# Patient Record
Sex: Male | Born: 1955 | Race: Black or African American | Hispanic: No | Marital: Single | Smoking: Current some day smoker
Health system: Southern US, Community
[De-identification: ages and names within clinical notes are randomized; demographics above are authoritative.]

## PROBLEM LIST (undated history)

## (undated) ENCOUNTER — Emergency Department (HOSPITAL_COMMUNITY): Admission: EM | Disposition: A | Discharge status: Other Acute Inpatient Hospital | Payer: Medicaid Other

## (undated) DIAGNOSIS — F1410 Cocaine abuse, uncomplicated: Secondary | ICD-10-CM

## (undated) DIAGNOSIS — I2510 Atherosclerotic heart disease of native coronary artery without angina pectoris: Secondary | ICD-10-CM

## (undated) DIAGNOSIS — Z720 Tobacco use: Secondary | ICD-10-CM

## (undated) DIAGNOSIS — C801 Malignant (primary) neoplasm, unspecified: Secondary | ICD-10-CM

## (undated) DIAGNOSIS — I255 Ischemic cardiomyopathy: Secondary | ICD-10-CM

## (undated) HISTORY — PX: CARDIAC SURGERY: SHX584

## (undated) HISTORY — PX: BYPASS GRAFT: SHX909

---

## 1999-02-25 ENCOUNTER — Encounter: Payer: Self-pay | Admitting: Internal Medicine

## 1999-02-25 ENCOUNTER — Ambulatory Visit (HOSPITAL_COMMUNITY): Admission: RE | Admit: 1999-02-25 | Discharge: 1999-02-25 | Payer: Self-pay | Admitting: Internal Medicine

## 1999-04-09 ENCOUNTER — Ambulatory Visit (HOSPITAL_COMMUNITY): Admission: RE | Admit: 1999-04-09 | Discharge: 1999-04-09 | Payer: Self-pay | Admitting: *Deleted

## 2003-01-16 ENCOUNTER — Ambulatory Visit (HOSPITAL_COMMUNITY): Admission: RE | Admit: 2003-01-16 | Discharge: 2003-01-16 | Payer: Self-pay | Admitting: Internal Medicine

## 2003-01-16 ENCOUNTER — Encounter: Payer: Self-pay | Admitting: Internal Medicine

## 2004-02-01 ENCOUNTER — Ambulatory Visit: Payer: Self-pay | Admitting: Nurse Practitioner

## 2004-02-29 ENCOUNTER — Ambulatory Visit: Payer: Self-pay | Admitting: Nurse Practitioner

## 2004-03-16 ENCOUNTER — Ambulatory Visit: Payer: Self-pay | Admitting: Nurse Practitioner

## 2004-12-19 ENCOUNTER — Ambulatory Visit: Payer: Self-pay | Admitting: Nurse Practitioner

## 2006-04-23 ENCOUNTER — Ambulatory Visit: Payer: Self-pay | Admitting: Nurse Practitioner

## 2007-05-17 ENCOUNTER — Ambulatory Visit: Payer: Self-pay | Admitting: Internal Medicine

## 2007-07-10 ENCOUNTER — Ambulatory Visit: Payer: Self-pay | Admitting: Internal Medicine

## 2007-12-09 ENCOUNTER — Ambulatory Visit: Payer: Self-pay | Admitting: Internal Medicine

## 2007-12-09 LAB — CONVERTED CEMR LAB
ALT: 11 units/L (ref 0–53)
BUN: 14 mg/dL (ref 6–23)
CO2: 24 meq/L (ref 19–32)
Calcium: 9.2 mg/dL (ref 8.4–10.5)
Chloride: 105 meq/L (ref 96–112)
Cholesterol: 214 mg/dL — ABNORMAL HIGH (ref 0–200)
Cocaine Metabolites: POSITIVE — AB
Creatinine, Ser: 1.12 mg/dL (ref 0.40–1.50)
Creatinine,U: 187.3 mg/dL
Glucose, Bld: 85 mg/dL (ref 70–99)
Hemoglobin: 14.3 g/dL (ref 13.0–17.0)
Lymphocytes Relative: 41 % (ref 12–46)
Lymphs Abs: 1.1 10*3/uL (ref 0.7–4.0)
Monocytes Absolute: 0.3 10*3/uL (ref 0.1–1.0)
Monocytes Relative: 10 % (ref 3–12)
Neutro Abs: 1.3 10*3/uL — ABNORMAL LOW (ref 1.7–7.7)
Opiate Screen, Urine: NEGATIVE
Phencyclidine (PCP): NEGATIVE
RBC: 4.6 M/uL (ref 4.22–5.81)
Total CHOL/HDL Ratio: 2.9

## 2007-12-18 ENCOUNTER — Ambulatory Visit: Payer: Self-pay | Admitting: Internal Medicine

## 2008-01-01 LAB — COMPREHENSIVE METABOLIC PANEL
ALT: 10 U/L (ref 0–53)
AST: 12 U/L (ref 0–37)
Albumin: 4 g/dL (ref 3.5–5.2)
Alkaline Phosphatase: 68 U/L (ref 39–117)
BUN: 11 mg/dL (ref 6–23)
CO2: 25 mEq/L (ref 19–32)
Calcium: 9.4 mg/dL (ref 8.4–10.5)
Chloride: 107 mEq/L (ref 96–112)
Creatinine, Ser: 0.97 mg/dL (ref 0.40–1.50)
Glucose, Bld: 104 mg/dL — ABNORMAL HIGH (ref 70–99)
Potassium: 4 mEq/L (ref 3.5–5.3)
Sodium: 141 mEq/L (ref 135–145)
Total Bilirubin: 0.5 mg/dL (ref 0.3–1.2)
Total Protein: 6.6 g/dL (ref 6.0–8.3)

## 2008-01-01 LAB — CBC WITH DIFFERENTIAL/PLATELET
BASO%: 0.5 % (ref 0.0–2.0)
Basophils Absolute: 0 10*3/uL (ref 0.0–0.1)
EOS%: 3 % (ref 0.0–7.0)
Eosinophils Absolute: 0.1 10*3/uL (ref 0.0–0.5)
HCT: 38.7 % (ref 38.7–49.9)
HGB: 13.4 g/dL (ref 13.0–17.1)
LYMPH%: 33.9 % (ref 14.0–48.0)
MCH: 32.8 pg (ref 28.0–33.4)
MCHC: 34.7 g/dL (ref 32.0–35.9)
MCV: 94.5 fL (ref 81.6–98.0)
MONO#: 0.2 10*3/uL (ref 0.1–0.9)
MONO%: 7 % (ref 0.0–13.0)
NEUT#: 1.8 10*3/uL (ref 1.5–6.5)
NEUT%: 55.6 % (ref 40.0–75.0)
Platelets: 200 10*3/uL (ref 145–400)
RBC: 4.1 10*6/uL — ABNORMAL LOW (ref 4.20–5.71)
RDW: 13.9 % (ref 11.2–14.6)
WBC: 3.3 10*3/uL — ABNORMAL LOW (ref 4.0–10.0)
lymph#: 1.1 10*3/uL (ref 0.9–3.3)

## 2008-01-01 LAB — CHCC SMEAR

## 2008-01-01 LAB — LACTATE DEHYDROGENASE: LDH: 121 U/L (ref 94–250)

## 2008-02-07 ENCOUNTER — Ambulatory Visit: Payer: Self-pay | Admitting: Internal Medicine

## 2008-04-03 ENCOUNTER — Ambulatory Visit: Payer: Self-pay | Admitting: Internal Medicine

## 2008-04-14 ENCOUNTER — Ambulatory Visit: Payer: Self-pay | Admitting: Internal Medicine

## 2008-04-14 ENCOUNTER — Encounter (INDEPENDENT_AMBULATORY_CARE_PROVIDER_SITE_OTHER): Payer: Self-pay | Admitting: Internal Medicine

## 2008-04-14 LAB — CONVERTED CEMR LAB
Alkaline Phosphatase: 65 units/L (ref 39–117)
Indirect Bilirubin: 0.4 mg/dL (ref 0.0–0.9)
Total Bilirubin: 0.5 mg/dL (ref 0.3–1.2)
Total Protein: 7.3 g/dL (ref 6.0–8.3)

## 2008-04-21 ENCOUNTER — Ambulatory Visit: Payer: Self-pay | Admitting: Cardiology

## 2008-04-21 LAB — CONVERTED CEMR LAB
BUN: 15 mg/dL (ref 6–23)
Basophils Relative: 0.5 % (ref 0.0–3.0)
Creatinine, Ser: 0.8 mg/dL (ref 0.4–1.5)
Eosinophils Absolute: 0.1 10*3/uL (ref 0.0–0.7)
Eosinophils Relative: 2.6 % (ref 0.0–5.0)
GFR calc Af Amer: 131 mL/min
GFR calc non Af Amer: 108 mL/min
HCT: 41.5 % (ref 39.0–52.0)
Hemoglobin: 14.6 g/dL (ref 13.0–17.0)
MCV: 95 fL (ref 78.0–100.0)
Monocytes Absolute: 0.3 10*3/uL (ref 0.1–1.0)
Neutro Abs: 2.1 10*3/uL (ref 1.4–7.7)
RBC: 4.37 M/uL (ref 4.22–5.81)
WBC: 4.1 10*3/uL — ABNORMAL LOW (ref 4.5–10.5)

## 2008-04-29 ENCOUNTER — Ambulatory Visit: Payer: Self-pay | Admitting: Vascular Surgery

## 2008-04-29 ENCOUNTER — Encounter: Payer: Self-pay | Admitting: Thoracic Surgery (Cardiothoracic Vascular Surgery)

## 2008-04-29 ENCOUNTER — Inpatient Hospital Stay (HOSPITAL_COMMUNITY): Admission: AD | Admit: 2008-04-29 | Discharge: 2008-05-07 | Payer: Self-pay | Admitting: Cardiology

## 2008-04-29 ENCOUNTER — Ambulatory Visit: Payer: Self-pay | Admitting: Cardiology

## 2008-04-29 ENCOUNTER — Ambulatory Visit: Payer: Self-pay | Admitting: Thoracic Surgery (Cardiothoracic Vascular Surgery)

## 2008-04-30 DIAGNOSIS — I25810 Atherosclerosis of coronary artery bypass graft(s) without angina pectoris: Secondary | ICD-10-CM

## 2008-05-28 ENCOUNTER — Ambulatory Visit: Payer: Self-pay | Admitting: Thoracic Surgery (Cardiothoracic Vascular Surgery)

## 2008-05-28 ENCOUNTER — Encounter
Admission: RE | Admit: 2008-05-28 | Discharge: 2008-05-28 | Payer: Self-pay | Admitting: Thoracic Surgery (Cardiothoracic Vascular Surgery)

## 2008-06-16 DIAGNOSIS — F17200 Nicotine dependence, unspecified, uncomplicated: Secondary | ICD-10-CM | POA: Insufficient documentation

## 2008-06-16 DIAGNOSIS — F1210 Cannabis abuse, uncomplicated: Secondary | ICD-10-CM | POA: Insufficient documentation

## 2008-06-16 DIAGNOSIS — E785 Hyperlipidemia, unspecified: Secondary | ICD-10-CM | POA: Insufficient documentation

## 2008-06-16 DIAGNOSIS — F1410 Cocaine abuse, uncomplicated: Secondary | ICD-10-CM

## 2008-08-13 ENCOUNTER — Ambulatory Visit: Payer: Self-pay | Admitting: Internal Medicine

## 2008-09-11 ENCOUNTER — Telehealth (INDEPENDENT_AMBULATORY_CARE_PROVIDER_SITE_OTHER): Payer: Self-pay | Admitting: *Deleted

## 2008-10-14 ENCOUNTER — Encounter (INDEPENDENT_AMBULATORY_CARE_PROVIDER_SITE_OTHER): Payer: Self-pay | Admitting: *Deleted

## 2009-01-19 ENCOUNTER — Ambulatory Visit: Payer: Self-pay | Admitting: Internal Medicine

## 2009-01-19 ENCOUNTER — Encounter (INDEPENDENT_AMBULATORY_CARE_PROVIDER_SITE_OTHER): Payer: Self-pay | Admitting: Internal Medicine

## 2009-01-19 LAB — CONVERTED CEMR LAB
AST: 12 units/L (ref 0–37)
Albumin: 4.1 g/dL (ref 3.5–5.2)
Alkaline Phosphatase: 60 units/L (ref 39–117)
BUN: 15 mg/dL (ref 6–23)
Basophils Absolute: 0 10*3/uL (ref 0.0–0.1)
Basophils Relative: 1 % (ref 0–1)
Eosinophils Relative: 2 % (ref 0–5)
Lymphocytes Relative: 37 % (ref 12–46)
Neutro Abs: 1.6 10*3/uL — ABNORMAL LOW (ref 1.7–7.7)
Platelets: 213 10*3/uL (ref 150–400)
Potassium: 4.5 meq/L (ref 3.5–5.3)
RDW: 13.5 % (ref 11.5–15.5)
Sodium: 141 meq/L (ref 135–145)
Total Bilirubin: 0.5 mg/dL (ref 0.3–1.2)

## 2009-02-05 ENCOUNTER — Ambulatory Visit: Payer: Self-pay | Admitting: Internal Medicine

## 2009-02-12 ENCOUNTER — Ambulatory Visit: Payer: Self-pay | Admitting: Internal Medicine

## 2009-02-15 ENCOUNTER — Encounter (INDEPENDENT_AMBULATORY_CARE_PROVIDER_SITE_OTHER): Payer: Self-pay | Admitting: *Deleted

## 2010-03-15 ENCOUNTER — Encounter (INDEPENDENT_AMBULATORY_CARE_PROVIDER_SITE_OTHER): Payer: Self-pay | Admitting: *Deleted

## 2010-03-15 LAB — CONVERTED CEMR LAB
Albumin: 4.2 g/dL (ref 3.5–5.2)
Alkaline Phosphatase: 61 units/L (ref 39–117)
BUN: 12 mg/dL (ref 6–23)
CO2: 27 meq/L (ref 19–32)
Calcium: 9.8 mg/dL (ref 8.4–10.5)
Chloride: 106 meq/L (ref 96–112)
Cholesterol: 240 mg/dL — ABNORMAL HIGH (ref 0–200)
Glucose, Bld: 92 mg/dL (ref 70–99)
HDL: 72 mg/dL (ref >39)
Potassium: 4.2 meq/L (ref 3.5–5.3)
Total Protein: 7 g/dL (ref 6.0–8.3)
Triglycerides: 97 mg/dL (ref <150)

## 2010-07-12 LAB — POCT I-STAT 4, (NA,K, GLUC, HGB,HCT)
Glucose, Bld: 103 mg/dL — ABNORMAL HIGH (ref 70–99)
Glucose, Bld: 107 mg/dL — ABNORMAL HIGH (ref 70–99)
Glucose, Bld: 117 mg/dL — ABNORMAL HIGH (ref 70–99)
Glucose, Bld: 98 mg/dL (ref 70–99)
Glucose, Bld: 98 mg/dL (ref 70–99)
HCT: 24 % — ABNORMAL LOW (ref 39.0–52.0)
HCT: 26 % — ABNORMAL LOW (ref 39.0–52.0)
HCT: 33 % — ABNORMAL LOW (ref 39.0–52.0)
HCT: 34 % — ABNORMAL LOW (ref 39.0–52.0)
Hemoglobin: 11.2 g/dL — ABNORMAL LOW (ref 13.0–17.0)
Hemoglobin: 8.8 g/dL — ABNORMAL LOW (ref 13.0–17.0)
Potassium: 3.7 mEq/L (ref 3.5–5.1)
Potassium: 4.5 mEq/L (ref 3.5–5.1)
Potassium: 5 mEq/L (ref 3.5–5.1)
Sodium: 140 mEq/L (ref 135–145)
Sodium: 142 mEq/L (ref 135–145)

## 2010-07-12 LAB — POCT I-STAT 3, ART BLOOD GAS (G3+)
Acid-Base Excess: 1 mmol/L (ref 0.0–2.0)
Acid-base deficit: 2 mmol/L (ref 0.0–2.0)
Acid-base deficit: 3 mmol/L — ABNORMAL HIGH (ref 0.0–2.0)
Acid-base deficit: 5 mmol/L — ABNORMAL HIGH (ref 0.0–2.0)
Bicarbonate: 21.8 mEq/L (ref 20.0–24.0)
Bicarbonate: 23.5 mEq/L (ref 20.0–24.0)
O2 Saturation: 100 %
O2 Saturation: 98 %
O2 Saturation: 99 %
Patient temperature: 36.2
Patient temperature: 37
Patient temperature: 37.6
TCO2: 22 mmol/L (ref 0–100)
TCO2: 23 mmol/L (ref 0–100)
TCO2: 27 mmol/L (ref 0–100)
pCO2 arterial: 42.2 mmHg (ref 35.0–45.0)
pO2, Arterial: 158 mmHg — ABNORMAL HIGH (ref 80.0–100.0)
pO2, Arterial: 277 mmHg — ABNORMAL HIGH (ref 80.0–100.0)

## 2010-07-12 LAB — MAGNESIUM
Magnesium: 2.2 mg/dL (ref 1.5–2.5)
Magnesium: 2.3 mg/dL (ref 1.5–2.5)

## 2010-07-12 LAB — BASIC METABOLIC PANEL
BUN: 12 mg/dL (ref 6–23)
BUN: 17 mg/dL (ref 6–23)
BUN: 8 mg/dL (ref 6–23)
CO2: 28 mEq/L (ref 19–32)
Calcium: 8 mg/dL — ABNORMAL LOW (ref 8.4–10.5)
Calcium: 8.4 mg/dL (ref 8.4–10.5)
Calcium: 8.8 mg/dL (ref 8.4–10.5)
Calcium: 9.1 mg/dL (ref 8.4–10.5)
Chloride: 101 mEq/L (ref 96–112)
Chloride: 110 mEq/L (ref 96–112)
Creatinine, Ser: 0.8 mg/dL (ref 0.4–1.5)
Creatinine, Ser: 0.83 mg/dL (ref 0.4–1.5)
Creatinine, Ser: 0.84 mg/dL (ref 0.4–1.5)
Creatinine, Ser: 0.86 mg/dL (ref 0.4–1.5)
Creatinine, Ser: 1.02 mg/dL (ref 0.4–1.5)
GFR calc Af Amer: >60 mL/min (ref >60)
GFR calc Af Amer: >60 mL/min (ref >60)
GFR calc Af Amer: >60 mL/min (ref >60)
GFR calc non Af Amer: >60 mL/min (ref >60)
GFR calc non Af Amer: >60 mL/min (ref >60)
GFR calc non Af Amer: >60 mL/min (ref >60)
GFR calc non Af Amer: >60 mL/min (ref >60)
Glucose, Bld: 102 mg/dL — ABNORMAL HIGH (ref 70–99)
Glucose, Bld: 116 mg/dL — ABNORMAL HIGH (ref 70–99)
Potassium: 3.5 mEq/L (ref 3.5–5.1)
Potassium: 3.6 mEq/L (ref 3.5–5.1)
Sodium: 138 mEq/L (ref 135–145)
Sodium: 139 mEq/L (ref 135–145)

## 2010-07-12 LAB — COMPREHENSIVE METABOLIC PANEL
AST: 16 U/L (ref 0–37)
Albumin: 3 g/dL — ABNORMAL LOW (ref 3.5–5.2)
Alkaline Phosphatase: 54 U/L (ref 39–117)
BUN: 17 mg/dL (ref 6–23)
CO2: 26 mEq/L (ref 19–32)
Chloride: 106 mEq/L (ref 96–112)
Creatinine, Ser: 1.02 mg/dL (ref 0.4–1.5)
GFR calc Af Amer: >60 mL/min (ref >60)
GFR calc non Af Amer: >60 mL/min (ref >60)
Potassium: 3.5 mEq/L (ref 3.5–5.1)
Total Bilirubin: 0.7 mg/dL (ref 0.3–1.2)

## 2010-07-12 LAB — CBC
HCT: 31.3 % — ABNORMAL LOW (ref 39.0–52.0)
HCT: 32 % — ABNORMAL LOW (ref 39.0–52.0)
HCT: 35.5 % — ABNORMAL LOW (ref 39.0–52.0)
HCT: 36.1 % — ABNORMAL LOW (ref 39.0–52.0)
Hemoglobin: 10.5 g/dL — ABNORMAL LOW (ref 13.0–17.0)
Hemoglobin: 11.1 g/dL — ABNORMAL LOW (ref 13.0–17.0)
Hemoglobin: 12.1 g/dL — ABNORMAL LOW (ref 13.0–17.0)
MCHC: 34 g/dL (ref 30.0–36.0)
MCHC: 34.5 g/dL (ref 30.0–36.0)
MCHC: 34.6 g/dL (ref 30.0–36.0)
MCV: 94.9 fL (ref 78.0–100.0)
MCV: 96 fL (ref 78.0–100.0)
MCV: 96.3 fL (ref 78.0–100.0)
MCV: 96.4 fL (ref 78.0–100.0)
MCV: 96.4 fL (ref 78.0–100.0)
Platelets: 104 10*3/uL — ABNORMAL LOW (ref 150–400)
Platelets: 153 10*3/uL (ref 150–400)
Platelets: 94 10*3/uL — ABNORMAL LOW (ref 150–400)
Platelets: 96 10*3/uL — ABNORMAL LOW (ref 150–400)
RBC: 3.11 MIL/uL — ABNORMAL LOW (ref 4.22–5.81)
RBC: 3.21 MIL/uL — ABNORMAL LOW (ref 4.22–5.81)
RBC: 3.25 MIL/uL — ABNORMAL LOW (ref 4.22–5.81)
RBC: 3.32 MIL/uL — ABNORMAL LOW (ref 4.22–5.81)
RBC: 3.68 MIL/uL — ABNORMAL LOW (ref 4.22–5.81)
RBC: 3.81 MIL/uL — ABNORMAL LOW (ref 4.22–5.81)
RDW: 13 % (ref 11.5–15.5)
RDW: 13.1 % (ref 11.5–15.5)
RDW: 13.2 % (ref 11.5–15.5)
RDW: 13.3 % (ref 11.5–15.5)
WBC: 3.7 10*3/uL — ABNORMAL LOW (ref 4.0–10.5)
WBC: 7.7 10*3/uL (ref 4.0–10.5)
WBC: 8.3 10*3/uL (ref 4.0–10.5)
WBC: 8.7 10*3/uL (ref 4.0–10.5)

## 2010-07-12 LAB — BLOOD GAS, ARTERIAL
Acid-Base Excess: 0.6 mmol/L (ref 0.0–2.0)
Bicarbonate: 24.5 mEq/L — ABNORMAL HIGH (ref 20.0–24.0)
TCO2: 25.7 mmol/L (ref 0–100)
pCO2 arterial: 38.5 mmHg (ref 35.0–45.0)
pH, Arterial: 7.42 (ref 7.350–7.450)
pO2, Arterial: 81.1 mmHg (ref 80.0–100.0)

## 2010-07-12 LAB — TYPE AND SCREEN: Antibody Screen: NEGATIVE

## 2010-07-12 LAB — POCT I-STAT, CHEM 8
Calcium, Ion: 1.18 mmol/L (ref 1.12–1.32)
HCT: 32 % — ABNORMAL LOW (ref 39.0–52.0)
Hemoglobin: 10.9 g/dL — ABNORMAL LOW (ref 13.0–17.0)
Sodium: 139 mEq/L (ref 135–145)
TCO2: 28 mmol/L (ref 0–100)

## 2010-07-12 LAB — POCT I-STAT 3, VENOUS BLOOD GAS (G3P V)
TCO2: 25 mmol/L (ref 0–100)
pCO2, Ven: 32.2 mmHg — ABNORMAL LOW (ref 45.0–50.0)
pH, Ven: 7.452 — ABNORMAL HIGH (ref 7.250–7.300)
pO2, Ven: 35 mmHg (ref 30.0–45.0)

## 2010-07-12 LAB — GLUCOSE, CAPILLARY
Glucose-Capillary: 100 mg/dL — ABNORMAL HIGH (ref 70–99)
Glucose-Capillary: 103 mg/dL — ABNORMAL HIGH (ref 70–99)
Glucose-Capillary: 104 mg/dL — ABNORMAL HIGH (ref 70–99)
Glucose-Capillary: 108 mg/dL — ABNORMAL HIGH (ref 70–99)
Glucose-Capillary: 110 mg/dL — ABNORMAL HIGH (ref 70–99)
Glucose-Capillary: 111 mg/dL — ABNORMAL HIGH (ref 70–99)
Glucose-Capillary: 116 mg/dL — ABNORMAL HIGH (ref 70–99)
Glucose-Capillary: 83 mg/dL (ref 70–99)
Glucose-Capillary: 99 mg/dL (ref 70–99)

## 2010-07-12 LAB — ABO/RH: ABO/RH(D): O POS

## 2010-07-12 LAB — CREATININE, SERUM
Creatinine, Ser: 0.87 mg/dL (ref 0.4–1.5)
GFR calc Af Amer: >60 mL/min (ref >60)
GFR calc Af Amer: >60 mL/min (ref >60)
GFR calc non Af Amer: >60 mL/min (ref >60)

## 2010-07-12 LAB — URINALYSIS, ROUTINE W REFLEX MICROSCOPIC
Bilirubin Urine: NEGATIVE
Hgb urine dipstick: NEGATIVE
Ketones, ur: NEGATIVE mg/dL
Specific Gravity, Urine: 1.033 — ABNORMAL HIGH (ref 1.005–1.030)
pH: 6.5 (ref 5.0–8.0)

## 2010-07-12 LAB — PLATELET COUNT: Platelets: 111 10*3/uL — ABNORMAL LOW (ref 150–400)

## 2010-07-12 LAB — APTT: aPTT: 39 seconds — ABNORMAL HIGH (ref 24–37)

## 2010-08-09 NOTE — Op Note (Signed)
NAMECHARELS, STAMBAUGH               ACCOUNT NO.:  [id number]   MEDICAL RECORD NO.:  [id number]          PATIENT TYPE:  INP   LOCATION:  2316                         FACILITY:  [facility]   PHYSICIAN:  Salvatore Decent. Dorris Fetch, M.D.DATE OF BIRTH:  April 12, 1955   DATE OF PROCEDURE:  04/30/2008  DATE OF DISCHARGE:                               OPERATIVE REPORT   PREOPERATIVE DIAGNOSIS:  Severe two-vessel coronary disease, status post  recent myocardial infarction.   POSTOPERATIVE DIAGNOSIS:  Severe two-vessel coronary disease, status  post recent myocardial infarction.   PROCEDURE:  Median sternotomy, extracorporeal circulation, coronary  artery bypass grafting x4 (left internal mammary artery to left anterior  descending, saphenous vein graft to second diagonal, sequential  saphenous vein graft to posterior descending and posterolateral), and  endoscopic vein harvest left leg.   SURGEON:  Salvatore Decent. Dorris Fetch, MD   ASSISTANT:  Rowe Clack, PA-C   ANESTHESIA:  General.   FINDINGS:  Diffusely diseased right coronary artery and proximal mid  LAD, good-quality targets, good-quality conduits, left ventricular  hypertrophy, severe emphysematous changes of both lungs.   CLINICAL NOTE:  Mr. Abdalla is a 55 year old gentleman with  multisubstance abuse, who has a longstanding history of exertional chest  pain.  Approximately 2 weeks prior to admission, he had a severe  prolonged episode and continuing angina following that.  It was felt  that he had an  myocardial infarction.  At cardiac catheterization, his  LAD was totally occluded, did fill via some collaterals.  There also was  diffuse and heavy disease in the right coronary system.  The circumflex  was relatively unaffected.  The patient had significant left ventricular  dysfunction with ejection fraction approximately of 30% with anterior  and apical akinesis.  The patient was advised to undergo coronary artery  bypass grafting.  The  indications, risks, benefits, and alternatives  were discussed in detail with the patient and also discussed with his  niece and nephew by telephone.  He understood the risks, accepted them,  and agreed to proceed.   OPERATIVE NOTE:  Mr. Loh was brought to the Preop Holding Area on  April 30, 2008.  The lines were placed by the Anesthesia Service under  the direction of Dr. Claybon Jabs for monitoring arterial, central  venous, and pulmonary arterial pressures.  Intravenous antibiotics were  administered.  He was taken to the operating room, anesthetized, and  intubated.  A Foley catheter was placed.  The chest, abdomen, and legs  were prepped and draped in a usual fashion.  A median sternotomy was  performed and the left internal mammary artery was harvested using the  standard technique.  It was a good-quality vessel.  Simultaneously, an  incision was made in the medial aspect of the left leg at the level of  the knee.  The greater saphenous vein was harvested from the thigh  endoscopically.  It likewise was a good-quality vessel.  A 2000 units of  heparin was administered during the vessel harvest.  After the vessels  had been harvested, the remaining of the full heparin dose was  administered prior to opening the pericardium.   The pericardium was opened.  There was a mild pericardial effusion.  The  aorta was cannulated and sutured with 2-0 Ethilon pledgeted purse-string  sutures after inspecting and finding no evidence of atherosclerotic  changes.  A dual-stage venous cannula was placed via purse-string suture  in the right atrial appendage.  After confirming adequate  anticoagulation with ACT measurement, cardiopulmonary bypass was  instituted and flows were maintained per protocol throughout the  procedure.  The coronary arteries were inspected and anastomotic sites  were chosen.  The conduits were inspected and cut to length.  A foam pad  was placed in the pericardium to  protect left phrenic nerve.  A  temperature probe was placed in the myocardial septum and a cardioplegic  cannula was placed in the ascending aorta.   The aorta was crossclamped.  The left ventricle was emptied via the  aortic root vent.  Cardiac arrest was then achieved with a combination  of cold antegrade blood cardioplegia and topical iced saline.  One liter  of cardioplegia was administered.  The myocardial septal temperature was  10 degrees Celsius.  The following distal anastomoses were performed.   First, a reversed saphenous vein graft was placed end-to-side to the  second diagonal branch of the LAD.  The LAD was totally occluded in its  midportion, reconstituted at the level of the takeoff of the second  diagonal, but there was heavy disease in the LAD which would compromise  flow into this large diagonal branch.  The vessel was a 1.5-mm target of  good quality.  The vein was of good quality.  The anastomosis was  performed with a running 7-0 Prolene suture.  Each anastomosis was  probed proximally and distally at its completion to ensure patency.  Cardioplegia was administered down the each vein graft at its  completion.  There was good flow and good hemostasis.   Next, a reverse saphenous vein graft was placed sequentially to the  posterior descending and posterolateral branches of the right coronary.  The right coronary was diffusely diseased.  There also was about a 70%  stenosis at takeoff of the posterior descending.  A side-to-side  anastomosis was performed to the posterior descending.  This was a 1.5-  mm target of good quality.  An end-to-side anastomosis was performed to  the first posterolateral branch, which was likewise a 1.5-mm good-  quality target.  Both anastomoses were performed with running 7-0  Prolene sutures.  There was good flow and good hemostasis.   Next, the left internal mammary artery was brought through a window in  the pericardium.  The distal  end was beveled and was anastomosed end-to-  side to the LAD just below the takeoff of the second diagonal.  The  vessel was a 1.5-mm good-quality target at this site.  The mammary was  1.5-mm good-quality conduit.  It did have good flow.  The anastomosis  was performed end-to-side with a running 8-0 Prolene suture.  At the  completion anastomosis, the bulldog clamp was removed.  There was septal  rewarming.  The bulldog wraps were placed and the mammary pedicle was  tacked at the epicardial surface of the heart with 6-0 Prolene sutures.   Additional cardioplegia was administered down the aortic root and the  vein grafts.  The vein grafts then were cut to length.  The cardioplegia  cannulas were removed from the ascending aorta and the proximal vein  graft  anastomoses were performed to 4.5 mm punch aortotomies while under  crossclamp.  This was done with running 6-0 Prolene sutures.  At the  completion of final proximal anastomosis, lidocaine was administered.  The patient was placed in Trendelenburg position.  The aortic root was  de-aired and the aortic crossclamp was removed.  The total crossclamp  time was 62 minutes.   The patient required a single defibrillation with 20 joules.  He  remained in sinus rhythm thereafter.  While the patient was being  rewarmed, all proximal and distal anastomoses were inspected for  hemostasis.  The cardial pacing wires were placed on the right ventricle  and right atrium, and the patient rewarmed to a core temperature of 37  degrees Celsius.  He was weaned from cardiopulmonary bypass on the first  attempt.  He was in sinus rhythm and on no inotropic support.  Total  bypass time was 89 minutes.  Initial cardiac index was greater than 2  liters per minute per meter squared.  The patient remained  hemodynamically stable throughout the post-bypass period.   A test dose of protamine was administered and was well tolerated.  The  atrial and aortic  cannulae were removed.  The remainder of protamine was  administered without incident.  The chest was irrigated with warm  saline.  Hemostasis was achieved.  The pericardium was reapproximated  with interrupted 3-0 silk sutures, came together easily without tension  or kinking of the underlying grafts.  A left pleural and single  mediastinal chest tubes were placed through the separate subcostal  incisions.  The sternum was closed with interrupted heavy gauge  stainless steel wires.  The pectoralis fascia, subcutaneous tissue, and  skin were closed in standard fashion.  All sponge, needle, and  instrument counts were correct at the end of the procedure.  The patient  was taken from the operating room to the Surgical Intensive Care Unit in  fair condition.      Salvatore Decent Dorris Fetch, M.D.  Electronically Signed     SCH/MEDQ  D:  04/30/2008  T:  05/01/2008  Job:  [id number]   cc:   Marca Ancona, MD  Dineen Kid. Reche Dixon, M.D.

## 2010-08-09 NOTE — Consult Note (Signed)
NAMEREMBERT, BROWE               ACCOUNT NO.:  [id number]   MEDICAL RECORD NO.:  [id number]          PATIENT TYPE:  INP   LOCATION:  2019                         FACILITY:  [facility]   PHYSICIAN:  Salvatore Decent. Dorris Fetch, M.D.DATE OF BIRTH:  1955/06/08   DATE OF CONSULTATION:  04/29/2008  DATE OF DISCHARGE:                                 CONSULTATION   [name] is a 55 year old gentleman who presents with a chief  complaint of chest pain.  He has a past medical history significant for  hyperlipidemia, smoking, and cocaine abuse.  He also has a family  history of coronary artery disease.  He has been having exertional chest  discomfort for about a year, although it only really became noticeable  over the past 6 months.  He will get tightness if he walks up an  incline, riding on his bicycle, or walking about a quarter of mile on  flat ground.  He had cut back on his exertion.  He did not realize this  was his heart and did not seek medical attention.  About 2 weeks ago, he  had a very severe and prolonged episode of waxing and waning pain,  lasted a considerable period of time.  The pain has eventually resolved  spontaneously.  Since that time, he has had some shortness of breath  with exertion as well.  He does have a history of cocaine abuse and has  had a positive urine drug screen within the past month.  Today, he  underwent cardiac catheterization where he was found to have severe two-  vessel disease.  He had subtotal occlusion of his mid LAD, does fill via  collaterals with TIMI 1 flow.  He has diffusely diseased right coronary  as well as a 60-70% ostial PD  stenosis.  He did not have any  significant disease in the circumflex distribution.  Ejection fraction  was 30% with his anterior apical akinesis.  His LVEDP was only 9 mmHg.  The patient currently is pain free.   PAST MEDICAL HISTORY:  Significant for,  1. Hyperlipidemia.  2. Gastroesophageal reflux.  3. Cocaine  abuse.  4. Marijuana abuse.  5. Tobacco abuse.  6. Question mini stroke manifested by right upper extremity numbness.   MEDICATIONS ON ADMISSION:  1. Imdur 30 mg daily.  2. Aspirin 81 mg daily.  3. Metoprolol 25 mg b.i.d.  4. Simvastatin 40 mg daily.   He has no known drug allergies.   FAMILY HISTORY:  Significant for coronary artery disease in his mother  in her 3s.   SOCIAL HISTORY:  He is essentially homeless.  He lives in a shelter.  He  does have Medicaid.  He smokes about a pack of cigarettes a week.  He  was previously a heavy smoker.  As noted, he has had a positive tox  screen for cocaine within the past month.   REVIEW OF SYSTEMS:  See HPI.  No history of excessive bleeding or  bruising.  Numbness in his right upper extremity, but no motor  dysfunction, no effect on his gait.  No recent  stroke or TIA symptoms.  Denies paroxysmal nocturnal dyspnea, orthopnea, or peripheral edema.  Denies any rest cardiac pain since his episode 2 weeks ago.  All other  systems are negative.   PHYSICAL EXAMINATION:  GENERAL:  [name] is a 55 year old African  American male in no acute distress.  VITAL SIGNS:  Blood pressure is 117/64, pulse is 60, respirations are  20, and his oxygen saturations 99% on room air.  NEUROLOGIC:  He is alert and answers questions appropriately.  He is  oriented with no focal deficits.  HEENT:  Unremarkable.  NECK:  Supple without thyromegaly, adenopathy, or bruits.  CARDIAC:  Regular rate and rhythm.  Normal S1 and S2.  No rubs, murmurs,  or gallops.  LUNGS:  Clear.  ABDOMEN:  Soft and nontender.  EXTREMITIES:  Without clubbing, cyanosis, or edema.  He has an  indeterminate Allen test on the left arm.  He is right handed.   LABORATORY DATA:  Cardiac catheterization as noted.  There is no EKG on  the chart.  His sodium is 140, potassium 4.0, BUN and creatinine are 15  and 0.8, and glucose 88.  White count 4.1, hematocrit 41.5, and  platelets 242.   His PT is 10.6.   IMPRESSION:  [name] is a 55 year old gentleman with multiple  cardiac risk factors who presents approximately 2 weeks after an out-of-  hospital myocardial infarction following a prolonged progressive  exertional angina.  At catheterization, he has severe two-vessel  disease, not amenable to percutaneous intervention.  Coronary artery  bypass graft is indicated for survival, benefit as well as relief of  symptoms.  I have discussed in detail with the patient, although I think  he has relatively limited understanding.  I also discussed the same  issues in the patient's presence with his niece and nephew by telephone.  We discussed the indications, risks, benefits, and alternative  treatments.  They understand the risks of surgery include but not  limited to death, stroke, myocardial infarction, deep vein thrombosis,  pulmonary embolism, bleeding, possible need for transfusions, infection  as well as other organ system dysfunction including respiratory, renal,  or gastrointestinal complications.  The patient accepts these risks and  he agrees to proceed with surgery.  We will plan to proceed with surgery  tomorrow morning first case.      Salvatore Decent Dorris Fetch, M.D.  Electronically Signed     SCH/MEDQ  D:  04/29/2008  T:  04/30/2008  Job:  [id number]   cc:   Marca Ancona, MD  Dineen Kid. Reche Dixon, M.D.

## 2010-08-09 NOTE — Assessment & Plan Note (Signed)
OFFICE VISIT   GERALDINE, TESAR  DOB:  1955-11-03                                        May 28, 2008  CHART #:  [id number]   The patient returns today for followup following recent heart surgery.   The patient is a 55 year old gentleman with history of multi-substance  abuse who presented with exacerbation of longstanding exertional chest  pain.  He likely had an out-of-hospital myocardial infarction.  He  underwent coronary artery bypass grafting x4 on April 30, 2008.  Postoperatively, his course was uncomplicated and he was discharged home  on postoperative day #4.  He subsequently has done well.  He is having  minimal discomfort.  He is still taking about 1 pain pill a day.  His  exercise tolerance is good.  He has not had any anginal-type chest pain.   CURRENT MEDICATIONS:  Imdur 30 mg daily, aspirin 81 mg daily,  simvastatin 40 mg daily, Toprol-XL 25 mg daily.  He is off of the  nicotine patch.  She also takes a multivitamin.   PHYSICAL EXAMINATION:  GENERAL:  The patient is a thin 55 year old  [place] male, in no acute distress.  VITAL SIGNS:  His blood pressure is 95/70, pulse 78, respirations are  18.  His ox saturation is 98% on room air.  LUNGS:  Clear with equal breath sounds.  CARDIAC:  Has regular rate and rhythm.  Normal S1 and S2.  No murmurs,  rubs, or gallops.  Sternum is stable.  Sternal incision is clean, dry,  and intact.  EXTREMITIES:  Leg incisions are healing well.  There is no peripheral  edema.   Chest x-ray shows good aeration of the lungs bilaterally with no  effusions or infiltrates.   IMPRESSION:  The patient is doing very well.  He is about 4 weeks out  from surgery.  He is having minimal discomfort.  His blood pressure is a  little low and he went back on his Imdur, which he really was not  supposed to take when he left the hospital, so we are going ahead and  stop that today.  He will continue with all his  other medications.  He  will follow up.  He has an appointment with [facility] on June 08, 2008.  I would be happy to see him back anytime if I could be of any  further assistance with his care.   Salvatore Decent Dorris Fetch, M.D.  Electronically Signed   SCH/MEDQ  D:  05/28/2008  T:  05/28/2008  Job:  [id number]   cc:   Marca Ancona, MD

## 2010-08-09 NOTE — Cardiovascular Report (Signed)
[name], [name] NO.:  [id number]   MEDICAL RECORD NO.:  [id number]          PATIENT TYPE:  INP   LOCATION:  2019                         FACILITY:  [facility]   PHYSICIAN:  Marca Ancona, MD      DATE OF BIRTH:  08/11/1955   DATE OF PROCEDURE:  DATE OF DISCHARGE:                            CARDIAC CATHETERIZATION   PRIMARY CARE PHYSICIAN:  Larry Kid. Reche Dixon, MD   PROCEDURE:  1. Left heart catheterization.  2. Coronary angiography.  3. Left ventriculography.  4. LIMA angiography.   INDICATION:  Episode of prolonged chest pain about 2 weeks ago as well  as chronic exertional chest pain worrisome for angina in a patient with  history of tobacco abuse and hyperlipidemia.   PROCEDURE NOTE:  After informed consent was obtained, the right groin  was sterilely prepped and draped.  1% lidocaine was used to locally  anesthetize the right groin area.  The right common femoral artery was  entered using Seldinger technique and a 6-French arterial sheath was  placed.  The left coronary artery was engaged using the 6-French  multipurpose catheter.  The right coronary artery was engaged using the  6-French JR4 catheter and the left ventricle was entered using the 6-  French angled pigtail catheter and the subclavian was entered using the  6-French LIMA catheter.  There were no complications.   FINDINGS:  Hemodynamics:  LVEDP 9 mmHg.   Left ventriculography:  The apex and the periapical segments are  akinetic.  EF was estimated to be 30-35% and there was vigorous  contraction of the basal segments.  There was no significant mitral  regurgitation.   Coronary angiography:  The coronary system is right dominant.  The RCA  is a diffusely deceased vessel.  There is a 50% proximal RCA stenosis, a  50% mid RCA stenosis, and a 40% distal RCA stenosis.  There is a 60%-70%  discrete ostial PDA stenosis and the PLV is diffusely diseased mildly.  The left main has no significant  disease.  The circumflex has diffuse  luminal irregularities.  In the proximal LAD, there is a 50% stenosis.  The mid LAD is subtotally occluded chronically.  There is TIMI 1 flow  beyond the occlusion with flow reaching the distal LAD at the apex.  There are good targets for coronary artery bypass.  The LIMA is patent.   ASSESSMENT AND PLAN:  This is a 55 year old with a history of tobacco  abuse, cocaine abuse and an episode of severe chest pain about 3 weeks  ago at rest that was prolonged.  The patient also has chronic exertional  chest pain.  The patient likely had an anterior MI about 3 weeks ago.  The LAD is subtotally occluded in the mid portion with TIMI 1 flow to  the apex.  There are good targets for bypass surgery.  The patient is  not a good drug-eluting stent candidate given questions about medication  compliance.  The apex and the periapical segments on left  ventriculogram are akinetic with EF about 35%.  I do feel that the  patient would benefit from LIMA to the LAD as given his ongoing  exertional angina, he most likely has ischemic viable territory.  I  would recommend a LIMA graft to the LAD plus/minus grafting to the PDA.  We can consider off-pump LIMA.      Marca Ancona, MD  Electronically Signed     DM/MEDQ  D:  04/29/2008  T:  04/30/2008  Job:  [id number]   cc:   [name], M.D.

## 2010-08-09 NOTE — Discharge Summary (Signed)
NAMEJERMAN, [name] NO.:  [id number]   MEDICAL RECORD NO.:  [id number]          PATIENT TYPE:  INP   LOCATION:  2019                         FACILITY:  [facility]   PHYSICIAN:  Doree Fudge, PA DATE OF BIRTH:  July 11, 1955   DATE OF ADMISSION:  04/29/2008  DATE OF DISCHARGE:  05/05/2008                               DISCHARGE SUMMARY   ADDENDUM   In-hospital secondary diagnoses are the same as well as in-hospital  operations and procedures.   BRIEF HOSPITAL COURSE STAY SINCE LAST DICTATION:  Physical Examination:  VITAL SIGNS:  The patient remained afebrile.  Vital signs stable.  O2  sat 91% on 4 L nasal cannula.  Preop weight 72 kg, today's weight 58 kg.  CARDIOVASCULAR:  Sinus tach.  PULMONARY:  Diminished at the bases.  ABDOMEN:  Benign.  EXTREMITIES:  No lower extremity edema.  SKIN:  Sternal and lower extremity wounds continuing to heal.   Provided that the patient remains afebrile and hemodynamically stable  and is weaned off O2, he will be discharged on May 05, 2008.   Changes to medications include the following:  1. Lisinopril 2.5 mg p.o. daily.  2. Coreg 6.25 mg p.o. 2 times daily.  3. Lopressor has been discontinued.      Doree Fudge, PA     DZ/MEDQ  D:  05/04/2008  T:  05/05/2008  Job:  [id number]   cc:   Marca Ancona, MD  Dineen Kid. Reche Dixon, M.D.

## 2010-08-09 NOTE — Assessment & Plan Note (Signed)
[facility]                            CARDIOLOGY OFFICE NOTE   [name], SCALISE                      MRN:          [id number]  DATE:04/21/2008                            DOB:          01/05/1956    PRIMARY CARE PHYSICIAN:  Dineen Kid. Reche Dixon, MD   HISTORY OF PRESENT ILLNESS:  This is a 55 year old with a history of  hyperlipidemia, gastroesophageal reflux disease, smoking, and cocaine  abuse who presents to Cardiology Clinic for evaluation of exertional  chest pain.  The patient tells me that he has been having exertional  chest pain now for 6-7 months.  He says he will get substernal chest  tightness and burning with activity such as walking up a steep hill,  riding on his bicycle, or walking about a quarter of a mile on flat  ground.  He actually has cut back on his level of exertion considerably  to avoid the chest pain.  He says that the chest pain is fairly reliable  with a moderate level of exertion.  He says the pain tends to be  central, substernal, and also actually radiating up towards the right  chest as well.  There is no radiation to the arms or the neck.  About 2  weeks ago, he had a day of very severe chest pain.  It began just before  breakfast while he was sitting in a chair.  He was not exerting himself  at that time and it did resolve eventually, then it came back again  fairly mild during the middle of the day, and it did get worse with any  sort of exertion like walking around and got better with rest and it  eventually resolved.  Finally, after dinner while he was sitting in a  chair, he had a very severe episode of substernal chest burning and  tightness.  He walked up the steps to his room and as he was going up  the steps, the chest pain got considerably worse.  He laid down and the  chest pain eventually resolved; however, and it lasted probably for  about an hour and half total at that time.  Since that time, he has not  had any more episodes of chest pain or tightness; however, he has not  been exerting himself much at all over that period because he is scared  of having a recurrence.  The patient does get some mild shortness of  breath with about the same degree of exertion that brings on his chest  pain.  He continues to smoke about a pack a week.  He does have a  history of cocaine abuse.  He tells me he has not been using cocaine for  a year; however, I did check the last labs from [facility] and there  was a urine drug screen that was positive for cocaine within the last  month.   PAST MEDICAL HISTORY:  1. Gastroesophageal reflux disease.  2. Hyperlipidemia.  3. Chest pain with exertion.  4. Cocaine abuse.  5. Tobacco abuse.   SOCIAL HISTORY:  The patient  is homeless.  He lives in a shelter.  He  has Medicaid and is seen at [facility].  He smokes about a pack a week  now, although he was a heavy smoker before.  He says he has not used  cocaine for over a year; however, his urine drug screen within the last  month was positive for cocaine.  He smokes marijuana occasionally.   FAMILY HISTORY:  The patient still tells me that his mother had a heart  attack at the age of 30 and what else he knows about is his family with  heart disease.   MEDICATIONS:  1. Omeprazole 20 mg daily.  2. Zocor 20 mg daily.  3. He does not take aspirin.   REVIEW OF SYSTEMS:  Negative except as noted in the history present  illness.   EKG was reviewed, shows normal sinus rhythm.  There are anterior T-wave  inversions consistent with ischemia.  There are also pathological Qs in  leads V3 and V4 and I question whether this could be an old anterior MI.   LABORATORY DATA:  Most recent labs were from January 2010.  Urine drug  screen was positive for cocaine.  HIV negative.  HDL 74, triglycerides  50, LDL 130.  TSH normal.  Hematocrit 45.2.  Potassium 4.8, creatinine  1.12.   PHYSICAL EXAMINATION:  VITAL SIGNS:   Blood pressure 117/74, heart rate  60 and regular.  GENERAL:  This is a well-developed male in no apparent distress.  NEUROLOGIC:  Alert and orient x3.  Normal affect.  LUNGS:  Clear to auscultation bilaterally with normal respiratory  effort.  NECK:  There is no JVD.  There is no thyromegaly or thyroid nodule.  CARDIOVASCULAR:  Heart regular.  S1 and S2.  No S3, no S4.  There is no  murmur.  There is no peripheral edema.  There are 2+ posterior tibial  pulses bilaterally.  ABDOMEN:  Soft, nontender.  No hepatosplenomegaly.  Normal bowel sounds.  EXTREMITIES:  No clubbing or cyanosis.  HEENT:  Normal exam.  SKIN:  Normal exam.  MUSCULOSKELETAL:  Normal exam.   ASSESSMENT AND PLAN:  This is a 55 year old who presents with a history  of exertional chest pain.  This chest pain comes on with moderate  exertion and he did have an episode of prolonged chest pain about 2  weeks ago with EKG changes.  1. Coronary artery disease.  The patient does have symptoms that are      quite consistent with angina pectoris.  He gets chest pain with      moderate exertion such as walking up a hill, walking a quarter mile      on flat ground, or riding a bicycle.  It seems to have been fairly      reproducible and resolves with rest.  He did have 1 episode of      prolonged chest pain about 2 weeks ago that based on his EKG may      have been consistent with myocardial infarction.  Since 2 weeks      ago, he has not been exerting himself much and he has had no      further chest pain.  We will plan on going ahead and taking him for      a left heart catheterization given his high risk.  We will plan on      doing that next Wednesday.  If he has any pain between now and  then, he will let us know, we will bring him in earlier.  We will      start him on aspirin 81 mg a day as well as Imdur 30 mg a day and      metoprolol 25 mg twice a day.  2. Hyperlipidemia.  The patient was started on Zocor 20 mg  daily at      [facility].  His LDL was 130, I am going to go ahead and increase      that to 40 mg daily.  3. Tobacco abuse.  I did strongly encourage the patient to stop      smoking.  He says he thinks he can do that on his own.  He does not      want any medications to help with this.  4. Cocaine abuse.  This needs to be stopped as well.  I think that if      the patient does need a stent, we probably would be best off      placing a bare-metal stent.     Marca Ancona, MD  Electronically Signed    DM/MedQ  DD: 04/21/2008  DT: 04/22/2008  Job #: [id number]   cc:   Dineen Kid. Reche Dixon, M.D.

## 2010-08-09 NOTE — Discharge Summary (Signed)
NAMEMERVIN, RAMIRES               ACCOUNT NO.:  [id number]   MEDICAL RECORD NO.:  [id number]          PATIENT TYPE:  INP   LOCATION:  2019                         FACILITY:  [facility]   PHYSICIAN:  Salvatore Decent. Dorris Fetch, M.D.DATE OF BIRTH:  [birth year]   DATE OF ADMISSION:  04/29/2008  DATE OF DISCHARGE:  05/07/2008                               DISCHARGE SUMMARY   ADDENDUM   Mr. Garner was tentatively scheduled for discharge on or about May 04, 2008.  He had original plans to stay with his nephew.  These plans  were unable to be kept and it was ultimately determined with the  assistance of the care management and social work teams that he would  require a short-term stay in a nursing facility to continue his  convalescence.  He has remained completely stable in the time interval.   His medications have been adjusted a little bit and discharge  medications at this time are as follows:  1. Aspirin 81 mg daily.  2. Simvastatin 40 mg daily.  3. Combivent inhaler 2 puffs 4 times daily.  4. Lasix 40 mg daily for 3 days.  5. Potassium chloride 20 mEq daily for 3 days.  6. Multivitamin 1 daily.  7. Nicotine patch 21 mg per 24 hours.  8. Oxycodone 5 mg 1-2 every 4-6 hours as needed.  9. Coreg 6.25 mg twice daily.  10.Lisinopril 5 mg daily.   The remainder of his discharge summary is uncharged.  Please see the  previously dictated summary for further details of his hospitalization.      Rowe Clack, P.A.-C.      Salvatore Decent Dorris Fetch, M.D.  Electronically Signed    WEG/MEDQ  D:  05/07/2008  T:  05/08/2008  Job:  [id number]   cc:   Salvatore Decent. Dorris Fetch, M.D.  Marca Ancona, MD

## 2010-08-09 NOTE — Discharge Summary (Signed)
[name], [name]               ACCOUNT NO.:  [id number]   MEDICAL RECORD NO.:  [id number]          PATIENT TYPE:  INP   LOCATION:  2019                         FACILITY:  [facility]   PHYSICIAN:  Salvatore Decent. Dorris Fetch, M.D.DATE OF BIRTH:  [birth year]   DATE OF ADMISSION:  04/29/2008  DATE OF DISCHARGE:                               DISCHARGE SUMMARY   FINAL DIAGNOSIS:  Severe 2-vessel coronary artery disease status post  recent myocardial infarction.   IN-HOSPITAL DIAGNOSES:  1. Thrombocytopenia postoperatively.  2. Acute blood loss anemia postoperatively.   SECONDARY DIAGNOSES:  1. Hyperlipidemia.  2. Gastroesophageal reflux disease.  3. Cocaine abuse.  4. Marijuana abuse.  5. Tobacco abuse.  6. Questionable mini-stroke manifested by right upper extremity      numbness.   IN-HOSPITAL OPERATIONS AND PROCEDURES:  1. Cardiac catheterization.  2. Coronary artery bypass grafting x4 using a left internal mammary      artery to left anterior descending, saphenous vein graft to second      diagonal, sequential saphenous vein graft to posterior descending,      and posterolateral.  Endoscopic vein harvest from left leg was      done.   HISTORY AND PHYSICAL AND HOSPITAL COURSE:  [name] is a 55 year old  gentleman with multisubstance abuse who has a longstanding history of  exertional chest pain.  Approximately 2 weeks prior to admission, he had  severe prolonged episode and continuing angina following that.  It is  felt that he had a myocardial infarction.  Cardiac catheterization done,  which showed LAD was totally occluded, but did feel that he has some  collaterals.  There was also diffuse and heavy disease in the right  coronary system.  The circumflex was relatively uninfected.  The patient  had a significant left ventricular dysfunction with ejection fraction  approximately 30% and anterior and apical akinesis.  The patient was  seen and evaluated by Dr. Dorris Fetch.  Dr.  Dorris Fetch discussed with  the patient of undergoing coronary artery bypass grafting.  He discussed  risks and benefits with the patient.  The patient also understands and  agreed to proceed.  Surgery was scheduled for April 30, 2008.  For  details of the patient's medical history and physical exam, please see  dictated H&P.   The patient was taken to the operating room on April 30, 2008, where  he underwent coronary artery bypass grafting x4 using a left internal  mammary artery to left anterior descending, saphenous vein graft to  second diagonal, sequential saphenous vein graft to posterior  descending, and posterolateral branches.  The patient had endoscopic  vein harvest from left leg done.  The patient tolerated this procedure  and was transferred to the intensive care unit in stable condition.  Postoperatively, the patient was noted to be hemodynamically stable.  He  was extubated in the evening of surgery.  Postextubation, the patient  noted to be alert and oriented x4, neuro intact.  The patient's  postoperative course was pretty much unremarkable.  While in the  intensive care unit, followup chest x-ray obtained which was  stable.  Chest tubes has had minimum drainage and were discontinued in normal  fashion.  All drips were able to be weaned and discontinued.  The  patient was noted to be in normal sinus rhythm.  Blood pressure is  stable.  The patient was up ambulating well with cardiac rehabilitation.  He did have some mild acute blood loss anemia, but was stable.  He did  not require any blood transfusions.  The patient was felt to be stable  and ready for transfer out to 2000 postop day #2.  The patient continued  to progress well in the floor.  He remained in normal sinus rhythm.  Blood pressure remained stable.  Currently, oxygen has been weaned off.  The patient was encouraged to use his incentive spirometer.  Repeat  chest x-ray shows bilateral pleural effusions.   The patient remained  afebrile.  He continued to ambulate well with cardiac rehabilitation.  He continued to tolerate diet well.  No nausea or vomiting noted.  All  incisions noted to be clean, dry, and intact and healing well.   On postop day #3 on May 03, 2008, the patient's vitals were stable.  Most recent lab work shows white blood count of 7.7, hemoglobin of 12.1,  hematocrit 35.5, platelet count 104.  Sodium of 138, potassium 3.6,  chloride of 101, bicarbonate 28, BUN of 12, creatinine 0.84, glucose of  102.  The patient is tentatively ready for discharge home within the  next 24-48 hours depending he remains stable.   FOLLOWUP APPOINTMENTS:  Followup appointment will be arranged with Dr.  Dorris Fetch in 3 weeks.  Our office will contact the patient at this  information.  The patient will need to obtain PA and lateral chest x-ray  30 minutes prior to this appointment.  The patient will need to follow  up with Dr. Shirlee Latch in 2 weeks.  He need to contact Dr. Alford Highland office  to make these arrangements.   ACTIVITY:  The patient was instructed no driving until released to do  so, no lifting over 10 pounds.  He is told to ambulate 3-4 times per  day.   PROGRESS:  As tolerated and continue his breathing exercises.   INCISIONAL CARE:  The patient is told to shower and washing his  incisions using soap and water.  He is to contact the office if he  develops any drainage or opening from any of his incision sites.   DIET:  The patient is educated on diet to be low fat and low salt.   DISCHARGE MEDICATIONS:  1. Aspirin 81 mg daily.  2. Simvastatin 40 mg at night.  3. Combivent inhaler 2 puffs q.i.d.  4. Toprol-XL 25 mg daily.  5. Lasix 40 mg daily x3 days.  6. Potassium chloride 20 mEq daily x3 days.  7. Multivitamin daily.  8. Nicotine patch over the counter 21 mg per 24 hours transdermally      change daily.  9. Oxycodone 5 mg 1-2 tabs q. 4-6h. p.r.n.      Theda Belfast, PA      Salvatore Decent. Dorris Fetch, M.D.  Electronically Signed    KMD/MEDQ  D:  05/03/2008  T:  05/03/2008  Job:  [id number]   cc:   Salvatore Decent. Dorris Fetch, M.D.  Marca Ancona, MD

## 2012-12-29 ENCOUNTER — Encounter (HOSPITAL_COMMUNITY): Payer: Self-pay | Admitting: Emergency Medicine

## 2012-12-29 ENCOUNTER — Other Ambulatory Visit: Payer: Self-pay

## 2012-12-29 ENCOUNTER — Emergency Department (HOSPITAL_COMMUNITY): Payer: Medicaid Other

## 2012-12-29 ENCOUNTER — Emergency Department (HOSPITAL_COMMUNITY)
Admission: EM | Admit: 2012-12-29 | Discharge: 2012-12-29 | Disposition: A | Discharge status: Home / Self Care | Payer: Medicaid Other | Attending: Emergency Medicine | Admitting: Emergency Medicine

## 2012-12-29 DIAGNOSIS — S060X9A Concussion with loss of consciousness of unspecified duration, initial encounter: Secondary | ICD-10-CM | POA: Insufficient documentation

## 2012-12-29 DIAGNOSIS — S20219A Contusion of unspecified front wall of thorax, initial encounter: Secondary | ICD-10-CM | POA: Insufficient documentation

## 2012-12-29 DIAGNOSIS — Z87891 Personal history of nicotine dependence: Secondary | ICD-10-CM | POA: Insufficient documentation

## 2012-12-29 DIAGNOSIS — S0081XA Abrasion of other part of head, initial encounter: Secondary | ICD-10-CM

## 2012-12-29 DIAGNOSIS — R112 Nausea with vomiting, unspecified: Secondary | ICD-10-CM | POA: Insufficient documentation

## 2012-12-29 DIAGNOSIS — H538 Other visual disturbances: Secondary | ICD-10-CM | POA: Insufficient documentation

## 2012-12-29 DIAGNOSIS — Z951 Presence of aortocoronary bypass graft: Secondary | ICD-10-CM | POA: Insufficient documentation

## 2012-12-29 DIAGNOSIS — S20211A Contusion of right front wall of thorax, initial encounter: Secondary | ICD-10-CM

## 2012-12-29 DIAGNOSIS — IMO0002 Reserved for concepts with insufficient information to code with codable children: Secondary | ICD-10-CM | POA: Insufficient documentation

## 2012-12-29 MED ORDER — IBUPROFEN 400 MG PO TABS: 400.0000 mg | ORAL_TABLET | Freq: Four times a day (QID) | ORAL | Status: DC | PRN

## 2012-12-29 MED ORDER — OXYCODONE-ACETAMINOPHEN 5-325 MG PO TABS
1.0000 | ORAL_TABLET | Freq: Once | ORAL | Status: AC
  Administered 2012-12-29: 1 via ORAL
  Filled 2012-12-29: qty 1

## 2012-12-29 MED ORDER — HYDROCODONE-ACETAMINOPHEN 5-325 MG PO TABS: ORAL_TABLET | ORAL | Status: DC
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
## 2012-12-29 NOTE — ED Notes (Signed)
Pt left all his possessions.

## 2012-12-29 NOTE — ED Provider Notes (Signed)
Medical screening examination/treatment/procedure(s) were performed by non-physician practitioner and as supervising physician I was immediately available for consultation/collaboration.  Makaio Mach L Kaedin Hicklin, MD 12/29/12 1715 

## 2015-03-31 ENCOUNTER — Encounter (HOSPITAL_COMMUNITY): Payer: Self-pay | Admitting: Emergency Medicine

## 2015-03-31 ENCOUNTER — Emergency Department (HOSPITAL_COMMUNITY)
Admission: EM | Admit: 2015-03-31 | Discharge: 2015-03-31 | Disposition: A | Discharge status: Home / Self Care | Payer: Medicaid Other | Attending: Emergency Medicine | Admitting: Emergency Medicine

## 2015-03-31 DIAGNOSIS — Y998 Other external cause status: Secondary | ICD-10-CM | POA: Diagnosis not present

## 2015-03-31 DIAGNOSIS — Y92410 Unspecified street and highway as the place of occurrence of the external cause: Secondary | ICD-10-CM | POA: Insufficient documentation

## 2015-03-31 DIAGNOSIS — M542 Cervicalgia: Secondary | ICD-10-CM

## 2015-03-31 DIAGNOSIS — F17210 Nicotine dependence, cigarettes, uncomplicated: Secondary | ICD-10-CM | POA: Insufficient documentation

## 2015-03-31 DIAGNOSIS — Y9389 Activity, other specified: Secondary | ICD-10-CM | POA: Diagnosis not present

## 2015-03-31 DIAGNOSIS — S199XXA Unspecified injury of neck, initial encounter: Secondary | ICD-10-CM | POA: Insufficient documentation

## 2015-03-31 MED ORDER — ACETAMINOPHEN 500 MG PO TABS: 500.0000 mg | ORAL_TABLET | Freq: Four times a day (QID) | ORAL | Status: DC | PRN

## 2015-03-31 MED ORDER — ACETAMINOPHEN 325 MG PO TABS
650.0000 mg | ORAL_TABLET | Freq: Once | ORAL | Status: AC
  Administered 2015-03-31: 650 mg via ORAL
  Filled 2015-03-31: qty 2

## 2015-03-31 MED ORDER — METHOCARBAMOL 500 MG PO TABS: 500.0000 mg | ORAL_TABLET | Freq: Two times a day (BID) | ORAL | Status: DC | PRN

## 2015-03-31 NOTE — Discharge Instructions (Signed)
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
## 2015-06-18 ENCOUNTER — Other Ambulatory Visit: Payer: Self-pay

## 2015-06-18 MED ORDER — HYDROCODONE-ACETAMINOPHEN 5-325 MG PO TABS: ORAL_TABLET | ORAL | Status: DC

## 2015-06-18 MED ORDER — OXYCODONE-ACETAMINOPHEN 10-325 MG PO TABS: 1.0000 | ORAL_TABLET | Freq: Four times a day (QID) | ORAL | Status: DC | PRN

## 2015-07-20 ENCOUNTER — Other Ambulatory Visit: Payer: Self-pay

## 2015-07-20 MED ORDER — OXYCODONE-ACETAMINOPHEN 10-325 MG PO TABS: 1.0000 | ORAL_TABLET | Freq: Four times a day (QID) | ORAL | Status: DC | PRN

## 2017-10-16 ENCOUNTER — Observation Stay (HOSPITAL_COMMUNITY)
Admission: EM | Admit: 2017-10-16 | Discharge: 2017-10-17 | Disposition: A | Discharge status: Home / Self Care | Payer: Medicaid Other | Attending: Internal Medicine | Admitting: Internal Medicine

## 2017-10-16 ENCOUNTER — Other Ambulatory Visit: Payer: Self-pay

## 2017-10-16 ENCOUNTER — Encounter (HOSPITAL_COMMUNITY): Payer: Self-pay | Admitting: Emergency Medicine

## 2017-10-16 ENCOUNTER — Emergency Department (HOSPITAL_COMMUNITY): Payer: Medicaid Other

## 2017-10-16 DIAGNOSIS — I2510 Atherosclerotic heart disease of native coronary artery without angina pectoris: Secondary | ICD-10-CM | POA: Diagnosis not present

## 2017-10-16 DIAGNOSIS — F17210 Nicotine dependence, cigarettes, uncomplicated: Secondary | ICD-10-CM | POA: Diagnosis not present

## 2017-10-16 DIAGNOSIS — Z951 Presence of aortocoronary bypass graft: Secondary | ICD-10-CM | POA: Insufficient documentation

## 2017-10-16 DIAGNOSIS — R079 Chest pain, unspecified: Secondary | ICD-10-CM | POA: Diagnosis present

## 2017-10-16 DIAGNOSIS — R0789 Other chest pain: Secondary | ICD-10-CM | POA: Diagnosis not present

## 2017-10-16 HISTORY — DX: Tobacco use: Z72.0

## 2017-10-16 HISTORY — DX: Ischemic cardiomyopathy: I25.5

## 2017-10-16 HISTORY — DX: Cocaine abuse, uncomplicated: F14.10

## 2017-10-16 HISTORY — DX: Atherosclerotic heart disease of native coronary artery without angina pectoris: I25.10

## 2017-10-16 LAB — BASIC METABOLIC PANEL
ANION GAP: 7 (ref 5–15)
BUN: 11 mg/dL (ref 8–23)
CHLORIDE: 108 mmol/L (ref 98–111)
CO2: 25 mmol/L (ref 22–32)
CREATININE: 0.9 mg/dL (ref 0.61–1.24)
Calcium: 9.4 mg/dL (ref 8.9–10.3)
GFR calc non Af Amer: >60 mL/min (ref >60)
Glucose, Bld: 100 mg/dL — ABNORMAL HIGH (ref 70–99)
POTASSIUM: 3.8 mmol/L (ref 3.5–5.1)
SODIUM: 140 mmol/L (ref 135–145)

## 2017-10-16 LAB — LIPID PANEL
CHOL/HDL RATIO: 3.8 ratio
Cholesterol: 272 mg/dL — ABNORMAL HIGH (ref 0–200)
HDL: 71 mg/dL (ref >40)
LDL Cholesterol: 166 mg/dL — ABNORMAL HIGH (ref 0–99)
Triglycerides: 174 mg/dL — ABNORMAL HIGH (ref <150)
VLDL: 35 mg/dL (ref 0–40)

## 2017-10-16 LAB — CBC
HCT: 44.4 % (ref 39.0–52.0)
HEMATOCRIT: 45.5 % (ref 39.0–52.0)
HEMOGLOBIN: 14.8 g/dL (ref 13.0–17.0)
Hemoglobin: 14.7 g/dL (ref 13.0–17.0)
MCH: 31.8 pg (ref 26.0–34.0)
MCH: 31.9 pg (ref 26.0–34.0)
MCHC: 32.5 g/dL (ref 30.0–36.0)
MCHC: 33.1 g/dL (ref 30.0–36.0)
MCV: 97.6 fL (ref 78.0–100.0)
MCV: 96.3 fL (ref 78.0–100.0)
PLATELETS: 216 10*3/uL (ref 150–400)
Platelets: 239 10*3/uL (ref 150–400)
RBC: 4.66 MIL/uL (ref 4.22–5.81)
RBC: 4.61 MIL/uL (ref 4.22–5.81)
RDW: 13.2 % (ref 11.5–15.5)
RDW: 13.2 % (ref 11.5–15.5)
WBC: 3.2 10*3/uL — AB (ref 4.0–10.5)
WBC: 3.3 10*3/uL — ABNORMAL LOW (ref 4.0–10.5)

## 2017-10-16 LAB — I-STAT TROPONIN, ED
TROPONIN I, POC: 0 ng/mL (ref 0.00–0.08)
Troponin i, poc: 0 ng/mL (ref 0.00–0.08)

## 2017-10-16 LAB — CREATININE, SERUM
Creatinine, Ser: 0.99 mg/dL (ref 0.61–1.24)
GFR calc Af Amer: >60 mL/min (ref >60)
GFR calc non Af Amer: >60 mL/min (ref >60)

## 2017-10-16 LAB — TROPONIN I

## 2017-10-16 MED ORDER — ACETAMINOPHEN 650 MG RE SUPP: 650.0000 mg | Freq: Four times a day (QID) | RECTAL | Status: DC | PRN

## 2017-10-16 MED ORDER — ONDANSETRON HCL 4 MG/2ML IJ SOLN: 4.0000 mg | Freq: Four times a day (QID) | INTRAMUSCULAR | Status: DC | PRN

## 2017-10-16 MED ORDER — ATORVASTATIN CALCIUM 20 MG PO TABS
40.0000 mg | ORAL_TABLET | Freq: Every day | ORAL | Status: DC
  Administered 2017-10-16 – 2017-10-17 (×2): 40 mg via ORAL
  Filled 2017-10-16 (×2): qty 2

## 2017-10-16 MED ORDER — ONDANSETRON HCL 4 MG PO TABS: 4.0000 mg | ORAL_TABLET | Freq: Four times a day (QID) | ORAL | Status: DC | PRN

## 2017-10-16 MED ORDER — ASPIRIN EC 81 MG PO TBEC
81.0000 mg | DELAYED_RELEASE_TABLET | Freq: Every day | ORAL | Status: DC
  Administered 2017-10-16 – 2017-10-17 (×2): 81 mg via ORAL
  Filled 2017-10-16 (×2): qty 1

## 2017-10-16 MED ORDER — ACETAMINOPHEN 325 MG PO TABS: 650.0000 mg | ORAL_TABLET | Freq: Four times a day (QID) | ORAL | Status: DC | PRN

## 2017-10-16 MED ORDER — SODIUM CHLORIDE 0.9% FLUSH: 3.0000 mL | Freq: Two times a day (BID) | INTRAVENOUS | Status: DC

## 2017-10-16 MED ORDER — POLYETHYLENE GLYCOL 3350 17 G PO PACK: 17.0000 g | PACK | Freq: Every day | ORAL | Status: DC | PRN

## 2017-10-16 MED ORDER — ENOXAPARIN SODIUM 40 MG/0.4ML ~~LOC~~ SOLN: 40.0000 mg | SUBCUTANEOUS | Status: DC

## 2017-10-16 NOTE — H&P (Signed)
Date: 10/16/2017               Patient Name:  [name] MRN: [id number]  DOB: 10-12-1955 Age / Sex: 62 y.o., male   PCP: Patient, No Pcp Per         Medical Service: Internal Medicine Teaching Service         Attending Physician: Dr. Dareen Piano, [place]    First Contact: Dr. Sharon Seller Pager: [phone]  Second Contact: Dr. Reesa Chew Pager: [phone]       After Hours (After 5p/  First Contact Pager: [phone]  weekends / holidays): Second Contact Pager: [phone]   Chief Complaint: Chest pain  History of Present Illness: Mr. Kupper is a 62yo male with hx of HLD, polysubstance abuse (including cocaine), CAD s/p bypass surgery 2012 presenting today with sudden sharp pain directly below the patient's chest that shot up to the lower chest and lasted a few minutes. He had associated sweating and headache but is unsure if he had SOB or nausea. He denies dyspnea, numbness/tingling, arm pain or neck pain at the time that chest pain occurred. He also denies associated dizziness. He has had one more episode since entering the ER but this only lasted a few seconds when he was trying to turn over and had no other associated symptoms. He described it as a shooting pain. He states this has happened a couple time before but not in a very long time although he is unsure how long. He is unsure if this is similar to his previous chest pain when he had an MI.    He is not sure when he last saw a heart doctor. He was supposed to see a primary care physician recently but was unable to make the appointment. He is unsure why he stopped taking his medications after his bypass but said he stopped going to see the cardiologist after his surgery. He only takes motrin during the winter and doesn't take any other medications. He states up until one month ago he did drugs like cocaine and marijuana and anything else that was around. When asked how often he took drugs he stated "enough to know I should stop." He says he is now  in school for drugs and has cleaned up. He does not drink and smokes "three to four cigarettes a day."     Meds:  Current Meds  Medication Sig  . ibuprofen (ADVIL,MOTRIN) 400 MG tablet Take 1 tablet (400 mg total) by mouth every 6 (six) hours as needed for pain.     Allergies: Allergies as of 10/16/2017  . (No Known Allergies)   History reviewed. No pertinent past medical history.  Social History: He smokes 1/2ppd for 40 years, currently smoking 3-4 cigarettes a day, states he last used cocaine & marijuana 1 month ago and no longer uses drugs. Denies alcohol use.   Review of Systems: A complete ROS was negative except as per HPI.   Physical Exam: Blood pressure (!) 126/92, pulse 62, temperature 98.4 F (36.9 C), temperature source Oral, resp. rate 12, height 5\' 7"  (1.702 m), weight 145 lb (65.8 kg), SpO2 99 %.  Physical Exam   Constitution: NAD, lying supine in bed, cooperative, thin HENT: dental caries, atraumatic, normocephalic Eyes: no scleral icterus, EOM intact Cardio: RRR, no murmurs, rubs, gallops Respiratory: RLL crackles, otherwise CTA, non-labored breathing  Abdominal: +bs, NTTP, non-distended Neuro: A&Ox3, aggravated because he is hungry, normal affect Skin: no edema present, clean, dry, intact  EKG: personally reviewed my interpretation is inverted T waves precordial leads. V3, V4 consistent with prior 2014 EKG, lateral lead inversion new from previous. QS complex indicating possible prior anterolateral infarct.  CXR: personally reviewed my interpretation is prior cardiac surgery, otherwise no acute abnormalities.   Assessment & Plan by Problem: Active Problems:   * No active hospital problems. *  62yo male with hx of HLD, polysubstance abuse (including cocaine & marijuana), CAD s/p bypass surgery 2012 presenting today with sharp, shooting upper abdominal/lower chest pain that began around 9am with associated diaphoresis and headache that lasted several  minutes. He had a second episode in ER that lasted less than a minute and was associated with trying to shift to his side. CXR significant for COPD, otherwise WNL. EKG with newer T wave inversion compared to previous in V5-V6, otherwise consistent with previous in 2014.    #Chest Pain: Bypassx4 in 2012, has not seen cardiologist in years or been taking BP or HDL medications since the time of sx. Troponin in ED were negativex2.   - admit to observation - asa 81mg , atorvastatin 40mg  - telemetry - cardiology consulted in ED, appreciate input  - trend troponin q6 x3 - ECHO pending   #HLD: previously on simvastatin 40mg  qd 2012 - lipid panel  - atorvastatin 40mg   #HTN: d/c in 2012 s/p bypass with coreg 6.25 bid, lisinopril 5mg  qd. No HTN on admission. Patient has not taken these medications in years, possibly since surgery.   DVT prophylaxis: LVX  Dispo: Admit patient to Observation with expected length of stay less than 2 midnights.  SignedMarty Heck, DO 10/16/2017, 2:38 PM  Pager: [phone]

## 2017-10-16 NOTE — Consult Note (Addendum)
Cardiology Consult    Patient ID: CODEN FRANCHI MRN: [id number], DOB/AGE: March 06, 1956   Admit date: 10/16/2017 Date of Consult: 10/16/2017  Primary Physician: Patient, No Pcp Per Primary Cardiologist: Durward Parcel seen by Einar Crow, MD in 2010 Requesting Provider: Orie Fisherman, MD  Patient Profile    [name] is a 62 y.o. male with a history of CAD, ICM, tob and cocaine abuse, who is being seen today for the evaluation of chest pain at the request of Dr. Dareen Piano.  Past Medical History   Past Medical History:  Diagnosis Date  . CAD (coronary artery disease)    a. 04/2008 Cath: LM nl, LAD 50p, 27m, LCX min irregs, RCA 50p/m, 40d, RPDA 60-70ost; b. 04/2008 CABG x 4: LIMA->LAD, VG->D2, VG->RPDA->RPL.  . Cocaine abuse ([place])    a. Quit early 2019.  . Ischemic cardiomyopathy    a. LV gram: EF 30-35%, apical/periapical AK.  . Tobacco abuse     Past Surgical History:  Procedure Laterality Date  . BYPASS GRAFT    . CARDIAC SURGERY       Allergies  No Known Allergies  History of Present Illness    62 year old male with a history of CAD status post CABG x4 in 2010, ischemic cardiomyopathy with EF 30 to 35%, tobacco abuse, and cocaine abuse.  He has not been seen by cardiology in several years and takes no medicines at home currently.  He continues to smoke about a pack of cigarettes per week and previously used cocaine but quit about 3 to 4 months ago.  He says he is currently staying at a home in [place] to help individuals with drug addiction obtain employment and independent living.  He says he is relatively active, walking frequently and also sometimes doing some calisthenics.  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  He says that since his bypass, he has done quite well and does not routinely experience chest pain or dyspnea.    He was in his usual state of health  until this morning, when he went to a local restauran and had an episode of sharp left-sided chest pain that lasted approximately 1 to 2 minutes and then was followed by a dull aching sensation, which persisted an additional 1-2 mins and then resolved.  Staff called EMS and pt was pain free upon their arrival.  He was taken to the ED for further eval.  Here, trop was nl.  ECG showed sinus rhythm with prior anterolateral infarct and inflat TWI.  While in the ED, he had another brief episode of sharp chest pain, lasting just a few seconds.  VS have been stable.  We have been asked to eval.  Inpatient Medications    . aspirin EC  81 mg Oral Daily  . atorvastatin  40 mg Oral q1800  . enoxaparin (LOVENOX) injection  40 mg Subcutaneous Q24H  . sodium chloride flush  3 mL Intravenous Q12H    Family History    indicated that his mother is deceased. He indicated that his father is deceased.  Social History    Social History   Socioeconomic History  . Marital status: Single    Spouse name: Not on file  . Number of children: Not on file  . Years of education: Not on file  . Highest education level: Not on file  Occupational History  .  Not on file  Social Needs  . Financial resource strain: Not on file  . Food insecurity:    Worry: Not on file    Inability: Not on file  . Transportation needs:    Medical: Not on file    Non-medical: Not on file  Tobacco Use  . Smoking status: Current Every Day Smoker    Packs/day: 0.30    Types: Cigarettes  . Smokeless tobacco: Current User  . Tobacco comment: has smoked for 40+ yrs - at most 1ppd, currently 1ppwk.  Substance and Sexual Activity  . Alcohol use: No    Comment: quite > 30 yrs ago.  . Drug use: No    Comment: prev used cocaine - quit early 2019.  Marland Kitchen Sexual activity: Not on file  Lifestyle  . Physical activity:    Days per week: Not on file    Minutes per session: Not on file  . Stress: Not on file  Relationships  . Social  connections:    Talks on phone: Not on file    Gets together: Not on file    Attends religious service: Not on file    Active member of club or organization: Not on file    Attends meetings of clubs or organizations: Not on file    Relationship status: Not on file  . Intimate partner violence:    Fear of current or ex partner: Not on file    Emotionally abused: Not on file    Physically abused: Not on file    Forced sexual activity: Not on file  Other Topics Concern  . Not on file  Social History Narrative   Lives in [place] by himself.  Currently staying in a half-way house designed to help those addicted to drugs get clean, and find housing/employment.  He exercises some - walks/push ups.     Review of Systems    General:  No chills, fever, night sweats or weight changes.  Cardiovascular:  +++ sharp chest pain x 2 today, no dyspnea on exertion, edema, orthopnea, palpitations, paroxysmal nocturnal dyspnea. Dermatological: No rash, lesions/masses Respiratory: No cough, dyspnea Urologic: No hematuria, dysuria Abdominal:   No nausea, vomiting, diarrhea, bright red blood per rectum, melena, or hematemesis Neurologic:  No visual changes, wkns, changes in mental status. All other systems reviewed and are otherwise negative except as noted above.  Physical Exam    Blood pressure 115/73, pulse 67, temperature (!) 97.5 F (36.4 C), temperature source Oral, resp. rate 16, height 5\' 7"  (1.702 m), weight 130 lb 15.3 oz (59.4 kg), SpO2 96 %.  General: Pleasant, NAD Psych: Normal affect. Neuro: Alert and oriented X 3. Moves all extremities spontaneously. HEENT: Normal  Neck: Supple without bruits or JVD. Lungs:  Resp regular and unlabored, CTA. Heart: RRR no s3, s4, or murmurs. Abdomen: Soft, non-tender, non-distended, BS + x 4.  Extremities: No clubbing, cyanosis or edema. DP/PT/Radials 2+ and equal bilaterally.  Labs    Troponin [facility] Test) Recent Labs    10/16/17 1403    TROPIPOC 0.00   Lab Results  Component Value Date   WBC 3.3 (L) 10/16/2017   HGB 14.7 10/16/2017   HCT 44.4 10/16/2017   MCV 96.3 10/16/2017   PLT 239 10/16/2017    Recent Labs  Lab 10/16/17 1013  NA 140  K 3.8  CL 108  CO2 25  BUN 11  CREATININE 0.90  CALCIUM 9.4  GLUCOSE 100*     Radiology Studies  Dg Chest 2 View  Result Date: 10/16/2017 CLINICAL DATA:  Onset of sharp chest pain today with mild shortness of breath. History of previous CABG, current smoker. EXAM: CHEST - 2 VIEW COMPARISON:  Chest x-ray of December 29, 2012 FINDINGS: The lungs are mildly hyperinflated. There is no focal infiltrate. The interstitial markings are mildly prominent though stable. The heart and pulmonary vascularity are normal. The patient has undergone previous CABG. There is calcification in the wall of the aortic arch. There is no pleural effusion or pneumothorax. There is mild multilevel degenerative disc disease of the thoracic spine. IMPRESSION: COPD-smoking related changes.  Previous CABG.  No pneumonia nor CHF. Thoracic aortic atherosclerosis. Electronically Signed   By: David  Martinique M.D.   On: 10/16/2017 10:42   ECG & Cardiac Imaging    RSR, 69, LAE, prior anterolateral infarct, inflat TWI - more pronounced than on 06-06-12 ECG  Assessment & Plan    1.  Atypical chest pain/CAD:  Pt with a h/o CABG x4 in June 06, 2008 with LV dysfunction noted at that time.  He has not followed up since then.  He says he has been doing quite well and has not been taking any medications at home.  He had 2 episodes of sharp and fleeting chest pain, one occurring this morning and a second episode occurring while in the emergency department.  ECG with prior anterolateral infarct and slightly more pronounced inferolateral T wave inversion.  Troponins have been normal thus far.  He is currently chest pain-free.  Agree with observation and initiation of aspirin and statin therapy.  Blood pressure is stable and at this time will  hold off on beta-blocker.  Agree with checking echocardiogram given prior history of LV dysfunction.  Would hold off on any ischemic evaluation unless he has recurrent or more typical symptoms.  I think most importantly, we need to get him routed back into regular outpt cardiology follow-up.  2. Ischemic cardiomyopathy: EF 30 to 35% by left ventriculography in February 2010.  He has not had follow-up echo since then has not been seen in clinic either.  He is on no medications at home and is euvolemic on exam..  Follow-up echocardiogram and if he has persistent LV dysfunction, would plan to add low-dose but has done exceptionally well beta-blocker and ARB.  He may not have enough blood pressure to tolerate both.  3.  Tobacco abuse: Currently smoking about a pack of cigarettes a week.  Cessation advised.  4.  History of cocaine abuse: Says he quit about 3 to 4 months ago.  Encouraged him to remain off of cocaine.  5.  Leukopenia: This appears to date back to October 2010.  Stable with WBC of 3.3.  Signed, Murray Hodgkins, NP 10/16/2017, 7:16 PM  For questions or updates, please contact   Please consult [email or web address] for contact info under Cardiology/STEMI.   Personally seen and examined. Agree with above.  62 year old with known CAD status post CABG with atypical sharp fleeting chest discomfort twice.  Previously had an ischemic cardiomyopathy with EF 35%.  This is not been checked in several years.  He is not currently on any medications.  Lives in a halfway house.  Denies any illicit drug use, quit cocaine in the past.  Currently chest pain-free, no shortness of breath, no fevers chills nausea vomiting syncope.  GEN: Thin, in no acute distress  HEENT: normal  Neck: no JVD, carotid bruits, or masses Cardiac: RRR; no murmurs, rubs, or gallops,no edema CABG scar  Respiratory:  clear to auscultation bilaterally, normal work of breathing GI: soft, nontender, nondistended, + BS MS: no  deformity or atrophy  Skin: warm and dry, no rash Neuro:  Alert and Oriented x 3, Strength and sensation are intact Psych: euthymic mood, full affect  Labs: Negative troponin  ECG: T wave inversions noted on ECG precordial leads, lateral are accentuated when compared to prior.  LVH noted.  Assessment and plan:  Atypical chest pain - Troponins are negative at this point.  ECG does show T wave inversions however many were present on prior ECG, only accentuated currently.  I would like to obtain an echocardiogram to evaluate structure and function.  It is possible that he does have left ventricular hypertrophy and perhaps continued cardiomyopathy.  If so, we will try to place him back on beta-blocker, ACE inhibitor for instance.  For now, blood pressure is fairly low and I do not want to start any new medications at this time. -His chest pain being sharp is quite atypical.  No evidence of ACS at this time.  Likely musculoskeletal.  Ischemic cardiomyopathy - Prior CAD/CABG/EF 35% back in 2010.  Checking echo.  Tobacco use -Encourage cessation.  Prior history of cocaine use - U tox was positive back in 2009.  He says he quit cocaine 3 to 4 months ago.  One could consider another urinary tox screen.  Encouraged continued cessation.  Candee Furbish, MD

## 2017-10-16 NOTE — ED Notes (Addendum)
Called service response to check on lunch tray requested almost 2 hours ago, verified ticket was made. States no one is answering and she will print an early dinner ticket for pt.  Pt very unhappy

## 2017-10-16 NOTE — ED Triage Notes (Signed)
EMS stated, pt. Stated, I was sitting in chair and I felt a pain on the left side of chest that lasted about 2 min.

## 2017-10-16 NOTE — ED Notes (Signed)
Ordered pt lunch tray

## 2017-10-16 NOTE — ED Provider Notes (Signed)
[facility] EMERGENCY DEPARTMENT Provider Note   CSN: [id number] Arrival date & time: 10/16/17  6629     History   Chief Complaint Chief Complaint  Patient presents with  . Chest Pain    HPI [name] is a 62 y.o. male with history of HLD, polysubstance abuse (cocaine, tobacco, marijuana), CAD with bypass graft x4 who presents with chest pain.  Patient reports that today while sitting in class (3 hours prior to presentation) he had an abrupt onset of chest pain. He describes the pain as left-sided stabbing substernal pain that radiated upwards. He states that the chest pain lasted 2-3 minutes. He endorses associated diaphoresis, SOB, and nausea. He denies any current chest pain, SOB, and nausea. He denies vomiting, cough, congestion, and fever.   Patient reports that he has not seen a cardiologist since his bypass graft surgery in 2012. He reports last using cocaine and marijuana 1 month ago. He denies any other drug use. He continues to smoke tobacco (1/2 pack per day for 40 years).   Patient reports  History reviewed. No pertinent past medical history.  Patient Active Problem List   Diagnosis Date Noted  . HYPERLIPIDEMIA-MIXED 06/16/2008  . TOBACCO ABUSE 06/16/2008  . MARIJUANA ABUSE 06/16/2008  . COCAINE ABUSE 06/16/2008  . CAD, ARTERY BYPASS GRAFT 04/30/2008    Past Surgical History:  Procedure Laterality Date  . BYPASS GRAFT    . CARDIAC SURGERY          Home Medications    Prior to Admission medications   Medication Sig Start Date End Date Taking? Authorizing Provider  acetaminophen (TYLENOL) 500 MG tablet Take 1 tablet (500 mg total) by mouth every 6 (six) hours as needed. 03/31/15   [name], PA-C  ibuprofen (ADVIL,MOTRIN) 400 MG tablet Take 1 tablet (400 mg total) by mouth every 6 (six) hours as needed for pain. 12/29/12   Carlisle Cater, PA-C  methocarbamol (ROBAXIN) 500 MG tablet Take 1 tablet (500 mg total) by mouth 2 (two)  times daily as needed for muscle spasms. 03/31/15   [name], PA-C  oxyCODONE-acetaminophen (PERCOCET) 10-325 MG tablet Take 1 tablet by mouth every 6 (six) hours as needed for pain. 07/20/15   Ashok Norris, MD    Family History No family history on file.  Social History Social History   Tobacco Use  . Smoking status: Current Every Day Smoker    Packs/day: 0.30    Types: Cigarettes  . Smokeless tobacco: Current User  Substance Use Topics  . Alcohol use: No  . Drug use: No     Allergies   Patient has no known allergies.   Review of Systems Review of Systems  Constitutional: Positive for diaphoresis. Negative for activity change, appetite change, fatigue and fever.  HENT: Negative for congestion and sore throat.   Eyes: Negative for visual disturbance.  Respiratory: Positive for shortness of breath. Negative for cough and chest tightness.   Cardiovascular: Positive for chest pain. Negative for palpitations and leg swelling.  Gastrointestinal: Negative for abdominal pain, constipation, diarrhea, nausea and vomiting.  Genitourinary: Negative for dysuria, frequency and hematuria.  Skin: Negative for rash.  Neurological: Positive for light-headedness. Negative for dizziness, syncope and headaches.  All other systems reviewed and are negative.    Physical Exam Updated Vital Signs BP 114/84 (BP Location: Right Arm)   Pulse 63   Temp 98.4 F (36.9 C) (Oral)   Resp 16   Ht 5\' 7"  (1.702 m)  Wt 65.8 kg (145 lb)   SpO2 99%   BMI 22.71 kg/m   Physical Exam  Constitutional: He appears well-developed and well-nourished.  HENT:  Head: Normocephalic and atraumatic.  Eyes: EOM are normal.  Cardiovascular: Normal rate, regular rhythm, intact distal pulses and normal pulses.  Pulmonary/Chest: Effort normal and breath sounds normal.  Abdominal: Soft. Bowel sounds are normal.  Musculoskeletal:       Right lower leg: Normal.       Left lower leg: Normal.    Neurological: He is alert.  Skin: Skin is warm and dry.  Psychiatric: He has a normal mood and affect. His behavior is normal.  Nursing note and vitals reviewed.    ED Treatments / Results  Labs (all labs ordered are listed, but only abnormal results are displayed) Labs Reviewed  BASIC METABOLIC PANEL - Abnormal; Notable for the following components:      Result Value   Glucose, Bld 100 (*)    All other components within normal limits  CBC - Abnormal; Notable for the following components:   WBC 3.2 (*)    All other components within normal limits  I-STAT TROPONIN, ED    EKG EKG Interpretation  Date/Time:  Tuesday October 16 2017 10:08:10 EDT Ventricular Rate:  69 PR Interval:  148 QRS Duration: 92 QT Interval:  410 QTC Calculation: 439 R Axis:   81 Text Interpretation:  Normal sinus rhythm Possible Left atrial enlargement Anterolateral infarct , age undetermined ST & T wave abnormality, consider inferior ischemia Abnormal ECG diffuse flipped t waves seen on prior Otherwise no significant change Confirmed by Deno Etienne [phone]) on 10/16/2017 12:06:32 PM   Radiology Dg Chest 2 View  Result Date: 10/16/2017 CLINICAL DATA:  Onset of sharp chest pain today with mild shortness of breath. History of previous CABG, current smoker. EXAM: CHEST - 2 VIEW COMPARISON:  Chest x-ray of December 29, 2012 FINDINGS: The lungs are mildly hyperinflated. There is no focal infiltrate. The interstitial markings are mildly prominent though stable. The heart and pulmonary vascularity are normal. The patient has undergone previous CABG. There is calcification in the wall of the aortic arch. There is no pleural effusion or pneumothorax. There is mild multilevel degenerative disc disease of the thoracic spine. IMPRESSION: COPD-smoking related changes.  Previous CABG.  No pneumonia nor CHF. Thoracic aortic atherosclerosis. Electronically Signed   By: David  Martinique M.D.   On: 10/16/2017 10:42     Procedures Procedures (including critical care time)  Medications Ordered in ED Medications - No data to display   Initial Impression / Assessment and Plan / ED Course  I have reviewed the triage vital signs and the nursing notes.  Pertinent labs & imaging results that were available during my care of the patient were reviewed by me and considered in my medical decision making (see chart for details).  62 y.o. male with history of HLD, polysubstance abuse (cocaine, tobacco, marijuana), CAD with bypass graft x4 who presents with atypical chest pain. Patient's symptoms and previous history of CVD is concerning for ACS. Patient's initial troponin x2 was negative. EKG showed inverted T-waves in the inferior leads (V4-V6) but these changes were seen in an EKG in 2014. BMP and CBC unremarkable. CXR showed hyperinflated lungs consistent with COPD and thoracic aortic atherosclerosis. Heart Score is 5 (12-16.6% risk of adverse cardiac event). Due to patient's age, HLD, tobacco use, and CAD, patient is at increased risk of major cardiac event and will be admitted for further  cardiac work-up.  Final Clinical Impressions(s) / ED Diagnoses   Final diagnoses:  Chest pain, unspecified type    ED Discharge Orders    None       Carroll Sage, MD 10/16/17 Craig, [place], DO 10/16/17 1439

## 2017-10-17 ENCOUNTER — Observation Stay (HOSPITAL_BASED_OUTPATIENT_CLINIC_OR_DEPARTMENT_OTHER): Payer: Medicaid Other

## 2017-10-17 ENCOUNTER — Other Ambulatory Visit: Payer: Self-pay

## 2017-10-17 DIAGNOSIS — Z79899 Other long term (current) drug therapy: Secondary | ICD-10-CM

## 2017-10-17 DIAGNOSIS — Z951 Presence of aortocoronary bypass graft: Secondary | ICD-10-CM

## 2017-10-17 DIAGNOSIS — I110 Hypertensive heart disease with heart failure: Secondary | ICD-10-CM

## 2017-10-17 DIAGNOSIS — R079 Chest pain, unspecified: Secondary | ICD-10-CM | POA: Diagnosis not present

## 2017-10-17 DIAGNOSIS — E785 Hyperlipidemia, unspecified: Secondary | ICD-10-CM

## 2017-10-17 DIAGNOSIS — Z7982 Long term (current) use of aspirin: Secondary | ICD-10-CM

## 2017-10-17 DIAGNOSIS — R0789 Other chest pain: Secondary | ICD-10-CM | POA: Diagnosis not present

## 2017-10-17 DIAGNOSIS — Z9114 Patient's other noncompliance with medication regimen: Secondary | ICD-10-CM

## 2017-10-17 DIAGNOSIS — F1410 Cocaine abuse, uncomplicated: Secondary | ICD-10-CM

## 2017-10-17 DIAGNOSIS — I5020 Unspecified systolic (congestive) heart failure: Secondary | ICD-10-CM | POA: Diagnosis not present

## 2017-10-17 DIAGNOSIS — J449 Chronic obstructive pulmonary disease, unspecified: Secondary | ICD-10-CM

## 2017-10-17 DIAGNOSIS — I361 Nonrheumatic tricuspid (valve) insufficiency: Secondary | ICD-10-CM | POA: Diagnosis not present

## 2017-10-17 DIAGNOSIS — K029 Dental caries, unspecified: Secondary | ICD-10-CM

## 2017-10-17 DIAGNOSIS — I2510 Atherosclerotic heart disease of native coronary artery without angina pectoris: Secondary | ICD-10-CM | POA: Diagnosis not present

## 2017-10-17 LAB — ECHOCARDIOGRAM COMPLETE
Height: 67 in
WEIGHTICAEL: 2022.4 [oz_av]

## 2017-10-17 LAB — HIV ANTIBODY (ROUTINE TESTING W REFLEX): HIV Screen 4th Generation wRfx: NONREACTIVE

## 2017-10-17 LAB — TROPONIN I: Troponin I: <0.03 ng/mL (ref <0.03)

## 2017-10-17 MED ORDER — ASPIRIN 81 MG PO TBEC: 81.0000 mg | DELAYED_RELEASE_TABLET | Freq: Every day | ORAL | 5 refills | Status: DC

## 2017-10-17 MED ORDER — ATORVASTATIN CALCIUM 40 MG PO TABS: 40.0000 mg | ORAL_TABLET | Freq: Every day | ORAL | 5 refills | Status: DC

## 2017-10-17 MED ORDER — LISINOPRIL 5 MG PO TABS: 5.0000 mg | ORAL_TABLET | Freq: Every day | ORAL | 5 refills | Status: DC

## 2017-10-17 NOTE — Discharge Summary (Signed)
Name: [name] MRN: [id number] DOB: Mar 24, 1956 62 y.o. PCP: Patient, No Pcp Per  Date of Admission: 10/16/2017  9:58 AM Date of Discharge:  Attending Physician: No att. providers found  Discharge Diagnosis: 1. Atypical Chest Pain 2. HFrEF  Discharge Medications: Allergies as of 10/17/2017   No Known Allergies     Medication List    STOP taking these medications   acetaminophen 500 MG tablet Commonly known as:  TYLENOL   oxyCODONE-acetaminophen 10-325 MG tablet Commonly known as:  PERCOCET     TAKE these medications   aspirin 81 MG EC tablet Take 1 tablet (81 mg total) by mouth daily. Start taking on:  10/18/2017   atorvastatin 40 MG tablet Commonly known as:  LIPITOR Take 1 tablet (40 mg total) by mouth daily at 6 PM.   ibuprofen 400 MG tablet Commonly known as:  ADVIL,MOTRIN Take 1 tablet (400 mg total) by mouth every 6 (six) hours as needed for pain.   lisinopril 5 MG tablet Commonly known as:  PRINIVIL,ZESTRIL Take 1 tablet (5 mg total) by mouth daily.       Disposition and follow-up:   Mr.[name] was discharged from [facility] in Stable condition.  At the hospital follow up visit please address:  1.  Atypical Chest Pain: Previously not on any medications despite 4xbypass 2012. On this admission he was started on lipitor 40mg  qd, asa 81mg  qd, and lisinopril 5mg  qd. He will follow-up with cardiology outpt for stress test August 15th  2.  Labs / imaging needed at time of follow-up: none  3.  Pending labs/ test needing follow-up: none  Follow-up Appointments:  - Follow-up with PCP  Follow-up Information    Jerline Pain, MD Follow up on 11/08/2017.   Specialty:  Cardiology Why:  at 9:20 AM Contact information: [address] [place] [address] [phone]           - Cardiology  Hospital Course by problem list: [name] is a 62yo male with hx of HLD, polysubstance abuse (including  cocaine & marijuana), CAD s/p bypass surgery 2012 presenting today with sharp, shooting upper abdominal/lower chest pain that began around 9am 10/16/17 with associated diaphoresis and headache that lasted several minutes. He had no symptoms on presentation to the ER. He had a second episode in ER that lasted less than a minute and was associated with trying to shift to his side. His CXR was significant for COPD, otherwise within normal limits. EKG demonstrated newer T wave inversion compared to previous in V5-V6 and old anterolateral infarct, otherwise consistent with previous in 2014. Cardiology was consulted and patient was admitted for chest pain work up. His troponins x3q6h were all normal. He had one recurrence of sharp, shooting chest pain that lasted a few seconds, again with no associated symptoms. An echo was performed 10/17/2017 that showed EF 35-40% with akinesis along the septal and apical walls, similar to Echo in 2010. [name] has a history of non-compliance, but we discussed the importance of continuing medications for his heart and hyperlipidemia and following up with cardiology as an outpatient. He was started on lipitor 40mg  qd, asa 81qd, and lisinopril 5mg  qd. He will follow-up outpatient with Dr. Marlou [facility]arro at [facility].     Discharge Vitals:   BP 98/63 (BP Location: Left Arm)   Pulse 62   Temp 98.2 F (36.8 C) (Oral)   Resp 16   Ht 5\' 7"  (  1.702 m)   Wt 126 lb 6.4 oz (57.3 kg)   SpO2 99%   BMI 19.80 kg/m   Pertinent Labs, Studies, and Procedures:   ECHO 10/17/17 - Left ventricle: Septal and apical akinesis mid and basal inferior   wall hypokinesis. The cavity size was mildly dilated. Wall   thickness was normal. Systolic function was moderately reduced.   The estimated ejection fraction was in the range of 35% to 40%.   Wall motion was normal; there were no regional wall motion   abnormalities. Doppler parameters are consistent with abnormal   left ventricular  relaxation (grade 1 diastolic dysfunction). - Atrial septum: No defect or patent foramen ovale was identified.  Troponin: <.03 x3 q6h   3.8 Total CHOL/HDL Ratio           Cholesterol    272        HDL Cholesterol    71        LDL (calc)    166        Triglycerides    174        VLDL    35    VLDL     CXR 10/17/17 COPD-smoking related changes.  Previous CABG.  No pneumonia nor CHF.  Thoracic aortic atherosclerosis.    By: David  Martinique M.D.   On: 10/16/2017 10:42  Discharge Instructions: Discharge Instructions    Diet - low sodium heart healthy   Complete by:  As directed    Discharge instructions   Complete by:  As directed    FOLLOW-UP INSTRUCTIONS  Jerline Pain, MD Follow up on 11/08/2017.  Specialty:  Cardiology at 9:20 AM Contact information: [address] [place] [address] [phone] For: Stress test and follow-up for chest pain  What to bring: all of your medication bottles    You were hospitalized for atypical chest pain. Thank you for allowing [place] to be part of your care.   Please note these changes made to your medications:   Atorvastatin (Lipitor) 40mg  qd  Aspirin 81mg  qd Lisinopril 5mg  qd  Please call our clinic if you have any questions or concerns, we may be able to help and keep you from a long and expensive emergency room wait. Our clinic and after hours phone number is [phone], the best time to call is Monday through Friday 9 am to 4 pm but there is always someone available 24/7 if you have an emergency.   Increase activity slowly   Complete by:  As directed       Signed: Marty Heck, DO 10/17/2017, 6:06 PM   Pager: [phone]

## 2017-10-17 NOTE — Plan of Care (Signed)
  Problem: Education: Goal: Knowledge of General Education information will improve Description Including pain rating scale, medication(s)/side effects and non-pharmacologic comfort measures Outcome: Completed/Met   Problem: Health Behavior/Discharge Planning: Goal: Ability to manage health-related needs will improve Outcome: Completed/Met   Problem: Clinical Measurements: Goal: Ability to maintain clinical measurements within normal limits will improve Outcome: Completed/Met Goal: Will remain free from infection Outcome: Completed/Met Goal: Diagnostic test results will improve Outcome: Completed/Met Goal: Respiratory complications will improve Outcome: Completed/Met Goal: Cardiovascular complication will be avoided Outcome: Completed/Met   Problem: Activity: Goal: Risk for activity intolerance will decrease Outcome: Completed/Met   Problem: Nutrition: Goal: Adequate nutrition will be maintained Outcome: Completed/Met   Problem: Pain Managment: Goal: General experience of comfort will improve Outcome: Completed/Met   Problem: Safety: Goal: Ability to remain free from injury will improve Outcome: Completed/Met   Problem: Skin Integrity: Goal: Risk for impaired skin integrity will decrease Outcome: Completed/Met   Problem: Coping: Goal: Level of anxiety will decrease Outcome: Completed/Met   Problem: Elimination: Goal: Will not experience complications related to bowel motility Outcome: Completed/Met Goal: Will not experience complications related to urinary retention Outcome: Completed/Met

## 2017-10-17 NOTE — Progress Notes (Signed)
   Subjective: [name] states he had another episode last night of shooting chest pain that only lasted a couple seconds. At the time he had no other associated symptoms but states he had some sweating earlier this morning. He denies current CP, SOB, and nausea. He was accompanied by his sister at bedside. We discussed that he needs to continue his medications when he is discharged.   Objective:  Vital signs in last 24 hours: Vitals:   10/16/17 1800 10/16/17 1845 10/16/17 2111 10/17/17 0458  BP: 114/75 115/73 104/71 114/69  Pulse: 70 67 66 66  Resp: 16  16 15   Temp:  (!) 97.5 F (36.4 C) 98.1 F (36.7 C) 97.6 F (36.4 C)  TempSrc:  Oral Oral Oral  SpO2:  96% 99%   Weight:  130 lb 15.3 oz (59.4 kg)  126 lb 6.4 oz (57.3 kg)  Height:  5\' 7"  (1.702 m)      Constitution: NAD, lying supine in bed, cooperative, thin HENT: dental caries, atraumatic, normocephalic Eyes: no scleral icterus, EOM intact Cardio: regular rate & rhythm, no murmurs, rubs, gallops Respiratory: Clear to auscultation bilatearlly, non-labored breathing, no w/r/r Abdominal: +bs, NTTP, non-distended Neuro: A&Ox3, normal affect Skin: no edema present, clean, dry, intact    Assessment/Plan:  Active Problems:   Chest pain  62yo male with hx of HLD, polysubstance abuse (including cocaine & marijuana), CAD s/p bypass surgery 2012 presenting today with sharp, shooting upper abdominal/lower chest pain that began around 9am with associated diaphoresis and headache that lasted several minutes. He had a second episode in ER that lasted less than a minute and was associated with trying to shift to his side. CXR significant for COPD, otherwise WNL. EKG with newer T wave inversion compared to previous in V5-V6, otherwise consistent with previous in 2014.    #Chest Pain: Bypassx4 in 2012, has not seen cardiologist in years or been taking BP or HLD medications since the time of sx. Second round of trended troponins all  negative. Cardiology will f/u xr today then anticipate outpt follow-up with [facility] due to   - cardiology consulted - outpatient follow-up with [facility] - continue asa 81mg , atorvastatin 40mg  on discharge - trend troponin q6 x3 - ECHO pending   #HFrEF: ECHO 2010 with EF 30-35%. Has not seen cardiologist since 2012 although patient is not certain. Has not been taking medication post bypass 2012.   - Echo today  - Beta-blocker pending echo   #HLD: previously on simvastatin 40mg  qd 2012 - atorvastatin 40mg   #HTN: d/c in 2012 s/p bypass with coreg 6.25 bid, lisinopril 5mg  qd. No HTN on admission. Patient has not taken these medications in years, possibly since surgery.   DVT prophylaxis: LVX   Dispo: Anticipated discharge today.   Molli Hazard A, DO 10/17/2017, 7:16 AM Pager: [phone]

## 2017-10-17 NOTE — Progress Notes (Signed)
Progress Note  Patient Name: [name] Date of Encounter: 10/17/2017  Primary Cardiologist: Candee Furbish, MD   Subjective   No further chest pain, shortness of breath.  Inpatient Medications    Scheduled Meds: . aspirin EC  81 mg Oral Daily  . atorvastatin  40 mg Oral q1800  . enoxaparin (LOVENOX) injection  40 mg Subcutaneous Q24H  . sodium chloride flush  3 mL Intravenous Q12H   Continuous Infusions:  PRN Meds: acetaminophen **OR** acetaminophen, ondansetron **OR** ondansetron (ZOFRAN) IV, polyethylene glycol   Vital Signs    Vitals:   10/16/17 1800 10/16/17 1845 10/16/17 2111 10/17/17 0458  BP: 114/75 115/73 104/71 114/69  Pulse: 70 67 66 66  Resp: 16  16 15   Temp:  (!) 97.5 F (36.4 C) 98.1 F (36.7 C) 97.6 F (36.4 C)  TempSrc:  Oral Oral Oral  SpO2:  96% 99%   Weight:  130 lb 15.3 oz (59.4 kg)  126 lb 6.4 oz (57.3 kg)  Height:  5\' 7"  (1.702 m)      Intake/Output Summary (Last 24 hours) at 10/17/2017 0937 Last data filed at 10/17/2017 0508 Gross per 24 hour  Intake 480 ml  Output -  Net 480 ml   Filed Weights   10/16/17 1000 10/16/17 1845 10/17/17 0458  Weight: 145 lb (65.8 kg) 130 lb 15.3 oz (59.4 kg) 126 lb 6.4 oz (57.3 kg)    Telemetry    No adverse arrhythmias, T wave inversion noted- Personally Reviewed  ECG    This rhythm T wave inversions accentuated- Personally Reviewed  Physical Exam   GEN: No acute distress, thin.   Neck: No JVD, poor dentition Cardiac: RRR, no murmurs, rubs, or gallops.  Respiratory: Clear to auscultation bilaterally. GI: Soft, nontender, non-distended  MS: No edema; No deformity. Neuro:  Nonfocal  Psych: Normal affect   Labs    Chemistry Recent Labs  Lab 10/16/17 1013 10/16/17 1853  NA 140  --   K 3.8  --   CL 108  --   CO2 25  --   GLUCOSE 100*  --   BUN 11  --   CREATININE 0.90 0.99  CALCIUM 9.4  --   GFRNONAA >60 >60  GFRAA >60 >60  ANIONGAP 7  --      Hematology Recent Labs  Lab  10/16/17 1013 10/16/17 1853  WBC 3.2* 3.3*  RBC 4.66 4.61  HGB 14.8 14.7  HCT 45.5 44.4  MCV 97.6 96.3  MCH 31.8 31.9  MCHC 32.5 33.1  RDW 13.2 13.2  PLT 216 239    Cardiac Enzymes Recent Labs  Lab 10/16/17 1853 10/16/17 2129 10/17/17 0335  TROPONINI <0.03 <0.03 <0.03    Recent Labs  Lab 10/16/17 1023 10/16/17 1403  TROPIPOC 0.00 0.00     BNPNo results for input(s): BNP, PROBNP in the last 168 hours.   DDimer No results for input(s): DDIMER in the last 168 hours.   Radiology    Dg Chest 2 View  Result Date: 10/16/2017 CLINICAL DATA:  Onset of sharp chest pain today with mild shortness of breath. History of previous CABG, current smoker. EXAM: CHEST - 2 VIEW COMPARISON:  Chest x-ray of December 29, 2012 FINDINGS: The lungs are mildly hyperinflated. There is no focal infiltrate. The interstitial markings are mildly prominent though stable. The heart and pulmonary vascularity are normal. The patient has undergone previous CABG. There is calcification in the wall of the aortic arch. There is no pleural  effusion or pneumothorax. There is mild multilevel degenerative disc disease of the thoracic spine. IMPRESSION: COPD-smoking related changes.  Previous CABG.  No pneumonia nor CHF. Thoracic aortic atherosclerosis. Electronically Signed   By: David  Martinique M.D.   On: 10/16/2017 10:42    Cardiac Studies   Awaiting echocardiogram  Patient Profile     62 y.o. male with known coronary artery disease status post CABG 2010 with prior ischemic cardiomyopathy ejection fraction 30 to 35%, smoker, former cocaine user, states he has not used in 3 to 4 months here with atypical chest pain  Assessment & Plan    Atypical chest pain with known CAD status post CABG and LV dysfunction -Awaiting echocardiogram.  Troponins are reassuring, normal.  If ejection fraction remains low, try to place on beta-blocker/ACE inhibitor for cardiomyopathy.  He has been lost to follow-up for several  years.  Tobacco use -Pack of cigarettes per week.  Cessation advised.  Prior history of cocaine use -Strongly encourage cessation.  Increased risks of death.  Long-term leukopenia.     For questions or updates, please contact [facility] Please consult [email or web address] for contact info under Cardiology/STEMI.      Signed, Candee Furbish, MD  10/17/2017, 9:37 AM

## 2017-10-17 NOTE — Progress Notes (Signed)
  Echocardiogram 2D Echocardiogram has been performed.  [name] 10/17/2017, 10:40 AM

## 2017-10-18 ENCOUNTER — Telehealth: Payer: Self-pay | Admitting: Cardiology

## 2017-10-18 NOTE — Telephone Encounter (Signed)
Called number and spoke with receptionist at the center.  She is unaware of who this pt is.  She said it may be a new pt at the center and group meeting has ended for the day.  She took information and will see if pt is there when pt's return for group tomorrow and have him call back.

## 2017-10-18 NOTE — Telephone Encounter (Signed)
New message    Pt states that his insurance will not cover his medications. Atorvastatin 40 mg and Lisinopril 5 mg. He wants to know if they can be changed.

## 2017-10-19 MED ORDER — LISINOPRIL 5 MG PO TABS: 5.0000 mg | ORAL_TABLET | Freq: Every day | ORAL | 11 refills | Status: DC

## 2017-10-19 MED ORDER — ATORVASTATIN CALCIUM 40 MG PO TABS: 40.0000 mg | ORAL_TABLET | Freq: Every day | ORAL | 11 refills | Status: DC

## 2017-10-19 NOTE — Telephone Encounter (Signed)
Fine with lisinopril 5mg  PO QD Candee Furbish, MD

## 2017-10-19 NOTE — Telephone Encounter (Signed)
Patient called to report his Lipitor and lisinopril are too expensive because they are not covered by his insurance.  Called [facility] who states the MD who called in the medication is not a Medicaid provider and a doctor who is needs to call in the rx.   After chart review, there is no clear cardiology dictation to start Lisinopril 5 mg (and there is documentation BP is low to start). Called in atorvastatin. Informed patient Dr. Marlou Porch will be asked about lisinopril and he will be called to initiate if needed prior to Dr. Marlou Porch' office visit in a couple weeks.  He was grateful for assistance.

## 2017-10-19 NOTE — Telephone Encounter (Signed)
Lisinopril 5 mg daily called in to pharmacy.  Called and confirmed with [facility] that both prescriptions have been called in (the patient has no working phone at this time). If he does not get the medication by Monday, she will have him pick it up.

## 2017-11-08 ENCOUNTER — Ambulatory Visit: Payer: Medicaid Other | Admitting: Cardiology

## 2017-11-08 NOTE — Progress Notes (Deleted)
Cardiology Office Note:    Date:  11/08/2017   ID:  [name], DOB 1956/02/01, MRN [id number]  PCP:  Patient, No Pcp Per  Cardiologist:  Candee Furbish, MD   Referring MD: No ref. provider found   No chief complaint on file. ***  History of Present Illness:    [name] is a 62 y.o. male in the hospital late July 2019 with known coronary artery disease status post CABG 2010 with prior ischemic cardiomyopathy EF 30 to 35%, smoker, former cocaine use with atypical chest discomfort.  Troponins were reassuring.  Past Medical History:  Diagnosis Date  . CAD (coronary artery disease)    a. 04/2008 Cath: LM nl, LAD 50p, 61m, LCX min irregs, RCA 50p/m, 40d, RPDA 60-70ost; b. 04/2008 CABG x 4: LIMA->LAD, VG->D2, VG->RPDA->RPL.  . Cocaine abuse ([place])    a. Quit early 2019.  . Ischemic cardiomyopathy    a. LV gram: EF 30-35%, apical/periapical AK.  . Tobacco abuse     Past Surgical History:  Procedure Laterality Date  . BYPASS GRAFT    . CARDIAC SURGERY      Current Medications: No outpatient medications have been marked as taking for the 11/08/17 encounter (Appointment) with Jerline Pain, MD.     Allergies:   Patient has no known allergies.   Social History   Socioeconomic History  . Marital status: Single    Spouse name: Not on file  . Number of children: Not on file  . Years of education: Not on file  . Highest education level: Not on file  Occupational History  . Not on file  Social Needs  . Financial resource strain: Not on file  . Food insecurity:    Worry: Not on file    Inability: Not on file  . Transportation needs:    Medical: Not on file    Non-medical: Not on file  Tobacco Use  . Smoking status: Current Every Day Smoker    Packs/day: 0.30    Types: Cigarettes  . Smokeless tobacco: Current User  . Tobacco comment: has smoked for 40+ yrs - at most 1ppd, currently 1ppwk.  Substance and Sexual Activity  . Alcohol use: No    Comment: quite > 30  yrs ago.  . Drug use: No    Comment: prev used cocaine - quit early 2019.  Marland Kitchen Sexual activity: Not on file  Lifestyle  . Physical activity:    Days per week: Not on file    Minutes per session: Not on file  . Stress: Not on file  Relationships  . Social connections:    Talks on phone: Not on file    Gets together: Not on file    Attends religious service: Not on file    Active member of club or organization: Not on file    Attends meetings of clubs or organizations: Not on file    Relationship status: Not on file  Other Topics Concern  . Not on file  Social History Narrative   Lives in [place] by himself.  Currently staying in a half-way house designed to help those addicted to drugs get clean, and find housing/employment.  He exercises some - walks/push ups.     Family History: No early CAD  ROS:   Please see the history of present illness.     All other systems reviewed and are negative.  EKGs/Labs/Other Studies Reviewed:    The following studies were reviewed today: ECHO 10/17/17: -  Left ventricle: Septal and apical akinesis mid and basal inferior   wall hypokinesis. The cavity size was mildly dilated. Wall   thickness was normal. Systolic function was moderately reduced.   The estimated ejection fraction was in the range of 35% to 40%.   Wall motion was normal; there were no regional wall motion   abnormalities. Doppler parameters are consistent with abnormal   left ventricular relaxation (grade 1 diastolic dysfunction). - Atrial septum: No defect or patent foramen ovale was identified.   EKG:  EKG is *** ordered today.  The ekg ordered today demonstrates ***  Recent Labs: 10/16/2017: BUN 11; Creatinine, Ser 0.99; Hemoglobin 14.7; Platelets 239; Potassium 3.8; Sodium 140  Recent Lipid Panel    Component Value Date/Time   CHOL 272 (H) 10/16/2017 1853   TRIG 174 (H) 10/16/2017 1853   HDL 71 10/16/2017 1853   CHOLHDL 3.8 10/16/2017 1853   VLDL 35 10/16/2017 1853    LDLCALC 166 (H) 10/16/2017 1853    Physical Exam:    VS:  There were no vitals taken for this visit.    Wt Readings from Last 3 Encounters:  10/17/17 126 lb 6.4 oz (57.3 kg)  03/31/15 130 lb (59 kg)     GEN: *** Well nourished, well developed in no acute distress HEENT: Normal NECK: No JVD; No carotid bruits LYMPHATICS: No lymphadenopathy CARDIAC: ***RRR, no murmurs, rubs, gallops RESPIRATORY:  Clear to auscultation without rales, wheezing or rhonchi  ABDOMEN: Soft, non-tender, non-distended MUSCULOSKELETAL:  No edema; No deformity  SKIN: Warm and dry NEUROLOGIC:  Alert and oriented x 3 PSYCHIATRIC:  Normal affect   ASSESSMENT:    No diagnosis found. PLAN:    In order of problems listed above:  Dilated cardiomyopathy, chronic systolic heart failure EF 35-40   Medication Adjustments/Labs and Tests Ordered: Current medicines are reviewed at length with the patient today.  Concerns regarding medicines are outlined above.  No orders of the defined types were placed in this encounter.  No orders of the defined types were placed in this encounter.   There are no Patient Instructions on file for this visit.   Signed, Candee Furbish, MD  11/08/2017 9:13 AM    [facility]

## 2017-11-19 ENCOUNTER — Ambulatory Visit (INDEPENDENT_AMBULATORY_CARE_PROVIDER_SITE_OTHER): Payer: Medicaid Other | Admitting: Cardiology

## 2017-11-19 ENCOUNTER — Encounter: Payer: Self-pay | Admitting: Cardiology

## 2017-11-19 VITALS — BP 96/60 | HR 71 | Ht 67.0 in | Wt 135.0 lb

## 2017-11-19 DIAGNOSIS — Z720 Tobacco use: Secondary | ICD-10-CM

## 2017-11-19 DIAGNOSIS — E785 Hyperlipidemia, unspecified: Secondary | ICD-10-CM

## 2017-11-19 DIAGNOSIS — I5022 Chronic systolic (congestive) heart failure: Secondary | ICD-10-CM

## 2017-11-19 DIAGNOSIS — Z79899 Other long term (current) drug therapy: Secondary | ICD-10-CM | POA: Diagnosis not present

## 2017-11-19 DIAGNOSIS — I2510 Atherosclerotic heart disease of native coronary artery without angina pectoris: Secondary | ICD-10-CM

## 2017-11-19 NOTE — Patient Instructions (Addendum)
Medication Instructions:  The current medical regimen is effective;  continue present plan and medications.  Lab:  Please have blood work today (Lipid/ALT)  Follow-Up: Follow up in 6 months with Cecilie Kicks, NP.  You will receive a letter in the mail 2 months before you are due.  Please call us when you receive this letter to schedule your follow up appointment.  Follow up in 1 year with Dr. Marlou Porch.  You will receive a letter in the mail 2 months before you are due.  Please call us when you receive this letter to schedule your follow up appointment.  If you need a refill on your cardiac medications before your next appointment, please call your pharmacy.  Thank you for choosing [facility]!!     Coping with Quitting Smoking Quitting smoking is a physical and mental challenge. You will face cravings, withdrawal symptoms, and temptation. Before quitting, work with your health care provider to make a plan that can help you cope. Preparation can help you quit and keep you from giving in. How can I cope with cravings? Cravings usually last for 5-10 minutes. If you get through it, the craving will pass. Consider taking the following actions to help you cope with cravings:  Keep your mouth busy: ? Chew sugar-free gum. ? Suck on hard candies or a straw. ? Brush your teeth.  Keep your hands and body busy: ? Immediately change to a different activity when you feel a craving. ? Squeeze or play with a ball. ? Do an activity or a hobby, like making bead jewelry, practicing needlepoint, or working with wood. ? Mix up your normal routine. ? Take a short exercise break. Go for a quick walk or run up and down stairs. ? Spend time in public places where smoking is not allowed.  Focus on doing something kind or helpful for someone else.  Call a friend or family member to talk during a craving.  Join a support group.  Call a quit line, such as 1-800-QUIT-NOW.  Talk with your health  care provider about medicines that might help you cope with cravings and make quitting easier for you.  How can I deal with withdrawal symptoms? Your body may experience negative effects as it tries to get used to not having nicotine in the system. These effects are called withdrawal symptoms. They may include:  Feeling hungrier than normal.  Trouble concentrating.  Irritability.  Trouble sleeping.  Feeling depressed.  Restlessness and agitation.  Craving a cigarette.  To manage withdrawal symptoms:  Avoid places, people, and activities that trigger your cravings.  Remember why you want to quit.  Get plenty of sleep.  Avoid coffee and other caffeinated drinks. These may worsen some of your symptoms.  How can I handle social situations? Social situations can be difficult when you are quitting smoking, especially in the first few weeks. To manage this, you can:  Avoid parties, bars, and other social situations where people might be smoking.  Avoid alcohol.  Leave right away if you have the urge to smoke.  Explain to your family and friends that you are quitting smoking. Ask for understanding and support.  Plan activities with friends or family where smoking is not an option.  What are some ways I can cope with stress? Wanting to smoke may cause stress, and stress can make you want to smoke. Find ways to manage your stress. Relaxation techniques can help. For example:  Breathe slowly and deeply, in through  your nose and out through your mouth.  Listen to soothing, relaxing music.  Talk with a family member or friend about your stress.  Light a candle.  Soak in a bath or take a shower.  Think about a peaceful place.  What are some ways I can prevent weight gain? Be aware that many people gain weight after they quit smoking. However, not everyone does. To keep from gaining weight, have a plan in place before you quit and stick to the plan after you quit. Your plan  should include:  Having healthy snacks. When you have a craving, it may help to: ? Eat plain popcorn, crunchy carrots, celery, or other cut vegetables. ? Chew sugar-free gum.  Changing how you eat: ? Eat small portion sizes at meals. ? Eat 4-6 small meals throughout the day instead of 1-2 large meals a day. ? Be mindful when you eat. Do not watch television or do other things that might distract you as you eat.  Exercising regularly: ? Make time to exercise each day. If you do not have time for a long workout, do short bouts of exercise for 5-10 minutes several times a day. ? Do some form of strengthening exercise, like weight lifting, and some form of aerobic exercise, like running or swimming.  Drinking plenty of water or other low-calorie or no-calorie drinks. Drink 6-8 glasses of water daily, or as much as instructed by your health care provider.  Summary  Quitting smoking is a physical and mental challenge. You will face cravings, withdrawal symptoms, and temptation to smoke again. Preparation can help you as you go through these challenges.  You can cope with cravings by keeping your mouth busy (such as by chewing gum), keeping your body and hands busy, and making calls to family, friends, or a helpline for people who want to quit smoking.  You can cope with withdrawal symptoms by avoiding places where people smoke, avoiding drinks with caffeine, and getting plenty of rest.  Ask your health care provider about the different ways to prevent weight gain, avoid stress, and handle social situations. This information is not intended to replace advice given to you by your health care provider. Make sure you discuss any questions you have with your health care provider. Document Released: 03/10/2016 Document Revised: 03/10/2016 Document Reviewed: 03/10/2016 Elsevier Interactive Patient Education  [employer].

## 2017-11-19 NOTE — Progress Notes (Signed)
Cardiology Office Note:    Date:  11/19/2017   ID:  [name], DOB [birth year], MRN [id number]  PCP:  Patient, No Pcp Per  Cardiologist:  Candee Furbish, MD   Referring MD: No ref. provider found    History of Present Illness:    [name] is a 62 y.o. male originally seen in consultation on 10/16/2017 in the hospital for chest discomfort with coronary artery disease status post CABG with prior cocaine use, ischemic cardiomyopathy EF 35-40%, tobacco use here for follow-up.  Prior to that encounter, he had not been seen by cardiology in several years.  He was taking no medications at that time.  He was staying at home in [place] to help individuals with drug addictions obtain employment.  He is active.  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  He done quite well since his bypass surgery in 2010.  His pain episode was sharp left-sided approximately 1 to 2 minutes duration dull aching.  Inferolateral T wave inversions were not more pronounced than on prior EKG in 2014.  Troponins were normal.  Chest pain-free.  Overall he has been doing well without any further episodes of chest pain.  Still struggling with smoking.  He is in need of dental extractions.  No prior valve replacement, valve surgery in conjunction with his CABG.  Past Medical History:  Diagnosis Date  . CAD (coronary artery disease)    a. 04/2008 Cath: LM nl, LAD 50p, 58m, LCX min irregs, RCA 50p/m, 40d, RPDA 60-70ost; b. 04/2008 CABG x 4: LIMA->LAD, VG->D2, VG->RPDA->RPL.  . Cocaine abuse ([place])    a. Quit early 2019.  . Ischemic cardiomyopathy    a. LV gram: EF 30-35%, apical/periapical AK.  . Tobacco abuse     Past Surgical History:  Procedure Laterality Date  . BYPASS GRAFT    . CARDIAC SURGERY      Current Medications: Current Meds  Medication Sig  . aspirin EC 81 MG EC tablet Take 1 tablet (81 mg total) by mouth daily.  Marland Kitchen atorvastatin (LIPITOR) 40 MG tablet  Take 1 tablet (40 mg total) by mouth daily at 6 PM.  . ibuprofen (ADVIL,MOTRIN) 400 MG tablet Take 1 tablet (400 mg total) by mouth every 6 (six) hours as needed for pain.  Marland Kitchen ibuprofen (ADVIL,MOTRIN) 600 MG tablet Take 600 mg by mouth every 6 (six) hours as needed. for pain  . lisinopril (PRINIVIL,ZESTRIL) 5 MG tablet Take 1 tablet (5 mg total) by mouth daily.  . naproxen (NAPROSYN) 500 MG tablet Take 500 mg by mouth 2 (two) times daily as needed.     Allergies:   Patient has no known allergies.   Social History   Socioeconomic History  . Marital status: Single    Spouse name: Not on file  . Number of children: Not on file  . Years of education: Not on file  . Highest education level: Not on file  Occupational History  . Not on file  Social Needs  . Financial resource strain: Not on file  . Food insecurity:    Worry: Not on file    Inability: Not on file  . Transportation needs:    Medical: Not on file    Non-medical: Not on file  Tobacco Use  . Smoking status: Current Every Day Smoker    Packs/day: 0.30    Types: Cigarettes  . Smokeless tobacco: Current User  . Tobacco comment: has smoked  for 40+ yrs - at most 1ppd, currently 1ppwk.  Substance and Sexual Activity  . Alcohol use: No    Comment: quite > 30 yrs ago.  . Drug use: No    Comment: prev used cocaine - quit early 2019.  Marland Kitchen Sexual activity: Not on file  Lifestyle  . Physical activity:    Days per week: Not on file    Minutes per session: Not on file  . Stress: Not on file  Relationships  . Social connections:    Talks on phone: Not on file    Gets together: Not on file    Attends religious service: Not on file    Active member of club or organization: Not on file    Attends meetings of clubs or organizations: Not on file    Relationship status: Not on file  Other Topics Concern  . Not on file  Social History Narrative   Lives in [place] by himself.  Currently staying in a half-way house designed to help those  addicted to drugs get clean, and find housing/employment.  He exercises some - walks/push ups.     Family History: No early family history of CAD   ROS:   Please see the history of present illness.     All other systems reviewed and are negative.  EKGs/Labs/Other Studies Reviewed:    The following studies were reviewed today: Prior hospital notes, EKG, echocardiogram, lab work  EKG:  No new.  See above for review of prior  Recent Labs: 10/16/2017: BUN 11; Creatinine, Ser 0.99; Hemoglobin 14.7; Platelets 239; Potassium 3.8; Sodium 140  Recent Lipid Panel    Component Value Date/Time   CHOL 272 (H) 10/16/2017 1853   TRIG 174 (H) 10/16/2017 1853   HDL 71 10/16/2017 1853   CHOLHDL 3.8 10/16/2017 1853   VLDL 35 10/16/2017 1853   LDLCALC 166 (H) 10/16/2017 1853    Physical Exam:    VS:  BP 96/60   Pulse 71   Ht 5\' 7"  (1.702 m)   Wt 135 lb (61.2 kg)   SpO2 97%   BMI 21.14 kg/m     Wt Readings from Last 3 Encounters:  11/19/17 135 lb (61.2 kg)  10/17/17 126 lb 6.4 oz (57.3 kg)  03/31/15 130 lb (59 kg)     GEN: Thin in no acute distress HEENT: Normal NECK: No JVD; No carotid bruits LYMPHATICS: No lymphadenopathy CARDIAC: RRR, no murmurs, rubs, gallops, CABG scar RESPIRATORY:  Clear to auscultation without rales, wheezing or rhonchi  ABDOMEN: Soft, non-tender, non-distended MUSCULOSKELETAL:  No edema; No deformity  SKIN: Warm and dry NEUROLOGIC:  Alert and oriented x 3 PSYCHIATRIC:  Normal affect   ASSESSMENT:    1. Hyperlipidemia, unspecified hyperlipidemia type   2. Long-term use of high-risk medication   3. Coronary artery disease involving native coronary artery of native heart without angina pectoris   4. Chronic systolic heart failure ([place])   5. Tobacco use    PLAN:    In order of problems listed above:  Coronary artery disease status post CABG 2010 - Overall stable no anginal symptoms.  Prior episodes were muscular skeletal type sharp pain.  Would  continue to advocate aspirin 81 mg, atorvastatin 40 mg.  His prior LDL was 166 on 10/16/2017.  Since he has been on atorvastatin reportedly, I will go ahead and check a lipid panel and ALT today.  Prior ALT several years ago was normal.  Ischemic cardiomyopathy, chronic systolic heart failure -  NYHA class I.  Not having any shortness of breath.  Blood pressures have been soft.  Unable to increase ACE inhibitor.  For now, continue with lisinopril 5.  He does have a cough this morning.  This is most likely related to his tobacco use.  Tobacco use  - cut back.  He has had challenges trying to quit.  Still struggling with this.  We discussed tobacco cessation plans.  We discussed for 4 minutes duration.  Dental work -He may have dental extractions performed.  He does not require dental antibiotic prophylaxis.  I will have him come back and see Mickel Baas in 6 months, me in 12 months.  Medication Adjustments/Labs and Tests Ordered: Current medicines are reviewed at length with the patient today.  Concerns regarding medicines are outlined above.  Orders Placed This Encounter  Procedures  . ALT  . Lipid panel   No orders of the defined types were placed in this encounter.   Patient Instructions  Medication Instructions:  The current medical regimen is effective;  continue present plan and medications.  Lab:  Please have blood work today (Lipid/ALT)  Follow-Up: Follow up in 6 months with Cecilie Kicks, NP.  You will receive a letter in the mail 2 months before you are due.  Please call us when you receive this letter to schedule your follow up appointment.  Follow up in 1 year with Dr. Marlou Porch.  You will receive a letter in the mail 2 months before you are due.  Please call us when you receive this letter to schedule your follow up appointment.  If you need a refill on your cardiac medications before your next appointment, please call your pharmacy.  Thank you for choosing [facility]!!     Coping with Quitting Smoking Quitting smoking is a physical and mental challenge. You will face cravings, withdrawal symptoms, and temptation. Before quitting, work with your health care provider to make a plan that can help you cope. Preparation can help you quit and keep you from giving in. How can I cope with cravings? Cravings usually last for 5-10 minutes. If you get through it, the craving will pass. Consider taking the following actions to help you cope with cravings:  Keep your mouth busy: ? Chew sugar-free gum. ? Suck on hard candies or a straw. ? Brush your teeth.  Keep your hands and body busy: ? Immediately change to a different activity when you feel a craving. ? Squeeze or play with a ball. ? Do an activity or a hobby, like making bead jewelry, practicing needlepoint, or working with wood. ? Mix up your normal routine. ? Take a short exercise break. Go for a quick walk or run up and down stairs. ? Spend time in public places where smoking is not allowed.  Focus on doing something kind or helpful for someone else.  Call a friend or family member to talk during a craving.  Join a support group.  Call a quit line, such as 1-800-QUIT-NOW.  Talk with your health care provider about medicines that might help you cope with cravings and make quitting easier for you.  How can I deal with withdrawal symptoms? Your body may experience negative effects as it tries to get used to not having nicotine in the system. These effects are called withdrawal symptoms. They may include:  Feeling hungrier than normal.  Trouble concentrating.  Irritability.  Trouble sleeping.  Feeling depressed.  Restlessness and agitation.  Craving a cigarette.  To manage withdrawal symptoms:  Avoid places, people, and activities that trigger your cravings.  Remember why you want to quit.  Get plenty of sleep.  Avoid coffee and other caffeinated drinks. These may worsen  some of your symptoms.  How can I handle social situations? Social situations can be difficult when you are quitting smoking, especially in the first few weeks. To manage this, you can:  Avoid parties, bars, and other social situations where people might be smoking.  Avoid alcohol.  Leave right away if you have the urge to smoke.  Explain to your family and friends that you are quitting smoking. Ask for understanding and support.  Plan activities with friends or family where smoking is not an option.  What are some ways I can cope with stress? Wanting to smoke may cause stress, and stress can make you want to smoke. Find ways to manage your stress. Relaxation techniques can help. For example:  Breathe slowly and deeply, in through your nose and out through your mouth.  Listen to soothing, relaxing music.  Talk with a family member or friend about your stress.  Light a candle.  Soak in a bath or take a shower.  Think about a peaceful place.  What are some ways I can prevent weight gain? Be aware that many people gain weight after they quit smoking. However, not everyone does. To keep from gaining weight, have a plan in place before you quit and stick to the plan after you quit. Your plan should include:  Having healthy snacks. When you have a craving, it may help to: ? Eat plain popcorn, crunchy carrots, celery, or other cut vegetables. ? Chew sugar-free gum.  Changing how you eat: ? Eat small portion sizes at meals. ? Eat 4-6 small meals throughout the day instead of 1-2 large meals a day. ? Be mindful when you eat. Do not watch television or do other things that might distract you as you eat.  Exercising regularly: ? Make time to exercise each day. If you do not have time for a long workout, do short bouts of exercise for 5-10 minutes several times a day. ? Do some form of strengthening exercise, like weight lifting, and some form of aerobic exercise, like running or  swimming.  Drinking plenty of water or other low-calorie or no-calorie drinks. Drink 6-8 glasses of water daily, or as much as instructed by your health care provider.  Summary  Quitting smoking is a physical and mental challenge. You will face cravings, withdrawal symptoms, and temptation to smoke again. Preparation can help you as you go through these challenges.  You can cope with cravings by keeping your mouth busy (such as by chewing gum), keeping your body and hands busy, and making calls to family, friends, or a helpline for people who want to quit smoking.  You can cope with withdrawal symptoms by avoiding places where people smoke, avoiding drinks with caffeine, and getting plenty of rest.  Ask your health care provider about the different ways to prevent weight gain, avoid stress, and handle social situations. This information is not intended to replace advice given to you by your health care provider. Make sure you discuss any questions you have with your health care provider. Document Released: 03/10/2016 Document Revised: 03/10/2016 Document Reviewed: 03/10/2016 Elsevier Interactive Patient Education  2018 [employer].     Signed, Candee Furbish, MD  11/19/2017 9:49 AM    [place]

## 2018-09-15 ENCOUNTER — Other Ambulatory Visit: Payer: Self-pay

## 2018-09-15 ENCOUNTER — Emergency Department (HOSPITAL_COMMUNITY)
Admission: EM | Admit: 2018-09-15 | Discharge: 2018-09-15 | Disposition: A | Discharge status: Home / Self Care | Payer: Medicaid Other | Attending: Emergency Medicine | Admitting: Emergency Medicine

## 2018-09-15 DIAGNOSIS — Z7982 Long term (current) use of aspirin: Secondary | ICD-10-CM | POA: Diagnosis not present

## 2018-09-15 DIAGNOSIS — F17210 Nicotine dependence, cigarettes, uncomplicated: Secondary | ICD-10-CM | POA: Insufficient documentation

## 2018-09-15 DIAGNOSIS — Z79899 Other long term (current) drug therapy: Secondary | ICD-10-CM | POA: Insufficient documentation

## 2018-09-15 DIAGNOSIS — R590 Localized enlarged lymph nodes: Secondary | ICD-10-CM | POA: Insufficient documentation

## 2018-09-15 DIAGNOSIS — I259 Chronic ischemic heart disease, unspecified: Secondary | ICD-10-CM | POA: Insufficient documentation

## 2018-09-15 DIAGNOSIS — R1032 Left lower quadrant pain: Secondary | ICD-10-CM | POA: Diagnosis present

## 2018-09-15 LAB — URINALYSIS, ROUTINE W REFLEX MICROSCOPIC
Bilirubin Urine: NEGATIVE
Glucose, UA: NEGATIVE mg/dL
Hgb urine dipstick: NEGATIVE
Ketones, ur: NEGATIVE mg/dL
Leukocytes,Ua: NEGATIVE
Nitrite: NEGATIVE
Protein, ur: NEGATIVE mg/dL
Specific Gravity, Urine: 1.024 (ref 1.005–1.030)
pH: 5 (ref 5.0–8.0)

## 2018-09-15 LAB — CBC WITH DIFFERENTIAL/PLATELET
Abs Immature Granulocytes: 0.01 10*3/uL (ref 0.00–0.07)
Basophils Absolute: 0 10*3/uL (ref 0.0–0.1)
Basophils Relative: 1 %
Eosinophils Absolute: 0.1 10*3/uL (ref 0.0–0.5)
Eosinophils Relative: 1 %
HCT: 40.5 % (ref 39.0–52.0)
Hemoglobin: 13.4 g/dL (ref 13.0–17.0)
Immature Granulocytes: 0 %
Lymphocytes Relative: 17 %
Lymphs Abs: 0.8 10*3/uL (ref 0.7–4.0)
MCH: 31.9 pg (ref 26.0–34.0)
MCHC: 33.1 g/dL (ref 30.0–36.0)
MCV: 96.4 fL (ref 80.0–100.0)
Monocytes Absolute: 0.3 10*3/uL (ref 0.1–1.0)
Monocytes Relative: 6 %
Neutro Abs: 3.3 10*3/uL (ref 1.7–7.7)
Neutrophils Relative %: 75 %
Platelets: 282 10*3/uL (ref 150–400)
RBC: 4.2 MIL/uL — ABNORMAL LOW (ref 4.22–5.81)
RDW: 12.5 % (ref 11.5–15.5)
WBC: 4.4 10*3/uL (ref 4.0–10.5)
nRBC: 0 % (ref 0.0–0.2)

## 2018-09-15 LAB — BASIC METABOLIC PANEL
Anion gap: 11 (ref 5–15)
BUN: 16 mg/dL (ref 8–23)
CO2: 23 mmol/L (ref 22–32)
Calcium: 9.1 mg/dL (ref 8.9–10.3)
Chloride: 104 mmol/L (ref 98–111)
Creatinine, Ser: 0.95 mg/dL (ref 0.61–1.24)
GFR calc Af Amer: >60 mL/min (ref >60)
GFR calc non Af Amer: >60 mL/min (ref >60)
Glucose, Bld: 94 mg/dL (ref 70–99)
Potassium: 4 mmol/L (ref 3.5–5.1)
Sodium: 138 mmol/L (ref 135–145)

## 2018-09-15 MED ORDER — AZITHROMYCIN 250 MG PO TABS
1000.0000 mg | ORAL_TABLET | Freq: Once | ORAL | Status: AC
  Administered 2018-09-15: 1000 mg via ORAL
  Filled 2018-09-15: qty 4

## 2018-09-15 MED ORDER — CEFTRIAXONE SODIUM 250 MG IJ SOLR
250.0000 mg | Freq: Once | INTRAMUSCULAR | Status: AC
  Administered 2018-09-15: 250 mg via INTRAMUSCULAR
  Filled 2018-09-15: qty 250

## 2018-09-15 MED ORDER — DOXYCYCLINE HYCLATE 100 MG PO CAPS: 100.0000 mg | ORAL_CAPSULE | Freq: Two times a day (BID) | ORAL | 0 refills | Status: DC

## 2018-09-15 MED ORDER — LIDOCAINE HCL (PF) 1 % IJ SOLN
INTRAMUSCULAR | Status: AC
  Administered 2018-09-15: 1.2 mL
  Filled 2018-09-15: qty 5

## 2018-09-15 NOTE — ED Triage Notes (Signed)
Pt sts he's had a knot on his L inner thigh x 1 month but that it was bigger when he woke up this morning. Denies drainage. Endorses mild pain.

## 2018-09-15 NOTE — Discharge Instructions (Addendum)
Follow up closely with your doctor for recheck.  Take antibiotic as prescribed.  Return if you have any concerns.

## 2018-09-15 NOTE — ED Notes (Signed)
Patient verbalizes understanding of discharge instructions. Opportunity for questioning and answers were provided. Armband removed by staff, pt discharged from ED home via POV.  

## 2018-09-15 NOTE — ED Provider Notes (Signed)
[facility] EMERGENCY DEPARTMENT Provider Note   CSN: [id number] Arrival date & time: 09/15/18  1145     History   Chief Complaint No chief complaint on file.   HPI [name] is a 63 y.o. male.     The history is provided by the patient. No language interpreter was used.     63 year old male presenting for evaluation of a lump on his skin.  Patient report for the past 3 weeks he noticed a non-tender mass to his left inguinal region.  It has increased in size which concerns him.  He does not complain of any significant pain to the affected area.  No fever, productive cough, abdominal pain, dysuria, hematuria, penile discharge.  No report of fever night sweats or weight loss.  Denies any injury.  He is sexually active.  No pain chest or trouble breathing.  No specific treatment tried.  Past Medical History:  Diagnosis Date  . CAD (coronary artery disease)    a. 04/2008 Cath: LM nl, LAD 50p, 47m, LCX min irregs, RCA 50p/m, 40d, RPDA 60-70ost; b. 04/2008 CABG x 4: LIMA->LAD, VG->D2, VG->RPDA->RPL.  . Cocaine abuse ([place])    a. Quit early 2019.  . Ischemic cardiomyopathy    a. LV gram: EF 30-35%, apical/periapical AK.  . Tobacco abuse     Patient Active Problem List   Diagnosis Date Noted  . Chest pain 10/16/2017  . HYPERLIPIDEMIA-MIXED 06/16/2008  . TOBACCO ABUSE 06/16/2008  . MARIJUANA ABUSE 06/16/2008  . COCAINE ABUSE 06/16/2008  . CAD, ARTERY BYPASS GRAFT 04/30/2008    Past Surgical History:  Procedure Laterality Date  . BYPASS GRAFT    . CARDIAC SURGERY          Home Medications    Prior to Admission medications   Medication Sig Start Date End Date Taking? Authorizing Provider  aspirin EC 81 MG EC tablet Take 1 tablet (81 mg total) by mouth daily. 10/18/17   Seawell, Jaimie A, DO  atorvastatin (LIPITOR) 40 MG tablet Take 1 tablet (40 mg total) by mouth daily at 6 PM. 10/19/17 10/14/18  Jerline Pain, MD  ibuprofen (ADVIL,MOTRIN) 400 MG  tablet Take 1 tablet (400 mg total) by mouth every 6 (six) hours as needed for pain. 12/29/12   Carlisle Cater, PA-C  ibuprofen (ADVIL,MOTRIN) 600 MG tablet Take 600 mg by mouth every 6 (six) hours as needed. for pain 10/23/17   [provider]  lisinopril (PRINIVIL,ZESTRIL) 5 MG tablet Take 1 tablet (5 mg total) by mouth daily. 10/19/17 10/14/18  Jerline Pain, MD  naproxen (NAPROSYN) 500 MG tablet Take 500 mg by mouth 2 (two) times daily as needed. 11/05/17   [provider]    Family History No family history on file.  Social History Social History   Tobacco Use  . Smoking status: Current Every Day Smoker    Packs/day: 0.30    Types: Cigarettes  . Smokeless tobacco: Current User  . Tobacco comment: has smoked for 40+ yrs - at most 1ppd, currently 1ppwk.  Substance Use Topics  . Alcohol use: No    Comment: quite > 30 yrs ago.  . Drug use: No    Comment: prev used cocaine - quit early 2019.     Allergies   Patient has no known allergies.   Review of Systems Review of Systems  All other systems reviewed and are negative.    Physical Exam Updated Vital Signs BP 105/72 (BP Location: Right  Arm)   Pulse 65   Temp 97.6 F (36.4 C) (Oral)   Resp 14   SpO2 98%   Physical Exam Vitals signs and nursing note reviewed.  Constitutional:      General: He is not in acute distress.    Appearance: He is well-developed.  HENT:     Head: Atraumatic.  Eyes:     Conjunctiva/sclera: Conjunctivae normal.  Neck:     Musculoskeletal: Neck supple.  Cardiovascular:     Rate and Rhythm: Normal rate and regular rhythm.     Pulses: Normal pulses.     Heart sounds: Normal heart sounds.  Pulmonary:     Effort: Pulmonary effort is normal.     Breath sounds: Normal breath sounds.  Abdominal:     Palpations: Abdomen is soft.     Tenderness: There is no abdominal tenderness.  Genitourinary:    Comments: Chaperone present during exam.  There is a 4 cm mobile subcutaneous  nodule noted to the left inguinal region without significant tenderness to palpation.  Several appreciable smaller shotty lymph nodes noted along the anterior inguinal chain.  Several small maculopapular lesion approximately 2 mm noted on top of the skin but nontender.  Circumcised penis free of lesion or rash, testicle nontender with normal lie, no obvious inguinal hernia noted.  No pulsatile mass.  No skin erythema or induration. Skin:    Findings: No rash.  Neurological:     Mental Status: He is alert.      ED Treatments / Results  Labs (all labs ordered are listed, but only abnormal results are displayed) Labs Reviewed  CBC WITH DIFFERENTIAL/PLATELET - Abnormal; Notable for the following components:      Result Value   RBC 4.20 (*)    All other components within normal limits  BASIC METABOLIC PANEL  URINALYSIS, ROUTINE W REFLEX MICROSCOPIC  RPR  HIV ANTIBODY (ROUTINE TESTING W REFLEX)  GC/CHLAMYDIA PROBE AMP ([facility]) NOT AT [facility]    EKG    Radiology No results found.  Procedures Procedures (including critical care time)  Medications Ordered in ED Medications  cefTRIAXone (ROCEPHIN) injection 250 mg (250 mg Intramuscular Given 09/15/18 1230)  azithromycin (ZITHROMAX) tablet 1,000 mg (1,000 mg Oral Given 09/15/18 1229)  lidocaine (PF) (XYLOCAINE) 1 % injection (1.2 mLs  Given 09/15/18 1230)     Initial Impression / Assessment and Plan / ED Course  I have reviewed the triage vital signs and the nursing notes.  Pertinent labs & imaging results that were available during my care of the patient were reviewed by me and considered in my medical decision making (see chart for details).        BP 105/72 (BP Location: Right Arm)   Pulse 65   Temp 97.6 F (36.4 C) (Oral)   Resp 14   SpO2 98%    Final Clinical Impressions(s) / ED Diagnoses   Final diagnoses:  Inguinal lymphadenopathy    ED Discharge Orders         Ordered    doxycycline (VIBRAMYCIN) 100 MG  capsule  2 times daily     09/15/18 1322         12:10 PM Patient here with a subcutaneous mobile nodule to his left inguinal region likely a lymphadenopathy follows with multiple small shotty lymph apathy.  Is sexually active therefore will screen for potential STI.  I did discuss care with Dr. Vallery Ridge who has also evaluated patient.  We felt this is not likely to  be a pseudoaneurysm or an abscess.  Low suspicion for lipoma.  Will perform screening labs and will treat for potential STI.  Patient would likely benefit from further rechecked by PCP.  1:21 PM Patient's labs are reassuring.  Due to potential infection, will discharge home with doxycycline.  Encourage patient to follow-up closely with PCP for recheck.  Return precaution discussed.  Patient voiced understanding and agrees with plan.   Domenic Moras, PA-C 09/15/18 1323    Charlesetta Shanks, MD 09/16/18 0800

## 2018-09-16 LAB — HIV ANTIBODY (ROUTINE TESTING W REFLEX): HIV Screen 4th Generation wRfx: NONREACTIVE

## 2018-09-16 LAB — RPR: RPR Ser Ql: NONREACTIVE

## 2018-09-16 LAB — GC/CHLAMYDIA PROBE AMP (~~LOC~~) NOT AT ARMC
Chlamydia: NEGATIVE
Neisseria Gonorrhea: NEGATIVE

## 2018-10-05 ENCOUNTER — Other Ambulatory Visit: Payer: Self-pay

## 2018-10-05 ENCOUNTER — Emergency Department (HOSPITAL_COMMUNITY)
Admission: EM | Admit: 2018-10-05 | Discharge: 2018-10-05 | Disposition: A | Discharge status: Home / Self Care | Payer: Medicaid Other | Attending: Emergency Medicine | Admitting: Emergency Medicine

## 2018-10-05 ENCOUNTER — Encounter (HOSPITAL_COMMUNITY): Payer: Self-pay | Admitting: *Deleted

## 2018-10-05 DIAGNOSIS — Z79899 Other long term (current) drug therapy: Secondary | ICD-10-CM | POA: Diagnosis not present

## 2018-10-05 DIAGNOSIS — Z7982 Long term (current) use of aspirin: Secondary | ICD-10-CM | POA: Insufficient documentation

## 2018-10-05 DIAGNOSIS — F17228 Nicotine dependence, chewing tobacco, with other nicotine-induced disorders: Secondary | ICD-10-CM | POA: Insufficient documentation

## 2018-10-05 DIAGNOSIS — Z951 Presence of aortocoronary bypass graft: Secondary | ICD-10-CM | POA: Insufficient documentation

## 2018-10-05 DIAGNOSIS — I2510 Atherosclerotic heart disease of native coronary artery without angina pectoris: Secondary | ICD-10-CM | POA: Insufficient documentation

## 2018-10-05 DIAGNOSIS — F17210 Nicotine dependence, cigarettes, uncomplicated: Secondary | ICD-10-CM | POA: Insufficient documentation

## 2018-10-05 DIAGNOSIS — R1909 Other intra-abdominal and pelvic swelling, mass and lump: Secondary | ICD-10-CM | POA: Diagnosis not present

## 2018-10-05 MED ORDER — CEPHALEXIN 500 MG PO CAPS: 500.0000 mg | ORAL_CAPSULE | Freq: Four times a day (QID) | ORAL | 0 refills | Status: DC

## 2018-10-05 NOTE — ED Provider Notes (Signed)
[facility] EMERGENCY DEPARTMENT Provider Note   CSN: [id number] Arrival date & time: 10/05/18  0175     History   Chief Complaint Chief Complaint  Patient presents with  . Leg Pain    HPI [name] is a 63 y.o. male with a hx of tobacco abuse, cocaine abuse, marijuana abuse, hyperlipidemia, & CAD who returns to the ED w/ complaints of skin lump to the L inguinal area x 1 month. Patient states the lump has been present for a little over 1 month, he was seen in the ED for this 09/15/18- was placed on doxycycline to cover for infection & instructed to follow up w/ PCP. He took doxy without much change, he tried to follow up with PCP but states that their office would not see him because he had not been there in so long. The lump has recently became painful & somewhat red. No alleviating/aggravating factors. Denies urinary sxs, penile discharge, recent new sexual partners since last visit, nausea, or vomiting.      HPI  Past Medical History:  Diagnosis Date  . CAD (coronary artery disease)    a. 04/2008 Cath: LM nl, LAD 50p, 15m, LCX min irregs, RCA 50p/m, 40d, RPDA 60-70ost; b. 04/2008 CABG x 4: LIMA->LAD, VG->D2, VG->RPDA->RPL.  . Cocaine abuse ([place])    a. Quit early 2019.  . Ischemic cardiomyopathy    a. LV gram: EF 30-35%, apical/periapical AK.  . Tobacco abuse     Patient Active Problem List   Diagnosis Date Noted  . Chest pain 10/16/2017  . HYPERLIPIDEMIA-MIXED 06/16/2008  . TOBACCO ABUSE 06/16/2008  . MARIJUANA ABUSE 06/16/2008  . COCAINE ABUSE 06/16/2008  . CAD, ARTERY BYPASS GRAFT 04/30/2008    Past Surgical History:  Procedure Laterality Date  . BYPASS GRAFT    . CARDIAC SURGERY          Home Medications    Prior to Admission medications   Medication Sig Start Date End Date Taking? Authorizing Provider  aspirin EC 81 MG EC tablet Take 1 tablet (81 mg total) by mouth daily. 10/18/17   Seawell, Jaimie A, DO  atorvastatin (LIPITOR) 40  MG tablet Take 1 tablet (40 mg total) by mouth daily at 6 PM. 10/19/17 10/14/18  Jerline Pain, MD  doxycycline (VIBRAMYCIN) 100 MG capsule Take 1 capsule (100 mg total) by mouth 2 (two) times daily. One po bid x 7 days 09/15/18   Domenic Moras, PA-C  ibuprofen (ADVIL,MOTRIN) 400 MG tablet Take 1 tablet (400 mg total) by mouth every 6 (six) hours as needed for pain. 12/29/12   Carlisle Cater, PA-C  ibuprofen (ADVIL,MOTRIN) 600 MG tablet Take 600 mg by mouth every 6 (six) hours as needed. for pain 10/23/17   [provider]  lisinopril (PRINIVIL,ZESTRIL) 5 MG tablet Take 1 tablet (5 mg total) by mouth daily. 10/19/17 10/14/18  Jerline Pain, MD  naproxen (NAPROSYN) 500 MG tablet Take 500 mg by mouth 2 (two) times daily as needed. 11/05/17   [provider]    Family History No family history on file.  Social History Social History   Tobacco Use  . Smoking status: Current Every Day Smoker    Packs/day: 0.30    Types: Cigarettes  . Smokeless tobacco: Current User  . Tobacco comment: has smoked for 40+ yrs - at most 1ppd, currently 1ppwk.  Substance Use Topics  . Alcohol use: No    Comment: quite > 30 yrs ago.  Marland Kitchen  Drug use: No    Comment: prev used cocaine - quit early 2019.     Allergies   Patient has no known allergies.   Review of Systems Review of Systems  Constitutional: Negative for chills and fever.  Respiratory: Negative for shortness of breath.   Cardiovascular: Negative for chest pain.  Gastrointestinal: Negative for abdominal pain.  Genitourinary: Negative for discharge, dysuria, penile swelling, scrotal swelling and testicular pain.  Skin: Positive for color change.       + for painful skin lump  Neurological: Negative for weakness and numbness.  All other systems reviewed and are negative.   Physical Exam Updated Vital Signs BP 112/81 (BP Location: Right Arm)   Pulse 62   Temp 97.6 F (36.4 C) (Oral)   Resp 16   Ht 5\' 7"  (1.702 m)   Wt 59 kg    SpO2 97%   BMI 20.36 kg/m   Physical Exam Vitals signs and nursing note reviewed. Exam conducted with a chaperone present.  Constitutional:      General: He is not in acute distress.    Appearance: He is well-developed. He is not toxic-appearing.  HENT:     Head: Normocephalic and atraumatic.  Eyes:     General:        Right eye: No discharge.        Left eye: No discharge.     Conjunctiva/sclera: Conjunctivae normal.  Neck:     Musculoskeletal: Neck supple.  Cardiovascular:     Rate and Rhythm: Normal rate and regular rhythm.  Pulmonary:     Effort: Pulmonary effort is normal. No respiratory distress.     Breath sounds: Normal breath sounds. No wheezing, rhonchi or rales.  Abdominal:     General: There is no distension.     Palpations: Abdomen is soft.     Tenderness: There is no abdominal tenderness. There is no guarding or rebound.  Genitourinary:    Pubic Area: No rash.      Penis: Circumcised. No erythema or discharge.      Scrotum/Testes:        Right: Mass or tenderness not present.        Left: Mass or tenderness not present.     Comments: Approximately 4cm mobile palpable smooth mass noted to the left inguinal region. Very minimal warmth to the touch of this area without significant ovelrying erythema or purulent drainage. The mass is tender to palpation, not fluctuance, mildly firm. Several appreciable smaller shotty lymph nodes noted along the anterior inguinal chain. Skin:    General: Skin is warm and dry.     Findings: No rash.  Neurological:     Mental Status: He is alert.     Comments: Clear speech.   Psychiatric:        Behavior: Behavior normal.    ED Treatments / Results  Labs (all labs ordered are listed, but only abnormal results are displayed) Labs Reviewed - No data to display  EKG None  Radiology No results found.  Procedures Procedures (including critical care time)  Medications Ordered in ED Medications - No data to display    Initial Impression / Assessment and Plan / ED Course  I have reviewed the triage vital signs and the nursing notes.  Pertinent labs & imaging results that were available during my care of the patient were reviewed by me and considered in my medical decision making (see chart for details).   Patient returns to the ED for  L inguinal mass that is now painful.  Recently seen for same, not painful at that time, had work-up including CBC, BMP, GC/chlamydia, HIV, RPR-- labs reviewed, reassuring, no significant leukocytosis/anemia/STDs therefore do not feel this needs repeating today. Today mass has become painful, patient reports erythema this is not really appreciated on my exam, however the area is mildly warm to the touch. There is no fluctuance to indicate abscess. Suspect this is some type of cyst, but it is difficult to discern. Discussed findings & plan of care w/ supervising physician Dr. Johnney Killian who evaluated patient on prior ER visit- recommends keflex w/ outpatient follow up- in agreement w/ general surgery referral. I discussed treatment plan, need for follow-up, and return precautions with the patient. Provided opportunity for questions, patient confirmed understanding and is in agreement with plan.   Final Clinical Impressions(s) / ED Diagnoses   Final diagnoses:  Mass of left inguinal region    ED Discharge Orders         Ordered    cephALEXin (KEFLEX) 500 MG capsule  4 times daily     10/05/18 0951           Amaryllis Dyke, PA-C 10/05/18 2883    Charlesetta Shanks, MD 10/06/18 [phone]

## 2018-10-05 NOTE — ED Notes (Signed)
Patient verbalizes understanding of discharge instructions. Opportunity for questioning and answers were provided. Armband removed by staff, pt discharged from ED.  

## 2018-10-05 NOTE — ED Triage Notes (Signed)
C/o pain and swelling to left upper inner thigh, onset 1 month ago, states it wasn't hurting now its hurting.

## 2018-10-05 NOTE — Discharge Instructions (Addendum)
You were seen in the emergency department today for a painful mass to your left groin area.  We are putting you on Keflex, antibiotic, to help treat for infection.  Please take this as prescribed.  We have prescribed you new medication(s) today. Discuss the medications prescribed today with your pharmacist as they can have adverse effects and interactions with your other medicines including over the counter and prescribed medications. Seek medical evaluation if you start to experience new or abnormal symptoms after taking one of these medicines, seek care immediately if you start to experience difficulty breathing, feeling of your throat closing, facial swelling, or rash as these could be indications of a more serious allergic reaction  We would like you to follow-up with general surgery for further evaluation of this mass, please call to make an appointment within 1 week.  Return to the ER for new or worsening symptoms or any other concerns.

## 2018-10-17 ENCOUNTER — Encounter: Payer: Self-pay | Admitting: Cardiology

## 2018-10-17 ENCOUNTER — Ambulatory Visit (INDEPENDENT_AMBULATORY_CARE_PROVIDER_SITE_OTHER): Payer: Medicaid Other | Admitting: Cardiology

## 2018-10-17 ENCOUNTER — Telehealth: Payer: Self-pay | Admitting: Cardiology

## 2018-10-17 ENCOUNTER — Other Ambulatory Visit: Payer: Self-pay

## 2018-10-17 VITALS — BP 96/64 | HR 60 | Ht 67.0 in | Wt 119.1 lb

## 2018-10-17 DIAGNOSIS — I5022 Chronic systolic (congestive) heart failure: Secondary | ICD-10-CM | POA: Diagnosis not present

## 2018-10-17 DIAGNOSIS — I255 Ischemic cardiomyopathy: Secondary | ICD-10-CM

## 2018-10-17 DIAGNOSIS — I2510 Atherosclerotic heart disease of native coronary artery without angina pectoris: Secondary | ICD-10-CM

## 2018-10-17 DIAGNOSIS — E785 Hyperlipidemia, unspecified: Secondary | ICD-10-CM

## 2018-10-17 DIAGNOSIS — Z720 Tobacco use: Secondary | ICD-10-CM

## 2018-10-17 DIAGNOSIS — R634 Abnormal weight loss: Secondary | ICD-10-CM

## 2018-10-17 DIAGNOSIS — I959 Hypotension, unspecified: Secondary | ICD-10-CM

## 2018-10-17 MED ORDER — LISINOPRIL 2.5 MG PO TABS: 2.5000 mg | ORAL_TABLET | Freq: Every day | ORAL | 3 refills | Status: DC

## 2018-10-17 NOTE — Telephone Encounter (Signed)
New message         COVID-19 Pre-Screening Questions:   In the past 7 to 10 days have you had a cough,  shortness of breath, headache, congestion, fever (100 or greater) body aches, chills, sore throat, or sudden loss of taste or sense of smell? NO  Have you been around anyone with known Covid 19. NO  Have you been around anyone who is awaiting Covid 19 test results in the past 7 to 10 days? NO  Have you been around anyone who has been exposed to Covid 19, or has mentioned symptoms of Covid 19 within the past 7 to 10 days? NO  If you have any concerns/questions about symptoms patients report during screening (either on the phone or at threshold). Contact the provider seeing the patient or DOD for further guidance.  If neither are available contact a member of the leadership team.

## 2018-10-17 NOTE — Progress Notes (Signed)
Cardiology Office Note   Date:  10/19/2018   ID:  [name], DOB [birth year], MRN [id number]  PCP:  Patient, No Pcp Per  Cardiologist:  Dr. Marlou Porch     Chief Complaint  Patient presents with  . Cardiomyopathy      History of Present Illness: [name] is a 63 y.o. male who presents for CAD and hx CABG.  Pt was originally seen in consultation on 10/16/2017 in the hospital for chest discomfort with coronary artery disease status post CABG with prior cocaine use, ischemic cardiomyopathy EF 35-40%, tobacco use here for follow-up.  Prior to that encounter, he had not been seen by cardiology in several years.  He was taking no medications at that time.  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  He is active.  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  He done quite well since his bypass surgery in 2010.  His pain episode was sharp left-sided approximately 1 to 2 minutes duration dull aching.  Inferolateral T wave inversions were not more pronounced than on prior EKG in 2014.  Troponins were normal.  Chest pain-free.  Overall he has been doing well without any further episodes of chest pain.  Still struggling with smoking.  He is in need of dental extractions.  No prior valve replacement, valve surgery in conjunction with his CABG.  Last seen by Dr. Marlou Porch 11/19/17   Today he is doing well, weight per out scales was low but he denies wt loss. He eats 2 meals per day.  No chest pain and no SOB.  No swelling - on review of meds he takes his lisinopril prn.  With low BP today will reduce dose   He doses smoke tobacco but is down to half a pack today discussed he would benefit from stopping.  Stated he had stopped using cocaine. Denies any racing HR or palpitations.  He has no dizziness with lower BP.    Past Medical History:  Diagnosis Date  . CAD (coronary artery disease)    a.  04/2008 Cath: LM nl, LAD 50p, 23m, LCX min irregs, RCA 50p/m, 40d, RPDA 60-70ost; b. 04/2008 CABG x 4: LIMA->LAD, VG->D2, VG->RPDA->RPL.  . Cocaine abuse ([place])    a. Quit early 2019.  . Ischemic cardiomyopathy    a. LV gram: EF 30-35%, apical/periapical AK.  . Tobacco abuse     Past Surgical History:  Procedure Laterality Date  . BYPASS GRAFT    . CARDIAC SURGERY       Current Outpatient Medications  Medication Sig Dispense Refill  . aspirin EC 81 MG EC tablet Take 1 tablet (81 mg total) by mouth daily. 31 tablet 5  . atorvastatin (LIPITOR) 40 MG tablet Take 1 tablet (40 mg total) by mouth daily at 6 PM. 30 tablet 11  . ibuprofen (ADVIL,MOTRIN) 400 MG tablet Take 1 tablet (400 mg total) by mouth every 6 (six) hours as needed for pain. 20 tablet 0  . naproxen (NAPROSYN) 500 MG tablet Take 500 mg by mouth 2 (two) times daily as needed.  0  . lisinopril (ZESTRIL) 2.5 MG tablet Take 1 tablet (2.5 mg total) by mouth daily. 90 tablet 3   No current facility-administered medications for this visit.     Allergies:   Patient has no known allergies.    Social History:  The patient  reports that he has  been smoking cigarettes. He has been smoking about 0.30 packs per day. He uses smokeless tobacco. He reports that he does not drink alcohol or use drugs.   Family History:  The patient unsure of family hx      ROS:  General:no colds or fevers, possible  weight loss our scales do note 119 not sure if wt 7/11 is correct.  Skin:no rashes or ulcers HEENT:no blurred vision, no congestion CV:see HPI PUL:see HPI GI:no diarrhea constipation or melena, no indigestion GU:no hematuria, no dysuria MS:no joint pain, no claudication Neuro:no syncope, no lightheadedness Endo:no diabetes, no thyroid disease  Wt Readings from Last 3 Encounters:  10/17/18 119 lb 1.9 oz (54 kg)  10/05/18 130 lb (59 kg)  11/19/17 135 lb (61.2 kg)     PHYSICAL EXAM: VS:  BP 96/64   Pulse 60   Ht 5\' 7"  (1.702 m)    Wt 119 lb 1.9 oz (54 kg)   SpO2 96%   BMI 18.66 kg/m  , BMI Body mass index is 18.66 kg/m. General:Pleasant affect, NAD Skin:Warm and dry, brisk capillary refill HEENT:normocephalic, sclera clear, mucus membranes moist Neck:supple, no JVD, no bruits  Heart:S1S2 RRR without murmur, gallup, rub or click Lungs:clear without rales, rhonchi, or wheezes ZJQ:BHAL, non tender, + BS, do not palpate liver spleen or masses Ext:no lower ext edema, 2+ pedal pulses, 2+ radial pulses Neuro:alert and oriented X 3, MAE, follows commands, + facial symmetry    EKG:  EKG is ordered today. The ekg ordered today demonstrates SR with PAC T wave inversions V 3-5 that are chronic. Q waves V2-3 are chronic as well.    Recent Labs: 09/15/2018: BUN 16; Creatinine, Ser 0.95; Hemoglobin 13.4; Platelets 282; Potassium 4.0; Sodium 138    Lipid Panel    Component Value Date/Time   CHOL 272 (H) 10/16/2017 1853   TRIG 174 (H) 10/16/2017 1853   HDL 71 10/16/2017 1853   CHOLHDL 3.8 10/16/2017 1853   VLDL 35 10/16/2017 1853   LDLCALC 166 (H) 10/16/2017 1853       Other studies Reviewed: Additional studies/ records that were reviewed today include:  Echo 10/17/17 Study Conclusions  - Left ventricle: Septal and apical akinesis mid and basal inferior   wall hypokinesis. The cavity size was mildly dilated. Wall   thickness was normal. Systolic function was moderately reduced.   The estimated ejection fraction was in the range of 35% to 40%.   Wall motion was normal; there were no regional wall motion   abnormalities. Doppler parameters are consistent with abnormal   left ventricular relaxation (grade 1 diastolic dysfunction). - Atrial septum: No defect or patent foramen ovale was identified.  -------------------------------------------------------------------   ASSESSMENT AND PLAN:  1.  CAD with hx of CABG and no angina.  EKG stable. Active, can walk up 2 flights of steps without chest pain or SOB   2. Ischemic cardiomyopathy no signs of volume overload.   3.  Hypotension decreased lisinopril to 2.5 mg though not sure he will take  4.  Tobacco use down to half a pk per day, hopes to decrease    5.  Ing. Mass was seen in ER and waiting on community health to call for follow up  6.  HLD, will check CMP and lipids next week.   7.  possible wt loss - asked pt to monitor he denies losing 10 lbs in a few weeks.   Follow up with Dr. Marlou Porch in 6 months.   If  he needs surgery he is stable today.  Current medicines are reviewed with the patient today.  The patient Has no concerns regarding medicines.  The following changes have been made:  See above Labs/ tests ordered today include:see above  Disposition:   FU:  see above  Signed, Cecilie Kicks, NP  10/19/2018 8:49 AM    [address], [place], [place], [place] Phone: [phone]; Fax: [phone]

## 2018-10-17 NOTE — Patient Instructions (Addendum)
Medication Instructions:  Your physician has recommended you make the following change in your medication:   1. DECREASE LISINOPRIL TO 2.5 MG DAILY.   If you need a refill on your cardiac medications before your next appointment, please call your pharmacy.   Lab work: TO BE DONE NEXT Monday OR Tuesday: FASTING LIPIDS AND LFTS If you have labs (blood work) drawn today and your tests are completely normal, you will receive your results only by: Marland Kitchen MyChart Message (if you have MyChart) OR . A paper copy in the mail If you have any lab test that is abnormal or we need to change your treatment, we will call you to review the results.  Testing/Procedures: NONE  Follow-Up: At [facility], you and your health needs are our priority.  As part of our continuing mission to provide you with exceptional heart care, we have created designated Provider Care Teams.  These Care Teams include your primary Cardiologist (physician) and Advanced Practice Providers (APPs -  Physician Assistants and Nurse Practitioners) who all work together to provide you with the care you need, when you need it. You will need a follow up appointment in 6 months.  Please call our office 2 months in advance to schedule this appointment.  You may see Candee Furbish, MD or one of the following Advanced Practice Providers on your designated Care Team:   Truitt Merle, NP Cecilie Kicks, NP . Kathyrn Drown, NP  Any Other Special Instructions Will Be Listed Below (If Applicable).

## 2018-10-19 ENCOUNTER — Encounter: Payer: Self-pay | Admitting: Cardiology

## 2018-10-22 ENCOUNTER — Other Ambulatory Visit: Payer: Medicaid Other

## 2019-01-15 ENCOUNTER — Telehealth: Payer: Self-pay

## 2019-01-15 NOTE — Telephone Encounter (Signed)
Tried to call pt to set up Virtual with APP, phone number not in service.

## 2019-02-25 DIAGNOSIS — I639 Cerebral infarction, unspecified: Secondary | ICD-10-CM

## 2019-02-25 HISTORY — DX: Cerebral infarction, unspecified: I63.9

## 2019-03-04 ENCOUNTER — Emergency Department (HOSPITAL_COMMUNITY): Payer: Medicaid Other

## 2019-03-04 ENCOUNTER — Inpatient Hospital Stay (HOSPITAL_COMMUNITY)
Admission: EM | Admit: 2019-03-04 | Discharge: 2019-03-06 | DRG: 064 | Disposition: A | Discharge status: Rehabilitation Facility | Payer: Medicaid Other | Attending: Internal Medicine | Admitting: Internal Medicine

## 2019-03-04 ENCOUNTER — Encounter (HOSPITAL_COMMUNITY): Payer: Self-pay | Admitting: Emergency Medicine

## 2019-03-04 ENCOUNTER — Other Ambulatory Visit: Payer: Self-pay

## 2019-03-04 DIAGNOSIS — I255 Ischemic cardiomyopathy: Secondary | ICD-10-CM | POA: Diagnosis present

## 2019-03-04 DIAGNOSIS — F17200 Nicotine dependence, unspecified, uncomplicated: Secondary | ICD-10-CM | POA: Diagnosis present

## 2019-03-04 DIAGNOSIS — I2510 Atherosclerotic heart disease of native coronary artery without angina pectoris: Secondary | ICD-10-CM | POA: Diagnosis present

## 2019-03-04 DIAGNOSIS — Z20828 Contact with and (suspected) exposure to other viral communicable diseases: Secondary | ICD-10-CM | POA: Diagnosis present

## 2019-03-04 DIAGNOSIS — G936 Cerebral edema: Secondary | ICD-10-CM | POA: Diagnosis present

## 2019-03-04 DIAGNOSIS — F1410 Cocaine abuse, uncomplicated: Secondary | ICD-10-CM | POA: Diagnosis present

## 2019-03-04 DIAGNOSIS — I5042 Chronic combined systolic (congestive) and diastolic (congestive) heart failure: Secondary | ICD-10-CM | POA: Diagnosis not present

## 2019-03-04 DIAGNOSIS — R1909 Other intra-abdominal and pelvic swelling, mass and lump: Secondary | ICD-10-CM | POA: Diagnosis not present

## 2019-03-04 DIAGNOSIS — Z791 Long term (current) use of non-steroidal anti-inflammatories (NSAID): Secondary | ICD-10-CM

## 2019-03-04 DIAGNOSIS — D62 Acute posthemorrhagic anemia: Secondary | ICD-10-CM | POA: Diagnosis not present

## 2019-03-04 DIAGNOSIS — F17210 Nicotine dependence, cigarettes, uncomplicated: Secondary | ICD-10-CM | POA: Diagnosis present

## 2019-03-04 DIAGNOSIS — K5901 Slow transit constipation: Secondary | ICD-10-CM | POA: Diagnosis not present

## 2019-03-04 DIAGNOSIS — I69354 Hemiplegia and hemiparesis following cerebral infarction affecting left non-dominant side: Secondary | ICD-10-CM | POA: Diagnosis not present

## 2019-03-04 DIAGNOSIS — I6389 Other cerebral infarction: Secondary | ICD-10-CM | POA: Diagnosis not present

## 2019-03-04 DIAGNOSIS — R29705 NIHSS score 5: Secondary | ICD-10-CM | POA: Diagnosis present

## 2019-03-04 DIAGNOSIS — I639 Cerebral infarction, unspecified: Secondary | ICD-10-CM | POA: Diagnosis not present

## 2019-03-04 DIAGNOSIS — M7062 Trochanteric bursitis, left hip: Secondary | ICD-10-CM | POA: Diagnosis not present

## 2019-03-04 DIAGNOSIS — E871 Hypo-osmolality and hyponatremia: Secondary | ICD-10-CM | POA: Diagnosis not present

## 2019-03-04 DIAGNOSIS — I10 Essential (primary) hypertension: Secondary | ICD-10-CM | POA: Diagnosis present

## 2019-03-04 DIAGNOSIS — I25810 Atherosclerosis of coronary artery bypass graft(s) without angina pectoris: Secondary | ICD-10-CM | POA: Diagnosis present

## 2019-03-04 DIAGNOSIS — Z955 Presence of coronary angioplasty implant and graft: Secondary | ICD-10-CM

## 2019-03-04 DIAGNOSIS — G8194 Hemiplegia, unspecified affecting left nondominant side: Secondary | ICD-10-CM | POA: Diagnosis not present

## 2019-03-04 DIAGNOSIS — R531 Weakness: Secondary | ICD-10-CM | POA: Diagnosis not present

## 2019-03-04 DIAGNOSIS — M79605 Pain in left leg: Secondary | ICD-10-CM | POA: Diagnosis not present

## 2019-03-04 DIAGNOSIS — Z951 Presence of aortocoronary bypass graft: Secondary | ICD-10-CM

## 2019-03-04 DIAGNOSIS — D179 Benign lipomatous neoplasm, unspecified: Secondary | ICD-10-CM | POA: Diagnosis not present

## 2019-03-04 DIAGNOSIS — I63511 Cerebral infarction due to unspecified occlusion or stenosis of right middle cerebral artery: Secondary | ICD-10-CM | POA: Diagnosis present

## 2019-03-04 LAB — DIFFERENTIAL
Abs Immature Granulocytes: 0.02 10*3/uL (ref 0.00–0.07)
Basophils Absolute: 0 10*3/uL (ref 0.0–0.1)
Basophils Relative: 0 %
Eosinophils Absolute: 0 10*3/uL (ref 0.0–0.5)
Eosinophils Relative: 1 %
Immature Granulocytes: 0 %
Lymphocytes Relative: 9 %
Lymphs Abs: 0.7 10*3/uL (ref 0.7–4.0)
Monocytes Absolute: 0.5 10*3/uL (ref 0.1–1.0)
Monocytes Relative: 7 %
Neutro Abs: 5.9 10*3/uL (ref 1.7–7.7)
Neutrophils Relative %: 83 %

## 2019-03-04 LAB — CBC
HCT: 36.6 % — ABNORMAL LOW (ref 39.0–52.0)
HCT: 36.5 % — ABNORMAL LOW (ref 39.0–52.0)
Hemoglobin: 11.8 g/dL — ABNORMAL LOW (ref 13.0–17.0)
Hemoglobin: 11.9 g/dL — ABNORMAL LOW (ref 13.0–17.0)
MCH: 29.8 pg (ref 26.0–34.0)
MCH: 29.8 pg (ref 26.0–34.0)
MCHC: 32.2 g/dL (ref 30.0–36.0)
MCHC: 32.6 g/dL (ref 30.0–36.0)
MCV: 92.4 fL (ref 80.0–100.0)
MCV: 91.3 fL (ref 80.0–100.0)
Platelets: 490 10*3/uL — ABNORMAL HIGH (ref 150–400)
Platelets: 434 10*3/uL — ABNORMAL HIGH (ref 150–400)
RBC: 3.96 MIL/uL — ABNORMAL LOW (ref 4.22–5.81)
RBC: 4 MIL/uL — ABNORMAL LOW (ref 4.22–5.81)
RDW: 13.8 % (ref 11.5–15.5)
RDW: 13.6 % (ref 11.5–15.5)
WBC: 6.4 10*3/uL (ref 4.0–10.5)
WBC: 7.1 10*3/uL (ref 4.0–10.5)
nRBC: 0 % (ref 0.0–0.2)
nRBC: 0 % (ref 0.0–0.2)

## 2019-03-04 LAB — URINALYSIS, ROUTINE W REFLEX MICROSCOPIC
Bacteria, UA: NONE SEEN
Bilirubin Urine: NEGATIVE
Glucose, UA: NEGATIVE mg/dL
Hgb urine dipstick: NEGATIVE
Ketones, ur: NEGATIVE mg/dL
Leukocytes,Ua: NEGATIVE
Nitrite: NEGATIVE
Protein, ur: 30 mg/dL — AB
Specific Gravity, Urine: 1.027 (ref 1.005–1.030)
pH: 6 (ref 5.0–8.0)

## 2019-03-04 LAB — BASIC METABOLIC PANEL
Anion gap: 12 (ref 5–15)
BUN: 14 mg/dL (ref 8–23)
CO2: 25 mmol/L (ref 22–32)
Calcium: 8.7 mg/dL — ABNORMAL LOW (ref 8.9–10.3)
Chloride: 99 mmol/L (ref 98–111)
Creatinine, Ser: 0.97 mg/dL (ref 0.61–1.24)
GFR calc Af Amer: >60 mL/min (ref >60)
GFR calc non Af Amer: >60 mL/min (ref >60)
Glucose, Bld: 90 mg/dL (ref 70–99)
Potassium: 4 mmol/L (ref 3.5–5.1)
Sodium: 136 mmol/L (ref 135–145)

## 2019-03-04 LAB — I-STAT CHEM 8, ED
BUN: 14 mg/dL (ref 8–23)
Calcium, Ion: 1.14 mmol/L — ABNORMAL LOW (ref 1.15–1.40)
Chloride: 101 mmol/L (ref 98–111)
Creatinine, Ser: 0.9 mg/dL (ref 0.61–1.24)
Glucose, Bld: 119 mg/dL — ABNORMAL HIGH (ref 70–99)
HCT: 37 % — ABNORMAL LOW (ref 39.0–52.0)
Hemoglobin: 12.6 g/dL — ABNORMAL LOW (ref 13.0–17.0)
Potassium: 4 mmol/L (ref 3.5–5.1)
Sodium: 137 mmol/L (ref 135–145)
TCO2: 26 mmol/L (ref 22–32)

## 2019-03-04 LAB — HEPATIC FUNCTION PANEL
ALT: 10 U/L (ref 0–44)
AST: 13 U/L — ABNORMAL LOW (ref 15–41)
Albumin: 2.8 g/dL — ABNORMAL LOW (ref 3.5–5.0)
Alkaline Phosphatase: 62 U/L (ref 38–126)
Bilirubin, Direct: <0.1 mg/dL (ref 0.0–0.2)
Total Bilirubin: 0.5 mg/dL (ref 0.3–1.2)
Total Protein: 7.2 g/dL (ref 6.5–8.1)

## 2019-03-04 LAB — MAGNESIUM: Magnesium: 2.3 mg/dL (ref 1.7–2.4)

## 2019-03-04 LAB — APTT: aPTT: 34 seconds (ref 24–36)

## 2019-03-04 LAB — CBG MONITORING, ED: Glucose-Capillary: 77 mg/dL (ref 70–99)

## 2019-03-04 LAB — PROTIME-INR
INR: 1.1 (ref 0.8–1.2)
Prothrombin Time: 13.9 seconds (ref 11.4–15.2)

## 2019-03-04 LAB — ETHANOL: Alcohol, Ethyl (B): <10 mg/dL (ref <10)

## 2019-03-04 LAB — LIPASE, BLOOD: Lipase: 20 U/L (ref 11–51)

## 2019-03-04 MED ORDER — ASPIRIN EC 325 MG PO TBEC
325.0000 mg | DELAYED_RELEASE_TABLET | Freq: Every day | ORAL | Status: DC
  Administered 2019-03-04 – 2019-03-05 (×2): 325 mg via ORAL
  Filled 2019-03-04 (×2): qty 1

## 2019-03-04 MED ORDER — ASPIRIN 300 MG RE SUPP: 300.0000 mg | Freq: Every day | RECTAL | Status: DC

## 2019-03-04 NOTE — ED Notes (Signed)
Pt refusing IV acces, Covid Swab, Vital signs and repeat NIH scale

## 2019-03-04 NOTE — ED Notes (Signed)
Re-entered room to attempt explanation regarding food for patient, pt again raises voice and speaks over this RN. Pt stated "I ain't gotta listen to shit you say, it ain't got shit to do with my stomach". Pt calling out on call bell continuously to request food, explanation attempted multiple times, pt continues to speak over this RN.

## 2019-03-04 NOTE — H&P (Signed)
History and Physical   [name] KXF:[id number] DOB: 08-29-1955 DOA: 03/04/2019  Referring MD/NP/PA: Dr Vanita Panda  PCP: [facility], [facility]   Outpatient Specialists: None  Patient coming from: Home  Chief Complaint: Left-sided weakness  HPI: [name] is a 63 y.o. male with medical history significant of polysubstance abuse including cocaine, coronary artery disease with stents as well as coronary artery bypass graft back in 2010, ischemic cardiomyopathy with EF of 30 to 35%, tobacco abuse and hypertension who apparently sustained left-sided weakness in the last few days.  For 2 days he has been feeling weak and dragging his left leg.  Patient is a poor historian.  He has been weak and debilitated and today was unable to move around so he called EMS that brought him to the ER.  Stroke suspected but appears to be at least 45 days old.  Neurology has seen the patient and evaluated him.  MRI done shows a right-sided CVA in the watershed distribution.  Patient is therefore being admitted to the hospital for full stroke work-up.  He is improving now.  Almost normal function now on the left side.  Denied any fever or chills no nausea vomiting or diarrhea..  ED Course: Temperature 98.3 blood pressure 107/65 pulse 87 respirate of 6 oxygen sat 95% room air.  White count 7.1 hemoglobin 12.6 and platelets 434.  Calcium 8.7 the rest of the chemistry appears to be within normal.  Urinalysis is negative.  Alcohol level is less than 10.  MRI of the brain showed right-sided infarcts probably embolic versus watershed.  Appears to be at least 60 days old.  Patient will be admitted for further work-up  Review of Systems: As per HPI otherwise 10 point review of systems negative.    Past Medical History:  Diagnosis Date  . CAD (coronary artery disease)    a. 04/2008 Cath: LM nl, LAD 50p, 27m, LCX min irregs, RCA 50p/m, 40d, RPDA 60-70ost; b. 04/2008 CABG x 4: LIMA->LAD, VG->D2,  VG->RPDA->RPL.  . Cocaine abuse ([place])    a. Quit early 2019.  . Ischemic cardiomyopathy    a. LV gram: EF 30-35%, apical/periapical AK.  . Tobacco abuse     Past Surgical History:  Procedure Laterality Date  . BYPASS GRAFT    . CARDIAC SURGERY       reports that he has been smoking cigarettes. He has been smoking about 0.30 packs per day. He uses smokeless tobacco. He reports that he does not drink alcohol or use drugs.  No Known Allergies  No family history on file.   Prior to Admission medications   Medication Sig Start Date End Date Taking? Authorizing Provider  aspirin EC 81 MG EC tablet Take 1 tablet (81 mg total) by mouth daily. Patient not taking: Reported on 03/04/2019 10/18/17   Molli Hazard A, DO  atorvastatin (LIPITOR) 40 MG tablet Take 1 tablet (40 mg total) by mouth daily at 6 PM. Patient not taking: Reported on 03/04/2019 10/19/17 10/17/18  Jerline Pain, MD  ibuprofen (ADVIL,MOTRIN) 400 MG tablet Take 1 tablet (400 mg total) by mouth every 6 (six) hours as needed for pain. Patient not taking: Reported on 03/04/2019 12/29/12   Carlisle Cater, PA-C  lisinopril (ZESTRIL) 2.5 MG tablet Take 1 tablet (2.5 mg total) by mouth daily. Patient not taking: Reported on 03/04/2019 10/17/18 01/15/19  Isaiah Serge, NP    Physical Exam: Vitals:   03/04/19 1446 03/04/19 1518 03/04/19 2105 03/04/19 2106  BP: 107/65 132/76 118/77   Pulse: 87 87 81 81  Resp: 14 16 16    Temp:      TempSrc:      SpO2: 98% 96% 96% 95%  Height:          Constitutional: Chronically ill looking, cachectic, no acute distress Vitals:   03/04/19 1446 03/04/19 1518 03/04/19 2105 03/04/19 2106  BP: 107/65 132/76 118/77   Pulse: 87 87 81 81  Resp: 14 16 16    Temp:      TempSrc:      SpO2: 98% 96% 96% 95%  Height:       Eyes: PERRL, lids and conjunctivae normal ENMT: Mucous membranes are moist. Posterior pharynx clear of any exudate or lesions.Normal dentition.  Neck: normal, supple, no  masses, no thyromegaly Respiratory: clear to auscultation bilaterally, no wheezing, no crackles. Normal respiratory effort. No accessory muscle use.  Cardiovascular: Regular rate and rhythm, no murmurs / rubs / gallops. No extremity edema. 2+ pedal pulses. No carotid bruits.  Abdomen: no tenderness, no masses palpated. No hepatosplenomegaly. Bowel sounds positive.  Musculoskeletal: no clubbing / cyanosis. No joint deformity upper and lower extremities. Good ROM, no contractures. Normal muscle tone.  Skin: no rashes, lesions, ulcers. No induration Neurologic: CN 2-12 grossly intact. Sensation intact, DTR normal. Strength 5/5 in right upper lower extremity, 4 out of 5 in left upper and lower extremity respectively Psychiatric: Normal judgment and insight. Alert and oriented x 3. Normal mood.     Labs on Admission: I have personally reviewed following labs and imaging studies  CBC: Recent Labs  Lab 03/04/19 1218 03/04/19 1531 03/04/19 1558  WBC 6.4 7.1  --   NEUTROABS  --  5.9  --   HGB 11.8* 11.9* 12.6*  HCT 36.6* 36.5* 37.0*  MCV 92.4 91.3  --   PLT 490* 434*  --    Basic Metabolic Panel: Recent Labs  Lab 03/04/19 1218 03/04/19 1531 03/04/19 1558  NA 136  --  137  K 4.0  --  4.0  CL 99  --  101  CO2 25  --   --   GLUCOSE 90  --  119*  BUN 14  --  14  CREATININE 0.97  --  0.90  CALCIUM 8.7*  --   --   MG  --  2.3  --    GFR: CrCl cannot be calculated (Unknown ideal weight.). Liver Function Tests: Recent Labs  Lab 03/04/19 1531  AST 13*  ALT 10  ALKPHOS 62  BILITOT 0.5  PROT 7.2  ALBUMIN 2.8*   Recent Labs  Lab 03/04/19 1531  LIPASE 20   No results for input(s): AMMONIA in the last 168 hours. Coagulation Profile: Recent Labs  Lab 03/04/19 1531  INR 1.1   Cardiac Enzymes: No results for input(s): CKTOTAL, CKMB, CKMBINDEX, TROPONINI in the last 168 hours. BNP (last 3 results) No results for input(s): PROBNP in the last 8760 hours. HbA1C: No results  for input(s): HGBA1C in the last 72 hours. CBG: Recent Labs  Lab 03/04/19 1216  GLUCAP 77   Lipid Profile: No results for input(s): CHOL, HDL, LDLCALC, TRIG, CHOLHDL, LDLDIRECT in the last 72 hours. Thyroid Function Tests: No results for input(s): TSH, T4TOTAL, FREET4, T3FREE, THYROIDAB in the last 72 hours. Anemia Panel: No results for input(s): VITAMINB12, FOLATE, FERRITIN, TIBC, IRON, RETICCTPCT in the last 72 hours. Urine analysis:    Component Value Date/Time   COLORURINE YELLOW 03/04/2019 1658   APPEARANCEUR  CLEAR 03/04/2019 1658   LABSPEC 1.027 03/04/2019 1658   PHURINE 6.0 03/04/2019 [place] 03/04/2019 1658   HGBUR NEGATIVE 03/04/2019 [place] 03/04/2019 [place] 03/04/2019 1658   PROTEINUR 30 (A) 03/04/2019 1658   UROBILINOGEN 1.0 04/30/2008 0423   NITRITE NEGATIVE 03/04/2019 1658   LEUKOCYTESUR NEGATIVE 03/04/2019 1658   Sepsis Labs: @LABRCNTIP (procalcitonin:4,lacticidven:4) )No results found for this or any previous visit (from the past 240 hour(s)).   Radiological Exams on Admission: Dg Chest Port 1 View  Result Date: 03/04/2019 CLINICAL DATA:  Weakness, fall. EXAM: PORTABLE CHEST 1 VIEW COMPARISON:  April 18, 2017. FINDINGS: The heart size and mediastinal contours are within normal limits. Status post coronary bypass graft. No pneumothorax or pleural effusion is noted. Left lung is clear. Large rounded density is noted in right lower lobe concerning for possible neoplasm. The visualized skeletal structures are unremarkable. IMPRESSION: Large rounded density seen in right lower lobe concerning for possible neoplasm. CT scan of the chest is recommended for further evaluation. Electronically Signed   By: Marijo Conception M.D.   On: 03/04/2019 15:49   Dg Hip Unilat With Pelvis 2-3 Views Left  Result Date: 03/04/2019 CLINICAL DATA:  [place] today with left hip pain. EXAM: DG HIP (WITH OR WITHOUT PELVIS) 2-3V LEFT  COMPARISON:  None. FINDINGS: No evidence of acute pelvic fracture. No evidence of left hip fracture. Old healed right femoral neck fracture IMPRESSION: No acute or traumatic finding. Electronically Signed   By: Nelson Chimes M.D.   On: 03/04/2019 13:34    EKG: Independently reviewed.  It shows normal sinus rhythm with inverted T waves in the lateral leads appears to be unchanged from previous  Assessment/Plan Principal Problem:   Acute cerebrovascular accident (CVA) ([place]) Active Problems:   TOBACCO ABUSE   Cocaine abuse ([place])   CAD, ARTERY BYPASS GRAFT     #1 acute to subacute CVA: Patient has mild left-sided weakness at the moment.  We will admit the patient.  Get echocardiogram, hemoglobin A1c, carotid Doppler, initiate aspirin, LFTs and lipid panel.  Follow stroke team's recommendations.  #2 history of cocaine abuse: Check urine drug screen.  Counseling provided  #3 tobacco abuse: Counseling provided.  Add nicotine patch  #4 coronary artery disease: Appears stable.  Admit to telemetry bed.  #5 ischemic cardiomyopathy: Previous echo showed EF of 30 to 35%.  Will repeat an echocardiogram today.   DVT prophylaxis: Lovenox Code Status: Full code Family Communication: No family at bedside Disposition Plan: Home Consults called: None Admission status: Inpatient  Severity of Illness: The appropriate patient status for this patient is INPATIENT. Inpatient status is judged to be reasonable and necessary in order to provide the required intensity of service to ensure the patient's safety. The patient's presenting symptoms, physical exam findings, and initial radiographic and laboratory data in the context of their chronic comorbidities is felt to place them at high risk for further clinical deterioration. Furthermore, it is not anticipated that the patient will be medically stable for discharge from the hospital within 2 midnights of admission. The following factors support the patient  status of inpatient.   " The patient's presenting symptoms include left-sided weakness. " The worrisome physical exam findings include mild left-sided weakness. " The initial radiographic and laboratory data are worrisome because of MRI showing acute to subacute CVA. " The chronic co-morbidities include polysubstance abuse.   * I certify that at the point of admission it  is my clinical judgment that the patient will require inpatient hospital care spanning beyond 2 midnights from the point of admission due to high intensity of service, high risk for further deterioration and high frequency of surveillance required.Barbette Merino MD [facility] Pager [phone]  If 7PM-7AM, please contact night-coverage [email or web address] Password [facility]  03/04/2019, 11:52 PM

## 2019-03-04 NOTE — ED Notes (Signed)
Entered room to explain to pt the process of admission. Explained pt required IV access and that he needed to leave his monitoring on so we could watch him. Pt demanding something to eat, MD Leonel Ramsay stated was OK pending stroke swallow screen. Attempted pt process of stroke swallow screen and he continued to yell over this RN, I encouraged pt to listen so I could explain process of getting food to him and pt said "Y'all ain't gonna tell me shit, give me food". Attempted once again to explain to pt how he could get food, and he again shouted over my explanation. Attempted IV access to pt and he stated that I wasn't going to stick him unless I gave him food, attempted for 3rd time to explain necessity of stroke swallow screen and IV access, pt again shouted over this RN for a third time. Exited room as pt was escalating and becoming agitated.

## 2019-03-04 NOTE — Consult Note (Signed)
Neurology Consultation Reason for Consult: Stroke Referring Physician: Vanita Panda, R  CC: Left-sided weakness  History is obtained from: Patient  HPI: [name] is a 62 y.o. male who has been at home with left-sided weakness for at least a couple of days, though he is unable to give me an exact time.  He has been able, however, did get around at home until today.  Today he was no longer able to get around and therefore when his landlord found him, he asked EMS to bring him to the emergency department.  He endorses left-sided numbness and weakness, though I am not sure he is aware of the degree.   LKW: At least 2 days ago tpa given?: no, outside of window    ROS: A 14 point ROS was performed and is negative except as noted in the HPI.   Past Medical History:  Diagnosis Date  . CAD (coronary artery disease)    a. 04/2008 Cath: LM nl, LAD 50p, 19m, LCX min irregs, RCA 50p/m, 40d, RPDA 60-70ost; b. 04/2008 CABG x 4: LIMA->LAD, VG->D2, VG->RPDA->RPL.  . Cocaine abuse ([place])    a. Quit early 2019.  . Ischemic cardiomyopathy    a. LV gram: EF 30-35%, apical/periapical AK.  . Tobacco abuse      No family history on file.   Social History:  reports that he has been smoking cigarettes. He has been smoking about 0.30 packs per day. He uses smokeless tobacco. He reports that he does not drink alcohol or use drugs.   Exam: Current vital signs: BP 118/77 (BP Location: Right Arm)   Pulse 81   Temp 98.3 F (36.8 C) (Oral)   Resp 16   Ht 5\' 7"  (1.702 m)   SpO2 95%   BMI 18.66 kg/m  Vital signs in last 24 hours: Temp:  [98.3 F (36.8 C)] 98.3 F (36.8 C) (12/08 1206) Pulse Rate:  [81-87] 81 (12/08 2106) Resp:  [14-16] 16 (12/08 2105) BP: (107-132)/(65-78) 118/77 (12/08 2105) SpO2:  [95 %-98 %] 95 % (12/08 2106)   Physical Exam  Constitutional: Appears well-developed and well-nourished.  Psych: Affect appropriate to situation Eyes: No scleral injection HENT: No OP  obstrucion MSK: no joint deformities.  Cardiovascular: Normal rate and regular rhythm.  Respiratory: Effort normal, non-labored breathing GI: Soft.  No distension. There is no tenderness.  Skin: WDI  Neuro: Mental Status: Patient is awake, alert, oriented to person, place, month, gives year as 2012 Patient is a poor historian No signs of aphasia  He does not appear to attend to the left as well as the right, but does not extinguish Cranial Nerves: II: Visual Fields are full, and though he does not extinguish to double simultaneous stimulation, I do get the sense that he has some visual neglect. Pupils are equal, round, and reactive to light.   III,IV, VI: Though he has a very mild right gaze preference, he does cross midline readily to the left V: Facial sensation is diminished on the left VII: Facial movement is diminished on the left Motor: He has 4/5 weakness of the left arm and leg, though I think he has a mild motor neglect given that initially he gives much poorer effort, but then when physically prompted performs much better Sensory: Sensation is diminished on the left Cerebellar: No clear ataxia on the right, consistent with weakness on the left   I have reviewed labs in epic and the results pertinent to this consultation are: Chem 8 -  unremarkable other than borderline low HgB  I have reviewed the images obtained: MRI brain - Right sided infarcts in either watershed or embolic distribution.   Impression: 63 yo M with two day old infarct in the right hemisphere. I suspect that this is likely due either to embolus that shattered vs more proximal large vessel atherosclerotic disease. He is outside the window for any type of acute intervention but will need to be admitted for therapy and secondary risk factor modificaiton.   Recommendations: - HgbA1c, fasting lipid panel - Frequent neuro checks - Echocardiogram - CTA head and neck - Prophylactic therapy-Antiplatelet med:  Aspirin - dose 325mg  PO or 300mg  PR - Risk factor modification - Telemetry monitoring - PT consult, OT consult, Speech consult - Stroke team to follow    Roland Rack, MD [facility] [phone]  If 7pm- 7am, please page neurology on call as listed in [employer].

## 2019-03-04 NOTE — ED Notes (Signed)
MD Kirkpatrick at bedside.

## 2019-03-04 NOTE — ED Provider Notes (Signed)
[facility] EMERGENCY DEPARTMENT Provider Note   CSN: [id number] Arrival date & time: 03/04/19  1157     History   Chief Complaint Chief Complaint  Patient presents with  . Weakness  . Fall    HPI PADEN SENGER is a 63 y.o. male.     HPI Patient presents with concern of new lower extremity weakness and falls. Patient does not offer details about his medical history in forthcoming fashion, states the does not have medical problems, denies alcohol use, states that he smokes cigarettes.  He states that he was in his usual state of health until 3 or 4 days ago when he started developing difficulty with ambulation, bilateral lower extremity numbness and weakness. Over the interval days he has had several falls, while attempting to ambulate, and today had a more memorable one than those prior. He notes that he fell, striking his left hip, has had severe, sore pain in the left hip, worse with motion or attempted weightbearing since the event. He denies head trauma, loss of consciousness, has had no headache, chest pain, abdominal pain, confusion, disorientation, vision changes throughout.  Past Medical History:  Diagnosis Date  . CAD (coronary artery disease)    a. 04/2008 Cath: LM nl, LAD 50p, 71m, LCX min irregs, RCA 50p/m, 40d, RPDA 60-70ost; b. 04/2008 CABG x 4: LIMA->LAD, VG->D2, VG->RPDA->RPL.  . Cocaine abuse ([place])    a. Quit early 2019.  . Ischemic cardiomyopathy    a. LV gram: EF 30-35%, apical/periapical AK.  . Tobacco abuse     Patient Active Problem List   Diagnosis Date Noted  . Chest pain 10/16/2017  . HYPERLIPIDEMIA-MIXED 06/16/2008  . TOBACCO ABUSE 06/16/2008  . MARIJUANA ABUSE 06/16/2008  . COCAINE ABUSE 06/16/2008  . CAD, ARTERY BYPASS GRAFT 04/30/2008    Past Surgical History:  Procedure Laterality Date  . BYPASS GRAFT    . CARDIAC SURGERY          Home Medications    Prior to Admission medications   Medication Sig Start Date  End Date Taking? Authorizing Provider  aspirin EC 81 MG EC tablet Take 1 tablet (81 mg total) by mouth daily. Patient not taking: Reported on 03/04/2019 10/18/17   Molli Hazard A, DO  atorvastatin (LIPITOR) 40 MG tablet Take 1 tablet (40 mg total) by mouth daily at 6 PM. Patient not taking: Reported on 03/04/2019 10/19/17 10/17/18  Jerline Pain, MD  ibuprofen (ADVIL,MOTRIN) 400 MG tablet Take 1 tablet (400 mg total) by mouth every 6 (six) hours as needed for pain. Patient not taking: Reported on 03/04/2019 12/29/12   Carlisle Cater, PA-C  lisinopril (ZESTRIL) 2.5 MG tablet Take 1 tablet (2.5 mg total) by mouth daily. Patient not taking: Reported on 03/04/2019 10/17/18 01/15/19  Isaiah Serge, NP    Family History No family history on file.  Social History Social History   Tobacco Use  . Smoking status: Current Every Day Smoker    Packs/day: 0.30    Types: Cigarettes  . Smokeless tobacco: Current User  . Tobacco comment: has smoked for 40+ yrs - at most 1ppd, currently 1ppwk.  Substance Use Topics  . Alcohol use: No    Comment: quite > 30 yrs ago.  . Drug use: No    Comment: prev used cocaine - quit early 2019.     Allergies   Patient has no known allergies.   Review of Systems Review of Systems  Constitutional:  Per HPI, otherwise negative  HENT:       Per HPI, otherwise negative  Respiratory:       Per HPI, otherwise negative  Cardiovascular:       Per HPI, otherwise negative  Gastrointestinal: Negative for vomiting.  Endocrine:       Negative aside from HPI  Genitourinary:       Neg aside from HPI   Musculoskeletal:       Per HPI, otherwise negative  Skin: Negative.   Neurological: Positive for numbness. Negative for syncope, speech difficulty and headaches.     Physical Exam Updated Vital Signs BP 118/77 (BP Location: Right Arm)   Pulse 81   Temp 98.3 F (36.8 C) (Oral)   Resp 16   Ht 5\' 7"  (1.702 m)   SpO2 95%   BMI 18.66 kg/m   Physical  Exam Vitals signs and nursing note reviewed.  Constitutional:      General: He is not in acute distress.    Appearance: He is well-developed.  HENT:     Head: Normocephalic and atraumatic.  Eyes:     Conjunctiva/sclera: Conjunctivae normal.  Cardiovascular:     Rate and Rhythm: Normal rate and regular rhythm.  Pulmonary:     Effort: Pulmonary effort is normal. No respiratory distress.     Breath sounds: No stridor.  Abdominal:     General: There is no distension.  Skin:    General: Skin is warm and dry.  Neurological:     Mental Status: He is alert and oriented to person, place, and time.     Comments: Speech is clear, brief, appropriate, no gross facial asymmetry.  Patient has positive pronator drift, left arm, has symmetric 4/5 strength in both lower extremities, with preserved sensation, though he describes numbness.   Psychiatric:        Behavior: Behavior is withdrawn.      ED Treatments / Results  Labs (all labs ordered are listed, but only abnormal results are displayed) Labs Reviewed  BASIC METABOLIC PANEL - Abnormal; Notable for the following components:      Result Value   Calcium 8.7 (*)    All other components within normal limits  CBC - Abnormal; Notable for the following components:   RBC 3.96 (*)    Hemoglobin 11.8 (*)    HCT 36.6 (*)    Platelets 490 (*)    All other components within normal limits  URINALYSIS, ROUTINE W REFLEX MICROSCOPIC - Abnormal; Notable for the following components:   Protein, ur 30 (*)    All other components within normal limits  HEPATIC FUNCTION PANEL - Abnormal; Notable for the following components:   Albumin 2.8 (*)    AST 13 (*)    All other components within normal limits  CBC - Abnormal; Notable for the following components:   RBC 4.00 (*)    Hemoglobin 11.9 (*)    HCT 36.5 (*)    Platelets 434 (*)    All other components within normal limits  I-STAT CHEM 8, ED - Abnormal; Notable for the following components:    Glucose, Bld 119 (*)    Calcium, Ion 1.14 (*)    Hemoglobin 12.6 (*)    HCT 37.0 (*)    All other components within normal limits  SARS CORONAVIRUS 2 (TAT 6-24 HRS)  ETHANOL  PROTIME-INR  APTT  DIFFERENTIAL  LIPASE, BLOOD  MAGNESIUM  RAPID URINE DRUG SCREEN, HOSP PERFORMED  CBG MONITORING, ED  EKG EKG Interpretation  Date/Time:  Tuesday March 04 2019 12:10:31 EST Ventricular Rate:  84 PR Interval:  136 QRS Duration: 80 QT Interval:  364 QTC Calculation: 430 R Axis:   76 Text Interpretation: Normal sinus rhythm Anterolateral infarct , age undetermined Abnormal ECG Confirmed by Madalyn Rob ([id number]) on 03/04/2019 12:20:03 PM   Radiology Dg Chest Port 1 View  Result Date: 03/04/2019 CLINICAL DATA:  Weakness, fall. EXAM: PORTABLE CHEST 1 VIEW COMPARISON:  April 18, 2017. FINDINGS: The heart size and mediastinal contours are within normal limits. Status post coronary bypass graft. No pneumothorax or pleural effusion is noted. Left lung is clear. Large rounded density is noted in right lower lobe concerning for possible neoplasm. The visualized skeletal structures are unremarkable. IMPRESSION: Large rounded density seen in right lower lobe concerning for possible neoplasm. CT scan of the chest is recommended for further evaluation. Electronically Signed   By: Marijo Conception M.D.   On: 03/04/2019 15:49   Dg Hip Unilat With Pelvis 2-3 Views Left  Result Date: 03/04/2019 CLINICAL DATA:  [place] today with left hip pain. EXAM: DG HIP (WITH OR WITHOUT PELVIS) 2-3V LEFT COMPARISON:  None. FINDINGS: No evidence of acute pelvic fracture. No evidence of left hip fracture. Old healed right femoral neck fracture IMPRESSION: No acute or traumatic finding. Electronically Signed   By: Nelson Chimes M.D.   On: 03/04/2019 13:34   MRI FINDINGS: Brain: There is multifocal acute ischemia within the right hemisphere, scattered within the right MCA territory with the largest lesions in the right  frontal lobe and anterior right temporal lobe. No acute hemorrhage. No midline shift or other mass effect. Brain volume is normal. No hydrocephalus. There is mild cytotoxic edema associated with the ischemic sites but the brain parenchyma is otherwise normal. No chronic microhemorrhage. The midline structures are normal.  Vascular: Flow voids are preserved.  Skull and upper cervical spine: Normal bone marrow signal. Visualized extracranial soft tissues are normal.  Sinuses/Orbits: Orbits are normal. No mastoid or middle ear effusion. Paranasal sinuses are clear.  Other: None  IMPRESSION: Multifocal acute ischemia within the right MCA territory. No hemorrhage or mass effect.  Medications Ordered in ED Medications - No data to display   Initial Impression / Assessment and Plan / ED Course  I have reviewed the triage vital signs and the nursing notes.  Pertinent labs & imaging results that were available during my care of the patient were reviewed by me and considered in my medical decision making (see chart for details).        9:49 PM Patient in no distress, remains generally ill, though not acutely so. I discussed the patient's MRI results with him, and previously spoke with our neurology colleague, Dr. Leonel Ramsay. Patient's MRI consistent with multifocal infarct, and given the patient's description of weakness a few days ago, worse today, and her suspicion for ongoing process. Patient will have CT angiography, head, neck, will require admission for further monitoring, management of his acute stroke.  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

## 2019-03-04 NOTE — ED Triage Notes (Signed)
Pt arrives via EMS from home with reports of a fall today and endorses weakness to bilateral legs. Pt states his land lord found him and called EMS cause he couldn't stand up. Endorses left hip pain.

## 2019-03-04 NOTE — ED Notes (Signed)
ED TO INPATIENT HANDOFF REPORT  ED Nurse  Phone #:  [phone]  S Name/Age/Gender [name] 63 y.o. male Room/Bed: RESUSC/RESUSC  Code Status   Code Status: Prior  Home/SNF/Other Home Patient oriented x4   Triage Complete: Triage complete  Chief Complaint Weakness  Triage Note Pt arrives via EMS from home with reports of a fall today and endorses weakness to bilateral legs. Pt states his land lord found him and called EMS cause he couldn't stand up. Endorses left hip pain.    Allergies No Known Allergies  Level of Care/Admitting Diagnosis ED Disposition    ED Disposition Condition [facility]: [facility] [[id number]]  Level of Care: Telemetry Medical [104]  Covid Evaluation: Asymptomatic Screening Protocol (No Symptoms)  Diagnosis: Acute cerebrovascular accident (CVA) [facility]) [[id number]]  Admitting Physician: Elwyn Reach [[id number]]  Attending Physician: Elwyn Reach [[id number]]  Estimated length of stay: past midnight tomorrow  Certification:: I certify this patient will need inpatient services for at least 2 midnights  PT Class (Do Not Modify): Inpatient [101]  PT Acc Code (Do Not Modify): Private [1]       B Medical/Surgery History Past Medical History:  Diagnosis Date  . CAD (coronary artery disease)    a. 04/2008 Cath: LM nl, LAD 50p, 1m, LCX min irregs, RCA 50p/m, 40d, RPDA 60-70ost; b. 04/2008 CABG x 4: LIMA->LAD, VG->D2, VG->RPDA->RPL.  . Cocaine abuse ([place])    a. Quit early 2019.  . Ischemic cardiomyopathy    a. LV gram: EF 30-35%, apical/periapical AK.  . Tobacco abuse    Past Surgical History:  Procedure Laterality Date  . BYPASS GRAFT    . CARDIAC SURGERY       A IV Location/Drains/Wounds Patient Lines/Drains/Airways Status   Active Line/Drains/Airways    Name:   Placement date:   Placement time:   Site:   Days:   Peripheral IV 03/04/19 Left Antecubital   03/04/19    2328    Antecubital   less than 1           Intake/Output Last 24 hours No intake or output data in the 24 hours ending 03/04/19 2351  Labs/Imaging Results for orders placed or performed during the hospital encounter of 03/04/19 (from the past 48 hour(s))  CBG monitoring, ED     Status: None   Collection Time: 03/04/19 12:16 PM  Result Value Ref Range   Glucose-Capillary 77 70 - 99 mg/dL  Basic metabolic panel     Status: Abnormal   Collection Time: 03/04/19 12:18 PM  Result Value Ref Range   Sodium 136 135 - 145 mmol/L   Potassium 4.0 3.5 - 5.1 mmol/L   Chloride 99 98 - 111 mmol/L   CO2 25 22 - 32 mmol/L   Glucose, Bld 90 70 - 99 mg/dL   BUN 14 8 - 23 mg/dL   Creatinine, Ser 0.97 0.61 - 1.24 mg/dL   Calcium 8.7 (L) 8.9 - 10.3 mg/dL   GFR calc non Af Amer >60 >60 mL/min   GFR calc Af Amer >60 >60 mL/min   Anion gap 12 5 - 15    Comment: Performed at [facility], [place] [address], [place] [address]  CBC     Status: Abnormal   Collection Time: 03/04/19 12:18 PM  Result Value Ref Range   WBC 6.4 4.0 - 10.5 K/uL   RBC 3.96 (L) 4.22 - 5.81 MIL/uL  Hemoglobin 11.8 (L) 13.0 - 17.0 g/dL   HCT 36.6 (L) 39.0 - 52.0 %   MCV 92.4 80.0 - 100.0 fL   MCH 29.8 26.0 - 34.0 pg   MCHC 32.2 30.0 - 36.0 g/dL   RDW 13.8 11.5 - 15.5 %   Platelets 490 (H) 150 - 400 K/uL   nRBC 0.0 0.0 - 0.2 %    Comment: Performed at [facility] [address] [place], [place] [address]  Protime-INR     Status: None   Collection Time: 03/04/19  3:31 PM  Result Value Ref Range   Prothrombin Time 13.9 11.4 - 15.2 seconds   INR 1.1 0.8 - 1.2    Comment: (NOTE) INR goal varies based on device and disease states. Performed at [facility], [place] [address] [place], [place] [address]   APTT     Status: None   Collection Time: 03/04/19  3:31 PM  Result Value Ref Range   aPTT 34 24 - 36 seconds    Comment: Performed at [facility] [address], [place], [place] [address]  Differential     Status: None    Collection Time: 03/04/19  3:31 PM  Result Value Ref Range   Neutrophils Relative % 83 %   Neutro Abs 5.9 1.7 - 7.7 K/uL   Lymphocytes Relative 9 %   Lymphs Abs 0.7 0.7 - 4.0 K/uL   Monocytes Relative 7 %   Monocytes Absolute 0.5 0.1 - 1.0 K/uL   Eosinophils Relative 1 %   Eosinophils Absolute 0.0 0.0 - 0.5 K/uL   Basophils Relative 0 %   Basophils Absolute 0.0 0.0 - 0.1 K/uL   Immature Granulocytes 0 %   Abs Immature Granulocytes 0.02 0.00 - 0.07 K/uL    Comment: Performed at [facility] [address] [place], [place] [address]  Lipase, blood     Status: None   Collection Time: 03/04/19  3:31 PM  Result Value Ref Range   Lipase 20 11 - 51 U/L    Comment: Performed at [facility] [address], [place], [place] [address]  Hepatic function panel     Status: Abnormal   Collection Time: 03/04/19  3:31 PM  Result Value Ref Range   Total Protein 7.2 6.5 - 8.1 g/dL   Albumin 2.8 (L) 3.5 - 5.0 g/dL   AST 13 (L) 15 - 41 U/L   ALT 10 0 - 44 U/L   Alkaline Phosphatase 62 38 - 126 U/L   Total Bilirubin 0.5 0.3 - 1.2 mg/dL   Bilirubin, Direct <0.1 0.0 - 0.2 mg/dL   Indirect Bilirubin NOT CALCULATED 0.3 - 0.9 mg/dL    Comment: Performed at [facility] [address] [place], [place] [address]  Magnesium     Status: None   Collection Time: 03/04/19  3:31 PM  Result Value Ref Range   Magnesium 2.3 1.7 - 2.4 mg/dL    Comment: Performed at [facility] [address], [place] [address]  CBC     Status: Abnormal   Collection Time: 03/04/19  3:31 PM  Result Value Ref Range   WBC 7.1 4.0 - 10.5 K/uL   RBC 4.00 (L) 4.22 - 5.81 MIL/uL   Hemoglobin 11.9 (L) 13.0 - 17.0 g/dL   HCT 36.5 (L) 39.0 - 52.0 %   MCV 91.3 80.0 - 100.0 fL   MCH 29.8 26.0 - 34.0  pg   MCHC 32.6 30.0 - 36.0 g/dL   RDW 13.6 11.5 - 15.5 %   Platelets 434 (H) 150 - 400 K/uL   nRBC 0.0 0.0 - 0.2 %    Comment: Performed at [facility], [place] [address] [place], [place]  [address]  Ethanol     Status: None   Collection Time: 03/04/19  3:52 PM  Result Value Ref Range   Alcohol, Ethyl (B) <10 <10 mg/dL    Comment: (NOTE) Lowest detectable limit for serum alcohol is 10 mg/dL. For medical purposes only. Performed at [facility], [place] [address], [place], [place] [address]   I-stat chem 8, ED     Status: Abnormal   Collection Time: 03/04/19  3:58 PM  Result Value Ref Range   Sodium 137 135 - 145 mmol/L   Potassium 4.0 3.5 - 5.1 mmol/L   Chloride 101 98 - 111 mmol/L   BUN 14 8 - 23 mg/dL   Creatinine, Ser 0.90 0.61 - 1.24 mg/dL   Glucose, Bld 119 (H) 70 - 99 mg/dL   Calcium, Ion 1.14 (L) 1.15 - 1.40 mmol/L   TCO2 26 22 - 32 mmol/L   Hemoglobin 12.6 (L) 13.0 - 17.0 g/dL   HCT 37.0 (L) 39.0 - 52.0 %  Urinalysis, Routine w reflex microscopic     Status: Abnormal   Collection Time: 03/04/19  4:58 PM  Result Value Ref Range   Color, Urine YELLOW YELLOW   APPearance CLEAR CLEAR   Specific Gravity, Urine 1.027 1.005 - 1.030   pH 6.0 5.0 - 8.0   Glucose, UA NEGATIVE NEGATIVE mg/dL   Hgb urine dipstick NEGATIVE NEGATIVE   Bilirubin Urine NEGATIVE NEGATIVE   Ketones, ur NEGATIVE NEGATIVE mg/dL   Protein, ur 30 (A) NEGATIVE mg/dL   Nitrite NEGATIVE NEGATIVE   Leukocytes,Ua NEGATIVE NEGATIVE   RBC / HPF 0-5 0 - 5 RBC/hpf   WBC, UA 0-5 0 - 5 WBC/hpf   Bacteria, UA NONE SEEN NONE SEEN   Squamous Epithelial / LPF 0-5 0 - 5   Mucus PRESENT     Comment: Performed at [facility], [place] [address] [place], [place] [address]   Dg Chest Port 1 View  Result Date: 03/04/2019 CLINICAL DATA:  Weakness, fall. EXAM: PORTABLE CHEST 1 VIEW COMPARISON:  April 18, 2017. FINDINGS: The heart size and mediastinal contours are within normal limits. Status post coronary bypass graft. No pneumothorax or pleural effusion is noted. Left lung is clear. Large rounded density is noted in right lower lobe concerning for possible neoplasm. The visualized skeletal structures  are unremarkable. IMPRESSION: Large rounded density seen in right lower lobe concerning for possible neoplasm. CT scan of the chest is recommended for further evaluation. Electronically Signed   By: Marijo Conception M.D.   On: 03/04/2019 15:49   Dg Hip Unilat With Pelvis 2-3 Views Left  Result Date: 03/04/2019 CLINICAL DATA:  [place] [address] today with left hip pain. EXAM: DG HIP (WITH OR WITHOUT PELVIS) 2-3V LEFT COMPARISON:  None. FINDINGS: No evidence of acute pelvic fracture. No evidence of left hip fracture. Old healed right femoral neck fracture IMPRESSION: No acute or traumatic finding. Electronically Signed   By: Nelson Chimes M.D.   On: 03/04/2019 13:34    Pending Labs Unresulted Labs (From admission, onward)    Start     Ordered   03/04/19 2149  SARS CORONAVIRUS 2 (TAT 6-24 HRS) Nasopharyngeal Nasopharyngeal Swab  (Asymptomatic/Tier  3)  Once,   STAT    Question Answer Comment  Is this test for diagnosis or screening Screening   Symptomatic for COVID-19 as defined by CDC No   Hospitalized for COVID-19 No   Admitted to ICU for COVID-19 No   Previously tested for COVID-19 No   Resident in a congregate (group) care setting No   Employed in healthcare setting No      03/04/19 2148   03/04/19 1531  Urine rapid drug screen (hosp performed)  ONCE - STAT,   STAT     03/04/19 1531   Signed and Held  Hemoglobin A1c  Tomorrow morning,   R     Signed and Held   Signed and Held  Lipid panel  Tomorrow morning,   R    Comments: Fasting    Signed and Held   Signed and Held  CBC  (enoxaparin (LOVENOX)    CrCl >/= 30 ml/min)  Once,   R    Comments: Baseline for enoxaparin therapy IF NOT ALREADY DRAWN.  Notify MD if PLT < 100 K.    Signed and Held   Signed and Held  Creatinine, serum  (enoxaparin (LOVENOX)    CrCl >/= 30 ml/min)  Once,   R    Comments: Baseline for enoxaparin therapy IF NOT ALREADY DRAWN.    Signed and Held   Signed and Held  Creatinine, serum  (enoxaparin (LOVENOX)    CrCl >/= 30  ml/min)  Weekly,   R    Comments: while on enoxaparin therapy    Signed and Held   Signed and Held  CBC  Tomorrow morning,   R     Signed and Held   Signed and Held  Comprehensive metabolic panel  Tomorrow morning,   R     Signed and Held          Vitals/Pain Today's Vitals   03/04/19 1518 03/04/19 2105 03/04/19 2106 03/04/19 2331  BP: 132/76 118/77    Pulse: 87 81 81   Resp: 16 16    Temp:      TempSrc:      SpO2: 96% 96% 95%   Height:      PainSc:  Asleep  0-No pain    Isolation Precautions No active isolations  Medications Medications  aspirin EC tablet 325 mg (325 mg Oral Given 03/04/19 2334)    Or  aspirin suppository 300 mg ( Rectal See Alternative 03/04/19 2334)    Mobility walks Moderate fall risk   Focused Assessments No family present .   R Recommendations: See Admitting Provider Note  Report given to:   Additional Notes:

## 2019-03-04 NOTE — ED Notes (Signed)
Pt allowed [employer] to complete swallow screen. Pt given happy meal and stated "I need some real food". Pt did not eat sandwich provided

## 2019-03-04 NOTE — ED Notes (Signed)
Pt continues to remove monitor, explained necessity of monitoring, pt states he will not keep it on.

## 2019-03-05 ENCOUNTER — Inpatient Hospital Stay (HOSPITAL_COMMUNITY): Payer: Medicaid Other

## 2019-03-05 ENCOUNTER — Encounter (HOSPITAL_COMMUNITY): Payer: Self-pay | Admitting: Radiology

## 2019-03-05 DIAGNOSIS — I25810 Atherosclerosis of coronary artery bypass graft(s) without angina pectoris: Secondary | ICD-10-CM | POA: Diagnosis not present

## 2019-03-05 DIAGNOSIS — I6389 Other cerebral infarction: Secondary | ICD-10-CM | POA: Diagnosis not present

## 2019-03-05 DIAGNOSIS — F1410 Cocaine abuse, uncomplicated: Secondary | ICD-10-CM | POA: Diagnosis not present

## 2019-03-05 DIAGNOSIS — F17200 Nicotine dependence, unspecified, uncomplicated: Secondary | ICD-10-CM | POA: Diagnosis not present

## 2019-03-05 DIAGNOSIS — I639 Cerebral infarction, unspecified: Secondary | ICD-10-CM | POA: Diagnosis not present

## 2019-03-05 LAB — COMPREHENSIVE METABOLIC PANEL
ALT: 8 U/L (ref 0–44)
AST: 11 U/L — ABNORMAL LOW (ref 15–41)
Albumin: 2.4 g/dL — ABNORMAL LOW (ref 3.5–5.0)
Alkaline Phosphatase: 54 U/L (ref 38–126)
Anion gap: 9 (ref 5–15)
BUN: 15 mg/dL (ref 8–23)
CO2: 24 mmol/L (ref 22–32)
Calcium: 8.3 mg/dL — ABNORMAL LOW (ref 8.9–10.3)
Chloride: 104 mmol/L (ref 98–111)
Creatinine, Ser: 0.92 mg/dL (ref 0.61–1.24)
GFR calc Af Amer: >60 mL/min (ref >60)
GFR calc non Af Amer: >60 mL/min (ref >60)
Glucose, Bld: 84 mg/dL (ref 70–99)
Potassium: 3.9 mmol/L (ref 3.5–5.1)
Sodium: 137 mmol/L (ref 135–145)
Total Bilirubin: 0.6 mg/dL (ref 0.3–1.2)
Total Protein: 6.3 g/dL — ABNORMAL LOW (ref 6.5–8.1)

## 2019-03-05 LAB — CREATININE, SERUM
Creatinine, Ser: 0.96 mg/dL (ref 0.61–1.24)
GFR calc Af Amer: >60 mL/min (ref >60)
GFR calc non Af Amer: >60 mL/min (ref >60)

## 2019-03-05 LAB — CBC
HCT: 32.2 % — ABNORMAL LOW (ref 39.0–52.0)
HCT: 33.3 % — ABNORMAL LOW (ref 39.0–52.0)
Hemoglobin: 10.4 g/dL — ABNORMAL LOW (ref 13.0–17.0)
Hemoglobin: 10.9 g/dL — ABNORMAL LOW (ref 13.0–17.0)
MCH: 29.6 pg (ref 26.0–34.0)
MCH: 29.9 pg (ref 26.0–34.0)
MCHC: 32.3 g/dL (ref 30.0–36.0)
MCHC: 32.7 g/dL (ref 30.0–36.0)
MCV: 91.7 fL (ref 80.0–100.0)
MCV: 91.2 fL (ref 80.0–100.0)
Platelets: 395 10*3/uL (ref 150–400)
Platelets: 427 10*3/uL — ABNORMAL HIGH (ref 150–400)
RBC: 3.51 MIL/uL — ABNORMAL LOW (ref 4.22–5.81)
RBC: 3.65 MIL/uL — ABNORMAL LOW (ref 4.22–5.81)
RDW: 13.7 % (ref 11.5–15.5)
RDW: 13.6 % (ref 11.5–15.5)
WBC: 6.3 10*3/uL (ref 4.0–10.5)
WBC: 5.8 10*3/uL (ref 4.0–10.5)
nRBC: 0 % (ref 0.0–0.2)
nRBC: 0 % (ref 0.0–0.2)

## 2019-03-05 LAB — RAPID URINE DRUG SCREEN, HOSP PERFORMED
Amphetamines: NOT DETECTED
Barbiturates: NOT DETECTED
Benzodiazepines: NOT DETECTED
Cocaine: POSITIVE — AB
Opiates: NOT DETECTED
Tetrahydrocannabinol: NOT DETECTED

## 2019-03-05 LAB — LIPID PANEL
Cholesterol: 184 mg/dL (ref 0–200)
HDL: 34 mg/dL — ABNORMAL LOW (ref >40)
LDL Cholesterol: 136 mg/dL — ABNORMAL HIGH (ref 0–99)
Total CHOL/HDL Ratio: 5.4 RATIO
Triglycerides: 72 mg/dL (ref <150)
VLDL: 14 mg/dL (ref 0–40)

## 2019-03-05 LAB — HEMOGLOBIN A1C
Hgb A1c MFr Bld: 6.3 % — ABNORMAL HIGH (ref 4.8–5.6)
Mean Plasma Glucose: 134.11 mg/dL

## 2019-03-05 LAB — SARS CORONAVIRUS 2 (TAT 6-24 HRS): SARS Coronavirus 2: NEGATIVE

## 2019-03-05 LAB — ECHOCARDIOGRAM COMPLETE: Height: 67 in

## 2019-03-05 MED ORDER — ACETAMINOPHEN 160 MG/5ML PO SOLN: 650.0000 mg | ORAL | Status: DC | PRN

## 2019-03-05 MED ORDER — IOHEXOL 350 MG/ML SOLN
40.0000 mL | Freq: Once | INTRAVENOUS | Status: AC | PRN
  Administered 2019-03-05: 40 mL via INTRAVENOUS

## 2019-03-05 MED ORDER — ENOXAPARIN SODIUM 40 MG/0.4ML ~~LOC~~ SOLN
40.0000 mg | Freq: Every day | SUBCUTANEOUS | Status: DC
  Administered 2019-03-05 – 2019-03-06 (×2): 40 mg via SUBCUTANEOUS
  Filled 2019-03-05 (×2): qty 0.4

## 2019-03-05 MED ORDER — ATORVASTATIN CALCIUM 40 MG PO TABS
40.0000 mg | ORAL_TABLET | Freq: Every day | ORAL | Status: DC
  Administered 2019-03-05: 40 mg via ORAL
  Filled 2019-03-05: qty 1

## 2019-03-05 MED ORDER — NICOTINE 21 MG/24HR TD PT24
21.0000 mg | MEDICATED_PATCH | Freq: Every day | TRANSDERMAL | Status: DC
  Administered 2019-03-05 – 2019-03-06 (×2): 21 mg via TRANSDERMAL
  Filled 2019-03-05 (×2): qty 1

## 2019-03-05 MED ORDER — STROKE: EARLY STAGES OF RECOVERY BOOK
Freq: Once | Status: AC
  Administered 2019-03-05: 01:00:00

## 2019-03-05 MED ORDER — SENNOSIDES-DOCUSATE SODIUM 8.6-50 MG PO TABS: 1.0000 | ORAL_TABLET | Freq: Every evening | ORAL | Status: DC | PRN

## 2019-03-05 MED ORDER — CLOPIDOGREL BISULFATE 75 MG PO TABS
75.0000 mg | ORAL_TABLET | Freq: Every day | ORAL | Status: DC
  Administered 2019-03-05 – 2019-03-06 (×2): 75 mg via ORAL
  Filled 2019-03-05 (×2): qty 1

## 2019-03-05 MED ORDER — SODIUM CHLORIDE 0.9 % IV SOLN
INTRAVENOUS | Status: DC
  Administered 2019-03-05 – 2019-03-06 (×3): via INTRAVENOUS

## 2019-03-05 MED ORDER — SODIUM CHLORIDE 0.9 % IV BOLUS
1000.0000 mL | Freq: Once | INTRAVENOUS | Status: AC
  Administered 2019-03-05: 1000 mL via INTRAVENOUS

## 2019-03-05 MED ORDER — IOHEXOL 350 MG/ML SOLN
75.0000 mL | Freq: Once | INTRAVENOUS | Status: AC | PRN
  Administered 2019-03-05: 75 mL via INTRAVENOUS

## 2019-03-05 MED ORDER — ACETAMINOPHEN 325 MG PO TABS
650.0000 mg | ORAL_TABLET | ORAL | Status: DC | PRN
  Administered 2019-03-06: 650 mg via ORAL
  Filled 2019-03-05: qty 2

## 2019-03-05 MED ORDER — ASPIRIN EC 81 MG PO TBEC
81.0000 mg | DELAYED_RELEASE_TABLET | Freq: Every day | ORAL | Status: DC
  Administered 2019-03-06: 81 mg via ORAL
  Filled 2019-03-05: qty 1

## 2019-03-05 MED ORDER — ACETAMINOPHEN 650 MG RE SUPP: 650.0000 mg | RECTAL | Status: DC | PRN

## 2019-03-05 NOTE — ED Notes (Signed)
PT with pt , got him up to walk

## 2019-03-05 NOTE — ED Notes (Signed)
Patient transported to CT scan . 

## 2019-03-05 NOTE — Progress Notes (Signed)
PROGRESS NOTE  [name] SWF:[id number] DOB: 07-23-1955 DOA: 03/04/2019 PCP: Medicine, [facility]   LOS: 1 day   Brief narrative: [name] is a 63 y.o. male with medical history significant of polysubstance abuse including cocaine, coronary artery disease with stents as well as coronary artery bypass graft back in 2010, ischemic cardiomyopathy with EF of 30 to 35%, tobacco abuse and hypertension who apparently sustained left-sided weakness in the last few days.  For 2 days prior to presentation, he had been feeling weak and dragging his left leg.  Patient was a poor historian.  He had been weak and debilitated and  was unable to move around so he called EMS that brought him to the ER.  Stroke suspected but appears to be at least 69 days old.  Neurology has seen the patient and evaluated him.  MRI done shows a right-sided CVA in the watershed distribution.  Patient was therefore being admitted to the hospital for full stroke work-up..  ED Course: Temperature 98.3 blood pressure 107/65 pulse 87 respirate of 6 oxygen sat 95% room air.  White count 7.1 hemoglobin 12.6 and platelets 434.  Calcium 8.7 the rest of the chemistry appears to be within normal.  Urinalysis is negative.  Alcohol level is less than 10.  MRI of the brain showed right-sided infarcts probably embolic versus watershed.  Appears to be at least 74 days old.   Assessment/Plan:  Principal Problem:   Acute cerebrovascular accident (CVA) ([place]) Active Problems:   TOBACCO ABUSE   Cocaine abuse ([place])   CAD, ARTERY BYPASS GRAFT  Acute to subacute CVA: Patient was admitted to hospital for full CVA work-up.  CT perfusion scan today showed no core infarct but ischemic penumbra in the right frontal region.  CT angiogram of the neck shows occlusion of the mid M1 segment of the right middle cerebral artery.  MRI of the head yesterday showed multifocal acute ischemia within the right MCA territory.  Pending 2D  echocardiogram and carotid duplex ultrasound. hemoglobin A1c noted at 6.3.  Continue aspirin.  Neurology/stroke team on board.  Lipid profile noted with LDL of 136.  Will follow neurology recommendations.  Check swallow evaluation.  history of cocaine abuse:  Urine drug screen shows  positive for cocaine.  Counseling done  tobacco abuse: Counseling provided.  Continue nicotine patch  coronary artery disease, history of ischemic cardiomyopathy:  Status post CABG.  Continues to abuse cocaine.  Previous echo showed EF of 30 to 35%.    No active issues at this time.  VTE Prophylaxis: Lovenox  Code Status: Full code  Family Communication: None  Disposition Plan: PT, OT evaluation pending.  Follow neurology recommendation.  Follow 2D echocardiogram, carotid duplex ultrasound   Consultants:  Stroke team  Procedures:  None  Antibiotics: Anti-infectives (From admission, onward)   None     Subjective: Today, patient denies interval complaints but feels like he is hungry.  Denies any chest pain, palpitation, shortness of breath.  Denies headache, nausea or vomiting  Objective: Vitals:   03/05/19 0400 03/05/19 0430  BP: 104/68 106/72  Pulse: 73 75  Resp: 14 13  Temp:    SpO2: 95% 96%   No intake or output data in the 24 hours ending 03/05/19 0852 There were no vitals filed for this visit. Body mass index is 18.66 kg/m.   Physical Exam: GENERAL: Patient is alert awake and oriented. Not in obvious distress.  Thinly built HENT: No scleral pallor or  icterus. Pupils equally reactive to light. Oral mucosa is moist NECK: is supple, no palpable thyroid enlargement. CHEST: Clear to auscultation. No crackles or wheezes. Non tender on palpation. Diminished breath sounds bilaterally. CVS: S1 and S2 heard, no murmur. Regular rate and rhythm. No pericardial rub.  CABG scar noted. ABDOMEN: Soft, non-tender, bowel sounds are present. No palpable hepato-splenomegaly. EXTREMITIES:  Strength 5/5 in the right upper and lower extremity.  Power 4/5 in the left upper and lower extremity. CNS:   Strength 5/5 in the right upper and lower extremity.  Power 4/5 in the left upper and lower extremity. SKIN: warm and dry without rashes.  Data Review: I have personally reviewed the following laboratory data and studies,  CBC: Recent Labs  Lab 03/04/19 1218 03/04/19 1531 03/04/19 1558 03/05/19 0133 03/05/19 0443  WBC 6.4 7.1  --  6.3 5.8  NEUTROABS  --  5.9  --   --   --   HGB 11.8* 11.9* 12.6* 10.4* 10.9*  HCT 36.6* 36.5* 37.0* 32.2* 33.3*  MCV 92.4 91.3  --  91.7 91.2  PLT 490* 434*  --  395 329*   Basic Metabolic Panel: Recent Labs  Lab 03/04/19 1218 03/04/19 1531 03/04/19 1558 03/05/19 0133 03/05/19 0443  NA 136  --  137  --  137  K 4.0  --  4.0  --  3.9  CL 99  --  101  --  104  CO2 25  --   --   --  24  GLUCOSE 90  --  119*  --  84  BUN 14  --  14  --  15  CREATININE 0.97  --  0.90 0.96 0.92  CALCIUM 8.7*  --   --   --  8.3*  MG  --  2.3  --   --   --    Liver Function Tests: Recent Labs  Lab 03/04/19 1531 03/05/19 0443  AST 13* 11*  ALT 10 8  ALKPHOS 62 54  BILITOT 0.5 0.6  PROT 7.2 6.3*  ALBUMIN 2.8* 2.4*   Recent Labs  Lab 03/04/19 1531  LIPASE 20   No results for input(s): AMMONIA in the last 168 hours. Cardiac Enzymes: No results for input(s): CKTOTAL, CKMB, CKMBINDEX, TROPONINI in the last 168 hours. BNP (last 3 results) No results for input(s): BNP in the last 8760 hours.  ProBNP (last 3 results) No results for input(s): PROBNP in the last 8760 hours.  CBG: Recent Labs  Lab 03/04/19 1216  GLUCAP 77   Recent Results (from the past 240 hour(s))  SARS CORONAVIRUS 2 (TAT 6-24 HRS) Nasopharyngeal Nasopharyngeal Swab     Status: None   Collection Time: 03/04/19 10:40 PM   Specimen: Nasopharyngeal Swab  Result Value Ref Range Status   SARS Coronavirus 2 NEGATIVE NEGATIVE Final    Comment: (NOTE) SARS-CoV-2 target nucleic  acids are NOT DETECTED. The SARS-CoV-2 RNA is generally detectable in upper and lower respiratory specimens during the acute phase of infection. Negative results do not preclude SARS-CoV-2 infection, do not rule out co-infections with other pathogens, and should not be used as the sole basis for treatment or other patient management decisions. Negative results must be combined with clinical observations, patient history, and epidemiological information. The expected result is Negative. Fact Sheet for Patients: SugarRoll.be Fact Sheet for Healthcare Providers: https://www.woods-mathews.com/ This test is not yet approved or cleared by the [place] FDA and  has been authorized for detection and/or diagnosis of SARS-CoV-2  by FDA under an Emergency Use Authorization (EUA). This EUA will remain  in effect (meaning this test can be used) for the duration of the COVID-19 declaration under Section 56 4(b)(1) of the Act, 21 U.S.C. section 360bbb-3(b)(1), unless the authorization is terminated or revoked sooner. Performed at [facility], [place] [address] [place], [place] [address]      Studies: Ct Angio Head W Or Wo Contrast  Result Date: 03/05/2019 CLINICAL DATA:  Stroke follow-up EXAM: CT ANGIOGRAPHY HEAD AND NECK TECHNIQUE: Multidetector CT imaging of the head and neck was performed using the standard protocol during bolus administration of intravenous contrast. Multiplanar CT image reconstructions and MIPs were obtained to evaluate the vascular anatomy. Carotid stenosis measurements (when applicable) are obtained utilizing NASCET criteria, using the distal internal carotid diameter as the denominator. CONTRAST:  69mL OMNIPAQUE IOHEXOL 350 MG/ML SOLN COMPARISON:  None. FINDINGS: CTA NECK FINDINGS SKELETON: There is no bony spinal canal stenosis. No lytic or blastic lesion. OTHER NECK: Normal pharynx, larynx and major salivary glands. No cervical  lymphadenopathy. Unremarkable thyroid gland. UPPER CHEST: No pneumothorax or pleural effusion. No nodules or masses. AORTIC ARCH: There is no calcific atherosclerosis of the aortic arch. There is no aneurysm, dissection or hemodynamically significant stenosis of the visualized portion of the aorta. Conventional 3 vessel aortic branching pattern. The visualized proximal subclavian arteries are widely patent. RIGHT CAROTID SYSTEM: No dissection, occlusion or aneurysm. Mild atherosclerotic calcification at the carotid bifurcation without hemodynamically significant stenosis. LEFT CAROTID SYSTEM: Normal without aneurysm, dissection or stenosis. VERTEBRAL ARTERIES: Left dominant configuration. Both origins are clearly patent. There is no dissection, occlusion or flow-limiting stenosis to the skull base (V1-V3 segments). CTA HEAD FINDINGS POSTERIOR CIRCULATION: --Vertebral arteries: Normal V4 segments. --Posterior inferior cerebellar arteries (PICA): Patent origins from the vertebral arteries. --Anterior inferior cerebellar arteries (AICA): Patent origins from the basilar artery. --Basilar artery: Normal. --Superior cerebellar arteries: Normal. --Posterior cerebral arteries: Normal. Both originate from the basilar artery. Posterior communicating arteries (p-comm) are diminutive or absent. ANTERIOR CIRCULATION: --Intracranial internal carotid arteries: There are severe stenoses of the cavernous segments of both internal carotid arteries due to calcific atherosclerosis. --Anterior cerebral arteries (ACA): Normal. Both A1 segments are present. Patent anterior communicating artery (a-comm). --Middle cerebral arteries (MCA): There is occlusion of the mid M1 segment of the right middle cerebral artery. The M2 branches are patent. VENOUS SINUSES: As permitted by contrast timing, patent. ANATOMIC VARIANTS: None Review of the MIP images confirms the above findings. IMPRESSION: 1. Occlusion of the mid M1 segment of the right middle  cerebral artery. 2. 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 These results were communicated to Dr. Roland Rack at 12:31 am on 03/05/2019 by text page via the [facility] messaging system. Electronically Signed   By: Ulyses Jarred M.D.   On: 03/05/2019 00:33   Ct Angio Neck W Or Wo Contrast  Result Date: 03/05/2019 CLINICAL DATA:  Stroke follow-up EXAM: CT ANGIOGRAPHY HEAD AND NECK TECHNIQUE: Multidetector CT imaging of the head and neck was performed using the standard protocol during bolus administration of intravenous contrast. Multiplanar CT image reconstructions and MIPs were obtained to evaluate the vascular anatomy. Carotid stenosis measurements (when applicable) are obtained utilizing NASCET criteria, using the distal internal carotid diameter as the denominator. CONTRAST:  48mL OMNIPAQUE IOHEXOL 350 MG/ML SOLN COMPARISON:  None. FINDINGS: CTA NECK FINDINGS SKELETON: There is no bony spinal canal stenosis. No lytic or blastic lesion. OTHER NECK:  Normal pharynx, larynx and major salivary glands. No cervical lymphadenopathy. Unremarkable thyroid gland. UPPER CHEST: No pneumothorax or pleural effusion. No nodules or masses. AORTIC ARCH: There is no calcific atherosclerosis of the aortic arch. There is no aneurysm, dissection or hemodynamically significant stenosis of the visualized portion of the aorta. Conventional 3 vessel aortic branching pattern. The visualized proximal subclavian arteries are widely patent. RIGHT CAROTID SYSTEM: No dissection, occlusion or aneurysm. Mild atherosclerotic calcification at the carotid bifurcation without hemodynamically significant stenosis. LEFT CAROTID SYSTEM: Normal without aneurysm, dissection or stenosis. VERTEBRAL ARTERIES: Left dominant configuration. Both origins are clearly patent. There is no dissection, occlusion or flow-limiting stenosis to the skull base (V1-V3 segments). CTA HEAD  FINDINGS POSTERIOR CIRCULATION: --Vertebral arteries: Normal V4 segments. --Posterior inferior cerebellar arteries (PICA): Patent origins from the vertebral arteries. --Anterior inferior cerebellar arteries (AICA): Patent origins from the basilar artery. --Basilar artery: Normal. --Superior cerebellar arteries: Normal. --Posterior cerebral arteries: Normal. Both originate from the basilar artery. Posterior communicating arteries (p-comm) are diminutive or absent. ANTERIOR CIRCULATION: --Intracranial internal carotid arteries: There are severe stenoses of the cavernous segments of both internal carotid arteries due to calcific atherosclerosis. --Anterior cerebral arteries (ACA): Normal. Both A1 segments are present. Patent anterior communicating artery (a-comm). --Middle cerebral arteries (MCA): There is occlusion of the mid M1 segment of the right middle cerebral artery. The M2 branches are patent. VENOUS SINUSES: As permitted by contrast timing, patent. ANATOMIC VARIANTS: None Review of the MIP images confirms the above findings. IMPRESSION: 1. 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 2. 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 These results were communicated to Dr. Roland Rack at 12:31 am on 03/05/2019 by text page via the [facility] messaging system. Electronically Signed   By: Ulyses Jarred M.D.   On: 03/05/2019 00:33   Mr Brain Wo Contrast (neuro Protocol)  Result Date: 03/04/2019 CLINICAL DATA:  Ataxia EXAM: MRI HEAD WITHOUT CONTRAST TECHNIQUE: Multiplanar, multiecho pulse sequences of the brain and surrounding structures were obtained without intravenous contrast. COMPARISON:  None. FINDINGS: Brain: There is multifocal acute ischemia within the right hemisphere, scattered within the right MCA territory with the largest lesions in the right frontal lobe and anterior right temporal lobe. No acute  hemorrhage. No midline shift or other mass effect. Brain volume is normal. No hydrocephalus. There is mild cytotoxic edema associated with the ischemic sites but the brain parenchyma is otherwise normal. No chronic microhemorrhage. The midline structures are normal. Vascular: Flow voids are preserved. Skull and upper cervical spine: Normal bone marrow signal. Visualized extracranial soft tissues are normal. Sinuses/Orbits: Orbits are normal. No mastoid or middle ear effusion. Paranasal sinuses are clear. Other: None IMPRESSION: Multifocal acute ischemia within the right MCA territory. No hemorrhage or mass effect. Electronically Signed   By: Ulyses Jarred M.D.   On: 03/04/2019 19:11   Ct Cerebral Perfusion W Contrast  Result Date: 03/05/2019 CLINICAL DATA:  Stroke follow-up. History of cocaine abuse. EXAM: CT PERFUSION BRAIN TECHNIQUE: Multiphase CT imaging of the brain was performed following IV bolus contrast injection. Subsequent parametric perfusion maps were calculated using RAPID software. CONTRAST:  102mL OMNIPAQUE IOHEXOL 350 MG/ML SOLN COMPARISON:  None. FINDINGS: CT Brain Perfusion Findings: CBF (<30%) Volume: 18mL Perfusion (Tmax>6.0s) volume: 78mL. T-max is elevated throughout much of the right MCA territory. Using a Tmax threshold of 4 seconds instead of 6 seconds demonstrates a larger region of ischemia measuring 100 mL. Mismatch  Volume: 38mL Infarct Core: 0 mL Infarction Location: CT perfusion parameters do not demonstrated core infarct. However, earlier brain MRI shows abnormal diffusion restriction at multiple locations in the right MCA territory. No recent non-contrast head CT is available for determining ASPECTS. IMPRESSION: 1. No core infarct identified by CT perfusion parameters. However, earlier brain MRI shows abnormal diffusion restriction at multiple locations in the right MCA territory. 2. 7 mL region within the right frontal operculum meeting parameters for ischemic penumbra. Using a Tmax  threshold of 4 seconds instead of 6 seconds demonstrates a larger region of ischemia measuring 100 mL. Electronically Signed   By: Ulyses Jarred M.D.   On: 03/05/2019 01:47   Dg Chest Port 1 View  Result Date: 03/04/2019 CLINICAL DATA:  Weakness, fall. EXAM: PORTABLE CHEST 1 VIEW COMPARISON:  April 18, 2017. FINDINGS: The heart size and mediastinal contours are within normal limits. Status post coronary bypass graft. No pneumothorax or pleural effusion is noted. Left lung is clear. Large rounded density is noted in right lower lobe concerning for possible neoplasm. The visualized skeletal structures are unremarkable. IMPRESSION: Large rounded density seen in right lower lobe concerning for possible neoplasm. CT scan of the chest is recommended for further evaluation. Electronically Signed   By: Marijo Conception M.D.   On: 03/04/2019 15:49   Dg Hip Unilat With Pelvis 2-3 Views Left  Result Date: 03/04/2019 CLINICAL DATA:  [place] today with left hip pain. EXAM: DG HIP (WITH OR WITHOUT PELVIS) 2-3V LEFT COMPARISON:  None. FINDINGS: No evidence of acute pelvic fracture. No evidence of left hip fracture. Old healed right femoral neck fracture IMPRESSION: No acute or traumatic finding. Electronically Signed   By: Nelson Chimes M.D.   On: 03/04/2019 13:34    Scheduled Meds:  aspirin EC  325 mg Oral Daily   Or   aspirin  300 mg Rectal Daily   enoxaparin (LOVENOX) injection  40 mg Subcutaneous Daily   nicotine  21 mg Transdermal Daily    Continuous Infusions:  sodium chloride 100 mL/hr at 03/05/19 0106     Flora Lipps, MD  [facility] 03/05/2019

## 2019-03-05 NOTE — Progress Notes (Signed)
Arrived from ED. Alert and oriented. Denies any pain. Call light within reach.

## 2019-03-05 NOTE — ED Notes (Signed)
Patient transported to CT 

## 2019-03-05 NOTE — Evaluation (Addendum)
Physical Therapy Evaluation Patient Details Name: [name] MRN: [id number] DOB: 1955/04/06 Today's Date: 03/05/2019   History of Present Illness  [name] is a 63 y.o. male with medical history significant of polysubstance abuse including cocaine, coronary artery disease with stents as well as coronary artery bypass graft back in 2010, ischemic cardiomyopathy with EF of 30 to 35%, tobacco abuse and hypertension who apparently sustained left-sided weakness in the last few days.  MRI positive for Multifocal acute ischemia within the right MCA territory.  Clinical Impression  Patient presents with decreased mobility due to L sided weakness, decreased balance and decreased L side awareness.  Previously independent working in concrete.  Feel he will benefit from skilled PT in the acute setting to progress mobility.  Recommend CIR level rehab as feel though he lives alone could achieve mod I prior to d/c.  Patient is motivated and currently mainly min a level.     Follow Up Recommendations CIR    Equipment Recommendations  Rolling walker with 5" wheels    Recommendations for Other Services Rehab consult     Precautions / Restrictions Precautions Precautions: Fall Precaution Comments: L weakness      Mobility  Bed Mobility Overal bed mobility: Needs Assistance Bed Mobility: Supine to Sit     Supine to sit: Min assist;HOB elevated     General bed mobility comments: assist for legs off bed  Transfers Overall transfer level: Needs assistance Equipment used: Standard walker Transfers: Sit to/from Stand;Stand Pivot Transfers Sit to Stand: Mod assist Stand pivot transfers: Mod assist       General transfer comment: some lifting help from EOB; assist for balance and to move walker to sit in chair beside bed  Ambulation/Gait Ambulation/Gait assistance: Min assist Gait Distance (Feet): 90 Feet Assistive device: Standard walker Gait Pattern/deviations: Step-to  pattern;Step-through pattern;Decreased step length - left;Decreased stance time - left     General Gait Details: difficulty with using SW (only one available in ED), then needing assist for balance, cues to prevent anterior LOB  Stairs            Wheelchair Mobility    Modified Rankin (Stroke Patients Only) Modified Rankin (Stroke Patients Only) Pre-Morbid Rankin Score: No symptoms Modified Rankin: Moderately severe disability     Balance Overall balance assessment: Needs assistance Sitting-balance support: Feet supported Sitting balance-Leahy Scale: Fair     Standing balance support: Single extremity supported;Bilateral upper extremity supported;During functional activity Standing balance-Leahy Scale: Poor Standing balance comment: assist to doff shorts due to soiled with urine, in standing picked up one foot at a time to remove and min A for balance as well with RW                             Pertinent Vitals/Pain Pain Assessment: No/denies pain    Home Living Family/patient expects to be discharged to:: Private residence Living Arrangements: Alone Available Help at Discharge: Friend(s) Type of Home: Apartment Home Access: Level entry     Home Layout: One level Home Equipment: None      Prior Function Level of Independence: Independent         Comments: works in [employer]        Extremity/Trunk Assessment   Upper Extremity Assessment Upper Extremity Assessment: LUE deficits/detail LUE Deficits / Details: with testing in supine did not grip or move arm actively to command, but moves  and uses with mobility placing hand on walker and even gripping it some    Lower Extremity Assessment Lower Extremity Assessment: LLE deficits/detail LLE Deficits / Details: AROM WFL, strength hip flexion 3-/5, knee extension 4/5       Communication   Communication: No difficulties  Cognition Arousal/Alertness: Awake/alert Behavior  During Therapy: WFL for tasks assessed/performed Overall Cognitive Status: Impaired/Different from baseline Area of Impairment: Attention;Problem solving                   Current Attention Level: Sustained         Problem Solving: Slow processing General Comments: some decreased L awareness      General Comments      Exercises     Assessment/Plan    PT Assessment Patient needs continued PT services  PT Problem List Decreased strength;Decreased activity tolerance;Decreased mobility;Decreased safety awareness;Decreased cognition;Decreased knowledge of use of DME;Decreased balance       PT Treatment Interventions DME instruction;Stair training;Therapeutic activities;Balance training;Therapeutic exercise;Functional mobility training;Gait training;Neuromuscular re-education;Patient/family education    PT Goals (Current goals can be found in the Care Plan section)  Acute Rehab PT Goals Patient Stated Goal: to return to independent PT Goal Formulation: With patient Time For Goal Achievement: 03/19/19 Potential to Achieve Goals: Good    Frequency Min 4X/week   Barriers to discharge Decreased caregiver support      Co-evaluation               AM-PAC PT "6 Clicks" Mobility  Outcome Measure Help needed turning from your back to your side while in a flat bed without using bedrails?: A Little Help needed moving from lying on your back to sitting on the side of a flat bed without using bedrails?: A Little Help needed moving to and from a bed to a chair (including a wheelchair)?: A Little Help needed standing up from a chair using your arms (e.g., wheelchair or bedside chair)?: A Little Help needed to walk in hospital room?: A Little Help needed climbing 3-5 steps with a railing? : A Lot 6 Click Score: 17    End of Session   Activity Tolerance: Patient tolerated treatment well Patient left: in bed;with call bell/phone within reach   PT Visit Diagnosis: Other  abnormalities of gait and mobility (R26.89);Hemiplegia and hemiparesis Hemiplegia - dominant/non-dominant: Non-dominant Hemiplegia - caused by: Cerebral infarction;Nontraumatic intracerebral hemorrhage    Time: 7169-6789 PT Time Calculation (min) (ACUTE ONLY): 34 min   Charges:   PT Evaluation $PT Eval Moderate Complexity: 1 Mod PT Treatments $Gait Training: 8-22 mins        Magda Kiel, PT Acute Rehabilitation Services [phone] 03/05/2019   Reginia Naas 03/05/2019, 6:03 PM

## 2019-03-05 NOTE — Progress Notes (Signed)
*  PRELIMINARY RESULTS* Echocardiogram 2D Echocardiogram has been performed.  [name] 03/05/2019, 10:52 AM

## 2019-03-05 NOTE — ED Notes (Signed)
Ordered a hospital bed per rn Autumn--Tonianne Fine

## 2019-03-05 NOTE — ED Notes (Signed)
Breakfast tray ordered 

## 2019-03-05 NOTE — Progress Notes (Signed)
STROKE TEAM PROGRESS NOTE   INTERVAL HISTORY Patient in the ED. Is frustrated because he is hungry.  I have obtained history of presenting illness in detail with the patient, reviewed electronic medical records and imaging films in PACS.  His urine drug screen was positive for cocaine and he admits to doing it.  Vitals:   03/05/19 0200 03/05/19 0300 03/05/19 0400 03/05/19 0430  BP: 110/74 101/70 104/68 106/72  Pulse: 80 81 73 75  Resp:  14 14 13   Temp:      TempSrc:      SpO2: 94% 94% 95% 96%  Height:        CBC:  Recent Labs  Lab 03/04/19 1531  03/05/19 0133 03/05/19 0443  WBC 7.1  --  6.3 5.8  NEUTROABS 5.9  --   --   --   HGB 11.9*   < > 10.4* 10.9*  HCT 36.5*   < > 32.2* 33.3*  MCV 91.3  --  91.7 91.2  PLT 434*  --  395 427*   < > = values in this interval not displayed.    Basic Metabolic Panel:  Recent Labs  Lab 03/04/19 1218 03/04/19 1531 03/04/19 1558 03/05/19 0133 03/05/19 0443  NA 136  --  137  --  137  K 4.0  --  4.0  --  3.9  CL 99  --  101  --  104  CO2 25  --   --   --  24  GLUCOSE 90  --  119*  --  84  BUN 14  --  14  --  15  CREATININE 0.97  --  0.90 0.96 0.92  CALCIUM 8.7*  --   --   --  8.3*  MG  --  2.3  --   --   --    Lipid Panel:     Component Value Date/Time   CHOL 184 03/05/2019 0443   TRIG 72 03/05/2019 0443   HDL 34 (L) 03/05/2019 0443   CHOLHDL 5.4 03/05/2019 0443   VLDL 14 03/05/2019 0443   LDLCALC 136 (H) 03/05/2019 0443   HgbA1c:  Lab Results  Component Value Date   HGBA1C 6.3 (H) 03/05/2019   Urine Drug Screen:     Component Value Date/Time   LABOPIA NONE DETECTED 03/05/2019 0133   COCAINSCRNUR POSITIVE (A) 03/05/2019 0133   COCAINSCRNUR POS (A) 12/09/2007 2020   LABBENZ NONE DETECTED 03/05/2019 0133   LABBENZ NEG 12/09/2007 2020   AMPHETMU NONE DETECTED 03/05/2019 0133   THCU NONE DETECTED 03/05/2019 0133   LABBARB NONE DETECTED 03/05/2019 0133    Alcohol Level     Component Value Date/Time   ETH <10  03/04/2019 1552    IMAGING Ct Angio Head W Or Wo Contrast  Result Date: 03/05/2019 CLINICAL DATA:  Stroke follow-up EXAM: CT ANGIOGRAPHY HEAD AND NECK TECHNIQUE: Multidetector CT imaging of the head and neck was performed using the standard protocol during bolus administration of intravenous contrast. Multiplanar CT image reconstructions and MIPs were obtained to evaluate the vascular anatomy. Carotid stenosis measurements (when applicable) are obtained utilizing NASCET criteria, using the distal internal carotid diameter as the denominator. CONTRAST:  69mL OMNIPAQUE IOHEXOL 350 MG/ML SOLN COMPARISON:  None. FINDINGS: CTA NECK FINDINGS SKELETON: There is no bony spinal canal stenosis. No lytic or blastic lesion. OTHER NECK: Normal pharynx, larynx and major salivary glands. No cervical lymphadenopathy. Unremarkable thyroid gland. UPPER CHEST: No pneumothorax or pleural effusion. No nodules or masses.  AORTIC ARCH: There is no calcific atherosclerosis of the aortic arch. There is no aneurysm, dissection or hemodynamically significant stenosis of the visualized portion of the aorta. Conventional 3 vessel aortic branching pattern. The visualized proximal subclavian arteries are widely patent. RIGHT CAROTID SYSTEM: No dissection, occlusion or aneurysm. Mild atherosclerotic calcification at the carotid bifurcation without hemodynamically significant stenosis. LEFT CAROTID SYSTEM: Normal without aneurysm, dissection or stenosis. VERTEBRAL ARTERIES: Left dominant configuration. Both origins are clearly patent. There is no dissection, occlusion or flow-limiting stenosis to the skull base (V1-V3 segments). CTA HEAD FINDINGS POSTERIOR CIRCULATION: --Vertebral arteries: Normal V4 segments. --Posterior inferior cerebellar arteries (PICA): Patent origins from the vertebral arteries. --Anterior inferior cerebellar arteries (AICA): Patent origins from the basilar artery. --Basilar artery: Normal. --Superior cerebellar  arteries: Normal. --Posterior cerebral arteries: Normal. Both originate from the basilar artery. Posterior communicating arteries (p-comm) are diminutive or absent. ANTERIOR CIRCULATION: --Intracranial internal carotid arteries: There are severe stenoses of the cavernous segments of both internal carotid arteries due to calcific atherosclerosis. --Anterior cerebral arteries (ACA): Normal. Both A1 segments are present. Patent anterior communicating artery (a-comm). --Middle cerebral arteries (MCA): There is occlusion of the mid M1 segment of the right middle cerebral artery. The M2 branches are patent. VENOUS SINUSES: As permitted by contrast timing, patent. ANATOMIC VARIANTS: None Review of the MIP images confirms the above findings. IMPRESSION: 1. 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 2. 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 These results were communicated to Dr. Roland Rack at 12:31 am on 03/05/2019 by text page via the [facility] messaging system. Electronically Signed   By: Ulyses Jarred M.D.   On: 03/05/2019 00:33   Ct Angio Neck W Or Wo Contrast  Result Date: 03/05/2019 CLINICAL DATA:  Stroke follow-up EXAM: CT ANGIOGRAPHY HEAD AND NECK TECHNIQUE: Multidetector CT imaging of the head and neck was performed using the standard protocol during bolus administration of intravenous contrast. Multiplanar CT image reconstructions and MIPs were obtained to evaluate the vascular anatomy. Carotid stenosis measurements (when applicable) are obtained utilizing NASCET criteria, using the distal internal carotid diameter as the denominator. CONTRAST:  79mL OMNIPAQUE IOHEXOL 350 MG/ML SOLN COMPARISON:  None. FINDINGS: CTA NECK FINDINGS SKELETON: There is no bony spinal canal stenosis. No lytic or blastic lesion. OTHER NECK: Normal pharynx, larynx and major salivary glands. No cervical lymphadenopathy. Unremarkable  thyroid gland. UPPER CHEST: No pneumothorax or pleural effusion. No nodules or masses. AORTIC ARCH: There is no calcific atherosclerosis of the aortic arch. There is no aneurysm, dissection or hemodynamically significant stenosis of the visualized portion of the aorta. Conventional 3 vessel aortic branching pattern. The visualized proximal subclavian arteries are widely patent. RIGHT CAROTID SYSTEM: No dissection, occlusion or aneurysm. Mild atherosclerotic calcification at the carotid bifurcation without hemodynamically significant stenosis. LEFT CAROTID SYSTEM: Normal without aneurysm, dissection or stenosis. VERTEBRAL ARTERIES: Left dominant configuration. Both origins are clearly patent. There is no dissection, occlusion or flow-limiting stenosis to the skull base (V1-V3 segments). CTA HEAD FINDINGS POSTERIOR CIRCULATION: --Vertebral arteries: Normal V4 segments. --Posterior inferior cerebellar arteries (PICA): Patent origins from the vertebral arteries. --Anterior inferior cerebellar arteries (AICA): Patent origins from the basilar artery. --Basilar artery: Normal. --Superior cerebellar arteries: Normal. --Posterior cerebral arteries: Normal. Both originate from the basilar artery. Posterior communicating arteries (p-comm) are diminutive or absent. ANTERIOR CIRCULATION: --Intracranial internal carotid arteries: There are severe stenoses of the cavernous segments of both internal carotid arteries due to  calcific atherosclerosis. --Anterior cerebral arteries (ACA): Normal. Both A1 segments are present. Patent anterior communicating artery (a-comm). --Middle cerebral arteries (MCA): There is occlusion of the mid M1 segment of the right middle cerebral artery. The M2 branches are patent. VENOUS SINUSES: As permitted by contrast timing, patent. ANATOMIC VARIANTS: None Review of the MIP images confirms the above findings. IMPRESSION: 1. 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 2. Severe  stenoses of the cavernous segments of both internal carotid arteries secondary to calcific atherosclerosis. These results were communicated to Dr. Roland Rack at 12:31 am on 03/05/2019 by text page via the [facility] messaging system. Electronically Signed   By: Ulyses Jarred M.D.   On: 03/05/2019 00:33   Mr Brain Wo Contrast (neuro Protocol)  Result Date: 03/04/2019 CLINICAL DATA:  Ataxia EXAM: MRI HEAD WITHOUT CONTRAST TECHNIQUE: Multiplanar, multiecho pulse sequences of the brain and surrounding structures were obtained without intravenous contrast. COMPARISON:  None. FINDINGS: Brain: There is multifocal acute ischemia within the right hemisphere, scattered within the right MCA territory with the largest lesions in the right frontal lobe and anterior right temporal lobe. No acute hemorrhage. No midline shift or other mass effect. Brain volume is normal. No hydrocephalus. There is mild cytotoxic edema associated with the ischemic sites but the brain parenchyma is otherwise normal. No chronic microhemorrhage. The midline structures are normal. Vascular: Flow voids are preserved. Skull and upper cervical spine: Normal bone marrow signal. Visualized extracranial soft tissues are normal. Sinuses/Orbits: Orbits are normal. No mastoid or middle ear effusion. Paranasal sinuses are clear. Other: None IMPRESSION: Multifocal acute ischemia within the right MCA territory. No hemorrhage or mass effect. Electronically Signed   By: Ulyses Jarred M.D.   On: 03/04/2019 19:11   Ct Cerebral Perfusion W Contrast  Result Date: 03/05/2019 CLINICAL DATA:  Stroke follow-up. History of cocaine abuse. EXAM: CT PERFUSION BRAIN TECHNIQUE: Multiphase CT imaging of the brain was performed following IV bolus contrast injection. Subsequent parametric perfusion maps were calculated using RAPID software. CONTRAST:  58mL OMNIPAQUE IOHEXOL 350 MG/ML SOLN COMPARISON:  None. FINDINGS: CT Brain Perfusion Findings: CBF (<30%) Volume: 82mL  Perfusion (Tmax>6.0s) volume: 70mL. T-max is elevated throughout much of the right MCA territory. Using a Tmax threshold of 4 seconds instead of 6 seconds demonstrates a larger region of ischemia measuring 100 mL. Mismatch Volume: 50mL Infarct Core: 0 mL Infarction Location: CT perfusion parameters do not demonstrated core infarct. However, earlier brain MRI shows abnormal diffusion restriction at multiple locations in the right MCA territory. No recent non-contrast head CT is available for determining ASPECTS. IMPRESSION: 1. No core infarct identified by CT perfusion parameters. However, earlier brain MRI shows abnormal diffusion restriction at multiple locations in the right MCA territory. 2. 7 mL region within the right frontal operculum meeting parameters for ischemic penumbra. Using a Tmax threshold of 4 seconds instead of 6 seconds demonstrates a larger region of ischemia measuring 100 mL. Electronically Signed   By: Ulyses Jarred M.D.   On: 03/05/2019 01:47   Dg Chest Port 1 View  Result Date: 03/04/2019 CLINICAL DATA:  Weakness, fall. EXAM: PORTABLE CHEST 1 VIEW COMPARISON:  April 18, 2017. FINDINGS: The heart size and mediastinal contours are within normal limits. Status post coronary bypass graft. No pneumothorax or pleural effusion is noted. Left lung is clear. Large rounded density is noted in right lower lobe concerning for possible neoplasm. The visualized skeletal structures are unremarkable. IMPRESSION: Large rounded density seen in right  lower lobe concerning for possible neoplasm. CT scan of the chest is recommended for further evaluation. Electronically Signed   By: Marijo Conception M.D.   On: 03/04/2019 15:49   Dg Hip Unilat With Pelvis 2-3 Views Left  Result Date: 03/04/2019 CLINICAL DATA:  [place] today with left hip pain. EXAM: DG HIP (WITH OR WITHOUT PELVIS) 2-3V LEFT COMPARISON:  None. FINDINGS: No evidence of acute pelvic fracture. No evidence of left hip fracture. Old healed right  femoral neck fracture IMPRESSION: No acute or traumatic finding. Electronically Signed   By: Nelson Chimes M.D.   On: 03/04/2019 13:34    PHYSICAL EXAM Frail cachectic looking middle-aged African-American male not in distress but extremely irritable and restless. . Afebrile. Head is nontraumatic. Neck is supple without bruit.  Prominent midline scar from CABG surgery.  Cardiac exam no murmur or gallop. Lungs are clear to auscultation. Distal pulses are well felt. Neurological Exam ;  Awake  Alert oriented x 3. Normal speech and language.eye movements full without nystagmus but there is slight right gaze preference though he can look to the left all the way..fundi were not visualized. Vision acuity and fields appear normal. Hearing is normal. Palatal movements are normal. Face symmetric. Tongue midline. Normal strength, tone, reflexes and coordination on the right but mild left hemiparesis with 4/5 strength.  Diminished left grip strength.  Diminished fine finger movements on the left.  Orbits right over left upper extremity.. Normal sensation. Gait deferred. ASSESSMENT/PLAN [name] is a 63 y.o. male with history of CAD s/p CABG, cocaine abuse, ischemic cardiomyopathy and tobacco abuse presenting with L sided weakness.   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 Severe B ICA cavernous stenosis   CT perfusion no core infarct. 7 mL R frontal operculum ischemic penumbra.   Carotid Doppler  pending   2D Echo pending   LDL 136  HgbA1c 6.3  Lovenox 40 mg sq daily for VTE prophylaxis  aspirin 81 mg daily prescribed prior to admission but pt not taking, now on aspirin 325 mg daily. Given mild stroke, recommend aspirin 81 mg and plavix 75 mg daily x 3 weeks, then aspirin alone. Orders adjusted.   Therapy recommendations:  pending   Disposition:  pending   Hypertension  Stable . Permissive  hypertension (OK if < 220/120) but gradually normalize in 5-7 days . Long-term BP goal normotensive  Hyperlipidemia  Home meds:  lipitor 40 listed but pt not taking  Resume lipitor 40  LDL 136, goal < 70  Continue statin at discharge  Pre-Diabetes   HgbA1c 6.3  Other Stroke Risk Factors  Cigarette smoker, advised to stop smoking  ETOH use, alcohol level <10, advised to drink no more than 2 drink(s) a day  Substance abuse UDS:  Cocaine POSITIVE. Patient advised to stop using due to stroke risk.  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  Recommend continue ongoing stroke work-up.  Aspirin and Plavix for 3 weeks followed by aspirin alone.  Aggressive risk factor modification.  Patient counseled to quit using cocaine and smoking cigarettes and agreed to do so.  Discussed with patient and Dr.  Lakxman.  Greater than 50% time during this 35-minute visit was spent on counseling and coordination of care about his embolic stroke and cocaine cardiomyopathy and answering questions Antony Contras, MD To contact Stroke Continuity provider, please refer to [email or web address] After hours, contact General Neurology

## 2019-03-05 NOTE — ED Notes (Signed)
Pt resting comfortably at this time.

## 2019-03-05 NOTE — ED Notes (Signed)
Assumed care of patient, patient resting on stretcher with eyes closed. RR even and unlabored. VSS

## 2019-03-06 ENCOUNTER — Inpatient Hospital Stay (HOSPITAL_COMMUNITY)
Admission: RE | Admit: 2019-03-06 | Discharge: 2019-03-15 | DRG: 057 | Disposition: A | Discharge status: Home / Self Care | Payer: Medicaid Other | Source: Intra-hospital | Attending: Physical Medicine & Rehabilitation | Admitting: Physical Medicine & Rehabilitation

## 2019-03-06 ENCOUNTER — Encounter (HOSPITAL_COMMUNITY): Payer: Self-pay | Admitting: Physical Medicine and Rehabilitation

## 2019-03-06 ENCOUNTER — Other Ambulatory Visit: Payer: Self-pay

## 2019-03-06 ENCOUNTER — Inpatient Hospital Stay (HOSPITAL_COMMUNITY): Payer: Medicaid Other

## 2019-03-06 DIAGNOSIS — R1909 Other intra-abdominal and pelvic swelling, mass and lump: Secondary | ICD-10-CM | POA: Diagnosis not present

## 2019-03-06 DIAGNOSIS — Z955 Presence of coronary angioplasty implant and graft: Secondary | ICD-10-CM

## 2019-03-06 DIAGNOSIS — E871 Hypo-osmolality and hyponatremia: Secondary | ICD-10-CM | POA: Diagnosis present

## 2019-03-06 DIAGNOSIS — I63511 Cerebral infarction due to unspecified occlusion or stenosis of right middle cerebral artery: Secondary | ICD-10-CM | POA: Diagnosis not present

## 2019-03-06 DIAGNOSIS — I639 Cerebral infarction, unspecified: Secondary | ICD-10-CM

## 2019-03-06 DIAGNOSIS — Z79899 Other long term (current) drug therapy: Secondary | ICD-10-CM

## 2019-03-06 DIAGNOSIS — I6389 Other cerebral infarction: Secondary | ICD-10-CM

## 2019-03-06 DIAGNOSIS — M7062 Trochanteric bursitis, left hip: Secondary | ICD-10-CM | POA: Diagnosis present

## 2019-03-06 DIAGNOSIS — D62 Acute posthemorrhagic anemia: Secondary | ICD-10-CM | POA: Diagnosis present

## 2019-03-06 DIAGNOSIS — I69354 Hemiplegia and hemiparesis following cerebral infarction affecting left non-dominant side: Secondary | ICD-10-CM | POA: Diagnosis not present

## 2019-03-06 DIAGNOSIS — I5042 Chronic combined systolic (congestive) and diastolic (congestive) heart failure: Secondary | ICD-10-CM | POA: Diagnosis present

## 2019-03-06 DIAGNOSIS — Z7982 Long term (current) use of aspirin: Secondary | ICD-10-CM | POA: Diagnosis not present

## 2019-03-06 DIAGNOSIS — G8194 Hemiplegia, unspecified affecting left nondominant side: Secondary | ICD-10-CM | POA: Diagnosis present

## 2019-03-06 DIAGNOSIS — F1410 Cocaine abuse, uncomplicated: Secondary | ICD-10-CM | POA: Diagnosis present

## 2019-03-06 DIAGNOSIS — R1110 Vomiting, unspecified: Secondary | ICD-10-CM

## 2019-03-06 DIAGNOSIS — K5901 Slow transit constipation: Secondary | ICD-10-CM | POA: Diagnosis not present

## 2019-03-06 DIAGNOSIS — I10 Essential (primary) hypertension: Secondary | ICD-10-CM | POA: Diagnosis present

## 2019-03-06 DIAGNOSIS — I110 Hypertensive heart disease with heart failure: Secondary | ICD-10-CM | POA: Diagnosis present

## 2019-03-06 DIAGNOSIS — I69392 Facial weakness following cerebral infarction: Secondary | ICD-10-CM | POA: Diagnosis not present

## 2019-03-06 DIAGNOSIS — E785 Hyperlipidemia, unspecified: Secondary | ICD-10-CM | POA: Diagnosis present

## 2019-03-06 DIAGNOSIS — Z951 Presence of aortocoronary bypass graft: Secondary | ICD-10-CM | POA: Diagnosis not present

## 2019-03-06 DIAGNOSIS — D179 Benign lipomatous neoplasm, unspecified: Secondary | ICD-10-CM | POA: Diagnosis not present

## 2019-03-06 DIAGNOSIS — I2510 Atherosclerotic heart disease of native coronary artery without angina pectoris: Secondary | ICD-10-CM | POA: Diagnosis present

## 2019-03-06 DIAGNOSIS — F17200 Nicotine dependence, unspecified, uncomplicated: Secondary | ICD-10-CM | POA: Diagnosis not present

## 2019-03-06 DIAGNOSIS — R531 Weakness: Secondary | ICD-10-CM | POA: Diagnosis present

## 2019-03-06 DIAGNOSIS — I255 Ischemic cardiomyopathy: Secondary | ICD-10-CM | POA: Diagnosis present

## 2019-03-06 DIAGNOSIS — I25810 Atherosclerosis of coronary artery bypass graft(s) without angina pectoris: Secondary | ICD-10-CM | POA: Diagnosis not present

## 2019-03-06 DIAGNOSIS — F17210 Nicotine dependence, cigarettes, uncomplicated: Secondary | ICD-10-CM | POA: Diagnosis present

## 2019-03-06 DIAGNOSIS — M79605 Pain in left leg: Secondary | ICD-10-CM

## 2019-03-06 LAB — MAGNESIUM: Magnesium: 1.9 mg/dL (ref 1.7–2.4)

## 2019-03-06 LAB — CBC
HCT: 31.3 % — ABNORMAL LOW (ref 39.0–52.0)
Hemoglobin: 10.4 g/dL — ABNORMAL LOW (ref 13.0–17.0)
MCH: 29.8 pg (ref 26.0–34.0)
MCHC: 33.2 g/dL (ref 30.0–36.0)
MCV: 89.7 fL (ref 80.0–100.0)
Platelets: 421 10*3/uL — ABNORMAL HIGH (ref 150–400)
RBC: 3.49 MIL/uL — ABNORMAL LOW (ref 4.22–5.81)
RDW: 13.4 % (ref 11.5–15.5)
WBC: 6.2 10*3/uL (ref 4.0–10.5)
nRBC: 0 % (ref 0.0–0.2)

## 2019-03-06 LAB — BASIC METABOLIC PANEL
Anion gap: 9 (ref 5–15)
BUN: 12 mg/dL (ref 8–23)
CO2: 22 mmol/L (ref 22–32)
Calcium: 8.4 mg/dL — ABNORMAL LOW (ref 8.9–10.3)
Chloride: 105 mmol/L (ref 98–111)
Creatinine, Ser: 0.82 mg/dL (ref 0.61–1.24)
GFR calc Af Amer: >60 mL/min (ref >60)
GFR calc non Af Amer: >60 mL/min (ref >60)
Glucose, Bld: 81 mg/dL (ref 70–99)
Potassium: 4 mmol/L (ref 3.5–5.1)
Sodium: 136 mmol/L (ref 135–145)

## 2019-03-06 LAB — ECHOCARDIOGRAM LIMITED: Height: 67 in

## 2019-03-06 MED ORDER — NICOTINE 21 MG/24HR TD PT24: 21.0000 mg | MEDICATED_PATCH | Freq: Every day | TRANSDERMAL | Status: DC

## 2019-03-06 MED ORDER — CLOPIDOGREL BISULFATE 75 MG PO TABS: 75.0000 mg | ORAL_TABLET | Freq: Every day | ORAL | Status: DC

## 2019-03-06 MED ORDER — ENOXAPARIN SODIUM 40 MG/0.4ML ~~LOC~~ SOLN
40.0000 mg | Freq: Every day | SUBCUTANEOUS | Status: DC
  Administered 2019-03-10 – 2019-03-15 (×6): 40 mg via SUBCUTANEOUS
  Filled 2019-03-06 (×8): qty 0.4

## 2019-03-06 MED ORDER — PNEUMOCOCCAL VAC POLYVALENT 25 MCG/0.5ML IJ INJ: 0.5000 mL | INJECTION | INTRAMUSCULAR | Status: DC

## 2019-03-06 MED ORDER — NICOTINE 21 MG/24HR TD PT24
21.0000 mg | MEDICATED_PATCH | Freq: Every day | TRANSDERMAL | Status: DC
  Administered 2019-03-07 – 2019-03-15 (×9): 21 mg via TRANSDERMAL
  Filled 2019-03-06 (×9): qty 1

## 2019-03-06 MED ORDER — ACETAMINOPHEN 325 MG PO TABS: 650.0000 mg | ORAL_TABLET | ORAL | Status: DC | PRN

## 2019-03-06 MED ORDER — SORBITOL 70 % SOLN
30.0000 mL | Freq: Every day | Status: DC | PRN
  Administered 2019-03-10 – 2019-03-14 (×2): 30 mL via ORAL
  Filled 2019-03-06 (×2): qty 30

## 2019-03-06 MED ORDER — ASPIRIN EC 81 MG PO TBEC: 81.0000 mg | DELAYED_RELEASE_TABLET | Freq: Every day | ORAL | Status: DC

## 2019-03-06 MED ORDER — ACETAMINOPHEN 650 MG RE SUPP: 650.0000 mg | RECTAL | Status: DC | PRN

## 2019-03-06 MED ORDER — INFLUENZA VAC SPLIT QUAD 0.5 ML IM SUSY: 0.5000 mL | PREFILLED_SYRINGE | INTRAMUSCULAR | Status: DC

## 2019-03-06 MED ORDER — ACETAMINOPHEN 325 MG PO TABS
650.0000 mg | ORAL_TABLET | ORAL | Status: DC | PRN
  Administered 2019-03-09 – 2019-03-12 (×8): 650 mg via ORAL
  Filled 2019-03-06 (×10): qty 2

## 2019-03-06 MED ORDER — ENOXAPARIN SODIUM 40 MG/0.4ML ~~LOC~~ SOLN: 40.0000 mg | Freq: Every day | SUBCUTANEOUS | Status: DC

## 2019-03-06 MED ORDER — SENNOSIDES-DOCUSATE SODIUM 8.6-50 MG PO TABS: 1.0000 | ORAL_TABLET | Freq: Every evening | ORAL | Status: DC | PRN

## 2019-03-06 MED ORDER — NICOTINE 21 MG/24HR TD PT24: 21.0000 mg | MEDICATED_PATCH | Freq: Every day | TRANSDERMAL | 0 refills | Status: DC

## 2019-03-06 MED ORDER — PERFLUTREN LIPID MICROSPHERE
1.0000 mL | INTRAVENOUS | Status: AC | PRN
  Administered 2019-03-06: 2 mL via INTRAVENOUS
  Filled 2019-03-06: qty 10

## 2019-03-06 MED ORDER — ATORVASTATIN CALCIUM 40 MG PO TABS
40.0000 mg | ORAL_TABLET | Freq: Every day | ORAL | Status: DC
  Administered 2019-03-06 – 2019-03-14 (×8): 40 mg via ORAL
  Filled 2019-03-06 (×8): qty 1

## 2019-03-06 MED ORDER — ENOXAPARIN SODIUM 40 MG/0.4ML ~~LOC~~ SOLN: 40.0000 mg | SUBCUTANEOUS | Status: DC

## 2019-03-06 MED ORDER — CLOPIDOGREL BISULFATE 75 MG PO TABS
75.0000 mg | ORAL_TABLET | Freq: Every day | ORAL | Status: DC
  Administered 2019-03-07 – 2019-03-15 (×9): 75 mg via ORAL
  Filled 2019-03-06 (×9): qty 1

## 2019-03-06 MED ORDER — ACETAMINOPHEN 160 MG/5ML PO SOLN: 650.0000 mg | ORAL | Status: DC | PRN

## 2019-03-06 MED ORDER — ASPIRIN EC 81 MG PO TBEC
81.0000 mg | DELAYED_RELEASE_TABLET | Freq: Every day | ORAL | Status: DC
  Administered 2019-03-07 – 2019-03-15 (×9): 81 mg via ORAL
  Filled 2019-03-06 (×9): qty 1

## 2019-03-06 NOTE — H&P (Signed)
Physical Medicine and Rehabilitation Admission H&P    No chief complaint on file. : HPI: [name] is a 63 year old right-handed male with history of polysubstance abuse including cocaine, CAD with stenting as well as CABG 2010 maintained on aspirin, ischemic cardiomyopathy ejection fraction of 35%, tobacco abuse and hypertension.  Per chart review he lives alone 1 level apartment.  Independent prior to admission.  He does have local family but no true contact with them.  Presented 03/04/2019 with left-sided weakness x2 days with noted falls due to weakness.  MRI showed multifocal acute ischemia within the right MCA territory.  No hemorrhage or mass-effect.  Admission chemistries unremarkable, hemoglobin 11.8, alcohol negative, SARS coronavirus negative, urinalysis negative nitrite, urine drug screen positive cocaine.  CT angiogram of head and neck occlusion of mid M1 segment of the right middle cerebral artery.  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  Echocardiogram with ejection fraction of 45% and global hypokinesis.  Neurology consulted maintain on aspirin and Plavix x3 weeks and aspirin alone.  Subcutaneous Lovenox for DVT prophylaxis.  Tolerating a regular diet.  Therapy evaluations completed and patient was admitted for a comprehensive rehab program.  Patient reports he doesn't associate with people in the house he lives in but has "other friends that could help".   Was (+) for cocaine during this admission.  Review of Systems  Constitutional: Negative for chills and fever.  HENT: Negative for hearing loss.   Eyes: Negative for blurred vision and double vision.  Respiratory: Negative for cough and shortness of breath.   Cardiovascular: Positive for leg swelling. Negative for chest pain and palpitations.  Gastrointestinal: Positive for constipation. Negative for heartburn and nausea.  Genitourinary: Negative  for dysuria, flank pain and hematuria.  Musculoskeletal: Positive for falls and myalgias.  Skin: Negative for rash.  All other systems reviewed and are negative.  Past Medical History:  Diagnosis Date  . CAD (coronary artery disease)    a. 04/2008 Cath: LM nl, LAD 50p, 36m, LCX min irregs, RCA 50p/m, 40d, RPDA 60-70ost; b. 04/2008 CABG x 4: LIMA->LAD, VG->D2, VG->RPDA->RPL.  . Cocaine abuse ([place])    a. Quit early 2019.  . Ischemic cardiomyopathy    a. LV gram: EF 30-35%, apical/periapical AK.  . Tobacco abuse    Past Surgical History:  Procedure Laterality Date  . BYPASS GRAFT    . CARDIAC SURGERY     No family history on file. Social History:  reports that he has been smoking cigarettes. He has been smoking about 0.30 packs per day. He uses smokeless tobacco. He reports that he does not drink alcohol or use drugs. Allergies: No Known Allergies Medications Prior to Admission  Medication Sig Dispense Refill  . acetaminophen (TYLENOL) 325 MG tablet Take 2 tablets (650 mg total) by mouth every 4 (four) hours as needed for mild pain (or temp > 37.5 C (99.5 F)).    Marland Kitchen aspirin EC 81 MG EC tablet Take 1 tablet (81 mg total) by mouth daily. (Patient not taking: Reported on 03/04/2019) 31 tablet 5  . atorvastatin (LIPITOR) 40 MG tablet Take 1 tablet (40 mg total) by mouth daily at 6 PM. (Patient not taking: Reported on 03/04/2019) 30 tablet 11  . [START ON 03/07/2019] clopidogrel (PLAVIX) 75 MG tablet Take 1 tablet (75 mg total) by mouth daily.    Derrill Memo ON 03/07/2019] enoxaparin (LOVENOX) 40 MG/0.4ML injection Inject 0.4 mLs (40 mg total)  into the skin daily. 0 mL   . [START ON 03/07/2019] nicotine (NICODERM CQ - DOSED IN MG/24 HOURS) 21 mg/24hr patch Place 1 patch (21 mg total) onto the skin daily. 28 patch 0  . senna-docusate (SENOKOT-S) 8.6-50 MG tablet Take 1 tablet by mouth at bedtime as needed for moderate constipation.      Drug Regimen Review Drug regimen was reviewed and remains  appropriate with no significant issues identified  Home:     Functional History:    Functional Status:  Mobility:          ADL:    Cognition:      Physical Exam: There were no vitals taken for this visit. Physical Exam  Constitutional:  Frail appearing older man who refused to move off R side- "trying to sleep" in bed while nurses were trying to get him moved to CIR, kept eyes closed most of time, NAD  HENT:  Head: Normocephalic and atraumatic.  L facial droop at rest- wouldn't smile or stick tongue out- said it was rude and doesn't like to smile. Facial sensation intact per pt.  Eyes: Pupils are equal, round, and reactive to light. EOM are normal.  EOMI B/L with no nystagmus Yellowish sclerae B/L with a lot of reddish blood vessels seen in sclerae  Neck: No tracheal deviation present.  Cardiovascular:  RRR, no M/R/G  Respiratory:  CTA B/L- no W/R/R  GI:  Soft, NT, ND however also said LBM 3+ days ago- also pt reports has "boils on abdomen" that are painful. Kept asking to eat "another tray" that he swears dietician server brought, however probably took his tray away, not brought a second one.  Musculoskeletal:     Cervical back: Normal range of motion and neck supple.     Comments: RUE and RLE 5/5- muscles checked- deltoid/biceps/triceps/WE/grip/finger ab/HF/KE/KF/DF/PF  LUE- deltoid and biceps 3/5, triceps 3-/5, WE 3-/5, grip 4-/5, finger 4-/5 LLE- HF 2/5, KE/KF 3/5, DF/PF 4/5   Neurological:  Patient is sitting up in chair.  He does make eye contact with examiner but prefers to keep his eyes closed.  Provides his name and age.  Follows simple commands.  He does have difficulty with subtracting as well as addition of simple problems.  Limited awareness of his deficits. Pt kept arguing had a 2nd lunch tray or dinner tray even though 2pm Mild L neglect- although said sensation intact on L side and face.  Skin:  Skin really dry on arms and legs- had peripheral IV  running IVFs Refused to show me "boils on stomach"  Psychiatric:  Sleepy but slightly agitated when asked to participate in exam    Results for orders placed or performed during the hospital encounter of 03/04/19 (from the past 48 hour(s))  Ethanol     Status: None   Collection Time: 03/04/19  3:52 PM  Result Value Ref Range   Alcohol, Ethyl (B) <10 <10 mg/dL    Comment: (NOTE) Lowest detectable limit for serum alcohol is 10 mg/dL. For medical purposes only. Performed at [facility], [place] [address] [place], [place] [address]   I-stat chem 8, ED     Status: Abnormal   Collection Time: 03/04/19  3:58 PM  Result Value Ref Range   Sodium 137 135 - 145 mmol/L   Potassium 4.0 3.5 - 5.1 mmol/L   Chloride 101 98 - 111 mmol/L   BUN 14 8 - 23 mg/dL   Creatinine, Ser 0.90 0.61 - 1.24 mg/dL  Glucose, Bld 119 (H) 70 - 99 mg/dL   Calcium, Ion 1.14 (L) 1.15 - 1.40 mmol/L   TCO2 26 22 - 32 mmol/L   Hemoglobin 12.6 (L) 13.0 - 17.0 g/dL   HCT 37.0 (L) 39.0 - 52.0 %  Urinalysis, Routine w reflex microscopic     Status: Abnormal   Collection Time: 03/04/19  4:58 PM  Result Value Ref Range   Color, Urine YELLOW YELLOW   APPearance CLEAR CLEAR   Specific Gravity, Urine 1.027 1.005 - 1.030   pH 6.0 5.0 - 8.0   Glucose, UA NEGATIVE NEGATIVE mg/dL   Hgb urine dipstick NEGATIVE NEGATIVE   Bilirubin Urine NEGATIVE NEGATIVE   Ketones, ur NEGATIVE NEGATIVE mg/dL   Protein, ur 30 (A) NEGATIVE mg/dL   Nitrite NEGATIVE NEGATIVE   Leukocytes,Ua NEGATIVE NEGATIVE   RBC / HPF 0-5 0 - 5 RBC/hpf   WBC, UA 0-5 0 - 5 WBC/hpf   Bacteria, UA NONE SEEN NONE SEEN   Squamous Epithelial / LPF 0-5 0 - 5   Mucus PRESENT     Comment: Performed at [facility], [place] [address], [place], [place] [address]  SARS CORONAVIRUS 2 (TAT 6-24 HRS) Nasopharyngeal Nasopharyngeal Swab     Status: None   Collection Time: 03/04/19 10:40 PM   Specimen: Nasopharyngeal Swab  Result Value Ref Range   SARS  Coronavirus 2 NEGATIVE NEGATIVE    Comment: (NOTE) SARS-CoV-2 target nucleic acids are NOT DETECTED. The SARS-CoV-2 RNA is generally detectable in upper and lower respiratory specimens during the acute phase of infection. Negative results do not preclude SARS-CoV-2 infection, do not rule out co-infections with other pathogens, and should not be used as the sole basis for treatment or other patient management decisions. Negative results must be combined with clinical observations, patient history, and epidemiological information. The expected result is Negative. Fact Sheet for Patients: SugarRoll.be Fact Sheet for Healthcare Providers: https://www.woods-mathews.com/ This test is not yet approved or cleared by the [place] FDA and  has been authorized for detection and/or diagnosis of SARS-CoV-2 by FDA under an Emergency Use Authorization (EUA). This EUA will remain  in effect (meaning this test can be used) for the duration of the COVID-19 declaration under Section 56 4(b)(1) of the Act, 21 U.S.C. section 360bbb-3(b)(1), unless the authorization is terminated or revoked sooner. Performed at [facility], [place] [address] [place], [place] [address]   Urine rapid drug screen (hosp performed)     Status: Abnormal   Collection Time: 03/05/19  1:33 AM  Result Value Ref Range   Opiates NONE DETECTED NONE DETECTED   Cocaine POSITIVE (A) NONE DETECTED   Benzodiazepines NONE DETECTED NONE DETECTED   Amphetamines NONE DETECTED NONE DETECTED   Tetrahydrocannabinol NONE DETECTED NONE DETECTED   Barbiturates NONE DETECTED NONE DETECTED    Comment: (NOTE) DRUG SCREEN FOR MEDICAL PURPOSES ONLY.  IF CONFIRMATION IS NEEDED FOR ANY PURPOSE, NOTIFY LAB WITHIN 5 DAYS. LOWEST DETECTABLE LIMITS FOR URINE DRUG SCREEN Drug Class                     Cutoff (ng/mL) Amphetamine and metabolites    1000 Barbiturate and metabolites    200 Benzodiazepine                  921 Tricyclics and metabolites     300 Opiates and metabolites        300 Cocaine and metabolites  300 THC                            50 Performed at [facility], [place] [address] [place], [place] [address]   Hemoglobin A1c     Status: Abnormal   Collection Time: 03/05/19  1:33 AM  Result Value Ref Range   Hgb A1c MFr Bld 6.3 (H) 4.8 - 5.6 %    Comment: (NOTE) Pre diabetes:          5.7%-6.4% Diabetes:              >6.4% Glycemic control for   <7.0% adults with diabetes    Mean Plasma Glucose 134.11 mg/dL    Comment: Performed at [facility] [address], [place], [place] [address]  CBC     Status: Abnormal   Collection Time: 03/05/19  1:33 AM  Result Value Ref Range   WBC 6.3 4.0 - 10.5 K/uL   RBC 3.51 (L) 4.22 - 5.81 MIL/uL   Hemoglobin 10.4 (L) 13.0 - 17.0 g/dL   HCT 32.2 (L) 39.0 - 52.0 %   MCV 91.7 80.0 - 100.0 fL   MCH 29.6 26.0 - 34.0 pg   MCHC 32.3 30.0 - 36.0 g/dL   RDW 13.7 11.5 - 15.5 %   Platelets 395 150 - 400 K/uL   nRBC 0.0 0.0 - 0.2 %    Comment: Performed at [facility], [place] [address] [place], [place] [address]  Creatinine, serum     Status: None   Collection Time: 03/05/19  1:33 AM  Result Value Ref Range   Creatinine, Ser 0.96 0.61 - 1.24 mg/dL   GFR calc non Af Amer >60 >60 mL/min   GFR calc Af Amer >60 >60 mL/min    Comment: Performed at [facility] [address], [place], [place] [address]  Lipid panel     Status: Abnormal   Collection Time: 03/05/19  4:43 AM  Result Value Ref Range   Cholesterol 184 0 - 200 mg/dL   Triglycerides 72 <150 mg/dL   HDL 34 (L) >40 mg/dL   Total CHOL/HDL Ratio 5.4 RATIO   VLDL 14 0 - 40 mg/dL   LDL Cholesterol 136 (H) 0 - 99 mg/dL    Comment:        Total Cholesterol/HDL:CHD Risk Coronary Heart Disease Risk Table                     Men   Women  1/2 Average Risk   3.4   3.3  Average Risk       5.0   4.4  2 X Average Risk   9.6   7.1  3 X Average Risk  23.4    11.0        Use the calculated Patient Ratio above and the CHD Risk Table to determine the patient's CHD Risk.        ATP III CLASSIFICATION (LDL):  <100     mg/dL   Optimal  100-129  mg/dL   Near or Above                    Optimal  130-159  mg/dL   Borderline  160-189  mg/dL   High  >190     mg/dL   Very High Performed at [facility] [address], [place], [place] [address]  CBC     Status: Abnormal   Collection Time: 03/05/19  4:43 AM  Result Value Ref Range   WBC 5.8 4.0 - 10.5 K/uL   RBC 3.65 (L) 4.22 - 5.81 MIL/uL   Hemoglobin 10.9 (L) 13.0 - 17.0 g/dL   HCT 33.3 (L) 39.0 - 52.0 %   MCV 91.2 80.0 - 100.0 fL   MCH 29.9 26.0 - 34.0 pg   MCHC 32.7 30.0 - 36.0 g/dL   RDW 13.6 11.5 - 15.5 %   Platelets 427 (H) 150 - 400 K/uL   nRBC 0.0 0.0 - 0.2 %    Comment: Performed at [facility] [address] [place], [place] [address]  Comprehensive metabolic panel     Status: Abnormal   Collection Time: 03/05/19  4:43 AM  Result Value Ref Range   Sodium 137 135 - 145 mmol/L   Potassium 3.9 3.5 - 5.1 mmol/L   Chloride 104 98 - 111 mmol/L   CO2 24 22 - 32 mmol/L   Glucose, Bld 84 70 - 99 mg/dL   BUN 15 8 - 23 mg/dL   Creatinine, Ser 0.92 0.61 - 1.24 mg/dL   Calcium 8.3 (L) 8.9 - 10.3 mg/dL   Total Protein 6.3 (L) 6.5 - 8.1 g/dL   Albumin 2.4 (L) 3.5 - 5.0 g/dL   AST 11 (L) 15 - 41 U/L   ALT 8 0 - 44 U/L   Alkaline Phosphatase 54 38 - 126 U/L   Total Bilirubin 0.6 0.3 - 1.2 mg/dL   GFR calc non Af Amer >60 >60 mL/min   GFR calc Af Amer >60 >60 mL/min   Anion gap 9 5 - 15    Comment: Performed at [facility] [address] [place], [place] [address]  Basic metabolic panel     Status: Abnormal   Collection Time: 03/06/19  4:25 AM  Result Value Ref Range   Sodium 136 135 - 145 mmol/L   Potassium 4.0 3.5 - 5.1 mmol/L   Chloride 105 98 - 111 mmol/L   CO2 22 22 - 32 mmol/L   Glucose, Bld 81 70 - 99 mg/dL   BUN 12 8 - 23 mg/dL   Creatinine, Ser  0.82 0.61 - 1.24 mg/dL   Calcium 8.4 (L) 8.9 - 10.3 mg/dL   GFR calc non Af Amer >60 >60 mL/min   GFR calc Af Amer >60 >60 mL/min   Anion gap 9 5 - 15    Comment: Performed at [facility] [address], [place], [place] [address]  CBC     Status: Abnormal   Collection Time: 03/06/19  4:25 AM  Result Value Ref Range   WBC 6.2 4.0 - 10.5 K/uL   RBC 3.49 (L) 4.22 - 5.81 MIL/uL   Hemoglobin 10.4 (L) 13.0 - 17.0 g/dL   HCT 31.3 (L) 39.0 - 52.0 %   MCV 89.7 80.0 - 100.0 fL   MCH 29.8 26.0 - 34.0 pg   MCHC 33.2 30.0 - 36.0 g/dL   RDW 13.4 11.5 - 15.5 %   Platelets 421 (H) 150 - 400 K/uL   nRBC 0.0 0.0 - 0.2 %    Comment: Performed at [facility], [place] [address] [place],  [address]  Magnesium     Status: None   Collection Time: 03/06/19  4:25 AM  Result Value Ref Range   Magnesium 1.9 1.7 - 2.4 mg/dL    Comment: Performed  at [facility], [place] [address] [place],  [address]   CT ANGIO HEAD W OR WO CONTRAST  Result Date: 03/05/2019 CLINICAL DATA:  Stroke follow-up EXAM: CT ANGIOGRAPHY HEAD AND NECK TECHNIQUE: Multidetector CT imaging of the head and neck was performed using the standard protocol during bolus administration of intravenous contrast. Multiplanar CT image reconstructions and MIPs were obtained to evaluate the vascular anatomy. Carotid stenosis measurements (when applicable) are obtained utilizing NASCET criteria, using the distal internal carotid diameter as the denominator. CONTRAST:  17mL OMNIPAQUE IOHEXOL 350 MG/ML SOLN COMPARISON:  None. FINDINGS: CTA NECK FINDINGS SKELETON: There is no bony spinal canal stenosis. No lytic or blastic lesion. OTHER NECK: Normal pharynx, larynx and major salivary glands. No cervical lymphadenopathy. Unremarkable thyroid gland. UPPER CHEST: No pneumothorax or pleural effusion. No nodules or masses. AORTIC ARCH: There is no calcific atherosclerosis of the aortic arch. There is no aneurysm, dissection or  hemodynamically significant stenosis of the visualized portion of the aorta. Conventional 3 vessel aortic branching pattern. The visualized proximal subclavian arteries are widely patent. RIGHT CAROTID SYSTEM: No dissection, occlusion or aneurysm. Mild atherosclerotic calcification at the carotid bifurcation without hemodynamically significant stenosis. LEFT CAROTID SYSTEM: Normal without aneurysm, dissection or stenosis. VERTEBRAL ARTERIES: Left dominant configuration. Both origins are clearly patent. There is no dissection, occlusion or flow-limiting stenosis to the skull base (V1-V3 segments). CTA HEAD FINDINGS POSTERIOR CIRCULATION: --Vertebral arteries: Normal V4 segments. --Posterior inferior cerebellar arteries (PICA): Patent origins from the vertebral arteries. --Anterior inferior cerebellar arteries (AICA): Patent origins from the basilar artery. --Basilar artery: Normal. --Superior cerebellar arteries: Normal. --Posterior cerebral arteries: Normal. Both originate from the basilar artery. Posterior communicating arteries (p-comm) are diminutive or absent. ANTERIOR CIRCULATION: --Intracranial internal carotid arteries: There are severe stenoses of the cavernous segments of both internal carotid arteries due to calcific atherosclerosis. --Anterior cerebral arteries (ACA): Normal. Both A1 segments are present. Patent anterior communicating artery (a-comm). --Middle cerebral arteries (MCA): There is occlusion of the mid M1 segment of the right middle cerebral artery. The M2 branches are patent. VENOUS SINUSES: As permitted by contrast timing, patent. ANATOMIC VARIANTS: None Review of the MIP images confirms the above findings. IMPRESSION: 1. 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 2. 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 These results were communicated to Dr. Roland Rack at 12:31 am on 03/05/2019 by  text page via the [facility] messaging system. Electronically Signed   By: Ulyses Jarred M.D.   On: 03/05/2019 00:33   CT ANGIO NECK W OR WO CONTRAST  Result Date: 03/05/2019 CLINICAL DATA:  Stroke follow-up EXAM: CT ANGIOGRAPHY HEAD AND NECK TECHNIQUE: Multidetector CT imaging of the head and neck was performed using the standard protocol during bolus administration of intravenous contrast. Multiplanar CT image reconstructions and MIPs were obtained to evaluate the vascular anatomy. Carotid stenosis measurements (when applicable) are obtained utilizing NASCET criteria, using the distal internal carotid diameter as the denominator. CONTRAST:  24mL OMNIPAQUE IOHEXOL 350 MG/ML SOLN COMPARISON:  None. FINDINGS: CTA NECK FINDINGS SKELETON: There is no bony spinal canal stenosis. No lytic or blastic lesion. OTHER NECK: Normal pharynx, larynx and major salivary glands. No cervical lymphadenopathy. Unremarkable thyroid gland. UPPER CHEST: No pneumothorax or pleural effusion. No nodules or masses. AORTIC ARCH: There is no calcific atherosclerosis of the aortic arch. There is no aneurysm, dissection or hemodynamically significant stenosis of the visualized portion of the aorta. Conventional  3 vessel aortic branching pattern. The visualized proximal subclavian arteries are widely patent. RIGHT CAROTID SYSTEM: No dissection, occlusion or aneurysm. Mild atherosclerotic calcification at the carotid bifurcation without hemodynamically significant stenosis. LEFT CAROTID SYSTEM: Normal without aneurysm, dissection or stenosis. VERTEBRAL ARTERIES: Left dominant configuration. Both origins are clearly patent. There is no dissection, occlusion or flow-limiting stenosis to the skull base (V1-V3 segments). CTA HEAD FINDINGS POSTERIOR CIRCULATION: --Vertebral arteries: Normal V4 segments. --Posterior inferior cerebellar arteries (PICA): Patent origins from the vertebral arteries. --Anterior inferior cerebellar arteries (AICA): Patent  origins from the basilar artery. --Basilar artery: Normal. --Superior cerebellar arteries: Normal. --Posterior cerebral arteries: Normal. Both originate from the basilar artery. Posterior communicating arteries (p-comm) are diminutive or absent. ANTERIOR CIRCULATION: --Intracranial internal carotid arteries: There are severe stenoses of the cavernous segments of both internal carotid arteries due to calcific atherosclerosis. --Anterior cerebral arteries (ACA): Normal. Both A1 segments are present. Patent anterior communicating artery (a-comm). --Middle cerebral arteries (MCA): There is occlusion of the mid M1 segment of the right middle cerebral artery. The M2 branches are patent. VENOUS SINUSES: As permitted by contrast timing, patent. ANATOMIC VARIANTS: None Review of the MIP images confirms the above findings. IMPRESSION: 1. 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 2. 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 These results were communicated to Dr. Roland Rack at 12:31 am on 03/05/2019 by text page via the [facility] messaging system. Electronically Signed   By: Ulyses Jarred M.D.   On: 03/05/2019 00:33   MR Brain Wo Contrast (neuro protocol)  Result Date: 03/04/2019 CLINICAL DATA:  Ataxia EXAM: MRI HEAD WITHOUT CONTRAST TECHNIQUE: Multiplanar, multiecho pulse sequences of the brain and surrounding structures were obtained without intravenous contrast. COMPARISON:  None. FINDINGS: Brain: There is multifocal acute ischemia within the right hemisphere, scattered within the right MCA territory with the largest lesions in the right frontal lobe and anterior right temporal lobe. No acute hemorrhage. No midline shift or other mass effect. Brain volume is normal. No hydrocephalus. There is mild cytotoxic edema associated with the ischemic sites but the brain parenchyma is otherwise normal. No chronic  microhemorrhage. The midline structures are normal. Vascular: Flow voids are preserved. Skull and upper cervical spine: Normal bone marrow signal. Visualized extracranial soft tissues are normal. Sinuses/Orbits: Orbits are normal. No mastoid or middle ear effusion. Paranasal sinuses are clear. Other: None IMPRESSION: Multifocal acute ischemia within the right MCA territory. No hemorrhage or mass effect. Electronically Signed   By: Ulyses Jarred M.D.   On: 03/04/2019 19:11   CT CEREBRAL PERFUSION W CONTRAST  Result Date: 03/05/2019 CLINICAL DATA:  Stroke follow-up. History of cocaine abuse. EXAM: CT PERFUSION BRAIN TECHNIQUE: Multiphase CT imaging of the brain was performed following IV bolus contrast injection. Subsequent parametric perfusion maps were calculated using RAPID software. CONTRAST:  82mL OMNIPAQUE IOHEXOL 350 MG/ML SOLN COMPARISON:  None. FINDINGS: CT Brain Perfusion Findings: CBF (<30%) Volume: 51mL Perfusion (Tmax>6.0s) volume: 7mL. T-max is elevated throughout much of the right MCA territory. Using a Tmax threshold of 4 seconds instead of 6 seconds demonstrates a larger region of ischemia measuring 100 mL. Mismatch Volume: 37mL Infarct Core: 0 mL Infarction Location: CT perfusion parameters do not demonstrated core infarct. However, earlier brain MRI shows abnormal diffusion restriction at multiple locations in the right MCA territory. No recent non-contrast head CT is available for determining ASPECTS. IMPRESSION: 1. No core infarct identified by CT perfusion parameters.  However, earlier brain MRI shows abnormal diffusion restriction at multiple locations in the right MCA territory. 2. 7 mL region within the right frontal operculum meeting parameters for ischemic penumbra. Using a Tmax threshold of 4 seconds instead of 6 seconds demonstrates a larger region of ischemia measuring 100 mL. Electronically Signed   By: Ulyses Jarred M.D.   On: 03/05/2019 01:47   ECHOCARDIOGRAM COMPLETE  Result  Date: 03/05/2019   ECHOCARDIOGRAM REPORT   Patient Name:   [name] Date of Exam: 03/05/2019 Medical Rec #:  [id number]       Height:       67.0 in Accession #:    [id number]      Weight:       119.1 lb Date of Birth:  [birth year]       BSA:          1.62 m Patient Age:    66 years        BP:           106/72 mmHg Patient Gender: M               HR:           75 bpm. Exam Location:  Inpatient Procedure: 2D Echo Indications:    Stroke 434.91 / I163.9  History:        Patient has prior history of Echocardiogram examinations, most                 recent 10/17/2017. CAD; Risk Factors:Current Smoker and                 Dyslipidemia. Polysubstance Abuse.  Sonographer:    Leavy Cella Referring Phys: [place] MOHAMMAD L GARBA IMPRESSIONS  1. EF 40-45% with global hypokinesis. There is akinesis of the apical septum, apical inferior, and apical segments. There is no obvious thrombus present in the LV, but would recommend a repeat contrast study to exclude apical thrombus in the patient with concerns for stroke.  2. Left ventricular ejection fraction, by visual estimation, is 40 to 45%. The left ventricle has mildly decreased function. There is no left ventricular hypertrophy.  3. Apical septal segment, apical inferior segment, and apex are abnormal.  4. The left ventricle demonstrates regional wall motion abnormalities.  5. Global right ventricle has normal systolic function.The right ventricular size is normal. No increase in right ventricular wall thickness.  6. Left atrial size was normal.  7. Right atrial size was normal.  8. The mitral valve is myxomatous. Trivial mitral valve regurgitation.  9. The tricuspid valve is grossly normal. Tricuspid valve regurgitation is trivial. 10. The aortic valve is tricuspid. Aortic valve regurgitation is not visualized. No evidence of aortic valve sclerosis or stenosis. 11. The pulmonic valve was grossly normal. Pulmonic valve regurgitation is not visualized. 12. TR signal is  inadequate for assessing pulmonary artery systolic pressure. 13. The inferior vena cava is normal in size with greater than 50% respiratory variability, suggesting right atrial pressure of 3 mmHg. 14. A prior study was performed on 10/17/2017. 15. Changes from prior study are noted. 16. EF slightly improved ~45% compared with prior. WMA remain unchanged. FINDINGS  Left Ventricle: Left ventricular ejection fraction, by visual estimation, is 40 to 45%. The left ventricle has mildly decreased function. The left ventricle demonstrates regional wall motion abnormalities. The left ventricular internal cavity size was the left ventricle is normal in size. There is no left ventricular hypertrophy. EF 40-45% with global hypokinesis. There  is akinesis of the apical septum, apical inferior, and apical segments. There is no obvious thrombus present in the LV, but would recommend a repeat contrast study to exclude apical thrombus in the patient with concerns for stroke.  LV Wall Scoring: The apical septal segment, apical inferior segment, and apex are akinetic. Right Ventricle: The right ventricular size is normal. No increase in right ventricular wall thickness. Global RV systolic function is has normal systolic function. Left Atrium: Left atrial size was normal in size. Right Atrium: Right atrial size was normal in size Pericardium: There is no evidence of pericardial effusion. 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 Tricuspid Valve: The tricuspid valve is grossly normal. Tricuspid valve regurgitation is trivial. Aortic Valve: The aortic valve is tricuspid. Aortic valve regurgitation is not visualized. The aortic valve is structurally normal, with no evidence of sclerosis or stenosis. Pulmonic Valve: The pulmonic valve was grossly normal. Pulmonic valve regurgitation is not visualized. Pulmonic regurgitation is not visualized. Aorta: The aortic root is normal in size and structure.  Venous: The inferior vena cava is normal in size with greater than 50% respiratory variability, suggesting right atrial pressure of 3 mmHg. IAS/Shunts: No atrial level shunt detected by color flow Doppler. Additional Comments: A prior study was performed on 10/17/2017.  LEFT VENTRICLE PLAX 2D LVIDd:         4.50 cm  Diastology LVIDs:         3.40 cm  LV e' lateral:   8.95 cm/s LV PW:         1.20 cm  LV E/e' lateral: 6.4 LV IVS:        1.10 cm  LV e' medial:    7.09 cm/s LVOT diam:     2.20 cm  LV E/e' medial:  8.0 LV SV:         45 ml LV SV Index:   28.32 LVOT Area:     3.80 cm  RIGHT VENTRICLE RV S prime:     14.40 cm/s TAPSE (M-mode): 1.5 cm LEFT ATRIUM           Index       RIGHT ATRIUM           Index LA diam:      3.20 cm 1.97 cm/m  RA Area:     11.40 cm LA Vol (A2C): 31.0 ml 19.11 ml/m RA Volume:   24.90 ml  15.35 ml/m LA Vol (A4C): 44.3 ml 27.30 ml/m   AORTA Ao Root diam: 3.50 cm MITRAL VALVE MV Area (PHT): 5.42 cm             SHUNTS MV PHT:        40.60 msec           Systemic Diam: 2.20 cm MV Decel Time: 140 msec MV E velocity: 57.00 cm/s 103 cm/s MV A velocity: 91.70 cm/s 70.3 cm/s MV E/A ratio:  0.62       1.5  Eleonore Chiquito MD Electronically signed by Eleonore Chiquito MD Signature Date/Time: 03/05/2019/2:53:46 PM    Final    ECHOCARDIOGRAM LIMITED  Result Date: 03/06/2019   ECHOCARDIOGRAM LIMITED REPORT   Patient Name:   [name] Date of Exam: 03/06/2019 Medical Rec #:  [id number]       Height:       67.0 in Accession #:    [id number]      Weight:       119.1 lb Date of  Birth:  03-16-1956       BSA:          1.62 m Patient Age:    84 years        BP:           111/79 mmHg Patient Gender: M               HR:           74 bpm. Exam Location:  Inpatient  Procedure: Limited Echo and Intracardiac Opacification Agent Indications:    Stroke 434.91 / I163.9  History:        Patient has prior history of Echocardiogram examinations, most                 recent 03/05/2019. Cocaine abuse.  Sonographer:     Tiffany Dance Referring Phys: [id number] [place]  1. Left ventricular ejection fraction, by visual estimation, is 35 to 40%. The left ventricle has moderately decreased function. There is no left ventricular hypertrophy.  2. The left ventricle demonstrates regional wall motion abnormalities.  3. Global right ventricle has normal systolic function.The right ventricular size is normal.  4. Left atrial size was not assessed.  5. Right atrial size was normal.  6. Moderate pleural effusion.  7. The mitral valve was not assessed.  8. The tricuspid valve is not assessed.  9. The aortic valve was not assessed. 10. The pulmonic valve was not assessed. 11. Aortic root could not be assessed. 12. Limited study; no doppler performed; apical akinesis with moderate LV dysfunction; using definity, no obvious thrombus noted. FINDINGS  Left Ventricle: Left ventricular ejection fraction, by visual estimation, is 35 to 40%. The left ventricle has moderately decreased function. The left ventricle demonstrates regional wall motion abnormalities. Right Ventricle: The right ventricular size is normal. Global RV systolic function is has normal systolic function. Left Atrium: Left atrial size was not assessed. Right Atrium: Right atrial size was normal in size Pericardium: There is no evidence of pericardial effusion. There is a moderate pleural effusion in either the left or right lateral region. Mitral Valve: The mitral valve was not assessed. Tricuspid Valve: The tricuspid valve is not assessed. Aortic Valve: The aortic valve was not assessed. Pulmonic Valve: The pulmonic valve was not assessed. Aorta: Aortic root could not be assessed.  Additional Comments: Limited study; no doppler performed; apical akinesis with moderate LV dysfunction; using definity, no obvious thrombus noted.  Kirk Ruths MD Electronically signed by Kirk Ruths MD Signature Date/Time: 03/06/2019/3:22:00 PM    Final        Medical  Problem List and Plan: 1.  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  Antithrombotics: -DVT/anticoagulation: Lovenox  -antiplatelet therapy: Aspirin 81 mg daily and Plavix 75 mg daily x3 weeks then aspirin alone 3. Pain Management: Tylenol as needed 4. Mood: Provide emotional support  -antipsychotic agents: N/A 5. Neuropsych: This patient is? capable of making decisions on his own behalf due to impaired memory and problem solving per OT/PT- has no one else to make decisions- will try to address while in CIR. 6. Skin/Wound Care: Routine skin checks  - boils vs hematomas from Lovenox? Will assess when pt allows 7. Fluids/Electrolytes/Nutrition: Routine in and outs with follow-up chemistries 8.  Permissive hypertension.  Monitor with increased mobility.  Patient on lisinopril  2.5 mg daily prior to admission.  Resume as needed 9.  History of polysubstance abuse as well as tobacco abuse.  Urine drug screen positive cocaine.  Counseling 10.  Hyperlipidemia.  Lipitor 11. 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    Silvestre Mesi, PA   I have personally performed a face to face diagnostic evaluation of this patient and formulated the key components of the plan.  Additionally, I have personally reviewed laboratory data, imaging studies, as well as relevant notes and concur with the physician assistant's documentation above.   The patient's status has not changed from the original H&P.  Any changes in documentation from the acute care chart have been noted above.    Courtney Heys, MD 03/06/2019

## 2019-03-06 NOTE — Progress Notes (Addendum)
Inpatient Rehab Admissions:  Inpatient Rehab Consult received.  I met with patient at the bedside for rehabilitation assessment and to discuss goals and expectations of an inpatient rehab admission.  He is open to CIR, says that he lives with a landlord and several other people who can help him during the day, if needed.  I spoke to his landlord to confirm this.  Awaiting word from Dr. Louanne Belton is pt is ready to admit to CIR today.   Addendum: Dr. Louanne Belton agrees for pt to admit to CIR today. I will let pt and CSW know.   Signed: Shann Medal, PT, DPT Admissions Coordinator [phone] 03/06/19  11:16 AM

## 2019-03-06 NOTE — PMR Pre-admission (Signed)
PMR Admission Coordinator Pre-Admission Assessment  Patient: [name] is an 63 y.o., male MRN: [id number] DOB: 27-Oct-1955 Height: '5\' 7"'  (170.2 cm) Weight:    Insurance Information HMO:     PPO:      PCP:      IPA:      80/20:      OTHER:  PRIMARY: Medicaid [place] [facility]      Policy#: [id number]      Subscriber: patient CM Name:       Phone#:      Fax#:  Pre-Cert#: verified on passport onesource      Employer:  Benefits:  Phone #:      Name:  Eff. Date: active as of 03/06/19, coverage code [id number]     Deduct:       Out of Pocket Max:       Life Max:  CIR: 100%      SNF: 100% Outpatient:      Co-Pay:  Home Health:       Co-Pay:  DME:      Co-Pay:  Providers:  SECONDARY:       Policy#:       Subscriber:  CM Name:       Phone#:      Fax#:  Pre-Cert#:       Employer:  Benefits:  Phone #:      Name:  Eff. Date:      Deduct:       Out of Pocket Max:       Life Max:  CIR:       SNF:  Outpatient:      Co-Pay:  Home Health:       Co-Pay:  DME:      Co-Pay:   Medicaid Application Date:       Case Manager:  Disability Application Date:       Case Worker:   The "Data Collection Information Summary" for patients in Inpatient Rehabilitation Facilities with attached "Privacy Act [place] Records" was provided and verbally reviewed with: N/A  Emergency Contact Information Contact Information    Name Relation Home Work Kellogg Friend   [phone]      Current Medical History  Patient Admitting Diagnosis: R MCA  History of Present Illness: [name] is a 63 year old right-handed male with history of polysubstance abuse including cocaine, CAD with stenting as well as CABG 2010 maintained on aspirin, ischemic cardiomyopathy ejection fraction of 35%, tobacco abuse and hypertension.  Presented 03/04/2019 with left-sided weakness x2 days with noted falls due to weakness.  MRI showed multifocal acute ischemia within the right MCA territory.  No  hemorrhage or mass-effect.  Admission chemistries unremarkable, hemoglobin 11.8, alcohol negative, SARS coronavirus negative, urinalysis negative nitrite, urine drug screen positive cocaine.  CT angiogram of head and neck occlusion of mid M1 segment of the right middle cerebral artery.  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  Echocardiogram with ejection fraction of 45% and global hypokinesis.  Neurology consulted maintain on aspirin and Plavix x3 weeks and aspirin alone.  Subcutaneous Lovenox for DVT prophylaxis.  Tolerating a regular diet.  Therapy evaluations completed and patient was recommended for a comprehensive rehab program.  Complete NIHSS TOTAL: 4  Patient's medical record from [facility] has been reviewed by the rehabilitation admission coordinator and physician.  Past Medical History  Past Medical History:  Diagnosis Date  . CAD (coronary artery disease)  a. 04/2008 Cath: LM nl, LAD 50p, 67m LCX min irregs, RCA 50p/m, 40d, RPDA 60-70ost; b. 04/2008 CABG x 4: LIMA->LAD, VG->D2, VG->RPDA->RPL.  . [place]    a. Quit early 2019.  . Ischemic cardiomyopathy    a. LV gram: EF 30-35%, apical/periapical AK.  . Tobacco abuse     Family History   family history is not on file.  Prior Rehab/Hospitalizations Has the patient had prior rehab or hospitalizations prior to admission? No  Has the patient had major surgery during 100 days prior to admission? No   Current Medications  Current Facility-Administered Medications:  .  0.9 %  sodium chloride infusion, , Intravenous, Continuous, Pokhrel, Laxman, MD, Last Rate: 50 mL/hr at 03/06/19 0736, New Bag at 03/06/19 0736 .  acetaminophen (TYLENOL) tablet 650 mg, 650 mg, Oral, Q4H PRN, 650 mg at 03/06/19 0054 **OR** acetaminophen (TYLENOL) 160 MG/5ML solution 650 mg, 650 mg, Per Tube, Q4H PRN **OR** acetaminophen (TYLENOL) suppository 650 mg, 650 mg,  Rectal, Q4H PRN, GJonelle Sidle Mohammad L, MD .  aspirin EC tablet 81 mg, 81 mg, Oral, Daily, Biby, Sharon L, NP, 81 mg at 03/06/19 1036 .  atorvastatin (LIPITOR) tablet 40 mg, 40 mg, Oral, q1800, Biby, Sharon L, NP, 40 mg at 03/05/19 1806 .  clopidogrel (PLAVIX) tablet 75 mg, 75 mg, Oral, Daily, Biby, Sharon L, NP, 75 mg at 03/06/19 1036 .  enoxaparin (LOVENOX) injection 40 mg, 40 mg, Subcutaneous, Daily, GJonelle Sidle Mohammad L, MD, 40 mg at 03/06/19 1036 .  nicotine (NICODERM CQ - dosed in mg/24 hours) patch 21 mg, 21 mg, Transdermal, Daily, GJonelle Sidle Mohammad L, MD, 21 mg at 03/06/19 1039 .  senna-docusate (Senokot-S) tablet 1 tablet, 1 tablet, Oral, QHS PRN, GElwyn Reach MD  Patients Current Diet:  Diet Order            Diet Heart Room service appropriate? Yes; Fluid consistency: Thin  Diet effective now              Precautions / Restrictions Precautions Precautions: Fall Precaution Comments: L weakness Restrictions Weight Bearing Restrictions: No   Has the patient had 2 or more falls or a fall with injury in the past year? No  Prior Activity Level Community (5-7x/wk): worked part time in concrete, no DME used prior to admission, polysubstance abuse  Prior Functional Level Self Care: Did the patient need help bathing, dressing, using the toilet or eating? Independent  Indoor Mobility: Did the patient need assistance with walking from room to room (with or without device)? Independent  Stairs: Did the patient need assistance with internal or external stairs (with or without device)? Independent  Functional Cognition: Did the patient need help planning regular tasks such as shopping or remembering to take medications? Independent  Home Assistive Devices / Equipment Home Equipment: None  Prior Device Use: Indicate devices/aids used by the patient prior to current illness, exacerbation or injury? None of the above  Current Functional Level Cognition  Overall Cognitive Status:  Impaired/Different from baseline Current Attention Level: Sustained Orientation Level: Oriented X4 Safety/Judgement: Decreased awareness of safety, Decreased awareness of deficits General Comments: some decreased L awareness    Extremity Assessment (includes Sensation/Coordination)  Upper Extremity Assessment: LUE deficits/detail LUE Deficits / Details: AROM WFL  Strength: Shoulder 4-/5, biceps and triceps 3+/5, grip 3/5, finger extension 3/5 LUE Coordination: decreased fine motor, decreased gross motor  Lower Extremity Assessment: Defer to PT evaluation LLE Deficits / Details: AROM WFL, strength hip flexion 3-/5, knee extension  4/5    ADLs  Overall ADL's : Needs assistance/impaired Eating/Feeding: Set up, Sitting Eating/Feeding Details (indicate cue type and reason): requires assist to cut food Grooming: Wash/dry hands, Wash/dry face, Oral care, Minimal assistance, Sitting, Cueing for compensatory techniques Grooming Details (indicate cue type and reason): Pt can groom sitting in chair but requires cues to use his LUE as an assist.  LUE moves fairly well but pt neglects to use if functionally. Upper Body Bathing: Minimal assistance, Sitting, Cueing for compensatory techniques Upper Body Bathing Details (indicate cue type and reason): cues to attempt to use LUE to bathe RUE. Lower Body Bathing: Moderate assistance, Sit to/from stand, Cueing for compensatory techniques Lower Body Bathing Details (indicate cue type and reason): Pt requires cues to cross legs to wash feet instead of reaching to feet due to poor balance.  Pt requires mod assist when in standing and doing functional task in standing.   Upper Body Dressing : Moderate assistance, Sitting, Cueing for compensatory techniques Upper Body Dressing Details (indicate cue type and reason): Once cues given to dress L side first, pt could donn gown with min assist.  Pt forgot cues given to dress L side first after 5 minute delay when asked  to dress self again. Lower Body Dressing: Maximal assistance, Sit to/from stand, Cueing for compensatory techniques, Cueing for safety Lower Body Dressing Details (indicate cue type and reason): Pt fatigued and resistant to dressing LE therefore may have taken more assist than necessary.  Pt doffed socks but was uable to donn due to fatigue. Toilet Transfer: Minimal assistance, Stand-pivot, BSC, RW Toilet Transfer Details (indicate cue type and reason): Pt requires cues with use of walker.  First grasping with L hand but eventually not gripping L side of walker with L hand. Toileting- Clothing Manipulation and Hygiene: Moderate assistance, Sit to/from stand, Cueing for compensatory techniques Toileting - Clothing Manipulation Details (indicate cue type and reason): Pt stood at walker while therapist assited to clean pt. Functional mobility during ADLs: Minimal assistance, Rolling walker General ADL Comments: Pt required assist with techniques with LUE being weak and requires more assist when on his feet due to safety issued.  L neglect is mild but present which presents a safety risk.     Mobility  Overal bed mobility: Needs Assistance Bed Mobility: Supine to Sit Supine to sit: Min assist, HOB elevated General bed mobility comments: assist to get sqared up on side of bed but did get to sitting without physical assist.    Transfers  Overall transfer level: Needs assistance Equipment used: Rolling walker (2 wheeled), 1 person hand held assist Transfers: Sit to/from Stand, Stand Pivot Transfers Sit to Stand: Min assist Stand pivot transfers: Mod assist General transfer comment: Pt unsteady when on his feet neglecting LLE while moving at times    Ambulation / Gait / Stairs / Wheelchair Mobility  Ambulation/Gait Ambulation/Gait assistance: Herbalist (Feet): 90 Feet Assistive device: Standard walker Gait Pattern/deviations: Step-to pattern, Step-through pattern, Decreased step  length - left, Decreased stance time - left General Gait Details: difficulty with using SW (only one available in ED), then needing assist for balance, cues to prevent anterior LOB    Posture / Balance Balance Overall balance assessment: Needs assistance Sitting-balance support: Feet supported Sitting balance-Leahy Scale: Fair Standing balance support: Bilateral upper extremity supported, During functional activity Standing balance-Leahy Scale: Poor Standing balance comment: must have outside support when on his feet.    Special needs/care consideration BiPAP/CPAP  no CPM  no Continuous Drip IV  no Dialysis  no        Days n/a Life Vest no Oxygen no Special Bed no Trach Size no Wound Vac (area) no      Location n/a Skin intact                             Location n/a Bowel mgmt: continent Bladder mgmt: continent Diabetic mgmt: no Behavioral consideration polysubstance abuse at baseline Chemo/radiation    Previous Home Environment (from acute therapy documentation) Living Arrangements: Alone Available Help at Discharge: Available PRN/intermittently, Friend(s) Type of Home: House Home Layout: One level Home Access: Stairs to enter [employer] of Steps: 2 Bathroom Shower/Tub: Tub/shower unit, Door [employer]: Standard Additional Comments: does not drive.  Gets ride to work  Discharge Living Setting Plans for Discharge Living Setting: Patient's home, Lives with (comment)(boarding home) Type of Home at Discharge: Other (Comment) Discharge Home Layout: Able to live on main level with bedroom/bathroom Discharge Home Access: Stairs to enter Entrance Stairs-Rails: None Entrance Stairs-Number of Steps: 1-2 Discharge Bathroom Shower/Tub: Tub/shower unit Discharge Bathroom Toilet: Standard Discharge Bathroom Accessibility: Yes How Accessible: Accessible via walker Does the patient have any problems obtaining your medications?: No  Social/Family/Support  Systems Anticipated Caregiver: Mercer Pod (landlord) Anticipated Caregiver's Contact Information: [phone] Ability/Limitations of Caregiver: states there are several people living at the house who are able to provide help if needed Caregiver Availability: 24/7 Discharge Plan Discussed with Primary Caregiver: Yes Is Caregiver In Agreement with Plan?: Yes Does Caregiver/Family have Issues with Lodging/Transportation while Pt is in Rehab?: No  Goals/Additional Needs Patient/Family Goal for Rehab: PT/OT/SLP supervision to mod I Expected length of stay: 9-12 days Dietary Needs: heart/thin Pt/Family Agrees to Admission and willing to participate: Yes Program Orientation Provided & Reviewed with Pt/Caregiver Including Roles  & Responsibilities: Yes  Decrease burden of Care through IP rehab admission: n/a  Possible need for SNF placement upon discharge: Not anticipated  Patient Condition: I have reviewed medical records from [facility], spoken with CSW, and patient. I met with patient at the bedside for inpatient rehabilitation assessment.  Patient will benefit from ongoing PT, OT and SLP, can actively participate in 3 hours of therapy a day 5 days of the week, and can make measurable gains during the admission.  Patient will also benefit from the coordinated team approach during an Inpatient Acute Rehabilitation admission.  The patient will receive intensive therapy as well as Rehabilitation physician, nursing, social worker, and care management interventions.  Due to safety, disease management, medication administration, pain management and patient education the patient requires 24 hour a day rehabilitation nursing.  The patient is currently min to mod assist with mobility and basic ADLs.  Discharge setting and therapy post discharge at home is anticipated.  Patient has agreed to participate in the Acute Inpatient Rehabilitation Program and will admit today.  Preadmission Screen  Completed By:  Michel Santee, PT, DPT 03/06/2019 11:22 AM ______________________________________________________________________   Discussed status with Dr. Dagoberto Ligas on 03/06/19  at 11:30 AM  and received approval for admission today.  Admission Coordinator:  Michel Santee, PT, DPT time 11:30 AM Sudie Grumbling 03/06/19    Assessment/Plan: Diagnosis: 1. Does the need for close, 24 hr/day Medical supervision in concert with the patient's rehab needs make it unreasonable for this patient to be served in a less intensive setting? Yes 2. Co-Morbidities requiring supervision/potential complications: polysubstance abuse, CAD s/p  CABG, L neglect, EF of 35-45%, HTN, impaired cognition from stroke 3. Due to bladder management, bowel management, safety, skin/wound care, disease management, medication administration, pain management and patient education, does the patient require 24 hr/day rehab nursing? Yes 4. Does the patient require coordinated care of a physician, rehab nurse, PT, OT, and SLP to address physical and functional deficits in the context of the above medical diagnosis(es)? Yes Addressing deficits in the following areas: balance, endurance, locomotion, strength, transferring, bathing, dressing, grooming, toileting, cognition and language 5. Can the patient actively participate in an intensive therapy program of at least 3 hrs of therapy 5 days a week? Yes 6. The potential for patient to make measurable gains while on inpatient rehab is good and fair 7. Anticipated functional outcomes upon discharge from inpatient rehab: modified independent and supervision PT, modified independent and supervision OT, modified independent, supervision and min assist SLP 8. Estimated rehab length of stay to reach the above functional goals is: 7-10 days 9. Anticipated discharge destination: Home 10. Overall Rehab/Functional Prognosis: fair   MD Signature:

## 2019-03-06 NOTE — Evaluation (Signed)
Speech Language Pathology Evaluation Patient Details Name: [name] MRN: [id number] DOB: 1956/03/24 Today's Date: 03/06/2019 Time: 9562-1308 SLP Time Calculation (min) (ACUTE ONLY): 25 min  Problem List:  Patient Active Problem List   Diagnosis Date Noted  . Acute cerebrovascular accident (CVA) ([place]) 03/04/2019  . Chest pain 10/16/2017  . HYPERLIPIDEMIA-MIXED 06/16/2008  . TOBACCO ABUSE 06/16/2008  . MARIJUANA ABUSE 06/16/2008  . Cocaine abuse ([place]) 06/16/2008  . CAD, ARTERY BYPASS GRAFT 04/30/2008   Past Medical History:  Past Medical History:  Diagnosis Date  . CAD (coronary artery disease)    a. 04/2008 Cath: LM nl, LAD 50p, 60m, LCX min irregs, RCA 50p/m, 40d, RPDA 60-70ost; b. 04/2008 CABG x 4: LIMA->LAD, VG->D2, VG->RPDA->RPL.  . Cocaine abuse ([place])    a. Quit early 2019.  . Ischemic cardiomyopathy    a. LV gram: EF 30-35%, apical/periapical AK.  . Tobacco abuse    Past Surgical History:  Past Surgical History:  Procedure Laterality Date  . BYPASS GRAFT    . CARDIAC SURGERY     HPI:  [name] is a 62 y.o. male with medical history significant of polysubstance abuse including cocaine, coronary artery disease with stents as well as coronary artery bypass graft back in 2010, ischemic cardiomyopathy with EF of 30 to 35%, tobacco abuse and hypertension who apparently sustained left-sided weakness in the last few days.  MRI positive for Multifocal acute ischemia within the right MCA territory.   Assessment / Plan / Recommendation Clinical Impression  Unsure of pt's cognitive baseline as he is very gaurded about that information. 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 He is closer to friends than his family and he is hopeful that he will find  enough friends to help him at discharge - he has several ideas that he needs to "check on." Pt's speech is intelligible at the conversation level. While baseline cognitive function remains unknown, pt obtained a score of 13 out of 30 on MOCA version 7.1 (n=> 26) with point added d/t possibility that pt didn't finish high school. Pt with areas that present as lower than age expected. These areas include attention, problem solving, memory and mental math. If pt persues further therapy via Inpatient Rehab would recommend targeting functional tasks such as money management with cash, medication management, and safety/anticipatory awareness to ensure safe discharge.     SLP Assessment  SLP Recommendation/Assessment: All further Speech Lanaguage Pathology  needs can be addressed in the next venue of care SLP Visit Diagnosis: Cognitive communication deficit (R41.841)    Follow Up Recommendations  Home health SLP;Inpatient Rehab (If pt can establish help at discharge, consider CIR)    Frequency and Duration   N/A        SLP Evaluation Cognition  Overall Cognitive Status: Difficult to assess Arousal/Alertness: Awake/alert Orientation Level: Oriented X4 Attention: Sustained Sustained Attention: Appears intact Memory: Impaired Memory Impairment: Storage deficit;Decreased short term memory;Decreased recall of new information Decreased Short Term Memory: Verbal basic;Functional basic Awareness: Impaired Awareness Impairment: Anticipatory impairment Problem Solving: Impaired Problem Solving Impairment: Verbal basic;Functional basic Behaviors: (mildly irritated by evaluation questions, pt gaurded) Safety/Judgment: Impaired       Comprehension  Auditory Comprehension Overall Auditory Comprehension: Appears within functional limits for tasks assessed Visual Recognition/Discrimination Discrimination: Within  Function Limits Reading Comprehension Reading Status: Not tested    Expression  Expression Primary Mode of Expression: Verbal Verbal Expression Overall Verbal Expression: Appears within functional limits for tasks assessed Written Expression Dominant Hand: Right Written Expression: Within Functional Limits   Oral / Motor  Oral Motor/Sensory Function Overall Oral Motor/Sensory Function: Within functional limits Motor Speech Overall Motor Speech: Appears within functional limits for tasks assessed   GO                    Giovoni Bunch 03/06/2019, 1:30 PM

## 2019-03-06 NOTE — Progress Notes (Signed)
  Echocardiogram 2D Echocardiogram has been performed.  [name] 03/06/2019, 12:39 PM

## 2019-03-06 NOTE — Progress Notes (Signed)
STROKE TEAM PROGRESS NOTE   INTERVAL HISTORY Patient is sitting up in bed and having echocardiogram.  Continues to have mild left-sided weakness.  2D echo had raised concern about apical clot hence limited echo is being repeated with Definity contrast.  Vitals:   03/05/19 1952 03/06/19 0038 03/06/19 0351 03/06/19 0850  BP: 113/76 116/77 106/79 111/79  Pulse: 82 69 67 67  Resp: 16 18 18 17   Temp: 98.3 F (36.8 C) (!) 97.5 F (36.4 C) 97.8 F (36.6 C) (!) 97.4 F (36.3 C)  TempSrc: Oral Oral Oral Oral  SpO2: 98% 98% 96% 100%  Height:        CBC:  Recent Labs  Lab 03/04/19 1531 03/04/19 1558 03/05/19 0443 03/06/19 0425  WBC 7.1   < > 5.8 6.2  NEUTROABS 5.9  --   --   --   HGB 11.9*  --  10.9* 10.4*  HCT 36.5*  --  33.3* 31.3*  MCV 91.3   < > 91.2 89.7  PLT 434*   < > 427* 421*   < > = values in this interval not displayed.    Basic Metabolic Panel:  Recent Labs  Lab 03/04/19 1531 03/05/19 0443 03/06/19 0425  NA  --  137 136  K  --  3.9 4.0  CL  --  104 105  CO2  --  24 22  GLUCOSE  --  84 81  BUN  --  15 12  CREATININE  --  0.92 0.82  CALCIUM  --  8.3* 8.4*  MG 2.3  --  1.9   Lipid Panel:     Component Value Date/Time   CHOL 184 03/05/2019 0443   TRIG 72 03/05/2019 0443   HDL 34 (L) 03/05/2019 0443   CHOLHDL 5.4 03/05/2019 0443   VLDL 14 03/05/2019 0443   LDLCALC 136 (H) 03/05/2019 0443   HgbA1c:  Lab Results  Component Value Date   HGBA1C 6.3 (H) 03/05/2019   Urine Drug Screen:     Component Value Date/Time   LABOPIA NONE DETECTED 03/05/2019 0133   COCAINSCRNUR POSITIVE (A) 03/05/2019 0133   COCAINSCRNUR POS (A) 12/09/2007 2020   LABBENZ NONE DETECTED 03/05/2019 0133   LABBENZ NEG 12/09/2007 2020   AMPHETMU NONE DETECTED 03/05/2019 0133   THCU NONE DETECTED 03/05/2019 0133   LABBARB NONE DETECTED 03/05/2019 0133    Alcohol Level     Component Value Date/Time   ETH <10 03/04/2019 1552    IMAGING CT ANGIO HEAD W OR WO  CONTRAST  Result Date: 03/05/2019 CLINICAL DATA:  Stroke follow-up EXAM: CT ANGIOGRAPHY HEAD AND NECK TECHNIQUE: Multidetector CT imaging of the head and neck was performed using the standard protocol during bolus administration of intravenous contrast. Multiplanar CT image reconstructions and MIPs were obtained to evaluate the vascular anatomy. Carotid stenosis measurements (when applicable) are obtained utilizing NASCET criteria, using the distal internal carotid diameter as the denominator. CONTRAST:  61mL OMNIPAQUE IOHEXOL 350 MG/ML SOLN COMPARISON:  None. FINDINGS: CTA NECK FINDINGS SKELETON: There is no bony spinal canal stenosis. No lytic or blastic lesion. OTHER NECK: Normal pharynx, larynx and major salivary glands. No cervical lymphadenopathy. Unremarkable thyroid gland. UPPER CHEST: No pneumothorax or pleural effusion. No nodules or masses. AORTIC ARCH: There is no calcific atherosclerosis of the aortic arch. There is no aneurysm, dissection or hemodynamically significant stenosis of the visualized portion of the aorta. Conventional 3 vessel aortic branching pattern. The visualized proximal subclavian arteries are widely patent. RIGHT CAROTID  SYSTEM: No dissection, occlusion or aneurysm. Mild atherosclerotic calcification at the carotid bifurcation without hemodynamically significant stenosis. LEFT CAROTID SYSTEM: Normal without aneurysm, dissection or stenosis. VERTEBRAL ARTERIES: Left dominant configuration. Both origins are clearly patent. There is no dissection, occlusion or flow-limiting stenosis to the skull base (V1-V3 segments). CTA HEAD FINDINGS POSTERIOR CIRCULATION: --Vertebral arteries: Normal V4 segments. --Posterior inferior cerebellar arteries (PICA): Patent origins from the vertebral arteries. --Anterior inferior cerebellar arteries (AICA): Patent origins from the basilar artery. --Basilar artery: Normal. --Superior cerebellar arteries: Normal. --Posterior cerebral arteries: Normal. Both  originate from the basilar artery. Posterior communicating arteries (p-comm) are diminutive or absent. ANTERIOR CIRCULATION: --Intracranial internal carotid arteries: There are severe stenoses of the cavernous segments of both internal carotid arteries due to calcific atherosclerosis. --Anterior cerebral arteries (ACA): Normal. Both A1 segments are present. Patent anterior communicating artery (a-comm). --Middle cerebral arteries (MCA): There is occlusion of the mid M1 segment of the right middle cerebral artery. The M2 branches are patent. VENOUS SINUSES: As permitted by contrast timing, patent. ANATOMIC VARIANTS: None Review of the MIP images confirms the above findings. IMPRESSION: 1. 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 2. 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 These results were communicated to Dr. Roland Rack at 12:31 am on 03/05/2019 by text page via the [facility] messaging system. Electronically Signed   By: Ulyses Jarred M.D.   On: 03/05/2019 00:33   CT ANGIO NECK W OR WO CONTRAST  Result Date: 03/05/2019 CLINICAL DATA:  Stroke follow-up EXAM: CT ANGIOGRAPHY HEAD AND NECK TECHNIQUE: Multidetector CT imaging of the head and neck was performed using the standard protocol during bolus administration of intravenous contrast. Multiplanar CT image reconstructions and MIPs were obtained to evaluate the vascular anatomy. Carotid stenosis measurements (when applicable) are obtained utilizing NASCET criteria, using the distal internal carotid diameter as the denominator. CONTRAST:  89mL OMNIPAQUE IOHEXOL 350 MG/ML SOLN COMPARISON:  None. FINDINGS: CTA NECK FINDINGS SKELETON: There is no bony spinal canal stenosis. No lytic or blastic lesion. OTHER NECK: Normal pharynx, larynx and major salivary glands. No cervical lymphadenopathy. Unremarkable thyroid gland. UPPER CHEST: No pneumothorax or pleural  effusion. No nodules or masses. AORTIC ARCH: There is no calcific atherosclerosis of the aortic arch. There is no aneurysm, dissection or hemodynamically significant stenosis of the visualized portion of the aorta. Conventional 3 vessel aortic branching pattern. The visualized proximal subclavian arteries are widely patent. RIGHT CAROTID SYSTEM: No dissection, occlusion or aneurysm. Mild atherosclerotic calcification at the carotid bifurcation without hemodynamically significant stenosis. LEFT CAROTID SYSTEM: Normal without aneurysm, dissection or stenosis. VERTEBRAL ARTERIES: Left dominant configuration. Both origins are clearly patent. There is no dissection, occlusion or flow-limiting stenosis to the skull base (V1-V3 segments). CTA HEAD FINDINGS POSTERIOR CIRCULATION: --Vertebral arteries: Normal V4 segments. --Posterior inferior cerebellar arteries (PICA): Patent origins from the vertebral arteries. --Anterior inferior cerebellar arteries (AICA): Patent origins from the basilar artery. --Basilar artery: Normal. --Superior cerebellar arteries: Normal. --Posterior cerebral arteries: Normal. Both originate from the basilar artery. Posterior communicating arteries (p-comm) are diminutive or absent. ANTERIOR CIRCULATION: --Intracranial internal carotid arteries: There are severe stenoses of the cavernous segments of both internal carotid arteries due to calcific atherosclerosis. --Anterior cerebral arteries (ACA): Normal. Both A1 segments are present. Patent anterior communicating artery (a-comm). --Middle cerebral arteries (MCA): There is occlusion of the mid M1 segment of the right middle cerebral artery. The M2 branches are patent. VENOUS SINUSES: As  permitted by contrast timing, patent. ANATOMIC VARIANTS: None Review of the MIP images confirms the above findings. IMPRESSION: 1. 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 2. Severe stenoses of the cavernous segments of both internal  carotid arteries secondary to calcific atherosclerosis. These results were communicated to Dr. Roland Rack at 12:31 am on 03/05/2019 by text page via the [facility] messaging system. Electronically Signed   By: Ulyses Jarred M.D.   On: 03/05/2019 00:33   MR Brain Wo Contrast (neuro protocol)  Result Date: 03/04/2019 CLINICAL DATA:  Ataxia EXAM: MRI HEAD WITHOUT CONTRAST TECHNIQUE: Multiplanar, multiecho pulse sequences of the brain and surrounding structures were obtained without intravenous contrast. COMPARISON:  None. FINDINGS: Brain: There is multifocal acute ischemia within the right hemisphere, scattered within the right MCA territory with the largest lesions in the right frontal lobe and anterior right temporal lobe. No acute hemorrhage. No midline shift or other mass effect. Brain volume is normal. No hydrocephalus. There is mild cytotoxic edema associated with the ischemic sites but the brain parenchyma is otherwise normal. No chronic microhemorrhage. The midline structures are normal. Vascular: Flow voids are preserved. Skull and upper cervical spine: Normal bone marrow signal. Visualized extracranial soft tissues are normal. Sinuses/Orbits: Orbits are normal. No mastoid or middle ear effusion. Paranasal sinuses are clear. Other: None IMPRESSION: Multifocal acute ischemia within the right MCA territory. No hemorrhage or mass effect. Electronically Signed   By: Ulyses Jarred M.D.   On: 03/04/2019 19:11   CT CEREBRAL PERFUSION W CONTRAST  Result Date: 03/05/2019 CLINICAL DATA:  Stroke follow-up. History of cocaine abuse. EXAM: CT PERFUSION BRAIN TECHNIQUE: Multiphase CT imaging of the brain was performed following IV bolus contrast injection. Subsequent parametric perfusion maps were calculated using RAPID software. CONTRAST:  70mL OMNIPAQUE IOHEXOL 350 MG/ML SOLN COMPARISON:  None. FINDINGS: CT Brain Perfusion Findings: CBF (<30%) Volume: 2mL Perfusion (Tmax>6.0s) volume: 42mL. T-max is elevated  throughout much of the right MCA territory. Using a Tmax threshold of 4 seconds instead of 6 seconds demonstrates a larger region of ischemia measuring 100 mL. Mismatch Volume: 44mL Infarct Core: 0 mL Infarction Location: CT perfusion parameters do not demonstrated core infarct. However, earlier brain MRI shows abnormal diffusion restriction at multiple locations in the right MCA territory. No recent non-contrast head CT is available for determining ASPECTS. IMPRESSION: 1. No core infarct identified by CT perfusion parameters. However, earlier brain MRI shows abnormal diffusion restriction at multiple locations in the right MCA territory. 2. 7 mL region within the right frontal operculum meeting parameters for ischemic penumbra. Using a Tmax threshold of 4 seconds instead of 6 seconds demonstrates a larger region of ischemia measuring 100 mL. Electronically Signed   By: Ulyses Jarred M.D.   On: 03/05/2019 01:47   DG Chest Port 1 View  Result Date: 03/04/2019 CLINICAL DATA:  Weakness, fall. EXAM: PORTABLE CHEST 1 VIEW COMPARISON:  April 18, 2017. FINDINGS: The heart size and mediastinal contours are within normal limits. Status post coronary bypass graft. No pneumothorax or pleural effusion is noted. Left lung is clear. Large rounded density is noted in right lower lobe concerning for possible neoplasm. The visualized skeletal structures are unremarkable. IMPRESSION: Large rounded density seen in right lower lobe concerning for possible neoplasm. CT scan of the chest is recommended for further evaluation. Electronically Signed   By: Marijo Conception M.D.   On: 03/04/2019 15:49   ECHOCARDIOGRAM COMPLETE  Result Date: 03/05/2019   ECHOCARDIOGRAM REPORT  Patient Name:   [name] Date of Exam: 03/05/2019 Medical Rec #:  [id number]       Height:       67.0 in Accession #:    [id number]      Weight:       119.1 lb Date of Birth:  09-16-1955       BSA:          1.62 m Patient Age:    63 years        BP:            106/72 mmHg Patient Gender: M               HR:           75 bpm. Exam Location:  Inpatient Procedure: 2D Echo Indications:    Stroke 434.91 / I163.9  History:        Patient has prior history of Echocardiogram examinations, most                 recent 10/17/2017. CAD; Risk Factors:Current Smoker and                 Dyslipidemia. Polysubstance Abuse.  Sonographer:    Leavy Cella Referring Phys: [place] MOHAMMAD L GARBA IMPRESSIONS  1. EF 40-45% with global hypokinesis. There is akinesis of the apical septum, apical inferior, and apical segments. There is no obvious thrombus present in the LV, but would recommend a repeat contrast study to exclude apical thrombus in the patient with concerns for stroke.  2. Left ventricular ejection fraction, by visual estimation, is 40 to 45%. The left ventricle has mildly decreased function. There is no left ventricular hypertrophy.  3. Apical septal segment, apical inferior segment, and apex are abnormal.  4. The left ventricle demonstrates regional wall motion abnormalities.  5. Global right ventricle has normal systolic function.The right ventricular size is normal. No increase in right ventricular wall thickness.  6. Left atrial size was normal.  7. Right atrial size was normal.  8. The mitral valve is myxomatous. Trivial mitral valve regurgitation.  9. The tricuspid valve is grossly normal. Tricuspid valve regurgitation is trivial. 10. The aortic valve is tricuspid. Aortic valve regurgitation is not visualized. No evidence of aortic valve sclerosis or stenosis. 11. The pulmonic valve was grossly normal. Pulmonic valve regurgitation is not visualized. 12. TR signal is inadequate for assessing pulmonary artery systolic pressure. 13. The inferior vena cava is normal in size with greater than 50% respiratory variability, suggesting right atrial pressure of 3 mmHg. 14. A prior study was performed on 10/17/2017. 15. Changes from prior study are noted. 16. EF slightly improved ~45%  compared with prior. WMA remain unchanged. FINDINGS  Left Ventricle: Left ventricular ejection fraction, by visual estimation, is 40 to 45%. The left ventricle has mildly decreased function. The left ventricle demonstrates regional wall motion abnormalities. The left ventricular internal cavity size was the left ventricle is normal in size. There is no left ventricular hypertrophy. EF 40-45% with global hypokinesis. There is akinesis of the apical septum, apical inferior, and apical segments. There is no obvious thrombus present in the LV, but would recommend a repeat contrast study to exclude apical thrombus in the patient with concerns for stroke.  LV Wall Scoring: The apical septal segment, apical inferior segment, and apex are akinetic. Right Ventricle: The right ventricular size is normal. No increase in right ventricular wall thickness. Global RV systolic function is has normal systolic function. Left  Atrium: Left atrial size was normal in size. Right Atrium: Right atrial size was normal in size Pericardium: There is no evidence of pericardial effusion. 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 Tricuspid Valve: The tricuspid valve is grossly normal. Tricuspid valve regurgitation is trivial. Aortic Valve: The aortic valve is tricuspid. Aortic valve regurgitation is not visualized. The aortic valve is structurally normal, with no evidence of sclerosis or stenosis. Pulmonic Valve: The pulmonic valve was grossly normal. Pulmonic valve regurgitation is not visualized. Pulmonic regurgitation is not visualized. Aorta: The aortic root is normal in size and structure. Venous: The inferior vena cava is normal in size with greater than 50% respiratory variability, suggesting right atrial pressure of 3 mmHg. IAS/Shunts: No atrial level shunt detected by color flow Doppler. Additional Comments: A prior study was performed on 10/17/2017.  LEFT VENTRICLE PLAX 2D LVIDd:         4.50 cm   Diastology LVIDs:         3.40 cm  LV e' lateral:   8.95 cm/s LV PW:         1.20 cm  LV E/e' lateral: 6.4 LV IVS:        1.10 cm  LV e' medial:    7.09 cm/s LVOT diam:     2.20 cm  LV E/e' medial:  8.0 LV SV:         45 ml LV SV Index:   28.32 LVOT Area:     3.80 cm  RIGHT VENTRICLE RV S prime:     14.40 cm/s TAPSE (M-mode): 1.5 cm LEFT ATRIUM           Index       RIGHT ATRIUM           Index LA diam:      3.20 cm 1.97 cm/m  RA Area:     11.40 cm LA Vol (A2C): 31.0 ml 19.11 ml/m RA Volume:   24.90 ml  15.35 ml/m LA Vol (A4C): 44.3 ml 27.30 ml/m   AORTA Ao Root diam: 3.50 cm MITRAL VALVE MV Area (PHT): 5.42 cm             SHUNTS MV PHT:        40.60 msec           Systemic Diam: 2.20 cm MV Decel Time: 140 msec MV E velocity: 57.00 cm/s 103 cm/s MV A velocity: 91.70 cm/s 70.3 cm/s MV E/A ratio:  0.62       1.5  Eleonore Chiquito MD Electronically signed by Eleonore Chiquito MD Signature Date/Time: 03/05/2019/2:53:46 PM    Final     PHYSICAL EXAM Frail cachectic looking middle-aged African-American male not in distress but extremely irritable and restless. . Afebrile. Head is nontraumatic. Neck is supple without bruit.  Prominent midline scar from CABG surgery.  Cardiac exam no murmur or gallop. Lungs are clear to auscultation. Distal pulses are well felt. Neurological Exam ;  Awake  Alert oriented x 3. Normal speech and language.eye movements full without nystagmus but there is slight right gaze preference though he can look to the left all the way..fundi were not visualized. Vision acuity and fields appear normal. Hearing is normal. Palatal movements are normal. Face symmetric. Tongue midline. Normal strength, tone, reflexes and coordination on the right but mild left hemiparesis with 4/5 strength.  Diminished left grip strength.  Diminished fine finger movements on the left.  Orbits right over left upper extremity.. Normal sensation.  Gait deferred. ASSESSMENT/PLAN Mr. ARASH KARSTENS is a 63 y.o. male with  history of CAD s/p CABG, cocaine abuse, ischemic cardiomyopathy and tobacco abuse presenting with L sided weakness.   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 Severe B ICA cavernous stenosis   CT perfusion no core infarct. 7 mL R frontal operculum ischemic penumbra.   Carotid Doppler not needed.  See CT angio neck  2D Echo diminished ejection fraction 40 to 45% with global hypokinesis  LDL 136  HgbA1c 6.3  Lovenox 40 mg sq daily for VTE prophylaxis  aspirin 81 mg daily prescribed prior to admission but pt not taking, now on aspirin 325 mg daily. Given mild stroke, recommend aspirin 81 mg and plavix 75 mg daily x 3 weeks, then aspirin alone. Orders adjusted.   Therapy recommendations: CLR   disposition:  pending   Hypertension  Stable . Permissive hypertension (OK if < 220/120) but gradually normalize in 5-7 days . Long-term BP goal normotensive  Hyperlipidemia  Home meds:  lipitor 40 listed but pt not taking  Resume lipitor 40  LDL 136, goal < 70  Continue statin at discharge  Pre-Diabetes   HgbA1c 6.3  Other Stroke Risk Factors  Cigarette smoker, advised to stop smoking  ETOH use, alcohol level <10, advised to drink no more than 2 drink(s) a day  Substance abuse UDS:  Cocaine POSITIVE. Patient advised to stop using due to stroke risk.  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  Recommend continue ongoing stroke work-up.  Aspirin and Plavix for 3 weeks followed by aspirin alone.  Aggressive risk factor modification.  Patient counseled to quit using cocaine and smoking cigarettes and agreed to do so.  Discussed with patient and Dr. Margaretmary Bayley.   Follow repeat echo results with contrast if apical clot is found may need to be switched to anticoagulation otherwise continue antiplatelet therapy.  Transfer to inpatient rehab when bed available.  Discussed with Dr. Talmage Nap. greater than 50% time during this 25-minute visit was spent on counseling and coordination of care about his embolic stroke and cocaine cardiomyopathy and answering questions Larry Contras, MD To contact Stroke Continuity provider, please refer to [email or web address] After hours, contact General Neurology

## 2019-03-06 NOTE — Progress Notes (Signed)
Larry Heys, MD  Physician  Physical Medicine and Rehabilitation     PMR Pre-admission  Signed     Date of Service:  03/06/2019 11:22 AM      PMR Admission Coordinator Pre-Admission Assessment  Patient: [name] is an 63 y.o., male MRN: [id number] DOB: June 29, 1955 Height: '5\' 7"'  (170.2 cm) Weight:       Insurance Information HMO:     PPO:      PCP:      IPA:      80/20:      OTHER:  PRIMARY: Medicaid [place] Access      Policy#: [id number]      Subscriber: patient CM Name:       Phone#:      Fax#:  Pre-Cert#: verified on passport onesource      Employer:  Benefits:  Phone #:      Name:  Eff. Date: active as of 03/06/19, coverage code [id number]     Deduct:       Out of Pocket Max:       Life Max:  CIR: 100%      SNF: 100% Outpatient:      Co-Pay:  Home Health:       Co-Pay:  DME:      Co-Pay:     Medicaid Application Date:       Case Manager:  Disability Application Date:       Case Worker:   The "Data Collection Information Summary" for patients in Inpatient Rehabilitation Facilities with attached "Privacy Act [place] Records" was provided and verbally reviewed with: N/A  Emergency Contact Information Uvaldo Bristle [phone]   Current Medical History  Patient Admitting Diagnosis: R MCA  History of Present Illness: [name] is a 63 year old right-handed male with history of polysubstance abuse including cocaine, CAD with stenting as well as CABG 2010 maintained on aspirin, ischemic cardiomyopathy ejection fraction of 35%, tobacco abuse and hypertension.  Presented 03/04/2019 with left-sided weakness x2 days with noted falls due to weakness.  MRI showed multifocal acute ischemia within the right MCA territory.  No hemorrhage or mass-effect.  Admission chemistries unremarkable, hemoglobin 11.8, alcohol negative, SARS coronavirus negative, urinalysis negative nitrite, urine drug screen positive cocaine.  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  Echocardiogram with ejection fraction of 45% and global hypokinesis.  Neurology consulted maintain on aspirin and Plavix x3 weeks and aspirin alone.  Subcutaneous Lovenox for DVT prophylaxis.  Tolerating a regular diet.  Therapy evaluations completed and patient was recommended for a comprehensive rehab program.  Complete NIHSS TOTAL: 4  Patient's medical record from [facility] has been reviewed by the rehabilitation admission coordinator and physician.   Past Medical History   CAD (coronary artery disease)  a. 04/2008 Cath: LM nl, LAD 50p, 30m LCX min irregs, RCA 50p/m, 40d, RPDA 60-70ost; b. 04/2008 CABG x 4: LIMA->LAD, VG->D2, VG->RPDA->RPL.   Cocaine abuse ([place]  a. Quit early 2019.   Ischemic cardiomyopathy  a. LV gram: EF 30-35%, apical/periapical AK.   Tobacco abuse   Family History   family history is not on file.  Prior Rehab/Hospitalizations  Has the patient had prior rehab or hospitalizations prior to admission? No Has the patient had major surgery during 100 days prior to admission? No  Current Medications Current Facility-Administered Medications:  .  0.9 %  sodium chloride infusion, , Intravenous, Continuous, Pokhrel, Laxman, MD, Last Rate: 50 mL/hr at 03/06/19 0736, New Bag at 03/06/19 0736 .  acetaminophen (TYLENOL) tablet 650 mg, 650 mg, Oral, Q4H PRN, 650 mg at 03/06/19 0054 **OR** acetaminophen (TYLENOL) 160 MG/5ML solution 650 mg, 650 mg, Per Tube, Q4H PRN **OR** acetaminophen (TYLENOL) suppository 650 mg, 650 mg, Rectal, Q4H PRN, Jonelle Sidle, Mohammad L, MD .  aspirin EC tablet 81 mg, 81 mg, Oral, Daily, Biby, Sharon L, NP, 81 mg at 03/06/19 1036 .  atorvastatin (LIPITOR) tablet 40 mg, 40 mg, Oral, q1800, Biby, Sharon L, NP, 40 mg at 03/05/19 1806 .  clopidogrel (PLAVIX)  tablet 75 mg, 75 mg, Oral, Daily, Biby, Sharon L, NP, 75 mg at 03/06/19 1036 .  enoxaparin (LOVENOX) injection 40 mg, 40 mg, Subcutaneous, Daily, Jonelle Sidle, Mohammad L, MD, 40 mg at 03/06/19 1036 .  nicotine (NICODERM CQ - dosed in mg/24 hours) patch 21 mg, 21 mg, Transdermal, Daily, Jonelle Sidle, Mohammad L, MD, 21 mg at 03/06/19 1039 .  senna-docusate (Senokot-S) tablet 1 tablet, 1 tablet, Oral, QHS PRN, Elwyn Reach, MD  Patients Current Diet:  Diet Heart Room service appropriate? Yes; Fluid consistency: Thin  Diet effective now      Precautions / Restrictions Precautions: Fall Precaution Comments: L weakness Restrictions Weight Bearing Restrictions: No    Has the patient had 2 or more falls or a fall with injury in the past year? No   Prior Activity Level Community (5-7x/wk): worked part time in concrete, no DME used prior to admission, polysubstance abuse     Prior Functional Level Self Care: Did the patient need help bathing, dressing, using the toilet or eating? Independent  Indoor Mobility: Did the patient need assistance with walking from room to room (with or without device)? Independent  Stairs: Did the patient need assistance with internal or external stairs (with or without device)? Independent  Functional Cognition: Did the patient need help planning regular tasks such as shopping or remembering to take medications? Independent  Home Assistive Devices / Equipment Home Equipment: None  Prior Device Use: Indicate devices/aids used by the patient prior to current illness, exacerbation or injury? None of the above  Current Functional Level  Cognition Overall Cognitive Status: Impaired/Different from baseline Current Attention Level: Sustained Orientation Level: Oriented X4 Safety/Judgement: Decreased awareness of safety, Decreased awareness of deficits General Comments: some decreased L awareness  Extremity Assessment Upper Extremity Assessment: LUE  deficits/detail LUE Deficits / Details: AROM WFL  Strength: Shoulder 4-/5, biceps and triceps 3+/5, grip 3/5, finger extension 3/5 LUE Coordination: decreased fine motor, decreased gross motor  Lower Extremity Assessment: Defer to PT evaluation LLE Deficits / Details: AROM WFL, strength hip flexion 3-/5, knee extension 4/5   ADLs Overall ADL's : Needs assistance/impaired Eating/Feeding: Set up, Sitting Eating/Feeding Details (indicate cue type and reason): requires assist to cut food  Grooming: Wash/dry hands, Wash/dry face, Oral care, Minimal assistance, Sitting, Cueing for compensatory techniques Grooming Details (indicate cue type and reason): Pt can groom sitting in chair but requires cues to use his LUE as an assist.  LUE moves fairly well but pt neglects to use if functionally.  Upper Body Bathing: Minimal assistance, Sitting, Cueing for compensatory techniques Upper Body Bathing Details (indicate cue type and reason): cues to attempt to use LUE to bathe RUE. Lower Body Bathing: Moderate assistance, Sit to/from stand, Cueing for compensatory techniques Lower Body Bathing Details (indicate cue  type and reason): Pt requires cues to cross legs to wash feet instead of reaching to feet due to poor balance.  Pt requires mod assist when in standing and doing functional task in standing.    Upper Body Dressing : Moderate assistance, Sitting, Cueing for compensatory techniques Upper Body Dressing Details (indicate cue type and reason): Once cues given to dress L side first, pt could donn gown with min assist.  Pt forgot cues given to dress L side first after 5 minute delay when asked to dress self again. Lower Body Dressing: Maximal assistance, Sit to/from stand, Cueing for compensatory techniques, Cueing for safety Lower Body Dressing Details (indicate cue type and reason): Pt fatigued and resistant to dressing LE therefore may have taken more assist than necessary.  Pt doffed socks but was uable  to donn due to fatigue.  Toilet Transfer: Minimal assistance, Stand-pivot, BSC, RW Toilet Transfer Details (indicate cue type and reason): Pt requires cues with use of walker.  First grasping with L hand but eventually not gripping L side of walker with L hand. Toileting- Clothing Manipulation and Hygiene: Moderate assistance, Sit to/from stand, Cueing for compensatory techniques Toileting - Clothing Manipulation Details (indicate cue type and reason): Pt stood at walker while therapist assited to clean pt.  Functional mobility during ADLs: Minimal assistance, Rolling walker General ADL Comments: Pt required assist with techniques with LUE being weak and requires more assist when on his feet due to safety issued.  L neglect is mild but present which presents a safety risk.    Mobility Overal bed mobility: Needs Assistance Bed Mobility: Supine to Sit Supine to sit: Min assist, HOB elevated General bed mobility comments: assist to get sqared up on side of bed but did get to sitting without physical assist.   Transfers Overall transfer level: Needs assistance Equipment used: Rolling walker (2 wheeled), 1 person hand held assist Transfers: Sit to/from Stand, Stand Pivot Transfers Sit to Stand: Min assist Stand pivot transfers: Mod assist General transfer comment: Pt unsteady when on his feet neglecting LLE while moving at times    Ambulation / Gait / Stairs / Wheelchair Mobility Ambulation/Gait assistance: Herbalist (Feet): 90 Feet Assistive device: Standard walker Gait Pattern/deviations: Step-to pattern, Step-through pattern, Decreased step length - left, Decreased stance time - left General Gait Details: difficulty with using SW (only one available in ED), then needing assist for balance, cues to prevent anterior LOB    Posture / Balance  Overall balance assessment: Needs assistance Sitting-balance support: Feet supported Sitting balance-Leahy Scale: Fair Standing  balance support: Bilateral upper extremity supported, During functional activity Standing balance-Leahy Scale: Poor Standing balance comment: must have outside support when on his feet.    Special needs/care consideration  BiPAP/CPAP  no CPM  no Continuous Drip IV  no Dialysis  no        Days n/a Life Vest no Oxygen no Special Bed no Trach Size no Wound Vac (area) no      Location n/a Skin intact                             Location n/a Bowel mgmt: continent Bladder mgmt: continent Diabetic mgmt: no Behavioral consideration polysubstance abuse at baseline Chemo/radiation    Previous Home Environment (from acute therapy documentation) Living Arrangements: Alone Available Help at Discharge: Available PRN/intermittently, Friend(s) Type of Home: House Home Layout: One level Home Access: Stairs to enter [employer]  of Steps: 2 Bathroom Shower/Tub: Tub/shower unit, Door [employer]: Standard Additional Comments: does not drive. Gets ride to work  Discharge Living Setting Plans for Discharge Living Setting: Patient's home, Lives with (comment)(boarding home) Type of Home at Discharge: Other (Comment) Discharge Home Layout: Able to live on main level with bedroom/bathroom Discharge Home Access: Stairs to enter Entrance Stairs-Rails: None Entrance Stairs-Number of Steps: 1-2 Discharge Bathroom Shower/Tub: Tub/shower unit Discharge Bathroom Toilet: Standard Discharge Bathroom Accessibility: Yes How Accessible: Accessible via walker Does the patient have any problems obtaining your medications?: No   Social/Family/Support Systems Anticipated Caregiver: Mercer Pod (landlord) Anticipated Caregiver's Contact Information: [phone] Ability/Limitations of Caregiver: states there are several people living at the house who are able to provide help if needed Caregiver Availability: 24/7 Discharge Plan Discussed with Primary Caregiver: Yes Is Caregiver In  Agreement with Plan?: Yes Does Caregiver/Family have Issues with Lodging/Transportation while Pt is in Rehab?: No  Goals/Additional Needs Patient/Family Goal for Rehab: PT/OT/SLP supervision to mod I Expected length of stay: 9-12 days Dietary Needs: heart/thin Pt/Family Agrees to Admission and willing to participate: Yes Program Orientation Provided & Reviewed with Pt/Caregiver Including Roles  & Responsibilities: Yes  Decrease burden of Care through IP rehab admission: n/a  Possible need for SNF placement upon discharge: Not anticipated  Patient Condition: I have reviewed medical records from [facility], spoken with CSW, and patient. I met with patient at the bedside for inpatient rehabilitation assessment.  Patient will benefit from ongoing PT, OT and SLP, can actively participate in 3 hours of therapy a day 5 days of the week, and can make measurable gains during the admission.  Patient will also benefit from the coordinated team approach during an Inpatient Acute Rehabilitation admission.  The patient will receive intensive therapy as well as Rehabilitation physician, nursing, social worker, and care management interventions.  Due to safety, disease management, medication administration, pain management and patient education the patient requires 24 hour a day rehabilitation nursing.  The patient is currently min to mod assist with mobility and basic ADLs.  Discharge setting and therapy post discharge at home is anticipated.  Patient has agreed to participate in the Acute Inpatient Rehabilitation Program and will admit today.     Preadmission Screen Completed By:  Michel Santee, PT, DPT 03/06/2019 11:22 AM  ______________________________________________________________________    Discussed status with Dr. Dagoberto Ligas on 03/06/19  at 11:30 AM  and received approval for admission today.  Admission Coordinator:  Michel Santee, PT, DPT time 11:30 AM Sudie Grumbling 03/06/19     Assessment/Plan:  Diagnosis: functional deficits due to CVA 1.Does the need for close, 24 hr/day Medical supervision in concert with the patient's rehab needs make it unreasonable for this patient to be served in a less intensive setting? Yes 2.Co-Morbidities requiring supervision/potential complications: polysubstance abuse, CAD s/p CABG, L neglect, EF of 35-45%, HTN, impaired cognition from stroke 3.Due to bladder management, bowel management, safety, skin/wound care, disease management, medication administration, pain management and patient education, does the patient require 24 hr/day rehab nursing? Yes 4.Does the patient require coordinated care of a physician, rehab nurse, PT, OT, and SLP to address physical and functional deficits in the context of the above medical diagnosis(es)? Yes Addressing deficits in the following areas: balance, endurance, locomotion, strength, transferring, bathing, dressing, grooming, toileting, cognition and language 5.Can the patient actively participate in an intensive therapy program of at least 3 hrs of therapy 5 days a week? Yes 6.The potential for patient to make  measurable gains while on inpatient rehab is good and fair 7.Anticipated functional outcomes upon discharge from inpatient rehab: modified independent and supervision PT, modified independent and supervision OT, modified independent, supervision and min assist SLP 8.Estimated rehab length of stay to reach the above functional goals is: 7-10 days 9.Anticipated discharge destination: Home 10. Overall Rehab/Functional Prognosis: fair  MD Signature: Electronically signed by Larry Heys, MD at 03/06/2019 12:08 PM ED to Hosp-Admission (Discharged) on 03/04/2019 Revision History Detailed Report

## 2019-03-06 NOTE — Evaluation (Signed)
Occupational Therapy Evaluation Patient Details Name: [name] MRN: [id number] DOB: [birth year] Today's Date: 03/06/2019    History of Present Illness [name] is a 63 y.o. male with medical history significant of polysubstance abuse including cocaine, coronary artery disease with stents as well as coronary artery bypass graft back in 2010, ischemic cardiomyopathy with EF of 30 to 35%, tobacco abuse and hypertension who apparently sustained left-sided weakness in the last few days.  MRI positive for Multifocal acute ischemia within the right MCA territory.   Clinical Impression   Pt admitted with the above diagnosis and has the deficits outlined below Pt would benefit from cont OT to increase strength and functional use of LUE as well as address cognitive deficits that are all impeding his independence with basic adls.  Pt lives alone in a rented room in a home and works in concrete business. Pt states he has friends that can assist but not sure how much they can assist. Pt may be able to reach mod I level of independence with inpatient rehab.  Pt is motivated and works hard.  Further OT to focus on LUE strengthening and functional cognition as well as balance for increased safety during adls.  Will continue to see acutely.    Follow Up Recommendations  CIR    Equipment Recommendations  3 in 1 bedside commode    Recommendations for Other Services       Precautions / Restrictions Precautions Precautions: Fall Precaution Comments: L weakness Restrictions Weight Bearing Restrictions: No      Mobility Bed Mobility Overal bed mobility: Needs Assistance Bed Mobility: Supine to Sit     Supine to sit: Min assist;HOB elevated     General bed mobility comments: assist to get sqared up on side of bed but did get to sitting without physical assist.  Transfers Overall transfer level: Needs assistance Equipment used: Rolling walker (2 wheeled);1 person hand held  assist Transfers: Sit to/from [facility] Sit to Stand: Min assist Stand pivot transfers: Mod assist       General transfer comment: Pt unsteady when on his feet neglecting LLE while moving at times    Balance Overall balance assessment: Needs assistance Sitting-balance support: Feet supported Sitting balance-Leahy Scale: Fair     Standing balance support: Bilateral upper extremity supported;During functional activity Standing balance-Leahy Scale: Poor Standing balance comment: must have outside support when on his feet.                           ADL either performed or assessed with clinical judgement   ADL Overall ADL's : Needs assistance/impaired Eating/Feeding: Set up;Sitting Eating/Feeding Details (indicate cue type and reason): requires assist to cut food Grooming: Wash/dry hands;Wash/dry face;Oral care;Minimal assistance;Sitting;Cueing for compensatory techniques Grooming Details (indicate cue type and reason): Pt can groom sitting in chair but requires cues to use his LUE as an assist.  LUE moves fairly well but pt neglects to use if functionally. Upper Body Bathing: Minimal assistance;Sitting;Cueing for compensatory techniques Upper Body Bathing Details (indicate cue type and reason): cues to attempt to use LUE to bathe RUE. Lower Body Bathing: Moderate assistance;Sit to/from stand;Cueing for compensatory techniques Lower Body Bathing Details (indicate cue type and reason): Pt requires cues to cross legs to wash feet instead of reaching to feet due to poor balance.  Pt requires mod assist when in standing and doing functional task in standing.   Upper Body Dressing : Moderate  assistance;Sitting;Cueing for compensatory techniques Upper Body Dressing Details (indicate cue type and reason): Once cues given to dress L side first, pt could donn gown with min assist.  Pt forgot cues given to dress L side first after 5 minute delay when asked to dress self  again. Lower Body Dressing: Maximal assistance;Sit to/from stand;Cueing for compensatory techniques;Cueing for safety Lower Body Dressing Details (indicate cue type and reason): Pt fatigued and resistant to dressing LE therefore may have taken more assist than necessary.  Pt doffed socks but was uable to donn due to fatigue. Toilet Transfer: Minimal Systems analyst Details (indicate cue type and reason): Pt requires cues with use of walker.  First grasping with L hand but eventually not gripping L side of walker with L hand. Toileting- Clothing Manipulation and Hygiene: Moderate assistance;Sit to/from stand;Cueing for compensatory techniques Toileting - Clothing Manipulation Details (indicate cue type and reason): Pt stood at walker while therapist assited to clean pt.     Functional mobility during ADLs: Minimal assistance;Rolling walker General ADL Comments: Pt required assist with techniques with LUE being weak and requires more assist when on his feet due to safety issued.  L neglect is mild but present which presents a safety risk.      Vision Baseline Vision/History: No visual deficits Patient Visual Report: No change from baseline Vision Assessment?: No apparent visual deficits     Perception Perception Perception Tested?: Yes Perception Deficits: Inattention/neglect Inattention/Neglect: Does not attend to left visual field;Does not attend to right side of body   Praxis Praxis Praxis tested?: Within functional limits    Pertinent Vitals/Pain Pain Assessment: No/denies pain     Hand Dominance Right   Extremity/Trunk Assessment Upper Extremity Assessment Upper Extremity Assessment: LUE deficits/detail LUE Deficits / Details: AROM WFL  Strength: Shoulder 4-/5, biceps and triceps 3+/5, grip 3/5, finger extension 3/5 LUE Coordination: decreased fine motor;decreased gross motor   Lower Extremity Assessment Lower Extremity Assessment: Defer to PT  evaluation   Cervical / Trunk Assessment Cervical / Trunk Assessment: Normal   Communication Communication Communication: No difficulties   Cognition Arousal/Alertness: Awake/alert Behavior During Therapy: WFL for tasks assessed/performed Overall Cognitive Status: Impaired/Different from baseline Area of Impairment: Memory;Orientation;Attention;Problem solving;Awareness;Safety/judgement                 Orientation Level: Disoriented to;Time Current Attention Level: Sustained Memory: Decreased short-term memory   Safety/Judgement: Decreased awareness of safety;Decreased awareness of deficits Awareness: Emergent Problem Solving: Slow processing General Comments: some decreased L awareness   General Comments  Pt overall limited by decreased memory, problem solving and L side weakness.    Exercises     Shoulder Instructions      Home Living Family/patient expects to be discharged to:: Private residence Living Arrangements: Alone Available Help at Discharge: Available PRN/intermittently;Friend(s) Type of Home: House Home Access: Stairs to enter [employer] of Steps: 2   Home Layout: One level     Bathroom Shower/Tub: Tub/shower unit;Door   Bathroom Toilet: Standard     Home Equipment: None   Additional Comments: does not drive.  Gets ride to work      Prior Functioning/Environment Level of Independence: Independent        Comments: works in Theatre manager Problem List: Decreased strength;Impaired balance (sitting and/or standing);Impaired vision/perception;Decreased coordination;Decreased cognition;Decreased safety awareness;Decreased knowledge of use of DME or AE;Impaired UE functional use      OT Treatment/Interventions: Self-care/ADL training;Therapeutic activities;DME and/or AE instruction;Therapeutic exercise  OT Goals(Current goals can be found in the care plan section) Acute Rehab OT Goals Patient Stated Goal: to return to  independent OT Goal Formulation: With patient Time For Goal Achievement: 03/20/19 Potential to Achieve Goals: Good ADL Goals Pt Will Perform Grooming: with min assist;standing Pt Will Perform Upper Body Dressing: with supervision;sitting Pt Will Perform Lower Body Dressing: with min guard assist;sit to/from stand Pt/caregiver will Perform Home Exercise Program: Left upper extremity;With written HEP provided Additional ADL Goal #1: pt will walk to bathroom with walker and toilet on 3:1 over comode with min guard for safety.  OT Frequency: Min 2X/week   Barriers to D/C: Decreased caregiver support  pt lives alone.  Has friends to assist PRN       Co-evaluation              AM-PAC OT "6 Clicks" Daily Activity     Outcome Measure Help from another person eating meals?: A Little Help from another person taking care of personal grooming?: A Little Help from another person toileting, which includes using toliet, bedpan, or urinal?: A Little Help from another person bathing (including washing, rinsing, drying)?: A Lot Help from another person to put on and taking off regular upper body clothing?: A Little Help from another person to put on and taking off regular lower body clothing?: A Lot 6 Click Score: 16   End of Session Equipment Utilized During Treatment: Rolling walker Nurse Communication: Mobility status  Activity Tolerance: Patient tolerated treatment well Patient left: in chair;with call bell/phone within reach;with chair alarm set  OT Visit Diagnosis: Unsteadiness on feet (R26.81);Muscle weakness (generalized) (M62.81);Hemiplegia and hemiparesis Hemiplegia - Right/Left: Left Hemiplegia - dominant/non-dominant: Non-Dominant Hemiplegia - caused by: Cerebral infarction                Time: 2993-7169 OT Time Calculation (min): 27 min Charges:  OT General Charges $OT Visit: 1 Visit OT Evaluation $OT Eval Moderate Complexity: 1 Mod OT Treatments $Self Care/Home  Management : 8-22 mins  Glenford Peers 03/06/2019, 9:47 AM

## 2019-03-06 NOTE — Progress Notes (Signed)
Rehab Admissions Coordinator Note:  Per PT recommendation, this patient was screened by Raechel Ache for appropriateness for an Inpatient Acute Rehab Consult.  At this time, we are recommending Inpatient Rehab consult.  AC will place consult order in the chart to allow for further assessment of pt's candidacy for IP Rehab.   Raechel Ache 03/06/2019, 7:57 AM  I can be reached at [phone].

## 2019-03-06 NOTE — Progress Notes (Signed)
Patient arrived to unit and toileted. Oriented to unit activities. Agreed to follow safety plan and call for all needs. States he has "boil" in left groin which is tender and painful. Lump palpable in left groin. Says he has discussed with MD on other unit.

## 2019-03-06 NOTE — Discharge Summary (Signed)
Physician Discharge Summary  [name] WNU:[id number] DOB: Oct 15, 1955 DOA: 03/04/2019  PCP: [facility], [facility]  Admit date: 03/04/2019 Discharge date: 03/06/2019  Admitted From: Home  Discharge disposition: CIR   Recommendations for Outpatient Follow-Up:    Follow up with your primary care provider in one week.    Discharge Diagnosis:   Principal Problem:   Acute cerebrovascular accident (CVA) ([place]) Active Problems:   TOBACCO ABUSE   Cocaine abuse ([place])   CAD, ARTERY BYPASS GRAFT   Discharge Condition: Improved.  Diet recommendation: Low sodium, heart healthy.    Wound care: None.  Code status: Full.   History of Present Illness:   [name] a 63 y.o.malewith medical history significant ofpolysubstance abuse including cocaine, coronary artery disease with stents as well as coronary artery bypass graft back in 2010, ischemic cardiomyopathy with EF of 30 to 35%, tobacco abuse and hypertension who apparently sustained left-sided weakness in the last few days. For 2 days prior to presentation, he had been feeling weak and dragging his left leg. Patient was a poor historian. He had been weak and debilitated and  was unable to move around so he called EMS that brought him to the ER. Stroke suspected but appears to be at least 63 days old. Neurology has seen the patient and evaluated him. MRI done shows a right-sided CVA in the watershed distribution. Patient was therefore being admitted to the hospital for full stroke work-up..  ED Course:In the ED, temperature 98.3 blood pressure 107/65 pulse 87 respirate of 6 oxygen sat 95% room air. White count 7.1 hemoglobin 12.6 and platelets 434. Calcium 8.7 the rest of the chemistry appears to be within normal. Urinalysis is negative. Alcohol level is less than 10. MRI of the brain showed right-sided infarcts probably embolic versus watershed. Appears to be at least 63 days old.    Hospital  Course:   Following conditions were addressed during hospitalization,  Acute to subacute CVA: Patient was admitted to hospital for full CVA work-up.  CT perfusion scan showed no core infarct but ischemic penumbra in the right frontal region.  CT angiogram of the neck shows occlusion of the mid M1 segment of the right middle cerebral artery.  MRI of the head  showed multifocal acute ischemia within the right MCA territory.  2D echocardiogram from 03/05/2019 showed EF of 40 to 45% with global hypokinesis.,  Repeat echo with contrast was recommended to exclude apical thrombus.  Repeat echo with contrast has been ordered.  If there is a thrombus patient might need anticoagulation.  Updated CIR about it.  Hemoglobin A1c noted at 6.3, POC of 119.  Will need lifestyle modification.  Continue aspirin, Plavix and Lipitor..  Neurology/stroke team on board and spoke with Dr. Leonie Man yesterday.  Recommend aspirin and Plavix for 3 weeks followed by aspirin alone.  Lipid profile noted with LDL of 136.    At this time patient has been seen by physical therapy and has been excepted at CIR.   history of cocaine abuse: Urine drug screen shows  positive for cocaine.  Counseling done.  tobacco abuse: Continue nicotine patch on discharge.  coronary artery disease, history of ischemic cardiomyopathy: Status post CABG.  Continues to abuse cocaine.  Previous echo showed EF of 30 to 35%.  Echo at this time shows EF of 40 to 45% with global hypokinesis.  Compensated cardiac wise at this time.  Disposition.  At this time patient is stable for disposition to CIR  today.  Please follow 2D echocardiogram with contrast.   Medical Consultants:    Neurology,  PMR  Subjective:   Today, patient feels okay.  Denies any dizziness, headache, nausea or vomiting.  No worsening weakness.  No difficulty swallowing  Discharge Exam:   Vitals:   03/06/19 0351 03/06/19 0850  BP: 106/79 111/79  Pulse: 67 67  Resp: 18 17  Temp:  97.8 F (36.6 C) (!) 97.4 F (36.3 C)  SpO2: 96% 100%   Vitals:   03/05/19 1952 03/06/19 0038 03/06/19 0351 03/06/19 0850  BP: 113/76 116/77 106/79 111/79  Pulse: 82 69 67 67  Resp: 16 18 18 17   Temp: 98.3 F (36.8 C) (!) 97.5 F (36.4 C) 97.8 F (36.6 C) (!) 97.4 F (36.3 C)  TempSrc: Oral Oral Oral Oral  SpO2: 98% 98% 96% 100%  Height:        GENERAL: Patient is alert awake and oriented. Not in obvious distress.  Thinly built HENT: No scleral pallor or icterus. Pupils equally reactive to light. Oral mucosa is moist NECK: is supple, no palpable thyroid enlargement. CHEST: Clear to auscultation. No crackles or wheezes. Non tender on palpation. Diminished breath sounds bilaterally. CVS: S1 and S2 heard, no murmur. Regular rate and rhythm. No pericardial rub.  CABG scar noted. ABDOMEN: Soft, non-tender, bowel sounds are present. No palpable hepato-splenomegaly. EXTREMITIES: Strength 5/5 in the right upper and lower extremity.  Power 4/5 in the left upper and lower extremity. CNS:   Strength 5/5 in the right upper and lower extremity.  Power 4/5 in the left upper and lower extremity. SKIN: warm and dry without rashes.   Procedures:    None  The results of significant diagnostics from this hospitalization (including imaging, microbiology, ancillary and laboratory) are listed below for reference.     Diagnostic Studies:   CT ANGIO HEAD W OR WO CONTRAST  Result Date: 03/05/2019 CLINICAL DATA:  Stroke follow-up EXAM: CT ANGIOGRAPHY HEAD AND NECK TECHNIQUE: Multidetector CT imaging of the head and neck was performed using the standard protocol during bolus administration of intravenous contrast. Multiplanar CT image reconstructions and MIPs were obtained to evaluate the vascular anatomy. Carotid stenosis measurements (when applicable) are obtained utilizing NASCET criteria, using the distal internal carotid diameter as the denominator. CONTRAST:  18mL OMNIPAQUE IOHEXOL 350 MG/ML  SOLN COMPARISON:  None. FINDINGS: CTA NECK FINDINGS SKELETON: There is no bony spinal canal stenosis. No lytic or blastic lesion. OTHER NECK: Normal pharynx, larynx and major salivary glands. No cervical lymphadenopathy. Unremarkable thyroid gland. UPPER CHEST: No pneumothorax or pleural effusion. No nodules or masses. AORTIC ARCH: There is no calcific atherosclerosis of the aortic arch. There is no aneurysm, dissection or hemodynamically significant stenosis of the visualized portion of the aorta. Conventional 3 vessel aortic branching pattern. The visualized proximal subclavian arteries are widely patent. RIGHT CAROTID SYSTEM: No dissection, occlusion or aneurysm. Mild atherosclerotic calcification at the carotid bifurcation without hemodynamically significant stenosis. LEFT CAROTID SYSTEM: Normal without aneurysm, dissection or stenosis. VERTEBRAL ARTERIES: Left dominant configuration. Both origins are clearly patent. There is no dissection, occlusion or flow-limiting stenosis to the skull base (V1-V3 segments). CTA HEAD FINDINGS POSTERIOR CIRCULATION: --Vertebral arteries: Normal V4 segments. --Posterior inferior cerebellar arteries (PICA): Patent origins from the vertebral arteries. --Anterior inferior cerebellar arteries (AICA): Patent origins from the basilar artery. --Basilar artery: Normal. --Superior cerebellar arteries: Normal. --Posterior cerebral arteries: Normal. Both originate from the basilar artery. Posterior communicating arteries (p-comm) are diminutive or absent. ANTERIOR CIRCULATION: --Intracranial  internal carotid arteries: There are severe stenoses of the cavernous segments of both internal carotid arteries due to calcific atherosclerosis. --Anterior cerebral arteries (ACA): Normal. Both A1 segments are present. Patent anterior communicating artery (a-comm). --Middle cerebral arteries (MCA): There is occlusion of the mid M1 segment of the right middle cerebral artery. The M2 branches are  patent. VENOUS SINUSES: As permitted by contrast timing, patent. ANATOMIC VARIANTS: None Review of the MIP images confirms the above findings. IMPRESSION: 1. 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 2. 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 These results were communicated to Dr. Roland Rack at 12:31 am on 03/05/2019 by text page via the [facility] messaging system. Electronically Signed   By: Ulyses Jarred M.D.   On: 03/05/2019 00:33   CT ANGIO NECK W OR WO CONTRAST  Result Date: 03/05/2019 CLINICAL DATA:  Stroke follow-up EXAM: CT ANGIOGRAPHY HEAD AND NECK TECHNIQUE: Multidetector CT imaging of the head and neck was performed using the standard protocol during bolus administration of intravenous contrast. Multiplanar CT image reconstructions and MIPs were obtained to evaluate the vascular anatomy. Carotid stenosis measurements (when applicable) are obtained utilizing NASCET criteria, using the distal internal carotid diameter as the denominator. CONTRAST:  71mL OMNIPAQUE IOHEXOL 350 MG/ML SOLN COMPARISON:  None. FINDINGS: CTA NECK FINDINGS SKELETON: There is no bony spinal canal stenosis. No lytic or blastic lesion. OTHER NECK: Normal pharynx, larynx and major salivary glands. No cervical lymphadenopathy. Unremarkable thyroid gland. UPPER CHEST: No pneumothorax or pleural effusion. No nodules or masses. AORTIC ARCH: There is no calcific atherosclerosis of the aortic arch. There is no aneurysm, dissection or hemodynamically significant stenosis of the visualized portion of the aorta. Conventional 3 vessel aortic branching pattern. The visualized proximal subclavian arteries are widely patent. RIGHT CAROTID SYSTEM: No dissection, occlusion or aneurysm. Mild atherosclerotic calcification at the carotid bifurcation without hemodynamically significant stenosis. LEFT CAROTID SYSTEM: Normal without aneurysm,  dissection or stenosis. VERTEBRAL ARTERIES: Left dominant configuration. Both origins are clearly patent. There is no dissection, occlusion or flow-limiting stenosis to the skull base (V1-V3 segments). CTA HEAD FINDINGS POSTERIOR CIRCULATION: --Vertebral arteries: Normal V4 segments. --Posterior inferior cerebellar arteries (PICA): Patent origins from the vertebral arteries. --Anterior inferior cerebellar arteries (AICA): Patent origins from the basilar artery. --Basilar artery: Normal. --Superior cerebellar arteries: Normal. --Posterior cerebral arteries: Normal. Both originate from the basilar artery. Posterior communicating arteries (p-comm) are diminutive or absent. ANTERIOR CIRCULATION: --Intracranial internal carotid arteries: There are severe stenoses of the cavernous segments of both internal carotid arteries due to calcific atherosclerosis. --Anterior cerebral arteries (ACA): Normal. Both A1 segments are present. Patent anterior communicating artery (a-comm). --Middle cerebral arteries (MCA): There is occlusion of the mid M1 segment of the right middle cerebral artery. The M2 branches are patent. VENOUS SINUSES: As permitted by contrast timing, patent. ANATOMIC VARIANTS: None Review of the MIP images confirms the above findings. IMPRESSION: 1. 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 2. 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 These results were communicated to Dr. Roland Rack at 12:31 am on 03/05/2019 by text page via the [facility] messaging system. Electronically Signed   By: Ulyses Jarred M.D.   On: 03/05/2019 00:33   MR Brain Wo Contrast (neuro protocol)  Result Date: 03/04/2019 CLINICAL DATA:  Ataxia EXAM: MRI HEAD WITHOUT CONTRAST TECHNIQUE: Multiplanar, multiecho pulse sequences of the  brain and surrounding structures were obtained without intravenous contrast. COMPARISON:  None. FINDINGS:  Brain: There is multifocal acute ischemia within the right hemisphere, scattered within the right MCA territory with the largest lesions in the right frontal lobe and anterior right temporal lobe. No acute hemorrhage. No midline shift or other mass effect. Brain volume is normal. No hydrocephalus. There is mild cytotoxic edema associated with the ischemic sites but the brain parenchyma is otherwise normal. No chronic microhemorrhage. The midline structures are normal. Vascular: Flow voids are preserved. Skull and upper cervical spine: Normal bone marrow signal. Visualized extracranial soft tissues are normal. Sinuses/Orbits: Orbits are normal. No mastoid or middle ear effusion. Paranasal sinuses are clear. Other: None IMPRESSION: Multifocal acute ischemia within the right MCA territory. No hemorrhage or mass effect. Electronically Signed   By: Ulyses Jarred M.D.   On: 03/04/2019 19:11   CT CEREBRAL PERFUSION W CONTRAST  Result Date: 03/05/2019 CLINICAL DATA:  Stroke follow-up. History of cocaine abuse. EXAM: CT PERFUSION BRAIN TECHNIQUE: Multiphase CT imaging of the brain was performed following IV bolus contrast injection. Subsequent parametric perfusion maps were calculated using RAPID software. CONTRAST:  21mL OMNIPAQUE IOHEXOL 350 MG/ML SOLN COMPARISON:  None. FINDINGS: CT Brain Perfusion Findings: CBF (<30%) Volume: 5mL Perfusion (Tmax>6.0s) volume: 75mL. T-max is elevated throughout much of the right MCA territory. Using a Tmax threshold of 4 seconds instead of 6 seconds demonstrates a larger region of ischemia measuring 100 mL. Mismatch Volume: 25mL Infarct Core: 0 mL Infarction Location: CT perfusion parameters do not demonstrated core infarct. However, earlier brain MRI shows abnormal diffusion restriction at multiple locations in the right MCA territory. No recent non-contrast head CT is available for determining ASPECTS. IMPRESSION: 1. No core infarct identified by CT perfusion parameters. However,  earlier brain MRI shows abnormal diffusion restriction at multiple locations in the right MCA territory. 2. 7 mL region within the right frontal operculum meeting parameters for ischemic penumbra. Using a Tmax threshold of 4 seconds instead of 6 seconds demonstrates a larger region of ischemia measuring 100 mL. Electronically Signed   By: Ulyses Jarred M.D.   On: 03/05/2019 01:47   DG Chest Port 1 View  Result Date: 03/04/2019 CLINICAL DATA:  Weakness, fall. EXAM: PORTABLE CHEST 1 VIEW COMPARISON:  April 18, 2017. FINDINGS: The heart size and mediastinal contours are within normal limits. Status post coronary bypass graft. No pneumothorax or pleural effusion is noted. Left lung is clear. Large rounded density is noted in right lower lobe concerning for possible neoplasm. The visualized skeletal structures are unremarkable. IMPRESSION: Large rounded density seen in right lower lobe concerning for possible neoplasm. CT scan of the chest is recommended for further evaluation. Electronically Signed   By: Marijo Conception M.D.   On: 03/04/2019 15:49   ECHOCARDIOGRAM COMPLETE  Result Date: 03/05/2019   ECHOCARDIOGRAM REPORT   Patient Name:   ERIQUE KASER Date of Exam: 03/05/2019 Medical Rec #:  [id number]       Height:       67.0 in Accession #:    [id number]      Weight:       119.1 lb Date of Birth:  1955/06/14       BSA:          1.62 m Patient Age:    14 years        BP:           106/72 mmHg Patient Gender: M  HR:           75 bpm. Exam Location:  Inpatient Procedure: 2D Echo Indications:    Stroke 434.91 / I163.9  History:        Patient has prior history of Echocardiogram examinations, most                 recent 10/17/2017. CAD; Risk Factors:Current Smoker and                 Dyslipidemia. Polysubstance Abuse.  Sonographer:    Leavy Cella Referring Phys: [place] MOHAMMAD L GARBA IMPRESSIONS  1. EF 40-45% with global hypokinesis. There is akinesis of the apical septum, apical inferior, and  apical segments. There is no obvious thrombus present in the LV, but would recommend a repeat contrast study to exclude apical thrombus in the patient with concerns for stroke.  2. Left ventricular ejection fraction, by visual estimation, is 40 to 45%. The left ventricle has mildly decreased function. There is no left ventricular hypertrophy.  3. Apical septal segment, apical inferior segment, and apex are abnormal.  4. The left ventricle demonstrates regional wall motion abnormalities.  5. Global right ventricle has normal systolic function.The right ventricular size is normal. No increase in right ventricular wall thickness.  6. Left atrial size was normal.  7. Right atrial size was normal.  8. The mitral valve is myxomatous. Trivial mitral valve regurgitation.  9. The tricuspid valve is grossly normal. Tricuspid valve regurgitation is trivial. 10. The aortic valve is tricuspid. Aortic valve regurgitation is not visualized. No evidence of aortic valve sclerosis or stenosis. 11. The pulmonic valve was grossly normal. Pulmonic valve regurgitation is not visualized. 12. TR signal is inadequate for assessing pulmonary artery systolic pressure. 13. The inferior vena cava is normal in size with greater than 50% respiratory variability, suggesting right atrial pressure of 3 mmHg. 14. A prior study was performed on 10/17/2017. 15. Changes from prior study are noted. 16. EF slightly improved ~45% compared with prior. WMA remain unchanged. FINDINGS  Left Ventricle: Left ventricular ejection fraction, by visual estimation, is 40 to 45%. The left ventricle has mildly decreased function. The left ventricle demonstrates regional wall motion abnormalities. The left ventricular internal cavity size was the left ventricle is normal in size. There is no left ventricular hypertrophy. EF 40-45% with global hypokinesis. There is akinesis of the apical septum, apical inferior, and apical segments. There is no obvious thrombus present in  the LV, but would recommend a repeat contrast study to exclude apical thrombus in the patient with concerns for stroke.  LV Wall Scoring: The apical septal segment, apical inferior segment, and apex are akinetic. Right Ventricle: The right ventricular size is normal. No increase in right ventricular wall thickness. Global RV systolic function is has normal systolic function. Left Atrium: Left atrial size was normal in size. Right Atrium: Right atrial size was normal in size Pericardium: There is no evidence of pericardial effusion. 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 Tricuspid Valve: The tricuspid valve is grossly normal. Tricuspid valve regurgitation is trivial. Aortic Valve: The aortic valve is tricuspid. Aortic valve regurgitation is not visualized. The aortic valve is structurally normal, with no evidence of sclerosis or stenosis. Pulmonic Valve: The pulmonic valve was grossly normal. Pulmonic valve regurgitation is not visualized. Pulmonic regurgitation is not visualized. Aorta: The aortic root is normal in size and structure. Venous: The inferior vena cava is normal in size with greater than 50% respiratory  variability, suggesting right atrial pressure of 3 mmHg. IAS/Shunts: No atrial level shunt detected by color flow Doppler. Additional Comments: A prior study was performed on 10/17/2017.  LEFT VENTRICLE PLAX 2D LVIDd:         4.50 cm  Diastology LVIDs:         3.40 cm  LV e' lateral:   8.95 cm/s LV PW:         1.20 cm  LV E/e' lateral: 6.4 LV IVS:        1.10 cm  LV e' medial:    7.09 cm/s LVOT diam:     2.20 cm  LV E/e' medial:  8.0 LV SV:         45 ml LV SV Index:   28.32 LVOT Area:     3.80 cm  RIGHT VENTRICLE RV S prime:     14.40 cm/s TAPSE (M-mode): 1.5 cm LEFT ATRIUM           Index       RIGHT ATRIUM           Index LA diam:      3.20 cm 1.97 cm/m  RA Area:     11.40 cm LA Vol (A2C): 31.0 ml 19.11 ml/m RA Volume:   24.90 ml  15.35 ml/m LA Vol (A4C):  44.3 ml 27.30 ml/m   AORTA Ao Root diam: 3.50 cm MITRAL VALVE MV Area (PHT): 5.42 cm             SHUNTS MV PHT:        40.60 msec           Systemic Diam: 2.20 cm MV Decel Time: 140 msec MV E velocity: 57.00 cm/s 103 cm/s MV A velocity: 91.70 cm/s 70.3 cm/s MV E/A ratio:  0.62       1.5  Eleonore Chiquito MD Electronically signed by Eleonore Chiquito MD Signature Date/Time: 03/05/2019/2:53:46 PM    Final    DG Hip Unilat With Pelvis 2-3 Views Left  Result Date: 03/04/2019 CLINICAL DATA:  [place] today with left hip pain. EXAM: DG HIP (WITH OR WITHOUT PELVIS) 2-3V LEFT COMPARISON:  None. FINDINGS: No evidence of acute pelvic fracture. No evidence of left hip fracture. Old healed right femoral neck fracture IMPRESSION: No acute or traumatic finding. Electronically Signed   By: Nelson Chimes M.D.   On: 03/04/2019 13:34     Labs:   Basic Metabolic Panel: Recent Labs  Lab 03/04/19 1218 03/04/19 1531 03/04/19 1558 03/05/19 0133 03/05/19 0443 03/06/19 0425  NA 136  --  137  --  137 136  K 4.0  --  4.0  --  3.9 4.0  CL 99  --  101  --  104 105  CO2 25  --   --   --  24 22  GLUCOSE 90  --  119*  --  84 81  BUN 14  --  14  --  15 12  CREATININE 0.97  --  0.90 0.96 0.92 0.82  CALCIUM 8.7*  --   --   --  8.3* 8.4*  MG  --  2.3  --   --   --  1.9   GFR CrCl cannot be calculated (Unknown ideal weight.). Liver Function Tests: Recent Labs  Lab 03/04/19 1531 03/05/19 0443  AST 13* 11*  ALT 10 8  ALKPHOS 62 54  BILITOT 0.5 0.6  PROT 7.2 6.3*  ALBUMIN 2.8* 2.4*   Recent Labs  Lab 03/04/19 1531  LIPASE 20   No results for input(s): AMMONIA in the last 168 hours. Coagulation profile Recent Labs  Lab 03/04/19 1531  INR 1.1    CBC: Recent Labs  Lab 03/04/19 1218 03/04/19 1531 03/04/19 1558 03/05/19 0133 03/05/19 0443 03/06/19 0425  WBC 6.4 7.1  --  6.3 5.8 6.2  NEUTROABS  --  5.9  --   --   --   --   HGB 11.8* 11.9* 12.6* 10.4* 10.9* 10.4*  HCT 36.6* 36.5* 37.0* 32.2* 33.3* 31.3*    MCV 92.4 91.3  --  91.7 91.2 89.7  PLT 490* 434*  --  395 427* 421*   Cardiac Enzymes: No results for input(s): CKTOTAL, CKMB, CKMBINDEX, TROPONINI in the last 168 hours. BNP: Invalid input(s): POCBNP CBG: Recent Labs  Lab 03/04/19 1216  GLUCAP 77   D-Dimer No results for input(s): DDIMER in the last 72 hours. Hgb A1c Recent Labs    03/05/19 0133  HGBA1C 6.3*   Lipid Profile Recent Labs    03/05/19 0443  CHOL 184  HDL 34*  LDLCALC 136*  TRIG 72  CHOLHDL 5.4   Thyroid function studies No results for input(s): TSH, T4TOTAL, T3FREE, THYROIDAB in the last 72 hours.  Invalid input(s): FREET3 Anemia work up No results for input(s): VITAMINB12, FOLATE, FERRITIN, TIBC, IRON, RETICCTPCT in the last 72 hours. Microbiology Recent Results (from the past 240 hour(s))  SARS CORONAVIRUS 2 (TAT 6-24 HRS) Nasopharyngeal Nasopharyngeal Swab     Status: None   Collection Time: 03/04/19 10:40 PM   Specimen: Nasopharyngeal Swab  Result Value Ref Range Status   SARS Coronavirus 2 NEGATIVE NEGATIVE Final    Comment: (NOTE) SARS-CoV-2 target nucleic acids are NOT DETECTED. The SARS-CoV-2 RNA is generally detectable in upper and lower respiratory specimens during the acute phase of infection. Negative results do not preclude SARS-CoV-2 infection, do not rule out co-infections with other pathogens, and should not be used as the sole basis for treatment or other patient management decisions. Negative results must be combined with clinical observations, patient history, and epidemiological information. The expected result is Negative. Fact Sheet for Patients: SugarRoll.be Fact Sheet for Healthcare Providers: https://www.woods-mathews.com/ This test is not yet approved or cleared by the [place] FDA and  has been authorized for detection and/or diagnosis of SARS-CoV-2 by FDA under an Emergency Use Authorization (EUA). This EUA will remain   in effect (meaning this test can be used) for the duration of the COVID-19 declaration under Section 56 4(b)(1) of the Act, 21 U.S.C. section 360bbb-3(b)(1), unless the authorization is terminated or revoked sooner. Performed at [facility], [place] [address], [place], [place] [address]      Discharge Instructions:   Discharge Instructions    Activity as tolerated - No restrictions   Complete by: As directed    As per CIR   Diet - low sodium heart healthy   Complete by: As directed    Discharge instructions   Complete by: As directed    Continue to take your medications as prescribed.  Continue rehabilitation.     Allergies as of 03/06/2019   No Known Allergies     Medication List    STOP taking these medications   ibuprofen 400 MG tablet Commonly known as: ADVIL   lisinopril 2.5 MG tablet Commonly known as: ZESTRIL     TAKE these medications   acetaminophen 325 MG tablet Commonly known as: TYLENOL Take 2 tablets (650 mg  total) by mouth every 4 (four) hours as needed for mild pain (or temp > 37.5 C (99.5 F)).   aspirin 81 MG EC tablet Take 1 tablet (81 mg total) by mouth daily.   atorvastatin 40 MG tablet Commonly known as: LIPITOR Take 1 tablet (40 mg total) by mouth daily at 6 PM.   clopidogrel 75 MG tablet Commonly known as: PLAVIX Take 1 tablet (75 mg total) by mouth daily. Start taking on: March 07, 2019   enoxaparin 40 MG/0.4ML injection Commonly known as: LOVENOX Inject 0.4 mLs (40 mg total) into the skin daily. Start taking on: March 07, 2019   nicotine 21 mg/24hr patch Commonly known as: NICODERM CQ - dosed in mg/24 hours Place 1 patch (21 mg total) onto the skin daily. Start taking on: March 07, 2019   senna-docusate 8.6-50 MG tablet Commonly known as: Senokot-S Take 1 tablet by mouth at bedtime as needed for moderate constipation.         Time coordinating discharge: 39 minutes  Signed:  Louie Flenner  [facility] 03/06/2019, 11:31 AM

## 2019-03-06 NOTE — Progress Notes (Signed)
PT Cancellation Note  Patient Details Name: [name] MRN: [id number] DOB: 18-Dec-1955   Cancelled Treatment:    Reason Eval/Treat Not Completed: (P) Patient declined, no reason specified(Pt lying in bed when therapist entered he moved his head to the right & would not make eye contact.  He reports," I am sleepy & I can't do anything."  Pt educated that he will need to participate when he gets to rehab.  He continued to decline mobility.)   Nyair Depaulo J Stann Mainland 03/06/2019, 2:01 PM Erasmo Leventhal , PTA Acute Rehabilitation Services Pager [phone] Office [phone]

## 2019-03-07 ENCOUNTER — Inpatient Hospital Stay (HOSPITAL_COMMUNITY): Payer: Medicaid Other

## 2019-03-07 ENCOUNTER — Inpatient Hospital Stay (HOSPITAL_COMMUNITY): Payer: Medicaid Other | Admitting: Speech Pathology

## 2019-03-07 ENCOUNTER — Inpatient Hospital Stay (HOSPITAL_COMMUNITY): Payer: Medicaid Other | Admitting: Physical Therapy

## 2019-03-07 LAB — CBC WITH DIFFERENTIAL/PLATELET
Abs Immature Granulocytes: 0.02 10*3/uL (ref 0.00–0.07)
Basophils Absolute: 0 10*3/uL (ref 0.0–0.1)
Basophils Relative: 1 %
Eosinophils Absolute: 0.1 10*3/uL (ref 0.0–0.5)
Eosinophils Relative: 1 %
HCT: 34.4 % — ABNORMAL LOW (ref 39.0–52.0)
Hemoglobin: 11.5 g/dL — ABNORMAL LOW (ref 13.0–17.0)
Immature Granulocytes: 0 %
Lymphocytes Relative: 12 %
Lymphs Abs: 0.8 10*3/uL (ref 0.7–4.0)
MCH: 29.6 pg (ref 26.0–34.0)
MCHC: 33.4 g/dL (ref 30.0–36.0)
MCV: 88.7 fL (ref 80.0–100.0)
Monocytes Absolute: 0.5 10*3/uL (ref 0.1–1.0)
Monocytes Relative: 7 %
Neutro Abs: 5.3 10*3/uL (ref 1.7–7.7)
Neutrophils Relative %: 79 %
Platelets: 451 10*3/uL — ABNORMAL HIGH (ref 150–400)
RBC: 3.88 MIL/uL — ABNORMAL LOW (ref 4.22–5.81)
RDW: 13.2 % (ref 11.5–15.5)
WBC: 6.8 10*3/uL (ref 4.0–10.5)
nRBC: 0 % (ref 0.0–0.2)

## 2019-03-07 LAB — COMPREHENSIVE METABOLIC PANEL
ALT: 9 U/L (ref 0–44)
AST: 14 U/L — ABNORMAL LOW (ref 15–41)
Albumin: 2.5 g/dL — ABNORMAL LOW (ref 3.5–5.0)
Alkaline Phosphatase: 58 U/L (ref 38–126)
Anion gap: 9 (ref 5–15)
BUN: 10 mg/dL (ref 8–23)
CO2: 23 mmol/L (ref 22–32)
Calcium: 8.8 mg/dL — ABNORMAL LOW (ref 8.9–10.3)
Chloride: 101 mmol/L (ref 98–111)
Creatinine, Ser: 0.93 mg/dL (ref 0.61–1.24)
GFR calc Af Amer: >60 mL/min (ref >60)
GFR calc non Af Amer: >60 mL/min (ref >60)
Glucose, Bld: 83 mg/dL (ref 70–99)
Potassium: 4 mmol/L (ref 3.5–5.1)
Sodium: 133 mmol/L — ABNORMAL LOW (ref 135–145)
Total Bilirubin: 0.7 mg/dL (ref 0.3–1.2)
Total Protein: 6.7 g/dL (ref 6.5–8.1)

## 2019-03-07 NOTE — Progress Notes (Signed)
Inpatient Rehabilitation  Patient information reviewed and entered into eRehab system by Uriah Philipson M. Offie Waide, M.A., CCC/SLP, PPS Coordinator.  Information including medical coding, functional ability and quality indicators will be reviewed and updated through discharge.    

## 2019-03-07 NOTE — Progress Notes (Signed)
[place] PHYSICAL MEDICINE & REHABILITATION PROGRESS NOTE   Subjective/Complaints: Mr. Arakawa complains of pain    Objective:   ECHOCARDIOGRAM COMPLETE  Result Date: 03/05/2019   ECHOCARDIOGRAM REPORT   Patient Name:   [name] Date of Exam: 03/05/2019 Medical Rec #:  [id number]       Height:       67.0 in Accession #:    [id number]      Weight:       119.1 lb Date of Birth:  1955-04-24       BSA:          1.62 m Patient Age:    63 years        BP:           106/72 mmHg Patient Gender: M               HR:           75 bpm. Exam Location:  Inpatient Procedure: 2D Echo Indications:    Stroke 434.91 / I163.9  History:        Patient has prior history of Echocardiogram examinations, most                 recent 10/17/2017. CAD; Risk Factors:Current Smoker and                 Dyslipidemia. Polysubstance Abuse.  Sonographer:    Leavy Cella Referring Phys: [place] MOHAMMAD L GARBA IMPRESSIONS  1. EF 40-45% with global hypokinesis. There is akinesis of the apical septum, apical inferior, and apical segments. There is no obvious thrombus present in the LV, but would recommend a repeat contrast study to exclude apical thrombus in the patient with concerns for stroke.  2. Left ventricular ejection fraction, by visual estimation, is 40 to 45%. The left ventricle has mildly decreased function. There is no left ventricular hypertrophy.  3. Apical septal segment, apical inferior segment, and apex are abnormal.  4. The left ventricle demonstrates regional wall motion abnormalities.  5. Global right ventricle has normal systolic function.The right ventricular size is normal. No increase in right ventricular wall thickness.  6. Left atrial size was normal.  7. Right atrial size was normal.  8. The mitral valve is myxomatous. Trivial mitral valve regurgitation.  9. The tricuspid valve is grossly normal. Tricuspid valve regurgitation is trivial. 10. The aortic valve is tricuspid. Aortic valve regurgitation is not  visualized. No evidence of aortic valve sclerosis or stenosis. 11. The pulmonic valve was grossly normal. Pulmonic valve regurgitation is not visualized. 12. TR signal is inadequate for assessing pulmonary artery systolic pressure. 13. The inferior vena cava is normal in size with greater than 50% respiratory variability, suggesting right atrial pressure of 3 mmHg. 14. A prior study was performed on 10/17/2017. 15. Changes from prior study are noted. 16. EF slightly improved ~45% compared with prior. WMA remain unchanged. FINDINGS  Left Ventricle: Left ventricular ejection fraction, by visual estimation, is 40 to 45%. The left ventricle has mildly decreased function. The left ventricle demonstrates regional wall motion abnormalities. The left ventricular internal cavity size was the left ventricle is normal in size. There is no left ventricular hypertrophy. EF 40-45% with global hypokinesis. There is akinesis of the apical septum, apical inferior, and apical segments. There is no obvious thrombus present in the LV, but would recommend a repeat contrast study to exclude apical thrombus in the patient with concerns for stroke.  LV Wall Scoring: The apical septal  segment, apical inferior segment, and apex are akinetic. Right Ventricle: The right ventricular size is normal. No increase in right ventricular wall thickness. Global RV systolic function is has normal systolic function. Left Atrium: Left atrial size was normal in size. Right Atrium: Right atrial size was normal in size Pericardium: There is no evidence of pericardial effusion. 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 Tricuspid Valve: The tricuspid valve is grossly normal. Tricuspid valve regurgitation is trivial. Aortic Valve: The aortic valve is tricuspid. Aortic valve regurgitation is not visualized. The aortic valve is structurally normal, with no evidence of sclerosis or stenosis. Pulmonic Valve: The pulmonic valve  was grossly normal. Pulmonic valve regurgitation is not visualized. Pulmonic regurgitation is not visualized. Aorta: The aortic root is normal in size and structure. Venous: The inferior vena cava is normal in size with greater than 50% respiratory variability, suggesting right atrial pressure of 3 mmHg. IAS/Shunts: No atrial level shunt detected by color flow Doppler. Additional Comments: A prior study was performed on 10/17/2017.  LEFT VENTRICLE PLAX 2D LVIDd:         4.50 cm  Diastology LVIDs:         3.40 cm  LV e' lateral:   8.95 cm/s LV PW:         1.20 cm  LV E/e' lateral: 6.4 LV IVS:        1.10 cm  LV e' medial:    7.09 cm/s LVOT diam:     2.20 cm  LV E/e' medial:  8.0 LV SV:         45 ml LV SV Index:   28.32 LVOT Area:     3.80 cm  RIGHT VENTRICLE RV S prime:     14.40 cm/s TAPSE (M-mode): 1.5 cm LEFT ATRIUM           Index       RIGHT ATRIUM           Index LA diam:      3.20 cm 1.97 cm/m  RA Area:     11.40 cm LA Vol (A2C): 31.0 ml 19.11 ml/m RA Volume:   24.90 ml  15.35 ml/m LA Vol (A4C): 44.3 ml 27.30 ml/m   AORTA Ao Root diam: 3.50 cm MITRAL VALVE MV Area (PHT): 5.42 cm             SHUNTS MV PHT:        40.60 msec           Systemic Diam: 2.20 cm MV Decel Time: 140 msec MV E velocity: 57.00 cm/s 103 cm/s MV A velocity: 91.70 cm/s 70.3 cm/s MV E/A ratio:  0.62       1.5  Eleonore Chiquito MD Electronically signed by Eleonore Chiquito MD Signature Date/Time: 03/05/2019/2:53:46 PM    Final    ECHOCARDIOGRAM LIMITED  Result Date: 03/06/2019   ECHOCARDIOGRAM LIMITED REPORT   Patient Name:   [name] Date of Exam: 03/06/2019 Medical Rec #:  [id number]       Height:       67.0 in Accession #:    [id number]      Weight:       119.1 lb Date of Birth:  18-Aug-1955       BSA:          1.62 m Patient Age:    63 years        BP:  111/79 mmHg Patient Gender: M               HR:           74 bpm. Exam Location:  Inpatient  Procedure: Limited Echo and Intracardiac Opacification Agent Indications:     Stroke 434.91 / I163.9  History:        Patient has prior history of Echocardiogram examinations, most                 recent 03/05/2019. Cocaine abuse.  Sonographer:    Tiffany Dance Referring Phys: [id number] [place]  1. Left ventricular ejection fraction, by visual estimation, is 35 to 40%. The left ventricle has moderately decreased function. There is no left ventricular hypertrophy.  2. The left ventricle demonstrates regional wall motion abnormalities.  3. Global right ventricle has normal systolic function.The right ventricular size is normal.  4. Left atrial size was not assessed.  5. Right atrial size was normal.  6. Moderate pleural effusion.  7. The mitral valve was not assessed.  8. The tricuspid valve is not assessed.  9. The aortic valve was not assessed. 10. The pulmonic valve was not assessed. 11. Aortic root could not be assessed. 12. Limited study; no doppler performed; apical akinesis with moderate LV dysfunction; using definity, no obvious thrombus noted. FINDINGS  Left Ventricle: Left ventricular ejection fraction, by visual estimation, is 35 to 40%. The left ventricle has moderately decreased function. The left ventricle demonstrates regional wall motion abnormalities. Right Ventricle: The right ventricular size is normal. Global RV systolic function is has normal systolic function. Left Atrium: Left atrial size was not assessed. Right Atrium: Right atrial size was normal in size Pericardium: There is no evidence of pericardial effusion. There is a moderate pleural effusion in either the left or right lateral region. Mitral Valve: The mitral valve was not assessed. Tricuspid Valve: The tricuspid valve is not assessed. Aortic Valve: The aortic valve was not assessed. Pulmonic Valve: The pulmonic valve was not assessed. Aorta: Aortic root could not be assessed.  Additional Comments: Limited study; no doppler performed; apical akinesis with moderate LV dysfunction; using definity,  no obvious thrombus noted.  Kirk Ruths MD Electronically signed by Kirk Ruths MD Signature Date/Time: 03/06/2019/3:22:00 PM    Final    Recent Labs    03/06/19 0425 03/07/19 0607  WBC 6.2 6.8  HGB 10.4* 11.5*  HCT 31.3* 34.4*  PLT 421* 451*   Recent Labs    03/06/19 0425 03/07/19 0607  NA 136 133*  K 4.0 4.0  CL 105 101  CO2 22 23  GLUCOSE 81 83  BUN 12 10  CREATININE 0.82 0.93  CALCIUM 8.4* 8.8*    Intake/Output Summary (Last 24 hours) at 03/07/2019 0938 Last data filed at 03/07/2019 0700 Gross per 24 hour  Intake 100 ml  Output 100 ml  Net 0 ml     Physical Exam: Vital Signs Blood pressure 132/89, pulse 72, temperature 98.3 F (36.8 C), temperature source Oral, resp. rate 18, SpO2 98 %.  Physical Exam  Constitutional:  Frail appearing older man HENT:  Head: Normocephalic and atraumatic.  L facial droop at rest Facial sensation intact per pt.  Eyes: Pupils are equal, round, and reactive to light. EOM are normal.  EOMI B/L with no nystagmus Yellowish sclerae B/L with a lot of reddish blood vessels seen in sclerae  Neck: No tracheal deviation present.  Cardiovascular:  RRR, no M/R/G  Respiratory:  CTA B/L-  no W/R/R  GI:  Soft, NT, ND however also said LBM 3+ days ago- also pt reports has "boils on abdomen" that are painful. Musculoskeletal:     Cervical back: Normal range of motion and neck supple.     Comments: RUE and RLE 5/5- muscles checked- deltoid/biceps/triceps/WE/grip/finger ab/HF/KE/KF/DF/PF LUE- deltoid and biceps 3/5, triceps 3-/5, WE 3-/5, grip 4-/5, finger 4-/5 LLE- HF 2/5, KE/KF 3/5, DF/PF 4/5 Neurological:  Provides his name and age.  Follows simple commands.  He does have difficulty with subtracting as well as addition of simple problems.  Limited awareness of his deficits. Mild L neglect- although said sensation intact on L side and face.  Skin:  Skin really dry on arms and legs. Left groin with palpable nodule; non-tender to  palpation.  Psychiatric:  Normal affect.    Assessment/Plan: 1. 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  Physiatrist is providing close team supervision and 24 hour management of active medical problems listed below.  Physiatrist and rehab team continue to assess barriers to discharge/monitor patient progress toward functional and medical goals  Care Tool:  Bathing              Bathing assist       Upper Body Dressing/Undressing Upper body dressing        Upper body assist      Lower Body Dressing/Undressing Lower body dressing            Lower body assist       Toileting Toileting    Toileting assist Assist for toileting: Contact Guard/Touching assist     Transfers Chair/bed transfer  Transfers assist           Locomotion Ambulation   Ambulation assist              Walk 10 feet activity   Assist           Walk 50 feet activity   Assist           Walk 150 feet activity   Assist           Walk 10 feet on uneven surface  activity   Assist           Wheelchair     Assist               Wheelchair 50 feet with 2 turns activity    Assist            Wheelchair 150 feet activity     Assist          Blood pressure 132/89, pulse 72, temperature 98.3 F (36.8 C), temperature source Oral, resp. rate 18, SpO2 98 %.  Medical Problem List and Plan: 1.  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  Antithrombotics: -DVT/anticoagulation: Lovenox             -antiplatelet therapy: Aspirin 81 mg daily and Plavix 75 mg daily x3 weeks then aspirin alone 3. Pain Management: Tylenol as  needed 4. Mood: Provide emotional support             -  antipsychotic agents: N/A 5. Neuropsych: This patient is? capable of making decisions on his own behalf due to impaired memory and problem solving per OT/PT- has no one else to make decisions- will try to address while in CIR. 6. 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 At home he applies heat packs which give him relief; will order here.  7. Fluids/Electrolytes/Nutrition: Routine in and outs with follow-up chemistries 8.  Permissive hypertension.  Monitor with increased mobility.  Patient on lisinopril 2.5 mg daily prior to admission.  Resume as needed  12/11: BP stable 9.  History of polysubstance abuse as well as tobacco abuse.  Urine drug screen positive cocaine.  Counseling 10.  Hyperlipidemia.  Lipitor 11. 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     LOS: 1 days A FACE TO FACE EVALUATION WAS PERFORMED  Clide Deutscher Derrel Moore 03/07/2019, 9:38 AM

## 2019-03-07 NOTE — Plan of Care (Signed)
  Problem: Consults Goal: RH STROKE PATIENT EDUCATION Description: See Patient Education module for education specifics  Outcome: Progressing   Problem: RH SKIN INTEGRITY Goal: RH STG SKIN FREE OF INFECTION/BREAKDOWN Description: Free of breakdown with min assist Outcome: Progressing   Problem: RH SAFETY Goal: RH STG ADHERE TO SAFETY PRECAUTIONS W/ASSISTANCE/DEVICE Description: STG Adhere to Safety Precautions With Assistance/Device. Outcome: Progressing   Problem: RH KNOWLEDGE DEFICIT Goal: RH STG INCREASE KNOWLEDGE OF HYPERTENSION Description: Patient able to describe medications to manage blood pressure with cues/handouts Outcome: Progressing Goal: RH STG INCREASE KNOWLEDGE OF STROKE PROPHYLAXIS Description: Patient able to verbalize prevention of stroke including diet, exercise, lifestyle modifications, and medication management Outcome: Progressing

## 2019-03-07 NOTE — Evaluation (Signed)
Physical Therapy Assessment and Plan  Patient Details  Name: [name] MRN: [id number] Date of Birth: [birth year]  PT Diagnosis: Abnormality of gait, Difficulty walking, Hemiparesis non-dominant, Impaired sensation, Muscle weakness and Pain in abdomen Rehab Potential: Good ELOS: 7-10 days   Today's Date: 03/07/2019 PT Individual Time: 5003-7048 PT Individual Time Calculation (min): 59 min   and  Today's Date: 03/07/2019 PT Missed Time: 16 Minutes Missed Time Reason: Patient fatigue;Pain   Problem List:  Patient Active Problem List   Diagnosis Date Noted  . 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 Left hemiparesis ([place]) 03/06/2019  . Acute cerebrovascular accident (CVA) ([place]) 03/04/2019  . Chest pain 10/16/2017  . HYPERLIPIDEMIA-MIXED 06/16/2008  . TOBACCO ABUSE 06/16/2008  . MARIJUANA ABUSE 06/16/2008  . Cocaine abuse ([place]) 06/16/2008  . CAD, ARTERY BYPASS GRAFT 04/30/2008    Past Medical History:  Past Medical History:  Diagnosis Date  . CAD (coronary artery disease)    a. 04/2008 Cath: LM nl, LAD 50p, 45m LCX min irregs, RCA 50p/m, 40d, RPDA 60-70ost; b. 04/2008 CABG x 4: LIMA->LAD, VG->D2, VG->RPDA->RPL.  . [place]    a. Quit early 2019.  . Ischemic cardiomyopathy    a. LV gram: EF 30-35%, apical/periapical AK.  . Tobacco abuse    Past Surgical History:  Past Surgical History:  Procedure Laterality Date  . BYPASS GRAFT    . CARDIAC SURGERY      Assessment & Plan Clinical Impression: Patient is a 63y.o. year old male right-handed male with history of polysubstance abuse including cocaine, CAD with stenting as well as CABG 2010 maintained on aspirin, ischemic cardiomyopathy ejection fraction of 35%, tobacco abuse and hypertension.  Per chart review he lives alone 1 level apartment.  Independent prior to admission.  He does have local family but no true contact with them.  Presented 03/04/2019 with left-sided weakness x2 days with noted  falls due to weakness.  MRI showed multifocal acute ischemia within the right MCA territory.  No hemorrhage or mass-effect.  Admission chemistries unremarkable, hemoglobin 11.8, alcohol negative, SARS coronavirus negative, urinalysis negative nitrite, urine drug screen positive cocaine.  CT angiogram of head and neck occlusion of mid M1 segment of the right middle cerebral artery.  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  Echocardiogram with ejection fraction of 45% and global hypokinesis.  Neurology consulted maintain on aspirin and Plavix x3 weeks and aspirin alone.  Subcutaneous Lovenox for DVT prophylaxis.  Tolerating a regular diet.  Therapy evaluations completed and patient was admitted for a comprehensive rehab program. Patient transferred to CIR on 03/06/2019 .   Patient currently requires min with mobility secondary to muscle weakness, decreased cardiorespiratoy endurance, impaired timing and sequencing, unbalanced muscle activation and decreased coordination, decreased attention to left, decreased awareness, decreased problem solving and decreased safety awareness and decreased standing balance and decreased balance strategies.  Prior to hospitalization, patient was independent  with mobility and lived with Alone in a House home.  Home access is Level entry.  Patient will benefit from skilled PT intervention to maximize safe functional mobility, minimize fall risk and decrease caregiver burden for planned discharge home with 24 hour supervision.  Anticipate patient will benefit from follow up HTempeat discharge.  PT - End of Session Activity Tolerance: Tolerates 30+ min activity with multiple rests Endurance Deficit: Yes Endurance Deficit Description: requires seated rest breaks due to fatigue and lightheadedness PT Assessment Rehab  Potential (ACUTE/IP ONLY): Good PT Barriers to Discharge: Decreased caregiver support;Lack  of/limited family support;Home environment access/layout PT Patient demonstrates impairments in the following area(s): Balance;Perception;Behavior;Safety;Edema;Sensory;Endurance;Motor;Nutrition;Pain;Skin Integrity PT Transfers Functional Problem(s): Bed Mobility;Bed to Chair;Car;Furniture;Floor PT Locomotion Functional Problem(s): Ambulation;Stairs PT Plan PT Intensity: Minimum of 1-2 x/day ,45 to 90 minutes PT Frequency: 5 out of 7 days PT Duration Estimated Length of Stay: 7-10 days PT Treatment/Interventions: Ambulation/gait training;Community reintegration;DME/adaptive equipment instruction;Neuromuscular re-education;Psychosocial support;Stair training;UE/LE Strength taining/ROM;Balance/vestibular training;Discharge planning;Functional electrical stimulation;Pain management;Skin care/wound management;Therapeutic Activities;UE/LE Coordination activities;Cognitive remediation/compensation;Disease management/prevention;Functional mobility training;Patient/family education;Splinting/orthotics;Therapeutic Exercise;Visual/perceptual remediation/compensation PT Transfers Anticipated Outcome(s): supervision PT Locomotion Anticipated Outcome(s): supervision PT Recommendation Recommendations for Other Services: Neuropsych consult Follow Up Recommendations: Home health PT;24 hour supervision/assistance Patient destination: Home Equipment Recommended: To be determined  Skilled Therapeutic Intervention Evaluation completed (see details above and below) with education on PT POC and goals and individual treatment initiated with focus on bed mobility, transfers, gait, activity tolerance, L attention, and L UE NMR, as well as education regarding daily therapy schedule, weekly team meetings, purpose of PT evaluation, and other CIR information. Patient received asleep, supine in bed requiring increased time to become awake and open eyes. Patient requiring encouragement to participate in therapy session reporting  he didn't sleep last night due to the "boils" on his lower abdomen and need for frequent urination. Supine>sit with close supervision for safety. Sit<>stands, no AD, with CGA for safety/steadying throughout session. Ambulated ~71f x2 to/from bathroom, no AD, with CGA/min assist for steadying/balance. Standing with CGA for steadying continent of urine and then performed standing hand hygiene at sink. Transported patient to gym in w/c for energy conservation. Ambulated ~1585f no AD, with CGA/min assist for steadying/balance - pt demonstrates R gaze preference throughout gait with decreased gait speed and increased postural sway. Therapist encouraged patient to participate in stair navigation but despite max encouragement pt not agreeable. Transported in w/c to ortho gym. Ambulatory simulated car transfer (small SUV height), no AD, with CGA/min assist for steadying/balance. Ambulated ~1072fp ramp, ~43f23fer mulch, and down curb step with pt self selecting use of R UE support on handrail and CGA/min assist for steadying/balance. Pt reporting need to use bathroom again. Ambulated ~150ft67fADL apartment, no AD, with CGA/min assist for steadying/balance and therapist having to provide increased cuing for L attention. Toileting as described above. Sit<>stand furniture [place] in [place] ~100ft 67f to ortho gym, no AD, with CGA/min assist. Performed standing tolerance, L UE NMR, and L attention task using dynavision with CGA for steadying and pt demonstrating decreased reaction time on L side of board - while standing pt reporting onset of lightheadedness requiring seated rest break for symptom dissipation. Patient then reporting onset of pain/discomfort in the "boil" areas of his lower abdomen. Transported in w/c to main therapy gym. Patient deferring further standing activity at this time. Performed seated L UE NMR task using screws/bolts with pt having to use largest size bolts  and significantly increased time with 3 rest breaks to complete putting on/off 2 bolts. Patient reporting very fatigued and increased discomfort in "boils" with pt requesting to return to room. With encouragement pt agreeable to ambulate back to room. Gait ~150ft, 58fD, with CGA/min assist for steadying/balance back to room. RN notified of pt's increased pain. Sit>supine with supervision. Pt left supine in bed with needs in reach and bed alarm on.  PT Evaluation Precautions/Restrictions Precautions Precautions: Fall Precaution Comments: L weakness and L inattention Restrictions Weight Bearing Restrictions:  No Pain Pain Assessment Pain Scale: Faces Pain Score: 0-No pain Faces Pain Scale: Hurts little more Pain Type: Acute pain Pain Location: Abdomen Pain Orientation: Lower Pain Descriptors / Indicators: Aching Pain Onset: On-going Pain Intervention(s): RN made aware;Rest;Relaxation;Emotional support Home Living/Prior Functioning Home Living Living Arrangements: Alone Available Help at Discharge: Available PRN/intermittently;Friend(s) Type of Home: House(rents room in boarding home per chart) Home Access: Level entry Entrance Stairs-Number of Steps: 2 Home Layout: One level Bathroom Shower/Tub: Tub/shower unit;Door Bathroom Toilet: Standard Additional Comments: does not drive.  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 Patient providing above information and unsure of accuracy due to some inconsisitencies in pt responses. Perception  Perception Perception: Impaired Inattention/Neglect: Does not attend to left visual field;Does not attend to left side of body Praxis Praxis: Intact  Cognition Overall Cognitive Status: Difficult to assess Arousal/Alertness: Awake/alert Orientation  Level: Oriented X4 Awareness: Impaired Safety/Judgment: Impaired Sensation Sensation Light Touch: Impaired by gross assessment(L UE numbness, denies numbness in L LE) Hot/Cold: Not tested Proprioception: Impaired by gross assessment Stereognosis: Not tested Coordination Gross Motor Movements are Fluid and Coordinated: No Fine Motor Movements are Fluid and Coordinated: No Coordination and Movement Description: gross motor movements impaired due to L hemiparesis and L inattention Motor  Motor Motor: Hemiplegia Motor - Skilled Clinical Observations: mild L hemiparesis  Mobility Bed Mobility Bed Mobility: Supine to Sit;Sit to Supine Supine to Sit: Supervision/Verbal cueing Sit to Supine: Supervision/Verbal cueing Transfers Transfers: Sit to Stand;Stand Pivot Transfers;Stand to Sit Sit to Stand: Contact Guard/Touching assist;Minimal Assistance - Patient > 75% Stand to Sit: Contact Guard/Touching assist;Minimal Assistance - Patient > 75% Stand Pivot Transfers: Contact Guard/Touching assist;Minimal Assistance - Patient > 75% Stand Pivot Transfer Details: Verbal cues for gait pattern;Verbal cues for precautions/safety;Tactile cues for weight shifting;Verbal cues for technique Transfer (Assistive device): None Locomotion  Gait Ambulation: Yes Gait Assistance: Contact Guard/Touching assist;Minimal Assistance - Patient > 75% Gait Distance (Feet): 150 Feet Assistive device: None Gait Assistance Details: Verbal cues for precautions/safety;Verbal cues for gait pattern;Verbal cues for technique;Tactile cues for weight shifting Gait Gait: Yes Gait Pattern: Impaired Gait Pattern: Decreased weight shift to left Gait velocity: decreased Stairs / Additional Locomotion Stairs: No(pt deferred performing stairs) Ramp: Minimal Assistance - Patient >75% Curb: Minimal Assistance - Patient >75% Wheelchair Mobility Wheelchair Mobility: No  Trunk/Postural Assessment  Cervical Assessment Cervical  Assessment: Within Functional Limits Thoracic Assessment Thoracic Assessment: Within Functional Limits Lumbar Assessment Lumbar Assessment: Within Functional Limits Postural Control Postural Control: Deficits on evaluation Righting Reactions: delayed and inadequate Postural Limitations: decreased  Balance Balance Balance Assessed: Yes Static Sitting Balance Static Sitting - Balance Support: Feet supported Static Sitting - Level of Assistance: 5: Stand by assistance Dynamic Sitting Balance Dynamic Sitting - Balance Support: Feet supported Dynamic Sitting - Level of Assistance: 5: Stand by assistance Static Standing Balance Static Standing - Balance Support: During functional activity Static Standing - Level of Assistance: 4: Min assist Dynamic Standing Balance Dynamic Standing - Balance Support: During functional activity Dynamic Standing - Level of Assistance: 4: Min assist Extremity Assessment      RLE Assessment RLE Assessment: Within Functional Limits LLE Assessment LLE Assessment: Exceptions to [facility] LLE Strength Left Hip Flexion: 4/5 Left Knee Flexion: 4/5 Left Knee Extension: 4/5 Left Ankle Dorsiflexion: 4/5 Left Ankle Plantar Flexion: 4/5    Refer to Care  Plan for Long Term Goals  Recommendations for other services: Neuropsych  Discharge Criteria: Patient will be discharged from PT if patient refuses treatment 3 consecutive times without medical reason, if treatment goals not met, if there is a change in medical status, if patient makes no progress towards goals or if patient is discharged from hospital.  The above assessment, treatment plan, treatment alternatives and goals were discussed and mutually agreed upon: by patient  Tawana Scale, PT, DPT 03/07/2019, 7:55 AM

## 2019-03-07 NOTE — Progress Notes (Signed)
Social Work Assessment and Plan   Patient Details  Name: [name] MRN: [id number] Date of Birth: 1955/12/29  Today's Date: 03/07/2019  Problem List:  Patient Active Problem List   Diagnosis Date Noted  . 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 Left hemiparesis ([place]) 03/06/2019  . Acute cerebrovascular accident (CVA) ([place]) 03/04/2019  . Chest pain 10/16/2017  . HYPERLIPIDEMIA-MIXED 06/16/2008  . TOBACCO ABUSE 06/16/2008  . MARIJUANA ABUSE 06/16/2008  . Cocaine abuse ([place]) 06/16/2008  . CAD, ARTERY BYPASS GRAFT 04/30/2008   Past Medical History:  Past Medical History:  Diagnosis Date  . CAD (coronary artery disease)    a. 04/2008 Cath: LM nl, LAD 50p, 42m, LCX min irregs, RCA 50p/m, 40d, RPDA 60-70ost; b. 04/2008 CABG x 4: LIMA->LAD, VG->D2, VG->RPDA->RPL.  . Cocaine abuse ([place])    a. Quit early 2019.  . Ischemic cardiomyopathy    a. LV gram: EF 30-35%, apical/periapical AK.  . Tobacco abuse    Past Surgical History:  Past Surgical History:  Procedure Laterality Date  . BYPASS GRAFT    . CARDIAC SURGERY     Social History:  reports that he has been smoking cigarettes. He has been smoking about 0.30 packs per day. He uses smokeless tobacco. He reports that he does not drink alcohol or use drugs.  Family / Support Systems Marital Status: Single Patient Roles: Other (Comment)(Friends) Other Supports: Leonel Alexander-Landlord [phone] Anticipated Caregiver: Friends, he will check with them Ability/Limitations of Caregiver: He lives in a boarding house but does not associate with them he has other friends-according to pt who he hopes will help him. Caregiver Availability: Other (Comment)(Pt is working on care-unsure if he will have any help at [place]) Family Dynamics: has local family but has no contact with them. He has friends outside of the boarding home he see's and hangs out with. The residents of the boarding home he is nice but will not depend on  them. His landlord will chekc on him and that is it.  Social History Preferred language: English Religion: None Cultural Background: No issues Education: Pt did not know or share which grade he completed in school. 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 He seems to be compentent knows where he is and why. Will defer to neuro-psych and MD   Abuse/Neglect Abuse/Neglect Assessment Can Be Completed: Yes Physical Abuse: Denies Verbal Abuse: Denies Sexual Abuse: Denies Exploitation of patient/patient's resources: Denies Self-Neglect: Denies  Emotional Status Pt's affect, behavior and adjustment status: Pt is somehwat cranky he wants to sleep and stay in bed and not be bothered all of the time. Discussed he is here on rehab and will have therapy daily. He did not sleep last night due to the boils and the pain he has due to this. He has always been independent and taken care of himself he plans to do this again. Feels just needs a little time to improve. Recent Psychosocial Issues: other health issues-CABG in 2010 Psychiatric History: No history-do feel he will benefit from seeing neuro-psych while here to evaluate compentency and address his substance abuse issues. Will allow him to adjust to the new unit and have him seen  next week. Substance Abuse History: Tobacco denies cocaine although he was positive on admission. He reports he quit but obviously has not. Will work on resources if willing and agreeable  Patient / Family Perceptions, Expectations & Goals Pt/Family understanding of illness & functional limitations: Pt  reports he can not move his side and is weak. He feels if his boils can be dealt with he would be doing better. He does talk with the MD and is aware he has had a stroke. He hopes to get better while here Premorbid pt/family roles/activities: Retiree, friend, boarder, etc Anticipated changes in roles/activities/participation: resume Pt/family expectations/goals: Pt states: " I want to take care of myself like I always have, but want to be left alone."  [employer]: Other (Comment)(resident of a boarding house) Premorbid Home Care/DME Agencies: None Transportation available at discharge: Public or his friends Resource referrals recommended: Neuropsychology  Discharge Planning Living Arrangements: Alone Support Systems: Friends/neighbors Type of Residence: Private residence Insurance Resources: [employer] (specify county) Museum/gallery curator Resources: SSI Financial Screen Referred: Previously completed Living Expenses: Education officer, community Management: Patient Does the patient have any problems obtaining your medications?: No(Doesn;t go to the MD but has insurance to cover his medications) Home Management: Self Patient/Family Preliminary Plans: Plans to return to the boarding house with his other friends helping him if they will. The possibility is real he will have no assist and will need to be mod/i at discharge from rehab to be safe home alone. Will await therapy evaluations and work on safe plan for him. [place] Barriers to Discharge: Medication compliance, Other (comments), Decreased caregiver support Sw Barriers to Discharge Comments: Has very limited support, non-compliant with medications and has an substance abuse issue Social Work Anticipated Follow Up Needs: HH/OP, SNF  Clinical Impression Somewhat cranky gentleman just wants to stay in bed and sleep. He did not sleep last night due to his boils and the pain they are causing. MD is aware and addressing this. He has very limited  supports if none at all. Will ask neuro-psych to see to evaluate competency and address substance abuse issues. Will await evaluations and see goals set to see if will be safe to go home alone when leaves rehab. Pt to check with friends to see how much they can assist him.  Elease Hashimoto 03/07/2019, 11:36 AM

## 2019-03-07 NOTE — Evaluation (Signed)
Occupational Therapy Assessment and Plan  Patient Details  Name: [name] MRN: [id number] Date of Birth: 03/31/1955  OT Diagnosis: abnormal posture, cognitive deficits, hemiplegia affecting non-dominant side and muscle weakness (generalized) Rehab Potential: Rehab Potential (ACUTE ONLY): Good ELOS: 7-10   Today's Date: 03/07/2019 OT Individual Time: 0910-1005 OT Individual Time Calculation (min): 55 min     Problem List:  Patient Active Problem List   Diagnosis Date Noted  . 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 Left hemiparesis ([place]) 03/06/2019  . Acute cerebrovascular accident (CVA) ([place]) 03/04/2019  . Chest pain 10/16/2017  . HYPERLIPIDEMIA-MIXED 06/16/2008  . TOBACCO ABUSE 06/16/2008  . MARIJUANA ABUSE 06/16/2008  . Cocaine abuse ([place]) 06/16/2008  . CAD, ARTERY BYPASS GRAFT 04/30/2008    Past Medical History:  Past Medical History:  Diagnosis Date  . CAD (coronary artery disease)    a. 04/2008 Cath: LM nl, LAD 50p, 62m LCX min irregs, RCA 50p/m, 40d, RPDA 60-70ost; b. 04/2008 CABG x 4: LIMA->LAD, VG->D2, VG->RPDA->RPL.  . [place]    a. Quit early 2019.  . Ischemic cardiomyopathy    a. LV gram: EF 30-35%, apical/periapical AK.  . Tobacco abuse    Past Surgical History:  Past Surgical History:  Procedure Laterality Date  . BYPASS GRAFT    . CARDIAC SURGERY      Assessment & Plan Clinical Impression:  [name] a 63year old right-handed male with history of polysubstance abuse including cocaine, CAD with stenting as well as CABG 2010 maintained on aspirin, ischemic cardiomyopathy ejection fraction of 35%, tobacco abuse and hypertension.  Per chart review he lives alone 1 level apartment.  Independent prior to admission.  He does have local family but no true contact with them.  Presented 03/04/2019 with left-sided weakness x2 days with noted falls due to weakness.  MRI showed multifocal acute ischemia within the right MCA  territory.  No hemorrhage or mass-effect.  Admission chemistries unremarkable, hemoglobin 11.8, alcohol negative, SARS coronavirus negative, urinalysis negative nitrite, urine drug screen positive cocaine.  CT angiogram of head and neck occlusion of mid M1 segment of the right middle cerebral artery.  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  Echocardiogram with ejection fraction of 45% and global hypokinesis.  Neurology consulted maintain on aspirin and Plavix x3 weeks and aspirin alone.  Subcutaneous Lovenox for DVT prophylaxis.  Tolerating a regular diet.  Therapy evaluations completed and patient was admitted for a comprehensive rehab program.  Patient currently requires min with basic self-care skills secondary to muscle weakness, decreased cardiorespiratoy endurance, decreased coordination, decreased attention to left, decreased awareness, decreased problem solving, decreased safety awareness and decreased memory and decreased sitting balance, decreased standing balance, decreased postural control, hemiplegia and decreased balance strategies.  Prior to hospitalization, patient could complete BADL/IADL with independent .  Patient will benefit from skilled intervention to increase independence with basic self-care skills prior to discharge home with care partner.  Anticipate patient will require 24 hour supervision and follow up home health.  OT - End of Session Activity Tolerance: Tolerates 30+ min activity without fatigue Endurance Deficit: Yes OT Assessment Rehab Potential (ACUTE ONLY): Good OT Barriers to Discharge: Decreased caregiver support;Lack of/limited family support;Behavior OT Barriers to Discharge Comments: unsure if 24/7 supervision available at d/c OT Patient demonstrates impairments in the following area(s): Balance;Behavior;Cognition;Endurance;Motor;Perception;Sensory;Safety OT Basic ADL's Functional  Problem(s): Grooming;Bathing;Dressing;Toileting OT Transfers Functional Problem(s): Toilet;Tub/Shower OT Additional Impairment(s): Fuctional Use  of Upper Extremity OT Plan OT Intensity: Minimum of 1-2 x/day, 45 to 90 minutes OT Frequency: 5 out of 7 days OT Duration/Estimated Length of Stay: 7-10 OT Treatment/Interventions: Balance/vestibular training;Discharge planning;Pain management;Self Care/advanced ADL retraining;Therapeutic Activities;UE/LE Coordination activities;Cognitive remediation/compensation;Disease mangement/prevention;Functional mobility training;Patient/family education;Skin care/wound managment;Therapeutic Exercise;Visual/perceptual remediation/compensation;DME/adaptive equipment instruction;Community reintegration;Neuromuscular re-education;Psychosocial support;Splinting/orthotics;UE/LE Strength taining/ROM OT Self Feeding Anticipated Outcome(s): no goal OT Basic Self-Care Anticipated Outcome(s): S OT Toileting Anticipated Outcome(s): S OT Bathroom Transfers Anticipated Outcome(s): S OT Recommendation Patient destination: Home Follow Up Recommendations: Home health OT Equipment Recommended: Tub/shower bench;To be determined Equipment Details: depending on d/c location   Skilled Therapeutic Intervention 1:1. Pt received in w/c agreeable to OT after education on role/purpose, CIR, ELOS, POC and CVA recovery. Pt with deficits in FMC/sensation in LUE impacting ability to complete ADLs and safety d/t mild L inattention evidenced by bumping into doorway on L. Pt completes bathing seated with increased time to sequence bathing body parts and forced use of LUE to wash LEs/RUE. Pt dresses with MIN A in standing and requires A to thread LLE and don L sock. Completes box and blocks as written below. Exited session with pt seated in bed, exit alarm on and call light in reach  OT Evaluation Precautions/Restrictions  Precautions Precautions: Fall Precaution Comments: L  weakness Restrictions Weight Bearing Restrictions: No General Chart Reviewed: Yes Family/Caregiver Present: No Vital Signs   Pain Pain Assessment Pain Score: 0-No pain Home Living/Prior Functioning Home Living Family/patient expects to be discharged to:: Private residence Living Arrangements: Alone Available Help at Discharge: Available PRN/intermittently, Friend(s) Type of Home: House Home Access: Level entry Entrance Stairs-Number of Steps: 2 Home Layout: One level Bathroom Shower/Tub: Tub/shower unit, Door [employer]: Standard Additional Comments: does not drive.  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 ADL Grooming: (refused) Upper Body Bathing: Supervision/safety Where Assessed-Upper Body Bathing: Retail buyer Bathing: Minimal assistance Where Assessed-Lower Body Bathing: Shower Upper Body Dressing: Minimal assistance Where Assessed-Upper Body Dressing: Standing at sink Lower Body Dressing: Minimal assistance Where Assessed-Lower Body Dressing: Standing at sink, Sitting at sink Toileting: Minimal assistance Toilet Transfer: Minimal assistance Toilet Transfer Method: Magazine features editor: Environmental education officer Method: Ambulating Vision Baseline Vision/History: No visual deficits Patient Visual Report: No change from baseline Vision Assessment?: No apparent visual deficits Perception  Perception: Impaired(L inattention) Praxis Praxis: Intact Cognition Overall Cognitive Status: Impaired/Different from baseline Arousal/Alertness: Awake/alert Orientation Level: Person;Place;Situation Person: Oriented Place: Oriented Situation: Oriented Year: 2020 Month: December Day of Week: Incorrect Memory: Impaired Immediate  Memory Recall: Sock;Blue;Bed Memory Recall Sock: Not able to recall Memory Recall Blue: Without Cue Memory Recall Bed: Not able to recall Attention: Sustained Sustained Attention: Appears intact Awareness: Impaired Awareness Impairment: Anticipatory impairment Safety/Judgment: Impaired Sensation Sensation Light Touch: Impaired by gross assessment(LUE numbness) Coordination Gross Motor Movements are Fluid and Coordinated: No Fine Motor Movements are Fluid and Coordinated: No Coordination and Movement Description: box and blocks assessment RUE:36; LUE: 22 Motor  Motor Motor: Hemiplegia Motor - Skilled Clinical Observations: mild L Mobility  Transfers Sit to Stand: Minimal Assistance - Patient > 75% Stand to Sit: Contact Guard/Touching assist  Trunk/Postural Assessment  Cervical Assessment Cervical Assessment: Within Functional Limits Thoracic Assessment Thoracic Assessment: Within Functional Limits Lumbar Assessment Lumbar Assessment: Within Functional Limits Postural Control Postural Control: Deficits on evaluation  Balance Balance Balance Assessed: Yes Dynamic Sitting Balance Dynamic Sitting - Balance Support: Feet supported Dynamic Sitting - Level of Assistance: 5: Stand by assistance Dynamic Standing Balance Dynamic Standing - Balance Support: During functional activity Dynamic Standing - Level of Assistance: 4: Min assist Extremity/Trunk Assessment RUE Assessment RUE Assessment: Within Functional Limits LUE Assessment LUE Assessment: Exceptions to [facility] Active Range of Motion (AROM) Comments: full AROM; 3/5 LUE Body System: Neuro Brunstrum levels for arm and hand: Arm;Hand Brunstrum level for arm: Stage V Relative Independence from Synergy Brunstrum level for hand: Stage VI Isolated joint movements     Refer to Care Plan for Long Term Goals  Recommendations for other services: Neuropsych   Discharge Criteria: Patient will be discharged from OT if patient  refuses treatment 3 consecutive times without medical reason, if treatment goals not met, if there is a change in medical status, if patient makes no progress towards goals or if patient is discharged from hospital.  The above assessment, treatment plan, treatment alternatives and goals were discussed and mutually agreed upon: by patient  Tonny Branch 03/07/2019, 12:22 PM

## 2019-03-07 NOTE — Care Management Note (Signed)
[facility] Individual Statement of Services  Patient Name:  [name]  Date:  03/07/2019  Welcome to the [facility].  Our goal is to provide you with an individualized program based on your diagnosis and situation, designed to meet your specific needs.  With this comprehensive rehabilitation program, you will be expected to participate in at least 3 hours of rehabilitation therapies Monday-Friday, with modified therapy programming on the weekends.  Your rehabilitation program will include the following services:  Physical Therapy (PT), Occupational Therapy (OT), 24 hour per day rehabilitation nursing, Neuropsychology, Case Management (Social Worker), Rehabilitation Medicine, Nutrition Services and Pharmacy Services  Weekly team conferences will be held on Wednesday to discuss your progress.  Your Social Worker will talk with you frequently to get your input and to update you on team discussions.  Team conferences with you and your family in attendance may also be held.  Expected length of stay: 7-10 days  Overall anticipated outcome: supervision with cueing  Depending on your progress and recovery, your program may change. Your Social Worker will coordinate services and will keep you informed of any changes. Your Social Worker's name and contact numbers are listed  below.  The following services may also be recommended but are not provided by the [facility]:    [place] will be made to provide these services after discharge if needed.  Arrangements include referral to agencies that provide these services.  Your insurance has been verified to be:  medicaid Your primary doctor is:  [facility]  Pertinent information will be shared with your doctor and your insurance company.  Social Worker:  Ovidio Kin, [place] or ([phone]  Information discussed with and copy given to patient by: Elease Hashimoto, 03/07/2019, 10:49 AM

## 2019-03-07 NOTE — Evaluation (Addendum)
Speech Language Pathology Assessment and Plan  Patient Details  Name: [name] MRN: [id number] Date of Birth: July 06, 1955  SLP Diagnosis:   n/a - no ST indicated Rehab Potential: Good    Today's Date: 03/07/2019 SLP Individual Time: 1245-8099 SLP Individual Time Calculation (min): 53 min   Problem List:  Patient Active Problem List   Diagnosis Date Noted  . 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 Left hemiparesis ([place]) 03/06/2019  . Acute cerebrovascular accident (CVA) ([place]) 03/04/2019  . Chest pain 10/16/2017  . HYPERLIPIDEMIA-MIXED 06/16/2008  . TOBACCO ABUSE 06/16/2008  . MARIJUANA ABUSE 06/16/2008  . Cocaine abuse ([place]) 06/16/2008  . CAD, ARTERY BYPASS GRAFT 04/30/2008   Past Medical History:  Past Medical History:  Diagnosis Date  . CAD (coronary artery disease)    a. 04/2008 Cath: LM nl, LAD 50p, 48m LCX min irregs, RCA 50p/m, 40d, RPDA 60-70ost; b. 04/2008 CABG x 4: LIMA->LAD, VG->D2, VG->RPDA->RPL.  . [place] ([place]    a. Quit early 2019.  . Ischemic cardiomyopathy    a. LV gram: EF 30-35%, apical/periapical AK.  . Tobacco abuse    Past Surgical History:  Past Surgical History:  Procedure Laterality Date  . BYPASS GRAFT    . CARDIAC SURGERY      Assessment / Plan / Recommendation Clinical Impression   HPI: HLarnie Heart GHayduis a 63year old right-handed male with history of polysubstance abuse including cocaine, CAD with stenting as well as CABG 2010 maintained on aspirin, ischemic cardiomyopathy ejection fraction of 35%, tobacco abuse and hypertension.  Per chart review he lives alone 1 level apartment.  Independent prior to admission.  He does have local family but no true contact with them.  Presented 03/04/2019 with left-sided weakness x2 days with noted falls due to weakness.  MRI showed multifocal acute ischemia within the right MCA territory.  No hemorrhage or mass-effect.  Admission chemistries unremarkable, hemoglobin 11.8,  alcohol negative, SARS coronavirus negative, urinalysis negative nitrite, urine drug screen positive cocaine.  CT angiogram of head and neck occlusion of mid M1 segment of the right middle cerebral artery.  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  Echocardiogram with ejection fraction of 45% and global hypokinesis.  Neurology consulted maintain on aspirin and Plavix x3 weeks and aspirin alone.  Subcutaneous Lovenox for DVT prophylaxis.  Tolerating a regular diet. Pt was admitted to CIR 03/06/19 and SLP evaluations were completed 03/07/19 with results as follows:  Although no family present to confirm, pt presents with cognitive function that is functional for the demands placed on him at home and believed to be at baseline. Per chart review, pt scored 12/22 on MOCA basic yesterday (in acute care venue), and SLP recommended more functional and personally relevant tasks to better assess cognitive function at follow up level of care. Today's evaluation consisted of functional basic and mildly complex money and medication management tasks (organizing QID pill chart), which pt completed with overall Supervision A for problem solving. Medication management required 1 demonstration, then pt completed following tasks with Supervision A. He reported he only has 1 monthly bill which he pays in cash (lives in a boarding house and all utilities are included). He demonstrated how to count count and make set amounts of change using cash with overall Supervision A. When SLP discussed option for tx, pt reported his cognitive function is unchanged by CVA and has friends who can assist him as needed (he  is considering staying with a friend upon d/c), and that he would prefer to spend efforts on PT and OT to work on mobility and regaining use of left hand while in CIR progam. SLP emphasized recommendation for staying with his friend upon d/c so they can provide  24/7 supervision for cognition as opposed to going back to living alone, which pt verbally acknowledged. No skilled ST is indicated at this time.    Skilled Therapeutic Interventions          Cognitive-linguistic evaluation performed and results reviewed with pt (see above for details).   SLP Assessment  Patient does not need any further [facility] [place] Pathology Services    Recommendations  Patient destination: Home Follow up Recommendations: 24 hour supervision/assistance Equipment Recommended: None recommended by SLP                     Pain Pain Assessment Pain Scale: Faces Faces Pain Scale: Hurts a little bit Pain Type: Acute pain Pain Location: Abdomen Pain Orientation: Lower Pain Descriptors / Indicators: Aching Pain Onset: On-going Pain Intervention(s): Emotional support;Other (Comment)(took to restroom, which alleviated some pain) Multiple Pain Sites: No  Prior Functioning Type of Home: House(rents room in boarding home per chart)  Lives With: Alone Available Help at Discharge: Available PRN/intermittently;Friend(s) Vocation: Self employed  SLP Evaluation Cognition Overall Cognitive Status: No family/caregiver present to determine baseline cognitive functioning Arousal/Alertness: Awake/alert Orientation Level: Oriented X4 Attention: Sustained Sustained Attention: Appears intact Memory: Appears intact Decreased Short Term Memory: Functional basic Immediate Memory Recall: Sock;Blue;Bed Memory Recall Sock: Not able to recall Memory Recall Blue: Without Cue Memory Recall Bed: Not able to recall Awareness: Impaired Awareness Impairment: Anticipatory impairment Problem Solving: Impaired Problem Solving Impairment: Functional basic;Verbal basic Safety/Judgment: Impaired  Comprehension Auditory Comprehension Overall Auditory Comprehension: Appears within functional limits for tasks assessed Commands: Within Functional Limits Conversation: Simple Visual  Recognition/Discrimination Discrimination: Not tested Reading Comprehension Reading Status: Within funtional limits Expression Expression Primary Mode of Expression: Verbal Verbal Expression Overall Verbal Expression: Appears within functional limits for tasks assessed Initiation: No impairment Level of Generative/Spontaneous Verbalization: Conversation Repetition: No impairment Naming: No impairment Pragmatics: No impairment Non-Verbal Means of Communication: Not applicable Written Expression Dominant Hand: Right Written Expression: Not tested Oral Motor Oral Motor/Sensory Function Overall Oral Motor/Sensory Function: Within functional limits Motor Speech Overall Motor Speech: Appears within functional limits for tasks assessed Respiration: Within functional limits Phonation: Normal Resonance: Within functional limits Articulation: Within functional limitis Intelligibility: Intelligible Motor Planning: Witnin functional limits Motor Speech Errors: Not applicable   Recommendations for other services: None   Discharge Criteria: Patient will be discharged from SLP if patient refuses treatment 3 consecutive times without medical reason, if treatment goals not met, if there is a change in medical status, if patient makes no progress towards goals or if patient is discharged from hospital.  The above assessment, treatment plan, treatment alternatives and goals were discussed and mutually agreed upon: by patient  Arbutus Leas 03/07/2019, 1:58 PM

## 2019-03-08 ENCOUNTER — Inpatient Hospital Stay (HOSPITAL_COMMUNITY): Payer: Medicaid Other

## 2019-03-08 ENCOUNTER — Inpatient Hospital Stay (HOSPITAL_COMMUNITY): Payer: Medicaid Other | Admitting: Occupational Therapy

## 2019-03-08 ENCOUNTER — Inpatient Hospital Stay (HOSPITAL_COMMUNITY): Payer: Medicaid Other | Admitting: Speech Pathology

## 2019-03-08 DIAGNOSIS — D179 Benign lipomatous neoplasm, unspecified: Secondary | ICD-10-CM

## 2019-03-08 DIAGNOSIS — F1410 Cocaine abuse, uncomplicated: Secondary | ICD-10-CM

## 2019-03-08 DIAGNOSIS — G8194 Hemiplegia, unspecified affecting left nondominant side: Secondary | ICD-10-CM

## 2019-03-08 MED ORDER — DICLOFENAC SODIUM 1 % EX GEL
2.0000 g | Freq: Three times a day (TID) | CUTANEOUS | Status: DC
  Administered 2019-03-08 – 2019-03-12 (×11): 2 g via TOPICAL
  Filled 2019-03-08: qty 100

## 2019-03-08 NOTE — Progress Notes (Signed)
[facility] PHYSICAL MEDICINE & REHABILITATION PROGRESS NOTE   Subjective/Complaints: C/o of cysts which are painful in left groin  ROS: Patient denies fever, rash, sore throat, blurred vision, nausea, vomiting, diarrhea, cough, shortness of breath or chest pain, joint or back pain, headache, or mood change.    Objective:   ECHOCARDIOGRAM LIMITED  Result Date: 03/06/2019   ECHOCARDIOGRAM LIMITED REPORT   Patient Name:   [name] Date of Exam: 03/06/2019 Medical Rec #:  [id number]       Height:       67.0 in Accession #:    [id number]      Weight:       119.1 lb Date of Birth:  [birth year]       BSA:          1.62 m Patient Age:    63 years        BP:           111/79 mmHg Patient Gender: M               HR:           74 bpm. Exam Location:  Inpatient  Procedure: Limited Echo and Intracardiac Opacification Agent Indications:    Stroke 434.91 / I163.9  History:        Patient has prior history of Echocardiogram examinations, most                 recent 03/05/2019. Cocaine abuse.  Sonographer:    Tiffany Dance Referring Phys: [id number] [place]  1. Left ventricular ejection fraction, by visual estimation, is 35 to 40%. The left ventricle has moderately decreased function. There is no left ventricular hypertrophy.  2. The left ventricle demonstrates regional wall motion abnormalities.  3. Global right ventricle has normal systolic function.The right ventricular size is normal.  4. Left atrial size was not assessed.  5. Right atrial size was normal.  6. Moderate pleural effusion.  7. The mitral valve was not assessed.  8. The tricuspid valve is not assessed.  9. The aortic valve was not assessed. 10. The pulmonic valve was not assessed. 11. Aortic root could not be assessed. 12. Limited study; no doppler performed; apical akinesis with moderate LV dysfunction; using definity, no obvious thrombus noted. FINDINGS  Left Ventricle: Left ventricular ejection fraction, by visual estimation, is  35 to 40%. The left ventricle has moderately decreased function. The left ventricle demonstrates regional wall motion abnormalities. Right Ventricle: The right ventricular size is normal. Global RV systolic function is has normal systolic function. Left Atrium: Left atrial size was not assessed. Right Atrium: Right atrial size was normal in size Pericardium: There is no evidence of pericardial effusion. There is a moderate pleural effusion in either the left or right lateral region. Mitral Valve: The mitral valve was not assessed. Tricuspid Valve: The tricuspid valve is not assessed. Aortic Valve: The aortic valve was not assessed. Pulmonic Valve: The pulmonic valve was not assessed. Aorta: Aortic root could not be assessed.  Additional Comments: Limited study; no doppler performed; apical akinesis with moderate LV dysfunction; using definity, no obvious thrombus noted.  Kirk Ruths MD Electronically signed by Kirk Ruths MD Signature Date/Time: 03/06/2019/3:22:00 PM    Final    Recent Labs    03/06/19 0425 03/07/19 0607  WBC 6.2 6.8  HGB 10.4* 11.5*  HCT 31.3* 34.4*  PLT 421* 451*   Recent Labs    03/06/19 0425 03/07/19 0607  NA 136  133*  K 4.0 4.0  CL 105 101  CO2 22 23  GLUCOSE 81 83  BUN 12 10  CREATININE 0.82 0.93  CALCIUM 8.4* 8.8*    Intake/Output Summary (Last 24 hours) at 03/08/2019 1237 Last data filed at 03/08/2019 6644 Gross per 24 hour  Intake 687 ml  Output --  Net 687 ml     Physical Exam: Vital Signs Blood pressure 111/73, pulse 79, temperature 98.4 F (36.9 C), temperature source Oral, resp. rate 17, SpO2 98 %.  Physical Exam  Constitutional: No distress . Vital signs reviewed. HEENT: EOMI, oral membranes moist Neck: supple Cardiovascular: RRR without murmur. No JVD    Respiratory: CTA Bilaterally without wheezes or rales. Normal effort    GI: BS +, non-tender, non-distended  Musculoskeletal:     Cervical back: Normal range of motion and neck  supple.    Neuro: RUE and RLE 5/5- muscles checked- deltoid/biceps/triceps/WE/grip/finger ab/HF/KE/KF/DF/PF LUE- deltoid and biceps 3/5, triceps 3-/5, WE 3-/5, grip 4-/5, finger 4-/5 LLE- HF 2/5, KE/KF 3/5, DF/PF 4/5 Provides his name and age.  Follows simple commands.  He does have difficulty with subtracting as well as addition of simple problems.  Limited awareness of his deficits. Mild L neglect- although said sensation intact on L side and face. --stable exam Skin:  Skin really dry on arms and legs. Left groin with palpable nodule; both are-tender to palpation.  Psychiatric:  Normal affect.    Assessment/Plan: 1. 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  Physiatrist is providing close team supervision and 24 hour management of active medical problems listed below.  Physiatrist and rehab team continue to assess barriers to discharge/monitor patient progress toward functional and medical goals  Care Tool:  Bathing    Body parts bathed by patient: Right arm, Left arm, Chest, Abdomen, Front perineal area, Buttocks, Right upper leg, Left upper leg, Face   Body parts bathed by helper: Right lower leg, Left lower leg     Bathing assist Assist Level: Minimal Assistance - Patient > 75%     Upper Body Dressing/Undressing Upper body dressing   What is the patient wearing?: Pull over shirt    Upper body assist Assist Level: Minimal Assistance - Patient > 75%(standing)    Lower Body Dressing/Undressing Lower body dressing      What is the patient wearing?: Pants     Lower body assist Assist for lower body dressing: Minimal Assistance - Patient > 75%     Toileting Toileting    Toileting assist Assist for toileting: Supervision/Verbal cueing     Transfers Chair/bed transfer  Transfers assist     Chair/bed transfer assist level:  Supervision/Verbal cueing     Locomotion Ambulation   Ambulation assist      Assist level: Minimal Assistance - Patient > 75% Assistive device: No Device Max distance: 198ft   Walk 10 feet activity   Assist     Assist level: Minimal Assistance - Patient > 75% Assistive device: No Device   Walk 50 feet activity   Assist    Assist level: Minimal Assistance - Patient > 75% Assistive device: No Device    Walk 150 feet activity   Assist    Assist level: Minimal Assistance - Patient > 75% Assistive device: No Device    Walk 10 feet on uneven surface  activity   Assist     Assist level:  Minimal Assistance - Patient > 75% Assistive device: Other (comment)(railing)   Wheelchair     Assist Will patient use wheelchair at discharge?: No             Wheelchair 50 feet with 2 turns activity    Assist            Wheelchair 150 feet activity     Assist          Blood pressure 111/73, pulse 79, temperature 98.4 F (36.9 C), temperature source Oral, resp. rate 17, SpO2 98 %.  Medical Problem List and Plan: 1.  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  Antithrombotics: -DVT/anticoagulation: Lovenox             -antiplatelet therapy: Aspirin 81 mg daily and Plavix 75 mg daily x3 weeks then aspirin alone 3. Pain Management: Tylenol as needed 4. Mood: Provide emotional support             -antipsychotic agents: N/A 5. Neuropsych: This patient is? capable of making decisions on his own behalf due to impaired memory and problem solving per OT/PT- has no one else to make decisions- will try to address while in CIR. 6. Skin/Wound Care: Routine skin checks             -ongoing pain from lipomas/nodules in left inguinal area. Both are fairly firm.   -will use  heat, will also add voltaren gel  7. Fluids/Electrolytes/Nutrition: Routine in and outs with follow-up chemistries 8.  Permissive hypertension.  Monitor with increased mobility.  Patient on lisinopril 2.5 mg daily prior to admission.  Resume as needed  12/12: BP stable 9.  History of polysubstance abuse as well as tobacco abuse.  Urine drug screen positive cocaine.  Counseling 10.  Hyperlipidemia.  Lipitor 11. 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     LOS: 2 days A FACE TO FACE EVALUATION WAS PERFORMED  Meredith Staggers 03/08/2019, 12:37 PM

## 2019-03-08 NOTE — Progress Notes (Signed)
Occupational Therapy Session Note  Patient Details  Name: [name] MRN: [id number] Date of Birth: [birth year]  Today's Date: 03/08/2019 OT Individual Time: 1020-1030 OT Individual Time Calculation (min): 10 min  and Today's Date: 03/08/2019 OT Missed Time: 35 Minutes Missed Time Reason: Patient unwilling/refused to participate without medical reason   Short Term Goals: Week 1:  OT Short Term Goal 1 (Week 1): STG=LTG d/t ELOS  Skilled Therapeutic Interventions/Progress Updates:     OT entered room and pt initially agreeable to OT treatment session. Pt stated "as long as you don't ask me any questions." OT told pt this therapists name and pt stated " I know who you are, you made me count money yesterday." OT told pt that was probably with speech therapist Junie Panning, and that he had never worked with this therapist before. Pt became agitated and stated "I know who you are, I saw you yesterday. Stop messing with my mind. I know exactly who you are." OT let pt think whatever he wanted to as to who therapist was, and tried to redirect pt to something he may want to work on in therapy. This worked for a minute and pt said he wanted to work on L arm. Pt then became upset again and stated he did not want to work with this therapist because therapist was "messing with his mind." Pt went on the same tangent again about who therapist was. OT brought in the OT that saw him yesterday in hopes of clearing up misunderstanding, but pt continued to be frustrated and upset. OT explained that it is hard to tell who people are with masks and shields on, but he then became more agitated and verbally abusive to therapist. OT made final attempt to redirect pt to working on functional use of L UE, but agitation and verbal abuse continued. OT left room with bed alarm on and needs met. Pt missed 35 minutes of OT treatment session 2.2 refusal without medical reason.   Therapy Documentation Precautions:   Precautions Precautions: Fall Precaution Comments: L weakness and L inattention Restrictions Weight Bearing Restrictions: No General: General OT Amount of Missed Time: 35 Minutes  Therapy/Group: Individual Therapy  Valma Cava 03/08/2019, 10:43 AM

## 2019-03-08 NOTE — Progress Notes (Signed)
Slept good. Reports left groin feels better. [name] A

## 2019-03-08 NOTE — Progress Notes (Signed)
Physical Therapy Session Note  Patient Details  Name: [name] MRN: [id number] Date of Birth: 06-03-1955  Today's Date: 03/08/2019 PT Individual Time: 0131-4388 PT Individual Time Calculation (min): 70 min   Short Term Goals: Week 1:  PT Short Term Goal 1 (Week 1): = to LTGs based on ELOS Week 2:    Week 3:     Skilled Therapeutic Interventions/Progress Updates:    Pt initially supine.  Rx focus on dual task activities including standing balance, LUE coordination, fine motor skills L hand, and problem solving.   Supine to sit w/cga w/hob elevated.  SPT bed to wc w/min assist for balance, cues for safety and sequencing.  Pt requesting to work on L hand during today's session.  Pt transported to rehab gym for time efficiency. Seated at hi low table, performed grasping and placing clothespins on and removing them from  Rod/box w/cues for attention to task and grasp. Repeated 12x w/L hand. STS from wc and repeated grasping task in standing w/RUE supported on table w/box to pts R / pins to L requiring wt shift L to R , Lhand engaged in grasping task x 12.  Pt required cga only for static balance at table during task. Pt rested in sitting x several min then stood again x 20 min and played checkers w/large propped board using L UE only for maniplating/moving checkers, RUE supported on table. Task required scanning, wt shifting, ROM/coordination/strength challenge for LUE/ LLE strength/standing tolerance/wt shifting and balance. Pt able to execute strategy and follow traditional rules of game until last 2-3 min when he became fatigued and began to make errors.  Did require cues to fully scan board and locate checker in R lower corner of board.  Pt returned to room by therapist.  STS from wc w/cga to [place] and ambulated 277ft w/RW w/cga, leans mildly to L w/fatigue, cues to attend to L/scan L environemnt for obstacles.   Turn/sit transfer to bed w/cga.  Sit to supine w/supervision and verbal cues,  cues to utilize LUE in task.   Pt left supine w/rails up x 3, alarm set, bed in lowest position, and needs in reach.   Therapy Documentation Precautions:  Precautions Precautions: Fall Precaution Comments: L weakness and L inattention Restrictions Weight Bearing Restrictions: No  PAIN LLE "in my hip bone", 8/10.  Repositioned as needed, rest as needed.    Therapy/Group: Individual Therapy Callie Fielding, [place] 03/08/2019, 3:35 PM

## 2019-03-08 NOTE — IPOC Note (Signed)
Overall Plan of Care [facility]) Patient Details Name: [name] MRN: [id number] DOB: 28-Sep-1955  Admitting Diagnosis: Right middle cerebral artery stroke [facility])  Hospital Problems: Principal Problem:   Right middle cerebral artery stroke ([place]) Active Problems:   Cocaine abuse ([place])   CAD, ARTERY BYPASS GRAFT   Left hemiparesis ([facility])     Functional Problem List: Nursing Behavior, Endurance, Nutrition, Skin Integrity  PT Balance, Perception, Behavior, Safety, Edema, Sensory, Endurance, Motor, Nutrition, Pain, Skin Integrity  OT Balance, Behavior, Cognition, Endurance, Motor, Perception, Sensory, Safety  SLP    TR         Basic ADL's: OT Grooming, Bathing, Dressing, Toileting     Advanced  ADL's: OT       Transfers: PT Bed Mobility, Bed to Chair, Car, [employer], Futures trader, Metallurgist: PT Ambulation, Stairs     Additional Impairments: OT Fuctional Use of Upper Extremity  SLP        TR      Anticipated Outcomes Item Anticipated Outcome  Self Feeding no goal  Swallowing      Basic self-care  S  Toileting  S   Bathroom Transfers S  Bowel/Bladder  remain continent of bowel and bladder,  Transfers  supervision  Locomotion  supervision  Communication     Cognition     Pain  Less than 3  Safety/Judgment  remain free of falls, skin breakdown and infection while on rehab   Therapy Plan: PT Intensity: Minimum of 1-2 x/day ,45 to 90 minutes PT Frequency: 5 out of 7 days PT Duration Estimated Length of Stay: 7-10 days OT Intensity: Minimum of 1-2 x/day, 45 to 90 minutes OT Frequency: 5 out of 7 days OT Duration/Estimated Length of Stay: 7-10     Due to the current state of emergency, patients may not be receiving their 3-hours of Medicare-mandated therapy.   Team Interventions: Nursing Interventions Patient/Family Education, Disease Management/Prevention, Medication Management, Discharge Planning, Psychosocial Support  PT  interventions Ambulation/gait training, Community reintegration, DME/adaptive equipment instruction, Neuromuscular re-education, Psychosocial support, Stair training, UE/LE Strength taining/ROM, Training and development officer, Discharge planning, Functional electrical stimulation, Pain management, Skin care/wound management, Therapeutic Activities, UE/LE Coordination activities, Cognitive remediation/compensation, Disease management/prevention, Functional mobility training, Patient/family education, Splinting/orthotics, Therapeutic Exercise, Visual/perceptual remediation/compensation  OT Interventions Balance/vestibular training, Discharge planning, Pain management, Self Care/advanced ADL retraining, Therapeutic Activities, UE/LE Coordination activities, Cognitive remediation/compensation, Disease mangement/prevention, Functional mobility training, Patient/family education, Skin care/wound managment, Therapeutic Exercise, Visual/perceptual remediation/compensation, DME/adaptive equipment instruction, Community reintegration, Neuromuscular re-education, Psychosocial support, Splinting/orthotics, UE/LE Strength taining/ROM  SLP Interventions    TR Interventions    SW/CM Interventions Discharge Planning, Psychosocial Support, Patient/Family Education   Barriers to Discharge MD  Medical stability  Nursing Lack of/limited family support lives in boarding house  PT Decreased caregiver support, Lack of/limited family support, Home environment access/layout    OT Decreased caregiver support, Lack of/limited family support, Behavior unsure if 24/7 supervision available at d/c  SLP Decreased caregiver support    SW Medication compliance, Other (comments), Decreased caregiver support Has very limited support, non-compliant with medications and has an substance abuse issue   Team Discharge Planning: Destination: PT-Home ,OT- Home , SLP-Home Projected Follow-up: PT-Home health PT, 24 hour  supervision/assistance, OT-  Home health OT, SLP-24 hour supervision/assistance Projected Equipment Needs: PT-To be determined, OT- Tub/shower bench, To be determined, SLP-None recommended by SLP Equipment Details: PT- , OT-depending on d/c location Patient/family involved in discharge planning: PT- Patient,  OT-Patient, SLP-Patient  MD ELOS: 7-10 days Medical Rehab Prognosis:  Excellent Assessment: The patient has been admitted for CIR therapies with the diagnosis of right MCA infarct. The team will be addressing functional mobility, strength, stamina, balance, safety, adaptive techniques and equipment, self-care, bowel and bladder mgt, patient and caregiver education, NMR,  communication. Goals have been set at supervision for self-care and mobility.   Due to the current state of emergency, patients may not be receiving their 3 hours per day of Medicare-mandated therapy.    Meredith Staggers, MD, FAAPMR      See Team Conference Notes for weekly updates to the plan of care

## 2019-03-09 ENCOUNTER — Inpatient Hospital Stay (HOSPITAL_COMMUNITY): Payer: Medicaid Other

## 2019-03-09 MED ORDER — METHOCARBAMOL 500 MG PO TABS
500.0000 mg | ORAL_TABLET | Freq: Four times a day (QID) | ORAL | Status: DC | PRN
  Administered 2019-03-10 – 2019-03-14 (×6): 500 mg via ORAL
  Filled 2019-03-09 (×8): qty 1

## 2019-03-09 NOTE — Progress Notes (Signed)
Late entry: After shower with therapy pt doubled over in pain rocking back and forth. Pt unable to verbalize pain level just that "has never felt pain like this before".  Pt went on to explain that was same hip he has falling on twice. Verified xray was done on admit. Gave tylenol. Called Dr Naaman Plummer, informed of pain, no kpad in room, applied heat packs to hip. Rechecked pt, pt noted to be asleep but easy to arouse. Pt reports pain has decreased and shut his eyes back.

## 2019-03-09 NOTE — Progress Notes (Signed)
[place] [facility] PHYSICAL MEDICINE & REHABILITATION PROGRESS NOTE   Subjective/Complaints: Pt states that voltaren helped inguinal area quite a bit. Later when up with therapy developed sudden pain in his left hip. No apparent fall or awkward twist or movement. Pain along lateral thigh  ROS: Patient denies fever, rash, sore throat, blurred vision, nausea, vomiting, diarrhea, cough, shortness of breath or chest pain,  headache, or mood change.    Objective:   No results found. Recent Labs    03/07/19 0607  WBC 6.8  HGB 11.5*  HCT 34.4*  PLT 451*   Recent Labs    03/07/19 0607  NA 133*  K 4.0  CL 101  CO2 23  GLUCOSE 83  BUN 10  CREATININE 0.93  CALCIUM 8.8*    Intake/Output Summary (Last 24 hours) at 03/09/2019 1107 Last data filed at 03/09/2019 0733 Gross per 24 hour  Intake 780 ml  Output --  Net 780 ml     Physical Exam: Vital Signs Blood pressure 107/77, pulse 79, temperature 98.5 F (36.9 C), resp. rate 18, SpO2 97 %.  Physical Exam  Constitutional: No distress . Vital signs reviewed. HEENT: EOMI, oral membranes moist Neck: supple Cardiovascular: RRR without murmur. No JVD    Respiratory: CTA Bilaterally without wheezes or rales. Normal effort    GI: BS +, non-tender, non-distended  Musculoskeletal:   -pain along lateral thigh, below GT, exacerbated with movement especially    Cervical back: Normal range of motion and neck supple.    Neuro: RUE and RLE 5/5- muscles checked- deltoid/biceps/triceps/WE/grip/finger ab/HF/KE/KF/DF/PF LUE- deltoid and biceps 3/5, triceps 3-/5, WE 3-/5, grip 4-/5, finger 4-/5 LLE- HF 2/5, KE/KF 3/5, DF/PF 4/5 Provides his name and age.  Follows simple commands.  He does have difficulty with subtracting as well as addition of simple problems.  Limited awareness of his deficits. Mild L neglect- although said sensation intact on L side and face. --stable exam Skin:    Left groin with palpable nodule; both less tender to palpation.   Psychiatric:  Normal affect.    Assessment/Plan: 1. 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  Physiatrist is providing close team supervision and 24 hour management of active medical problems listed below.  Physiatrist and rehab team continue to assess barriers to discharge/monitor patient progress toward functional and medical goals  Care Tool:  Bathing    Body parts bathed by patient: Right arm, Left arm, Chest, Abdomen, Front perineal area, Buttocks, Right upper leg, Left upper leg, Face   Body parts bathed by helper: Right lower leg, Left lower leg     Bathing assist Assist Level: Minimal Assistance - Patient > 75%     Upper Body Dressing/Undressing Upper body dressing   What is the patient wearing?: Pull over shirt    Upper body assist Assist Level: Minimal Assistance - Patient > 75%(standing)    Lower Body Dressing/Undressing Lower body dressing      What is the patient wearing?: Pants     Lower body assist Assist for lower body dressing: Minimal Assistance - Patient > 75%     Toileting Toileting    Toileting assist Assist for toileting: Minimal Assistance - Patient > 75%     Transfers Chair/bed transfer  Transfers assist     Chair/bed transfer assist level: Minimal Assistance - Patient > 75%     Locomotion Ambulation   Ambulation assist  Assist level: Contact Guard/Touching assist Assistive device: Walker-rolling Max distance: 205   Walk 10 feet activity   Assist     Assist level: Contact Guard/Touching assist Assistive device: Walker-rolling   Walk 50 feet activity   Assist    Assist level: Contact Guard/Touching assist Assistive device: Walker-rolling    Walk 150 feet activity   Assist    Assist level: Contact Guard/Touching assist Assistive device: Walker-rolling    Walk 10 feet  on uneven surface  activity   Assist     Assist level: Contact Guard/Touching assist Assistive device: Walker-rolling   Wheelchair     Assist Will patient use wheelchair at discharge?: No             Wheelchair 50 feet with 2 turns activity    Assist            Wheelchair 150 feet activity     Assist          Blood pressure 107/77, pulse 79, temperature 98.5 F (36.9 C), resp. rate 18, SpO2 97 %.  Medical Problem List and Plan: 1.  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  Antithrombotics: -DVT/anticoagulation: Lovenox             -antiplatelet therapy: Aspirin 81 mg daily and Plavix 75 mg daily x3 weeks then aspirin alone 3. Pain Management: Tylenol as needed  12/13-acute left hip pain. ?muscle strain/spasms   -recent pelvic films negative   -rx heat, robaxin prn   -pain already improving without intervention an hour later 4. Mood: Provide emotional support             -antipsychotic agents: N/A 5. Neuropsych: This patient is? capable of making decisions on his own behalf due to impaired memory and problem solving per OT/PT- has no one else to make decisions- will try to address while in CIR. 6. Skin/Wound Care: Routine skin checks             -ongoing pain from lipomas/nodules in left inguinal area. Both are fairly firm.   12/13-voltaren gel helpful  7. Fluids/Electrolytes/Nutrition: Routine in and outs with follow-up chemistries 8.  Permissive hypertension.  Monitor with increased mobility.  Patient on lisinopril 2.5 mg daily prior to admission.  Resume as needed  12/13: BP stable, soft 9.  History of polysubstance abuse as well as tobacco abuse.  Urine drug screen positive cocaine.  Counseling 10.  Hyperlipidemia.  Lipitor 11. 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     LOS: 3 days A FACE TO FACE EVALUATION WAS PERFORMED  Meredith Staggers 03/09/2019, 11:07 AM

## 2019-03-09 NOTE — Progress Notes (Signed)
Occupational Therapy Session Note  Patient Details  Name: [name] MRN: [id number] Date of Birth: June 01, 1955  Today's Date: 03/09/2019 OT Individual Time: 0730-0800 OT Individual Time Calculation (min): 30 min    Short Term Goals: Week 1:  OT Short Term Goal 1 (Week 1): STG=LTG d/t ELOS  Skilled Therapeutic Interventions/Progress Updates:    1:1. Pt initially went in at scheduled time, however pt just received breakfast and refused to eat EOB or in w/c for better positioning. Pt agreeable to OT returning in 15 min to assist pt with morning ADLs. Upon return, pt reports he is still not finished. Pt becomes flustered when OT insquires about how much longer he needs. He states, "I dont know, and im not going to rush. If you are in a hurry then you can just go on." OT educates on therapy schedule and POC but calmly tells pt she will return in a little while to check on progress. Upon second return pt in bathroom with RN present delivering medication. Pt more agreeable to shower. Pt completes bathing and dressing seated with supervision and forced use to use LUE for all body parts for NMR. Pt reporting still numb. Pt completes dressing at sit to stand from toilet with S overall. Exited session with pt seated in bed exit alarm on and call light in reach  Therapy Documentation Precautions:  Precautions Precautions: Fall Precaution Comments: L weakness and L inattention Restrictions Weight Bearing Restrictions: No General:   Vital Signs: Therapy Vitals Temp: 98.5 F (36.9 C) Pulse Rate: 79 Resp: 18 BP: 107/77 Patient Position (if appropriate): Sitting Oxygen Therapy SpO2: 97 % O2 Device: Room Air Pain:   ADL: ADL Grooming: (refused) Upper Body Bathing: Supervision/safety Where Assessed-Upper Body Bathing: Shower Lower Body Bathing: Minimal assistance Where Assessed-Lower Body Bathing: Shower Upper Body Dressing: Minimal assistance Where Assessed-Upper Body Dressing:  Standing at sink Lower Body Dressing: Minimal assistance Where Assessed-Lower Body Dressing: Standing at sink, Sitting at sink Toileting: Minimal assistance Toilet Transfer: Minimal assistance Toilet Transfer Method: Wellsite geologist Transfer: Minimal assistance Social research officer, government Method: Ambulating Vision   Perception    Praxis   Exercises:   Other Treatments:     Therapy/Group: Individual Therapy  Tonny Branch 03/09/2019, 6:58 AM

## 2019-03-10 ENCOUNTER — Inpatient Hospital Stay (HOSPITAL_COMMUNITY): Payer: Medicaid Other | Admitting: Occupational Therapy

## 2019-03-10 ENCOUNTER — Inpatient Hospital Stay (HOSPITAL_COMMUNITY): Payer: Medicaid Other

## 2019-03-10 DIAGNOSIS — I5042 Chronic combined systolic (congestive) and diastolic (congestive) heart failure: Secondary | ICD-10-CM

## 2019-03-10 DIAGNOSIS — I10 Essential (primary) hypertension: Secondary | ICD-10-CM

## 2019-03-10 DIAGNOSIS — M79605 Pain in left leg: Secondary | ICD-10-CM

## 2019-03-10 DIAGNOSIS — E871 Hypo-osmolality and hyponatremia: Secondary | ICD-10-CM

## 2019-03-10 DIAGNOSIS — I639 Cerebral infarction, unspecified: Secondary | ICD-10-CM

## 2019-03-10 DIAGNOSIS — D62 Acute posthemorrhagic anemia: Secondary | ICD-10-CM

## 2019-03-10 MED ORDER — GABAPENTIN 100 MG PO CAPS
100.0000 mg | ORAL_CAPSULE | Freq: Three times a day (TID) | ORAL | Status: DC
  Administered 2019-03-10 – 2019-03-15 (×14): 100 mg via ORAL
  Filled 2019-03-10 (×16): qty 1

## 2019-03-10 NOTE — Progress Notes (Signed)
Occupational Therapy Session Note  Patient Details  Name: [name] MRN: [id number] Date of Birth: 07/07/1955  Today's Date: 03/10/2019 OT Individual Time: 1415-1500 OT Individual Time Calculation (min): 45 min    Short Term Goals: Week 1:  OT Short Term Goal 1 (Week 1): STG=LTG d/t ELOS  Skilled Therapeutic Interventions/Progress Updates:    Treatment session with focus on functional mobility and LUE NMR.  Pt received semi-reclined in bed reporting hip had been bothering him overnight and this AM but no pain at this time.  Ambulated to therapy gym with RW per pt request to alleviate hip pain with mobility.  Engaged in [place] in sitting and standing with focus on gross and fine motor control with stacking small items, picking up items to replicate pattern, and reaching across midline and outside BOS to challenge gross motor and balance reactions.  CGA when reaching outside BOS.  Pt asking questions about recovery time in regards to LUE.  Returned to room as above and left upright on EOB with RN present.  Therapy Documentation Precautions:  Precautions Precautions: Fall Precaution Comments: L weakness and L inattention Restrictions Weight Bearing Restrictions: No General:   Vital Signs: Therapy Vitals Temp: 98.3 F (36.8 C) Pulse Rate: 84 Resp: 19 BP: 98/66 Patient Position (if appropriate): Lying Oxygen Therapy SpO2: 98 % O2 Device: Room Air Pain: Pt with no c/o pain   Therapy/Group: Individual Therapy  Simonne Come 03/10/2019, 3:02 PM

## 2019-03-10 NOTE — Progress Notes (Signed)
At 6:58 This writer was notified to come into the patient room. upom arrival this nurse seen the patient stitting on his buttocks, on the right side of his bed. This Probation officer asked him what he was doing and he informed me " he was sitting on the floor because his left hip was hurting." when asked why he replied "the bed was to soft and he needed to sit on a hard surface."  This Probation officer and nurse tech assisted patient into the chair in the room and pain medications given as ordered. Adria Devon, LPN

## 2019-03-10 NOTE — Progress Notes (Signed)
Physical Therapy Session Note  Patient Details  Name: [name] MRN: [id number] Date of Birth: 26-Feb-1956  Today's Date: 03/10/2019 PT Individual Time: 1600-1640 PT Individual Time Calculation (min): 40 min    Short Term Goals: Week 1:  PT Short Term Goal 1 (Week 1): = to LTGs based on ELOS  Skilled Therapeutic Interventions/Progress Updates:    Pt using the bathroom upon PT arrival, agreeable to therapy tx and denies pain. Pt performed clothing management and pericare without assist, ambulated to sink without AD and CGA to wash hands. Supervision for standing balance at the sink while washing hands. Therapist asked pt to try ambulating to gym without AD, pt becomes frustrated and upset "No, I'm not doing that, I need the walker." Therapist allowed pt to ambulate to the gym with RW and supervision-CGA x 150 ft, therapist asking if pt is planning to use a RW at home. He becomes angry again and says "no I don't need no walker at home." Pt repors he only wants to work on his L UE this afternoon. Pt worked on L UE coordination and strength to perform clothespin task while standing at table. Pt worked on L UE NMR and coordination to perform task cutting paper with scissor to try to make a circle. Standing at table pt worked on L UE NMR to complete PVC pipe puzzle, cues to correctly complete following the picture. Pt ambulated back to room with RW and supervision, reports having to try using bathroom again. Ambulated in/out of bathroom without AD and CGA this session, continent of bowel. Standing at sink to wash hands, transferred to bed, left in bed with needs in reach and bed alarm set.   Therapy Documentation Precautions:  Precautions Precautions: Fall Precaution Comments: L weakness and L inattention Restrictions Weight Bearing Restrictions: No    Therapy/Group: Individual Therapy  Netta Corrigan, PT, DPT, CSRS 03/10/2019, 7:56 AM

## 2019-03-10 NOTE — Progress Notes (Signed)
Pt was given the K pad for L Hip pain 7/10. Pt states no pain at this time with it in place. Pt refused topical gel and PRN medication. Pt resting in bed at this time. No distress noted.

## 2019-03-10 NOTE — Progress Notes (Signed)
Occupational Therapy Session Note  Patient Details  Name: [name] MRN: [id number] Date of Birth: 04-03-1955  Today's Date: 03/10/2019 OT Individual Time: 1050-1200 OT Individual Time Calculation (min): 70 min    Short Term Goals: Week 1:  OT Short Term Goal 1 (Week 1): STG=LTG d/t ELOS  Skilled Therapeutic Interventions/Progress Updates:    Pt received in bed stating his L hip pain was much improved as he had a "bunch of medicine to help". Pt with positive attitude and participated extremely well this session.   He said he is seriously considering staying with his cousin for awhile after discharge.   He wanted to wait until tomorrow to shower as he had just put on new scrubs this morning.  Due to hip pain, used RW to ambulate to gym with CGA.  Pt tolerated walking well.  In gym, reassessed strength.  His L grip is 50 lbs and his R grip is 60 lbs.  He has 4+/5 in shoulder and elbow, but demonstrates incoordination of GM movements with exercises like reaching back to touch his head.  Worked on [employer] exercises to focus on bimanual coordination: - zoom ball  - "baseball bounce" using 2# dowel bar with hitting beach ball back -bounce and catch gym ball -simulated hammer and nail ex using   Small wood pegs and "hammering" with a 2# dumbbell. -2# dowel bar exercises -reaching and picking up a laundry basket  Began to work on squats but it irritated his hip.    Pt very cold in gym, walked back to room with CGA with RW.  Pt wanted to get back in bed. Pt resting in bed with alarm set and all needs met.       Therapy Documentation Precautions:  Precautions Precautions: Fall Precaution Comments: L weakness and L inattention Restrictions Weight Bearing Restrictions: No       Pain: Pain Assessment Pain Scale: 0-10 Pain Score: 4  Pain Type: Acute pain Pain Location: Hip Pain Orientation: Left Pain Descriptors / Indicators: Sharp Pain Onset: On-going Pain  Intervention(s): Heat applied Multiple Pain Sites: No ADL: ADL Grooming: (refused) Upper Body Bathing: Supervision/safety Where Assessed-Upper Body Bathing: Shower Lower Body Bathing: Minimal assistance Where Assessed-Lower Body Bathing: Shower Upper Body Dressing: Minimal assistance Where Assessed-Upper Body Dressing: Standing at sink Lower Body Dressing: Minimal assistance Where Assessed-Lower Body Dressing: Standing at sink, Sitting at sink Toileting: Minimal assistance Toilet Transfer: Minimal assistance Toilet Transfer Method: Wellsite geologist Transfer: Minimal assistance Social research officer, government Method: Ambulating   Therapy/Group: Individual Therapy  Rajinder Mesick 03/10/2019, 12:43 PM

## 2019-03-10 NOTE — Progress Notes (Signed)
[place] PHYSICAL MEDICINE & REHABILITATION PROGRESS NOTE   Subjective/Complaints: Patient seen sitting up in bed this morning.  He states he complains of left leg pain and does not want to discuss anything else.  He states he needs medications right away.  ROS: Denies CP, SOB, N/V/D  Objective:   No results found. No results for input(s): WBC, HGB, HCT, PLT in the last 72 hours. No results for input(s): NA, K, CL, CO2, GLUCOSE, BUN, CREATININE, CALCIUM in the last 72 hours.  Intake/Output Summary (Last 24 hours) at 03/10/2019 1610 Last data filed at 03/10/2019 1330 Gross per 24 hour  Intake 676 ml  Output -  Net 676 ml     Physical Exam: Vital Signs Blood pressure 98/66, pulse 84, temperature 98.3 F (36.8 C), resp. rate 19, SpO2 98 %. Constitutional: No distress . Vital signs reviewed. HENT: Normocephalic.  Atraumatic. Eyes: EOMI. No discharge. Cardiovascular: No JVD. Respiratory: Normal effort.  No stridor. GI: Non-distended. Skin: Warm and dry.  Intact. Psych: Normal mood.  Normal behavior. Musc: Left leg TTP Neuro: Alert Motor: RUE/RLE: 5/5 proximal distal LUE: Grossly 4 -/5 proximal distal LLE: Grossly 4 -/5 proximal to distal (pain inhibition)   Assessment/Plan: 1. 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  Physiatrist is providing close team supervision and 24 hour management of active medical problems listed below.  Physiatrist and rehab team continue to assess barriers to discharge/monitor patient progress toward functional and medical goals  Care Tool:  Bathing    Body parts bathed by patient: Right arm, Left arm, Chest, Abdomen, Front perineal area, Buttocks, Right upper leg, Left upper leg, Face   Body parts bathed by helper: Right lower leg, Left lower leg     Bathing assist Assist Level: Minimal Assistance - Patient  > 75%     Upper Body Dressing/Undressing Upper body dressing   What is the patient wearing?: Pull over shirt    Upper body assist Assist Level: Minimal Assistance - Patient > 75%(standing)    Lower Body Dressing/Undressing Lower body dressing      What is the patient wearing?: Pants     Lower body assist Assist for lower body dressing: Minimal Assistance - Patient > 75%     Toileting Toileting    Toileting assist Assist for toileting: Minimal Assistance - Patient > 75%     Transfers Chair/bed transfer  Transfers assist     Chair/bed transfer assist level: Supervision/Verbal cueing     Locomotion Ambulation   Ambulation assist      Assist level: Supervision/Verbal cueing Assistive device: Walker-rolling Max distance: 300'   Walk 10 feet activity   Assist     Assist level: Supervision/Verbal cueing Assistive device: Walker-rolling   Walk 50 feet activity   Assist    Assist level: Supervision/Verbal cueing Assistive device: Walker-rolling    Walk 150 feet activity   Assist    Assist level: Supervision/Verbal cueing Assistive device: Walker-rolling    Walk 10 feet on uneven surface  activity   Assist     Assist level: Contact Guard/Touching assist Assistive device: Aeronautical engineer Will patient use wheelchair at discharge?: No             Wheelchair 50 feet with 2 turns activity    Assist            Wheelchair 150 feet  activity     Assist          Blood pressure 98/66, pulse 84, temperature 98.3 F (36.8 C), resp. rate 19, SpO2 98 %.  Medical Problem List and Plan: 1.  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  Antithrombotics: -DVT/anticoagulation: Lovenox             -antiplatelet therapy: Aspirin 81 mg daily and Plavix 75 mg daily x3 weeks then aspirin alone 3. Pain Management: Tylenol as  needed  Gabapentin 100 3 times daily started on 12/14 4. Mood: Provide emotional support             -antipsychotic agents: N/A 5. Neuropsych: This patient is ? capable of making decisions on his own behalf due to impaired memory and problem solving per OT/PT- has no one else to make decisions- will try to address while in CIR. 6. Skin/Wound Care: Routine skin checks 7. Fluids/Electrolytes/Nutrition: Routine in and outs with follow-up chemistries 8.  Essential hypertension.  Monitor with increased mobility.  Patient on lisinopril 2.5 mg daily prior to admission.  Resume as needed  Relatively soft on 12/14 9.  History of polysubstance abuse as well as tobacco abuse.  Urine drug screen positive cocaine.    Counsel 10.  Hyperlipidemia.  Lipitor 11. 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  12.  Hyponatremia  Sodium 133 on 12/11  Continue to monitor 13.  Acute blood loss anemia  Hemoglobin 11.5 on 12/11  Continue to monitor  LOS: 4 days A FACE TO FACE EVALUATION WAS PERFORMED  [name] 03/10/2019, 4:10 PM

## 2019-03-10 NOTE — Progress Notes (Signed)
Physical Therapy Session Note  Patient Details  Name: [name] MRN: [id number] Date of Birth: 09/14/1955  Today's Date: 03/10/2019 PT Individual Time: 0929-0959 PT Individual Time Calculation (min): 30 min   Short Term Goals: Week 1:  PT Short Term Goal 1 (Week 1): = to LTGs based on ELOS  Skilled Therapeutic Interventions/Progress Updates:   Pt reporting his LLE pain has been really limiting as well as not being able to go to the bathroom. Suggested getting up and moving around to see if bowels could be stimulated (pt already drank prune juice per report). Pt agreeable and request to change pants. Pt doffed pants and donned new ones with close supervision for sit <> stands and standing balance. Functional gait training on unit with RW at overall supervision level, CGA without AD occasionally when pt would leave walker in a spot > 150' without rest breaks needed. Stair negotiation training for functional mobility and home access x 8 steps with supervision using rails and self selected reciprocal pattern. Returned to room to attempt ot use bathroom with supervsiion for transfers and + void but no BM. Returned to bed to rest and performed bed mobility independently. Pt grateful for ability to get up and move this morning.   Therapy Documentation Precautions:  Precautions Precautions: Fall Precaution Comments: L weakness and L inattention Restrictions Weight Bearing Restrictions: No Pain: c/o 7/10 LLE/hip pain - premedicated per report and using heating pad. Emotional support provided   Therapy/Group: Individual Therapy  Canary Brim Ivory Broad, PT, DPT, CBIS  03/10/2019, 10:05 AM

## 2019-03-10 NOTE — Plan of Care (Signed)
  Problem: Consults Goal: RH STROKE PATIENT EDUCATION Description: See Patient Education module for education specifics  Outcome: Progressing   Problem: RH SKIN INTEGRITY Goal: RH STG SKIN FREE OF INFECTION/BREAKDOWN Description: Free of breakdown with min assist Outcome: Progressing   Problem: RH SAFETY Goal: RH STG ADHERE TO SAFETY PRECAUTIONS W/ASSISTANCE/DEVICE Description: STG Adhere to Safety Precautions With Assistance/Device. Outcome: Progressing   Problem: RH KNOWLEDGE DEFICIT Goal: RH STG INCREASE KNOWLEDGE OF HYPERTENSION Description: Patient able to describe medications to manage blood pressure with cues/handouts Outcome: Progressing Goal: RH STG INCREASE KNOWLEDGE OF STROKE PROPHYLAXIS Description: Patient able to verbalize prevention of stroke including diet, exercise, lifestyle modifications, and medication management Outcome: Progressing

## 2019-03-11 ENCOUNTER — Inpatient Hospital Stay (HOSPITAL_COMMUNITY): Payer: Medicaid Other

## 2019-03-11 ENCOUNTER — Inpatient Hospital Stay (HOSPITAL_COMMUNITY): Payer: Medicaid Other | Admitting: Occupational Therapy

## 2019-03-11 NOTE — Progress Notes (Signed)
[facility] PHYSICAL MEDICINE & REHABILITATION PROGRESS NOTE   Subjective/Complaints: Patient seen laying in bed this morning.  He states he slept well overnight and wants to sleep more.  He states he does want to be bothered asks me to leave.  ROS: Denies CP, SOB, N/V/D  Objective:   No results found. No results for input(s): WBC, HGB, HCT, PLT in the last 72 hours. No results for input(s): NA, K, CL, CO2, GLUCOSE, BUN, CREATININE, CALCIUM in the last 72 hours.  Intake/Output Summary (Last 24 hours) at 03/11/2019 0924 Last data filed at 03/10/2019 1815 Gross per 24 hour  Intake 472 ml  Output -  Net 472 ml     Physical Exam: Vital Signs Blood pressure 92/71, pulse 80, temperature 97.9 F (36.6 C), temperature source Oral, resp. rate 16, SpO2 100 %. Constitutional: No distress . Vital signs reviewed. HENT: Normocephalic.  Atraumatic. Eyes: EOMI. No discharge. Cardiovascular: No JVD. Respiratory: Normal effort.  No stridor. GI: Non-distended. Skin: Warm and dry.  Intact. Psych: Agitated Musc: No edema in extremities.  No tenderness in extremities. Neuro: Alert Motor:  LUE: Grossly 4 -/5 proximal distal LLE: Grossly 4 -/5 proximal to distal, unchanged  Assessment/Plan: 1. 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  Physiatrist is providing close team supervision and 24 hour management of active medical problems listed below.  Physiatrist and rehab team continue to assess barriers to discharge/monitor patient progress toward functional and medical goals  Care Tool:  Bathing    Body parts bathed by patient: Right arm, Left arm, Chest, Abdomen, Front perineal area, Buttocks, Right upper leg, Left upper leg, Face   Body parts bathed by helper: Right lower leg, Left lower leg     Bathing assist Assist Level: Minimal Assistance - Patient >  75%     Upper Body Dressing/Undressing Upper body dressing   What is the patient wearing?: Pull over shirt    Upper body assist Assist Level: Minimal Assistance - Patient > 75%(standing)    Lower Body Dressing/Undressing Lower body dressing      What is the patient wearing?: Pants     Lower body assist Assist for lower body dressing: Minimal Assistance - Patient > 75%     Toileting Toileting    Toileting assist Assist for toileting: Minimal Assistance - Patient > 75%     Transfers Chair/bed transfer  Transfers assist     Chair/bed transfer assist level: Supervision/Verbal cueing     Locomotion Ambulation   Ambulation assist      Assist level: Supervision/Verbal cueing Assistive device: Walker-rolling Max distance: 150 ft   Walk 10 feet activity   Assist     Assist level: Supervision/Verbal cueing Assistive device: Walker-rolling   Walk 50 feet activity   Assist    Assist level: Supervision/Verbal cueing Assistive device: Walker-rolling    Walk 150 feet activity   Assist    Assist level: Supervision/Verbal cueing Assistive device: Walker-rolling    Walk 10 feet on uneven surface  activity   Assist     Assist level: Contact Guard/Touching assist Assistive device: Aeronautical engineer Will patient use wheelchair at discharge?: No             Wheelchair 50 feet with 2 turns activity    Assist            Wheelchair 150 feet  activity     Assist          Blood pressure 92/71, pulse 80, temperature 97.9 F (36.6 C), temperature source Oral, resp. rate 16, SpO2 100 %.  Medical Problem List and Plan: 1.  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  Antithrombotics: -DVT/anticoagulation: Lovenox             -antiplatelet therapy: Aspirin 81 mg daily and Plavix 75 mg daily x3 weeks then aspirin alone 3. Pain  Management: Tylenol as needed  Gabapentin 100 3 times daily started on 12/14  Appears improved on 12/15 with medications 4. Mood: Provide emotional support             -antipsychotic agents: N/A 5. Neuropsych: This patient is ? capable of making decisions on his own behalf due to impaired memory and problem solving per OT/PT- has no one else to make decisions- will try to address while in CIR. 6. Skin/Wound Care: Routine skin checks 7. Fluids/Electrolytes/Nutrition: Routine in and outs with follow-up chemistries 8.  Essential hypertension.  Monitor with increased mobility.  Patient on lisinopril 2.5 mg daily prior to admission.  Resume as needed  Relatively soft on 12/15 9.  History of polysubstance abuse as well as tobacco abuse.  Urine drug screen positive cocaine.    Counsel 10.  Hyperlipidemia.  Lipitor 11. 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  12.  Hyponatremia  Sodium 133 on 12/11, plan to order labs later this week  Continue to monitor 13.  Acute blood loss anemia  Hemoglobin 11.5 on 12/11  Continue to monitor  LOS: 5 days A FACE TO FACE EVALUATION WAS PERFORMED   Lorie Phenix 03/11/2019, 9:24 AM

## 2019-03-11 NOTE — Progress Notes (Signed)
Occupational Therapy Session Note  Patient Details  Name: [name] MRN: [id number] Date of Birth: 17-Mar-1956  Today's Date: 03/11/2019 OT Individual Time: 0950-1100 OT Individual Time Calculation (min): 70 min    Short Term Goals: Week 1:  OT Short Term Goal 1 (Week 1): STG=LTG d/t ELOS  Skilled Therapeutic Interventions/Progress Updates:    Treatment session with focus on functional mobility and LUE NMR.  Pt received supine in bed requiring increased time to fully arouse to participate.  Pt initially attempting to refuse therapy stating "y'all can't keep coming like this" and "I need to rest".  When encouraged to engage in therapy in room to focus on LUE, pt in agreement.  Pt states that he needs to get his Lt hand back.  Ambulated to toilet without AD with supervision.  Pt completed void in standing with distant supervision.  Pt declined shower this session due to hip pain, declining anything to attempt to alleviate pain at this time.  Engaged in [place] in sitting with focus on fine and gross motor control.  Utilized resistive clothes pins with focus on various pinch grasps, pt with tendency to utilize gross grasp.  Incorporated reaching with clothespins to challenge gross motor aspect as well.  Engaged in table top task with peg boards incorporating use of dice and cognitive challenge to follow directions.  Pt dropped items ~20% of times, noted frustration but pt continued to engage in therapy session.  Pt returned to bed at end of session as pt refusing chair alarm.  Reiterated safety plan and hospital policy.  Pt left in supine in bed with kpad for pain relief and all needs in reach.  Therapy Documentation Precautions:  Precautions Precautions: Fall Precaution Comments: L weakness and L inattention Restrictions Weight Bearing Restrictions: No Pain: Pt with c/o pain 7/10 in Lt hip.  Declined pain intervention at this time.  Therapy/Group: Individual Therapy  Simonne Come 03/11/2019, 12:25 PM

## 2019-03-11 NOTE — Progress Notes (Signed)
Pt has rested well through out the night. Pt denies pain and confirms the K pad is still helping the L hip pain. Pt resting in bed at this time

## 2019-03-11 NOTE — Progress Notes (Addendum)
Physical Therapy Session Note  Patient Details  Name: [name] MRN: [id number] Date of Birth: May 18, 1955  Today's Date: 03/11/2019 PT Individual Time: 9562-1308  And 1303-1401  PT Individual Time Calculation (min): 46 min and 58 min  Short Term Goals: Week 1:  PT Short Term Goal 1 (Week 1): = to LTGs based on ELOS Week 2:    Week 3:     Skilled Therapeutic Interventions/Progress Updates:      Therapy Documentation Precautions:  Precautions Precautions: Fall Precaution Comments: L weakness and L inattention Restrictions Weight Bearing Restrictions: No     AM Session:  PAIN pt initially declines pain, but during session frequently c/o L hip pain, will not give number "it hurts Im telling you".  Treatment to tolerance.  Soft tissue mobilization of IT band performed.  Pt refused to cooperate w/stretching stating "You dont listen, I told you I tried stretching in the bed and it doesn't work".  Pt initially supine, agreeable to treatment but refuses to allow therapist to use gait belt despite ed/falls risk, pt becomes agitated w/discussion.  Supine to sit and STS  From bed w/supervision.  Pt ambulated 54ft to dresser where he stood w/supervision and opened drawer and using bilat hands removed $1 from his jeans pocket for vending.  Gait 165ft without AD w/cga, antalgic gait due to Lhip pain.  Pt able to enter $ and proper number for his choice from vending machine maintains balance w/supervision while retrieving crackers from low drawer.   Dynamic balance and bilat UE coordination activities performed including: Pt stood at [employer] and threw/caught 5inch ball then full sized basketball w/bilat hands x 77min, 67min, uses Lhand>R to propel ball stating he plays baskeball ambidextriously.    cornhole using L hand to throw beanbags from 38ft w/cga x 10reps then retrieves all beanbags from floor including stepping around platform all w/cga for balance.  Repeated task x 3 w/improved  accuracy w/repitition.  Pt becomes agitated w/therapist guarding him but this is necessary for safety.  Gait 264ft to room w/cga w/single episode of balance loss requiring min assist for recovery during gait.  Pt spontaneously stops/starts and scans environment during gait.    Pt transferred to bed w/cga, sit to supine w/supervision. Pt left supine w/rails up x 3, alarm set, bed in lowest position, and needs in reach.   PM Session:  PAIN initially c/o L hip but would not quantify, rated pain  7/10 L hip following stairs  Pt supine to sit w/supervision.  STS to RW w/supervision.  Gait 161ft w/RW and CGA.   Stairs:  Ascends/descends 4 5in steps and 8 3in steps w/2 rails and close supervision, descends step to, ascends step over step.  Pt began c/o hip pain.  Agreeable to seated acttivity.    Performed manual dexterity task of removing various sized bolts from bolt box holes/turning bolts w/L hand, stabilizing w/R.  Verbal cues for sequencing.    Peg board also for UE coordination/dexterity placed 20 medium pegs and removed 20 pegs from peg board holes using L hand  Clothes pin box w/12 pins/4 each level using L hand only to place and remove/ working on grasp strength/coordination.  Repeated x 2  Gait 160ft w/RW and cga, slow antalgic gait, stood w/walker x 2 min while therapist spoke w/nursing requesting pain meds.  Gait 143ft w/Rw increasingly decreased cadence and cga, antalgic gait.  Gait 29ft to/from  commode including uneven transition at doorway without AD and cga for balance.  Stood at commode x 2 min w/supervsion to urinate.  Stood at sink x 2 min and washed and dried hands w/supervision.  Pt stood at bedside and organized belongings on nightstand and bed w/supervision.  Transferred to edge of bed w/ supervision.  Nursing arrived and provided pt w/pain meds and pt sat at edge of bed w/supervision only,  and then sit to supine w/supervision.   Pt left supine w/rails up x 3, alarm set, bed  in lowest position, and needs in reach.     Therapy/Group: Individual Therapy  Callie Fielding, [place] 03/11/2019, 7:50 AM

## 2019-03-12 ENCOUNTER — Inpatient Hospital Stay (HOSPITAL_COMMUNITY): Payer: Medicaid Other | Admitting: Physical Therapy

## 2019-03-12 ENCOUNTER — Encounter (HOSPITAL_COMMUNITY): Payer: Medicaid Other | Admitting: Psychology

## 2019-03-12 ENCOUNTER — Inpatient Hospital Stay (HOSPITAL_COMMUNITY): Payer: Medicaid Other

## 2019-03-12 DIAGNOSIS — M7062 Trochanteric bursitis, left hip: Secondary | ICD-10-CM

## 2019-03-12 MED ORDER — LIDOCAINE 5 % EX PTCH
1.0000 | MEDICATED_PATCH | CUTANEOUS | Status: DC
  Administered 2019-03-12 – 2019-03-14 (×3): 1 via TRANSDERMAL
  Filled 2019-03-12 (×3): qty 1

## 2019-03-12 MED ORDER — ADULT MULTIVITAMIN W/MINERALS CH
1.0000 | ORAL_TABLET | Freq: Every day | ORAL | Status: DC
  Administered 2019-03-13 – 2019-03-15 (×3): 1 via ORAL
  Filled 2019-03-12 (×3): qty 1

## 2019-03-12 MED ORDER — ENSURE ENLIVE PO LIQD
237.0000 mL | Freq: Two times a day (BID) | ORAL | Status: DC
  Administered 2019-03-12 – 2019-03-14 (×5): 237 mL via ORAL

## 2019-03-12 MED ORDER — DICLOFENAC SODIUM 1 % EX GEL
2.0000 g | Freq: Four times a day (QID) | CUTANEOUS | Status: DC
  Administered 2019-03-12 – 2019-03-14 (×10): 2 g via TOPICAL
  Filled 2019-03-12: qty 100

## 2019-03-12 NOTE — Progress Notes (Signed)
Social Work Patient ID: [name], male   DOB: 09-Sep-1955, 63 y.o.   MRN: [id number]  Met with pt to inform of team conference goals supervision-mod/i and discharge 12/19. He feels he is doing better as long as his hip pain is managed. He does not want any equipment-ie walker or bathroom equipment. He would like a heating pad if can get coverage for one. He will work on getting himself a ride for Sat. Discussed follow up therapy he will think about and let this worker know.

## 2019-03-12 NOTE — Progress Notes (Signed)
Occupational Therapy Session Note  Patient Details  Name: [name] MRN: [id number] Date of Birth: 12-15-1955  Today's Date: 03/12/2019 OT Individual Time: 1400-1500 OT Individual Time Calculation (min): 60 min    Short Term Goals: Week 1:  OT Short Term Goal 1 (Week 1): STG=LTG d/t ELOS  Skilled Therapeutic Interventions/Progress Updates:    1;1. Pt agreeable to therapy. Pt completes ambulation with RW to/from all tx destinations with S. Pt completes seated box and blocks 24 with LUE. Pt completes seated ball toss (chest, bounce and overhead pass) with 3# wrist weights on BUE 3x30 reps with VC for technqiue for BUE coordination and strengthening. Pt completes palm<>finger translation of coins iwht VC for decreasing compensatory strategies and visual fixation on coins d/t decreased sensation at fingertips. Exited session with pt seated in bed, exited session with call light in reach and all needs met  Therapy Documentation Precautions:  Precautions Precautions: Fall Precaution Comments: L weakness and L inattention Restrictions Weight Bearing Restrictions: No General:   Vital Signs:   Pain: Pain Assessment Pain Scale: 0-10 Pain Score: 0-No pain Faces Pain Scale: No hurt ADL: ADL Grooming: (refused) Upper Body Bathing: Supervision/safety Where Assessed-Upper Body Bathing: Shower Lower Body Bathing: Minimal assistance Where Assessed-Lower Body Bathing: Shower Upper Body Dressing: Minimal assistance Where Assessed-Upper Body Dressing: Standing at sink Lower Body Dressing: Minimal assistance Where Assessed-Lower Body Dressing: Standing at sink, Sitting at sink Toileting: Minimal assistance Toilet Transfer: Minimal assistance Toilet Transfer Method: Wellsite geologist Transfer: Minimal assistance Social research officer, government Method: Ambulating Vision   Perception    Praxis   Exercises:   Other Treatments:     Therapy/Group: Individual Therapy  Tonny Branch 03/12/2019, 3:00 PM

## 2019-03-12 NOTE — Progress Notes (Signed)
[facility] PHYSICAL MEDICINE & REHABILITATION PROGRESS NOTE   Subjective/Complaints: Patient seen laying in bed this morning.  He states he slept well overnight.  He complains of left hip pain.  He is agitated and states he does not want to speak to me, answer questions (including his hip pain), perform any muscle testing.  Therapy is attempting to work with patient, discussed pain with therapies.  ROS: Left hip pain.  Denies CP, SOB, N/V/D  Objective:   No results found. No results for input(s): WBC, HGB, HCT, PLT in the last 72 hours. No results for input(s): NA, K, CL, CO2, GLUCOSE, BUN, CREATININE, CALCIUM in the last 72 hours.  Intake/Output Summary (Last 24 hours) at 03/12/2019 1120 Last data filed at 03/12/2019 0730 Gross per 24 hour  Intake 440 ml  Output -  Net 440 ml     Physical Exam: Vital Signs Blood pressure 96/79, pulse 74, temperature 98.4 F (36.9 C), temperature source Oral, resp. rate 16, weight 52.1 kg, SpO2 99 %. Constitutional: No distress . Vital signs reviewed. HENT: Normocephalic.  Atraumatic. Eyes: EOMI. No discharge. Cardiovascular: No JVD. Respiratory: Normal effort.  No stridor. GI: Non-distended. Skin: Warm and dry.  Intact. Psych: Agitated. Musc: + TTP left greater trochanteric area Neuro: Alert Motor: Previously (not willing to participate in MMT today): LUE: Grossly 4 -/5 proximal distal LLE: Grossly 4 -/5 proximal to distal, unchanged  Assessment/Plan: 1. 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  Physiatrist is providing close team supervision and 24 hour management of active medical problems listed below.  Physiatrist and rehab team continue to assess barriers to discharge/monitor patient progress toward functional and medical goals  Care Tool:  Bathing    Body parts bathed by patient: Right arm, Left  arm, Chest, Abdomen, Front perineal area, Buttocks, Right upper leg, Left upper leg, Face   Body parts bathed by helper: Right lower leg, Left lower leg     Bathing assist Assist Level: Minimal Assistance - Patient > 75%     Upper Body Dressing/Undressing Upper body dressing   What is the patient wearing?: Pull over shirt    Upper body assist Assist Level: Minimal Assistance - Patient > 75%(standing)    Lower Body Dressing/Undressing Lower body dressing      What is the patient wearing?: Pants     Lower body assist Assist for lower body dressing: Minimal Assistance - Patient > 75%     Toileting Toileting    Toileting assist Assist for toileting: Minimal Assistance - Patient > 75%     Transfers Chair/bed transfer  Transfers assist     Chair/bed transfer assist level: Supervision/Verbal cueing     Locomotion Ambulation   Ambulation assist      Assist level: Contact Guard/Touching assist(single episode of balance loss requiring min assist) Assistive device: No Device Max distance: 220   Walk 10 feet activity   Assist     Assist level: Supervision/Verbal cueing Assistive device: No Device   Walk 50 feet activity   Assist    Assist level: Supervision/Verbal cueing Assistive device: Walker-rolling    Walk 150 feet activity   Assist    Assist level: Contact Guard/Touching assist Assistive device: Walker-rolling    Walk 10 feet on uneven surface  activity   Assist     Assist level: Contact Guard/Touching assist Assistive device: Chemical engineer  Assist Will patient use wheelchair at discharge?: No             Wheelchair 50 feet with 2 turns activity    Assist            Wheelchair 150 feet activity     Assist          Blood pressure 96/79, pulse 74, temperature 98.4 F (36.9 C), temperature source Oral, resp. rate 16, weight 52.1 kg, SpO2 99 %.  Medical Problem List and Plan: 1.   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  2.  Antithrombotics: -DVT/anticoagulation: Lovenox             -antiplatelet therapy: Aspirin 81 mg daily and Plavix 75 mg daily x3 weeks then aspirin alone 3. Pain Management: Tylenol as needed  Gabapentin 100 3 times daily started on 12/14  Voltaren gel to left hip  Lidoderm patch ordered on 12/16  Will consider injection for left greater trochanteric bursitis if necessary 4. Mood: Provide emotional support             -antipsychotic agents: N/A 5. Neuropsych: This patient is capable of making decisions on his own behalf   Discussed with neuropsych-patient appears to be more mobile and cooperative. 6. Skin/Wound Care: Routine skin checks 7. Fluids/Electrolytes/Nutrition: Routine in and outs with follow-up chemistries 8.  Essential hypertension.  Monitor with increased mobility.  Patient on lisinopril 2.5 mg daily prior to admission.  Resume as needed  Relatively soft, but?  Improving on 12/16 9.  History of polysubstance abuse as well as tobacco abuse.  Urine drug screen positive cocaine.    Counsel 10.  Hyperlipidemia.  Lipitor 11. 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  Hyponatremia  Sodium 133 on 12/11, plan to order labs later this week  Continue to monitor 13.  Acute blood loss anemia  Hemoglobin 11.5 on 12/11  Continue to monitor  LOS: 6 days A FACE TO FACE EVALUATION WAS PERFORMED  Zophia Marrone Lorie Phenix 03/12/2019, 11:20 AM

## 2019-03-12 NOTE — Progress Notes (Signed)
Pt continues to c/o L hip pain. Pt requesting tylenol frequently. Tylenol and heat effective for pain management at this time. Pt resting in bed at this time.

## 2019-03-12 NOTE — Patient Care Conference (Signed)
Inpatient RehabilitationTeam Conference and Plan of Care Update Date: 03/12/2019   Time: 11:05 AM   Patient Name: [name]      Medical Record Number: [id number]  Date of Birth: 07/16/1955 Sex: Male         Room/Bed: 4W20C/4W20C-01 Payor Info: Payor: MEDICAID [place] / Plan: MEDICAID [place] ACCESS / Product Type: *No Product type* /    Admit Date/Time:  03/06/2019  2:43 PM  Primary Diagnosis:  Right middle cerebral artery stroke [facility])  Patient Active Problem List   Diagnosis Date Noted  . Greater trochanteric bursitis of left hip   . Hyponatremia   . Acute blood loss anemia   . Chronic combined systolic (congestive) and diastolic (congestive) heart failure ([place])   . Essential hypertension   . Left leg pain   . Right middle cerebral artery stroke ([place]) 03/06/2019  . Left hemiparesis ([place]) 03/06/2019  . Acute cerebrovascular accident (CVA) ([place]) 03/04/2019  . Chest pain 10/16/2017  . HYPERLIPIDEMIA-MIXED 06/16/2008  . TOBACCO ABUSE 06/16/2008  . MARIJUANA ABUSE 06/16/2008  . Cocaine abuse ([place]) 06/16/2008  . CAD, ARTERY BYPASS GRAFT 04/30/2008    Expected Discharge Date: Expected Discharge Date: 03/15/19  Team Members Present: Physician leading conference: Dr. Delice Lesch Social Worker Present: Ovidio Kin, LCSW Nurse Present: Ellison Carwin, LPN Case Manager:Genie Lafreda Casebeer, RN  PT Present: Donnamae Jude, PT OT Present: Meriel Pica, OT SLP Present: Charolett Bumpers, SLP PPS Coordinator present : Gunnar Fusi, SLP     Current Status/Progress Goal Weekly Team Focus  Bowel/Bladder   pt is continent of b/b, lbm 03/10/19  pt will remain continent of b/b  Q2h toileting/ PRN   Swallow/Nutrition/ Hydration             ADL's   min A  Supervision  balance, LUE coordination and strength, ADL training, pt education   Mobility   on occasion he is supervision for household ambulation/in room but again, this varies.  supervision  balance, gait, RU,   Communication        Safety/Cognition/ Behavioral Observations            Pain   pt c/o 7/10 L hip pain. Pt directs pain toward the hip bone location. tylenol and k pad used to help decrease pain.  pt pain will be less than3  Assess pain qshift/prn   Skin   pt has no obvious signs of skin breakdown or infection  pt will remeain infection and breakdown free  Assess skin qshift/prn      *See Care Plan and progress notes for long and short-term goals.     Barriers to Discharge  Current Status/Progress Possible Resolutions Date Resolved   Nursing                  PT                    OT                  SLP                SW                Discharge Planning/Teaching Needs:  Lives in boarding house-working on if freinds will assist or possibly going to cousin's home. Will most likely not have 24 hr supervision at [place]      Team Discussion: Agitated, engages very little, monitoring labs, L hip pain, ordered lidoderm patch, daily wts ordered.  RN a+O, L hip pain, Kpad, sat in floor Monday pm.  OT CGA ADLs, S goals, cousin may stay with him at DC.  PT CGA without walker, S goals, stairs CGA, falls X 3 at home per patient.   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 use on 03/04/2019 Weekly Focus/Goal: Improve mobility, left hip pain, hyponatremia, CHF  Barriers to Discharge: Medical stability;Medication compliance   Possible Resolutions to Barriers: Therapies, interventions for left hip, follow labs, daily weights   Continued Need for Acute Rehabilitation Level of Care: The patient requires daily medical management by a physician with specialized training in physical medicine and rehabilitation for the following reasons: Direction of a multidisciplinary physical rehabilitation program to maximize functional independence : Yes Medical management of patient stability for increased activity  during participation in an intensive rehabilitation regime.: Yes Analysis of laboratory values and/or radiology reports with any subsequent need for medication adjustment and/or medical intervention. : Yes   I attest that I was present, lead the team conference, and concur with the assessment and plan of the team.   Retta Diones 03/12/2019, 3:57 PM  Team conference was held via web/ teleconference due to [place] - 19

## 2019-03-12 NOTE — Plan of Care (Signed)
  Problem: Consults Goal: RH STROKE PATIENT EDUCATION Description: See Patient Education module for education specifics  Outcome: Progressing   Problem: RH SKIN INTEGRITY Goal: RH STG SKIN FREE OF INFECTION/BREAKDOWN Description: Free of breakdown with min assist Outcome: Progressing   Problem: RH SAFETY Goal: RH STG ADHERE TO SAFETY PRECAUTIONS W/ASSISTANCE/DEVICE Description: STG Adhere to Safety Precautions With Assistance/Device. Outcome: Progressing   Problem: RH KNOWLEDGE DEFICIT Goal: RH STG INCREASE KNOWLEDGE OF HYPERTENSION Description: Patient able to describe medications to manage blood pressure with cues/handouts Outcome: Progressing Goal: RH STG INCREASE KNOWLEDGE OF STROKE PROPHYLAXIS Description: Patient able to verbalize prevention of stroke including diet, exercise, lifestyle modifications, and medication management Outcome: Progressing

## 2019-03-12 NOTE — Consult Note (Signed)
Neuropsychological Consultation   Patient:   [name]   DOB:   11-Apr-1955  MR Number:  [id number]  Location:  [place] [place] [id number] [place] [address] Dept: [place]: [phone]           Date of Service:   03/12/2019  Start Time:   11 AM End Time:   12 PM  Provider/Observer:  Ilean Skill, Psy.D.       Clinical Neuropsychologist       Billing Code/Service: [id number]  Chief Complaint:    [name] is a 63 year old male with a history of polysubstance abuse including cocaine abuse, CAD with stenting as well as CABG 2010 maintained on aspirin, ischemic cardiomyopathy, tobacco abuse and hypertension.  The patient presented on 03/04/2019 with left-sided weakness over the prior 2 days and noted falls due to weakness.  MRI showed multifocal acute ischemia within the right MCA territory.  The patient continued with motor deficits and the patient was ultimately admitted to the comprehensive rehabilitation program.  Reason for Service:  Patient was referred for neuropsychological consultation due to coping and adjustment issues.  Below is the HPI for the current admission.  HPI: [name] is a 63 year old right-handed male with history of polysubstance abuse including cocaine, CAD with stenting as well as CABG 2010 maintained on aspirin, ischemic cardiomyopathy ejection fraction of 35%, tobacco abuse and hypertension.  Per chart review he lives alone 1 level apartment.  Independent prior to admission.  He does have local family but no true contact with them.  Presented 03/04/2019 with left-sided weakness x2 days with noted falls due to weakness.  MRI showed multifocal acute ischemia within the right MCA territory.  No hemorrhage or mass-effect.  Admission chemistries unremarkable, hemoglobin 11.8, alcohol negative, SARS coronavirus negative, urinalysis negative nitrite, urine  drug screen positive cocaine.  CT angiogram of head and neck occlusion of mid M1 segment of the right middle cerebral artery.  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  Echocardiogram with ejection fraction of 45% and global hypokinesis.  Neurology consulted maintain on aspirin and Plavix x3 weeks and aspirin alone.  Subcutaneous Lovenox for DVT prophylaxis.  Tolerating a regular diet.  Therapy evaluations completed and patient was admitted for a comprehensive rehab program.  Current Status:  Today, the patient was actually quite receptive to talking and we spent the full session and reasonable discussions about current situation and his coping strategies.  The patient has been very resistant towards therapeutic efforts often reporting that he does not "feel well" and resistant to having frank discussions with his treatment team.  The patient acknowledged this during our sessions today and we discussed the need for his full participation not only for the remainder of his inpatient treatment efforts but also going forward as far as maximizing recovery from his recent stroke.  The patient is getting continue to need good effort on his part to make maximum recovery and further substance abuse or lack of ongoing physical efforts and rehab are likely to leave him with less than optimal recovery.    Behavioral Observation: [name]  presents as a 63 y.o.-year-old Right African American Male who appeared his stated age. his dress was Appropriate and he was Well Groomed and his manners were Appropriate to the situation.  his participation was indicative of Appropriate and Redirectable behaviors.  There were  any physical disabilities noted.  he displayed an appropriate level of cooperation and motivation.     Interactions:    Active Appropriate and Redirectable  Attention:   abnormal and attention span appeared shorter than expected for  age  Memory:   abnormal; remote memory intact, recent memory impaired  Visuo-spatial:  not examined  Speech (Volume):  low  Speech:   normal; normal  Thought Process:  Coherent and Relevant  Though Content:  WNL; not suicidal and not homicidal  Orientation:   person, place and time/date  Judgment:   Fair  Planning:   Poor  Affect:    Irritable  Mood:    Dysphoric  Insight:   Fair  Intelligence:   low  Medical History:   Past Medical History:  Diagnosis Date  . CAD (coronary artery disease)    a. 04/2008 Cath: LM nl, LAD 50p, 31m, LCX min irregs, RCA 50p/m, 40d, RPDA 60-70ost; b. 04/2008 CABG x 4: LIMA->LAD, VG->D2, VG->RPDA->RPL.  . Cocaine abuse ([place])    a. Quit early 2019.  . Ischemic cardiomyopathy    a. LV gram: EF 30-35%, apical/periapical AK.  . Tobacco abuse         Psychiatric History:  While there is no prior history of significant psychiatric disorder beyond his polysubstance abuse the patient does have a history of polysubstance abuse and significant cocaine use.  Family Med/Psych History: History reviewed. No pertinent family history.   Impression/DX:  [name] is a 63 year old male with a history of polysubstance abuse including cocaine abuse, CAD with stenting as well as CABG 2010 maintained on aspirin, ischemic cardiomyopathy, tobacco abuse and hypertension.  The patient presented on 03/04/2019 with left-sided weakness over the prior 2 days and noted falls due to weakness.  MRI showed multifocal acute ischemia within the right MCA territory.  The patient continued with motor deficits and the patient was ultimately admitted to the comprehensive rehabilitation program.  Today, the patient was actually quite receptive to talking and we spent the full session and reasonable discussions about current situation and his coping strategies.  The patient has been very resistant towards therapeutic efforts often reporting that he does not "feel well" and  resistant to having frank discussions with his treatment team.  The patient acknowledged this during our sessions today and we discussed the need for his full participation not only for the remainder of his inpatient treatment efforts but also going forward as far as maximizing recovery from his recent stroke.  The patient is getting continue to need good effort on his part to make maximum recovery and further substance abuse or lack of ongoing physical efforts and rehab are likely to leave him with less than optimal recovery.   Disposition/Plan:  The patient is likely going home very soon and is getting ready for discharge.  However, the patient realizes that he needs to utilize what ever therapeutic interventions he has left for discharge and admits that he has been less than fully motivated and is not always done or provided his greatest effort.  We talked about not letting this continue once he is discharged home and acknowledged the challenges he is going to face and what he will need to do to get maximal recovery.  Diagnosis:    Right middle cerebral artery stroke [facility]) - Plan: Ambulatory referral to Physical Medicine Rehab  Acute cerebrovascular accident (CVA) [facility] - Plan: Ambulatory referral to Neurology         Electronically Signed  _______________________ Ilean Skill, Psy.D.

## 2019-03-12 NOTE — Progress Notes (Signed)
Physical Therapy Session Note  Patient Details  Name: [name] MRN: [id number] Date of Birth: [birth year]  Today's Date: 03/12/2019 PT Individual Time: 8182-9937 and 1696 -7893 PT Individual Time Calculation (min): 9 min  And 55 min and  Today's Date: 03/12/2019 PT Missed Time: 51 Minutes Missed Time Reason: Pain  Short Term Goals: Week 1:  PT Short Term Goal 1 (Week 1): = to LTGs based on ELOS  Skilled Therapeutic Interventions/Progress Updates:    Session 1: Pt received L sidelying in bed and upon seeing therapist stating "I'm not doing no exercise today" reporting that he is having L hip pain due to "arthritis" and stating that doing the stairs yesterday made the pain worse. Pt is currently using the heat pad and MD in/out for morning assessment. Therapist discussed pain management strategies and educated pt on using the heat pad and performing gentle AROM within pain free range. Pt continues to request resting at this time due to pain and reporting overall not feeling well. Pt left sidelying in bed with needs in reach and bed alarm on. Missed 51 minutes of skilled physical therapy.   Session 2: Pt received L sidelying, asleep in bed and upon awakening stated "give me a minute" but agreeable to therapy session. Pt demonstrates significant fluctuations in his mood during the therapy session from using profane language and curse words to refuse an activity and becoming frustrated with the therapist to then being kind and stating offering to assist cleaning up after an activity. Supine>sit, HOB slightly elevated, with supervision. Performed sit<>stands with and without the RW with close supervision for safety throughout session. Gait ~180ft using RW to therapy gym with close supervision. Pt refuses hands-on assistance for steadying throughout session stating "don't put your hands on me" demonstrating increased frustration. Pt continues to report L hip pain during session stating he feels  that it is due to the "hard floors." Pt reports that he does not plan to use a RW at home even though he often chooses to use RW when walking to the gym stating he doesn't want to "walk that far without it" due to fear that his "leg will give out under him" and despite therapist educating him on importance of him having a RW and using it at home pt refuses it. Performed dynamic standing balance and L UE NMR task of cross body reaching to place clothespins from low surface of bench to high surface of basketball goal all with close supervision - pt became frustrated with this task due to having to reach across his body and demonstrates not being receptive to therapist's education on the goal of challenging his balance. Performed standing balance and L UE NMR dexterity task of standing on airex while completing small peg board pattern with L hand - close supervision for safety and despite attempts at encouraging pt to remove R UE support from table for increased balance challenge pt not agreeable. Performed seated B UE strengthening tasks of 2sets of 10 reps in circuit fashion of overhead presses with 3lb weighted dowel rod, scapular retractions and tricep extensions against level 3 green theraband resistance -  mod cuing for proper form/technique. Dynamic gait challenge of cone weaving with RW and close supervision progress to no AD and pt picking up cones from the floor without UE support all with close supervision for safety - no overt LOBs. Pt requesting to use bathroom - gait ~138ft using RW back to room with close supervision. Once in room pt  parked RW off to the side and reports he doesn't use it when walking in/out of the bathroom - ambulated in/out of bathroom, standing LB clothing management and continent of urine, and standing hand hygiene at sink all with close supervision. Sit>supine with supervision. Pt left supine in bed with needs in reach and bed alarm on.  Therapy Documentation Precautions:   Precautions Precautions: Fall Precaution Comments: L weakness and L inattention Restrictions Weight Bearing Restrictions: No  Pain:   Session 1: Reports significant L hip pain - unrated - MD present and aware - pt reports receiving medication and deferring further intervention at this time.  Session 2: Continues to report L hip pain and demonstrates antalgic gait pattern when walking without AD - therapist's modified treatment interventions for pain management.    Therapy/Group: Individual Therapy  Tawana Scale, PT, DPT 03/12/2019, 7:47 AM

## 2019-03-12 NOTE — Progress Notes (Signed)
Initial Nutrition Assessment  RD working remotely.  DOCUMENTATION CODES:   Underweight, suspect some degree of malnutrition but unable to confirm at this time  INTERVENTION:   - Ensure Enlive po BID, each supplement provides 350 kcal and 20 grams of protein  - MVI with minerals daily  - Encourage continued adequate PO intake  NUTRITION DIAGNOSIS:   Increased nutrient needs related to other (therapies) as evidenced by estimated needs.  GOAL:   Patient will meet greater than or equal to 90% of their needs  MONITOR:   PO intake, Supplement acceptance, Weight trends, Labs  REASON FOR ASSESSMENT:   Other (low BMI)    ASSESSMENT:   63 year old male with history of polysubstance abuse including cocaine, CAD s/p stenting as well as CABG 2010, ischemic cardiomyopathy, tobacco abuse and HTN. Presented 03/04/19 with left-sided weakness with noted falls due to weakness. MRI showed multifocal acute ischemia within the right MCA territory. CT angiogram of head and neck revealed occlusion of mid M1 segment of the right middle cerebral artery. 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 Pt admitted to CIR on 12/10.   Unable to reach pt via phone call to room.  PO intake was 100% for 7 out of the last 8 recorded meals.  Reviewed weight history in chart. Pt with a weight loss of 6.9 kg since 10/05/18. This is an 11.7% weight loss in 5 months which is significant for timeframe. Suspect some degree of malnutrition is present but RD unable to confirm without NFPE.  Given recent weight loss, RD will order oral nutrition supplements to aid pt in meeting kcal and protein needs during admission. Will also order daily MVI.  Meal Completion: 75-100% x last 8 meals  Medications reviewed.  Labs reviewed: sodium 133  NUTRITION - FOCUSED PHYSICAL EXAM:  Unable to complete at this time. RD working remotely.  Diet Order:   Diet  Order            Diet Heart Room service appropriate? Yes; Fluid consistency: Thin  Diet effective now              EDUCATION NEEDS:   No education needs have been identified at this time  Skin:  Skin Assessment: Reviewed RN Assessment  Last BM:  03/10/19 large type 6  Height:   Ht Readings from Last 1 Encounters:  03/04/19 5\' 7"  (1.702 m)    Weight:   Wt Readings from Last 1 Encounters:  03/12/19 52.1 kg    Ideal Body Weight:  67.3 kg  BMI:  Body mass index is 17.99 kg/m.  Estimated Nutritional Needs:   Kcal:  1800-2000  Protein:  80-95 grams  Fluid:  >/= 1.7 L    Gaynell Face, MS, RD, LDN Inpatient Clinical Dietitian Pager: [phone] Weekend/After Hours: [phone]

## 2019-03-13 ENCOUNTER — Inpatient Hospital Stay (HOSPITAL_COMMUNITY): Payer: Medicaid Other

## 2019-03-13 ENCOUNTER — Inpatient Hospital Stay (HOSPITAL_COMMUNITY): Payer: Medicaid Other | Admitting: Physical Therapy

## 2019-03-13 ENCOUNTER — Inpatient Hospital Stay (HOSPITAL_COMMUNITY): Payer: Medicaid Other | Admitting: Occupational Therapy

## 2019-03-13 DIAGNOSIS — R1909 Other intra-abdominal and pelvic swelling, mass and lump: Secondary | ICD-10-CM

## 2019-03-13 LAB — CREATININE, SERUM
Creatinine, Ser: 0.84 mg/dL (ref 0.61–1.24)
GFR calc Af Amer: >60 mL/min (ref >60)
GFR calc non Af Amer: >60 mL/min (ref >60)

## 2019-03-13 MED ORDER — ATORVASTATIN CALCIUM 40 MG PO TABS: 40.0000 mg | ORAL_TABLET | Freq: Every day | ORAL | 11 refills | Status: DC

## 2019-03-13 MED ORDER — LIDOCAINE 5 % EX PTCH: 1.0000 | MEDICATED_PATCH | CUTANEOUS | 0 refills | Status: DC

## 2019-03-13 MED ORDER — ADULT MULTIVITAMIN W/MINERALS CH: 1.0000 | ORAL_TABLET | Freq: Every day | ORAL | Status: DC

## 2019-03-13 MED ORDER — CLOPIDOGREL BISULFATE 75 MG PO TABS: 75.0000 mg | ORAL_TABLET | Freq: Every day | ORAL | 0 refills | Status: DC

## 2019-03-13 MED ORDER — DICLOFENAC SODIUM 1 % EX GEL: 2.0000 g | Freq: Four times a day (QID) | CUTANEOUS | 1 refills | Status: DC

## 2019-03-13 MED ORDER — GABAPENTIN 100 MG PO CAPS: 100.0000 mg | ORAL_CAPSULE | Freq: Three times a day (TID) | ORAL | 1 refills | Status: DC

## 2019-03-13 MED ORDER — METHOCARBAMOL 500 MG PO TABS: 500.0000 mg | ORAL_TABLET | Freq: Four times a day (QID) | ORAL | 0 refills | Status: DC | PRN

## 2019-03-13 MED ORDER — NICOTINE 21 MG/24HR TD PT24: MEDICATED_PATCH | TRANSDERMAL | 0 refills | Status: DC

## 2019-03-13 NOTE — Discharge Instructions (Signed)
Inpatient Rehab Discharge Instructions  [name] Discharge date and time: No discharge date for patient encounter.   Activities/Precautions/ Functional Status: Activity: activity as tolerated Diet: regular diet Wound Care: none needed Functional status:  ___ No restrictions     ___ Walk up steps independently ___ 24/7 supervision/assistance   ___ Walk up steps with assistance ___ Intermittent supervision/assistance  ___ Bathe/dress independently ___ Walk with walker     _x__ Bathe/dress with assistance ___ Walk Independently    ___ Shower independently ___ Walk with assistance    ___ Shower with assistance ___ No alcohol     ___ Return to work/school ________  Special Instructions: No driving smoking or alcohol or illicit drug use    COMMUNITY REFERRALS UPON DISCHARGE:   None:  PATIENT FEELS HAS NO NEEDS AND DOES NOT FEEL FOLLOW UP IS NEEDED   STROKE/TIA DISCHARGE INSTRUCTIONS SMOKING Cigarette smoking nearly doubles your risk of having a stroke & is the single most alterable risk factor  If you smoke or have smoked in the last 12 months, you are advised to quit smoking for your health.  Most of the excess cardiovascular risk related to smoking disappears within a year of stopping.  Ask you doctor about anti-smoking medications  [place] Quit Line: 1-800-QUIT NOW  Free Smoking Cessation Classes [phone]  CHOLESTEROL Know your levels; limit fat & cholesterol in your diet  Lipid Panel     Component Value Date/Time   CHOL 184 03/05/2019 0443   TRIG 72 03/05/2019 0443   HDL 34 (L) 03/05/2019 0443   CHOLHDL 5.4 03/05/2019 0443   VLDL 14 03/05/2019 0443   LDLCALC 136 (H) 03/05/2019 0443      Many patients benefit from treatment even if their cholesterol is at goal.  Goal: Total Cholesterol (CHOL) less than 160  Goal:  Triglycerides (TRIG) less than 150  Goal:  HDL greater than 40  Goal:  LDL (LDLCALC) less than 100   BLOOD PRESSURE American Stroke  Association blood pressure target is less that 120/80 mm/Hg  Your discharge blood pressure is:     Monitor your blood pressure  Limit your salt and alcohol intake  Many individuals will require more than one medication for high blood pressure  DIABETES (A1c is a blood sugar average for last 3 months) Goal HGBA1c is under 7% (HBGA1c is blood sugar average for last 3 months)  Diabetes: No known diagnosis of diabetes    Lab Results  Component Value Date   HGBA1C 6.3 (H) 03/05/2019     Your HGBA1c can be lowered with medications, healthy diet, and exercise.  Check your blood sugar as directed by your physician  Call your physician if you experience unexplained or low blood sugars.  PHYSICAL ACTIVITY/REHABILITATION Goal is 30 minutes at least 4 days per week  Activity: Increase activity slowly, Therapies: Physical Therapy: Home Health Return to work:   Activity decreases your risk of heart attack and stroke and makes your heart stronger.  It helps control your weight and blood pressure; helps you relax and can improve your mood.  Participate in a regular exercise program.  Talk with your doctor about the best form of exercise for you (dancing, walking, swimming, cycling).  DIET/WEIGHT Goal is to maintain a healthy weight  Your discharge diet is:  Diet Order    None      liquids Your height is:    Your current weight is:   Your Body Mass Index (BMI) is:  Following the type of diet specifically designed for you will help prevent another stroke.  Your goal weight range is:    Your goal Body Mass Index (BMI) is 19-24.  Healthy food habits can help reduce 3 risk factors for stroke:  High cholesterol, hypertension, and excess weight.  RESOURCES Stroke/Support Group:  Call [phone]   STROKE EDUCATION PROVIDED/REVIEWED AND GIVEN TO PATIENT Stroke warning signs and symptoms How to activate emergency medical system (call 911). Medications prescribed at discharge. Need for  follow-up after discharge. Personal risk factors for stroke. Pneumonia vaccine given:  Flu vaccine given:  My questions have been answered, the writing is legible, and I understand these instructions.  I will adhere to these goals & educational materials that have been provided to me after my discharge from the hospital.     Aspirin and plavix for three weeks then aspirin alone   My questions have been answered and I understand these instructions. I will adhere to these goals and the provided educational materials after my discharge from the hospital.  Patient/Caregiver Signature _______________________________ Date __________  Clinician Signature _______________________________________ Date __________  Please bring this form and your medication list with you to all your follow-up doctor's appointments.

## 2019-03-13 NOTE — Progress Notes (Signed)
Physical Therapy Session Note  Patient Details  Name: [name] MRN: [id number] Date of Birth: [birth year]  Today's Date: 03/13/2019 PT Individual Time: 0930-1030 PT Individual Time Calculation (min): 60 min   Short Term Goals: Week 1:  PT Short Term Goal 1 (Week 1): = to LTGs based on ELOS  Skilled Therapeutic Interventions/Progress Updates:    Functional bed mobility without assistance. Pt able to gait to and from bathroom in room with close supervision without AD and perform toileting in standing with supervision (+ urine void continently). Declines using AD to leave room today. Gait on unit without AD at close supervision level > 150' with antalgic gait due to hip pain and slow cadence. Pt occasionally requires cues for safety with obstacles. Performed simualted car transfer with supervision overall with step in technique self selected. Pt talking about wanting a cup of coffee so engaged in functional bimanual task to prepare coffee at [employer] using LUE to manipulate coffee creamer and sugar and stabilize cup (PT provided extra assist just in case due to hot nature of beverage but it wasn't needed). Pt then able to carry cup to dayroom with supervision while walking with cues to attend to position of the cup, as he kept supinating hand and it would tilt. Pt engaged in [place] for balance and and functional use of LUE during giant connect 4 game and pitching of horsehoes as well as cleaning them at the end of the session (including donning a glove) with pt using LUE for all tasks and including picking up items off of floor for each activity with overall close supervision during activity. Returned to recliner at end of session with supervision and all needs in reach.   Therapy Documentation Precautions:  Precautions Precautions: Fall Precaution Comments: L weakness and L inattention Restrictions Weight Bearing Restrictions: No  Pain:  Reports pain in hip is somewhat better today.  Using heating pad.    Therapy/Group: Individual Therapy  Canary [place] Ivory Broad, PT, DPT, CBIS  03/13/2019, 12:16 PM

## 2019-03-13 NOTE — Progress Notes (Signed)
Occupational Therapy Session Note  Patient Details  Name: [name] MRN: [id number] Date of Birth: 1955-09-23  Today's Date: 03/13/2019 OT Individual Time: 1100-1200 OT Individual Time Calculation (min): 60 min    Short Term Goals: Week 1:  OT Short Term Goal 1 (Week 1): STG=LTG d/t ELOS  Skilled Therapeutic Interventions/Progress Updates:    Pt received in recliner and agreeable to therapy including a shower.  Pt cooperative and motivated this session.  With Supervision, pt ambulated in room without AD.  He was able to complete shower and dressing with set up only.   Discussed his home set up.  He likes to get into the tub for a tub bath 2x a week but usually stands to shower.  He has grab bars on walls in tub.   Pt ambulated to tub room on unit to practice tub transfers.  Therapist demonstrated a safe transfer of how to sit all the way down in tub and how to get out using wall of tub and bars. Pt repeat demonstrated safely with no cues need.  He practiced stepping into tub as if he was to shower. Pt able to do both transfers with bars with S and no cues.   He then walked back to his room. Pt resting in recliner with seat alarm. Call light in reach. Coffee provided to pt.    Therapy Documentation Precautions:  Precautions Precautions: Fall Precaution Comments: L weakness and L inattention Restrictions Weight Bearing Restrictions: No       Pain: 5/10 L hip - using kpad for relief.  Pt stated hot shower was helpful   ADL: ADL Grooming: Supervision/safety Where Assessed-Grooming: Standing at sink Upper Body Bathing: Setup Where Assessed-Upper Body Bathing: Shower Lower Body Bathing: Setup Where Assessed-Lower Body Bathing: Shower Upper Body Dressing: Setup Where Assessed-Upper Body Dressing: Chair Lower Body Dressing: Setup Where Assessed-Lower Body Dressing: Chair Toileting: Supervision/safety Where Assessed-Toileting: Glass blower/designer: Close  supervision Toilet Transfer Method: Theatre manager Transfer: Close supervison Clinical cytogeneticist Method: Optometrist: Energy manager: Close supervision Social research officer, government Method: Heritage manager: Radio broadcast assistant, Grab bars   Therapy/Group: Individual Therapy  [place] 03/13/2019, 12:35 PM

## 2019-03-13 NOTE — Progress Notes (Signed)
Physical Therapy Session Note  Patient Details  Name: [name] MRN: [id number] Date of Birth: [birth year]  Today's Date: 03/13/2019 PT Individual Time: 7737-3668 PT Individual Time Calculation (min): 41 min   and  Today's Date: 03/13/2019 PT Missed Time: 19 Minutes Missed Time Reason: Pain  Short Term Goals: Week 1:  PT Short Term Goal 1 (Week 1): = to LTGs based on ELOS  Skilled Therapeutic Interventions/Progress Updates:    Pt received L sidelying in bed and initially stating "I'm not doing no exercise right now" reporting it was too late in the day and he didn't want to move around because it would make him need to use the bathroom. Therapist provided encouragement and pt agreeable to limited session. Supine>sit independently. Sit<>stands, no AD, with supervision for safety throughout session. Gait in/out of bathroom, no AD, with distant supervision - pt continent of urine. Ambulated ~183ft, no AD, with close supervision for safety to day room - pt continues to demonstrate antalgic gait pattern with decreased gait speed. Engaged patient in Wii baseball game initially in standing then progressed to sitting due to increased L hip/side pain focusing on L UE NMR, dynamic standing balance, and activity tolerance with close supervision for safety. Pt required mod cuing to understand how to play game and for increased speed of movement in L UE to throw/hit the ball. Pt requesting to return to room due to increased L side pain. Ambulated ~138ft, no AD, back to room with close supervision. Sit>supine independently. Pt left supine in bed with needs in reach and bed alarm on.  Therapy Documentation Precautions:  Precautions Precautions: Fall Precaution Comments: L weakness and L inattention Restrictions Weight Bearing Restrictions: No  Pain:   Reports L hip and side pain - modified therapy session and notified RN for medication administration.    Therapy/Group: Individual  Therapy  Tawana Scale, PT, DPT 03/13/2019, 5:53 PM

## 2019-03-13 NOTE — Progress Notes (Signed)
Social Work Patient ID: [name], male   DOB: Jun 15, 1955, 63 y.o.   MRN: [id number]  Met with pt who feels he ha no needs. He would like a heating pad, but insurance does not cover this. He does not want any equipment or any follow up therapies. He feels this has been too much for him and he can gage himself on what he can and can't do. Especially with his hip pain. He has found a ride for home on Sat.

## 2019-03-13 NOTE — Progress Notes (Signed)
[facility] PHYSICAL MEDICINE & REHABILITATION PROGRESS NOTE   Subjective/Complaints: Patient seen laying in bed this morning.  He states he slept well overnight.  He notes improvement in "hip pain".  He has questions regarding nodules in his groin  ROS: Denies CP, SOB, N/V/D  Objective:   No results found. No results for input(s): WBC, HGB, HCT, PLT in the last 72 hours. Recent Labs    03/13/19 0811  CREATININE 0.84    Intake/Output Summary (Last 24 hours) at 03/13/2019 1205 Last data filed at 03/13/2019 2229 Gross per 24 hour  Intake 600 ml  Output --  Net 600 ml     Physical Exam: Vital Signs Blood pressure 99/73, pulse 74, temperature 98.4 F (36.9 C), resp. rate 20, weight 52.1 kg, SpO2 98 %. Constitutional: No distress . Vital signs reviewed. HENT: Normocephalic.  Atraumatic. Eyes: EOMI. No discharge. Cardiovascular: No JVD. Respiratory: Normal effort.  No stridor. GI: Non-distended. Skin: Warm and dry.  Intact. Psych: Normal mood.  Normal behavior. Musc: 2 firm left inguinal masses Neuro: Alert Motor:  LUE: Grossly 4 -/5 proximal distal, improving LLE: Grossly 4 -/5 proximal to distal, improving  Assessment/Plan: 1. 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  Physiatrist is providing close team supervision and 24 hour management of active medical problems listed below.  Physiatrist and rehab team continue to assess barriers to discharge/monitor patient progress toward functional and medical goals  Care Tool:  Bathing    Body parts bathed by patient: Right arm, Left arm, Chest, Abdomen, Front perineal area, Buttocks, Right upper leg, Left upper leg, Face   Body parts bathed by helper: Right lower leg, Left lower leg     Bathing assist Assist Level: Minimal Assistance - Patient > 75%     Upper Body  Dressing/Undressing Upper body dressing   What is the patient wearing?: Pull over shirt    Upper body assist Assist Level: Minimal Assistance - Patient > 75%(standing)    Lower Body Dressing/Undressing Lower body dressing      What is the patient wearing?: Pants     Lower body assist Assist for lower body dressing: Minimal Assistance - Patient > 75%     Toileting Toileting    Toileting assist Assist for toileting: Minimal Assistance - Patient > 75%     Transfers Chair/bed transfer  Transfers assist     Chair/bed transfer assist level: Supervision/Verbal cueing     Locomotion Ambulation   Ambulation assist      Assist level: Supervision/Verbal cueing Assistive device: Walker-rolling Max distance: 151ft   Walk 10 feet activity   Assist     Assist level: Supervision/Verbal cueing Assistive device: Walker-rolling   Walk 50 feet activity   Assist    Assist level: Supervision/Verbal cueing Assistive device: Walker-rolling    Walk 150 feet activity   Assist    Assist level: Supervision/Verbal cueing Assistive device: Walker-rolling    Walk 10 feet on uneven surface  activity   Assist     Assist level: Contact Guard/Touching assist Assistive device: Aeronautical engineer Will patient use wheelchair at discharge?: No             Wheelchair 50 feet with 2 turns activity    Assist            Wheelchair 150 feet activity     Assist  Blood pressure 99/73, pulse 74, temperature 98.4 F (36.9 C), resp. rate 20, weight 52.1 kg, SpO2 98 %.  Medical Problem List and Plan: 1.  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  Antithrombotics: -DVT/anticoagulation: Lovenox  Creatinine within normal limits on 12/17             -antiplatelet therapy: Aspirin 81 mg daily and Plavix 75 mg daily x3 weeks then aspirin alone 3.  Pain Management: Tylenol as needed  Gabapentin 100 3 times daily started on 12/14  Voltaren gel to left hip  Lidoderm patch ordered on 12/16  Will consider injection for left greater trochanteric bursitis if necessary  Improving 4. Mood: Provide emotional support             -antipsychotic agents: N/A 5. Neuropsych: This patient is capable of making decisions on his own behalf   Discussed with neuropsych-patient appears to be more mobile and cooperative. 6. Skin/Wound Care: Routine skin checks 7. Fluids/Electrolytes/Nutrition: Routine in and outs with follow-up chemistries 8.  Essential hypertension.  Monitor with increased mobility.  Patient on lisinopril 2.5 mg daily prior to admission.  Resume as needed  Relatively soft, but?  Improving on 12/17 9.  History of polysubstance abuse as well as tobacco abuse.  Urine drug screen positive cocaine.    Counsel 10.  Hyperlipidemia.  Lipitor 11. 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  Hyponatremia  Sodium 133 on 12/11, labs ordered for tomorrow  Continue to monitor 13.  Acute blood loss anemia  Hemoglobin 11.5 on 12/11  Continue to monitor 14.  Inguinal masses  Ultrasound ordered   LOS: 7 days A FACE TO FACE EVALUATION WAS PERFORMED  Bryn Perkin Lorie Phenix 03/13/2019, 12:05 PM

## 2019-03-13 NOTE — Discharge Summary (Addendum)
Physician Discharge Summary  Patient ID: [name] MRN: [id number] DOB/AGE: 06/25/1955 63 y.o.  Admit date: 03/06/2019 Discharge date: 03/15/2019  Discharge Diagnoses:  Principal Problem:   Right middle cerebral artery stroke [facility]) Active Problems:   Cocaine abuse (HCC)   CAD, ARTERY BYPASS GRAFT   Left hemiparesis (HCC)   Hyponatremia   Acute blood loss anemia   Chronic combined systolic (congestive) and diastolic (congestive) heart failure (HCC)   Essential hypertension   Left leg pain   Greater trochanteric bursitis of left hip   Mass of left inguinal region Constipation  Discharged Condition: Stable  Significant Diagnostic Studies: CT ANGIO HEAD W OR WO CONTRAST  Result Date: 03/05/2019 CLINICAL DATA:  Stroke follow-up EXAM: CT ANGIOGRAPHY HEAD AND NECK TECHNIQUE: Multidetector CT imaging of the head and neck was performed using the standard protocol during bolus administration of intravenous contrast. Multiplanar CT image reconstructions and MIPs were obtained to evaluate the vascular anatomy. Carotid stenosis measurements (when applicable) are obtained utilizing NASCET criteria, using the distal internal carotid diameter as the denominator. CONTRAST:  54mL OMNIPAQUE IOHEXOL 350 MG/ML SOLN COMPARISON:  None. FINDINGS: CTA NECK FINDINGS SKELETON: There is no bony spinal canal stenosis. No lytic or blastic lesion. OTHER NECK: Normal pharynx, larynx and major salivary glands. No cervical lymphadenopathy. Unremarkable thyroid gland. UPPER CHEST: No pneumothorax or pleural effusion. No nodules or masses. AORTIC ARCH: There is no calcific atherosclerosis of the aortic arch. There is no aneurysm, dissection or hemodynamically significant stenosis of the visualized portion of the aorta. Conventional 3 vessel aortic branching pattern. The visualized proximal subclavian arteries are widely patent. RIGHT CAROTID SYSTEM: No dissection, occlusion or aneurysm. Mild atherosclerotic  calcification at the carotid bifurcation without hemodynamically significant stenosis. LEFT CAROTID SYSTEM: Normal without aneurysm, dissection or stenosis. VERTEBRAL ARTERIES: Left dominant configuration. Both origins are clearly patent. There is no dissection, occlusion or flow-limiting stenosis to the skull base (V1-V3 segments). CTA HEAD FINDINGS POSTERIOR CIRCULATION: --Vertebral arteries: Normal V4 segments. --Posterior inferior cerebellar arteries (PICA): Patent origins from the vertebral arteries. --Anterior inferior cerebellar arteries (AICA): Patent origins from the basilar artery. --Basilar artery: Normal. --Superior cerebellar arteries: Normal. --Posterior cerebral arteries: Normal. Both originate from the basilar artery. Posterior communicating arteries (p-comm) are diminutive or absent. ANTERIOR CIRCULATION: --Intracranial internal carotid arteries: There are severe stenoses of the cavernous segments of both internal carotid arteries due to calcific atherosclerosis. --Anterior cerebral arteries (ACA): Normal. Both A1 segments are present. Patent anterior communicating artery (a-comm). --Middle cerebral arteries (MCA): There is occlusion of the mid M1 segment of the right middle cerebral artery. The M2 branches are patent. VENOUS SINUSES: As permitted by contrast timing, patent. ANATOMIC VARIANTS: None Review of the MIP images confirms the above findings. IMPRESSION: 1. 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 2. 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 These results were communicated to Dr. Roland Rack at 12:31 am on 03/05/2019 by text page via the [facility] messaging system. Electronically Signed   By: Ulyses Jarred M.D.   On: 03/05/2019 00:33   CT ANGIO NECK W OR WO CONTRAST  Result Date: 03/05/2019 CLINICAL DATA:  Stroke follow-up EXAM: CT ANGIOGRAPHY HEAD AND NECK TECHNIQUE: Multidetector CT  imaging of the head and neck was performed using the standard protocol during bolus administration of intravenous contrast. Multiplanar CT image reconstructions and MIPs were obtained to evaluate the vascular anatomy. Carotid stenosis measurements (when applicable) are obtained  utilizing NASCET criteria, using the distal internal carotid diameter as the denominator. CONTRAST:  45mL OMNIPAQUE IOHEXOL 350 MG/ML SOLN COMPARISON:  None. FINDINGS: CTA NECK FINDINGS SKELETON: There is no bony spinal canal stenosis. No lytic or blastic lesion. OTHER NECK: Normal pharynx, larynx and major salivary glands. No cervical lymphadenopathy. Unremarkable thyroid gland. UPPER CHEST: No pneumothorax or pleural effusion. No nodules or masses. AORTIC ARCH: There is no calcific atherosclerosis of the aortic arch. There is no aneurysm, dissection or hemodynamically significant stenosis of the visualized portion of the aorta. Conventional 3 vessel aortic branching pattern. The visualized proximal subclavian arteries are widely patent. RIGHT CAROTID SYSTEM: No dissection, occlusion or aneurysm. Mild atherosclerotic calcification at the carotid bifurcation without hemodynamically significant stenosis. LEFT CAROTID SYSTEM: Normal without aneurysm, dissection or stenosis. VERTEBRAL ARTERIES: Left dominant configuration. Both origins are clearly patent. There is no dissection, occlusion or flow-limiting stenosis to the skull base (V1-V3 segments). CTA HEAD FINDINGS POSTERIOR CIRCULATION: --Vertebral arteries: Normal V4 segments. --Posterior inferior cerebellar arteries (PICA): Patent origins from the vertebral arteries. --Anterior inferior cerebellar arteries (AICA): Patent origins from the basilar artery. --Basilar artery: Normal. --Superior cerebellar arteries: Normal. --Posterior cerebral arteries: Normal. Both originate from the basilar artery. Posterior communicating arteries (p-comm) are diminutive or absent. ANTERIOR CIRCULATION:  --Intracranial internal carotid arteries: There are severe stenoses of the cavernous segments of both internal carotid arteries due to calcific atherosclerosis. --Anterior cerebral arteries (ACA): Normal. Both A1 segments are present. Patent anterior communicating artery (a-comm). --Middle cerebral arteries (MCA): There is occlusion of the mid M1 segment of the right middle cerebral artery. The M2 branches are patent. VENOUS SINUSES: As permitted by contrast timing, patent. ANATOMIC VARIANTS: None Review of the MIP images confirms the above findings. IMPRESSION: 1. 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 2. 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 These results were communicated to Dr. Roland Rack at 12:31 am on 03/05/2019 by text page via the [facility] messaging system. Electronically Signed   By: Ulyses Jarred M.D.   On: 03/05/2019 00:33   MR Brain Wo Contrast (neuro protocol)  Result Date: 03/04/2019 CLINICAL DATA:  Ataxia EXAM: MRI HEAD WITHOUT CONTRAST TECHNIQUE: Multiplanar, multiecho pulse sequences of the brain and surrounding structures were obtained without intravenous contrast. COMPARISON:  None. FINDINGS: Brain: There is multifocal acute ischemia within the right hemisphere, scattered within the right MCA territory with the largest lesions in the right frontal lobe and anterior right temporal lobe. No acute hemorrhage. No midline shift or other mass effect. Brain volume is normal. No hydrocephalus. There is mild cytotoxic edema associated with the ischemic sites but the brain parenchyma is otherwise normal. No chronic microhemorrhage. The midline structures are normal. Vascular: Flow voids are preserved. Skull and upper cervical spine: Normal bone marrow signal. Visualized extracranial soft tissues are normal. Sinuses/Orbits: Orbits are normal. No mastoid or middle ear effusion. Paranasal sinuses  are clear. Other: None IMPRESSION: Multifocal acute ischemia within the right MCA territory. No hemorrhage or mass effect. Electronically Signed   By: Ulyses Jarred M.D.   On: 03/04/2019 19:11   CT CEREBRAL PERFUSION W CONTRAST  Result Date: 03/05/2019 CLINICAL DATA:  Stroke follow-up. History of cocaine abuse. EXAM: CT PERFUSION BRAIN TECHNIQUE: Multiphase CT imaging of the brain was performed following IV bolus contrast injection. Subsequent parametric perfusion maps were calculated using RAPID software. CONTRAST:  75mL OMNIPAQUE IOHEXOL 350 MG/ML SOLN COMPARISON:  None. FINDINGS: CT  Brain Perfusion Findings: CBF (<30%) Volume: 59mL Perfusion (Tmax>6.0s) volume: 71mL. T-max is elevated throughout much of the right MCA territory. Using a Tmax threshold of 4 seconds instead of 6 seconds demonstrates a larger region of ischemia measuring 100 mL. Mismatch Volume: 100mL Infarct Core: 0 mL Infarction Location: CT perfusion parameters do not demonstrated core infarct. However, earlier brain MRI shows abnormal diffusion restriction at multiple locations in the right MCA territory. No recent non-contrast head CT is available for determining ASPECTS. IMPRESSION: 1. No core infarct identified by CT perfusion parameters. However, earlier brain MRI shows abnormal diffusion restriction at multiple locations in the right MCA territory. 2. 7 mL region within the right frontal operculum meeting parameters for ischemic penumbra. Using a Tmax threshold of 4 seconds instead of 6 seconds demonstrates a larger region of ischemia measuring 100 mL. Electronically Signed   By: Ulyses Jarred M.D.   On: 03/05/2019 01:47   DG Chest Port 1 View  Result Date: 03/04/2019 CLINICAL DATA:  Weakness, fall. EXAM: PORTABLE CHEST 1 VIEW COMPARISON:  April 18, 2017. FINDINGS: The heart size and mediastinal contours are within normal limits. Status post coronary bypass graft. No pneumothorax or pleural effusion is noted. Left lung is clear. Large  rounded density is noted in right lower lobe concerning for possible neoplasm. The visualized skeletal structures are unremarkable. IMPRESSION: Large rounded density seen in right lower lobe concerning for possible neoplasm. CT scan of the chest is recommended for further evaluation. Electronically Signed   By: Marijo Conception M.D.   On: 03/04/2019 15:49   ECHOCARDIOGRAM COMPLETE  Result Date: 03/05/2019   ECHOCARDIOGRAM REPORT   Patient Name:   [name] Date of Exam: 03/05/2019 Medical Rec #:  [id number]       Height:       67.0 in Accession #:    [id number]      Weight:       119.1 lb Date of Birth:  1955-08-28       BSA:          1.62 m Patient Age:    70 years        BP:           106/72 mmHg Patient Gender: M               HR:           75 bpm. Exam Location:  Inpatient Procedure: 2D Echo Indications:    Stroke 434.91 / I163.9  History:        Patient has prior history of Echocardiogram examinations, most                 recent 10/17/2017. CAD; Risk Factors:Current Smoker and                 Dyslipidemia. Polysubstance Abuse.  Sonographer:    Leavy Cella Referring Phys: [place] MOHAMMAD L GARBA IMPRESSIONS  1. EF 40-45% with global hypokinesis. There is akinesis of the apical septum, apical inferior, and apical segments. There is no obvious thrombus present in the LV, but would recommend a repeat contrast study to exclude apical thrombus in the patient with concerns for stroke.  2. Left ventricular ejection fraction, by visual estimation, is 40 to 45%. The left ventricle has mildly decreased function. There is no left ventricular hypertrophy.  3. Apical septal segment, apical inferior segment, and apex are abnormal.  4. The left ventricle demonstrates regional wall motion abnormalities.  5. Global right  ventricle has normal systolic function.The right ventricular size is normal. No increase in right ventricular wall thickness.  6. Left atrial size was normal.  7. Right atrial size was normal.  8. The  mitral valve is myxomatous. Trivial mitral valve regurgitation.  9. The tricuspid valve is grossly normal. Tricuspid valve regurgitation is trivial. 10. The aortic valve is tricuspid. Aortic valve regurgitation is not visualized. No evidence of aortic valve sclerosis or stenosis. 11. The pulmonic valve was grossly normal. Pulmonic valve regurgitation is not visualized. 12. TR signal is inadequate for assessing pulmonary artery systolic pressure. 13. The inferior vena cava is normal in size with greater than 50% respiratory variability, suggesting right atrial pressure of 3 mmHg. 14. A prior study was performed on 10/17/2017. 15. Changes from prior study are noted. 16. EF slightly improved ~45% compared with prior. WMA remain unchanged. FINDINGS  Left Ventricle: Left ventricular ejection fraction, by visual estimation, is 40 to 45%. The left ventricle has mildly decreased function. The left ventricle demonstrates regional wall motion abnormalities. The left ventricular internal cavity size was the left ventricle is normal in size. There is no left ventricular hypertrophy. EF 40-45% with global hypokinesis. There is akinesis of the apical septum, apical inferior, and apical segments. There is no obvious thrombus present in the LV, but would recommend a repeat contrast study to exclude apical thrombus in the patient with concerns for stroke.  LV Wall Scoring: The apical septal segment, apical inferior segment, and apex are akinetic. Right Ventricle: The right ventricular size is normal. No increase in right ventricular wall thickness. Global RV systolic function is has normal systolic function. Left Atrium: Left atrial size was normal in size. Right Atrium: Right atrial size was normal in size Pericardium: There is no evidence of pericardial effusion. 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 Tricuspid Valve: The tricuspid valve is grossly normal. Tricuspid valve regurgitation is  trivial. Aortic Valve: The aortic valve is tricuspid. Aortic valve regurgitation is not visualized. The aortic valve is structurally normal, with no evidence of sclerosis or stenosis. Pulmonic Valve: The pulmonic valve was grossly normal. Pulmonic valve regurgitation is not visualized. Pulmonic regurgitation is not visualized. Aorta: The aortic root is normal in size and structure. Venous: The inferior vena cava is normal in size with greater than 50% respiratory variability, suggesting right atrial pressure of 3 mmHg. IAS/Shunts: No atrial level shunt detected by color flow Doppler. Additional Comments: A prior study was performed on 10/17/2017.  LEFT VENTRICLE PLAX 2D LVIDd:         4.50 cm  Diastology LVIDs:         3.40 cm  LV e' lateral:   8.95 cm/s LV PW:         1.20 cm  LV E/e' lateral: 6.4 LV IVS:        1.10 cm  LV e' medial:    7.09 cm/s LVOT diam:     2.20 cm  LV E/e' medial:  8.0 LV SV:         45 ml LV SV Index:   28.32 LVOT Area:     3.80 cm  RIGHT VENTRICLE RV S prime:     14.40 cm/s TAPSE (M-mode): 1.5 cm LEFT ATRIUM           Index       RIGHT ATRIUM           Index LA diam:      3.20 cm 1.97  cm/m  RA Area:     11.40 cm LA Vol (A2C): 31.0 ml 19.11 ml/m RA Volume:   24.90 ml  15.35 ml/m LA Vol (A4C): 44.3 ml 27.30 ml/m   AORTA Ao Root diam: 3.50 cm MITRAL VALVE MV Area (PHT): 5.42 cm             SHUNTS MV PHT:        40.60 msec           Systemic Diam: 2.20 cm MV Decel Time: 140 msec MV E velocity: 57.00 cm/s 103 cm/s MV A velocity: 91.70 cm/s 70.3 cm/s MV E/A ratio:  0.62       1.5  Eleonore Chiquito MD Electronically signed by Eleonore Chiquito MD Signature Date/Time: 03/05/2019/2:53:46 PM    Final    DG Hip Unilat With Pelvis 2-3 Views Left  Result Date: 03/04/2019 CLINICAL DATA:  [place] today with left hip pain. EXAM: DG HIP (WITH OR WITHOUT PELVIS) 2-3V LEFT COMPARISON:  None. FINDINGS: No evidence of acute pelvic fracture. No evidence of left hip fracture. Old healed right femoral neck fracture  IMPRESSION: No acute or traumatic finding. Electronically Signed   By: Nelson Chimes M.D.   On: 03/04/2019 13:34   ECHOCARDIOGRAM LIMITED  Result Date: 03/06/2019   ECHOCARDIOGRAM LIMITED REPORT   Patient Name:   [name] Date of Exam: 03/06/2019 Medical Rec #:  [id number]       Height:       67.0 in Accession #:    [id number]      Weight:       119.1 lb Date of Birth:  1955-08-18       BSA:          1.62 m Patient Age:    45 years        BP:           111/79 mmHg Patient Gender: M               HR:           74 bpm. Exam Location:  Inpatient  Procedure: Limited Echo and Intracardiac Opacification Agent Indications:    Stroke 434.91 / I163.9  History:        Patient has prior history of Echocardiogram examinations, most                 recent 03/05/2019. Cocaine abuse.  Sonographer:    Tiffany Dance Referring Phys: [id number] [place]  1. Left ventricular ejection fraction, by visual estimation, is 35 to 40%. The left ventricle has moderately decreased function. There is no left ventricular hypertrophy.  2. The left ventricle demonstrates regional wall motion abnormalities.  3. Global right ventricle has normal systolic function.The right ventricular size is normal.  4. Left atrial size was not assessed.  5. Right atrial size was normal.  6. Moderate pleural effusion.  7. The mitral valve was not assessed.  8. The tricuspid valve is not assessed.  9. The aortic valve was not assessed. 10. The pulmonic valve was not assessed. 11. Aortic root could not be assessed. 12. Limited study; no doppler performed; apical akinesis with moderate LV dysfunction; using definity, no obvious thrombus noted. FINDINGS  Left Ventricle: Left ventricular ejection fraction, by visual estimation, is 35 to 40%. The left ventricle has moderately decreased function. The left ventricle demonstrates regional wall motion abnormalities. Right Ventricle: The right ventricular size is normal. Global RV systolic function is  has normal systolic  function. Left Atrium: Left atrial size was not assessed. Right Atrium: Right atrial size was normal in size Pericardium: There is no evidence of pericardial effusion. There is a moderate pleural effusion in either the left or right lateral region. Mitral Valve: The mitral valve was not assessed. Tricuspid Valve: The tricuspid valve is not assessed. Aortic Valve: The aortic valve was not assessed. Pulmonic Valve: The pulmonic valve was not assessed. Aorta: Aortic root could not be assessed.  Additional Comments: Limited study; no doppler performed; apical akinesis with moderate LV dysfunction; using definity, no obvious thrombus noted.  Kirk Ruths MD Electronically signed by Kirk Ruths MD Signature Date/Time: 03/06/2019/3:22:00 PM    Final     Labs:  Basic Metabolic Panel: Recent Labs  Lab 03/07/19 0607 03/13/19 0811  NA 133*  --   K 4.0  --   CL 101  --   CO2 23  --   GLUCOSE 83  --   BUN 10  --   CREATININE 0.93 0.84  CALCIUM 8.8*  --     CBC: Recent Labs  Lab 03/07/19 0607  WBC 6.8  NEUTROABS 5.3  HGB 11.5*  HCT 34.4*  MCV 88.7  PLT 451*    CBG: No results for input(s): GLUCAP in the last 168 hours.  Family history.  Patient unsure noted hypertension hyperlipidemia.  Denies any diabetes mellitus or colon cancer   Brief HPI:   [name] is a 63 y.o. right-handed male with history of polysubstance abuse including cocaine, CAD with stenting as well as CABG 2010 maintained on aspirin, ischemic cardiomyopathy ejection fraction 35%, tobacco abuse and hypertension.  Patient lives alone 1 level apartment independent prior to admission.  He does not have local family and no true contact with them.  Presented 03/04/2019 with left-sided weakness x2 days noted falls due to weakness.  MRI showed multifocal acute ischemia within the right MCA territory.  No hemorrhage or mass-effect.  Admission chemistries unremarkable, alcohol negative, SARS coronavirus  negative, urinalysis negative nitrite, urine drug screen positive cocaine.  CT angiogram of head and neck occlusion of mid M1 segment of the right middle cerebral artery.  Severe stenosis of the cavernous segments of both internal carotid secondary to calcific atherosclerosis.  Echocardiogram with ejection fraction 45% and global hypokinesis.  Neurology consulted maintain on aspirin and Plavix x3 weeks and aspirin alone.  Subcutaneous Lovenox for DVT prophylaxis.  Patient was admitted for a comprehensive rehab program.   Hospital Course: [name] was admitted to rehab 03/06/2019 for inpatient therapies to consist of PT, ST and OT at least three hours five days a week. Past admission physiatrist, therapy team and rehab RN have worked together to provide customized collaborative inpatient rehab.  Pertaining to patient right MCA infarction in the setting of cocaine use.  He remained on aspirin and Plavix x3 weeks total then aspirin alone would follow-up with neurology services.  Pain managed with use of gabapentin 100 mg 3 times daily started on 03/10/2019 as well as Voltaren gel to the left hip.  Lidoderm patch ordered 03/12/2019.  Blood pressures monitored permissive hypertension patient had been on lisinopril prior to admission this could be resumed as needed and follow-up outpatient with his primary MD.  He continued on Lipitor for hyperlipidemia.  History of polysubstance abuse as well as tobacco abuse urine drug screen positive cocaine patient received counts regards to cessation of smoking or illicit drug products it was questionable if he would be compliant with these  request.  Patient bouts of constipation KUB negative for ileus there was a large stool burden patient did receive fleets enema.  Patient with incidental findings of inguinal mass with ultrasound showed oval hypoechoic mass just below the skin over the left inguinal region corresponding to patient's palpable abnormality.  Felt this could  possibly represent abnormal lymph node versus complex fluid collection such as hematoma or subcutaneous abscess although patient remained afebrile and continue to monitor..   Blood pressures were monitored on TID basis and remained somewhat soft  He/ is continent of bowel and bladder.  He/ has made gains during rehab stay and is attending therapies  He/ will continue to receive follow up therapies after discharge  Rehab course: During patient's stay in rehab weekly team conferences were held to monitor patient's progress, set goals and discuss barriers to discharge. At admission, patient required minimal assist ambulate 90 feet standard walker, moderate assist stand pivot transfers, minimal assist supine to sit, minimal assist upper body bathing, moderate assist lower body bathing.  Physical exam.  Blood pressure 128/70.  Pulse 90 respirations 18 oxygen saturations 96% room air Constitutional.  Frail-appearing older man HEENT Head.  Normocephalic and atraumatic Eyes.  Pupils round and reactive to light no nystagmus Neck.  Supple nontender no JVD no thyromegaly Cardiac regular rate rhythm no murmur or extra sounds Respiratory clear to auscultation without wheeze or rails GI.  Soft nontender positive bowel sounds without rebound Musculoskeletal.  Comments.  Right upper right lower extremity 5- out of 5 deltoids biceps triceps wrist extension grip finger flexion hip flexors knee extension ankle dorsi flexion.  Left upper extremity deltoids biceps 3 out of 5 triceps 3- out of 5, wrist extension 3- out of 5, grip 4- out of 5, finger 4- out of 5, left lower extremity hip flexion 2 out of 5, knee extension knee flexion 3 out of 5 dorsi plantar flexion 4 out of 5 Neurological makes good eye contact with examiner prefers to keep his eyes closed follows commands answers questions appropriate limited awareness of his deficits.   He/  has had improvement in activity tolerance, balance, postural control  as well as ability to compensate for deficits. He/ has had improvement in functional use RUE/LUE  and RLE/LLE as well as improvement in awareness.  Patient able to ambulate to the bathroom with close supervision without assistive device ambulates up to 150 feet on level surfaces.  Perform simulated car transfers with supervision.  Gather his belongings for activities delivered and homemaking.  Stressed to patient the need for safety.  Full teaching completed plan discharge to home.  Patient did not want any equipment or any follow-up therapies.       Disposition: Discharge to home    Diet: Regular  Special Instructions: No driving smoking or alcohol or illicit drug use  Aspirin and Plavix x3 weeks total then aspirin alone  Medications at discharge. 1.  Tylenol as needed 2.  Aspirin 81 mg p.o. daily 3.  Plavix 75 mg p.o. daily 4.  Voltaren 2 g 4 times daily to affected area 5.  Neurontin 100 mg p.o. 3 times daily 6.  Lidoderm patch administer over 12 hours every 24 hours 7.  Robaxin 500 mg every 6 hours as needed muscle spasms 8.  Multivitamin daily 9.  NicoDerm patch taper as directed  Discharge Instructions     Ambulatory referral to Neurology   Complete by: As directed    An appointment is requested in approximately 4 weeks  right MCA infarct   Ambulatory referral to Physical Medicine Rehab   Complete by: As directed    Moderate complexity follow-up 1 to 2 weeks right MCA infarction      Allergies as of 03/14/2019   No Known Allergies      Medication List     STOP taking these medications    enoxaparin 40 MG/0.4ML injection Commonly known as: LOVENOX   senna-docusate 8.6-50 MG tablet Commonly known as: Senokot-S       TAKE these medications    acetaminophen 325 MG tablet Commonly known as: TYLENOL Take 2 tablets (650 mg total) by mouth every 4 (four) hours as needed for mild pain (or temp > 37.5 C (99.5 F)).   aspirin 81 MG EC tablet Take 1 tablet (81  mg total) by mouth daily.   atorvastatin 40 MG tablet Commonly known as: LIPITOR Take 1 tablet (40 mg total) by mouth daily at 6 PM.   clopidogrel 75 MG tablet Commonly known as: PLAVIX Take 1 tablet (75 mg total) by mouth daily.   diclofenac Sodium 1 % Gel Commonly known as: VOLTAREN Apply 2 g topically 4 (four) times daily.   gabapentin 100 MG capsule Commonly known as: NEURONTIN Take 1 capsule (100 mg total) by mouth 3 (three) times daily.   lidocaine 5 % Commonly known as: LIDODERM Place 1 patch onto the skin daily. Remove & Discard patch within 12 hours or as directed by MD   methocarbamol 500 MG tablet Commonly known as: ROBAXIN Take 1 tablet (500 mg total) by mouth every 6 (six) hours as needed for muscle spasms.   multivitamin with minerals Tabs tablet Take 1 tablet by mouth daily.   nicotine 21 mg/24hr patch Commonly known as: NICODERM CQ - dosed in mg/24 hours 21 mg patch daily x2 weeks then 14 mg patch daily x3 weeks then 7 mg patch daily x3 weeks and stop What changed:  how much to take how to take this when to take this additional instructions       Follow-up Information     [facility].   Specialty: Family Medicine Contact information: [address] [place] [address] [phone]         Jerline Pain, MD .   Specialty: Cardiology Contact information: [phone] [address] [place] [address] [phone]         Jamse Arn, MD Follow up.   Specialty: Physical Medicine and Rehabilitation Why: Office will call for appointment Contact information: [address] [place] [address] [phone]            Signed: Cathlyn Parsons 03/14/2019, 5:21 AM Patient was seen, face-face, and physical exam performed by me on day of discharge, less than 30 minutes of total time spent.. Please see progress note from day of discharge as well.  Delice Lesch, MD,  ABPMR

## 2019-03-14 ENCOUNTER — Inpatient Hospital Stay (HOSPITAL_COMMUNITY): Payer: Medicaid Other | Admitting: Physical Therapy

## 2019-03-14 ENCOUNTER — Inpatient Hospital Stay (HOSPITAL_COMMUNITY): Payer: Medicaid Other

## 2019-03-14 ENCOUNTER — Inpatient Hospital Stay (HOSPITAL_COMMUNITY): Payer: Medicaid Other | Admitting: Occupational Therapy

## 2019-03-14 ENCOUNTER — Encounter (HOSPITAL_COMMUNITY): Payer: Medicaid Other

## 2019-03-14 MED ORDER — FLEET ENEMA 7-19 GM/118ML RE ENEM
1.0000 | ENEMA | Freq: Every day | RECTAL | Status: DC | PRN
  Administered 2019-03-14: 1 via RECTAL
  Filled 2019-03-14 (×2): qty 1

## 2019-03-14 NOTE — Progress Notes (Signed)
Occupational Therapy Discharge Summary  Patient Details  Name: [name] MRN: [id number] Date of Birth: 1955/08/04     Patient has met 23 of 11 long term goals due to improved activity tolerance, improved balance, ability to compensate for deficits, functional use of  LEFT upper extremity, improved attention, improved awareness and improved coordination.  Patient to discharge at overall Supervision level.  Patient's care partner is independent to provide the necessary physical assistance at discharge.  Pt plans to have his cousin stay with him and help him with transportation and IADLs although no family eduction has been completed.  Pt states he wants to communicate with his cousin only.  Overall, pt has had considerable pain in L hip which has limited his therapy participation and he is eager to get home so he can adjust the amount of time he spends on it in standing and walking. He states he plans to pace himself and go slow.    Reasons goals not met: n/a  Recommendation:  Patient will benefit from ongoing skilled OT services in home health setting to continue to advance functional skills in the area of BADL and iADL, but he has declined services. Pt feels he can manage on his own.  Equipment: No equipment provided  Reasons for discharge: treatment goals met  Patient/family agrees with progress made and goals achieved: Yes  OT Discharge ADL ADL Eating: Independent Grooming: Independent Where Assessed-Grooming: Standing at sink Upper Body Bathing: Setup Where Assessed-Upper Body Bathing: Shower Lower Body Bathing: Setup Where Assessed-Lower Body Bathing: Shower Upper Body Dressing: Setup Where Assessed-Upper Body Dressing: Chair Lower Body Dressing: Setup Where Assessed-Lower Body Dressing: Chair Toileting: Modified independent Where Assessed-Toileting: Glass blower/designer: Distant supervision Armed forces technical officer Method: Human resources officer: Close  supervison Clinical cytogeneticist Method: Optometrist: Energy manager: Close supervision Social research officer, government Method: Heritage manager: Radio broadcast assistant, Grab bars Vision Baseline Vision/History: No visual deficits Patient Visual Report: No change from baseline Vision Assessment?: No apparent visual deficits Perception  Inattention/Neglect: (slight L visual field impairment but has improved to a functional level) Praxis Praxis: Intact Cognition Overall Cognitive Status: Within Functional Limits for tasks assessed(WFL for very basic tasks such as self care and functional mobility) Sensation Sensation Light Touch: Impaired by gross assessment(L hand numbness) Hot/Cold: Appears Intact Stereognosis: Appears Intact Coordination Fine Motor Movements are Fluid and Coordinated: No Coordination and Movement Description: slow on L side but functional for basic ADLs Motor  Motor Motor: Hemiplegia Motor - Skilled Clinical Observations: mild L hemiparesis Mobility    S with RW  Trunk/Postural Assessment  Cervical Assessment Cervical Assessment: Within Functional Limits Thoracic Assessment Thoracic Assessment: Within Functional Limits Lumbar Assessment Lumbar Assessment: Within Functional Limits Postural Control Righting Reactions: improved from admission to functional level for short distance ambulation Postural Limitations: improved from admission to functional level for short distance ambulation  Balance Static Sitting Balance Static Sitting - Level of Assistance: 7: Independent Dynamic Sitting Balance Dynamic Sitting - Level of Assistance: 7: Independent Static Standing Balance Static Standing - Level of Assistance: 7: Independent Dynamic Standing Balance Dynamic Standing - Level of Assistance: 6: Modified independent (Device/Increase time) Extremity/Trunk Assessment RUE Assessment RUE Assessment: Within Functional  Limits LUE Assessment Active Range of Motion (AROM) Comments: WFL General Strength Comments: 4/5, 40 lbs in L hand, 60 in R hand Brunstrum level for arm: Stage V Relative Independence from Synergy Brunstrum level for hand: Stage VI Isolated joint movements   SAGUIER,JULIA 03/14/2019,  12:54 PM

## 2019-03-14 NOTE — Progress Notes (Signed)
Occupational Therapy Session Note  Patient Details  Name: [name] MRN: [id number] Date of Birth: 1955-04-19  Today's Date: 03/14/2019 OT Individual Time: 0301-3143 OT Individual Time Calculation (min): 63 min  and Today's Date: 03/14/2019 OT Missed Time: 12 Minutes Missed Time Reason: Pain;Patient fatigue   Short Term Goals: Week 1:  OT Short Term Goal 1 (Week 1): STG=LTG d/t ELOS      Skilled Therapeutic Interventions/Progress Updates:    Pt received sitting EOB anxious to get up to go to the bathroom.  He ambulated to and from the toilet without AD with distant S and toileted with mod I.  Completed grooming at the sink in standing with mod I.   He talked about how painful his L hip was and how it kept him up all night. Pt was willing to take a walk to the day room but wanted to use his RW.  Pt did use the Nustep for 20 min at a low workload of 2. He stated the motion actually helped his hip.  He was too cold to stay in the gym and wanted to walk back to his room.  Sat in recliner and helped pt to wrap kpad around L hip for heat therapy.   Pt worked on several theraband UE exercises. Kept c/o fatigue so brought pt a cup of coffee.   Pt then continued to work on UE exercises for a few minutes with bands but ultimately was too tired and uncomfortable to continue.   Help pt to adjust his recliner. Belt alarm on and all needs met.   Therapy Documentation Precautions:  Precautions Precautions: Fall Precaution Comments: L weakness and L inattention Restrictions Weight Bearing Restrictions: No  General OT Amount of Missed Time: 12 Minutes Vital Signs:   Pain: Pain Assessment Pain Score: 10-Worst pain ever Pain Type: Acute pain Pain Location: Hip Pain Orientation: Left Pain Descriptors / Indicators: Aching Pain Onset: Gradual Pain Intervention(s): Distraction;RN made aware ADL: ADL Eating: Independent Grooming: Independent Where Assessed-Grooming: Standing at  sink Upper Body Bathing: Setup Where Assessed-Upper Body Bathing: Shower Lower Body Bathing: Setup Where Assessed-Lower Body Bathing: Shower Upper Body Dressing: Setup Where Assessed-Upper Body Dressing: Chair Lower Body Dressing: Setup Where Assessed-Lower Body Dressing: Chair Toileting: Modified independent Where Assessed-Toileting: Glass blower/designer: Distant supervision Armed forces technical officer Method: Human resources officer: Close supervison Clinical cytogeneticist Method: Optometrist: Energy manager: Close supervision Social research officer, government Method: Heritage manager: Radio broadcast assistant, Grab bars  Therapy/Group: Individual Therapy  [place] 03/14/2019, 9:45 AM

## 2019-03-14 NOTE — Progress Notes (Signed)
[facility] PHYSICAL MEDICINE & REHABILITATION PROGRESS NOTE   Subjective/Complaints: Patient seen laying in bed this morning.  He states he slept fairly overnight.  ROS: Denies CP, SOB, N/V/D  Objective:   No results found. No results for input(s): WBC, HGB, HCT, PLT in the last 72 hours. Recent Labs    03/13/19 0811  CREATININE 0.84    Intake/Output Summary (Last 24 hours) at 03/14/2019 1050 Last data filed at 03/14/2019 0730 Gross per 24 hour  Intake 480 ml  Output --  Net 480 ml     Physical Exam: Vital Signs Blood pressure 93/61, pulse 69, temperature 97.9 F (36.6 C), temperature source Oral, resp. rate 18, weight 50 kg, SpO2 97 %. Constitutional: No distress . Vital signs reviewed. HENT: Normocephalic.  Atraumatic. Eyes: EOMI. No discharge. Cardiovascular: No JVD. Respiratory: Normal effort.  No stridor. GI: Non-distended. Skin: Warm and dry.  Intact. Psych: Normal mood.  Normal behavior. Musc: 2 firm inguinal masses, unchanged TTP left greater trochanteric area Neuro: Alert Motor:  LUE: Grossly 4 -/5 proximal distal, stable LLE: Grossly 4/5 proximal to distal  Assessment/Plan: 1. 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  Physiatrist is providing close team supervision and 24 hour management of active medical problems listed below.  Physiatrist and rehab team continue to assess barriers to discharge/monitor patient progress toward functional and medical goals  Care Tool:  Bathing    Body parts bathed by patient: Right arm, Left arm, Chest, Abdomen, Front perineal area, Buttocks, Right upper leg, Left upper leg, Face, Right lower leg, Left lower leg   Body parts bathed by helper: Right lower leg, Left lower leg     Bathing assist Assist Level: Set up assist     Upper Body Dressing/Undressing Upper body dressing    What is the patient wearing?: Pull over shirt    Upper body assist Assist Level: Set up assist    Lower Body Dressing/Undressing Lower body dressing      What is the patient wearing?: Pants     Lower body assist Assist for lower body dressing: Set up assist     Toileting Toileting    Toileting assist Assist for toileting: Independent with assistive device     Transfers Chair/bed transfer  Transfers assist     Chair/bed transfer assist level: Supervision/Verbal cueing     Locomotion Ambulation   Ambulation assist      Assist level: Supervision/Verbal cueing Assistive device: No Device Max distance: 176ft   Walk 10 feet activity   Assist     Assist level: Supervision/Verbal cueing Assistive device: No Device   Walk 50 feet activity   Assist    Assist level: Supervision/Verbal cueing Assistive device: No Device    Walk 150 feet activity   Assist    Assist level: Supervision/Verbal cueing Assistive device: No Device    Walk 10 feet on uneven surface  activity   Assist     Assist level: Contact Guard/Touching assist Assistive device: Walker-rolling   Wheelchair     Assist Will patient use wheelchair at discharge?: No             Wheelchair 50 feet with 2 turns activity    Assist            Wheelchair 150 feet activity     Assist          Blood pressure 93/61,  pulse 69, temperature 97.9 F (36.6 C), temperature source Oral, resp. rate 18, weight 50 kg, SpO2 97 %.  Medical Problem List and Plan: 1.  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  Antithrombotics: -DVT/anticoagulation: Lovenox  Creatinine within normal limits on 12/17             -antiplatelet therapy: Aspirin 81 mg daily and Plavix 75 mg daily x3 weeks then aspirin alone 3. Pain Management: Tylenol as needed  Gabapentin 100 3 times daily started on  12/14  Voltaren gel to left hip  Lidoderm patch ordered on 12/16  Will consider injection for left greater trochanteric bursitis if necessary  ?  Improving 4. Mood: Provide emotional support             -antipsychotic agents: N/A 5. Neuropsych: This patient is capable of making decisions on his own behalf   Discussed with neuropsych-patient appears to be more mobile and cooperative. 6. Skin/Wound Care: Routine skin checks 7. Fluids/Electrolytes/Nutrition: Routine in and outs with follow-up chemistries 8.  Essential hypertension.  Monitor with increased mobility.  Patient on lisinopril 2.5 mg daily prior to admission.  Resume as needed  Relatively soft, but?  Improving on 12/18 9.  History of polysubstance abuse as well as tobacco abuse.  Urine drug screen positive cocaine.    Counsel 10.  Hyperlipidemia.  Lipitor 11. 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  Hyponatremia  Sodium 133 on 12/11, labs pending  Continue to monitor 13.  Acute blood loss anemia  Hemoglobin 11.5 on 12/11  Continue to monitor 14.  Inguinal masses  Ultrasound ordered, pending, will follow up   LOS: 8 days A FACE TO FACE EVALUATION WAS PERFORMED  Katarzyna Wolven Lorie Phenix 03/14/2019, 10:50 AM

## 2019-03-14 NOTE — Progress Notes (Signed)
Physical Therapy Discharge Summary  Patient Details  Name: [name] MRN: [id number] Date of Birth: 1955/04/15  Today's Date: 03/14/2019 PT Individual Time: 6415-8309 PT Individual Time Calculation (min): 10 min   and  Today's Date: 03/14/2019 PT Missed Time: 50 Minutes Missed Time Reason: Other (Comment);Patient unwilling to participate(pt unwilling due to concern for BM urgency)   Patient has met 9 of 9 long term goals due to improved activity tolerance, improved balance, improved postural control, increased strength, ability to compensate for deficits, functional use of  left upper extremity and left lower extremity, improved attention, improved awareness and improved coordination.  Patient to discharge at a wheelchair level Supervision.   Patient's care partner is independent to provide the necessary physical and cognitive assistance at discharge. Patient reports that his cousin will help him at discharge.   All goals met.  Recommendation:  Patient will benefit from ongoing skilled PT services in home health setting to continue to advance safe functional mobility, address ongoing impairments in dynamic standing balance, activity tolerance, L hemibody NMR (UE>LE), L attention, safety awareness, and minimize fall risk.  Equipment: No equipment provided - pt declined receiving RW stating he will not use it at home  Reasons for discharge: treatment goals met and discharge from hospital  Patient/family agrees with progress made and goals achieved: Yes  Skilled Therapeutic Interventions/Progress Updates:  Patient received L sidelying in bed and upon encouraging patient to participate in therapy session he stated "I'm taking the day off.Marland KitchenMarland KitchenI'm not taking no chances" as patient reports he has had some medications to help him have a BM and he is afraid he wont be able to hold it when he feels the urge. Patient states "I'm taking care of me" and despite continued encouragement to  participate in session even if limited and near patient's room he states "I'm not doing exercises today, I'm sorry." Pt then requesting to use bathroom. Supine<>sit, HOB slightly elevated, independently. Sit<>stands to/from EOB and toilet independently. Gait ~84f x2 to/from bathroom independently without AD and no increased postural sway or unsteadiness noted. Performed LB clothing management and peri-care without assistance - pt reported continent of BM but flushed quickly after toileting. Pt left supine in bed with needs in reach and bed alarm on. Due to patient being unable to participate in therapy session today the below information is based on patient's usual performance.   PT Discharge Precautions/Restrictions Precautions Precautions: Fall Restrictions Weight Bearing Restrictions: No Pain Pain Assessment Pain Scale: 0-10 Pain Score: 0-No pain Vision/Perception  Perception Perception: Impaired Inattention/Neglect: Other (comment)(impaired L attention) Praxis Praxis: Intact  Cognition Overall Cognitive Status: Within Functional Limits for tasks assessed Arousal/Alertness: Awake/alert Orientation Level: Oriented X4 Sensation Sensation Light Touch: Impaired by gross assessment(continues to report L arm/hand numbness) Hot/Cold: Not tested Proprioception: Impaired by gross assessment(in L UE/hand) Stereognosis: Not tested Coordination Gross Motor Movements are Fluid and Coordinated: Yes Fine Motor Movements are Fluid and Coordinated: No Coordination and Movement Description: Gross motor movements are WFL for basic functional mobility Motor  Motor Motor: Hemiplegia Motor - Discharge Observations: mild L hemiparesis (UE more impaired than LE)  Mobility Bed Mobility Bed Mobility: Supine to Sit;Sit to Supine;Rolling Right;Rolling Left Rolling Right: Independent Rolling Left: Independent Supine to Sit: Independent Sit to Supine: Independent Transfers Transfers: Sit to  Stand;Stand to Sit;Stand Pivot Transfers Sit to Stand: Independent Stand to Sit: Independent Stand Pivot Transfers: Independent Transfer (Assistive device): None Locomotion  Gait Ambulation: Yes Gait Assistance: Supervision/Verbal cueing Gait Distance (Feet): 200  Feet Assistive device: None Gait Gait: Yes Gait Pattern: Impaired Gait Pattern: Antalgic(due to L hip/side pain) Gait velocity: decreased Stairs / Additional Locomotion Stairs: Yes(pt reports stairs cause increased L hip/side pain) Stairs Assistance: Supervision/Verbal cueing Stair Management Technique: Two rails Number of Stairs: 8 Height of Stairs: 6 Wheelchair Mobility Wheelchair Mobility: No  Trunk/Postural Assessment  Cervical Assessment Cervical Assessment: Within Functional Limits Thoracic Assessment Thoracic Assessment: Within Functional Limits Lumbar Assessment Lumbar Assessment: Within Functional Limits Postural Control Righting Reactions: improved from admission to functional level for short distance ambulation Postural Limitations: improved from admission to functional level for short distance ambulation  Balance Balance Balance Assessed: Yes Static Sitting Balance Static Sitting - Level of Assistance: 7: Independent Dynamic Sitting Balance Dynamic Sitting - Level of Assistance: 7: Independent Static Standing Balance Static Standing - Balance Support: During functional activity Static Standing - Level of Assistance: 7: Independent Dynamic Standing Balance Dynamic Standing - Balance Support: During functional activity Dynamic Standing - Level of Assistance: 6: Modified independent (Device/Increase time) Extremity Assessment  RLE Assessment RLE Assessment: Within Functional Limits LLE Assessment General Strength Comments: Demonstrates grossly 4+/5 during functional mobility    Tawana Scale, PT, DPT 03/14/2019, 3:34 PM

## 2019-03-14 NOTE — Progress Notes (Signed)
Social Work Discharge Note   The overall goal for the admission was met for: DC SAT 12/19  Discharge location: Yes-BACK TO BOARDING HOUSE WITH COUSIN AND FRIENDS CHECKING ON HIM  Length of Stay: Yes-9 DAYS  Discharge activity level: Yes-SUPERVISION-MOD/I LEVEL  Home/community participation: Yes  Services provided included: MD, RD, PT, OT, SLP, RN, Pharmacy, Neuropsych and SW  Financial Services: Medicaid  Follow-up services arranged: Other: PT FEELS NO FOLLOW UP OR EQUIPMENT IS NEEDED.  Comments (or additional information):PT RELUCTANTLY PARTICIPATED IN THERAPIES, LIMITED BY HIP PAIN. WHEN DID PARTICIPATE DID WELL AND AMBULATING WITH NO ASSISTIVE DEVICE. PT TENDS TO DO WHAT HE WANTS TO DO EVEN IF NOT THE BEST FOR HIM.  Patient/Family verbalized understanding of follow-up arrangements: Yes  Individual responsible for coordination of the follow-up plan: SELF  Confirmed correct DME delivered: Elease Hashimoto 03/14/2019    Elease Hashimoto

## 2019-03-14 NOTE — Progress Notes (Signed)
Administered enema after xray of ABD. Had large brown results. Offered oral medication but pt declined at this time. All needs met at this time, call bell in reach.

## 2019-03-14 NOTE — Progress Notes (Signed)
Occupational Therapy Note  Patient Details  Name: [name] MRN: [id number] Date of Birth: [birth year]  Today's Date: 03/14/2019 OT Missed Time: 23 Minutes Missed Time Reason: Patient ill (comment)(vomiting)  Pt received in room reporting just vomited and having pain. RN aware and enters room. Pt declines tx d/t being sick. Exited session with pt seated inbed and RN in room. Will follow up per POC   [facility] 03/14/2019, 11:28 AM

## 2019-03-14 NOTE — Progress Notes (Signed)
Pt called patch to left pain due to pain. When arrived to room pt started vomiting. Reported feeling sick and had pains starting in ABD and moving up to chest x 2 and then went away. Also reports feeling cold. Met pt needs and gave ginger ale to settle stomach. Notified MD of changes and new orders placed.

## 2019-03-15 DIAGNOSIS — R1909 Other intra-abdominal and pelvic swelling, mass and lump: Secondary | ICD-10-CM

## 2019-03-15 DIAGNOSIS — I5042 Chronic combined systolic (congestive) and diastolic (congestive) heart failure: Secondary | ICD-10-CM

## 2019-03-15 DIAGNOSIS — K5901 Slow transit constipation: Secondary | ICD-10-CM

## 2019-03-15 MED ORDER — MAGNESIUM CITRATE PO SOLN: 1.0000 | Freq: Once | ORAL | Status: DC

## 2019-03-15 NOTE — Plan of Care (Signed)
  Problem: Consults Goal: RH STROKE PATIENT EDUCATION Description: See Patient Education module for education specifics  Outcome: Completed/Met   Problem: RH SKIN INTEGRITY Goal: RH STG SKIN FREE OF INFECTION/BREAKDOWN Description: Free of breakdown with min assist Outcome: Completed/Met   Problem: RH SAFETY Goal: RH STG ADHERE TO SAFETY PRECAUTIONS W/ASSISTANCE/DEVICE Description: STG Adhere to Safety Precautions With Assistance/Device. Outcome: Completed/Met   Problem: RH KNOWLEDGE DEFICIT Goal: RH STG INCREASE KNOWLEDGE OF HYPERTENSION Description: Patient able to describe medications to manage blood pressure with cues/handouts Outcome: Completed/Met Goal: RH STG INCREASE KNOWLEDGE OF STROKE PROPHYLAXIS Description: Patient able to verbalize prevention of stroke including diet, exercise, lifestyle modifications, and medication management Outcome: Completed/Met

## 2019-03-15 NOTE — Progress Notes (Addendum)
[facility] PHYSICAL MEDICINE & REHABILITATION PROGRESS NOTE   Subjective/Complaints: Patient seen sitting up in bed this morning.  He states he slept well overnight.  He states he is ready for discharge.  Patient states he had a good bowel movement yesterday.  ROS: Denies CP, SOB, N/V/D  Objective:   DG Abd 1 View  Result Date: 03/14/2019 CLINICAL DATA:  Vomiting. EXAM: ABDOMEN - 1 VIEW COMPARISON:  None. FINDINGS: No abnormal bowel dilatation is noted. Large amount of stool seen throughout the colon. No radio-opaque calculi or other significant radiographic abnormality are seen. IMPRESSION: Large stool burden.  No abnormal bowel dilatation. Electronically Signed   By: Marijo Conception M.D.   On: 03/14/2019 11:51   [place] LT LOWER EXTREM LTD SOFT TISSUE NON VASCULAR  Result Date: 03/14/2019 CLINICAL DATA:  Palpable lump over the left inguinal region. EXAM: ULTRASOUND LEFT LOWER EXTREMITY LIMITED TECHNIQUE: Ultrasound examination of the lower extremity soft tissues was performed in the area of clinical concern. COMPARISON:  None. FINDINGS: Joint Space: Not evaluated. Muscles: Normal. Tendons: Not evaluated. Other Soft Tissue Structures: Ultrasound examination over patient's palpable abnormality in the left inguinal region demonstrates an oval homogeneously hypoechoic mass just below the skin measuring 1.4 x 2.2 x 2.4 cm. This has increased through transmission. There is a normal adjacent 9 mm lymph node. There is no communication with the underlying femoral artery and vein. This likely represents a abnormal inguinal lymph node although a complex fluid collection such as subcutaneous abscess or hematoma could have a similar appearance. There is internal vascularity favoring a solid mass such as abnormal lymph node. IMPRESSION: Oval hypoechoic mass just below the skin over the left inguinal region corresponding to patient's palpable abnormality. This could represent an abnormal lymph node versus complex  fluid collection such as hematoma or subcutaneous abscess. Recommend clinical correlation. Electronically Signed   By: Marin Olp M.D.   On: 03/14/2019 12:46   No results for input(s): WBC, HGB, HCT, PLT in the last 72 hours. Recent Labs    03/13/19 0811  CREATININE 0.84    Intake/Output Summary (Last 24 hours) at 03/15/2019 1126 Last data filed at 03/15/2019 0907 Gross per 24 hour  Intake 360 ml  Output --  Net 360 ml     Physical Exam: Vital Signs Blood pressure (!) 89/64, pulse 72, temperature 98.4 F (36.9 C), temperature source Oral, resp. rate 16, weight 50.4 kg, SpO2 98 %. Constitutional: No distress . Vital signs reviewed. HENT: Normocephalic.  Atraumatic. Eyes: EOMI. No discharge. Cardiovascular: No JVD. Respiratory: Normal effort.  No stridor. GI: Non-distended. Skin: Warm and dry.  Intact. Psych: Normal mood.  Normal behavior. Musc: Firm inguinal mass, decreasing in size Neuro: Alert Motor:  LUE: Grossly 4 -/5 proximal distal, improving LLE: Grossly 4/5 proximal to distal  Assessment/Plan: 1. 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  Physiatrist is providing close team supervision and 24 hour management of active medical problems listed below.  Physiatrist and rehab team continue to assess barriers to discharge/monitor patient progress toward functional and medical goals  Care Tool:  Bathing    Body parts bathed by patient: Right arm, Left arm, Chest, Abdomen, Front perineal area, Buttocks, Right upper leg, Left upper leg, Face, Right lower leg, Left lower leg   Body parts bathed by helper: Right lower leg, Left lower leg     Bathing assist Assist Level:  Set up assist     Upper Body Dressing/Undressing Upper body dressing   What is the patient wearing?: Pull over shirt    Upper body assist Assist Level: Set up  assist    Lower Body Dressing/Undressing Lower body dressing      What is the patient wearing?: Pants     Lower body assist Assist for lower body dressing: Set up assist     Toileting Toileting    Toileting assist Assist for toileting: Independent with assistive device     Transfers Chair/bed transfer  Transfers assist     Chair/bed transfer assist level: Independent     Locomotion Ambulation   Ambulation assist      Assist level: Supervision/Verbal cueing Assistive device: No Device Max distance: 248ft   Walk 10 feet activity   Assist     Assist level: Supervision/Verbal cueing Assistive device: No Device   Walk 50 feet activity   Assist    Assist level: Supervision/Verbal cueing Assistive device: No Device    Walk 150 feet activity   Assist    Assist level: Supervision/Verbal cueing Assistive device: No Device    Walk 10 feet on uneven surface  activity   Assist     Assist level: Contact Guard/Touching assist Assistive device: Walker-rolling   Wheelchair     Assist Will patient use wheelchair at discharge?: No             Wheelchair 50 feet with 2 turns activity    Assist            Wheelchair 150 feet activity     Assist          Blood pressure (!) 89/64, pulse 72, temperature 98.4 F (36.9 C), temperature source Oral, resp. rate 16, weight 50.4 kg, SpO2 98 %.  Medical Problem List and Plan: 1.  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  Antithrombotics: -DVT/anticoagulation: Lovenox  Creatinine within normal limits on 12/17             -antiplatelet therapy: Aspirin 81 mg daily and Plavix 75 mg daily x3 weeks then aspirin alone 3. Pain Management: Tylenol as needed  Gabapentin 100 3 times daily started on 12/14  Voltaren gel to left  hip  Lidoderm patch ordered on 12/16  Improving 4. Mood: Provide emotional support             -antipsychotic agents: N/A 5. Neuropsych: This patient is capable of making decisions on his own behalf   Discussed with neuropsych-patient appears to be more mobile and cooperative. 6. Skin/Wound Care: Routine skin checks 7. Fluids/Electrolytes/Nutrition: Routine in and outs with follow-up chemistries 8.  Essential hypertension.  Monitor with increased mobility.  Patient on lisinopril 2.5 mg daily prior to admission.  Resume as needed  Soft on 12/19 9.  History of polysubstance abuse as well as tobacco abuse.  Urine drug screen positive cocaine.    Counsel 10.  Hyperlipidemia.  Lipitor 11. 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  Hyponatremia  Sodium 133 on 12/11, patient refused labs  Continue to monitor 13.  Acute blood loss anemia  Hemoglobin  11.5 on 12/11  Continue to monitor 14.  Inguinal mass  Ultrasound reviewed  Decreasing in size  Will need to continue to monitor as outpatient and consider further work-up if no self-resolution 15. Slow transit constipation  Patient notes good BM, additional mag citrate ordered   LOS: 9 days A FACE TO FACE EVALUATION WAS PERFORMED  Ashland Wiseman Lorie Phenix 03/15/2019, 11:26 AM

## 2019-03-18 ENCOUNTER — Telehealth: Payer: Self-pay

## 2019-03-18 NOTE — Telephone Encounter (Signed)
  TRANSITIONAL CARE CALL attempted, no answer, left voicemail to return call  Appointment Date and Time: 03-25-2019 / 1040am With: Zella Ball then Dr. Posey Pronto

## 2019-03-19 NOTE — Telephone Encounter (Signed)
Transitional care call completed, Patient states he will come to our office for scheduled appointment.  Transitional Care Questions   Questions for our staff to ask patients on Transitional care 48 hour phone call:   1. Are you/is patient experiencing any problems since coming home? No  Are there any questions regarding any aspect of care?  No  2. Are there any questions regarding medications administration/dosing? No  Are meds being taken as prescribed? Patient should review meds with caller to confirm   3. Have there been any falls?  No  4. Has Home Health been to the house and/or have they contacted you? Patient declines If not, have you tried to contact them? Can we help you contact them?   5. Are bowels and bladder emptying properly? Yes  Are there any unexpected incontinence issues? If applicable, is patient following bowel/bladder programs?   6. Any fevers, problems with breathing, unexpected pain?  No  7. Are there any skin problems or new areas of breakdown?  No  8. Has the patient/family member arranged specialty MD follow up (ie cardiology/neurology/renal/surgical/etc)?  Yes Can we help arrange?   9. Does the patient need any other services or support that we can help arrange? No  10. Are caregivers following through as expected in assisting the patient?    11. Has the patient quit smoking, drinking alcohol, or using drugs as recommended?   Unable to finish questions. Patient said he was busy and did not have time to finish answering quiestions.Patient hung abruptly

## 2019-03-24 ENCOUNTER — Inpatient Hospital Stay (HOSPITAL_COMMUNITY)
Admission: EM | Admit: 2019-03-24 | Discharge: 2019-03-27 | DRG: 988 | Disposition: A | Discharge status: Home Health | Payer: Medicaid Other | Attending: Family Medicine | Admitting: Family Medicine

## 2019-03-24 ENCOUNTER — Other Ambulatory Visit: Payer: Self-pay

## 2019-03-24 ENCOUNTER — Encounter (HOSPITAL_COMMUNITY): Payer: Self-pay | Admitting: Emergency Medicine

## 2019-03-24 ENCOUNTER — Emergency Department (HOSPITAL_COMMUNITY): Payer: Medicaid Other

## 2019-03-24 DIAGNOSIS — Z955 Presence of coronary angioplasty implant and graft: Secondary | ICD-10-CM | POA: Diagnosis not present

## 2019-03-24 DIAGNOSIS — Z7902 Long term (current) use of antithrombotics/antiplatelets: Secondary | ICD-10-CM | POA: Diagnosis not present

## 2019-03-24 DIAGNOSIS — R590 Localized enlarged lymph nodes: Secondary | ICD-10-CM | POA: Diagnosis not present

## 2019-03-24 DIAGNOSIS — Z20828 Contact with and (suspected) exposure to other viral communicable diseases: Secondary | ICD-10-CM | POA: Diagnosis present

## 2019-03-24 DIAGNOSIS — K5900 Constipation, unspecified: Secondary | ICD-10-CM | POA: Diagnosis present

## 2019-03-24 DIAGNOSIS — I255 Ischemic cardiomyopathy: Secondary | ICD-10-CM | POA: Diagnosis present

## 2019-03-24 DIAGNOSIS — C3491 Malignant neoplasm of unspecified part of right bronchus or lung: Principal | ICD-10-CM | POA: Diagnosis present

## 2019-03-24 DIAGNOSIS — I69354 Hemiplegia and hemiparesis following cerebral infarction affecting left non-dominant side: Secondary | ICD-10-CM

## 2019-03-24 DIAGNOSIS — R918 Other nonspecific abnormal finding of lung field: Secondary | ICD-10-CM | POA: Diagnosis present

## 2019-03-24 DIAGNOSIS — R161 Splenomegaly, not elsewhere classified: Secondary | ICD-10-CM

## 2019-03-24 DIAGNOSIS — Z7982 Long term (current) use of aspirin: Secondary | ICD-10-CM | POA: Diagnosis not present

## 2019-03-24 DIAGNOSIS — I5042 Chronic combined systolic (congestive) and diastolic (congestive) heart failure: Secondary | ICD-10-CM | POA: Diagnosis present

## 2019-03-24 DIAGNOSIS — Z951 Presence of aortocoronary bypass graft: Secondary | ICD-10-CM | POA: Diagnosis not present

## 2019-03-24 DIAGNOSIS — Z8673 Personal history of transient ischemic attack (TIA), and cerebral infarction without residual deficits: Secondary | ICD-10-CM | POA: Diagnosis not present

## 2019-03-24 DIAGNOSIS — I2510 Atherosclerotic heart disease of native coronary artery without angina pectoris: Secondary | ICD-10-CM | POA: Diagnosis present

## 2019-03-24 DIAGNOSIS — D4989 Neoplasm of unspecified behavior of other specified sites: Secondary | ICD-10-CM

## 2019-03-24 DIAGNOSIS — I110 Hypertensive heart disease with heart failure: Secondary | ICD-10-CM | POA: Diagnosis present

## 2019-03-24 DIAGNOSIS — Z809 Family history of malignant neoplasm, unspecified: Secondary | ICD-10-CM | POA: Diagnosis not present

## 2019-03-24 DIAGNOSIS — F17210 Nicotine dependence, cigarettes, uncomplicated: Secondary | ICD-10-CM | POA: Diagnosis present

## 2019-03-24 DIAGNOSIS — R1032 Left lower quadrant pain: Secondary | ICD-10-CM | POA: Diagnosis present

## 2019-03-24 LAB — CBC WITH DIFFERENTIAL/PLATELET
Abs Immature Granulocytes: 0.03 10*3/uL (ref 0.00–0.07)
Basophils Absolute: 0 10*3/uL (ref 0.0–0.1)
Basophils Relative: 0 %
Eosinophils Absolute: 0 10*3/uL (ref 0.0–0.5)
Eosinophils Relative: 0 %
HCT: 39.7 % (ref 39.0–52.0)
Hemoglobin: 13 g/dL (ref 13.0–17.0)
Immature Granulocytes: 0 %
Lymphocytes Relative: 8 %
Lymphs Abs: 0.6 10*3/uL — ABNORMAL LOW (ref 0.7–4.0)
MCH: 29.8 pg (ref 26.0–34.0)
MCHC: 32.7 g/dL (ref 30.0–36.0)
MCV: 91.1 fL (ref 80.0–100.0)
Monocytes Absolute: 0.7 10*3/uL (ref 0.1–1.0)
Monocytes Relative: 8 %
Neutro Abs: 7.2 10*3/uL (ref 1.7–7.7)
Neutrophils Relative %: 84 %
Platelets: 452 10*3/uL — ABNORMAL HIGH (ref 150–400)
RBC: 4.36 MIL/uL (ref 4.22–5.81)
RDW: 14 % (ref 11.5–15.5)
WBC: 8.5 10*3/uL (ref 4.0–10.5)
nRBC: 0 % (ref 0.0–0.2)

## 2019-03-24 LAB — COMPREHENSIVE METABOLIC PANEL
ALT: 15 U/L (ref 0–44)
AST: 26 U/L (ref 15–41)
Albumin: 2.8 g/dL — ABNORMAL LOW (ref 3.5–5.0)
Alkaline Phosphatase: 63 U/L (ref 38–126)
Anion gap: 11 (ref 5–15)
BUN: 11 mg/dL (ref 8–23)
CO2: 24 mmol/L (ref 22–32)
Calcium: 9.2 mg/dL (ref 8.9–10.3)
Chloride: 101 mmol/L (ref 98–111)
Creatinine, Ser: 0.85 mg/dL (ref 0.61–1.24)
GFR calc Af Amer: >60 mL/min (ref >60)
GFR calc non Af Amer: >60 mL/min (ref >60)
Glucose, Bld: 101 mg/dL — ABNORMAL HIGH (ref 70–99)
Potassium: 4 mmol/L (ref 3.5–5.1)
Sodium: 136 mmol/L (ref 135–145)
Total Bilirubin: 0.6 mg/dL (ref 0.3–1.2)
Total Protein: 7.4 g/dL (ref 6.5–8.1)

## 2019-03-24 LAB — SARS CORONAVIRUS 2 (TAT 6-24 HRS): SARS Coronavirus 2: NEGATIVE

## 2019-03-24 MED ORDER — GABAPENTIN 100 MG PO CAPS
100.0000 mg | ORAL_CAPSULE | Freq: Three times a day (TID) | ORAL | Status: DC
  Administered 2019-03-25 – 2019-03-27 (×5): 100 mg via ORAL
  Filled 2019-03-24 (×8): qty 1

## 2019-03-24 MED ORDER — SODIUM CHLORIDE 0.9 % IV BOLUS
500.0000 mL | Freq: Once | INTRAVENOUS | Status: AC
  Administered 2019-03-24: 500 mL via INTRAVENOUS

## 2019-03-24 MED ORDER — ATORVASTATIN CALCIUM 40 MG PO TABS
40.0000 mg | ORAL_TABLET | Freq: Every day | ORAL | Status: DC
  Administered 2019-03-26: 40 mg via ORAL
  Filled 2019-03-24 (×2): qty 1

## 2019-03-24 MED ORDER — ONDANSETRON HCL 4 MG PO TABS: 4.0000 mg | ORAL_TABLET | Freq: Four times a day (QID) | ORAL | Status: DC | PRN

## 2019-03-24 MED ORDER — METHOCARBAMOL 500 MG PO TABS: 500.0000 mg | ORAL_TABLET | Freq: Four times a day (QID) | ORAL | Status: DC | PRN

## 2019-03-24 MED ORDER — ACETAMINOPHEN 325 MG PO TABS: 650.0000 mg | ORAL_TABLET | ORAL | Status: DC | PRN

## 2019-03-24 MED ORDER — ONDANSETRON HCL 4 MG/2ML IJ SOLN: 4.0000 mg | Freq: Four times a day (QID) | INTRAMUSCULAR | Status: DC | PRN

## 2019-03-24 MED ORDER — OXYCODONE-ACETAMINOPHEN 5-325 MG PO TABS
1.0000 | ORAL_TABLET | Freq: Four times a day (QID) | ORAL | Status: DC | PRN
  Administered 2019-03-24 – 2019-03-27 (×2): 1 via ORAL
  Filled 2019-03-24 (×2): qty 1

## 2019-03-24 MED ORDER — ONDANSETRON HCL 4 MG/2ML IJ SOLN
4.0000 mg | Freq: Once | INTRAMUSCULAR | Status: AC
  Administered 2019-03-24: 4 mg via INTRAVENOUS
  Filled 2019-03-24: qty 2

## 2019-03-24 MED ORDER — SODIUM CHLORIDE 0.9 % IV SOLN: INTRAVENOUS | Status: DC

## 2019-03-24 MED ORDER — ALBUTEROL SULFATE (2.5 MG/3ML) 0.083% IN NEBU: 2.5000 mg | INHALATION_SOLUTION | RESPIRATORY_TRACT | Status: DC | PRN

## 2019-03-24 MED ORDER — IOHEXOL 300 MG/ML  SOLN
100.0000 mL | Freq: Once | INTRAMUSCULAR | Status: AC | PRN
  Administered 2019-03-24: 100 mL via INTRAVENOUS

## 2019-03-24 MED ORDER — ADULT MULTIVITAMIN W/MINERALS CH
1.0000 | ORAL_TABLET | Freq: Every day | ORAL | Status: DC
  Administered 2019-03-24 – 2019-03-27 (×4): 1 via ORAL
  Filled 2019-03-24 (×4): qty 1

## 2019-03-24 MED ORDER — MORPHINE SULFATE (PF) 2 MG/ML IV SOLN
2.0000 mg | Freq: Once | INTRAVENOUS | Status: AC
  Administered 2019-03-24: 2 mg via INTRAVENOUS
  Filled 2019-03-24: qty 1

## 2019-03-24 NOTE — ED Triage Notes (Signed)
Pt arrives to ED from home with complaints of an abscess in his groin that is painful. Patient states that the abscess has caused him to be weak.

## 2019-03-24 NOTE — ED Notes (Signed)
Pt refused Condom Catheter; stated he would keep using urinal at bedside.

## 2019-03-24 NOTE — ED Provider Notes (Signed)
5:39 PM signout from [place] PA-C at shift change. Patient recently admitted with right MCA territory strokes. He then was in rehab.  Patient presents today with worsening left groin swelling. He states that he could not sleep last night due to severe pain.  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 In addition patient has severe splenomegaly likely due to metastatic disease as well as necrotic lymph nodes in the left groin which are likely the underlying etiology of his symptoms. Patient does endorse recent weight loss. He has been a smoker in the past.  I discussed the findings with the patient. He is willing to be readmitted. Discussed need for biopsy. Currently pain is controlled and patient is comfortable.  On exam patient has 2 to 3 cm area of swelling in the left groin without overlying erythema. Area is very tender to palpation. There are nonvisible palpable lymph nodes as well. Patient is cachectic and thin. Firmness and mild tenderness to palpation noted in the left upper quadrant.  I spoke with Dr. Darrick Meigs who will see patient and arrange for admission.  Results for orders placed or performed during the hospital encounter of 03/24/19  Comprehensive metabolic panel  Result Value Ref Range   Sodium 136 135 - 145 mmol/L   Potassium 4.0 3.5 - 5.1 mmol/L   Chloride 101 98 - 111 mmol/L   CO2 24 22 - 32 mmol/L   Glucose, Bld 101 (H) 70 - 99 mg/dL   BUN 11 8 - 23 mg/dL   Creatinine, Ser 0.85 0.61 - 1.24 mg/dL   Calcium 9.2 8.9 - 10.3 mg/dL   Total Protein 7.4 6.5 - 8.1 g/dL   Albumin 2.8 (L) 3.5 - 5.0 g/dL   AST 26 15 - 41 U/L   ALT 15 0 - 44 U/L   Alkaline Phosphatase 63 38 - 126 U/L   Total Bilirubin 0.6 0.3 - 1.2 mg/dL   GFR calc non Af Amer >60 >60 mL/min   GFR calc Af Amer >60 >60 mL/min   Anion gap 11 5 - 15  CBC with Differential  Result Value Ref Range   WBC 8.5 4.0 - 10.5 K/uL   RBC 4.36 4.22 - 5.81 MIL/uL    Hemoglobin 13.0 13.0 - 17.0 g/dL   HCT 39.7 39.0 - 52.0 %   MCV 91.1 80.0 - 100.0 fL   MCH 29.8 26.0 - 34.0 pg   MCHC 32.7 30.0 - 36.0 g/dL   RDW 14.0 11.5 - 15.5 %   Platelets 452 (H) 150 - 400 K/uL   nRBC 0.0 0.0 - 0.2 %   Neutrophils Relative % 84 %   Neutro Abs 7.2 1.7 - 7.7 K/uL   Lymphocytes Relative 8 %   Lymphs Abs 0.6 (L) 0.7 - 4.0 K/uL   Monocytes Relative 8 %   Monocytes Absolute 0.7 0.1 - 1.0 K/uL   Eosinophils Relative 0 %   Eosinophils Absolute 0.0 0.0 - 0.5 K/uL   Basophils Relative 0 %   Basophils Absolute 0.0 0.0 - 0.1 K/uL   Immature Granulocytes 0 %   Abs Immature Granulocytes 0.03 0.00 - 0.07 K/uL    CT ABDOMEN PELVIS W CONTRAST  Result Date: 03/24/2019 CLINICAL DATA:  Left lower quadrant abdominal pain. Painful left inguinal mass. EXAM: CT ABDOMEN AND PELVIS WITH CONTRAST TECHNIQUE: Multidetector CT imaging of the abdomen and pelvis was performed using the standard protocol following bolus administration of  intravenous contrast. CONTRAST:  138mL OMNIPAQUE IOHEXOL 300 MG/ML  SOLN COMPARISON:  03/04/2019 chest radiograph. FINDINGS: Lower chest: Incompletely visualized large masslike focus of consolidation at the central right lung base involving right lower and right middle lobes measuring at least 8.0 x 6.1 cm (series 3/image 1). Separate 1.1 cm solid basilar right lower lobe pulmonary nodule (series 4/image 6). Coronary atherosclerosis. Hepatobiliary: Normal liver size. Subcentimeter hypodense segment 7 right liver lesion (series 3/image 20), too small to characterize. No additional liver lesions. Normal gallbladder with no radiopaque cholelithiasis. No biliary ductal dilatation. Pancreas: No pancreatic mass. Borderline prominent main pancreatic duct (3 mm diameter). Spleen: Enlarged markedly abnormal spleen (15 cm craniocaudal length) with markedly heterogeneous soft tissue density. Adrenals/Urinary Tract: No discrete adrenal nodules. Normal kidneys with no  hydronephrosis and no renal mass. Normal bladder. Stomach/Bowel: Normal non-distended stomach. Normal caliber small bowel with no small bowel wall thickening. Appendix not discretely visualized. No pericecal inflammatory changes. Normal large bowel with no diverticulosis, large bowel wall thickening or pericolonic fat stranding. Vascular/Lymphatic: Normal caliber abdominal aorta. Patent portal, splenic, hepatic and renal veins. Enlarged centrally necrotic left inguinal lymph nodes, largest 2.6 cm short axis diameter (series 3/image 71). Enlarged centrally necrotic 2.5 cm left external iliac lymph node (series 3/image 63). Enlarged centrally necrotic 2.3 cm left common iliac node (series 3/image 56). Enlarged centrally necrotic left para-aortic lymph nodes up to 2.9 cm short axis diameter (series 3/image 35). Reproductive: Top-normal size prostate. Other: No pneumoperitoneum. No focal fluid collections. Small volume pelvic free fluid. Musculoskeletal: No aggressive appearing focal osseous lesions. Mild-to-moderate thoracolumbar spondylosis. IMPRESSION: 1. Incompletely imaged large 8 cm masslike focus of consolidation in the central right lung, cannot exclude primary bronchogenic carcinoma. Separate 1.1 cm right lung base solid pulmonary nodule. Dedicated chest CT with IV contrast recommended for further characterization. 2. Markedly abnormal enlarged spleen with heterogeneous soft tissue attenuation, nonspecific, suspicious for metastatic disease. 3. Centrally necrotic lymphadenopathy in the left para-aortic, left common iliac, left external iliac and left inguinal chains, suspicious for metastatic disease. The left inguinal adenopathy should be amenable to percutaneous biopsy. 4. Subcentimeter low-attenuation segment 7 right liver lesion, too small to characterize, for which follow-up imaging is advised. 5. Small volume pelvic free fluid. Electronically Signed   By: Ilona Sorrel M.D.   On: 03/24/2019 17:20       Carlisle Cater, PA-C 03/24/19 1746    Long, [facility], MD 03/25/19 2028

## 2019-03-24 NOTE — Progress Notes (Signed)
New Admission Note:  Arrival Method: Stretcher Mental Orientation: Alert and oriented x 4. Irritated.  Telemetry: N/A Assessment: Completed Skin: Warm and dry. Refused to get his feet checked IV: NSL  Pain: Resting at the moment. Tubes: N/A Safety Measures: Safety Fall Prevention Plan initiated.  Admission: Completed 5 M  Orientation: Patient has been orientated to the room, unit and the staff. Welcome booklet given.  Family: None  Orders have been reviewed and implemented. Will continue to monitor the patient. Call light has been placed within reach and bed alarm has been activated.   Sima Matas BSN, RN  Phone Number: [phone]

## 2019-03-24 NOTE — ED Notes (Signed)
ED TO INPATIENT HANDOFF REPORT  ED Nurse Name and Phone #: Lunette Stands [place] Name/Age/Gender [name] 63 y.o. male Room/Bed: 020C/020C  Code Status   Code Status: Prior  Home/SNF/Other Home Patient oriented to: self, place, time and situation Is this baseline? Yes   Triage Complete: Triage complete  Chief Complaint Lung mass [R91.8]  Triage Note Pt arrives to ED from home with complaints of an abscess in his groin that is painful. Patient states that the abscess has caused him to be weak.     Allergies No Known Allergies  Level of Care/Admitting Diagnosis ED Disposition    ED Disposition Condition [facility] Area: [address] [100100]  Level of Care: Med-Surg [16]  Covid Evaluation: Asymptomatic Screening Protocol (No Symptoms)  Diagnosis: Lung mass [810175]  Admitting Physician: Oswald Hillock [[id number]]  Attending Physician: Oswald Hillock [[id number]]  Estimated length of stay: 3 - 4 days  Certification:: I certify this patient will need inpatient services for at least 2 midnights       B Medical/Surgery History Past Medical History:  Diagnosis Date  . CAD (coronary artery disease)    a. 04/2008 Cath: LM nl, LAD 50p, 47m, LCX min irregs, RCA 50p/m, 40d, RPDA 60-70ost; b. 04/2008 CABG x 4: LIMA->LAD, VG->D2, VG->RPDA->RPL.  . Cocaine abuse ([place])    a. Quit early 2019.  . Ischemic cardiomyopathy    a. LV gram: EF 30-35%, apical/periapical AK.  . Tobacco abuse    Past Surgical History:  Procedure Laterality Date  . BYPASS GRAFT    . CARDIAC SURGERY       A IV Location/Drains/Wounds Patient Lines/Drains/Airways Status   Active Line/Drains/Airways    Name:   Placement date:   Placement time:   Site:   Days:   Peripheral IV 03/24/19 Right Forearm   03/24/19    1451    Forearm   less than 1          Intake/Output Last 24 hours No intake or output data in the 24 hours ending 03/24/19 1823  Labs/Imaging Results for  orders placed or performed during the hospital encounter of 03/24/19 (from the past 48 hour(s))  Comprehensive metabolic panel     Status: Abnormal   Collection Time: 03/24/19  2:20 PM  Result Value Ref Range   Sodium 136 135 - 145 mmol/L   Potassium 4.0 3.5 - 5.1 mmol/L   Chloride 101 98 - 111 mmol/L   CO2 24 22 - 32 mmol/L   Glucose, Bld 101 (H) 70 - 99 mg/dL   BUN 11 8 - 23 mg/dL   Creatinine, Ser 0.85 0.61 - 1.24 mg/dL   Calcium 9.2 8.9 - 10.3 mg/dL   Total Protein 7.4 6.5 - 8.1 g/dL   Albumin 2.8 (L) 3.5 - 5.0 g/dL   AST 26 15 - 41 U/L   ALT 15 0 - 44 U/L   Alkaline Phosphatase 63 38 - 126 U/L   Total Bilirubin 0.6 0.3 - 1.2 mg/dL   GFR calc non Af Amer >60 >60 mL/min   GFR calc Af Amer >60 >60 mL/min   Anion gap 11 5 - 15    Comment: Performed at [facility], [address], [place], [place] [address]  CBC with Differential     Status: Abnormal   Collection Time: 03/24/19  2:20 PM  Result Value Ref Range   WBC 8.5 4.0 - 10.5 K/uL  RBC 4.36 4.22 - 5.81 MIL/uL   Hemoglobin 13.0 13.0 - 17.0 g/dL   HCT 39.7 39.0 - 52.0 %   MCV 91.1 80.0 - 100.0 fL   MCH 29.8 26.0 - 34.0 pg   MCHC 32.7 30.0 - 36.0 g/dL   RDW 14.0 11.5 - 15.5 %   Platelets 452 (H) 150 - 400 K/uL   nRBC 0.0 0.0 - 0.2 %   Neutrophils Relative % 84 %   Neutro Abs 7.2 1.7 - 7.7 K/uL   Lymphocytes Relative 8 %   Lymphs Abs 0.6 (L) 0.7 - 4.0 K/uL   Monocytes Relative 8 %   Monocytes Absolute 0.7 0.1 - 1.0 K/uL   Eosinophils Relative 0 %   Eosinophils Absolute 0.0 0.0 - 0.5 K/uL   Basophils Relative 0 %   Basophils Absolute 0.0 0.0 - 0.1 K/uL   Immature Granulocytes 0 %   Abs Immature Granulocytes 0.03 0.00 - 0.07 K/uL    Comment: Performed at [facility] [address], [place], [place] [address]   CT ABDOMEN PELVIS W CONTRAST  Result Date: 03/24/2019 CLINICAL DATA:  Left lower quadrant abdominal pain. Painful left inguinal mass. EXAM: CT ABDOMEN AND PELVIS WITH CONTRAST TECHNIQUE:  Multidetector CT imaging of the abdomen and pelvis was performed using the standard protocol following bolus administration of intravenous contrast. CONTRAST:  125mL OMNIPAQUE IOHEXOL 300 MG/ML  SOLN COMPARISON:  03/04/2019 chest radiograph. FINDINGS: Lower chest: Incompletely visualized large masslike focus of consolidation at the central right lung base involving right lower and right middle lobes measuring at least 8.0 x 6.1 cm (series 3/image 1). Separate 1.1 cm solid basilar right lower lobe pulmonary nodule (series 4/image 6). Coronary atherosclerosis. Hepatobiliary: Normal liver size. Subcentimeter hypodense segment 7 right liver lesion (series 3/image 20), too small to characterize. No additional liver lesions. Normal gallbladder with no radiopaque cholelithiasis. No biliary ductal dilatation. Pancreas: No pancreatic mass. Borderline prominent main pancreatic duct (3 mm diameter). Spleen: Enlarged markedly abnormal spleen (15 cm craniocaudal length) with markedly heterogeneous soft tissue density. Adrenals/Urinary Tract: No discrete adrenal nodules. Normal kidneys with no hydronephrosis and no renal mass. Normal bladder. Stomach/Bowel: Normal non-distended stomach. Normal caliber small bowel with no small bowel wall thickening. Appendix not discretely visualized. No pericecal inflammatory changes. Normal large bowel with no diverticulosis, large bowel wall thickening or pericolonic fat stranding. Vascular/Lymphatic: Normal caliber abdominal aorta. Patent portal, splenic, hepatic and renal veins. Enlarged centrally necrotic left inguinal lymph nodes, largest 2.6 cm short axis diameter (series 3/image 71). Enlarged centrally necrotic 2.5 cm left external iliac lymph node (series 3/image 63). Enlarged centrally necrotic 2.3 cm left common iliac node (series 3/image 56). Enlarged centrally necrotic left para-aortic lymph nodes up to 2.9 cm short axis diameter (series 3/image 35). Reproductive: Top-normal size  prostate. Other: No pneumoperitoneum. No focal fluid collections. Small volume pelvic free fluid. Musculoskeletal: No aggressive appearing focal osseous lesions. Mild-to-moderate thoracolumbar spondylosis. IMPRESSION: 1. Incompletely imaged large 8 cm masslike focus of consolidation in the central right lung, cannot exclude primary bronchogenic carcinoma. Separate 1.1 cm right lung base solid pulmonary nodule. Dedicated chest CT with IV contrast recommended for further characterization. 2. Markedly abnormal enlarged spleen with heterogeneous soft tissue attenuation, nonspecific, suspicious for metastatic disease. 3. Centrally necrotic lymphadenopathy in the left para-aortic, left common iliac, left external iliac and left inguinal chains, suspicious for metastatic disease. The left inguinal adenopathy should be amenable to percutaneous biopsy. 4. Subcentimeter low-attenuation segment 7 right liver lesion, too  small to characterize, for which follow-up imaging is advised. 5. Small volume pelvic free fluid. Electronically Signed   By: Ilona Sorrel M.D.   On: 03/24/2019 17:20    Pending Labs Unresulted Labs (From admission, onward)    Start     Ordered   03/24/19 1740  SARS CORONAVIRUS 2 (TAT 6-24 HRS) Nasopharyngeal Nasopharyngeal Swab  (Tier 3 (TAT 6-24 hrs))  Once,   STAT    Question Answer Comment  Is this test for diagnosis or screening Screening   Symptomatic for COVID-19 as defined by CDC No   Hospitalized for COVID-19 No   Admitted to ICU for COVID-19 No   Previously tested for COVID-19 Yes   Resident in a congregate (group) care setting No   Employed in healthcare setting No      03/24/19 1739   Signed and Held  CBC  (enoxaparin (LOVENOX)    CrCl >/= 30 ml/min)  Once,   R    Comments: Baseline for enoxaparin therapy IF NOT ALREADY DRAWN.  Notify MD if PLT < 100 K.    Signed and Held   Signed and Held  Creatinine, serum  (enoxaparin (LOVENOX)    CrCl >/= 30 ml/min)  Once,   R    Comments:  Baseline for enoxaparin therapy IF NOT ALREADY DRAWN.    Signed and Held   Signed and Held  Creatinine, serum  (enoxaparin (LOVENOX)    CrCl >/= 30 ml/min)  Weekly,   R    Comments: while on enoxaparin therapy    Signed and Held   Signed and Held  CBC  Tomorrow morning,   R     Signed and Held   Signed and Held  Comprehensive metabolic panel  Tomorrow morning,   R     Signed and Held          Vitals/Pain Today's Vitals   03/24/19 1430 03/24/19 1500 03/24/19 1615 03/24/19 1630  BP: 125/84 108/83 120/81   Pulse: 85 79  82  Resp: 19 14 17 15   Temp:      TempSrc:      SpO2: 96% 94%  94%  PainSc:        Isolation Precautions No active isolations  Medications Medications  morphine 2 MG/ML injection 2 mg (2 mg Intravenous Given 03/24/19 1452)  ondansetron (ZOFRAN) injection 4 mg (4 mg Intravenous Given 03/24/19 1452)  iohexol (OMNIPAQUE) 300 MG/ML solution 100 mL (100 mLs Intravenous Contrast Given 03/24/19 1640)  sodium chloride 0.9 % bolus 500 mL (500 mLs Intravenous New Bag/Given 03/24/19 1757)    Mobility walks High fall risk    R Recommendations: See Admitting Provider Note  Report given to:   Additional Notes: N/A

## 2019-03-24 NOTE — H&P (Signed)
TRH H&P    Patient Demographics:    [name], is a 63 y.o. male  MRN: [id number]  DOB - [birth year]  Admit Date - 03/24/2019  Referring MD/NP/PA: Carlisle Cater  Outpatient Primary MD for the patient is [facility]  Patient coming from: Home  Chief complaint-swelling in the groin   HPI:    [name]  is a 63 y.o. male, with history of polysubstance abuse including cocaine, CAD with stenting as well as CABG in 2010 on aspirin, ischemic cardiomyopathy EF 35%, tobacco abuse, hypertension who was recently discharged to rehab for acute to subacute CVA.  Neurology saw the patient and started him on aspirin Plavix for 3 weeks followed by aspirin alone.  Patient was discharged from the rehab on 03/14/2019. Today patient came to the ED as he developed pain in the left groin masses.  Patient says that he has had masses in the left groin for a while and became painful.  Denies fever or chills.  No nausea vomiting or diarrhea.  Denies chest pain or shortness of breath.  Denies dysuria.  He still has some weakness in left upper extremity from previous stroke. In the ED, CT of the abdomen pelvis showed incompletely imaged large 8 cm masslike focus of consolidation in the central right lung cannot exclude primary bronchogenic carcinoma.  Also separate 1.1 cm right lung base solid pulmonary nodule.  CT chest with IV contrast recommended.  Also showed markedly abnormal enlarged spleen with regional soft tissue attenuation suspicious for metastatic disease.  Central necrotic lymphadenopathy in the left para-aortic left, the like left external leg and left inguinal chains.  Suspicious for metastatic disease.  Left inguinal adenopathy should be amenable to percutaneous biopsy.  Subcentimeter low-attenuation segment 7 right liver lesion.    Review of systems:    In addition to the HPI above,    All  other systems reviewed and are negative.    Past History of the following :    Past Medical History:  Diagnosis Date  . CAD (coronary artery disease)    a. 04/2008 Cath: LM nl, LAD 50p, 69m, LCX min irregs, RCA 50p/m, 40d, RPDA 60-70ost; b. 04/2008 CABG x 4: LIMA->LAD, VG->D2, VG->RPDA->RPL.  . Cocaine abuse ([place])    a. Quit early 2019.  . Ischemic cardiomyopathy    a. LV gram: EF 30-35%, apical/periapical AK.  . Tobacco abuse       Past Surgical History:  Procedure Laterality Date  . BYPASS GRAFT    . CARDIAC SURGERY        Social History:      Social History   Tobacco Use  . Smoking status: Current Every Day Smoker    Packs/day: 0.30    Types: Cigarettes  . Smokeless tobacco: Current User  . Tobacco comment: has smoked for 40+ yrs - at most 1ppd, currently 1ppwk.  Substance Use Topics  . Alcohol use: No    Comment: quite > 30 yrs ago.       Family History :  Patient sister died of cancer.   Home Medications:   Prior to Admission medications   Medication Sig Start Date End Date Taking? Authorizing Provider  acetaminophen (TYLENOL) 325 MG tablet Take 2 tablets (650 mg total) by mouth every 4 (four) hours as needed for mild pain (or temp > 37.5 C (99.5 F)). 03/06/19   Pokhrel, Corrie Mckusick, MD  aspirin EC 81 MG EC tablet Take 1 tablet (81 mg total) by mouth daily. Patient not taking: Reported on 03/04/2019 10/18/17   Molli Hazard A, DO  atorvastatin (LIPITOR) 40 MG tablet Take 1 tablet (40 mg total) by mouth daily at 6 PM. 03/13/19 03/07/20  Angiulli, Lavon Paganini, PA-C  clopidogrel (PLAVIX) 75 MG tablet Take 1 tablet (75 mg total) by mouth daily. 03/13/19   Angiulli, Lavon Paganini, PA-C  diclofenac Sodium (VOLTAREN) 1 % GEL Apply 2 g topically 4 (four) times daily. 03/13/19   Angiulli, Lavon Paganini, PA-C  gabapentin (NEURONTIN) 100 MG capsule Take 1 capsule (100 mg total) by mouth 3 (three) times daily. 03/13/19   Angiulli, Lavon Paganini, PA-C  lidocaine (LIDODERM) 5 % Place 1 patch  onto the skin daily. Remove & Discard patch within 12 hours or as directed by MD 03/13/19   Angiulli, Lavon Paganini, PA-C  methocarbamol (ROBAXIN) 500 MG tablet Take 1 tablet (500 mg total) by mouth every 6 (six) hours as needed for muscle spasms. 03/13/19   Angiulli, Lavon Paganini, PA-C  Multiple Vitamin (MULTIVITAMIN WITH MINERALS) TABS tablet Take 1 tablet by mouth daily. 03/14/19   Angiulli, Lavon Paganini, PA-C  nicotine (NICODERM CQ - DOSED IN MG/24 HOURS) 21 mg/24hr patch 21 mg patch daily x2 weeks then 14 mg patch daily x3 weeks then 7 mg patch daily x3 weeks and stop 03/13/19   Angiulli, Lavon Paganini, PA-C     Allergies:    No Known Allergies   Physical Exam:   Vitals  Blood pressure 120/81, pulse 82, temperature 98.6 F (37 C), temperature source Oral, resp. rate 15, SpO2 94 %.  1.  General: Appears in no acute distress  2. Psychiatric: Alert, oriented x3, intact insight and judgment  3. Neurologic: Cranial nerves II through XII grossly intact, motor strength 5/5 in all extremities, no focal deficit noted.  4. HEENMT:  Atraumatic normocephalic, extraocular muscles are intact  5. Respiratory : Clear to auscultation bilaterally, no wheezing or crackles  6. Cardiovascular : S1-S2, regular, no murmur auscultated  7. Gastrointestinal:  Abdomen soft, nontender, no organomegaly  8.  Genitourinary-large fixed palpable masses noted in the left inguinal region as well as left thigh.  Tender to palpation, no overlying erythema noted.    Data Review:    CBC Recent Labs  Lab 03/24/19 1420  WBC 8.5  HGB 13.0  HCT 39.7  PLT 452*  MCV 91.1  MCH 29.8  MCHC 32.7  RDW 14.0  LYMPHSABS 0.6*  MONOABS 0.7  EOSABS 0.0  BASOSABS 0.0   ------------------------------------------------------------------------------------------------------------------  Results for orders placed or performed during the hospital encounter of 03/24/19 (from the past 48 hour(s))  Comprehensive metabolic panel      Status: Abnormal   Collection Time: 03/24/19  2:20 PM  Result Value Ref Range   Sodium 136 135 - 145 mmol/L   Potassium 4.0 3.5 - 5.1 mmol/L   Chloride 101 98 - 111 mmol/L   CO2 24 22 - 32 mmol/L   Glucose, Bld 101 (H) 70 - 99 mg/dL   BUN 11 8 - 23 mg/dL  Creatinine, Ser 0.85 0.61 - 1.24 mg/dL   Calcium 9.2 8.9 - 10.3 mg/dL   Total Protein 7.4 6.5 - 8.1 g/dL   Albumin 2.8 (L) 3.5 - 5.0 g/dL   AST 26 15 - 41 U/L   ALT 15 0 - 44 U/L   Alkaline Phosphatase 63 38 - 126 U/L   Total Bilirubin 0.6 0.3 - 1.2 mg/dL   GFR calc non Af Amer >60 >60 mL/min   GFR calc Af Amer >60 >60 mL/min   Anion gap 11 5 - 15    Comment: Performed at [facility] [address], [place], [place] [address]  CBC with Differential     Status: Abnormal   Collection Time: 03/24/19  2:20 PM  Result Value Ref Range   WBC 8.5 4.0 - 10.5 K/uL   RBC 4.36 4.22 - 5.81 MIL/uL   Hemoglobin 13.0 13.0 - 17.0 g/dL   HCT 39.7 39.0 - 52.0 %   MCV 91.1 80.0 - 100.0 fL   MCH 29.8 26.0 - 34.0 pg   MCHC 32.7 30.0 - 36.0 g/dL   RDW 14.0 11.5 - 15.5 %   Platelets 452 (H) 150 - 400 K/uL   nRBC 0.0 0.0 - 0.2 %   Neutrophils Relative % 84 %   Neutro Abs 7.2 1.7 - 7.7 K/uL   Lymphocytes Relative 8 %   Lymphs Abs 0.6 (L) 0.7 - 4.0 K/uL   Monocytes Relative 8 %   Monocytes Absolute 0.7 0.1 - 1.0 K/uL   Eosinophils Relative 0 %   Eosinophils Absolute 0.0 0.0 - 0.5 K/uL   Basophils Relative 0 %   Basophils Absolute 0.0 0.0 - 0.1 K/uL   Immature Granulocytes 0 %   Abs Immature Granulocytes 0.03 0.00 - 0.07 K/uL    Comment: Performed at [facility] [address], [place], [place] [address]    Chemistries  Recent Labs  Lab 03/24/19 1420  NA 136  K 4.0  CL 101  CO2 24  GLUCOSE 101*  BUN 11  CREATININE 0.85  CALCIUM 9.2  AST 26  ALT 15  ALKPHOS 63  BILITOT 0.6    ------------------------------------------------------------------------------------------------------------------  ------------------------------------------------------------------------------------------------------------------ GFR: Estimated Creatinine Clearance: 63.4 mL/min (by C-G formula based on SCr of 0.85 mg/dL). Liver Function Tests: Recent Labs  Lab 03/24/19 1420  AST 26  ALT 15  ALKPHOS 63  BILITOT 0.6  PROT 7.4  ALBUMIN 2.8*   No results for input(s): LIPASE, AMYLASE in the last 168 hours. No results for input(s): AMMONIA in the last 168 hours. Coagulation Profile: No results for input(s): INR, PROTIME in the last 168 hours. Cardiac Enzymes: No results for input(s): CKTOTAL, CKMB, CKMBINDEX, TROPONINI in the last 168 hours. BNP (last 3 results) No results for input(s): PROBNP in the last 8760 hours. HbA1C: No results for input(s): HGBA1C in the last 72 hours. CBG: No results for input(s): GLUCAP in the last 168 hours. Lipid Profile: No results for input(s): CHOL, HDL, LDLCALC, TRIG, CHOLHDL, LDLDIRECT in the last 72 hours. Thyroid Function Tests: No results for input(s): TSH, T4TOTAL, FREET4, T3FREE, THYROIDAB in the last 72 hours. Anemia Panel: No results for input(s): VITAMINB12, FOLATE, FERRITIN, TIBC, IRON, RETICCTPCT in the last 72 hours.  --------------------------------------------------------------------------------------------------------------- Urine analysis:    Component Value Date/Time   COLORURINE YELLOW 03/04/2019 1658   APPEARANCEUR CLEAR 03/04/2019 1658   LABSPEC 1.027 03/04/2019 1658   PHURINE 6.0 03/04/2019 1658   GLUCOSEU NEGATIVE 03/04/2019 1658  HGBUR NEGATIVE 03/04/2019 [place] 03/04/2019 1658   KETONESUR NEGATIVE 03/04/2019 1658   PROTEINUR 30 (A) 03/04/2019 1658   UROBILINOGEN 1.0 04/30/2008 0423   NITRITE NEGATIVE 03/04/2019 1658   LEUKOCYTESUR NEGATIVE 03/04/2019 1658      Imaging Results:    CT  ABDOMEN PELVIS W CONTRAST  Result Date: 03/24/2019 CLINICAL DATA:  Left lower quadrant abdominal pain. Painful left inguinal mass. EXAM: CT ABDOMEN AND PELVIS WITH CONTRAST TECHNIQUE: Multidetector CT imaging of the abdomen and pelvis was performed using the standard protocol following bolus administration of intravenous contrast. CONTRAST:  198mL OMNIPAQUE IOHEXOL 300 MG/ML  SOLN COMPARISON:  03/04/2019 chest radiograph. FINDINGS: Lower chest: Incompletely visualized large masslike focus of consolidation at the central right lung base involving right lower and right middle lobes measuring at least 8.0 x 6.1 cm (series 3/image 1). Separate 1.1 cm solid basilar right lower lobe pulmonary nodule (series 4/image 6). Coronary atherosclerosis. Hepatobiliary: Normal liver size. Subcentimeter hypodense segment 7 right liver lesion (series 3/image 20), too small to characterize. No additional liver lesions. Normal gallbladder with no radiopaque cholelithiasis. No biliary ductal dilatation. Pancreas: No pancreatic mass. Borderline prominent main pancreatic duct (3 mm diameter). Spleen: Enlarged markedly abnormal spleen (15 cm craniocaudal length) with markedly heterogeneous soft tissue density. Adrenals/Urinary Tract: No discrete adrenal nodules. Normal kidneys with no hydronephrosis and no renal mass. Normal bladder. Stomach/Bowel: Normal non-distended stomach. Normal caliber small bowel with no small bowel wall thickening. Appendix not discretely visualized. No pericecal inflammatory changes. Normal large bowel with no diverticulosis, large bowel wall thickening or pericolonic fat stranding. Vascular/Lymphatic: Normal caliber abdominal aorta. Patent portal, splenic, hepatic and renal veins. Enlarged centrally necrotic left inguinal lymph nodes, largest 2.6 cm short axis diameter (series 3/image 71). Enlarged centrally necrotic 2.5 cm left external iliac lymph node (series 3/image 63). Enlarged centrally necrotic 2.3 cm  left common iliac node (series 3/image 56). Enlarged centrally necrotic left para-aortic lymph nodes up to 2.9 cm short axis diameter (series 3/image 35). Reproductive: Top-normal size prostate. Other: No pneumoperitoneum. No focal fluid collections. Small volume pelvic free fluid. Musculoskeletal: No aggressive appearing focal osseous lesions. Mild-to-moderate thoracolumbar spondylosis. IMPRESSION: 1. Incompletely imaged large 8 cm masslike focus of consolidation in the central right lung, cannot exclude primary bronchogenic carcinoma. Separate 1.1 cm right lung base solid pulmonary nodule. Dedicated chest CT with IV contrast recommended for further characterization. 2. Markedly abnormal enlarged spleen with heterogeneous soft tissue attenuation, nonspecific, suspicious for metastatic disease. 3. Centrally necrotic lymphadenopathy in the left para-aortic, left common iliac, left external iliac and left inguinal chains, suspicious for metastatic disease. The left inguinal adenopathy should be amenable to percutaneous biopsy. 4. Subcentimeter low-attenuation segment 7 right liver lesion, too small to characterize, for which follow-up imaging is advised. 5. Small volume pelvic free fluid. Electronically Signed   By: Ilona Sorrel M.D.   On: 03/24/2019 17:20       Assessment & Plan:    Active Problems:   Lung mass   1. Lung mass-seen on CT abdomen/pelvis, patient will need dedicated IV CT chest with contrast.  Would avoid giving IV contrast at this time as patient already had IV contrast for the CT abdomen pelvis.  I have ordered CT chest with contrast for tomorrow morning.  2. Left inguinal adenopathy-likely malignant.  CT abdomen pelvis shows large spleen, mass to liver and multiple lymph nodes.  Left inguinal adenopathy is unable to biopsy.  Will request IR for biopsy from left inguinal  node in a.m.  We will keep him n.p.o. after midnight.  Hold aspirin, Plavix and Lovenox at this time.  3. History of  CVA-patient is on aspirin, Plavix.  Will hold these medications in anticipation for inguinal node biopsy in a.m.  4. History of CAD/status post CABG-stable, hold aspirin and Plavix.  Continue atorvastatin.    DVT Prophylaxis-   Lovenox   AM Labs Ordered, also please review Full Orders  Family Communication: Admission, patients condition and plan of care including tests being ordered have been discussed with the patient who indicate understanding and agree with the plan and Code Status.  Code Status: Full code  Admission status: Inpatient: Based on patients clinical presentation and evaluation of above clinical data, I have made determination that patient meets Inpatient criteria at this time.  Time spent in minutes : 60 minutes   Merica Prell S Leira Regino M.D

## 2019-03-24 NOTE — ED Provider Notes (Signed)
[facility] EMERGENCY DEPARTMENT Provider Note   CSN: [id number] Arrival date & time: 03/24/19  1312     History Chief Complaint  Patient presents with  . Abscess  . Weakness    RANULFO KALL is a 63 y.o. male.  HPI      MAXEN ROWLAND is a 63 y.o. male, with a history of stroke, CAD, cocaine abuse, presenting to the ED with pain to the left thigh with an area of swelling for at least the last 2 weeks.  States this pain has been getting worse.  Pain is sharp, moderate to severe, radiating toward the abdomen. Patient also notes some constipation with inability to have a bowel movement for at least the last week. Denies fever/chills, N/V/D, hematochezia/melena, chest pain, shortness of breath, urinary symptoms, testicular/scrotal pain/swelling, numbness, weakness, or any other complaints.    Past Medical History:  Diagnosis Date  . CAD (coronary artery disease)    a. 04/2008 Cath: LM nl, LAD 50p, 18m, LCX min irregs, RCA 50p/m, 40d, RPDA 60-70ost; b. 04/2008 CABG x 4: LIMA->LAD, VG->D2, VG->RPDA->RPL.  . Cocaine abuse ([place])    a. Quit early 2019.  . Ischemic cardiomyopathy    a. LV gram: EF 30-35%, apical/periapical AK.  . Tobacco abuse     Patient Active Problem List   Diagnosis Date Noted  . Slow transit constipation   . Inguinal mass   . Chronic combined systolic and diastolic CHF (congestive heart failure) ([place])   . Mass of left inguinal region   . Greater trochanteric bursitis of left hip   . Hyponatremia   . Acute blood loss anemia   . Chronic combined systolic (congestive) and diastolic (congestive) heart failure ([place])   . Essential hypertension   . Left leg pain   . 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 Left hemiparesis ([place]) 03/06/2019  . Acute cerebrovascular accident (CVA) ([place]) 03/04/2019  . Chest pain 10/16/2017  . HYPERLIPIDEMIA-MIXED 06/16/2008  . TOBACCO ABUSE 06/16/2008  . MARIJUANA ABUSE 06/16/2008  .  Cocaine abuse ([place]) 06/16/2008  . CAD, ARTERY BYPASS GRAFT 04/30/2008    Past Surgical History:  Procedure Laterality Date  . BYPASS GRAFT    . CARDIAC SURGERY         History reviewed. No pertinent family history.  Social History   Tobacco Use  . Smoking status: Current Every Day Smoker    Packs/day: 0.30    Types: Cigarettes  . Smokeless tobacco: Current User  . Tobacco comment: has smoked for 40+ yrs - at most 1ppd, currently 1ppwk.  Substance Use Topics  . Alcohol use: No    Comment: quite > 30 yrs ago.  . Drug use: No    Comment: prev used cocaine - quit early 2019.    Home Medications Prior to Admission medications   Medication Sig Start Date End Date Taking? Authorizing Provider  acetaminophen (TYLENOL) 325 MG tablet Take 2 tablets (650 mg total) by mouth every 4 (four) hours as needed for mild pain (or temp > 37.5 C (99.5 F)). 03/06/19   Pokhrel, Corrie Mckusick, MD  aspirin EC 81 MG EC tablet Take 1 tablet (81 mg total) by mouth daily. Patient not taking: Reported on 03/04/2019 10/18/17   Molli Hazard A, DO  atorvastatin (LIPITOR) 40 MG tablet Take 1 tablet (40 mg total) by mouth daily at 6 PM. 03/13/19 03/07/20  Angiulli, Lavon Paganini, PA-C  clopidogrel (PLAVIX) 75 MG tablet Take 1 tablet (75 mg  total) by mouth daily. 03/13/19   Angiulli, Lavon Paganini, PA-C  diclofenac Sodium (VOLTAREN) 1 % GEL Apply 2 g topically 4 (four) times daily. 03/13/19   Angiulli, Lavon Paganini, PA-C  gabapentin (NEURONTIN) 100 MG capsule Take 1 capsule (100 mg total) by mouth 3 (three) times daily. 03/13/19   Angiulli, Lavon Paganini, PA-C  lidocaine (LIDODERM) 5 % Place 1 patch onto the skin daily. Remove & Discard patch within 12 hours or as directed by MD 03/13/19   Angiulli, Lavon Paganini, PA-C  methocarbamol (ROBAXIN) 500 MG tablet Take 1 tablet (500 mg total) by mouth every 6 (six) hours as needed for muscle spasms. 03/13/19   Angiulli, Lavon Paganini, PA-C  Multiple Vitamin (MULTIVITAMIN WITH MINERALS) TABS tablet Take  1 tablet by mouth daily. 03/14/19   Angiulli, Lavon Paganini, PA-C  nicotine (NICODERM CQ - DOSED IN MG/24 HOURS) 21 mg/24hr patch 21 mg patch daily x2 weeks then 14 mg patch daily x3 weeks then 7 mg patch daily x3 weeks and stop 03/13/19   Angiulli, Lavon Paganini, PA-C    Allergies    Patient has no known allergies.  Review of Systems   Review of Systems  Constitutional: Negative for chills, diaphoresis and fever.  Respiratory: Negative for shortness of breath.   Cardiovascular: Negative for chest pain.  Gastrointestinal: Positive for abdominal pain and constipation. Negative for blood in stool, diarrhea, nausea and vomiting.  Genitourinary: Negative for discharge, dysuria, flank pain, frequency, hematuria, penile pain, penile swelling, scrotal swelling and testicular pain.  Musculoskeletal: Negative for back pain.       Swelling and pain in the left inguinal region.  Neurological: Negative for weakness and numbness.  All other systems reviewed and are negative.   Physical Exam Updated Vital Signs BP (!) 134/98 (BP Location: Right Arm)   Pulse 83   Temp 98.6 F (37 C) (Oral)   Resp 19   SpO2 97%   Physical Exam Vitals and nursing note reviewed.  Constitutional:      General: He is not in acute distress.    Appearance: He is well-developed. He is not diaphoretic.  HENT:     Head: Normocephalic and atraumatic.     Mouth/Throat:     Mouth: Mucous membranes are moist.     Pharynx: Oropharynx is clear.  Eyes:     Conjunctiva/sclera: Conjunctivae normal.  Cardiovascular:     Rate and Rhythm: Normal rate and regular rhythm.     Pulses: Normal pulses.          Radial pulses are 2+ on the right side and 2+ on the left side.       Posterior tibial pulses are 2+ on the right side and 2+ on the left side.     Heart sounds: Normal heart sounds.     Comments: Tactile temperature in the extremities appropriate and equal bilaterally. Pulmonary:     Effort: Pulmonary effort is normal. No  respiratory distress.     Breath sounds: Normal breath sounds.  Abdominal:     Palpations: Abdomen is soft.     Tenderness: There is abdominal tenderness in the left lower quadrant. There is no guarding.  Genitourinary:    Testes: Normal.       Comments: Two ping-pong size masses in the left inguinal and upper thigh region. No erythema or other color change noted.  No wounds noted. Genital Exam: Penis, scrotum, and testicles without swelling, lesions, or tenderness. No penile discharge.   No inguinal hernia  noted. Cremasteric reflex intact.  Otherwise normal male genitalia.  Musculoskeletal:     Cervical back: Neck supple.     Right lower leg: No edema.     Left lower leg: No edema.  Lymphadenopathy:     Cervical: No cervical adenopathy.  Skin:    General: Skin is warm and dry.  Neurological:     Mental Status: He is alert.  Psychiatric:        Mood and Affect: Mood and affect normal.        Speech: Speech normal.        Behavior: Behavior normal.     ED Results / Procedures / Treatments   Labs (all labs ordered are listed, but only abnormal results are displayed) Labs Reviewed  COMPREHENSIVE METABOLIC PANEL - Abnormal; Notable for the following components:      Result Value   Glucose, Bld 101 (*)    Albumin 2.8 (*)    All other components within normal limits  CBC WITH DIFFERENTIAL/PLATELET - Abnormal; Notable for the following components:   Platelets 452 (*)    Lymphs Abs 0.6 (*)    All other components within normal limits    EKG None  Radiology No results found.  Procedures Procedures (including critical care time)  Medications Ordered in ED Medications  morphine 2 MG/ML injection 2 mg (2 mg Intravenous Given 03/24/19 1452)  ondansetron (ZOFRAN) injection 4 mg (4 mg Intravenous Given 03/24/19 1452)    ED Course  I have reviewed the triage vital signs and the nursing notes.  Pertinent labs & imaging results that were available during my care of the  patient were reviewed by me and considered in my medical decision making (see chart for details).    MDM Rules/Calculators/A&P                      Patient presents with left upper thigh and inguinal swelling and pain along with abdominal tenderness on the exam today. On December 18, an ultrasound was performed of the left inguinal region which showed possible inflamed lymph node versus complex fluid collection.  No steps were taken to address this finding. Patient is nontoxic appearing, afebrile, not tachycardic, not tachypneic, not hypotensive, maintains excellent SPO2 on room air, and is in no apparent distress.   End of shift patient care handoff report given to Alecia Lemming, PA-C. Plan: Patient awaiting CT of the abdomen and upper thighs.   Vitals:   03/24/19 1324 03/24/19 1430 03/24/19 1500  BP: (!) 134/98 125/84 108/83  Pulse: 83 85 79  Resp: 19 19 14   Temp: 98.6 F (37 C)    TempSrc: Oral    SpO2: 97% 96% 94%     Final Clinical Impression(s) / ED Diagnoses Final diagnoses:  None    Rx / DC Orders ED Discharge Orders    None       Layla Maw 03/24/19 1610    Tegeler, Gwenyth Allegra, MD 03/24/19 [phone]

## 2019-03-25 ENCOUNTER — Encounter: Payer: Medicaid Other | Attending: Registered Nurse | Admitting: Registered Nurse

## 2019-03-25 ENCOUNTER — Inpatient Hospital Stay (HOSPITAL_COMMUNITY): Payer: Medicaid Other

## 2019-03-25 DIAGNOSIS — R590 Localized enlarged lymph nodes: Secondary | ICD-10-CM

## 2019-03-25 DIAGNOSIS — I2510 Atherosclerotic heart disease of native coronary artery without angina pectoris: Secondary | ICD-10-CM

## 2019-03-25 DIAGNOSIS — Z8673 Personal history of transient ischemic attack (TIA), and cerebral infarction without residual deficits: Secondary | ICD-10-CM

## 2019-03-25 LAB — COMPREHENSIVE METABOLIC PANEL
ALT: 12 U/L (ref 0–44)
AST: 25 U/L (ref 15–41)
Albumin: 2.6 g/dL — ABNORMAL LOW (ref 3.5–5.0)
Alkaline Phosphatase: 71 U/L (ref 38–126)
Anion gap: 13 (ref 5–15)
BUN: 12 mg/dL (ref 8–23)
CO2: 23 mmol/L (ref 22–32)
Calcium: 9.1 mg/dL (ref 8.9–10.3)
Chloride: 101 mmol/L (ref 98–111)
Creatinine, Ser: 0.83 mg/dL (ref 0.61–1.24)
GFR calc Af Amer: >60 mL/min (ref >60)
GFR calc non Af Amer: >60 mL/min (ref >60)
Glucose, Bld: 97 mg/dL (ref 70–99)
Potassium: 4.2 mmol/L (ref 3.5–5.1)
Sodium: 137 mmol/L (ref 135–145)
Total Bilirubin: 0.8 mg/dL (ref 0.3–1.2)
Total Protein: 7 g/dL (ref 6.5–8.1)

## 2019-03-25 LAB — CBC
HCT: 38.3 % — ABNORMAL LOW (ref 39.0–52.0)
Hemoglobin: 12.6 g/dL — ABNORMAL LOW (ref 13.0–17.0)
MCH: 29.9 pg (ref 26.0–34.0)
MCHC: 32.9 g/dL (ref 30.0–36.0)
MCV: 90.8 fL (ref 80.0–100.0)
Platelets: 398 10*3/uL (ref 150–400)
RBC: 4.22 MIL/uL (ref 4.22–5.81)
RDW: 14.4 % (ref 11.5–15.5)
WBC: 10.2 10*3/uL (ref 4.0–10.5)
nRBC: 0 % (ref 0.0–0.2)

## 2019-03-25 MED ORDER — IOHEXOL 300 MG/ML  SOLN
75.0000 mL | Freq: Once | INTRAMUSCULAR | Status: AC | PRN
  Administered 2019-03-25: 75 mL via INTRAVENOUS

## 2019-03-25 NOTE — Progress Notes (Signed)
PT REFUSING AM VITAL CHECK. RN NOTIFIED

## 2019-03-25 NOTE — H&P (Signed)
Chief Complaint: Left groin pain  Referring Physician(s): Dr, Georgiann Mohs  Supervising Physician: Dr. Rolla Plate  Patient Status: [facility] - In-pt  History of Present Illness: [name] is a 63 y.o. male history of polysubstance abuse, CABG with stent recently discharged to rehab on 12.18.20 r/t subacute CVA presented to this facility with left groin pain found to have lymphadenopathy. Team is requesting a biopsy of left inguinal lymph node for further determination.    Past Medical History:  Diagnosis Date  . CAD (coronary artery disease)    a. 04/2008 Cath: LM nl, LAD 50p, 53m, LCX min irregs, RCA 50p/m, 40d, RPDA 60-70ost; b. 04/2008 CABG x 4: LIMA->LAD, VG->D2, VG->RPDA->RPL.  . Cocaine abuse ([place])    a. Quit early 2019.  . Ischemic cardiomyopathy    a. LV gram: EF 30-35%, apical/periapical AK.  . Tobacco abuse     Past Surgical History:  Procedure Laterality Date  . BYPASS GRAFT    . CARDIAC SURGERY      Allergies: Patient has no known allergies.  Medications: Prior to Admission medications   Not on File     History reviewed. No pertinent family history.  Social History   Socioeconomic History  . Marital status: Single    Spouse name: Not on file  . Number of children: Not on file  . Years of education: Not on file  . Highest education level: Not on file  Occupational History  . Not on file  Tobacco Use  . Smoking status: Current Every Day Smoker    Packs/day: 0.30    Types: Cigarettes  . Smokeless tobacco: Current User  . Tobacco comment: has smoked for 40+ yrs - at most 1ppd, currently 1ppwk.  Substance and Sexual Activity  . Alcohol use: No    Comment: quite > 30 yrs ago.  . Drug use: No    Comment: prev used cocaine - quit early 2019.  Marland Kitchen Sexual activity: Not on file  Other Topics Concern  . Not on file  Social History Narrative   Lives in [place] by himself.  Currently staying in a half-way house designed to help those addicted to drugs get  clean, and find housing/employment.  He exercises some - walks/push ups.   Social Determinants of Health   Financial Resource Strain:   . Difficulty of Paying Living Expenses: Not on file  Food Insecurity:   . Worried About Charity fundraiser in the Last Year: Not on file  . Ran Out of Food in the Last Year: Not on file  Transportation Needs:   . Lack of Transportation (Medical): Not on file  . Lack of Transportation (Non-Medical): Not on file  Physical Activity:   . Days of Exercise per Week: Not on file  . Minutes of Exercise per Session: Not on file  Stress:   . Feeling of Stress : Not on file  Social Connections:   . Frequency of Communication with Friends and Family: Not on file  . Frequency of Social Gatherings with Friends and Family: Not on file  . Attends Religious Services: Not on file  . Active Member of Clubs or Organizations: Not on file  . Attends Archivist Meetings: Not on file  . Marital Status: Not on file     Review of Systems: A 12 point ROS discussed and pertinent positives are indicated in the HPI above.  All other systems are negative.  Review of Systems  Vital Signs: BP 113/77 (BP  Location: Left Arm)   Pulse 92   Temp 98.6 F (37 C) (Oral)   Resp 18   SpO2 96%   Physical Exam  Imaging: CT ANGIO HEAD W OR WO CONTRAST  Result Date: 03/05/2019 CLINICAL DATA:  Stroke follow-up EXAM: CT ANGIOGRAPHY HEAD AND NECK TECHNIQUE: Multidetector CT imaging of the head and neck was performed using the standard protocol during bolus administration of intravenous contrast. Multiplanar CT image reconstructions and MIPs were obtained to evaluate the vascular anatomy. Carotid stenosis measurements (when applicable) are obtained utilizing NASCET criteria, using the distal internal carotid diameter as the denominator. CONTRAST:  93mL OMNIPAQUE IOHEXOL 350 MG/ML SOLN COMPARISON:  None. FINDINGS: CTA NECK FINDINGS SKELETON: There is no bony spinal canal  stenosis. No lytic or blastic lesion. OTHER NECK: Normal pharynx, larynx and major salivary glands. No cervical lymphadenopathy. Unremarkable thyroid gland. UPPER CHEST: No pneumothorax or pleural effusion. No nodules or masses. AORTIC ARCH: There is no calcific atherosclerosis of the aortic arch. There is no aneurysm, dissection or hemodynamically significant stenosis of the visualized portion of the aorta. Conventional 3 vessel aortic branching pattern. The visualized proximal subclavian arteries are widely patent. RIGHT CAROTID SYSTEM: No dissection, occlusion or aneurysm. Mild atherosclerotic calcification at the carotid bifurcation without hemodynamically significant stenosis. LEFT CAROTID SYSTEM: Normal without aneurysm, dissection or stenosis. VERTEBRAL ARTERIES: Left dominant configuration. Both origins are clearly patent. There is no dissection, occlusion or flow-limiting stenosis to the skull base (V1-V3 segments). CTA HEAD FINDINGS POSTERIOR CIRCULATION: --Vertebral arteries: Normal V4 segments. --Posterior inferior cerebellar arteries (PICA): Patent origins from the vertebral arteries. --Anterior inferior cerebellar arteries (AICA): Patent origins from the basilar artery. --Basilar artery: Normal. --Superior cerebellar arteries: Normal. --Posterior cerebral arteries: Normal. Both originate from the basilar artery. Posterior communicating arteries (p-comm) are diminutive or absent. ANTERIOR CIRCULATION: --Intracranial internal carotid arteries: There are severe stenoses of the cavernous segments of both internal carotid arteries due to calcific atherosclerosis. --Anterior cerebral arteries (ACA): Normal. Both A1 segments are present. Patent anterior communicating artery (a-comm). --Middle cerebral arteries (MCA): There is occlusion of the mid M1 segment of the right middle cerebral artery. The M2 branches are patent. VENOUS SINUSES: As permitted by contrast timing, patent. ANATOMIC VARIANTS: None Review  of the MIP images confirms the above findings. IMPRESSION: 1. 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 2. 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 These results were communicated to Dr. Roland Rack at 12:31 am on 03/05/2019 by text page via the [facility] messaging system. Electronically Signed   By: Ulyses Jarred M.D.   On: 03/05/2019 00:33   DG Abd 1 View  Result Date: 03/14/2019 CLINICAL DATA:  Vomiting. EXAM: ABDOMEN - 1 VIEW COMPARISON:  None. FINDINGS: No abnormal bowel dilatation is noted. Large amount of stool seen throughout the colon. No radio-opaque calculi or other significant radiographic abnormality are seen. IMPRESSION: Large stool burden.  No abnormal bowel dilatation. Electronically Signed   By: Marijo Conception M.D.   On: 03/14/2019 11:51   CT ANGIO NECK W OR WO CONTRAST  Result Date: 03/05/2019 CLINICAL DATA:  Stroke follow-up EXAM: CT ANGIOGRAPHY HEAD AND NECK TECHNIQUE: Multidetector CT imaging of the head and neck was performed using the standard protocol during bolus administration of intravenous contrast. Multiplanar CT image reconstructions and MIPs were obtained to evaluate the vascular anatomy. Carotid stenosis measurements (when applicable) are obtained utilizing NASCET criteria, using the distal internal carotid  diameter as the denominator. CONTRAST:  104mL OMNIPAQUE IOHEXOL 350 MG/ML SOLN COMPARISON:  None. FINDINGS: CTA NECK FINDINGS SKELETON: There is no bony spinal canal stenosis. No lytic or blastic lesion. OTHER NECK: Normal pharynx, larynx and major salivary glands. No cervical lymphadenopathy. Unremarkable thyroid gland. UPPER CHEST: No pneumothorax or pleural effusion. No nodules or masses. AORTIC ARCH: There is no calcific atherosclerosis of the aortic arch. There is no aneurysm, dissection or hemodynamically significant stenosis of the visualized portion of the aorta.  Conventional 3 vessel aortic branching pattern. The visualized proximal subclavian arteries are widely patent. RIGHT CAROTID SYSTEM: No dissection, occlusion or aneurysm. Mild atherosclerotic calcification at the carotid bifurcation without hemodynamically significant stenosis. LEFT CAROTID SYSTEM: Normal without aneurysm, dissection or stenosis. VERTEBRAL ARTERIES: Left dominant configuration. Both origins are clearly patent. There is no dissection, occlusion or flow-limiting stenosis to the skull base (V1-V3 segments). CTA HEAD FINDINGS POSTERIOR CIRCULATION: --Vertebral arteries: Normal V4 segments. --Posterior inferior cerebellar arteries (PICA): Patent origins from the vertebral arteries. --Anterior inferior cerebellar arteries (AICA): Patent origins from the basilar artery. --Basilar artery: Normal. --Superior cerebellar arteries: Normal. --Posterior cerebral arteries: Normal. Both originate from the basilar artery. Posterior communicating arteries (p-comm) are diminutive or absent. ANTERIOR CIRCULATION: --Intracranial internal carotid arteries: There are severe stenoses of the cavernous segments of both internal carotid arteries due to calcific atherosclerosis. --Anterior cerebral arteries (ACA): Normal. Both A1 segments are present. Patent anterior communicating artery (a-comm). --Middle cerebral arteries (MCA): There is occlusion of the mid M1 segment of the right middle cerebral artery. The M2 branches are patent. VENOUS SINUSES: As permitted by contrast timing, patent. ANATOMIC VARIANTS: None Review of the MIP images confirms the above findings. IMPRESSION: 1. 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 2. 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 These results were communicated to Dr. Roland Rack at 12:31 am on 03/05/2019 by text page via the [facility] messaging system. Electronically Signed   By: Ulyses Jarred M.D.   On: 03/05/2019 00:33   CT CHEST W CONTRAST  Result Date: 03/25/2019 CLINICAL DATA:  Abnormal spleen and abdominal adenopathy on CT abdomen. Concern for bronchogenic carcinoma EXAM: CT CHEST WITH CONTRAST TECHNIQUE: Multidetector CT imaging of the chest was performed during intravenous contrast administration. CONTRAST:  77mL OMNIPAQUE IOHEXOL 300 MG/ML  SOLN COMPARISON:  CT abdomen 03/24/2019. FINDINGS: Cardiovascular: Post CABG anatomy. Mediastinum/Nodes: No axillary adenopathy. No mediastinal adenopathy. Bulky hilar adenopathy/mass on the RIGHT. Nodal masses measure 3.8 and 3.5 cm (image 92/3. Rounded peribronchial thickening in the RIGHT upper lobe measures 2.6 cm (image 85/3. There is collapse of the lateral segment of the RIGHT middle lobe. Small lingular nodule measuring 5 mm (image 76/4). Subpleural nodule LEFT upper lobe measuring 5 mm on image 67/4. Lungs/Pleura: Upper Abdomen: Massively enlarged spleen as described on comparison abdominal CT. Adrenal glands not well imaged. Musculoskeletal: No aggressive osseous lesion IMPRESSION: 1. Bulky RIGHT hilar adenopathy and central perihilar mass. Mass involves the RIGHT upper lobe and RIGHT middle lobe lobe. Atelectasis of the RIGHT middle lobe. Findings most suggestive bronchogenic carcinoma. 2. Massively enlarged spleen consistent with malignancy as described on comparison abdominal CT. 3. No axillary or supraclavicular adenopathy. 4. Small LEFT upper lobe nodules.  Recommend attention on follow-up. Electronically Signed   By: Suzy Bouchard M.D.   On: 03/25/2019 12:14   MR Brain Wo Contrast (neuro protocol)  Result Date: 03/04/2019 CLINICAL DATA:  Ataxia EXAM: MRI  HEAD WITHOUT CONTRAST TECHNIQUE: Multiplanar, multiecho pulse sequences of the brain and surrounding structures were obtained without intravenous contrast. COMPARISON:  None. FINDINGS: Brain: There is multifocal acute ischemia within the right hemisphere, scattered within the  right MCA territory with the largest lesions in the right frontal lobe and anterior right temporal lobe. No acute hemorrhage. No midline shift or other mass effect. Brain volume is normal. No hydrocephalus. There is mild cytotoxic edema associated with the ischemic sites but the brain parenchyma is otherwise normal. No chronic microhemorrhage. The midline structures are normal. Vascular: Flow voids are preserved. Skull and upper cervical spine: Normal bone marrow signal. Visualized extracranial soft tissues are normal. Sinuses/Orbits: Orbits are normal. No mastoid or middle ear effusion. Paranasal sinuses are clear. Other: None IMPRESSION: Multifocal acute ischemia within the right MCA territory. No hemorrhage or mass effect. Electronically Signed   By: Ulyses Jarred M.D.   On: 03/04/2019 19:11   CT ABDOMEN PELVIS W CONTRAST  Result Date: 03/24/2019 CLINICAL DATA:  Left lower quadrant abdominal pain. Painful left inguinal mass. EXAM: CT ABDOMEN AND PELVIS WITH CONTRAST TECHNIQUE: Multidetector CT imaging of the abdomen and pelvis was performed using the standard protocol following bolus administration of intravenous contrast. CONTRAST:  19mL OMNIPAQUE IOHEXOL 300 MG/ML  SOLN COMPARISON:  03/04/2019 chest radiograph. FINDINGS: Lower chest: Incompletely visualized large masslike focus of consolidation at the central right lung base involving right lower and right middle lobes measuring at least 8.0 x 6.1 cm (series 3/image 1). Separate 1.1 cm solid basilar right lower lobe pulmonary nodule (series 4/image 6). Coronary atherosclerosis. Hepatobiliary: Normal liver size. Subcentimeter hypodense segment 7 right liver lesion (series 3/image 20), too small to characterize. No additional liver lesions. Normal gallbladder with no radiopaque cholelithiasis. No biliary ductal dilatation. Pancreas: No pancreatic mass. Borderline prominent main pancreatic duct (3 mm diameter). Spleen: Enlarged markedly abnormal spleen (15  cm craniocaudal length) with markedly heterogeneous soft tissue density. Adrenals/Urinary Tract: No discrete adrenal nodules. Normal kidneys with no hydronephrosis and no renal mass. Normal bladder. Stomach/Bowel: Normal non-distended stomach. Normal caliber small bowel with no small bowel wall thickening. Appendix not discretely visualized. No pericecal inflammatory changes. Normal large bowel with no diverticulosis, large bowel wall thickening or pericolonic fat stranding. Vascular/Lymphatic: Normal caliber abdominal aorta. Patent portal, splenic, hepatic and renal veins. Enlarged centrally necrotic left inguinal lymph nodes, largest 2.6 cm short axis diameter (series 3/image 71). Enlarged centrally necrotic 2.5 cm left external iliac lymph node (series 3/image 63). Enlarged centrally necrotic 2.3 cm left common iliac node (series 3/image 56). Enlarged centrally necrotic left para-aortic lymph nodes up to 2.9 cm short axis diameter (series 3/image 35). Reproductive: Top-normal size prostate. Other: No pneumoperitoneum. No focal fluid collections. Small volume pelvic free fluid. Musculoskeletal: No aggressive appearing focal osseous lesions. Mild-to-moderate thoracolumbar spondylosis. IMPRESSION: 1. Incompletely imaged large 8 cm masslike focus of consolidation in the central right lung, cannot exclude primary bronchogenic carcinoma. Separate 1.1 cm right lung base solid pulmonary nodule. Dedicated chest CT with IV contrast recommended for further characterization. 2. Markedly abnormal enlarged spleen with heterogeneous soft tissue attenuation, nonspecific, suspicious for metastatic disease. 3. Centrally necrotic lymphadenopathy in the left para-aortic, left common iliac, left external iliac and left inguinal chains, suspicious for metastatic disease. The left inguinal adenopathy should be amenable to percutaneous biopsy. 4. Subcentimeter low-attenuation segment 7 right liver lesion, too small to characterize, for  which follow-up imaging is advised. 5. Small volume pelvic free fluid. Electronically Signed   By: Rinaldo Ratel  Poff M.D.   On: 03/24/2019 17:20   CT CEREBRAL PERFUSION W CONTRAST  Result Date: 03/05/2019 CLINICAL DATA:  Stroke follow-up. History of cocaine abuse. EXAM: CT PERFUSION BRAIN TECHNIQUE: Multiphase CT imaging of the brain was performed following IV bolus contrast injection. Subsequent parametric perfusion maps were calculated using RAPID software. CONTRAST:  32mL OMNIPAQUE IOHEXOL 350 MG/ML SOLN COMPARISON:  None. FINDINGS: CT Brain Perfusion Findings: CBF (<30%) Volume: 55mL Perfusion (Tmax>6.0s) volume: 8mL. T-max is elevated throughout much of the right MCA territory. Using a Tmax threshold of 4 seconds instead of 6 seconds demonstrates a larger region of ischemia measuring 100 mL. Mismatch Volume: 54mL Infarct Core: 0 mL Infarction Location: CT perfusion parameters do not demonstrated core infarct. However, earlier brain MRI shows abnormal diffusion restriction at multiple locations in the right MCA territory. No recent non-contrast head CT is available for determining ASPECTS. IMPRESSION: 1. No core infarct identified by CT perfusion parameters. However, earlier brain MRI shows abnormal diffusion restriction at multiple locations in the right MCA territory. 2. 7 mL region within the right frontal operculum meeting parameters for ischemic penumbra. Using a Tmax threshold of 4 seconds instead of 6 seconds demonstrates a larger region of ischemia measuring 100 mL. Electronically Signed   By: Ulyses Jarred M.D.   On: 03/05/2019 01:47   DG Chest Port 1 View  Result Date: 03/04/2019 CLINICAL DATA:  Weakness, fall. EXAM: PORTABLE CHEST 1 VIEW COMPARISON:  April 18, 2017. FINDINGS: The heart size and mediastinal contours are within normal limits. Status post coronary bypass graft. No pneumothorax or pleural effusion is noted. Left lung is clear. Large rounded density is noted in right lower lobe  concerning for possible neoplasm. The visualized skeletal structures are unremarkable. IMPRESSION: Large rounded density seen in right lower lobe concerning for possible neoplasm. CT scan of the chest is recommended for further evaluation. Electronically Signed   By: Marijo Conception M.D.   On: 03/04/2019 15:49   ECHOCARDIOGRAM COMPLETE  Result Date: 03/05/2019   ECHOCARDIOGRAM REPORT   Patient Name:   [name] Date of Exam: 03/05/2019 Medical Rec #:  [id number]       Height:       67.0 in Accession #:    [id number]      Weight:       119.1 lb Date of Birth:  1955/06/18       BSA:          1.62 m Patient Age:    2 years        BP:           106/72 mmHg Patient Gender: M               HR:           75 bpm. Exam Location:  Inpatient Procedure: 2D Echo Indications:    Stroke 434.91 / I163.9  History:        Patient has prior history of Echocardiogram examinations, most                 recent 10/17/2017. CAD; Risk Factors:Current Smoker and                 Dyslipidemia. Polysubstance Abuse.  Sonographer:    Leavy Cella Referring Phys: [place] MOHAMMAD L GARBA IMPRESSIONS  1. EF 40-45% with global hypokinesis. There is akinesis of the apical septum, apical inferior, and apical segments. There is no obvious thrombus present in the LV, but would recommend  a repeat contrast study to exclude apical thrombus in the patient with concerns for stroke.  2. Left ventricular ejection fraction, by visual estimation, is 40 to 45%. The left ventricle has mildly decreased function. There is no left ventricular hypertrophy.  3. Apical septal segment, apical inferior segment, and apex are abnormal.  4. The left ventricle demonstrates regional wall motion abnormalities.  5. Global right ventricle has normal systolic function.The right ventricular size is normal. No increase in right ventricular wall thickness.  6. Left atrial size was normal.  7. Right atrial size was normal.  8. The mitral valve is myxomatous. Trivial mitral  valve regurgitation.  9. The tricuspid valve is grossly normal. Tricuspid valve regurgitation is trivial. 10. The aortic valve is tricuspid. Aortic valve regurgitation is not visualized. No evidence of aortic valve sclerosis or stenosis. 11. The pulmonic valve was grossly normal. Pulmonic valve regurgitation is not visualized. 12. TR signal is inadequate for assessing pulmonary artery systolic pressure. 13. The inferior vena cava is normal in size with greater than 50% respiratory variability, suggesting right atrial pressure of 3 mmHg. 14. A prior study was performed on 10/17/2017. 15. Changes from prior study are noted. 16. EF slightly improved ~45% compared with prior. WMA remain unchanged. FINDINGS  Left Ventricle: Left ventricular ejection fraction, by visual estimation, is 40 to 45%. The left ventricle has mildly decreased function. The left ventricle demonstrates regional wall motion abnormalities. The left ventricular internal cavity size was the left ventricle is normal in size. There is no left ventricular hypertrophy. EF 40-45% with global hypokinesis. There is akinesis of the apical septum, apical inferior, and apical segments. There is no obvious thrombus present in the LV, but would recommend a repeat contrast study to exclude apical thrombus in the patient with concerns for stroke.  LV Wall Scoring: The apical septal segment, apical inferior segment, and apex are akinetic. Right Ventricle: The right ventricular size is normal. No increase in right ventricular wall thickness. Global RV systolic function is has normal systolic function. Left Atrium: Left atrial size was normal in size. Right Atrium: Right atrial size was normal in size Pericardium: There is no evidence of pericardial effusion. 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 Tricuspid Valve: The tricuspid valve is grossly normal. Tricuspid valve regurgitation is trivial. Aortic Valve: The aortic valve is  tricuspid. Aortic valve regurgitation is not visualized. The aortic valve is structurally normal, with no evidence of sclerosis or stenosis. Pulmonic Valve: The pulmonic valve was grossly normal. Pulmonic valve regurgitation is not visualized. Pulmonic regurgitation is not visualized. Aorta: The aortic root is normal in size and structure. Venous: The inferior vena cava is normal in size with greater than 50% respiratory variability, suggesting right atrial pressure of 3 mmHg. IAS/Shunts: No atrial level shunt detected by color flow Doppler. Additional Comments: A prior study was performed on 10/17/2017.  LEFT VENTRICLE PLAX 2D LVIDd:         4.50 cm  Diastology LVIDs:         3.40 cm  LV e' lateral:   8.95 cm/s LV PW:         1.20 cm  LV E/e' lateral: 6.4 LV IVS:        1.10 cm  LV e' medial:    7.09 cm/s LVOT diam:     2.20 cm  LV E/e' medial:  8.0 LV SV:         45 ml LV SV Index:  28.32 LVOT Area:     3.80 cm  RIGHT VENTRICLE RV S prime:     14.40 cm/s TAPSE (M-mode): 1.5 cm LEFT ATRIUM           Index       RIGHT ATRIUM           Index LA diam:      3.20 cm 1.97 cm/m  RA Area:     11.40 cm LA Vol (A2C): 31.0 ml 19.11 ml/m RA Volume:   24.90 ml  15.35 ml/m LA Vol (A4C): 44.3 ml 27.30 ml/m   AORTA Ao Root diam: 3.50 cm MITRAL VALVE MV Area (PHT): 5.42 cm             SHUNTS MV PHT:        40.60 msec           Systemic Diam: 2.20 cm MV Decel Time: 140 msec MV E velocity: 57.00 cm/s 103 cm/s MV A velocity: 91.70 cm/s 70.3 cm/s MV E/A ratio:  0.62       1.5  Eleonore Chiquito MD Electronically signed by Eleonore Chiquito MD Signature Date/Time: 03/05/2019/2:53:46 PM    Final    [place] LT LOWER EXTREM LTD SOFT TISSUE NON VASCULAR  Result Date: 03/14/2019 CLINICAL DATA:  Palpable lump over the left inguinal region. EXAM: ULTRASOUND LEFT LOWER EXTREMITY LIMITED TECHNIQUE: Ultrasound examination of the lower extremity soft tissues was performed in the area of clinical concern. COMPARISON:  None. FINDINGS: Joint Space: Not  evaluated. Muscles: Normal. Tendons: Not evaluated. Other Soft Tissue Structures: Ultrasound examination over patient's palpable abnormality in the left inguinal region demonstrates an oval homogeneously hypoechoic mass just below the skin measuring 1.4 x 2.2 x 2.4 cm. This has increased through transmission. There is a normal adjacent 9 mm lymph node. There is no communication with the underlying femoral artery and vein. This likely represents a abnormal inguinal lymph node although a complex fluid collection such as subcutaneous abscess or hematoma could have a similar appearance. There is internal vascularity favoring a solid mass such as abnormal lymph node. IMPRESSION: Oval hypoechoic mass just below the skin over the left inguinal region corresponding to patient's palpable abnormality. This could represent an abnormal lymph node versus complex fluid collection such as hematoma or subcutaneous abscess. Recommend clinical correlation. Electronically Signed   By: Marin Olp M.D.   On: 03/14/2019 12:46   DG Hip Unilat With Pelvis 2-3 Views Left  Result Date: 03/04/2019 CLINICAL DATA:  [place] today with left hip pain. EXAM: DG HIP (WITH OR WITHOUT PELVIS) 2-3V LEFT COMPARISON:  None. FINDINGS: No evidence of acute pelvic fracture. No evidence of left hip fracture. Old healed right femoral neck fracture IMPRESSION: No acute or traumatic finding. Electronically Signed   By: Nelson Chimes M.D.   On: 03/04/2019 13:34   ECHOCARDIOGRAM LIMITED  Result Date: 03/06/2019   ECHOCARDIOGRAM LIMITED REPORT   Patient Name:   [name] Date of Exam: 03/06/2019 Medical Rec #:  [id number]       Height:       67.0 in Accession #:    [id number]      Weight:       119.1 lb Date of Birth:  [birth year]       BSA:          1.62 m Patient Age:    52 years        BP:           111/79  mmHg Patient Gender: M               HR:           74 bpm. Exam Location:  Inpatient  Procedure: Limited Echo and Intracardiac Opacification  Agent Indications:    Stroke 434.91 / I163.9  History:        Patient has prior history of Echocardiogram examinations, most                 recent 03/05/2019. Cocaine abuse.  Sonographer:    Tiffany Dance Referring Phys: [id number] [place]  1. Left ventricular ejection fraction, by visual estimation, is 35 to 40%. The left ventricle has moderately decreased function. There is no left ventricular hypertrophy.  2. The left ventricle demonstrates regional wall motion abnormalities.  3. Global right ventricle has normal systolic function.The right ventricular size is normal.  4. Left atrial size was not assessed.  5. Right atrial size was normal.  6. Moderate pleural effusion.  7. The mitral valve was not assessed.  8. The tricuspid valve is not assessed.  9. The aortic valve was not assessed. 10. The pulmonic valve was not assessed. 11. Aortic root could not be assessed. 12. Limited study; no doppler performed; apical akinesis with moderate LV dysfunction; using definity, no obvious thrombus noted. FINDINGS  Left Ventricle: Left ventricular ejection fraction, by visual estimation, is 35 to 40%. The left ventricle has moderately decreased function. The left ventricle demonstrates regional wall motion abnormalities. Right Ventricle: The right ventricular size is normal. Global RV systolic function is has normal systolic function. Left Atrium: Left atrial size was not assessed. Right Atrium: Right atrial size was normal in size Pericardium: There is no evidence of pericardial effusion. There is a moderate pleural effusion in either the left or right lateral region. Mitral Valve: The mitral valve was not assessed. Tricuspid Valve: The tricuspid valve is not assessed. Aortic Valve: The aortic valve was not assessed. Pulmonic Valve: The pulmonic valve was not assessed. Aorta: Aortic root could not be assessed.  Additional Comments: Limited study; no doppler performed; apical akinesis with moderate LV  dysfunction; using definity, no obvious thrombus noted.  Kirk Ruths MD Electronically signed by Kirk Ruths MD Signature Date/Time: 03/06/2019/3:22:00 PM    Final     Labs:  CBC: Recent Labs    03/06/19 0425 03/07/19 0607 03/24/19 1420 03/25/19 0536  WBC 6.2 6.8 8.5 10.2  HGB 10.4* 11.5* 13.0 12.6*  HCT 31.3* 34.4* 39.7 38.3*  PLT 421* 451* 452* 398    COAGS: Recent Labs    03/04/19 1531  INR 1.1  APTT 34    BMP: Recent Labs    03/06/19 0425 03/07/19 0607 03/13/19 0811 03/24/19 1420 03/25/19 0536  NA 136 133*  --  136 137  K 4.0 4.0  --  4.0 4.2  CL 105 101  --  101 101  CO2 22 23  --  24 23  GLUCOSE 81 83  --  101* 97  BUN 12 10  --  11 12  CALCIUM 8.4* 8.8*  --  9.2 9.1  CREATININE 0.82 0.93 0.84 0.85 0.83  GFRNONAA >60 >60 >60 >60 >60  GFRAA >60 >60 >60 >60 >60    LIVER FUNCTION TESTS: Recent Labs    03/05/19 0443 03/07/19 0607 03/24/19 1420 03/25/19 0536  BILITOT 0.6 0.7 0.6 0.8  AST 11* 14* 26 25  ALT 8 9 15 12   ALKPHOS 54 58 63 71  PROT 6.3* 6.7 7.4 7.0  ALBUMIN 2.4* 2.5* 2.8* 2.6*    TUMOR MARKERS: No results for input(s): AFPTM, CEA, CA199, CHROMGRNA in the last 8760 hours.  Assessment and Plan:  63 y.o. male inpatient history of polysubstance abuse, CABG with stent recently discharged to rehab on 12.18.20 r/t subattue CVA presented to this facilitywith left groin pain found to have lymphadenopathy. Team is requesting a biopsy of left inguinal lymph node for further determination.   Pertinent Imaging 12.28.20 -CT abd pelvis reads Centrally necrotic lymphadenopathy in the left para-aortic, left common iliac, left external iliac and left inguinal chains, suspicious for metastatic disease. The left inguinal adenopathy should be amenable to percutaneous biopsy  All labs are within acceptable parameters. Plavix and ASA currently on hold  Patient tentatively scheduled for procedure for 12.29.20. Team instructed to: Keep Patient to  be NPO after midnight   IR will call patient when ready.  Risks and benefits of inguinal lymph node biopsy was discussed with the patient and/or patient's family including, but not limited to bleeding, infection, damage to adjacent structures or low yield requiring additional tests.  All of the questions were answered and there is agreement to proceed.  Consent signed and  Placed in IR Control room  Thank you for this interesting consult.  I greatly enjoyed meeting AMIT LEECE and look forward to participating in their care.  A copy of this report was sent to the requesting provider on this date.  Electronically Signed: Avel Peace, NP 03/25/2019, 4:35 PM   I spent a total of 40 Minutes    in face to face in clinical consultation, greater than 50% of which was counseling/coordinating care for left inguinal lymph node biopsy

## 2019-03-25 NOTE — Plan of Care (Signed)
  Problem: Nutrition: Goal: Adequate nutrition will be maintained Outcome: Not Progressing   

## 2019-03-25 NOTE — Progress Notes (Signed)
PROGRESS NOTE    [name]  KGU:[id number] DOB: 1955/11/05 DOA: 03/24/2019 PCP: Medicine, [facility]   Brief Narrative:  HPI on 03/24/2019 by Dr. Eleonore Chiquito [name]  is a 63 y.o. male, with history of polysubstance abuse including cocaine, CAD with stenting as well as CABG in 2010 on aspirin, ischemic cardiomyopathy EF 35%, tobacco abuse, hypertension who was recently discharged to rehab for acute to subacute CVA.  Neurology saw the patient and started him on aspirin Plavix for 3 weeks followed by aspirin alone.  Patient was discharged from the rehab on 03/14/2019. Today patient came to the ED as he developed pain in the left groin masses.  Patient says that he has had masses in the left groin for a while and became painful.  Denies fever or chills.  No nausea vomiting or diarrhea.  Denies chest pain or shortness of breath.  Denies dysuria.  He still has some weakness in left upper extremity from previous stroke. In the ED, CT of the abdomen pelvis showed incompletely imaged large 8 cm masslike focus of consolidation in the central right lung cannot exclude primary bronchogenic carcinoma.  Also separate 1.1 cm right lung base solid pulmonary nodule.  CT chest with IV contrast recommended.  Also showed markedly abnormal enlarged spleen with regional soft tissue attenuation suspicious for metastatic disease.  Central necrotic lymphadenopathy in the left para-aortic left, the like left external leg and left inguinal chains.  Suspicious for metastatic disease.  Left inguinal adenopathy should be amenable to percutaneous biopsy.  Subcentimeter low-attenuation segment 7 right liver lesion.  Interim history  Patient admitted for lung mass noted on CT abdomen and pelvis as well as left inguinal adenopathy.  Patient currently pending interventional radiology for biopsy. Assessment & Plan   Lung mass -Noted on CT abdomen and pelvis: Large 8 cm masslike focus of consolidation in  the central right lung.  Several 1.1 cm right lung base solid pulmonary nodule. -Pending CT chest with IV contrast.  Left inguinal adenopathy -Suspect malignant -CT abdomen pelvis showed abnormal enlarged spleen with heterogeneous soft tissue attenuation, nonspecific, suspicious for metastatic disease.  Centrally necrotic lymphadenopathy in the left para-aortic, left common iliac, left external iliac and left inguinal chain suspicious for metastatic disease. -Interventional radiology consulted and appreciated, pending lymph node biopsy -Pending those results, will likely need hematology oncology consult  History of CVA -Was supposed to be on aspirin and Plavix however I called [place] and these prescriptions were not picked up mid December.  Patient does not even recall or know what Plavix is.  History of CAD -Status post CABG -Currently chest pain-free -As above, patient does not know what Plavix is, nor has he been on any of his home medications for several weeks  DVT Prophylaxis SCDs  Code Status: Full  Family Communication: None at bedside  Disposition Plan: Admitted.  Pending lymph node biopsy  Consultants IR  Procedures  None  Antibiotics   Anti-infectives (From admission, onward)   None      Subjective:   [name] seen and examined today.  Patient with no complaints today.  Not very interactive this morning.  Denies chest pain or shortness of breath, abdominal pain, dizziness or headache.  Does state that the left inguinal area does hurt.    Objective:   Vitals:   03/24/19 1630 03/24/19 1830 03/24/19 1900 03/25/19 0435  BP:  (!) 127/93 113/81 113/77  Pulse: 82 89 88 92  Resp: 15  17 18 18   Temp:    98.6 F (37 C)  TempSrc:    Oral  SpO2: 94% 95% 95% 96%    Intake/Output Summary (Last 24 hours) at 03/25/2019 1045 Last data filed at 03/25/2019 1020 Gross per 24 hour  Intake 942.5 ml  Output 1150 ml  Net -207.5 ml   There were no vitals  filed for this visit.  Exam  General: Well developed, cachectic, thin, NAD  HEENT: NCAT, mucous membranes moist.   Cardiovascular: S1 S2 auscultated, RRR  Respiratory: Clear to auscultation bilaterally   Abdomen: Soft, nontender, nondistended, + bowel sounds  Extremities: warm dry without cyanosis clubbing or edema.  Left inguinal area with palpation, noted firmness  Neuro: AAOx3, nonfocal  Psych: Flat   Data Reviewed: I have personally reviewed following labs and imaging studies  CBC: Recent Labs  Lab 03/24/19 1420 03/25/19 0536  WBC 8.5 10.2  NEUTROABS 7.2  --   HGB 13.0 12.6*  HCT 39.7 38.3*  MCV 91.1 90.8  PLT 452* 419   Basic Metabolic Panel: Recent Labs  Lab 03/24/19 1420 03/25/19 0536  NA 136 137  K 4.0 4.2  CL 101 101  CO2 24 23  GLUCOSE 101* 97  BUN 11 12  CREATININE 0.85 0.83  CALCIUM 9.2 9.1   GFR: Estimated Creatinine Clearance: 64.9 mL/min (by C-G formula based on SCr of 0.83 mg/dL). Liver Function Tests: Recent Labs  Lab 03/24/19 1420 03/25/19 0536  AST 26 25  ALT 15 12  ALKPHOS 63 71  BILITOT 0.6 0.8  PROT 7.4 7.0  ALBUMIN 2.8* 2.6*   No results for input(s): LIPASE, AMYLASE in the last 168 hours. No results for input(s): AMMONIA in the last 168 hours. Coagulation Profile: No results for input(s): INR, PROTIME in the last 168 hours. Cardiac Enzymes: No results for input(s): CKTOTAL, CKMB, CKMBINDEX, TROPONINI in the last 168 hours. BNP (last 3 results) No results for input(s): PROBNP in the last 8760 hours. HbA1C: No results for input(s): HGBA1C in the last 72 hours. CBG: No results for input(s): GLUCAP in the last 168 hours. Lipid Profile: No results for input(s): CHOL, HDL, LDLCALC, TRIG, CHOLHDL, LDLDIRECT in the last 72 hours. Thyroid Function Tests: No results for input(s): TSH, T4TOTAL, FREET4, T3FREE, THYROIDAB in the last 72 hours. Anemia Panel: No results for input(s): VITAMINB12, FOLATE, FERRITIN, TIBC, IRON,  RETICCTPCT in the last 72 hours. Urine analysis:    Component Value Date/Time   COLORURINE YELLOW 03/04/2019 [place] 03/04/2019 1658   LABSPEC 1.027 03/04/2019 1658   PHURINE 6.0 03/04/2019 1658   GLUCOSEU NEGATIVE 03/04/2019 1658   HGBUR NEGATIVE 03/04/2019 1658   BILIRUBINUR NEGATIVE 03/04/2019 1658   KETONESUR NEGATIVE 03/04/2019 1658   PROTEINUR 30 (A) 03/04/2019 1658   UROBILINOGEN 1.0 04/30/2008 0423   NITRITE NEGATIVE 03/04/2019 1658   LEUKOCYTESUR NEGATIVE 03/04/2019 1658   Sepsis Labs: @LABRCNTIP (procalcitonin:4,lacticidven:4)  ) Recent Results (from the past 240 hour(s))  SARS CORONAVIRUS 2 (TAT 6-24 HRS) Nasopharyngeal Nasopharyngeal Swab     Status: None   Collection Time: 03/24/19  5:40 PM   Specimen: Nasopharyngeal Swab  Result Value Ref Range Status   SARS Coronavirus 2 NEGATIVE NEGATIVE Final    Comment: (NOTE) SARS-CoV-2 target nucleic acids are NOT DETECTED. The SARS-CoV-2 RNA is generally detectable in upper and lower respiratory specimens during the acute phase of infection. Negative results do not preclude SARS-CoV-2 infection, do not rule out co-infections with other pathogens, and should not  be used as the sole basis for treatment or other patient management decisions. Negative results must be combined with clinical observations, patient history, and epidemiological information. The expected result is Negative. Fact Sheet for Patients: SugarRoll.be Fact Sheet for Healthcare Providers: https://www.woods-mathews.com/ This test is not yet approved or cleared by the [place] FDA and  has been authorized for detection and/or diagnosis of SARS-CoV-2 by FDA under an Emergency Use Authorization (EUA). This EUA will remain  in effect (meaning this test can be used) for the duration of the COVID-19 declaration under Section 56 4(b)(1) of the Act, 21 U.S.C. section 360bbb-3(b)(1), unless the  authorization is terminated or revoked sooner. Performed at [facility], [place] [address] [place], [place] [address]       Radiology Studies: CT ABDOMEN PELVIS W CONTRAST  Result Date: 03/24/2019 CLINICAL DATA:  Left lower quadrant abdominal pain. Painful left inguinal mass. EXAM: CT ABDOMEN AND PELVIS WITH CONTRAST TECHNIQUE: Multidetector CT imaging of the abdomen and pelvis was performed using the standard protocol following bolus administration of intravenous contrast. CONTRAST:  11mL OMNIPAQUE IOHEXOL 300 MG/ML  SOLN COMPARISON:  03/04/2019 chest radiograph. FINDINGS: Lower chest: Incompletely visualized large masslike focus of consolidation at the central right lung base involving right lower and right middle lobes measuring at least 8.0 x 6.1 cm (series 3/image 1). Separate 1.1 cm solid basilar right lower lobe pulmonary nodule (series 4/image 6). Coronary atherosclerosis. Hepatobiliary: Normal liver size. Subcentimeter hypodense segment 7 right liver lesion (series 3/image 20), too small to characterize. No additional liver lesions. Normal gallbladder with no radiopaque cholelithiasis. No biliary ductal dilatation. Pancreas: No pancreatic mass. Borderline prominent main pancreatic duct (3 mm diameter). Spleen: Enlarged markedly abnormal spleen (15 cm craniocaudal length) with markedly heterogeneous soft tissue density. Adrenals/Urinary Tract: No discrete adrenal nodules. Normal kidneys with no hydronephrosis and no renal mass. Normal bladder. Stomach/Bowel: Normal non-distended stomach. Normal caliber small bowel with no small bowel wall thickening. Appendix not discretely visualized. No pericecal inflammatory changes. Normal large bowel with no diverticulosis, large bowel wall thickening or pericolonic fat stranding. Vascular/Lymphatic: Normal caliber abdominal aorta. Patent portal, splenic, hepatic and renal veins. Enlarged centrally necrotic left inguinal lymph nodes, largest 2.6 cm  short axis diameter (series 3/image 71). Enlarged centrally necrotic 2.5 cm left external iliac lymph node (series 3/image 63). Enlarged centrally necrotic 2.3 cm left common iliac node (series 3/image 56). Enlarged centrally necrotic left para-aortic lymph nodes up to 2.9 cm short axis diameter (series 3/image 35). Reproductive: Top-normal size prostate. Other: No pneumoperitoneum. No focal fluid collections. Small volume pelvic free fluid. Musculoskeletal: No aggressive appearing focal osseous lesions. Mild-to-moderate thoracolumbar spondylosis. IMPRESSION: 1. Incompletely imaged large 8 cm masslike focus of consolidation in the central right lung, cannot exclude primary bronchogenic carcinoma. Separate 1.1 cm right lung base solid pulmonary nodule. Dedicated chest CT with IV contrast recommended for further characterization. 2. Markedly abnormal enlarged spleen with heterogeneous soft tissue attenuation, nonspecific, suspicious for metastatic disease. 3. Centrally necrotic lymphadenopathy in the left para-aortic, left common iliac, left external iliac and left inguinal chains, suspicious for metastatic disease. The left inguinal adenopathy should be amenable to percutaneous biopsy. 4. Subcentimeter low-attenuation segment 7 right liver lesion, too small to characterize, for which follow-up imaging is advised. 5. Small volume pelvic free fluid. Electronically Signed   By: Ilona Sorrel M.D.   On: 03/24/2019 17:20     Scheduled Meds: . atorvastatin  40 mg Oral q1800  . gabapentin  100 mg Oral  TID  . multivitamin with minerals  1 tablet Oral Daily   Continuous Infusions: . sodium chloride 75 mL/hr at 03/25/19 0555     LOS: 1 day   Time Spent in minutes   45 minutes  Missey Hasley D.O. on 03/25/2019 at 10:45 AM  Between 7am to 7pm - Please see pager noted on [email or web address]  After 7pm go to [email or web address]  And look for the night coverage person covering for me after hours  [facility] Office  [phone]

## 2019-03-26 ENCOUNTER — Inpatient Hospital Stay (HOSPITAL_COMMUNITY): Payer: Medicaid Other

## 2019-03-26 ENCOUNTER — Other Ambulatory Visit: Payer: Self-pay | Admitting: Oncology

## 2019-03-26 DIAGNOSIS — R918 Other nonspecific abnormal finding of lung field: Secondary | ICD-10-CM

## 2019-03-26 DIAGNOSIS — R1909 Other intra-abdominal and pelvic swelling, mass and lump: Secondary | ICD-10-CM

## 2019-03-26 MED ORDER — LIDOCAINE HCL (PF) 1 % IJ SOLN
INTRAMUSCULAR | Status: AC
  Filled 2019-03-26: qty 30

## 2019-03-26 MED ORDER — MIDAZOLAM HCL 2 MG/2ML IJ SOLN
INTRAMUSCULAR | Status: AC
  Filled 2019-03-26: qty 2

## 2019-03-26 MED ORDER — MIDAZOLAM HCL 2 MG/2ML IJ SOLN
INTRAMUSCULAR | Status: AC | PRN
  Administered 2019-03-26: 0.5 mg via INTRAVENOUS
  Administered 2019-03-26: 1 mg via INTRAVENOUS

## 2019-03-26 MED ORDER — FENTANYL CITRATE (PF) 100 MCG/2ML IJ SOLN
INTRAMUSCULAR | Status: AC | PRN
  Administered 2019-03-26: 50 ug via INTRAVENOUS

## 2019-03-26 MED ORDER — FENTANYL CITRATE (PF) 100 MCG/2ML IJ SOLN
INTRAMUSCULAR | Status: AC
  Filled 2019-03-26: qty 2

## 2019-03-26 NOTE — Progress Notes (Signed)
Patient ambulated with a walker from his room [phone]) to [facility] nurses station. Patient stated he doesn't use a walker at home but would benefit from using one while ambulating. Patient stated he does feel weak when walking. Will continue to monitor patient.   Farley Ly RN

## 2019-03-26 NOTE — Progress Notes (Signed)
Patient is refusing to ambulate at this time. MD made aware. Will ask patient to ambulate later on throughout the shift.   Farley Ly RN

## 2019-03-26 NOTE — Progress Notes (Signed)
Hospitalist daily note   [name] [id number] DOB: 03-06-1956 DOA: 03/24/2019  PCP: Medicine, [facility]   Narrative:  63 year old male recent admission discharge 03/04/2019 went to [facility], prior cocaine abuse, tobacco abuse, CAD status post bypass surgery 04/29/2008--possible stroke at the time of CABG EF 30-35% in the past Recent discharge from [facility] 12/18 Came to emergency room with left groin pain-imaging showed 8 cm focus consolidation central right lung, 1.1 cm right lung base solid tumor  Data Reviewed:  BUN/creatinine 12/0.8 WBC 10.2, hemoglobin 12.6 Inguinal node biopsy performed 03/25/2019 Ct chest 12/29 showed bulky right hilar adenopathy central perihilar mass involving right upper right middle lobe and atelectasis right upper lobe, massively enlarged low Assessment & Plan: 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 CT of chest shows bulky right hilar adenopathy and mass Biopsies were performed on 12/30 and will not be back for several days-patient lives alone but is aware that he will probably be discharged tomorrow for outpatient follow-up I have discussed with oncology regarding plan of care and disposition and they will set up a close follow-up to discuss pathology once it gets back CAD status post bypass surgery 04/29/2008 Not on prior to admission medications may require low-dose beta-blocker or 8 Continue tobacco cocaine Urged cessation    Subjective: Feeling sore from procedure otherwise fine Consultants:   IR  Telephone consulted Ms. Curcio of oncology Antimicrobials:   None at this time   Objective: Vitals:   03/24/19 1900 03/25/19 0435 03/26/19 0513 03/26/19 0522  BP: 113/81 113/77 106/75 133/66  Pulse: 88 92 90 73  Resp: 18 18 16 16   Temp:  98.6 F (37 C) 98.3 F (36.8 C) 97.7 F (36.5 C)  TempSrc:  Oral Oral Oral  SpO2: 95% 96% 95% 92%    Intake/Output Summary (Last 24 hours) at 03/26/2019 0751 Last data filed at 03/26/2019 0600 Gross per  24 hour  Intake 2379.59 ml  Output 1300 ml  Net 1079.59 ml   There were no vitals filed for this visit.  Examination: Awake alert coherent frail cachectic Midline scar Abdomen soft scaphoid nontender no rebound No lower extremity edema Neurologically intact no focal deficit Emaciated   Scheduled Meds: . atorvastatin  40 mg Oral q1800  . gabapentin  100 mg Oral TID  . multivitamin with minerals  1 tablet Oral Daily   Continuous Infusions: . sodium chloride 75 mL/hr at 03/25/19 2141     LOS: 2 days   Time spent: [place], MD [facility]

## 2019-03-26 NOTE — Progress Notes (Signed)
Received call from Dr. Verlon Au requesting outpatient follow up for lung mass/inguinal mass. Biopsy performed earlier today and results pending. I have requested outpatient new patient appt at the [facility] for week of 03/31/2019. Patient to be called with date/time.  Mikey Bussing, DNP, AGPCNP-BC, AOCNP

## 2019-03-26 NOTE — Procedures (Signed)
Met ca, left inguinal adenopathy  S/p [place] bx left inguinal met node  No comp Stable Path pending Full report in pacs

## 2019-03-26 NOTE — Plan of Care (Signed)
?  Problem: Clinical Measurements: ?Goal: Ability to maintain clinical measurements within normal limits will improve ?Outcome: Not Progressing ?  ?

## 2019-03-27 ENCOUNTER — Other Ambulatory Visit: Payer: Self-pay

## 2019-03-27 MED ORDER — SENNOSIDES-DOCUSATE SODIUM 8.6-50 MG PO TABS
1.0000 | ORAL_TABLET | Freq: Two times a day (BID) | ORAL | Status: DC
  Administered 2019-03-27: 1 via ORAL
  Filled 2019-03-27: qty 1

## 2019-03-27 MED ORDER — ACETAMINOPHEN 325 MG PO TABS: 650.0000 mg | ORAL_TABLET | ORAL | Status: DC | PRN

## 2019-03-27 MED ORDER — POLYETHYLENE GLYCOL 3350 17 G PO PACK
17.0000 g | PACK | Freq: Every day | ORAL | Status: DC
  Administered 2019-03-27: 17 g via ORAL
  Filled 2019-03-27: qty 1

## 2019-03-27 MED ORDER — POLYETHYLENE GLYCOL 3350 17 G PO PACK: 17.0000 g | PACK | Freq: Every day | ORAL | 0 refills | Status: DC

## 2019-03-27 MED ORDER — OXYCODONE-ACETAMINOPHEN 5-325 MG PO TABS: 1.0000 | ORAL_TABLET | Freq: Four times a day (QID) | ORAL | 0 refills | Status: DC | PRN

## 2019-03-27 NOTE — Progress Notes (Signed)
PT Cancellation Note  Patient Details Name: [name] MRN: [id number] DOB: 01-16-1956   Cancelled Treatment:    Reason Eval/Treat Not Completed: Patient declined, no reason specified.  Was eating and then just wanted to sit and digest some.  Retry later today.   Ramond Dial 03/27/2019, 12:48 PM   Mee Hives, PT MS Acute Rehab Dept. Number: [place] and [place]

## 2019-03-27 NOTE — TOC Transition Note (Signed)
Transition of Care [facility]) - CM/SW Discharge Note   Patient Details  Name: [name] MRN: [id number] Date of Birth: [birth year]  Transition [facility] Care [facility]) CM/SW Contact:  Bartholomew Crews, RN Phone Number: [phone] 03/27/2019, 3:38 PM   Clinical Narrative:    Spoke with patient at bedside with bedside RN. Patient not interactive. Declines HH PT and RW. Advised to follow up with PCP. States he will call someone to pick him up. Patient to transition home today.    Final next level of care: Home/Self Care Barriers to Discharge: No Barriers Identified   Patient Goals and CMS Choice Patient states their goals for this hospitalization and ongoing recovery are:: return home CMS Medicare.gov Compare Post Acute Care list provided to:: Patient Choice offered to / list presented to : Patient  Discharge Placement                       Discharge Plan and Services                DME Arranged: N/A(refused walker) DME Agency: NA       HH Arranged: Refused [place] Agency: NA        Social Determinants of Health (SDOH) Interventions     Readmission Risk Interventions No flowsheet data found.

## 2019-03-27 NOTE — Discharge Summary (Signed)
Physician Discharge Summary  [name] XNA:[id number] DOB: April 14, 1955 DOA: 03/24/2019  PCP: [facility]  Admit date: 03/24/2019 Discharge date: 03/27/2019  Time spent: 25 minutes  Recommendations for Outpatient Follow-up:  1. Will need outpatient coordination of cancer planning and diagnosis once seen  by oncologist in the outpatient setting  Discharge Diagnoses:  Active Problems:   Lung mass   Discharge Condition: Improved  Diet recommendation: Heart healthy  Filed Weights   03/26/19 2037  Weight: 49.4 kg   History of present illness:  63 year old male recent admission discharge 03/04/2019 went to [facility], prior cocaine abuse, tobacco abuse, CAD status post bypass surgery 04/29/2008--possible stroke at the time of CABG EF 30-35% in the past Recent discharge from [facility] 12/18 Came to emergency room with left groin pain-imaging showed 8 cm focus consolidation central right lung, 1.1 cm right lung base solid tumor  Hospital Course:  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 CT of chest shows bulky right hilar adenopathy and mass Biopsies were performed on 12/30 and will not be back for several days-patient lives alone-therapy was consulted to see the patient and I will order home health and a walker for discharge at the medication in the next couple of days I have discussed with oncology regarding plan of care and disposition and they will set up a close follow-up to discuss pathology once it gets back CAD status post bypass surgery 04/29/2008 Not on prior to admission medications may require low-dose beta-blocker or  Continue tobacco cocaine Urged cessation  Procedures:  Biopsy 12/31   Consultations:  IR  Oncology telephone consulted  Discharge Exam: Vitals:   03/27/19 0607 03/27/19 0905  BP: 117/76 105/72  Pulse: 87 95  Resp: 12 16  Temp: 99.5 F (37.5 C) 98.8 F (37.1 C)  SpO2: 96% 96%   Awake alert pleasant no distress EOMI NCAT some pain in  groin General:-Does not remember discussions from previously Cardiovascular: S1-S2 no murmur rub or gallop Respiratory: Clinically clear no added sound  Discharge Instructions  Discharge Instructions    Diet - low sodium heart healthy   Complete by: As directed    Discharge instructions   Complete by: As directed    Use Tylenol as first choice and oxycodone limited prescription second choice for pain Oncology department will call you on your cell phone to ensure that you have follow-up for your cancer related diagnosis and will call you with regards to planning and further instructions will be given by them   Increase activity slowly   Complete by: As directed        Allergies as of 03/27/2019   No Known Allergies     Medication List    TAKE these medications   acetaminophen 325 MG tablet Commonly known as: TYLENOL Take 2 tablets (650 mg total) by mouth every 4 (four) hours as needed for mild pain (or temp > 37.5 C (99.5 F)).   oxyCODONE-acetaminophen 5-325 MG tablet Commonly known as: PERCOCET/ROXICET Take 1 tablet by mouth every 6 (six) hours as needed for severe pain.   polyethylene glycol 17 g packet Commonly known as: MIRALAX / GLYCOLAX Take 17 g by mouth daily. Start taking on: March 28, 2019      No Known Allergies    The results of significant diagnostics from this hospitalization (including imaging, microbiology, ancillary and laboratory) are listed below for reference.    Significant Diagnostic Studies: CT ANGIO HEAD W OR WO CONTRAST  Result Date: 03/05/2019 CLINICAL DATA:  Stroke follow-up EXAM: CT ANGIOGRAPHY HEAD AND NECK TECHNIQUE: Multidetector CT imaging of the head and neck was performed using the standard protocol during bolus administration of intravenous contrast. Multiplanar CT image reconstructions and MIPs were obtained to evaluate the vascular anatomy. Carotid stenosis measurements (when applicable) are obtained utilizing NASCET criteria,  using the distal internal carotid diameter as the denominator. CONTRAST:  78mL OMNIPAQUE IOHEXOL 350 MG/ML SOLN COMPARISON:  None. FINDINGS: CTA NECK FINDINGS SKELETON: There is no bony spinal canal stenosis. No lytic or blastic lesion. OTHER NECK: Normal pharynx, larynx and major salivary glands. No cervical lymphadenopathy. Unremarkable thyroid gland. UPPER CHEST: No pneumothorax or pleural effusion. No nodules or masses. AORTIC ARCH: There is no calcific atherosclerosis of the aortic arch. There is no aneurysm, dissection or hemodynamically significant stenosis of the visualized portion of the aorta. Conventional 3 vessel aortic branching pattern. The visualized proximal subclavian arteries are widely patent. RIGHT CAROTID SYSTEM: No dissection, occlusion or aneurysm. Mild atherosclerotic calcification at the carotid bifurcation without hemodynamically significant stenosis. LEFT CAROTID SYSTEM: Normal without aneurysm, dissection or stenosis. VERTEBRAL ARTERIES: Left dominant configuration. Both origins are clearly patent. There is no dissection, occlusion or flow-limiting stenosis to the skull base (V1-V3 segments). CTA HEAD FINDINGS POSTERIOR CIRCULATION: --Vertebral arteries: Normal V4 segments. --Posterior inferior cerebellar arteries (PICA): Patent origins from the vertebral arteries. --Anterior inferior cerebellar arteries (AICA): Patent origins from the basilar artery. --Basilar artery: Normal. --Superior cerebellar arteries: Normal. --Posterior cerebral arteries: Normal. Both originate from the basilar artery. Posterior communicating arteries (p-comm) are diminutive or absent. ANTERIOR CIRCULATION: --Intracranial internal carotid arteries: There are severe stenoses of the cavernous segments of both internal carotid arteries due to calcific atherosclerosis. --Anterior cerebral arteries (ACA): Normal. Both A1 segments are present. Patent anterior communicating artery (a-comm). --Middle cerebral arteries  (MCA): There is occlusion of the mid M1 segment of the right middle cerebral artery. The M2 branches are patent. VENOUS SINUSES: As permitted by contrast timing, patent. ANATOMIC VARIANTS: None Review of the MIP images confirms the above findings. IMPRESSION: 1. 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 2. 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 These results were communicated to Dr. Roland Rack at 12:31 am on 03/05/2019 by text page via the [facility] messaging system. Electronically Signed   By: Ulyses Jarred M.D.   On: 03/05/2019 00:33   DG Abd 1 View  Result Date: 03/14/2019 CLINICAL DATA:  Vomiting. EXAM: ABDOMEN - 1 VIEW COMPARISON:  None. FINDINGS: No abnormal bowel dilatation is noted. Large amount of stool seen throughout the colon. No radio-opaque calculi or other significant radiographic abnormality are seen. IMPRESSION: Large stool burden.  No abnormal bowel dilatation. Electronically Signed   By: Marijo Conception M.D.   On: 03/14/2019 11:51   CT ANGIO NECK W OR WO CONTRAST  Result Date: 03/05/2019 CLINICAL DATA:  Stroke follow-up EXAM: CT ANGIOGRAPHY HEAD AND NECK TECHNIQUE: Multidetector CT imaging of the head and neck was performed using the standard protocol during bolus administration of intravenous contrast. Multiplanar CT image reconstructions and MIPs were obtained to evaluate the vascular anatomy. Carotid stenosis measurements (when applicable) are obtained utilizing NASCET criteria, using the distal internal carotid diameter as the denominator. CONTRAST:  108mL OMNIPAQUE IOHEXOL 350 MG/ML SOLN COMPARISON:  None. FINDINGS: CTA NECK FINDINGS SKELETON: There is no bony spinal canal stenosis. No lytic or blastic lesion. OTHER NECK: Normal pharynx, larynx and major salivary glands. No cervical  lymphadenopathy. Unremarkable thyroid gland. UPPER CHEST: No pneumothorax or pleural effusion. No  nodules or masses. AORTIC ARCH: There is no calcific atherosclerosis of the aortic arch. There is no aneurysm, dissection or hemodynamically significant stenosis of the visualized portion of the aorta. Conventional 3 vessel aortic branching pattern. The visualized proximal subclavian arteries are widely patent. RIGHT CAROTID SYSTEM: No dissection, occlusion or aneurysm. Mild atherosclerotic calcification at the carotid bifurcation without hemodynamically significant stenosis. LEFT CAROTID SYSTEM: Normal without aneurysm, dissection or stenosis. VERTEBRAL ARTERIES: Left dominant configuration. Both origins are clearly patent. There is no dissection, occlusion or flow-limiting stenosis to the skull base (V1-V3 segments). CTA HEAD FINDINGS POSTERIOR CIRCULATION: --Vertebral arteries: Normal V4 segments. --Posterior inferior cerebellar arteries (PICA): Patent origins from the vertebral arteries. --Anterior inferior cerebellar arteries (AICA): Patent origins from the basilar artery. --Basilar artery: Normal. --Superior cerebellar arteries: Normal. --Posterior cerebral arteries: Normal. Both originate from the basilar artery. Posterior communicating arteries (p-comm) are diminutive or absent. ANTERIOR CIRCULATION: --Intracranial internal carotid arteries: There are severe stenoses of the cavernous segments of both internal carotid arteries due to calcific atherosclerosis. --Anterior cerebral arteries (ACA): Normal. Both A1 segments are present. Patent anterior communicating artery (a-comm). --Middle cerebral arteries (MCA): There is occlusion of the mid M1 segment of the right middle cerebral artery. The M2 branches are patent. VENOUS SINUSES: As permitted by contrast timing, patent. ANATOMIC VARIANTS: None Review of the MIP images confirms the above findings. IMPRESSION: 1. 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 2. 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 These results were communicated to Dr. Roland Rack at 12:31 am on 03/05/2019 by text page via the [facility] messaging system. Electronically Signed   By: Ulyses Jarred M.D.   On: 03/05/2019 00:33   CT CHEST W CONTRAST  Result Date: 03/25/2019 CLINICAL DATA:  Abnormal spleen and abdominal adenopathy on CT abdomen. Concern for bronchogenic carcinoma EXAM: CT CHEST WITH CONTRAST TECHNIQUE: Multidetector CT imaging of the chest was performed during intravenous contrast administration. CONTRAST:  32mL OMNIPAQUE IOHEXOL 300 MG/ML  SOLN COMPARISON:  CT abdomen 03/24/2019. FINDINGS: Cardiovascular: Post CABG anatomy. Mediastinum/Nodes: No axillary adenopathy. No mediastinal adenopathy. Bulky hilar adenopathy/mass on the RIGHT. Nodal masses measure 3.8 and 3.5 cm (image 92/3. Rounded peribronchial thickening in the RIGHT upper lobe measures 2.6 cm (image 85/3. There is collapse of the lateral segment of the RIGHT middle lobe. Small lingular nodule measuring 5 mm (image 76/4). Subpleural nodule LEFT upper lobe measuring 5 mm on image 67/4. Lungs/Pleura: Upper Abdomen: Massively enlarged spleen as described on comparison abdominal CT. Adrenal glands not well imaged. Musculoskeletal: No aggressive osseous lesion IMPRESSION: 1. Bulky RIGHT hilar adenopathy and central perihilar mass. Mass involves the RIGHT upper lobe and RIGHT middle lobe lobe. Atelectasis of the RIGHT middle lobe. Findings most suggestive bronchogenic carcinoma. 2. Massively enlarged spleen consistent with malignancy as described on comparison abdominal CT. 3. No axillary or supraclavicular adenopathy. 4. Small LEFT upper lobe nodules.  Recommend attention on follow-up. Electronically Signed   By: Suzy Bouchard M.D.   On: 03/25/2019 12:14   MR Brain Wo Contrast (neuro protocol)  Result Date: 03/04/2019 CLINICAL DATA:  Ataxia EXAM: MRI HEAD WITHOUT CONTRAST TECHNIQUE: Multiplanar, multiecho pulse sequences  of the brain and surrounding structures were obtained without intravenous contrast. COMPARISON:  None. FINDINGS: Brain: There is multifocal acute ischemia within the right hemisphere, scattered within the right MCA territory with the largest  lesions in the right frontal lobe and anterior right temporal lobe. No acute hemorrhage. No midline shift or other mass effect. Brain volume is normal. No hydrocephalus. There is mild cytotoxic edema associated with the ischemic sites but the brain parenchyma is otherwise normal. No chronic microhemorrhage. The midline structures are normal. Vascular: Flow voids are preserved. Skull and upper cervical spine: Normal bone marrow signal. Visualized extracranial soft tissues are normal. Sinuses/Orbits: Orbits are normal. No mastoid or middle ear effusion. Paranasal sinuses are clear. Other: None IMPRESSION: Multifocal acute ischemia within the right MCA territory. No hemorrhage or mass effect. Electronically Signed   By: Ulyses Jarred M.D.   On: 03/04/2019 19:11   CT ABDOMEN PELVIS W CONTRAST  Result Date: 03/24/2019 CLINICAL DATA:  Left lower quadrant abdominal pain. Painful left inguinal mass. EXAM: CT ABDOMEN AND PELVIS WITH CONTRAST TECHNIQUE: Multidetector CT imaging of the abdomen and pelvis was performed using the standard protocol following bolus administration of intravenous contrast. CONTRAST:  174mL OMNIPAQUE IOHEXOL 300 MG/ML  SOLN COMPARISON:  03/04/2019 chest radiograph. FINDINGS: Lower chest: Incompletely visualized large masslike focus of consolidation at the central right lung base involving right lower and right middle lobes measuring at least 8.0 x 6.1 cm (series 3/image 1). Separate 1.1 cm solid basilar right lower lobe pulmonary nodule (series 4/image 6). Coronary atherosclerosis. Hepatobiliary: Normal liver size. Subcentimeter hypodense segment 7 right liver lesion (series 3/image 20), too small to characterize. No additional liver lesions. Normal  gallbladder with no radiopaque cholelithiasis. No biliary ductal dilatation. Pancreas: No pancreatic mass. Borderline prominent main pancreatic duct (3 mm diameter). Spleen: Enlarged markedly abnormal spleen (15 cm craniocaudal length) with markedly heterogeneous soft tissue density. Adrenals/Urinary Tract: No discrete adrenal nodules. Normal kidneys with no hydronephrosis and no renal mass. Normal bladder. Stomach/Bowel: Normal non-distended stomach. Normal caliber small bowel with no small bowel wall thickening. Appendix not discretely visualized. No pericecal inflammatory changes. Normal large bowel with no diverticulosis, large bowel wall thickening or pericolonic fat stranding. Vascular/Lymphatic: Normal caliber abdominal aorta. Patent portal, splenic, hepatic and renal veins. Enlarged centrally necrotic left inguinal lymph nodes, largest 2.6 cm short axis diameter (series 3/image 71). Enlarged centrally necrotic 2.5 cm left external iliac lymph node (series 3/image 63). Enlarged centrally necrotic 2.3 cm left common iliac node (series 3/image 56). Enlarged centrally necrotic left para-aortic lymph nodes up to 2.9 cm short axis diameter (series 3/image 35). Reproductive: Top-normal size prostate. Other: No pneumoperitoneum. No focal fluid collections. Small volume pelvic free fluid. Musculoskeletal: No aggressive appearing focal osseous lesions. Mild-to-moderate thoracolumbar spondylosis. IMPRESSION: 1. Incompletely imaged large 8 cm masslike focus of consolidation in the central right lung, cannot exclude primary bronchogenic carcinoma. Separate 1.1 cm right lung base solid pulmonary nodule. Dedicated chest CT with IV contrast recommended for further characterization. 2. Markedly abnormal enlarged spleen with heterogeneous soft tissue attenuation, nonspecific, suspicious for metastatic disease. 3. Centrally necrotic lymphadenopathy in the left para-aortic, left common iliac, left external iliac and left  inguinal chains, suspicious for metastatic disease. The left inguinal adenopathy should be amenable to percutaneous biopsy. 4. Subcentimeter low-attenuation segment 7 right liver lesion, too small to characterize, for which follow-up imaging is advised. 5. Small volume pelvic free fluid. Electronically Signed   By: Ilona Sorrel M.D.   On: 03/24/2019 17:20   CT CEREBRAL PERFUSION W CONTRAST  Result Date: 03/05/2019 CLINICAL DATA:  Stroke follow-up. History of cocaine abuse. EXAM: CT PERFUSION BRAIN TECHNIQUE: Multiphase CT imaging of the brain was performed following IV bolus  contrast injection. Subsequent parametric perfusion maps were calculated using RAPID software. CONTRAST:  25mL OMNIPAQUE IOHEXOL 350 MG/ML SOLN COMPARISON:  None. FINDINGS: CT Brain Perfusion Findings: CBF (<30%) Volume: 32mL Perfusion (Tmax>6.0s) volume: 62mL. T-max is elevated throughout much of the right MCA territory. Using a Tmax threshold of 4 seconds instead of 6 seconds demonstrates a larger region of ischemia measuring 100 mL. Mismatch Volume: 55mL Infarct Core: 0 mL Infarction Location: CT perfusion parameters do not demonstrated core infarct. However, earlier brain MRI shows abnormal diffusion restriction at multiple locations in the right MCA territory. No recent non-contrast head CT is available for determining ASPECTS. IMPRESSION: 1. No core infarct identified by CT perfusion parameters. However, earlier brain MRI shows abnormal diffusion restriction at multiple locations in the right MCA territory. 2. 7 mL region within the right frontal operculum meeting parameters for ischemic penumbra. Using a Tmax threshold of 4 seconds instead of 6 seconds demonstrates a larger region of ischemia measuring 100 mL. Electronically Signed   By: Ulyses Jarred M.D.   On: 03/05/2019 01:47   DG Chest Port 1 View  Result Date: 03/04/2019 CLINICAL DATA:  Weakness, fall. EXAM: PORTABLE CHEST 1 VIEW COMPARISON:  April 18, 2017. FINDINGS: The  heart size and mediastinal contours are within normal limits. Status post coronary bypass graft. No pneumothorax or pleural effusion is noted. Left lung is clear. Large rounded density is noted in right lower lobe concerning for possible neoplasm. The visualized skeletal structures are unremarkable. IMPRESSION: Large rounded density seen in right lower lobe concerning for possible neoplasm. CT scan of the chest is recommended for further evaluation. Electronically Signed   By: Marijo Conception M.D.   On: 03/04/2019 15:49   ECHOCARDIOGRAM COMPLETE  Result Date: 03/05/2019   ECHOCARDIOGRAM REPORT   Patient Name:   DODGER SINNING Date of Exam: 03/05/2019 Medical Rec #:  [id number]       Height:       67.0 in Accession #:    [id number]      Weight:       119.1 lb Date of Birth:  May 12, 1955       BSA:          1.62 m Patient Age:    54 years        BP:           106/72 mmHg Patient Gender: M               HR:           75 bpm. Exam Location:  Inpatient Procedure: 2D Echo Indications:    Stroke 434.91 / I163.9  History:        Patient has prior history of Echocardiogram examinations, most                 recent 10/17/2017. CAD; Risk Factors:Current Smoker and                 Dyslipidemia. Polysubstance Abuse.  Sonographer:    Leavy Cella Referring Phys: [place] MOHAMMAD L GARBA IMPRESSIONS  1. EF 40-45% with global hypokinesis. There is akinesis of the apical septum, apical inferior, and apical segments. There is no obvious thrombus present in the LV, but would recommend a repeat contrast study to exclude apical thrombus in the patient with concerns for stroke.  2. Left ventricular ejection fraction, by visual estimation, is 40 to 45%. The left ventricle has mildly decreased function. There is no left ventricular hypertrophy.  3.  Apical septal segment, apical inferior segment, and apex are abnormal.  4. The left ventricle demonstrates regional wall motion abnormalities.  5. Global right ventricle has normal systolic  function.The right ventricular size is normal. No increase in right ventricular wall thickness.  6. Left atrial size was normal.  7. Right atrial size was normal.  8. The mitral valve is myxomatous. Trivial mitral valve regurgitation.  9. The tricuspid valve is grossly normal. Tricuspid valve regurgitation is trivial. 10. The aortic valve is tricuspid. Aortic valve regurgitation is not visualized. No evidence of aortic valve sclerosis or stenosis. 11. The pulmonic valve was grossly normal. Pulmonic valve regurgitation is not visualized. 12. TR signal is inadequate for assessing pulmonary artery systolic pressure. 13. The inferior vena cava is normal in size with greater than 50% respiratory variability, suggesting right atrial pressure of 3 mmHg. 14. A prior study was performed on 10/17/2017. 15. Changes from prior study are noted. 16. EF slightly improved ~45% compared with prior. WMA remain unchanged. FINDINGS  Left Ventricle: Left ventricular ejection fraction, by visual estimation, is 40 to 45%. The left ventricle has mildly decreased function. The left ventricle demonstrates regional wall motion abnormalities. The left ventricular internal cavity size was the left ventricle is normal in size. There is no left ventricular hypertrophy. EF 40-45% with global hypokinesis. There is akinesis of the apical septum, apical inferior, and apical segments. There is no obvious thrombus present in the LV, but would recommend a repeat contrast study to exclude apical thrombus in the patient with concerns for stroke.  LV Wall Scoring: The apical septal segment, apical inferior segment, and apex are akinetic. Right Ventricle: The right ventricular size is normal. No increase in right ventricular wall thickness. Global RV systolic function is has normal systolic function. Left Atrium: Left atrial size was normal in size. Right Atrium: Right atrial size was normal in size Pericardium: There is no evidence of pericardial effusion.  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 Tricuspid Valve: The tricuspid valve is grossly normal. Tricuspid valve regurgitation is trivial. Aortic Valve: The aortic valve is tricuspid. Aortic valve regurgitation is not visualized. The aortic valve is structurally normal, with no evidence of sclerosis or stenosis. Pulmonic Valve: The pulmonic valve was grossly normal. Pulmonic valve regurgitation is not visualized. Pulmonic regurgitation is not visualized. Aorta: The aortic root is normal in size and structure. Venous: The inferior vena cava is normal in size with greater than 50% respiratory variability, suggesting right atrial pressure of 3 mmHg. IAS/Shunts: No atrial level shunt detected by color flow Doppler. Additional Comments: A prior study was performed on 10/17/2017.  LEFT VENTRICLE PLAX 2D LVIDd:         4.50 cm  Diastology LVIDs:         3.40 cm  LV e' lateral:   8.95 cm/s LV PW:         1.20 cm  LV E/e' lateral: 6.4 LV IVS:        1.10 cm  LV e' medial:    7.09 cm/s LVOT diam:     2.20 cm  LV E/e' medial:  8.0 LV SV:         45 ml LV SV Index:   28.32 LVOT Area:     3.80 cm  RIGHT VENTRICLE RV S prime:     14.40 cm/s TAPSE (M-mode): 1.5 cm LEFT ATRIUM           Index  RIGHT ATRIUM           Index LA diam:      3.20 cm 1.97 cm/m  RA Area:     11.40 cm LA Vol (A2C): 31.0 ml 19.11 ml/m RA Volume:   24.90 ml  15.35 ml/m LA Vol (A4C): 44.3 ml 27.30 ml/m   AORTA Ao Root diam: 3.50 cm MITRAL VALVE MV Area (PHT): 5.42 cm             SHUNTS MV PHT:        40.60 msec           Systemic Diam: 2.20 cm MV Decel Time: 140 msec MV E velocity: 57.00 cm/s 103 cm/s MV A velocity: 91.70 cm/s 70.3 cm/s MV E/A ratio:  0.62       1.5  Eleonore Chiquito MD Electronically signed by Eleonore Chiquito MD Signature Date/Time: 03/05/2019/2:53:46 PM    Final    [place] LT LOWER EXTREM LTD SOFT TISSUE NON VASCULAR  Result Date: 03/14/2019 CLINICAL DATA:  Palpable lump over the left inguinal  region. EXAM: ULTRASOUND LEFT LOWER EXTREMITY LIMITED TECHNIQUE: Ultrasound examination of the lower extremity soft tissues was performed in the area of clinical concern. COMPARISON:  None. FINDINGS: Joint Space: Not evaluated. Muscles: Normal. Tendons: Not evaluated. Other Soft Tissue Structures: Ultrasound examination over patient's palpable abnormality in the left inguinal region demonstrates an oval homogeneously hypoechoic mass just below the skin measuring 1.4 x 2.2 x 2.4 cm. This has increased through transmission. There is a normal adjacent 9 mm lymph node. There is no communication with the underlying femoral artery and vein. This likely represents a abnormal inguinal lymph node although a complex fluid collection such as subcutaneous abscess or hematoma could have a similar appearance. There is internal vascularity favoring a solid mass such as abnormal lymph node. IMPRESSION: Oval hypoechoic mass just below the skin over the left inguinal region corresponding to patient's palpable abnormality. This could represent an abnormal lymph node versus complex fluid collection such as hematoma or subcutaneous abscess. Recommend clinical correlation. Electronically Signed   By: Marin Olp M.D.   On: 03/14/2019 12:46   [place] CORE BIOPSY (LYMPH NODES)  Result Date: 03/26/2019 INDICATION: Left inguinal metastatic adenopathy, no known primary EXAM: ULTRASOUND GUIDED CORE BIOPSY OF LEFT INGUINAL METASTATIC ADENOPATHY MEDICATIONS: 1% LIDOCAINE LOCAL ANESTHESIA/SEDATION: Versed 1.5mg  IV; Fentanyl 46mcg IV; Moderate Sedation Time:  10 MINUTES The patient was continuously monitored during the procedure by the interventional radiology nurse under my direct supervision. FLUOROSCOPY TIME:  Fluoroscopy Time: None. COMPLICATIONS: None immediate. PROCEDURE: The procedure, risks, benefits, and alternatives were explained to the patient. Questions regarding the procedure were encouraged and answered. The patient understands  and consents to the procedure. The left inguinal area was prepped with ChloraPrep in a sterile fashion, and a sterile drape was applied covering the operative field. A sterile gown and sterile gloves were used for the procedure. Local anesthesia was provided with 1% Lidocaine. Previous imaging reviewed. Preliminary ultrasound performed. The left inguinal adenopathy was localized and marked. Under sterile conditions and local anesthesia, 18 gauge core biopsy was advanced into the left inguinal adenopathy. Several 18 gauge core biopsies obtained. These were intact and placed in saline. Postprocedure imaging demonstrates no hemorrhage or hematoma. Patient tolerated the biopsy well. FINDINGS: Imaging confirms needle placed in the left inguinal adenopathy for core biopsy IMPRESSION: Successful ultrasound left inguinal adenopathy 18 gauge core biopsies Electronically Signed   By: Jerilynn Mages.  Shick M.D.   On: 03/26/2019  14:23   DG Hip Unilat With Pelvis 2-3 Views Left  Result Date: 03/04/2019 CLINICAL DATA:  [place] today with left hip pain. EXAM: DG HIP (WITH OR WITHOUT PELVIS) 2-3V LEFT COMPARISON:  None. FINDINGS: No evidence of acute pelvic fracture. No evidence of left hip fracture. Old healed right femoral neck fracture IMPRESSION: No acute or traumatic finding. Electronically Signed   By: Nelson Chimes M.D.   On: 03/04/2019 13:34   ECHOCARDIOGRAM LIMITED  Result Date: 03/06/2019   ECHOCARDIOGRAM LIMITED REPORT   Patient Name:   SELSO MANNOR Date of Exam: 03/06/2019 Medical Rec #:  [id number]       Height:       67.0 in Accession #:    [id number]      Weight:       119.1 lb Date of Birth:  05/03/1955       BSA:          1.62 m Patient Age:    63 years        BP:           111/79 mmHg Patient Gender: M               HR:           74 bpm. Exam Location:  Inpatient  Procedure: Limited Echo and Intracardiac Opacification Agent Indications:    Stroke 434.91 / I163.9  History:        Patient has prior history of  Echocardiogram examinations, most                 recent 03/05/2019. Cocaine abuse.  Sonographer:    Tiffany Dance Referring Phys: [id number] [place]  1. Left ventricular ejection fraction, by visual estimation, is 35 to 40%. The left ventricle has moderately decreased function. There is no left ventricular hypertrophy.  2. The left ventricle demonstrates regional wall motion abnormalities.  3. Global right ventricle has normal systolic function.The right ventricular size is normal.  4. Left atrial size was not assessed.  5. Right atrial size was normal.  6. Moderate pleural effusion.  7. The mitral valve was not assessed.  8. The tricuspid valve is not assessed.  9. The aortic valve was not assessed. 10. The pulmonic valve was not assessed. 11. Aortic root could not be assessed. 12. Limited study; no doppler performed; apical akinesis with moderate LV dysfunction; using definity, no obvious thrombus noted. FINDINGS  Left Ventricle: Left ventricular ejection fraction, by visual estimation, is 35 to 40%. The left ventricle has moderately decreased function. The left ventricle demonstrates regional wall motion abnormalities. Right Ventricle: The right ventricular size is normal. Global RV systolic function is has normal systolic function. Left Atrium: Left atrial size was not assessed. Right Atrium: Right atrial size was normal in size Pericardium: There is no evidence of pericardial effusion. There is a moderate pleural effusion in either the left or right lateral region. Mitral Valve: The mitral valve was not assessed. Tricuspid Valve: The tricuspid valve is not assessed. Aortic Valve: The aortic valve was not assessed. Pulmonic Valve: The pulmonic valve was not assessed. Aorta: Aortic root could not be assessed.  Additional Comments: Limited study; no doppler performed; apical akinesis with moderate LV dysfunction; using definity, no obvious thrombus noted.  Kirk Ruths MD Electronically signed by  Kirk Ruths MD Signature Date/Time: 03/06/2019/3:22:00 PM    Final     Microbiology: Recent Results (from the past 240 hour(s))  SARS CORONAVIRUS 2 (TAT 6-24  HRS) Nasopharyngeal Nasopharyngeal Swab     Status: None   Collection Time: 03/24/19  5:40 PM   Specimen: Nasopharyngeal Swab  Result Value Ref Range Status   SARS Coronavirus 2 NEGATIVE NEGATIVE Final    Comment: (NOTE) SARS-CoV-2 target nucleic acids are NOT DETECTED. The SARS-CoV-2 RNA is generally detectable in upper and lower respiratory specimens during the acute phase of infection. Negative results do not preclude SARS-CoV-2 infection, do not rule out co-infections with other pathogens, and should not be used as the sole basis for treatment or other patient management decisions. Negative results must be combined with clinical observations, patient history, and epidemiological information. The expected result is Negative. Fact Sheet for Patients: SugarRoll.be Fact Sheet for Healthcare Providers: https://www.woods-mathews.com/ This test is not yet approved or cleared by the [place] FDA and  has been authorized for detection and/or diagnosis of SARS-CoV-2 by FDA under an Emergency Use Authorization (EUA). This EUA will remain  in effect (meaning this test can be used) for the duration of the COVID-19 declaration under Section 56 4(b)(1) of the Act, 21 U.S.C. section 360bbb-3(b)(1), unless the authorization is terminated or revoked sooner. Performed at [facility], [place] [address], [place], [place] [address]      Labs: Basic Metabolic Panel: Recent Labs  Lab 03/24/19 1420 03/25/19 0536  NA 136 137  K 4.0 4.2  CL 101 101  CO2 24 23  GLUCOSE 101* 97  BUN 11 12  CREATININE 0.85 0.83  CALCIUM 9.2 9.1   Liver Function Tests: Recent Labs  Lab 03/24/19 1420 03/25/19 0536  AST 26 25  ALT 15 12  ALKPHOS 63 71  BILITOT 0.6 0.8  PROT 7.4 7.0  ALBUMIN 2.8*  2.6*   No results for input(s): LIPASE, AMYLASE in the last 168 hours. No results for input(s): AMMONIA in the last 168 hours. CBC: Recent Labs  Lab 03/24/19 1420 03/25/19 0536  WBC 8.5 10.2  NEUTROABS 7.2  --   HGB 13.0 12.6*  HCT 39.7 38.3*  MCV 91.1 90.8  PLT 452* 398   Cardiac Enzymes: No results for input(s): CKTOTAL, CKMB, CKMBINDEX, TROPONINI in the last 168 hours. BNP: BNP (last 3 results) No results for input(s): BNP in the last 8760 hours.  ProBNP (last 3 results) No results for input(s): PROBNP in the last 8760 hours.  CBG: No results for input(s): GLUCAP in the last 168 hours.     Signed:  Nita Sells MD   [facility] 03/27/2019, 2:04 PM

## 2019-03-27 NOTE — Evaluation (Signed)
Physical Therapy Evaluation Patient Details Name: [name] MRN: [id number] DOB: 11-14-1955 Today's Date: 03/27/2019   History of Present Illness  63 y.o. male was admitted for groin pain and noted to have lymphatic necrosis lymphadeopathy of L aortic, inguinal chains.  Has new findings of splenic susp mets, R lung mass.  PMHx:  polysubstance abuse including cocaine, CAD with stents as well as CABG back in 2010, ischemic cardiomyopathy with EF of 30 to 35%, tobacco abuse, HTN, Multifocal acute ischemia w R MCA ischemia and R frontal ischemia  Clinical Impression  Pt was seen for mobility on no AD at his insistence, but encouraged him to consider use of RW.  Pt is in a living situation that he does not like, but is not interested in staying in a skilled environment.  He is a good candidate for home with RW and home therapy vs outpatient therapy, and will need to do this to manage his higher fall risk and avoid an injury.  Per pt he has had some falls but is not willing to consider some good options to avoid further risk.  Follow acutely for these needs, and focus on increasing balance and endurance to manage his safety with pt being likely to refuse follow up services.    Follow Up Recommendations Home health PT;Outpatient PT    Equipment Recommendations  Rolling walker with 5" wheels    Recommendations for Other Services       Precautions / Restrictions Precautions Precautions: Fall Precaution Comments: L side weakness Restrictions Weight Bearing Restrictions: No      Mobility  Bed Mobility                  Transfers Overall transfer level: Needs assistance Equipment used: 1 person hand held assist Transfers: Sit to/from Stand Sit to Stand: Supervision         General transfer comment: pt can stand but is taking extra time to catch standing balance, but refuses AD  Ambulation/Gait Ambulation/Gait assistance: Supervision;Min guard Gait Distance (Feet): 25  Feet Assistive device: None Gait Pattern/deviations: Step-to pattern;Step-through pattern;Decreased step length - left;Decreased stance time - left Gait velocity: decreased Gait velocity interpretation: <1.8 ft/sec, indicate of risk for recurrent falls General Gait Details: pt declined an AD but is in flexed posture with wb preferentially to RLE, refused to let PT assist him  Stairs            Wheelchair Mobility    Modified Rankin (Stroke Patients Only) Modified Rankin (Stroke Patients Only) Pre-Morbid Rankin Score: Slight disability Modified Rankin: Moderately severe disability     Balance Overall balance assessment: Needs assistance Sitting-balance support: Feet supported Sitting balance-Leahy Scale: Fair     Standing balance support: No upper extremity supported Standing balance-Leahy Scale: Fair Standing balance comment: dynamic balance is fair to fair-                             Pertinent Vitals/Pain Pain Assessment: No/denies pain    Home Living Family/patient expects to be discharged to:: Private residence Living Arrangements: Alone Available Help at Discharge: Available PRN/intermittently;Friend(s) Type of Home: House Home Access: Stairs to enter   [employer] of Steps: 2 Home Layout: One level Home Equipment: None Additional Comments: does not drive, has limited living assistance    Prior Function Level of Independence: Independent(reports leaving [facility] in Dec 2020)         Comments: prev work with concrete  Hand Dominance   Dominant Hand: Right    Extremity/Trunk Assessment   Upper Extremity Assessment Upper Extremity Assessment: Overall WFL for tasks assessed    Lower Extremity Assessment Lower Extremity Assessment: LLE deficits/detail LLE Deficits / Details: DF 0 deg, hip in flexion contracture LLE Coordination: decreased gross motor    Cervical / Trunk Assessment Cervical / Trunk Assessment: Kyphotic(may  be related to LE strength and control)  Communication   Communication: No difficulties  Cognition Arousal/Alertness: Awake/alert Behavior During Therapy: WFL for tasks assessed/performed Overall Cognitive Status: Within Functional Limits for tasks assessed Area of Impairment: Safety/judgement;Problem solving                         Safety/Judgement: Decreased awareness of safety;Decreased awareness of deficits Awareness: Emergent Problem Solving: Slow processing;Requires verbal cues General Comments: postural shift to stand on RLE more than L       General Comments General comments (skin integrity, edema, etc.): Pt is unwilling to consider using an AD but is not feeling safe to go home.  Does not want to go to SNF, but would benefit from rehab to complete his therapy with recent medical findings    Exercises     Assessment/Plan    PT Assessment Patient needs continued PT services  PT Problem List Decreased strength;Decreased range of motion;Decreased activity tolerance;Decreased balance;Decreased mobility;Decreased coordination;Decreased knowledge of use of DME;Decreased safety awareness;Cardiopulmonary status limiting activity       PT Treatment Interventions DME instruction;Gait training;Stair training;Functional mobility training;Therapeutic activities;Therapeutic exercise;Balance training;Neuromuscular re-education;Patient/family education    PT Goals (Current goals can be found in the Care Plan section)  Acute Rehab PT Goals Patient Stated Goal: to find a better place to live PT Goal Formulation: With patient Time For Goal Achievement: 04/10/19 Potential to Achieve Goals: Good    Frequency Min 3X/week   Barriers to discharge Inaccessible home environment;Decreased caregiver support home alone with no adaptive equipment    Co-evaluation               AM-PAC PT "6 Clicks" Mobility  Outcome Measure Help needed turning from your back to your side while  in a flat bed without using bedrails?: None Help needed moving from lying on your back to sitting on the side of a flat bed without using bedrails?: A Little Help needed moving to and from a bed to a chair (including a wheelchair)?: A Little Help needed standing up from a chair using your arms (e.g., wheelchair or bedside chair)?: A Little Help needed to walk in hospital room?: A Little Help needed climbing 3-5 steps with a railing? : A Lot 6 Click Score: 18    End of Session Equipment Utilized During Treatment: (refused all assistive equipment) Activity Tolerance: Patient limited by fatigue;Treatment limited secondary to medical complications (Comment) Patient left: in bed;with call bell/phone within reach;with bed alarm set Nurse Communication: Mobility status PT Visit Diagnosis: Other abnormalities of gait and mobility (R26.89);Hemiplegia and hemiparesis Hemiplegia - Right/Left: Left Hemiplegia - dominant/non-dominant: Non-dominant Hemiplegia - caused by: Cerebral infarction;Nontraumatic intracerebral hemorrhage    Time: 1440-1510 PT Time Calculation (min) (ACUTE ONLY): 30 min   Charges:             Ramond Dial 03/27/2019, 4:29 PM   Mee Hives, PT MS Acute Rehab Dept. Number: [place] [facility] [place]

## 2019-03-27 NOTE — Progress Notes (Signed)
[name] to be discharged Home per MD order. Discussed prescriptions and follow up appointments with the patient. Prescriptions given to patient; medication list explained in detail. Patient verbalized understanding.  Skin clean, dry and intact without evidence of skin break down, no evidence of skin tears noted. IV catheter discontinued intact. Site without signs and symptoms of complications. Dressing and pressure applied. Pt denies pain at the site currently. No complaints noted.  Patient free of lines, drains, and wounds.   An After Visit Summary (AVS) was printed and given to the patient. Patient escorted via wheelchair, and discharged home via private auto.  Amaryllis Dyke, RN

## 2019-03-31 ENCOUNTER — Telehealth: Payer: Self-pay | Admitting: *Deleted

## 2019-03-31 NOTE — Telephone Encounter (Signed)
Oncology Nurse Navigator Documentation  Oncology Nurse Navigator Flowsheets 03/31/2019  Navigator Location [facility]  Navigator Encounter Type Telephone/I called [name].  I updated him on his appt to see Dr. Julien Nordmann on 04/03/19.  He verbzliaed understanding of appt time and place   Telephone Outgoing Call  Treatment Phase Abnormal Scans  Barriers/Navigation Needs Coordination of Care;Education  Education Other  Interventions Coordination of Care;Education  Acuity Level 2-Minimal Needs (1-2 Barriers Identified)  Coordination of Care Appts  Education Method Verbal  Time Spent with Patient 15

## 2019-04-02 ENCOUNTER — Other Ambulatory Visit: Payer: Self-pay | Admitting: Oncology

## 2019-04-02 DIAGNOSIS — R918 Other nonspecific abnormal finding of lung field: Secondary | ICD-10-CM

## 2019-04-02 LAB — SURGICAL PATHOLOGY

## 2019-04-02 NOTE — Progress Notes (Signed)
Received a call from pathology who indicated that biopsy from inguinal lymph node was mostly necrotic tissue.  Dr. Melina Copa recommended excisional lymph node biopsy.  Discussed with Dr. Julien Nordmann who is scheduled to see the patient on 04/03/2019.  He recommended obtaining a PET scan which I have ordered today.  Mikey Bussing, DNP, AGPCNP-BC, AOCNP

## 2019-04-03 ENCOUNTER — Encounter: Payer: Self-pay | Admitting: *Deleted

## 2019-04-03 ENCOUNTER — Other Ambulatory Visit: Payer: Self-pay

## 2019-04-03 ENCOUNTER — Encounter: Payer: Self-pay | Admitting: Internal Medicine

## 2019-04-03 ENCOUNTER — Inpatient Hospital Stay: Payer: Medicaid Other | Attending: Internal Medicine | Admitting: Internal Medicine

## 2019-04-03 ENCOUNTER — Other Ambulatory Visit: Payer: Self-pay | Admitting: *Deleted

## 2019-04-03 ENCOUNTER — Ambulatory Visit (HOSPITAL_COMMUNITY)
Admission: EM | Admit: 2019-04-03 | Discharge: 2019-04-03 | Disposition: A | Discharge status: Home / Self Care | Payer: Medicaid Other

## 2019-04-03 DIAGNOSIS — R918 Other nonspecific abnormal finding of lung field: Secondary | ICD-10-CM | POA: Diagnosis not present

## 2019-04-03 DIAGNOSIS — R630 Anorexia: Secondary | ICD-10-CM | POA: Insufficient documentation

## 2019-04-03 DIAGNOSIS — R0789 Other chest pain: Secondary | ICD-10-CM | POA: Insufficient documentation

## 2019-04-03 DIAGNOSIS — I255 Ischemic cardiomyopathy: Secondary | ICD-10-CM | POA: Insufficient documentation

## 2019-04-03 DIAGNOSIS — R161 Splenomegaly, not elsewhere classified: Secondary | ICD-10-CM | POA: Insufficient documentation

## 2019-04-03 DIAGNOSIS — R634 Abnormal weight loss: Secondary | ICD-10-CM

## 2019-04-03 DIAGNOSIS — Z79899 Other long term (current) drug therapy: Secondary | ICD-10-CM | POA: Insufficient documentation

## 2019-04-03 DIAGNOSIS — G893 Neoplasm related pain (acute) (chronic): Secondary | ICD-10-CM | POA: Insufficient documentation

## 2019-04-03 DIAGNOSIS — F17210 Nicotine dependence, cigarettes, uncomplicated: Secondary | ICD-10-CM | POA: Diagnosis not present

## 2019-04-03 DIAGNOSIS — I2510 Atherosclerotic heart disease of native coronary artery without angina pectoris: Secondary | ICD-10-CM | POA: Insufficient documentation

## 2019-04-03 DIAGNOSIS — R05 Cough: Secondary | ICD-10-CM

## 2019-04-03 DIAGNOSIS — R590 Localized enlarged lymph nodes: Secondary | ICD-10-CM | POA: Diagnosis not present

## 2019-04-03 DIAGNOSIS — Z951 Presence of aortocoronary bypass graft: Secondary | ICD-10-CM | POA: Insufficient documentation

## 2019-04-03 DIAGNOSIS — R5383 Other fatigue: Secondary | ICD-10-CM | POA: Insufficient documentation

## 2019-04-03 DIAGNOSIS — R109 Unspecified abdominal pain: Secondary | ICD-10-CM | POA: Insufficient documentation

## 2019-04-03 DIAGNOSIS — R091 Pleurisy: Secondary | ICD-10-CM

## 2019-04-03 NOTE — Progress Notes (Signed)
The proposed treatment discussed in cancer conference 04/03/19 is for discussion purpose only and is not binding recommendation.  The patient was not physically examined nor present for their treatment options.  Therefore, final treatment plans cannot be decided.

## 2019-04-03 NOTE — ED Notes (Signed)
Pt confused about where to go for his appointment today.  Got him address and directions and called facility to let them know he might be late for his appointment.

## 2019-04-03 NOTE — Progress Notes (Signed)
Oncology Nurse Navigator Documentation  Oncology Nurse Navigator Flowsheets 04/03/2019  Navigator Location [facility]  Navigator Encounter Type Clinic/MDC/I spoke with [name] today.  He was confused about his appt and the reason for coming.  I and Dr. Julien Nordmann explained plan of care.  Patient will need PET scan and then to see Dr. Julien Nordmann back.  I explained next steps.  He verbalized understanding.    Telephone -  [facility] Date 04/03/2019  Multidisiplinary Clinic Type Thoracic  Patient Visit Type MedOnc  Treatment Phase Abnormal Scans  Barriers/Navigation Needs Education  Education Other  Interventions Education  Acuity Level 2-Minimal Needs (1-2 Barriers Identified)  Coordination of Care -  Education Method Verbal  Time Spent with Patient 30

## 2019-04-03 NOTE — Progress Notes (Signed)
[place] [phone]   [phone] Multidisciplinary thoracic oncology clinic  CONSULT NOTE  REFERRING PHYSICIAN: Nita Sells, MD  REASON FOR CONSULTATION:  64 years old African-American male with suspicious metastatic cancer.  HPI [name] is a 64 y.o. male with past medical history significant for coronary artery disease status post CABG as well as history of cocaine abuse and ischemic cardiomyopathy.  The patient presented to the emergency department complaining of pain in the left groin area that has been going on for few weeks.  He had imaging studies performed during his evaluation at the emergency department including CT scan of the abdomen and pelvis on March 24, 2019 and that showed incompletely imaged large 8.0 cm masslike focus of consolidation in the central right lung suspicious for primary bronchogenic carcinoma.  There was separate 1.1 cm right lung base solid nodule and markedly abnormal enlarged spleen with heterogeneous soft tissue attenuation nonspecific suspicious for metastatic disease.  There was also centrally necrotic lymphadenopathy in the left periaortic, left common iliac, left external iliac and left inguinal chains suspicious for metastatic disease.  The patient had CT scan of the chest on March 25, 2019 and it showed bulky right hilar adenopathy and central perihilar mass.  The mass involved with the right upper lobe and right middle lobe.  There was atelectasis of the right middle lobe and the finding suggestive of bronchogenic carcinoma.  There was also massively enlarged spleen consistent with malignancy.  There was no axillary or supraclavicular adenopathy and there was a small left upper lobe nodules.  On March 26, 2019 the patient underwent ultrasound-guided core biopsy of the left inguinal metastatic adenopathy by interventional radiology.  The final pathology showed necrotic tissue with no evidence  for malignancy. The patient was referred to the multidisciplinary thoracic oncology clinic today for evaluation and recommendation regarding his condition. When seen today he continues to have soreness and tenderness in the left side of the anterior lower chest wall with some prominence of the lower ribs.  The patient also has mild cough but no shortness of breath or hemoptysis.  He lost around 30 pounds in the last 3 months.  He has no nausea, vomiting, diarrhea or constipation.  He has no headache or visual changes. Family history significant for sister with cancer.  He does not know the medical history of his parent. The patient is single and has no children.  He used to work in Architect and concrete work.  He has a history for smoking for around 45 years and unfortunately continues to smoke few cigarettes every day.  He has no history of alcohol abuse but has history as well as current cocaine use. HPI  Past Medical History:  Diagnosis Date   CAD (coronary artery disease)    a. 04/2008 Cath: LM nl, LAD 50p, 16m, LCX min irregs, RCA 50p/m, 40d, RPDA 60-70ost; b. 04/2008 CABG x 4: LIMA->LAD, VG->D2, VG->RPDA->RPL.   Cocaine abuse ([place])    a. Quit early 2019.   Ischemic cardiomyopathy    a. LV gram: EF 30-35%, apical/periapical AK.   Tobacco abuse     Past Surgical History:  Procedure Laterality Date   BYPASS GRAFT     CARDIAC SURGERY      No family history on file.  Social History Social History   Tobacco Use   Smoking status: Current Every Day Smoker    Packs/day: 0.30    Types: Cigarettes   Smokeless tobacco: Current  User   Tobacco comment: has smoked for 40+ yrs - at most 1ppd, currently 1ppwk.  Substance Use Topics   Alcohol use: No    Comment: quite > 30 yrs ago.   Drug use: No    Comment: prev used cocaine - quit early 2019.    No Known Allergies  Current Outpatient Medications  Medication Sig Dispense Refill   acetaminophen (TYLENOL) 325 MG tablet  Take 2 tablets (650 mg total) by mouth every 4 (four) hours as needed for mild pain (or temp > 37.5 C (99.5 F)).     oxyCODONE-acetaminophen (PERCOCET/ROXICET) 5-325 MG tablet Take 1 tablet by mouth every 6 (six) hours as needed for severe pain. 20 tablet 0   polyethylene glycol (MIRALAX / GLYCOLAX) 17 g packet Take 17 g by mouth daily. 14 each 0   No current facility-administered medications for this visit.    Review of Systems  Constitutional: positive for anorexia, fatigue and weight loss Eyes: negative Ears, nose, mouth, throat, and face: negative Respiratory: positive for cough and pleurisy/chest pain Cardiovascular: negative Gastrointestinal: negative Genitourinary:negative Integument/breast: negative Hematologic/lymphatic: negative Musculoskeletal:positive for bone pain Neurological: negative Behavioral/Psych: negative Endocrine: negative Allergic/Immunologic: negative  Physical Exam  PZW:CHENI, healthy, no distress, ill looking and malnourished SKIN: skin color, texture, turgor are normal, no rashes or significant lesions HEAD: Normocephalic, No masses, lesions, tenderness or abnormalities EYES: normal, PERRLA, Conjunctiva are pink and non-injected EARS: External ears normal, Canals clear OROPHARYNX:no exudate, no erythema and lips, buccal mucosa, and tongue normal  NECK: supple, no adenopathy, no JVD LYMPH:  no palpable lymphadenopathy, no hepatosplenomegaly LUNGS: clear to auscultation , and palpation HEART: regular rate & rhythm, no murmurs and no gallops ABDOMEN:abdomen soft, non-tender, normal bowel sounds and no masses or organomegaly BACK: Back symmetric, no curvature., No CVA tenderness EXTREMITIES:no joint deformities, effusion, or inflammation, no edema  NEURO: alert & oriented x 3 with fluent speech, no focal motor/sensory deficits  PERFORMANCE STATUS: ECOG 1  LABORATORY DATA: Lab Results  Component Value Date   WBC 10.2 03/25/2019   HGB 12.6 (L)  03/25/2019   HCT 38.3 (L) 03/25/2019   MCV 90.8 03/25/2019   PLT 398 03/25/2019      Chemistry      Component Value Date/Time   NA 137 03/25/2019 0536   K 4.2 03/25/2019 0536   CL 101 03/25/2019 0536   CO2 23 03/25/2019 0536   BUN 12 03/25/2019 0536   CREATININE 0.83 03/25/2019 0536      Component Value Date/Time   CALCIUM 9.1 03/25/2019 0536   ALKPHOS 71 03/25/2019 0536   AST 25 03/25/2019 0536   ALT 12 03/25/2019 0536   BILITOT 0.8 03/25/2019 0536       RADIOGRAPHIC STUDIES: CT ANGIO HEAD W OR WO CONTRAST  Result Date: 03/05/2019 CLINICAL DATA:  Stroke follow-up EXAM: CT ANGIOGRAPHY HEAD AND NECK TECHNIQUE: Multidetector CT imaging of the head and neck was performed using the standard protocol during bolus administration of intravenous contrast. Multiplanar CT image reconstructions and MIPs were obtained to evaluate the vascular anatomy. Carotid stenosis measurements (when applicable) are obtained utilizing NASCET criteria, using the distal internal carotid diameter as the denominator. CONTRAST:  58mL OMNIPAQUE IOHEXOL 350 MG/ML SOLN COMPARISON:  None. FINDINGS: CTA NECK FINDINGS SKELETON: There is no bony spinal canal stenosis. No lytic or blastic lesion. OTHER NECK: Normal pharynx, larynx and major salivary glands. No cervical lymphadenopathy. Unremarkable thyroid gland. UPPER CHEST: No pneumothorax or pleural effusion. No nodules or masses. AORTIC  ARCH: There is no calcific atherosclerosis of the aortic arch. There is no aneurysm, dissection or hemodynamically significant stenosis of the visualized portion of the aorta. Conventional 3 vessel aortic branching pattern. The visualized proximal subclavian arteries are widely patent. RIGHT CAROTID SYSTEM: No dissection, occlusion or aneurysm. Mild atherosclerotic calcification at the carotid bifurcation without hemodynamically significant stenosis. LEFT CAROTID SYSTEM: Normal without aneurysm, dissection or stenosis. VERTEBRAL ARTERIES:  Left dominant configuration. Both origins are clearly patent. There is no dissection, occlusion or flow-limiting stenosis to the skull base (V1-V3 segments). CTA HEAD FINDINGS POSTERIOR CIRCULATION: --Vertebral arteries: Normal V4 segments. --Posterior inferior cerebellar arteries (PICA): Patent origins from the vertebral arteries. --Anterior inferior cerebellar arteries (AICA): Patent origins from the basilar artery. --Basilar artery: Normal. --Superior cerebellar arteries: Normal. --Posterior cerebral arteries: Normal. Both originate from the basilar artery. Posterior communicating arteries (p-comm) are diminutive or absent. ANTERIOR CIRCULATION: --Intracranial internal carotid arteries: There are severe stenoses of the cavernous segments of both internal carotid arteries due to calcific atherosclerosis. --Anterior cerebral arteries (ACA): Normal. Both A1 segments are present. Patent anterior communicating artery (a-comm). --Middle cerebral arteries (MCA): There is occlusion of the mid M1 segment of the right middle cerebral artery. The M2 branches are patent. VENOUS SINUSES: As permitted by contrast timing, patent. ANATOMIC VARIANTS: None Review of the MIP images confirms the above findings. IMPRESSION: 1. 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 2. 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 These results were communicated to Dr. Roland Rack at 12:31 am on 03/05/2019 by text page via the [facility] messaging system. Electronically Signed   By: Ulyses Jarred M.D.   On: 03/05/2019 00:33   DG Abd 1 View  Result Date: 03/14/2019 CLINICAL DATA:  Vomiting. EXAM: ABDOMEN - 1 VIEW COMPARISON:  None. FINDINGS: No abnormal bowel dilatation is noted. Large amount of stool seen throughout the colon. No radio-opaque calculi or other significant radiographic abnormality are seen. IMPRESSION: Large stool burden.  No  abnormal bowel dilatation. Electronically Signed   By: Marijo Conception M.D.   On: 03/14/2019 11:51   CT ANGIO NECK W OR WO CONTRAST  Result Date: 03/05/2019 CLINICAL DATA:  Stroke follow-up EXAM: CT ANGIOGRAPHY HEAD AND NECK TECHNIQUE: Multidetector CT imaging of the head and neck was performed using the standard protocol during bolus administration of intravenous contrast. Multiplanar CT image reconstructions and MIPs were obtained to evaluate the vascular anatomy. Carotid stenosis measurements (when applicable) are obtained utilizing NASCET criteria, using the distal internal carotid diameter as the denominator. CONTRAST:  19mL OMNIPAQUE IOHEXOL 350 MG/ML SOLN COMPARISON:  None. FINDINGS: CTA NECK FINDINGS SKELETON: There is no bony spinal canal stenosis. No lytic or blastic lesion. OTHER NECK: Normal pharynx, larynx and major salivary glands. No cervical lymphadenopathy. Unremarkable thyroid gland. UPPER CHEST: No pneumothorax or pleural effusion. No nodules or masses. AORTIC ARCH: There is no calcific atherosclerosis of the aortic arch. There is no aneurysm, dissection or hemodynamically significant stenosis of the visualized portion of the aorta. Conventional 3 vessel aortic branching pattern. The visualized proximal subclavian arteries are widely patent. RIGHT CAROTID SYSTEM: No dissection, occlusion or aneurysm. Mild atherosclerotic calcification at the carotid bifurcation without hemodynamically significant stenosis. LEFT CAROTID SYSTEM: Normal without aneurysm, dissection or stenosis. VERTEBRAL ARTERIES: Left dominant configuration. Both origins are clearly patent. There is no dissection, occlusion or flow-limiting stenosis to the skull base (V1-V3 segments). CTA HEAD FINDINGS POSTERIOR CIRCULATION: --Vertebral arteries: Normal  V4 segments. --Posterior inferior cerebellar arteries (PICA): Patent origins from the vertebral arteries. --Anterior inferior cerebellar arteries (AICA): Patent origins from the  basilar artery. --Basilar artery: Normal. --Superior cerebellar arteries: Normal. --Posterior cerebral arteries: Normal. Both originate from the basilar artery. Posterior communicating arteries (p-comm) are diminutive or absent. ANTERIOR CIRCULATION: --Intracranial internal carotid arteries: There are severe stenoses of the cavernous segments of both internal carotid arteries due to calcific atherosclerosis. --Anterior cerebral arteries (ACA): Normal. Both A1 segments are present. Patent anterior communicating artery (a-comm). --Middle cerebral arteries (MCA): There is occlusion of the mid M1 segment of the right middle cerebral artery. The M2 branches are patent. VENOUS SINUSES: As permitted by contrast timing, patent. ANATOMIC VARIANTS: None Review of the MIP images confirms the above findings. IMPRESSION: 1. 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 2. 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 These results were communicated to Dr. Roland Rack at 12:31 am on 03/05/2019 by text page via the [facility] messaging system. Electronically Signed   By: Ulyses Jarred M.D.   On: 03/05/2019 00:33   CT CHEST W CONTRAST  Result Date: 03/25/2019 CLINICAL DATA:  Abnormal spleen and abdominal adenopathy on CT abdomen. Concern for bronchogenic carcinoma EXAM: CT CHEST WITH CONTRAST TECHNIQUE: Multidetector CT imaging of the chest was performed during intravenous contrast administration. CONTRAST:  64mL OMNIPAQUE IOHEXOL 300 MG/ML  SOLN COMPARISON:  CT abdomen 03/24/2019. FINDINGS: Cardiovascular: Post CABG anatomy. Mediastinum/Nodes: No axillary adenopathy. No mediastinal adenopathy. Bulky hilar adenopathy/mass on the RIGHT. Nodal masses measure 3.8 and 3.5 cm (image 92/3. Rounded peribronchial thickening in the RIGHT upper lobe measures 2.6 cm (image 85/3. There is collapse of the lateral segment of the RIGHT middle lobe.  Small lingular nodule measuring 5 mm (image 76/4). Subpleural nodule LEFT upper lobe measuring 5 mm on image 67/4. Lungs/Pleura: Upper Abdomen: Massively enlarged spleen as described on comparison abdominal CT. Adrenal glands not well imaged. Musculoskeletal: No aggressive osseous lesion IMPRESSION: 1. Bulky RIGHT hilar adenopathy and central perihilar mass. Mass involves the RIGHT upper lobe and RIGHT middle lobe lobe. Atelectasis of the RIGHT middle lobe. Findings most suggestive bronchogenic carcinoma. 2. Massively enlarged spleen consistent with malignancy as described on comparison abdominal CT. 3. No axillary or supraclavicular adenopathy. 4. Small LEFT upper lobe nodules.  Recommend attention on follow-up. Electronically Signed   By: Suzy Bouchard M.D.   On: 03/25/2019 12:14   MR Brain Wo Contrast (neuro protocol)  Result Date: 03/04/2019 CLINICAL DATA:  Ataxia EXAM: MRI HEAD WITHOUT CONTRAST TECHNIQUE: Multiplanar, multiecho pulse sequences of the brain and surrounding structures were obtained without intravenous contrast. COMPARISON:  None. FINDINGS: Brain: There is multifocal acute ischemia within the right hemisphere, scattered within the right MCA territory with the largest lesions in the right frontal lobe and anterior right temporal lobe. No acute hemorrhage. No midline shift or other mass effect. Brain volume is normal. No hydrocephalus. There is mild cytotoxic edema associated with the ischemic sites but the brain parenchyma is otherwise normal. No chronic microhemorrhage. The midline structures are normal. Vascular: Flow voids are preserved. Skull and upper cervical spine: Normal bone marrow signal. Visualized extracranial soft tissues are normal. Sinuses/Orbits: Orbits are normal. No mastoid or middle ear effusion. Paranasal sinuses are clear. Other: None IMPRESSION: Multifocal acute ischemia within the right MCA territory. No hemorrhage or mass effect. Electronically Signed   By: Ulyses Jarred M.D.   On: 03/04/2019 19:11  CT ABDOMEN PELVIS W CONTRAST  Result Date: 03/24/2019 CLINICAL DATA:  Left lower quadrant abdominal pain. Painful left inguinal mass. EXAM: CT ABDOMEN AND PELVIS WITH CONTRAST TECHNIQUE: Multidetector CT imaging of the abdomen and pelvis was performed using the standard protocol following bolus administration of intravenous contrast. CONTRAST:  133mL OMNIPAQUE IOHEXOL 300 MG/ML  SOLN COMPARISON:  03/04/2019 chest radiograph. FINDINGS: Lower chest: Incompletely visualized large masslike focus of consolidation at the central right lung base involving right lower and right middle lobes measuring at least 8.0 x 6.1 cm (series 3/image 1). Separate 1.1 cm solid basilar right lower lobe pulmonary nodule (series 4/image 6). Coronary atherosclerosis. Hepatobiliary: Normal liver size. Subcentimeter hypodense segment 7 right liver lesion (series 3/image 20), too small to characterize. No additional liver lesions. Normal gallbladder with no radiopaque cholelithiasis. No biliary ductal dilatation. Pancreas: No pancreatic mass. Borderline prominent main pancreatic duct (3 mm diameter). Spleen: Enlarged markedly abnormal spleen (15 cm craniocaudal length) with markedly heterogeneous soft tissue density. Adrenals/Urinary Tract: No discrete adrenal nodules. Normal kidneys with no hydronephrosis and no renal mass. Normal bladder. Stomach/Bowel: Normal non-distended stomach. Normal caliber small bowel with no small bowel wall thickening. Appendix not discretely visualized. No pericecal inflammatory changes. Normal large bowel with no diverticulosis, large bowel wall thickening or pericolonic fat stranding. Vascular/Lymphatic: Normal caliber abdominal aorta. Patent portal, splenic, hepatic and renal veins. Enlarged centrally necrotic left inguinal lymph nodes, largest 2.6 cm short axis diameter (series 3/image 71). Enlarged centrally necrotic 2.5 cm left external iliac lymph node (series  3/image 63). Enlarged centrally necrotic 2.3 cm left common iliac node (series 3/image 56). Enlarged centrally necrotic left para-aortic lymph nodes up to 2.9 cm short axis diameter (series 3/image 35). Reproductive: Top-normal size prostate. Other: No pneumoperitoneum. No focal fluid collections. Small volume pelvic free fluid. Musculoskeletal: No aggressive appearing focal osseous lesions. Mild-to-moderate thoracolumbar spondylosis. IMPRESSION: 1. Incompletely imaged large 8 cm masslike focus of consolidation in the central right lung, cannot exclude primary bronchogenic carcinoma. Separate 1.1 cm right lung base solid pulmonary nodule. Dedicated chest CT with IV contrast recommended for further characterization. 2. Markedly abnormal enlarged spleen with heterogeneous soft tissue attenuation, nonspecific, suspicious for metastatic disease. 3. Centrally necrotic lymphadenopathy in the left para-aortic, left common iliac, left external iliac and left inguinal chains, suspicious for metastatic disease. The left inguinal adenopathy should be amenable to percutaneous biopsy. 4. Subcentimeter low-attenuation segment 7 right liver lesion, too small to characterize, for which follow-up imaging is advised. 5. Small volume pelvic free fluid. Electronically Signed   By: Ilona Sorrel M.D.   On: 03/24/2019 17:20   CT CEREBRAL PERFUSION W CONTRAST  Result Date: 03/05/2019 CLINICAL DATA:  Stroke follow-up. History of cocaine abuse. EXAM: CT PERFUSION BRAIN TECHNIQUE: Multiphase CT imaging of the brain was performed following IV bolus contrast injection. Subsequent parametric perfusion maps were calculated using RAPID software. CONTRAST:  15mL OMNIPAQUE IOHEXOL 350 MG/ML SOLN COMPARISON:  None. FINDINGS: CT Brain Perfusion Findings: CBF (<30%) Volume: 98mL Perfusion (Tmax>6.0s) volume: 81mL. T-max is elevated throughout much of the right MCA territory. Using a Tmax threshold of 4 seconds instead of 6 seconds demonstrates a  larger region of ischemia measuring 100 mL. Mismatch Volume: 52mL Infarct Core: 0 mL Infarction Location: CT perfusion parameters do not demonstrated core infarct. However, earlier brain MRI shows abnormal diffusion restriction at multiple locations in the right MCA territory. No recent non-contrast head CT is available for determining ASPECTS. IMPRESSION: 1. No core infarct identified by CT perfusion parameters.  However, earlier brain MRI shows abnormal diffusion restriction at multiple locations in the right MCA territory. 2. 7 mL region within the right frontal operculum meeting parameters for ischemic penumbra. Using a Tmax threshold of 4 seconds instead of 6 seconds demonstrates a larger region of ischemia measuring 100 mL. Electronically Signed   By: Ulyses Jarred M.D.   On: 03/05/2019 01:47   DG Chest Port 1 View  Result Date: 03/04/2019 CLINICAL DATA:  Weakness, fall. EXAM: PORTABLE CHEST 1 VIEW COMPARISON:  April 18, 2017. FINDINGS: The heart size and mediastinal contours are within normal limits. Status post coronary bypass graft. No pneumothorax or pleural effusion is noted. Left lung is clear. Large rounded density is noted in right lower lobe concerning for possible neoplasm. The visualized skeletal structures are unremarkable. IMPRESSION: Large rounded density seen in right lower lobe concerning for possible neoplasm. CT scan of the chest is recommended for further evaluation. Electronically Signed   By: Marijo Conception M.D.   On: 03/04/2019 15:49   ECHOCARDIOGRAM COMPLETE  Result Date: 03/05/2019   ECHOCARDIOGRAM REPORT   Patient Name:   [name] Date of Exam: 03/05/2019 Medical Rec #:  [id number]       Height:       67.0 in Accession #:    [id number]      Weight:       119.1 lb Date of Birth:  Nov 30, 1955       BSA:          1.62 m Patient Age:    64 years        BP:           106/72 mmHg Patient Gender: M               HR:           75 bpm. Exam Location:  Inpatient Procedure: 2D Echo  Indications:    Stroke 434.91 / I163.9  History:        Patient has prior history of Echocardiogram examinations, most                 recent 10/17/2017. CAD; Risk Factors:Current Smoker and                 Dyslipidemia. Polysubstance Abuse.  Sonographer:    Leavy Cella Referring Phys: [place] MOHAMMAD L GARBA IMPRESSIONS  1. EF 40-45% with global hypokinesis. There is akinesis of the apical septum, apical inferior, and apical segments. There is no obvious thrombus present in the LV, but would recommend a repeat contrast study to exclude apical thrombus in the patient with concerns for stroke.  2. Left ventricular ejection fraction, by visual estimation, is 40 to 45%. The left ventricle has mildly decreased function. There is no left ventricular hypertrophy.  3. Apical septal segment, apical inferior segment, and apex are abnormal.  4. The left ventricle demonstrates regional wall motion abnormalities.  5. Global right ventricle has normal systolic function.The right ventricular size is normal. No increase in right ventricular wall thickness.  6. Left atrial size was normal.  7. Right atrial size was normal.  8. The mitral valve is myxomatous. Trivial mitral valve regurgitation.  9. The tricuspid valve is grossly normal. Tricuspid valve regurgitation is trivial. 10. The aortic valve is tricuspid. Aortic valve regurgitation is not visualized. No evidence of aortic valve sclerosis or stenosis. 11. The pulmonic valve was grossly normal. Pulmonic valve regurgitation is not visualized. 12. TR signal is inadequate for  assessing pulmonary artery systolic pressure. 13. The inferior vena cava is normal in size with greater than 50% respiratory variability, suggesting right atrial pressure of 3 mmHg. 14. A prior study was performed on 10/17/2017. 15. Changes from prior study are noted. 16. EF slightly improved ~45% compared with prior. WMA remain unchanged. FINDINGS  Left Ventricle: Left ventricular ejection fraction, by  visual estimation, is 40 to 45%. The left ventricle has mildly decreased function. The left ventricle demonstrates regional wall motion abnormalities. The left ventricular internal cavity size was the left ventricle is normal in size. There is no left ventricular hypertrophy. EF 40-45% with global hypokinesis. There is akinesis of the apical septum, apical inferior, and apical segments. There is no obvious thrombus present in the LV, but would recommend a repeat contrast study to exclude apical thrombus in the patient with concerns for stroke.  LV Wall Scoring: The apical septal segment, apical inferior segment, and apex are akinetic. Right Ventricle: The right ventricular size is normal. No increase in right ventricular wall thickness. Global RV systolic function is has normal systolic function. Left Atrium: Left atrial size was normal in size. Right Atrium: Right atrial size was normal in size Pericardium: There is no evidence of pericardial effusion. 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 Tricuspid Valve: The tricuspid valve is grossly normal. Tricuspid valve regurgitation is trivial. Aortic Valve: The aortic valve is tricuspid. Aortic valve regurgitation is not visualized. The aortic valve is structurally normal, with no evidence of sclerosis or stenosis. Pulmonic Valve: The pulmonic valve was grossly normal. Pulmonic valve regurgitation is not visualized. Pulmonic regurgitation is not visualized. Aorta: The aortic root is normal in size and structure. Venous: The inferior vena cava is normal in size with greater than 50% respiratory variability, suggesting right atrial pressure of 3 mmHg. IAS/Shunts: No atrial level shunt detected by color flow Doppler. Additional Comments: A prior study was performed on 10/17/2017.  LEFT VENTRICLE PLAX 2D LVIDd:         4.50 cm  Diastology LVIDs:         3.40 cm  LV e' lateral:   8.95 cm/s LV PW:         1.20 cm  LV E/e' lateral: 6.4  LV IVS:        1.10 cm  LV e' medial:    7.09 cm/s LVOT diam:     2.20 cm  LV E/e' medial:  8.0 LV SV:         45 ml LV SV Index:   28.32 LVOT Area:     3.80 cm  RIGHT VENTRICLE RV S prime:     14.40 cm/s TAPSE (M-mode): 1.5 cm LEFT ATRIUM           Index       RIGHT ATRIUM           Index LA diam:      3.20 cm 1.97 cm/m  RA Area:     11.40 cm LA Vol (A2C): 31.0 ml 19.11 ml/m RA Volume:   24.90 ml  15.35 ml/m LA Vol (A4C): 44.3 ml 27.30 ml/m   AORTA Ao Root diam: 3.50 cm MITRAL VALVE MV Area (PHT): 5.42 cm             SHUNTS MV PHT:        40.60 msec           Systemic Diam: 2.20 cm MV Decel Time: 140 msec MV E velocity:  57.00 cm/s 103 cm/s MV A velocity: 91.70 cm/s 70.3 cm/s MV E/A ratio:  0.62       1.5  Eleonore Chiquito MD Electronically signed by Eleonore Chiquito MD Signature Date/Time: 03/05/2019/2:53:46 PM    Final    [place] LT LOWER EXTREM LTD SOFT TISSUE NON VASCULAR  Result Date: 03/14/2019 CLINICAL DATA:  Palpable lump over the left inguinal region. EXAM: ULTRASOUND LEFT LOWER EXTREMITY LIMITED TECHNIQUE: Ultrasound examination of the lower extremity soft tissues was performed in the area of clinical concern. COMPARISON:  None. FINDINGS: Joint Space: Not evaluated. Muscles: Normal. Tendons: Not evaluated. Other Soft Tissue Structures: Ultrasound examination over patient's palpable abnormality in the left inguinal region demonstrates an oval homogeneously hypoechoic mass just below the skin measuring 1.4 x 2.2 x 2.4 cm. This has increased through transmission. There is a normal adjacent 9 mm lymph node. There is no communication with the underlying femoral artery and vein. This likely represents a abnormal inguinal lymph node although a complex fluid collection such as subcutaneous abscess or hematoma could have a similar appearance. There is internal vascularity favoring a solid mass such as abnormal lymph node. IMPRESSION: Oval hypoechoic mass just below the skin over the left inguinal region  corresponding to patient's palpable abnormality. This could represent an abnormal lymph node versus complex fluid collection such as hematoma or subcutaneous abscess. Recommend clinical correlation. Electronically Signed   By: Marin Olp M.D.   On: 03/14/2019 12:46   [place] CORE BIOPSY (LYMPH NODES)  Result Date: 03/26/2019 INDICATION: Left inguinal metastatic adenopathy, no known primary EXAM: ULTRASOUND GUIDED CORE BIOPSY OF LEFT INGUINAL METASTATIC ADENOPATHY MEDICATIONS: 1% LIDOCAINE LOCAL ANESTHESIA/SEDATION: Versed 1.5mg  IV; Fentanyl 58mcg IV; Moderate Sedation Time:  10 MINUTES The patient was continuously monitored during the procedure by the interventional radiology nurse under my direct supervision. FLUOROSCOPY TIME:  Fluoroscopy Time: None. COMPLICATIONS: None immediate. PROCEDURE: The procedure, risks, benefits, and alternatives were explained to the patient. Questions regarding the procedure were encouraged and answered. The patient understands and consents to the procedure. The left inguinal area was prepped with ChloraPrep in a sterile fashion, and a sterile drape was applied covering the operative field. A sterile gown and sterile gloves were used for the procedure. Local anesthesia was provided with 1% Lidocaine. Previous imaging reviewed. Preliminary ultrasound performed. The left inguinal adenopathy was localized and marked. Under sterile conditions and local anesthesia, 18 gauge core biopsy was advanced into the left inguinal adenopathy. Several 18 gauge core biopsies obtained. These were intact and placed in saline. Postprocedure imaging demonstrates no hemorrhage or hematoma. Patient tolerated the biopsy well. FINDINGS: Imaging confirms needle placed in the left inguinal adenopathy for core biopsy IMPRESSION: Successful ultrasound left inguinal adenopathy 18 gauge core biopsies Electronically Signed   By: Jerilynn Mages.  Shick M.D.   On: 03/26/2019 14:23   ECHOCARDIOGRAM LIMITED  Result Date:  03/06/2019   ECHOCARDIOGRAM LIMITED REPORT   Patient Name:   [name] Date of Exam: 03/06/2019 Medical Rec #:  [id number]       Height:       67.0 in Accession #:    [id number]      Weight:       119.1 lb Date of Birth:  Jul 08, 1955       BSA:          1.62 m Patient Age:    62 years        BP:           111/79 mmHg  Patient Gender: M               HR:           74 bpm. Exam Location:  Inpatient  Procedure: Limited Echo and Intracardiac Opacification Agent Indications:    Stroke 434.91 / I163.9  History:        Patient has prior history of Echocardiogram examinations, most                 recent 03/05/2019. Cocaine abuse.  Sonographer:    Tiffany Dance Referring Phys: [id number] [place]  1. Left ventricular ejection fraction, by visual estimation, is 35 to 40%. The left ventricle has moderately decreased function. There is no left ventricular hypertrophy.  2. The left ventricle demonstrates regional wall motion abnormalities.  3. Global right ventricle has normal systolic function.The right ventricular size is normal.  4. Left atrial size was not assessed.  5. Right atrial size was normal.  6. Moderate pleural effusion.  7. The mitral valve was not assessed.  8. The tricuspid valve is not assessed.  9. The aortic valve was not assessed. 10. The pulmonic valve was not assessed. 11. Aortic root could not be assessed. 12. Limited study; no doppler performed; apical akinesis with moderate LV dysfunction; using definity, no obvious thrombus noted. FINDINGS  Left Ventricle: Left ventricular ejection fraction, by visual estimation, is 35 to 40%. The left ventricle has moderately decreased function. The left ventricle demonstrates regional wall motion abnormalities. Right Ventricle: The right ventricular size is normal. Global RV systolic function is has normal systolic function. Left Atrium: Left atrial size was not assessed. Right Atrium: Right atrial size was normal in size Pericardium: There is no  evidence of pericardial effusion. There is a moderate pleural effusion in either the left or right lateral region. Mitral Valve: The mitral valve was not assessed. Tricuspid Valve: The tricuspid valve is not assessed. Aortic Valve: The aortic valve was not assessed. Pulmonic Valve: The pulmonic valve was not assessed. Aorta: Aortic root could not be assessed.  Additional Comments: Limited study; no doppler performed; apical akinesis with moderate LV dysfunction; using definity, no obvious thrombus noted.  Kirk Ruths MD Electronically signed by Kirk Ruths MD Signature Date/Time: 03/06/2019/3:22:00 PM    Final     ASSESSMENT: This is a very pleasant 64 years old African-American male with highly suspicious malignancy concerning for primary bronchogenic carcinoma versus lymphoma.  He presented with bulky right hilar and central perihilar mass involving the right upper lobe and right middle lobe with massive enlargement of the spleen as well as lymphadenopathy as well as central necrotic lymphadenopathy in the left para-aortic, left common iliac, left external iliac and left inguinal chain suspicious for metastatic disease.  Tissue biopsy from the left inguinal lymph node showed necrotic tissue with no evidence of malignancy.   PLAN: I had a lengthy discussion with the patient today about his current condition and further investigation to confirm her diagnosis. I recommended for the patient to have a PET scan for further evaluation of his disease and also to choose the right side for the biopsy. Once the PET scan is performed, I will either refer the patient to pulmonary/cardiothoracic surgery for bronchoscopy with endobronchial ultrasound and biopsy of the right hilar mass versus ultrasound or CT-guided core biopsy of one of the accessible lymph node. I will see the patient back for follow-up visit in around 10-14 days for evaluation and discussion of his treatment options based on the  final staging  work-up as well as the biopsy results. The patient has a lot of social issues and we will refer him to the social worker at the cancer center for evaluation and help with his social status. The patient was advised to call immediately if he has any concerning symptoms in the interval.  The patient voices understanding of current disease status and treatment options and is in agreement with the current care plan.  All questions were answered. The patient knows to call the clinic with any problems, questions or concerns. We can certainly see the patient much sooner if necessary.  Thank you so much for allowing me to participate in the care of [name]. I will continue to follow up the patient with you and assist in his care.  I spent 50 minutes counseling the patient face to face. The total time spent in the appointment was 70 minutes.  Disclaimer: This note was dictated with voice recognition software. Similar sounding words can inadvertently be transcribed and may not be corrected upon review.   Eilleen Kempf April 03, 2019, 2:59 PM

## 2019-04-03 NOTE — Progress Notes (Signed)
Referral made to Lauren/SW.  Pt living in hotel.  Limited resources. States no real family other nieces/nephews.  Has some friends.  Someone brought to appt today.

## 2019-04-04 ENCOUNTER — Encounter (HOSPITAL_COMMUNITY): Payer: Self-pay | Admitting: Emergency Medicine

## 2019-04-04 DIAGNOSIS — R1032 Left lower quadrant pain: Secondary | ICD-10-CM | POA: Diagnosis present

## 2019-04-04 DIAGNOSIS — Z951 Presence of aortocoronary bypass graft: Secondary | ICD-10-CM | POA: Insufficient documentation

## 2019-04-04 DIAGNOSIS — R161 Splenomegaly, not elsewhere classified: Secondary | ICD-10-CM | POA: Diagnosis not present

## 2019-04-04 DIAGNOSIS — K5900 Constipation, unspecified: Secondary | ICD-10-CM | POA: Insufficient documentation

## 2019-04-04 DIAGNOSIS — R590 Localized enlarged lymph nodes: Secondary | ICD-10-CM | POA: Diagnosis not present

## 2019-04-04 DIAGNOSIS — I110 Hypertensive heart disease with heart failure: Secondary | ICD-10-CM | POA: Insufficient documentation

## 2019-04-04 DIAGNOSIS — I2510 Atherosclerotic heart disease of native coronary artery without angina pectoris: Secondary | ICD-10-CM | POA: Insufficient documentation

## 2019-04-04 DIAGNOSIS — R918 Other nonspecific abnormal finding of lung field: Secondary | ICD-10-CM | POA: Diagnosis not present

## 2019-04-04 DIAGNOSIS — I5042 Chronic combined systolic (congestive) and diastolic (congestive) heart failure: Secondary | ICD-10-CM | POA: Diagnosis not present

## 2019-04-04 DIAGNOSIS — F17210 Nicotine dependence, cigarettes, uncomplicated: Secondary | ICD-10-CM | POA: Diagnosis not present

## 2019-04-04 DIAGNOSIS — Z79899 Other long term (current) drug therapy: Secondary | ICD-10-CM | POA: Diagnosis not present

## 2019-04-04 LAB — COMPREHENSIVE METABOLIC PANEL
ALT: 11 U/L (ref 0–44)
AST: 15 U/L (ref 15–41)
Albumin: 2.8 g/dL — ABNORMAL LOW (ref 3.5–5.0)
Alkaline Phosphatase: 65 U/L (ref 38–126)
Anion gap: 9 (ref 5–15)
BUN: 19 mg/dL (ref 8–23)
CO2: 27 mmol/L (ref 22–32)
Calcium: 9.3 mg/dL (ref 8.9–10.3)
Chloride: 108 mmol/L (ref 98–111)
Creatinine, Ser: 0.81 mg/dL (ref 0.61–1.24)
GFR calc Af Amer: >60 mL/min (ref >60)
GFR calc non Af Amer: >60 mL/min (ref >60)
Glucose, Bld: 91 mg/dL (ref 70–99)
Potassium: 4 mmol/L (ref 3.5–5.1)
Sodium: 144 mmol/L (ref 135–145)
Total Bilirubin: 0.5 mg/dL (ref 0.3–1.2)
Total Protein: 7.4 g/dL (ref 6.5–8.1)

## 2019-04-04 LAB — CBC
HCT: 35.4 % — ABNORMAL LOW (ref 39.0–52.0)
Hemoglobin: 11.2 g/dL — ABNORMAL LOW (ref 13.0–17.0)
MCH: 29 pg (ref 26.0–34.0)
MCHC: 31.6 g/dL (ref 30.0–36.0)
MCV: 91.7 fL (ref 80.0–100.0)
Platelets: 666 10*3/uL — ABNORMAL HIGH (ref 150–400)
RBC: 3.86 MIL/uL — ABNORMAL LOW (ref 4.22–5.81)
RDW: 15 % (ref 11.5–15.5)
WBC: 8.3 10*3/uL (ref 4.0–10.5)
nRBC: 0 % (ref 0.0–0.2)

## 2019-04-04 LAB — LIPASE, BLOOD: Lipase: 18 U/L (ref 11–51)

## 2019-04-04 NOTE — ED Triage Notes (Signed)
Per EMS, patient from home, c/o sharp left abdominal pain for "a while" worsening today x6 hours. Denies N/V/D. States last BM x5 days ago. Hx stroke with left side deficits. Ambulatory with assistance.

## 2019-04-05 ENCOUNTER — Other Ambulatory Visit: Payer: Self-pay

## 2019-04-05 ENCOUNTER — Emergency Department (HOSPITAL_COMMUNITY)
Admission: EM | Admit: 2019-04-05 | Discharge: 2019-04-05 | Disposition: A | Discharge status: Home / Self Care | Payer: Medicaid Other | Source: Home / Self Care | Attending: Emergency Medicine | Admitting: Emergency Medicine

## 2019-04-05 ENCOUNTER — Emergency Department (HOSPITAL_COMMUNITY): Payer: Medicaid Other

## 2019-04-05 ENCOUNTER — Encounter (HOSPITAL_COMMUNITY): Payer: Self-pay | Admitting: Emergency Medicine

## 2019-04-05 ENCOUNTER — Emergency Department (HOSPITAL_COMMUNITY)
Admission: EM | Admit: 2019-04-05 | Discharge: 2019-04-05 | Disposition: A | Discharge status: Home / Self Care | Payer: Medicaid Other | Attending: Emergency Medicine | Admitting: Emergency Medicine

## 2019-04-05 DIAGNOSIS — F17210 Nicotine dependence, cigarettes, uncomplicated: Secondary | ICD-10-CM | POA: Insufficient documentation

## 2019-04-05 DIAGNOSIS — I2510 Atherosclerotic heart disease of native coronary artery without angina pectoris: Secondary | ICD-10-CM | POA: Insufficient documentation

## 2019-04-05 DIAGNOSIS — R918 Other nonspecific abnormal finding of lung field: Secondary | ICD-10-CM | POA: Insufficient documentation

## 2019-04-05 DIAGNOSIS — I110 Hypertensive heart disease with heart failure: Secondary | ICD-10-CM | POA: Insufficient documentation

## 2019-04-05 DIAGNOSIS — K5900 Constipation, unspecified: Secondary | ICD-10-CM

## 2019-04-05 DIAGNOSIS — Z951 Presence of aortocoronary bypass graft: Secondary | ICD-10-CM | POA: Insufficient documentation

## 2019-04-05 DIAGNOSIS — R591 Generalized enlarged lymph nodes: Secondary | ICD-10-CM

## 2019-04-05 DIAGNOSIS — I5042 Chronic combined systolic (congestive) and diastolic (congestive) heart failure: Secondary | ICD-10-CM | POA: Insufficient documentation

## 2019-04-05 DIAGNOSIS — R161 Splenomegaly, not elsewhere classified: Secondary | ICD-10-CM

## 2019-04-05 LAB — URINALYSIS, ROUTINE W REFLEX MICROSCOPIC
Bacteria, UA: NONE SEEN
Bilirubin Urine: NEGATIVE
Glucose, UA: 50 mg/dL — AB
Hgb urine dipstick: NEGATIVE
Ketones, ur: NEGATIVE mg/dL
Leukocytes,Ua: NEGATIVE
Nitrite: NEGATIVE
Protein, ur: 30 mg/dL — AB
Specific Gravity, Urine: 1.029 (ref 1.005–1.030)
pH: 5 (ref 5.0–8.0)

## 2019-04-05 MED ORDER — ACETAMINOPHEN 500 MG PO TABS
1000.0000 mg | ORAL_TABLET | Freq: Once | ORAL | Status: AC
  Administered 2019-04-05: 02:00:00 1000 mg via ORAL
  Filled 2019-04-05: qty 2

## 2019-04-05 MED ORDER — DOCUSATE SODIUM 100 MG PO CAPS: 100.0000 mg | ORAL_CAPSULE | Freq: Two times a day (BID) | ORAL | 0 refills | Status: DC

## 2019-04-05 MED ORDER — ACETAMINOPHEN 325 MG PO TABS
650.0000 mg | ORAL_TABLET | Freq: Once | ORAL | Status: AC
  Administered 2019-04-05: 650 mg via ORAL
  Filled 2019-04-05: qty 2

## 2019-04-05 MED ORDER — POLYETHYLENE GLYCOL 3350 17 G PO PACK: 17.0000 g | PACK | Freq: Every day | ORAL | 0 refills | Status: DC

## 2019-04-05 MED ORDER — IBUPROFEN 800 MG PO TABS
800.0000 mg | ORAL_TABLET | Freq: Once | ORAL | Status: AC
  Administered 2019-04-05: 02:00:00 800 mg via ORAL
  Filled 2019-04-05: qty 1

## 2019-04-05 NOTE — ED Triage Notes (Addendum)
Pt still c/o pain on left side where has masses. Pt was seen earlier today for same complaint and was sitting in lobby until security spoke to him about leaving due to can't have non-patients sitting in lobby. That is when patient checked back in for the pains.

## 2019-04-05 NOTE — ED Notes (Signed)
Pt refused discharge vitals. Pt A/Ox4 and ambulatory upon discharge.

## 2019-04-05 NOTE — Discharge Instructions (Signed)
Follow up with your oncologist and primary care provider. Return to ED for chest pain, shortness of breath, blurry vision, numbness in arms or legs.

## 2019-04-05 NOTE — ED Provider Notes (Signed)
[facility] DEPT Provider Note   CSN: [id number] Arrival date & time: 04/05/19  0847     History Chief Complaint  Patient presents with   Recurrent Skin Infections    [name] is a 64 y.o. male with a past medical history of CAD presenting to the ED for ongoing left-sided pain.  He was seen and evaluated this morning for similar symptoms.  He was treated with Tylenol and ibuprofen, given a prescription for stool softeners for his constipation.  He states that "I am back because they made me sit out there in the cold and were not treating the right."  States that the pain feels the same as before.  He has not yet picked up the prescriptions that were given to him this mor ning.  He has not taken anything else for pain.  He is requesting coffee to "warm me up inside."  Denies any chest pain, shortness of breath or vomiting.  Of note, he has a known hilar mass, splenomegaly and lymphadenopathy seen on prior CTs for which he has not yet followed up with oncology about.  HPI     Past Medical History:  Diagnosis Date   CAD (coronary artery disease)    a. 04/2008 Cath: LM nl, LAD 50p, 23m, LCX min irregs, RCA 50p/m, 40d, RPDA 60-70ost; b. 04/2008 CABG x 4: LIMA->LAD, VG->D2, VG->RPDA->RPL.   Cocaine abuse ([place])    a. Quit early 2019.   Ischemic cardiomyopathy    a. LV gram: EF 30-35%, apical/periapical AK.   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  Social History   Tobacco Use   Smoking status: Current Every Day Smoker    Packs/day: 0.30    Types: Cigarettes   Smokeless tobacco: Current User   Tobacco comment: has smoked for 40+ yrs - at most 1ppd, currently 1ppwk.  Substance Use Topics   Alcohol use: No    Comment: quite > 30 yrs ago.   Drug use: No    Comment: prev used cocaine - quit early 2019.    Home Medications Prior to Admission medications   Medication Sig Start Date End Date Taking? Authorizing Provider  acetaminophen (TYLENOL) 325 MG tablet Take 2 tablets (650 mg total) by mouth every 4 (four) hours as needed for mild pain (or temp > 37.5 C (99.5  F)). Patient not taking: Reported on 04/03/2019 03/27/19   Nita Sells, MD  docusate sodium (COLACE) 100 MG capsule Take 1 capsule (100 mg total) by mouth every 12 (twelve) hours. 04/05/19   Palumbo, April, MD  oxyCODONE-acetaminophen (PERCOCET/ROXICET) 5-325 MG tablet Take 1 tablet by mouth every 6 (six) hours as needed for severe pain. Patient not taking: Reported on 04/03/2019 03/27/19   Nita Sells, MD  polyethylene glycol (MIRALAX / GLYCOLAX) 17 g packet Take 17 g by mouth daily. Patient not taking: Reported on 04/03/2019 03/28/19   Nita Sells, MD  polyethylene glycol (MIRALAX) 17 g packet Take 17 g by mouth daily. 04/05/19   Palumbo, April, MD    Allergies    Patient has no known allergies.  Review of Systems     Review of Systems  Constitutional: Negative for chills and fever.  Respiratory: Negative for shortness of breath.   Cardiovascular: Negative for chest pain.  Musculoskeletal: Positive for myalgias.    Physical Exam Updated Vital Signs BP 97/68 (BP Location: Left Arm)    Pulse 60    Temp 97.7 F (36.5 C) (Oral)    Resp 17    SpO2 100%   Physical Exam Vitals and nursing note reviewed.  Constitutional:      General: He is not in acute distress.    Appearance: He is well-developed. He is not diaphoretic.  HENT:     Head: Normocephalic and atraumatic.  Eyes:     General: No scleral icterus.    Conjunctiva/sclera: Conjunctivae normal.  Cardiovascular:     Rate and Rhythm: Normal rate and regular rhythm.     Heart sounds: Normal heart sounds.  Pulmonary:     Effort: Pulmonary effort is normal. No respiratory distress.  Abdominal:     Tenderness: There is no abdominal tenderness.  Musculoskeletal:     Cervical back: Normal range of motion.  Skin:    Findings: No rash.  Neurological:     Mental Status: He is alert.     ED Results / Procedures / Treatments   Labs (all labs ordered are listed, but only abnormal results are displayed) Labs Reviewed - No data to display  EKG None  Radiology CT Renal Stone Study  Result Date: 04/05/2019 CLINICAL DATA:  Left abdominal pain EXAM: CT ABDOMEN AND PELVIS WITHOUT CONTRAST TECHNIQUE: Multidetector CT imaging of the abdomen and pelvis was performed following the standard protocol without IV contrast. COMPARISON:  March 25, 2019 FINDINGS: Lower chest: Partially visualized large right hilar adenopathy and perihilar mass. There is a unchanged 1.5 cm pulmonary nodule at the right lung base. There is new streaky/patchy airspace opacity at the left lung base, likely atelectasis. There is unchanged cardiomegaly. Hepatobiliary: Although limited due to the lack of intravenous contrast, normal in appearance without gross focal abnormality. No  evidence of calcified gallstones or biliary ductal dilatation. Pancreas:  Unremarkable.  No surrounding inflammatory changes. Spleen: Heterogeneously enlarged spleen with multiple areas of hypodense lesions, grossly unchanged from prior exam. Adrenals/Urinary Tract: Both adrenal glands appear normal. The kidneys and collecting system appear normal without evidence of urinary tract calculus or hydronephrosis. Bladder is unremarkable. Stomach/Bowel: The stomach, small bowel, and colon nondilated. There is a moderate to large amount of colonic stool present. Vascular/Lymphatic: No definite abdominal adenopathy seen, however limited due to lack of intravenous contrast. There is a 2.1 cm centrally necrotic left inguinal lymph node. Scattered vascular calcifications are seen. Reproductive: The prostate is unremarkable. Other: No evidence of abdominal wall  mass or hernia. Musculoskeletal: No acute or significant osseous findings. IMPRESSION: 1. Partially visualized right perihilar mass with bulky adenopathy. 2. Unchanged 1.5 cm right lung base nodule. 3. New streaky atelectasis at the left lung base. 4. Large unchanged hypodense mass within the spleen. 5. Moderate to large amount of colonic stool without evidence of obstruction. 6. Left inguinal necrotic adenopathy. 7.  Aortic Atherosclerosis (ICD10-I70.0). Electronically Signed   By: Prudencio Pair M.D.   On: 04/05/2019 01:38    Procedures Procedures (including critical care time)  Medications Ordered in ED Medications  acetaminophen (TYLENOL) tablet 650 mg (has no administration in time range)    ED Course  I have reviewed the triage vital signs and the nursing notes.  Pertinent labs & imaging results that were available during my care of the patient were reviewed by me and considered in my medical decision making (see chart for details).    MDM Rules/Calculators/A&P                      64 year old male presents to ED for ongoing left-sided pain.  He  was actually seen and discharged several hours ago but returned due to cold weather outside.  He is requesting coffee.  I had a long discussion with the patient regarding his known hilar mass and adenopathy for which he has not yet followed up with oncology for.  He is mostly concerned that he was told to wait in the cold weather outside.  We will give him another dose of ibuprofen will have him follow-up with oncology and PCP.  I see no need for repeat work-up at this time as his vital signs are reassuring and his symptoms have not changed after being seen several hours ago, and his previous work-up this morning including lab work and imaging was negative for emergent process.  Patient is hemodynamically stable, in NAD, and able to ambulate in the ED. Evaluation does not show pathology that would require ongoing emergent intervention or inpatient treatment. I explained the diagnosis to the patient. Pain has been managed and has no complaints prior to discharge. Patient is comfortable with above plan and is stable for discharge at this time. All questions were answered prior to disposition. Strict return precautions for returning to the ED were discussed. Encouraged follow up with PCP.   An After Visit Summary was printed and given to the patient.   Portions of this note were generated with [employer]. Dictation errors may occur despite best attempts at proofreading.  Final Clinical Impression(s) / ED Diagnoses Final diagnoses:  Hilar mass    Rx / DC Orders ED Discharge Orders    None       Delia Heady, PA-C 04/05/19 1043    Lacretia Leigh, MD 04/06/19 1423

## 2019-04-05 NOTE — ED Provider Notes (Signed)
[facility] DEPT Provider Note   CSN: [id number] Arrival date & time: 04/04/19  1952     History Chief Complaint  Patient presents with   Abdominal Pain    [name] is a 64 y.o. male.  The history is provided by the patient.  Abdominal Pain Pain location: left groin pain. Pain quality: not aching   Pain radiates to:  Does not radiate Pain severity:  Severe Onset quality:  Gradual Timing:  Constant Progression:  Unchanged Chronicity:  New Context: not eating and not laxative use   Relieved by:  Nothing Worsened by:  Nothing Ineffective treatments:  None tried Associated symptoms: no anorexia, no chest pain, no dysuria, no fever, no flatus, no nausea and no shortness of breath   Patient was seen for same last week and told he had a mass in the chest, splenomegaly and groin nodes that were necrotic.  He also had constipation.  He has taken nothing for his symptoms and has not followed up as directed.  No f/c/r.  No urinary symptoms.       Past Medical History:  Diagnosis Date   CAD (coronary artery disease)    a. 04/2008 Cath: LM nl, LAD 50p, 73m, LCX min irregs, RCA 50p/m, 40d, RPDA 60-70ost; b. 04/2008 CABG x 4: LIMA->LAD, VG->D2, VG->RPDA->RPL.   Cocaine abuse ([place])    a. Quit early 2019.   Ischemic cardiomyopathy    a. LV gram: EF 30-35%, apical/periapical AK.   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 No pertinent family history.  Social History   Tobacco Use   Smoking status: Current Every Day Smoker    Packs/day: 0.30    Types: Cigarettes   Smokeless tobacco: Current User   Tobacco comment: has smoked for 40+ yrs - at most 1ppd, currently 1ppwk.  Substance Use Topics   Alcohol use: No    Comment: quite > 30 yrs ago.   Drug use: No    Comment: prev used cocaine - quit early 2019.    Home Medications Prior to Admission medications   Medication Sig Start Date End Date Taking? Authorizing Provider  acetaminophen (TYLENOL) 325 MG tablet Take 2 tablets (650 mg total) by mouth every 4 (four) hours as needed for mild pain (or temp > 37.5 C (99.5 F)). Patient not taking: Reported on 04/03/2019 03/27/19  Nita Sells, MD  docusate sodium (COLACE) 100 MG capsule Take 1 capsule (100 mg total) by mouth every 12 (twelve) hours. 04/05/19   Dene Nazir, MD  oxyCODONE-acetaminophen (PERCOCET/ROXICET) 5-325 MG tablet Take 1 tablet by mouth every 6 (six) hours as needed for severe pain. Patient not taking: Reported on 04/03/2019 03/27/19   Nita Sells, MD  polyethylene glycol (MIRALAX / GLYCOLAX) 17 g packet Take 17 g by mouth daily. Patient not taking: Reported on 04/03/2019 03/28/19   Nita Sells, MD  polyethylene glycol (MIRALAX) 17 g packet Take 17 g by mouth daily. 04/05/19   Mikeria Valin, MD    Allergies    Patient has no known allergies.  Review of Systems   Review  of Systems  Constitutional: Negative for fever.  HENT: Negative for congestion.   Eyes: Negative for visual disturbance.  Respiratory: Negative for shortness of breath.   Cardiovascular: Negative for chest pain.  Gastrointestinal: Positive for abdominal pain. Negative for anorexia, flatus and nausea.  Genitourinary: Negative for dysuria.  Musculoskeletal: Negative for arthralgias.  Neurological: Negative for dizziness.  Psychiatric/Behavioral: Negative for agitation.  All other systems reviewed and are negative.   Physical Exam Updated Vital Signs BP 106/87    Pulse 92    Temp 98.8 F (37.1 C)    Resp 16    SpO2 98%   Physical Exam Vitals and nursing note reviewed.  Constitutional:      Appearance: Normal appearance. He is not diaphoretic.  HENT:     Head: Normocephalic and atraumatic.     Nose: Nose normal.  Eyes:     Conjunctiva/sclera: Conjunctivae normal.     Pupils: Pupils are equal, round, and reactive to light.  Cardiovascular:     Rate and Rhythm: Normal rate and regular rhythm.     Pulses: Normal pulses.     Heart sounds: Normal heart sounds.  Pulmonary:     Effort: Pulmonary effort is normal.     Breath sounds: Normal breath sounds.  Abdominal:     General: Abdomen is flat. Bowel sounds are normal.     Tenderness: There is no abdominal tenderness. There is no guarding or rebound.     Comments: Stool palpable   Musculoskeletal:        General: Normal range of motion.     Cervical back: Normal range of motion and neck supple.  Skin:    General: Skin is warm and dry.     Capillary Refill: Capillary refill takes less than 2 seconds.  Neurological:     General: No focal deficit present.     Mental Status: He is alert and oriented to person, place, and time.  Psychiatric:        Mood and Affect: Mood normal.     ED Results / Procedures / Treatments   Labs (all labs ordered are listed, but only abnormal results are displayed) Results for orders placed or  performed during the hospital encounter of 04/05/19  Lipase, blood  Result Value Ref Range   Lipase 18 11 - 51 U/L  Comprehensive metabolic panel  Result Value Ref Range   Sodium 144 135 - 145 mmol/L   Potassium 4.0 3.5 - 5.1 mmol/L   Chloride 108 98 - 111 mmol/L   CO2 27 22 - 32 mmol/L   Glucose, Bld 91 70 - 99 mg/dL   BUN 19 8 - 23 mg/dL   Creatinine, Ser 0.81 0.61 - 1.24 mg/dL   Calcium 9.3 8.9 - 10.3  mg/dL   Total Protein 7.4 6.5 - 8.1 g/dL   Albumin 2.8 (L) 3.5 - 5.0 g/dL   AST 15 15 - 41 U/L   ALT 11 0 - 44 U/L   Alkaline Phosphatase 65 38 - 126 U/L   Total Bilirubin 0.5 0.3 - 1.2 mg/dL   GFR calc non Af Amer >60 >60 mL/min   GFR calc Af Amer >60 >60 mL/min   Anion gap 9 5 - 15  CBC  Result Value Ref Range   WBC 8.3 4.0 - 10.5 K/uL   RBC 3.86 (L) 4.22 - 5.81 MIL/uL   Hemoglobin 11.2 (L) 13.0 - 17.0 g/dL   HCT 35.4 (L) 39.0 - 52.0 %   MCV 91.7 80.0 - 100.0 fL   MCH 29.0 26.0 - 34.0 pg   MCHC 31.6 30.0 - 36.0 g/dL   RDW 15.0 11.5 - 15.5 %   Platelets 666 (H) 150 - 400 K/uL   nRBC 0.0 0.0 - 0.2 %   DG Abd 1 View  Result Date: 03/14/2019 CLINICAL DATA:  Vomiting. EXAM: ABDOMEN - 1 VIEW COMPARISON:  None. FINDINGS: No abnormal bowel dilatation is noted. Large amount of stool seen throughout the colon. No radio-opaque calculi or other significant radiographic abnormality are seen. IMPRESSION: Large stool burden.  No abnormal bowel dilatation. Electronically Signed   By: Marijo Conception M.D.   On: 03/14/2019 11:51   CT CHEST W CONTRAST  Result Date: 03/25/2019 CLINICAL DATA:  Abnormal spleen and abdominal adenopathy on CT abdomen. Concern for bronchogenic carcinoma EXAM: CT CHEST WITH CONTRAST TECHNIQUE: Multidetector CT imaging of the chest was performed during intravenous contrast administration. CONTRAST:  70mL OMNIPAQUE IOHEXOL 300 MG/ML  SOLN COMPARISON:  CT abdomen 03/24/2019. FINDINGS: Cardiovascular: Post CABG anatomy. Mediastinum/Nodes: No axillary adenopathy. No  mediastinal adenopathy. Bulky hilar adenopathy/mass on the RIGHT. Nodal masses measure 3.8 and 3.5 cm (image 92/3. Rounded peribronchial thickening in the RIGHT upper lobe measures 2.6 cm (image 85/3. There is collapse of the lateral segment of the RIGHT middle lobe. Small lingular nodule measuring 5 mm (image 76/4). Subpleural nodule LEFT upper lobe measuring 5 mm on image 67/4. Lungs/Pleura: Upper Abdomen: Massively enlarged spleen as described on comparison abdominal CT. Adrenal glands not well imaged. Musculoskeletal: No aggressive osseous lesion IMPRESSION: 1. Bulky RIGHT hilar adenopathy and central perihilar mass. Mass involves the RIGHT upper lobe and RIGHT middle lobe lobe. Atelectasis of the RIGHT middle lobe. Findings most suggestive bronchogenic carcinoma. 2. Massively enlarged spleen consistent with malignancy as described on comparison abdominal CT. 3. No axillary or supraclavicular adenopathy. 4. Small LEFT upper lobe nodules.  Recommend attention on follow-up. Electronically Signed   By: Suzy Bouchard M.D.   On: 03/25/2019 12:14   CT ABDOMEN PELVIS W CONTRAST  Result Date: 03/24/2019 CLINICAL DATA:  Left lower quadrant abdominal pain. Painful left inguinal mass. EXAM: CT ABDOMEN AND PELVIS WITH CONTRAST TECHNIQUE: Multidetector CT imaging of the abdomen and pelvis was performed using the standard protocol following bolus administration of intravenous contrast. CONTRAST:  190mL OMNIPAQUE IOHEXOL 300 MG/ML  SOLN COMPARISON:  03/04/2019 chest radiograph. FINDINGS: Lower chest: Incompletely visualized large masslike focus of consolidation at the central right lung base involving right lower and right middle lobes measuring at least 8.0 x 6.1 cm (series 3/image 1). Separate 1.1 cm solid basilar right lower lobe pulmonary nodule (series 4/image 6). Coronary atherosclerosis. Hepatobiliary: Normal liver size. Subcentimeter hypodense segment 7 right liver lesion (series 3/image 20), too small to  characterize. No additional liver lesions. Normal gallbladder with no radiopaque cholelithiasis. No biliary ductal dilatation. Pancreas: No pancreatic mass. Borderline prominent main pancreatic duct (3 mm diameter). Spleen: Enlarged markedly abnormal spleen (15 cm craniocaudal length) with markedly heterogeneous soft tissue density. Adrenals/Urinary Tract: No discrete adrenal nodules. Normal kidneys with no hydronephrosis and no renal mass. Normal bladder. Stomach/Bowel: Normal non-distended stomach. Normal caliber small bowel with no small bowel wall thickening. Appendix not discretely visualized. No pericecal inflammatory changes. Normal large bowel with no diverticulosis, large bowel wall thickening or pericolonic fat stranding. Vascular/Lymphatic: Normal caliber abdominal aorta. Patent portal, splenic, hepatic and renal veins. Enlarged centrally necrotic left inguinal lymph nodes, largest 2.6 cm short axis diameter (series 3/image 71). Enlarged centrally necrotic 2.5 cm left external iliac lymph node (series 3/image 63). Enlarged centrally necrotic 2.3 cm left common iliac node (series 3/image 56). Enlarged centrally necrotic left para-aortic lymph nodes up to 2.9 cm short axis diameter (series 3/image 35). Reproductive: Top-normal size prostate. Other: No pneumoperitoneum. No focal fluid collections. Small volume pelvic free fluid. Musculoskeletal: No aggressive appearing focal osseous lesions. Mild-to-moderate thoracolumbar spondylosis. IMPRESSION: 1. Incompletely imaged large 8 cm masslike focus of consolidation in the central right lung, cannot exclude primary bronchogenic carcinoma. Separate 1.1 cm right lung base solid pulmonary nodule. Dedicated chest CT with IV contrast recommended for further characterization. 2. Markedly abnormal enlarged spleen with heterogeneous soft tissue attenuation, nonspecific, suspicious for metastatic disease. 3. Centrally necrotic lymphadenopathy in the left para-aortic, left  common iliac, left external iliac and left inguinal chains, suspicious for metastatic disease. The left inguinal adenopathy should be amenable to percutaneous biopsy. 4. Subcentimeter low-attenuation segment 7 right liver lesion, too small to characterize, for which follow-up imaging is advised. 5. Small volume pelvic free fluid. Electronically Signed   By: Ilona Sorrel M.D.   On: 03/24/2019 17:20   [place] LT LOWER EXTREM LTD SOFT TISSUE NON VASCULAR  Result Date: 03/14/2019 CLINICAL DATA:  Palpable lump over the left inguinal region. EXAM: ULTRASOUND LEFT LOWER EXTREMITY LIMITED TECHNIQUE: Ultrasound examination of the lower extremity soft tissues was performed in the area of clinical concern. COMPARISON:  None. FINDINGS: Joint Space: Not evaluated. Muscles: Normal. Tendons: Not evaluated. Other Soft Tissue Structures: Ultrasound examination over patient's palpable abnormality in the left inguinal region demonstrates an oval homogeneously hypoechoic mass just below the skin measuring 1.4 x 2.2 x 2.4 cm. This has increased through transmission. There is a normal adjacent 9 mm lymph node. There is no communication with the underlying femoral artery and vein. This likely represents a abnormal inguinal lymph node although a complex fluid collection such as subcutaneous abscess or hematoma could have a similar appearance. There is internal vascularity favoring a solid mass such as abnormal lymph node. IMPRESSION: Oval hypoechoic mass just below the skin over the left inguinal region corresponding to patient's palpable abnormality. This could represent an abnormal lymph node versus complex fluid collection such as hematoma or subcutaneous abscess. Recommend clinical correlation. Electronically Signed   By: Marin Olp M.D.   On: 03/14/2019 12:46   CT Renal Stone Study  Result Date: 04/05/2019 CLINICAL DATA:  Left abdominal pain EXAM: CT ABDOMEN AND PELVIS WITHOUT CONTRAST TECHNIQUE: Multidetector CT imaging of the  abdomen and pelvis was performed following the standard protocol without IV contrast. COMPARISON:  March 25, 2019 FINDINGS: Lower chest: Partially visualized large right hilar adenopathy and perihilar mass. There is a unchanged 1.5 cm pulmonary nodule at the right lung base. There is new streaky/patchy  airspace opacity at the left lung base, likely atelectasis. There is unchanged cardiomegaly. Hepatobiliary: Although limited due to the lack of intravenous contrast, normal in appearance without gross focal abnormality. No evidence of calcified gallstones or biliary ductal dilatation. Pancreas:  Unremarkable.  No surrounding inflammatory changes. Spleen: Heterogeneously enlarged spleen with multiple areas of hypodense lesions, grossly unchanged from prior exam. Adrenals/Urinary Tract: Both adrenal glands appear normal. The kidneys and collecting system appear normal without evidence of urinary tract calculus or hydronephrosis. Bladder is unremarkable. Stomach/Bowel: The stomach, small bowel, and colon nondilated. There is a moderate to large amount of colonic stool present. Vascular/Lymphatic: No definite abdominal adenopathy seen, however limited due to lack of intravenous contrast. There is a 2.1 cm centrally necrotic left inguinal lymph node. Scattered vascular calcifications are seen. Reproductive: The prostate is unremarkable. Other: No evidence of abdominal wall mass or hernia. Musculoskeletal: No acute or significant osseous findings. IMPRESSION: 1. Partially visualized right perihilar mass with bulky adenopathy. 2. Unchanged 1.5 cm right lung base nodule. 3. New streaky atelectasis at the left lung base. 4. Large unchanged hypodense mass within the spleen. 5. Moderate to large amount of colonic stool without evidence of obstruction. 6. Left inguinal necrotic adenopathy. 7.  Aortic Atherosclerosis (ICD10-I70.0). Electronically Signed   By: Prudencio Pair M.D.   On: 04/05/2019 01:38   [place] CORE BIOPSY (LYMPH  NODES)  Result Date: 03/26/2019 INDICATION: Left inguinal metastatic adenopathy, no known primary EXAM: ULTRASOUND GUIDED CORE BIOPSY OF LEFT INGUINAL METASTATIC ADENOPATHY MEDICATIONS: 1% LIDOCAINE LOCAL ANESTHESIA/SEDATION: Versed 1.5mg  IV; Fentanyl 51mcg IV; Moderate Sedation Time:  10 MINUTES The patient was continuously monitored during the procedure by the interventional radiology nurse under my direct supervision. FLUOROSCOPY TIME:  Fluoroscopy Time: None. COMPLICATIONS: None immediate. PROCEDURE: The procedure, risks, benefits, and alternatives were explained to the patient. Questions regarding the procedure were encouraged and answered. The patient understands and consents to the procedure. The left inguinal area was prepped with ChloraPrep in a sterile fashion, and a sterile drape was applied covering the operative field. A sterile gown and sterile gloves were used for the procedure. Local anesthesia was provided with 1% Lidocaine. Previous imaging reviewed. Preliminary ultrasound performed. The left inguinal adenopathy was localized and marked. Under sterile conditions and local anesthesia, 18 gauge core biopsy was advanced into the left inguinal adenopathy. Several 18 gauge core biopsies obtained. These were intact and placed in saline. Postprocedure imaging demonstrates no hemorrhage or hematoma. Patient tolerated the biopsy well. FINDINGS: Imaging confirms needle placed in the left inguinal adenopathy for core biopsy IMPRESSION: Successful ultrasound left inguinal adenopathy 18 gauge core biopsies Electronically Signed   By: Jerilynn Mages.  Shick M.D.   On: 03/26/2019 14:23   ECHOCARDIOGRAM LIMITED  Result Date: 03/06/2019   ECHOCARDIOGRAM LIMITED REPORT   Patient Name:   EARLE TROIANO Date of Exam: 03/06/2019 Medical Rec #:  [id number]       Height:       67.0 in Accession #:    [id number]      Weight:       119.1 lb Date of Birth:  Jun 16, 1955       BSA:          1.62 m Patient Age:    21 years         BP:           111/79 mmHg Patient Gender: M               HR:  74 bpm. Exam Location:  Inpatient  Procedure: Limited Echo and Intracardiac Opacification Agent Indications:    Stroke 434.91 / I163.9  History:        Patient has prior history of Echocardiogram examinations, most                 recent 03/05/2019. Cocaine abuse.  Sonographer:    Tiffany Dance Referring Phys: [id number] [place]  1. Left ventricular ejection fraction, by visual estimation, is 35 to 40%. The left ventricle has moderately decreased function. There is no left ventricular hypertrophy.  2. The left ventricle demonstrates regional wall motion abnormalities.  3. Global right ventricle has normal systolic function.The right ventricular size is normal.  4. Left atrial size was not assessed.  5. Right atrial size was normal.  6. Moderate pleural effusion.  7. The mitral valve was not assessed.  8. The tricuspid valve is not assessed.  9. The aortic valve was not assessed. 10. The pulmonic valve was not assessed. 11. Aortic root could not be assessed. 12. Limited study; no doppler performed; apical akinesis with moderate LV dysfunction; using definity, no obvious thrombus noted. FINDINGS  Left Ventricle: Left ventricular ejection fraction, by visual estimation, is 35 to 40%. The left ventricle has moderately decreased function. The left ventricle demonstrates regional wall motion abnormalities. Right Ventricle: The right ventricular size is normal. Global RV systolic function is has normal systolic function. Left Atrium: Left atrial size was not assessed. Right Atrium: Right atrial size was normal in size Pericardium: There is no evidence of pericardial effusion. There is a moderate pleural effusion in either the left or right lateral region. Mitral Valve: The mitral valve was not assessed. Tricuspid Valve: The tricuspid valve is not assessed. Aortic Valve: The aortic valve was not assessed. Pulmonic Valve: The pulmonic  valve was not assessed. Aorta: Aortic root could not be assessed.  Additional Comments: Limited study; no doppler performed; apical akinesis with moderate LV dysfunction; using definity, no obvious thrombus noted.  Kirk Ruths MD Electronically signed by Kirk Ruths MD Signature Date/Time: 03/06/2019/3:22:00 PM    Final     Radiology CT Renal Stone Study  Result Date: 04/05/2019 CLINICAL DATA:  Left abdominal pain EXAM: CT ABDOMEN AND PELVIS WITHOUT CONTRAST TECHNIQUE: Multidetector CT imaging of the abdomen and pelvis was performed following the standard protocol without IV contrast. COMPARISON:  March 25, 2019 FINDINGS: Lower chest: Partially visualized large right hilar adenopathy and perihilar mass. There is a unchanged 1.5 cm pulmonary nodule at the right lung base. There is new streaky/patchy airspace opacity at the left lung base, likely atelectasis. There is unchanged cardiomegaly. Hepatobiliary: Although limited due to the lack of intravenous contrast, normal in appearance without gross focal abnormality. No evidence of calcified gallstones or biliary ductal dilatation. Pancreas:  Unremarkable.  No surrounding inflammatory changes. Spleen: Heterogeneously enlarged spleen with multiple areas of hypodense lesions, grossly unchanged from prior exam. Adrenals/Urinary Tract: Both adrenal glands appear normal. The kidneys and collecting system appear normal without evidence of urinary tract calculus or hydronephrosis. Bladder is unremarkable. Stomach/Bowel: The stomach, small bowel, and colon nondilated. There is a moderate to large amount of colonic stool present. Vascular/Lymphatic: No definite abdominal adenopathy seen, however limited due to lack of intravenous contrast. There is a 2.1 cm centrally necrotic left inguinal lymph node. Scattered vascular calcifications are seen. Reproductive: The prostate is unremarkable. Other: No evidence of abdominal wall mass or hernia. Musculoskeletal: No  acute or significant osseous findings. IMPRESSION: 1.  Partially visualized right perihilar mass with bulky adenopathy. 2. Unchanged 1.5 cm right lung base nodule. 3. New streaky atelectasis at the left lung base. 4. Large unchanged hypodense mass within the spleen. 5. Moderate to large amount of colonic stool without evidence of obstruction. 6. Left inguinal necrotic adenopathy. 7.  Aortic Atherosclerosis (ICD10-I70.0). Electronically Signed   By: Prudencio Pair M.D.   On: 04/05/2019 01:38    Procedures Procedures (including critical care time)  Medications Ordered in ED Medications  acetaminophen (TYLENOL) tablet 1,000 mg (1,000 mg Oral Given 04/05/19 0146)  ibuprofen (ADVIL) tablet 800 mg (800 mg Oral Given 04/05/19 0147)    ED Course  I have reviewed the triage vital signs and the nursing notes.  Pertinent labs & imaging results that were available during my care of the patient were reviewed by me and considered in my medical decision making (see chart for details).   Patient and I had a long discussion about the mass seen on CT, the adenopathy and the constipation and the need to be seen ASAP by oncology for further work up.  Patient was also advised to start taking the miralax and to take ibuprofen for pain.  Patient verbalizes understanding and agrees to follow up.  He's sleeping soundly in the room.  [name] was evaluated in Emergency Department on 04/05/2019 for the symptoms described in the history of present illness. He was evaluated in the context of the global COVID-19 pandemic, which necessitated consideration that the patient might be at risk for infection with the SARS-CoV-2 virus that causes COVID-19. Institutional protocols and algorithms that pertain to the evaluation of patients at risk for COVID-19 are in a state of rapid change based on information released by regulatory bodies including the CDC and federal and state organizations. These policies and algorithms were followed  during the patient's care in the ED.  Final Clinical Impression(s) / ED Diagnoses Final diagnoses:  Hilar mass  Splenomegaly  Lymphadenopathy  Constipation, unspecified constipation type   Return for intractable cough, coughing up blood,fevers >100.4 unrelieved by medication, shortness of breath, intractable vomiting, chest pain, shortness of breath, weakness,numbness, changes in speech, facial asymmetry,abdominal pain, passing out,Inability to tolerate liquids or food, cough, altered mental status or any concerns. No signs of systemic illness or infection. The patient is nontoxic-appearing on exam and vital signs are within normal limits.   I have reviewed the triage vital signs and the nursing notes. Pertinent labs &imaging results that were available during my care of the patient were reviewed by me and considered in my medical decision making (see chart for details).  After history, exam, and medical workup I feel the patient has been appropriately medically screened and is safe for discharge home. Pertinent diagnoses were discussed with the patient. Patient was given return Rx / DC Orders ED Discharge Orders         Ordered    polyethylene glycol (MIRALAX) 17 g packet  Daily     04/05/19 0218    docusate sodium (COLACE) 100 MG capsule  Every 12 hours     04/05/19 0218           Romulo Okray, MD 04/05/19 [id number]

## 2019-04-07 ENCOUNTER — Encounter: Payer: Self-pay | Admitting: *Deleted

## 2019-04-07 NOTE — Progress Notes (Signed)
Oncology Nurse Navigator Documentation  Oncology Nurse Navigator Flowsheets 04/07/2019  Navigator Location [facility]  Navigator Encounter Type Other/I followed up on Mr. Torpey PET being authorized but was unclear if this is completed. I contacted auth coordinator to clarify.   Telephone -  [facility] Date -  Multidisiplinary Clinic Type -  Patient Visit Type -  Treatment Phase Abnormal Scans  Barriers/Navigation Needs Coordination of Care  Education -  Interventions Coordination of Care  Acuity Level 2-Minimal Needs (1-2 Barriers Identified)  Coordination of Care Other  Education Method -  Time Spent with Patient 15

## 2019-04-10 ENCOUNTER — Telehealth: Payer: Self-pay | Admitting: *Deleted

## 2019-04-10 NOTE — Telephone Encounter (Signed)
PET Scan scheduled.  Attempted to reach patient to advise of scheduling at [facility] for 04/18/2019 arrival at 10:30 for PET at 11:00.  NPO 6 hours prior to visit.  Left message, pending call back.

## 2019-04-11 ENCOUNTER — Other Ambulatory Visit: Payer: Self-pay | Admitting: Critical Care Medicine

## 2019-04-11 ENCOUNTER — Telehealth: Payer: Self-pay | Admitting: Internal Medicine

## 2019-04-11 DIAGNOSIS — Z20822 Contact with and (suspected) exposure to covid-19: Secondary | ICD-10-CM

## 2019-04-11 NOTE — Telephone Encounter (Signed)
Scheduled appt per 1/15 sch message - spoke with pt and he is aware of changes

## 2019-04-12 LAB — NOVEL CORONAVIRUS, NAA: SARS-CoV-2, NAA: NOT DETECTED

## 2019-04-15 ENCOUNTER — Other Ambulatory Visit: Payer: Self-pay | Admitting: Physician Assistant

## 2019-04-15 ENCOUNTER — Ambulatory Visit: Payer: Medicaid Other | Admitting: Internal Medicine

## 2019-04-15 ENCOUNTER — Telehealth: Payer: Self-pay | Admitting: Medical Oncology

## 2019-04-15 DIAGNOSIS — R918 Other nonspecific abnormal finding of lung field: Secondary | ICD-10-CM

## 2019-04-15 MED ORDER — OXYCODONE-ACETAMINOPHEN 5-325 MG PO TABS: 1.0000 | ORAL_TABLET | Freq: Four times a day (QID) | ORAL | 0 refills | Status: DC | PRN

## 2019-04-15 NOTE — Telephone Encounter (Signed)
Last BM  8 days ago -pt states he is taking  golytley q day. abd pain persists. Per Dr Julien Nordmann refill Oxycodone. I told pt and sister to take a fleets enema and continue golytley. Keep PET scan appt.  Sister with lots of social concerns-housing, family suport, etc.  Referred pt to Social work.

## 2019-04-16 ENCOUNTER — Encounter: Payer: Self-pay | Admitting: *Deleted

## 2019-04-16 NOTE — Progress Notes (Signed)
Oncology Nurse Navigator Documentation  Oncology Nurse Navigator Flowsheets 04/16/2019  Abnormal Finding Date 03/24/2019  Diagnosis Status Additional Work Up  Navigator Follow Up Date: 04/21/2019  Navigator Follow Up Reason: Radiology  Navigator Location [facility]  Navigator Encounter Type Other/I followed up on [name] schedule. He is set up for his PET and follow up appts.    Telephone -  [facility] Date -  Multidisiplinary Clinic Type -  Patient Visit Type -  Treatment Phase Abnormal Scans  Barriers/Navigation Needs -  Education -  Interventions Other  Acuity Level 2-Minimal Needs (1-2 Barriers Identified)  Coordination of Care Other  Education Method -  Time Spent with Patient -

## 2019-04-17 ENCOUNTER — Encounter: Payer: Self-pay | Admitting: *Deleted

## 2019-04-17 NOTE — Progress Notes (Signed)
[place] Work  Clinical Social Work was referred by medical oncology RN and Development worker, international aid for assessment of psychosocial needs. RN provided information for Mardene Celeste, patient's sister, who is supporting patient through new diagnosis. Clinical Social Worker contacted caregiver by phone  to offer support and assess for needs.  Mardene Celeste shared she just recently had contact with patient after learning of his diagnosis and would like to support him- she plans to bring him to upcoming medical appointments.  CSW scheduled to meet with patient and sister after upcoming medical oncology appointment to assess for needs and provide support.      Gwinda Maine, LCSW  Clinical Social Worker [facility]

## 2019-04-18 ENCOUNTER — Encounter (HOSPITAL_COMMUNITY)
Admission: RE | Admit: 2019-04-18 | Discharge: 2019-04-18 | Disposition: A | Discharge status: Home / Self Care | Payer: Medicaid Other | Source: Ambulatory Visit | Attending: Oncology | Admitting: Oncology

## 2019-04-18 ENCOUNTER — Other Ambulatory Visit: Payer: Self-pay

## 2019-04-18 DIAGNOSIS — R918 Other nonspecific abnormal finding of lung field: Secondary | ICD-10-CM | POA: Diagnosis not present

## 2019-04-18 LAB — GLUCOSE, CAPILLARY: Glucose-Capillary: 88 mg/dL (ref 70–99)

## 2019-04-18 MED ORDER — FLUDEOXYGLUCOSE F - 18 (FDG) INJECTION
5.3500 | Freq: Once | INTRAVENOUS | Status: AC | PRN
  Administered 2019-04-18: 5.35 via INTRAVENOUS

## 2019-04-21 ENCOUNTER — Other Ambulatory Visit: Payer: Self-pay | Admitting: Internal Medicine

## 2019-04-21 ENCOUNTER — Inpatient Hospital Stay (HOSPITAL_BASED_OUTPATIENT_CLINIC_OR_DEPARTMENT_OTHER): Payer: Medicaid Other | Admitting: Internal Medicine

## 2019-04-21 ENCOUNTER — Encounter: Payer: Self-pay | Admitting: *Deleted

## 2019-04-21 ENCOUNTER — Encounter: Payer: Self-pay | Admitting: Internal Medicine

## 2019-04-21 ENCOUNTER — Other Ambulatory Visit: Payer: Self-pay

## 2019-04-21 VITALS — BP 96/67 | HR 73 | Temp 98.9°F | Resp 16 | Ht 67.0 in | Wt 107.9 lb

## 2019-04-21 DIAGNOSIS — R109 Unspecified abdominal pain: Secondary | ICD-10-CM | POA: Diagnosis not present

## 2019-04-21 DIAGNOSIS — R918 Other nonspecific abnormal finding of lung field: Secondary | ICD-10-CM

## 2019-04-21 DIAGNOSIS — R5383 Other fatigue: Secondary | ICD-10-CM | POA: Diagnosis not present

## 2019-04-21 DIAGNOSIS — I2510 Atherosclerotic heart disease of native coronary artery without angina pectoris: Secondary | ICD-10-CM | POA: Diagnosis not present

## 2019-04-21 DIAGNOSIS — R161 Splenomegaly, not elsewhere classified: Secondary | ICD-10-CM | POA: Diagnosis not present

## 2019-04-21 DIAGNOSIS — R590 Localized enlarged lymph nodes: Secondary | ICD-10-CM | POA: Diagnosis not present

## 2019-04-21 DIAGNOSIS — G893 Neoplasm related pain (acute) (chronic): Secondary | ICD-10-CM | POA: Diagnosis not present

## 2019-04-21 DIAGNOSIS — R630 Anorexia: Secondary | ICD-10-CM | POA: Diagnosis not present

## 2019-04-21 DIAGNOSIS — R0789 Other chest pain: Secondary | ICD-10-CM | POA: Diagnosis not present

## 2019-04-21 DIAGNOSIS — I10 Essential (primary) hypertension: Secondary | ICD-10-CM

## 2019-04-21 DIAGNOSIS — I255 Ischemic cardiomyopathy: Secondary | ICD-10-CM | POA: Diagnosis not present

## 2019-04-21 DIAGNOSIS — R634 Abnormal weight loss: Secondary | ICD-10-CM | POA: Diagnosis not present

## 2019-04-21 DIAGNOSIS — Z951 Presence of aortocoronary bypass graft: Secondary | ICD-10-CM | POA: Diagnosis not present

## 2019-04-21 DIAGNOSIS — Z79899 Other long term (current) drug therapy: Secondary | ICD-10-CM | POA: Diagnosis not present

## 2019-04-21 MED ORDER — OXYCODONE-ACETAMINOPHEN 5-325 MG PO TABS: 1.0000 | ORAL_TABLET | Freq: Four times a day (QID) | ORAL | 0 refills | Status: DC | PRN

## 2019-04-21 NOTE — Progress Notes (Signed)
[place] Work  Clinical Social Work was referred by medical oncology for assessment of psychosocial needs.  Clinical Social Worker met with patient and patient's sister, Mardene Celeste, to offer support and assess for needs.  Patient reported experiencing significant pain and had limited conversation during [place] visit today.  Patient's sister expressed frustration with lack of understanding of diagnosis and is eager to learn more regarding his cancer and treatment plan.  Patient shared [place]- has been arranged through a program at the [employer].  Mr. Jun had no other additional information regarding program but stated he can reside at this location through March. Per patient report, he does not have friends/support at the motel, but likes that it's quiet and has small kitchenette.  Patient's sister indicated their family would be providing support through transportation and ensuring patient get necessary medications.  Patient agreed to follow up with CSW at a later time to further process diagnosis and discuss concerns.  Patient's sister plans to contact CSW as needed.  CSW provided [employer] gift card to assist with food.   Gwinda Maine, LCSW  Clinical Social Worker [facility]

## 2019-04-21 NOTE — Progress Notes (Signed)
[place] [phone]   [phone]  OFFICE PROGRESS NOTE  Medicine, [facility] [address] [place] [address]  DIAGNOSIS:  highly suspicious malignancy concerning for primary bronchogenic carcinoma versus lymphoma.  He presented with bulky right hilar and central perihilar mass involving the right upper lobe and right middle lobe with massive enlargement of the spleen as well as lymphadenopathy as well as central necrotic lymphadenopathy in the left para-aortic, left common iliac, left external iliac and left inguinal chain suspicious for metastatic disease.  Tissue biopsy from the left inguinal lymph node showed necrotic tissue with no evidence of malignancy.  PRIOR THERAPY: None.  CURRENT THERAPY: Observation.  INTERVAL HISTORY: [name] 64 y.o. male returns to the clinic today for follow-up visit.  He was accompanied by his sister and his niece Verdene Lennert was available by phone.  The patient continues to complain of pain all over his body especially in the abdomen.  He is currently on Percocet for pain management and he is requesting refill.  The patient denied having any current chest pain but has shortness of breath with exertion with mild cough and no hemoptysis.  He continues to lose weight.  He denied having any fever or chills.  He had a PET scan performed recently and he is here for evaluation and discussion of his PET scan results.  MEDICAL HISTORY: Past Medical History:  Diagnosis Date  . CAD (coronary artery disease)    a. 04/2008 Cath: LM nl, LAD 50p, 67m, LCX min irregs, RCA 50p/m, 40d, RPDA 60-70ost; b. 04/2008 CABG x 4: LIMA->LAD, VG->D2, VG->RPDA->RPL.  . Cocaine abuse ([place])    a. Quit early 2019.  . Ischemic cardiomyopathy    a. LV gram: EF 30-35%, apical/periapical AK.  . Tobacco abuse     ALLERGIES:  has No Known Allergies.  MEDICATIONS:  Current Outpatient Medications  Medication Sig Dispense  Refill  . acetaminophen (TYLENOL) 325 MG tablet Take 2 tablets (650 mg total) by mouth every 4 (four) hours as needed for mild pain (or temp > 37.5 C (99.5 F)). (Patient not taking: Reported on 04/03/2019)    . docusate sodium (COLACE) 100 MG capsule Take 1 capsule (100 mg total) by mouth every 12 (twelve) hours. 60 capsule 0  . oxyCODONE-acetaminophen (PERCOCET/ROXICET) 5-325 MG tablet Take 1 tablet by mouth every 6 (six) hours as needed for severe pain. 20 tablet 0  . polyethylene glycol (MIRALAX / GLYCOLAX) 17 g packet Take 17 g by mouth daily. (Patient not taking: Reported on 04/03/2019) 14 each 0  . polyethylene glycol (MIRALAX) 17 g packet Take 17 g by mouth daily. 14 each 0   No current facility-administered medications for this visit.    SURGICAL HISTORY:  Past Surgical History:  Procedure Laterality Date  . BYPASS GRAFT    . CARDIAC SURGERY      REVIEW OF SYSTEMS:  Constitutional: positive for anorexia, fatigue and weight loss Eyes: negative Ears, nose, mouth, throat, and face: negative Respiratory: positive for dyspnea on exertion and pleurisy/chest pain Cardiovascular: negative Gastrointestinal: positive for abdominal pain Genitourinary:negative Integument/breast: negative Hematologic/lymphatic: negative Musculoskeletal:negative Neurological: negative Behavioral/Psych: negative Endocrine: negative Allergic/Immunologic: negative   PHYSICAL EXAMINATION: General appearance: alert, cooperative, fatigued and no distress Head: Normocephalic, without obvious abnormality, atraumatic Neck: no adenopathy, no JVD, supple, symmetrical, trachea midline and thyroid not enlarged, symmetric, no tenderness/mass/nodules Lymph nodes: Cervical, supraclavicular, and axillary nodes normal. Resp: diminished breath sounds LLL and  dullness to percussion LLL Back: symmetric, no curvature. ROM normal. No CVA tenderness. Cardio: regular rate and rhythm, S1, S2 normal, no murmur, click, rub or  gallop GI: soft, non-tender; bowel sounds normal; no masses,  no organomegaly Extremities: extremities normal, atraumatic, no cyanosis or edema Neurologic: Alert and oriented X 3, normal strength and tone. Normal symmetric reflexes. Normal coordination and gait  ECOG PERFORMANCE STATUS: 1 - Symptomatic but completely ambulatory  Blood pressure 96/67, pulse 73, temperature 98.9 F (37.2 C), temperature source Temporal, resp. rate 16, height 5\' 7"  (1.702 m), weight 107 lb 14.4 oz (48.9 kg), SpO2 100 %.  LABORATORY DATA: Lab Results  Component Value Date   WBC 8.3 04/04/2019   HGB 11.2 (L) 04/04/2019   HCT 35.4 (L) 04/04/2019   MCV 91.7 04/04/2019   PLT 666 (H) 04/04/2019      Chemistry      Component Value Date/Time   NA 144 04/04/2019 2006   K 4.0 04/04/2019 2006   CL 108 04/04/2019 2006   CO2 27 04/04/2019 2006   BUN 19 04/04/2019 2006   CREATININE 0.81 04/04/2019 2006      Component Value Date/Time   CALCIUM 9.3 04/04/2019 2006   ALKPHOS 65 04/04/2019 2006   AST 15 04/04/2019 2006   ALT 11 04/04/2019 2006   BILITOT 0.5 04/04/2019 2006       RADIOGRAPHIC STUDIES: CT CHEST W CONTRAST  Result Date: 03/25/2019 CLINICAL DATA:  Abnormal spleen and abdominal adenopathy on CT abdomen. Concern for bronchogenic carcinoma EXAM: CT CHEST WITH CONTRAST TECHNIQUE: Multidetector CT imaging of the chest was performed during intravenous contrast administration. CONTRAST:  26mL OMNIPAQUE IOHEXOL 300 MG/ML  SOLN COMPARISON:  CT abdomen 03/24/2019. FINDINGS: Cardiovascular: Post CABG anatomy. Mediastinum/Nodes: No axillary adenopathy. No mediastinal adenopathy. Bulky hilar adenopathy/mass on the RIGHT. Nodal masses measure 3.8 and 3.5 cm (image 92/3. Rounded peribronchial thickening in the RIGHT upper lobe measures 2.6 cm (image 85/3. There is collapse of the lateral segment of the RIGHT middle lobe. Small lingular nodule measuring 5 mm (image 76/4). Subpleural nodule LEFT upper lobe  measuring 5 mm on image 67/4. Lungs/Pleura: Upper Abdomen: Massively enlarged spleen as described on comparison abdominal CT. Adrenal glands not well imaged. Musculoskeletal: No aggressive osseous lesion IMPRESSION: 1. Bulky RIGHT hilar adenopathy and central perihilar mass. Mass involves the RIGHT upper lobe and RIGHT middle lobe lobe. Atelectasis of the RIGHT middle lobe. Findings most suggestive bronchogenic carcinoma. 2. Massively enlarged spleen consistent with malignancy as described on comparison abdominal CT. 3. No axillary or supraclavicular adenopathy. 4. Small LEFT upper lobe nodules.  Recommend attention on follow-up. Electronically Signed   By: Suzy Bouchard M.D.   On: 03/25/2019 12:14   CT ABDOMEN PELVIS W CONTRAST  Result Date: 03/24/2019 CLINICAL DATA:  Left lower quadrant abdominal pain. Painful left inguinal mass. EXAM: CT ABDOMEN AND PELVIS WITH CONTRAST TECHNIQUE: Multidetector CT imaging of the abdomen and pelvis was performed using the standard protocol following bolus administration of intravenous contrast. CONTRAST:  152mL OMNIPAQUE IOHEXOL 300 MG/ML  SOLN COMPARISON:  03/04/2019 chest radiograph. FINDINGS: Lower chest: Incompletely visualized large masslike focus of consolidation at the central right lung base involving right lower and right middle lobes measuring at least 8.0 x 6.1 cm (series 3/image 1). Separate 1.1 cm solid basilar right lower lobe pulmonary nodule (series 4/image 6). Coronary atherosclerosis. Hepatobiliary: Normal liver size. Subcentimeter hypodense segment 7 right liver lesion (series 3/image 20), too small to characterize. No additional liver lesions. Normal  gallbladder with no radiopaque cholelithiasis. No biliary ductal dilatation. Pancreas: No pancreatic mass. Borderline prominent main pancreatic duct (3 mm diameter). Spleen: Enlarged markedly abnormal spleen (15 cm craniocaudal length) with markedly heterogeneous soft tissue density. Adrenals/Urinary Tract:  No discrete adrenal nodules. Normal kidneys with no hydronephrosis and no renal mass. Normal bladder. Stomach/Bowel: Normal non-distended stomach. Normal caliber small bowel with no small bowel wall thickening. Appendix not discretely visualized. No pericecal inflammatory changes. Normal large bowel with no diverticulosis, large bowel wall thickening or pericolonic fat stranding. Vascular/Lymphatic: Normal caliber abdominal aorta. Patent portal, splenic, hepatic and renal veins. Enlarged centrally necrotic left inguinal lymph nodes, largest 2.6 cm short axis diameter (series 3/image 71). Enlarged centrally necrotic 2.5 cm left external iliac lymph node (series 3/image 63). Enlarged centrally necrotic 2.3 cm left common iliac node (series 3/image 56). Enlarged centrally necrotic left para-aortic lymph nodes up to 2.9 cm short axis diameter (series 3/image 35). Reproductive: Top-normal size prostate. Other: No pneumoperitoneum. No focal fluid collections. Small volume pelvic free fluid. Musculoskeletal: No aggressive appearing focal osseous lesions. Mild-to-moderate thoracolumbar spondylosis. IMPRESSION: 1. Incompletely imaged large 8 cm masslike focus of consolidation in the central right lung, cannot exclude primary bronchogenic carcinoma. Separate 1.1 cm right lung base solid pulmonary nodule. Dedicated chest CT with IV contrast recommended for further characterization. 2. Markedly abnormal enlarged spleen with heterogeneous soft tissue attenuation, nonspecific, suspicious for metastatic disease. 3. Centrally necrotic lymphadenopathy in the left para-aortic, left common iliac, left external iliac and left inguinal chains, suspicious for metastatic disease. The left inguinal adenopathy should be amenable to percutaneous biopsy. 4. Subcentimeter low-attenuation segment 7 right liver lesion, too small to characterize, for which follow-up imaging is advised. 5. Small volume pelvic free fluid. Electronically Signed    By: Ilona Sorrel M.D.   On: 03/24/2019 17:20   NM PET Image Initial (PI) Skull Base To Thigh  Result Date: 04/18/2019 CLINICAL DATA:  Initial treatment strategy for right lung mass, presumed bronchogenic carcinoma. Left inguinal nodal biopsy inconclusive, but suspicious for lymphoproliferative disorder. EXAM: NUCLEAR MEDICINE PET SKULL BASE TO THIGH TECHNIQUE: 5.35 mCi F-18 FDG was injected intravenously. Full-ring PET imaging was performed from the skull base to thigh after the radiotracer. CT data was obtained and used for attenuation correction and anatomic localization. Fasting blood glucose: 88 mg/dl COMPARISON:  Prior CTs 03/24/2019 and 03/25/2019. FINDINGS: Mediastinal blood pool activity: SUV max 1.4 Liver activity: SUV max 2.0 NECK: There is a single hypermetabolic left supraclavicular lymph node measuring approximately 11 mm on image 49/4. This has an SUV max 28.9. No other hypermetabolic or enlarged cervical lymph nodes.There are no lesions of the pharyngeal mucosal space. Incidental CT findings: Bilateral carotid atherosclerosis. CHEST: The bulky right perihilar mass is markedly hypermetabolic with an SUV max of 27.3. This demonstrates no central necrosis or associated bronchial obstruction and measures approximately 8.5 x 6.2 cm on image 41/8. There is an additional right lower lobe mass along the posterior aspect of the left atrium which is hypermetabolic, measuring approximately 3.0 cm on image 51/8 (SUV max 12.3). No other hypermetabolic pulmonary nodules. There is possible hypermetabolic right hilar adenopathy adjacent to the dominant right perihilar mass. No other hypermetabolic mediastinal, hilar or axillary lymph nodes. Incidental CT findings: Previous median sternotomy and CABG. Diffuse atherosclerosis of the aorta, great vessels and coronary arteries. Additional small nodules in the right lower lobe and lingula are stable. There is mild left lower lobe atelectasis. ABDOMEN/PELVIS: Again  demonstrated is heterogeneous enlargement of the spleen  which demonstrates diffuse hypermetabolic activity (SUV max 24.4). No hypermetabolic activity seen within the liver, pancreas or adrenal glands. There are multiple enlarged and hypermetabolic abdominopelvic lymph nodes. For example, celiac adenopathy has an SUV max of 26.0, and left periaortic adenopathy at the level of the upper sacrum has an SUV max of 20.5. A 3.5 x 2.3 cm left inguinal node on image 178/4 has an SUV max of 29.7. Incidental CT findings: The bladder is trabeculated. No hydronephrosis. Diffuse aortic and branch vessel atherosclerosis. SKELETON: There is no hypermetabolic activity to suggest osseous metastatic disease. Incidental CT findings: Mild degenerative changes in the spine and hips. IMPRESSION: 1. Extensive hypermetabolic adenopathy in the abdomen and pelvis with bulky right perihilar lung mass and left supraclavicular adenopathy, also hypermetabolic. 2. The heterogeneously enlarged spleen also demonstrates diffuse hypermetabolic activity. 3. No definite involvement of the liver, adrenal glands or bones. 4. The findings are best explained by a single diagnosis of lymphoma. Bronchogenic carcinoma is considered less likely. Additional tissue sampling recommended. Electronically Signed   By: Richardean Sale M.D.   On: 04/18/2019 14:05   CT Renal Stone Study  Result Date: 04/05/2019 CLINICAL DATA:  Left abdominal pain EXAM: CT ABDOMEN AND PELVIS WITHOUT CONTRAST TECHNIQUE: Multidetector CT imaging of the abdomen and pelvis was performed following the standard protocol without IV contrast. COMPARISON:  March 25, 2019 FINDINGS: Lower chest: Partially visualized large right hilar adenopathy and perihilar mass. There is a unchanged 1.5 cm pulmonary nodule at the right lung base. There is new streaky/patchy airspace opacity at the left lung base, likely atelectasis. There is unchanged cardiomegaly. Hepatobiliary: Although limited due to the  lack of intravenous contrast, normal in appearance without gross focal abnormality. No evidence of calcified gallstones or biliary ductal dilatation. Pancreas:  Unremarkable.  No surrounding inflammatory changes. Spleen: Heterogeneously enlarged spleen with multiple areas of hypodense lesions, grossly unchanged from prior exam. Adrenals/Urinary Tract: Both adrenal glands appear normal. The kidneys and collecting system appear normal without evidence of urinary tract calculus or hydronephrosis. Bladder is unremarkable. Stomach/Bowel: The stomach, small bowel, and colon nondilated. There is a moderate to large amount of colonic stool present. Vascular/Lymphatic: No definite abdominal adenopathy seen, however limited due to lack of intravenous contrast. There is a 2.1 cm centrally necrotic left inguinal lymph node. Scattered vascular calcifications are seen. Reproductive: The prostate is unremarkable. Other: No evidence of abdominal wall mass or hernia. Musculoskeletal: No acute or significant osseous findings. IMPRESSION: 1. Partially visualized right perihilar mass with bulky adenopathy. 2. Unchanged 1.5 cm right lung base nodule. 3. New streaky atelectasis at the left lung base. 4. Large unchanged hypodense mass within the spleen. 5. Moderate to large amount of colonic stool without evidence of obstruction. 6. Left inguinal necrotic adenopathy. 7.  Aortic Atherosclerosis (ICD10-I70.0). Electronically Signed   By: Prudencio Pair M.D.   On: 04/05/2019 01:38   [place] CORE BIOPSY (LYMPH NODES)  Result Date: 03/26/2019 INDICATION: Left inguinal metastatic adenopathy, no known primary EXAM: ULTRASOUND GUIDED CORE BIOPSY OF LEFT INGUINAL METASTATIC ADENOPATHY MEDICATIONS: 1% LIDOCAINE LOCAL ANESTHESIA/SEDATION: Versed 1.5mg  IV; Fentanyl 44mcg IV; Moderate Sedation Time:  10 MINUTES The patient was continuously monitored during the procedure by the interventional radiology nurse under my direct supervision. FLUOROSCOPY TIME:   Fluoroscopy Time: None. COMPLICATIONS: None immediate. PROCEDURE: The procedure, risks, benefits, and alternatives were explained to the patient. Questions regarding the procedure were encouraged and answered. The patient understands and consents to the procedure. The left inguinal area was prepped with  ChloraPrep in a sterile fashion, and a sterile drape was applied covering the operative field. A sterile gown and sterile gloves were used for the procedure. Local anesthesia was provided with 1% Lidocaine. Previous imaging reviewed. Preliminary ultrasound performed. The left inguinal adenopathy was localized and marked. Under sterile conditions and local anesthesia, 18 gauge core biopsy was advanced into the left inguinal adenopathy. Several 18 gauge core biopsies obtained. These were intact and placed in saline. Postprocedure imaging demonstrates no hemorrhage or hematoma. Patient tolerated the biopsy well. FINDINGS: Imaging confirms needle placed in the left inguinal adenopathy for core biopsy IMPRESSION: Successful ultrasound left inguinal adenopathy 18 gauge core biopsies Electronically Signed   By: Jerilynn Mages.  Shick M.D.   On: 03/26/2019 14:23    ASSESSMENT AND PLAN: This is a 64 years old African-American male with highly suspicious malignancy questionable primary bronchogenic carcinoma versus lymphoma presented with extensive hypermetabolic adenopathy in the abdomen and pelvis with bulky right perihilar mass and left supraclavicular adenopathy as well as large unchanged hypodense mass within the spleen. The patient had previous core biopsy of left inguinal lymph node but the final pathology showed no evidence for malignancy and it was consistent with necrotic tissue. I personally and independently reviewed the scan images and discussed the result and showed the images to the patient and his family. I will refer the patient to Dr. Roxan Hockey for reevaluation and consideration of biopsy either from the left  supraclavicular lymph node or bronchoscopy with endobronchial ultrasound and biopsy.  The other option will be referral again to interventional radiology for repeat core biopsy of the most accessible lymph node or mass away from the left inguinal lymph node that was biopsied in the past and showed necrotic tissue. I will arrange for the patient a follow-up appointment with me after the biopsy for evaluation and discussion of his treatment options. The patient and his sister requested no CODE BLUE status. For pain management I will give him refill for Percocet. He was advised to call immediately if he has any concerning symptoms in the interval. The patient voices understanding of current disease status and treatment options and is in agreement with the current care plan.  All questions were answered. The patient knows to call the clinic with any problems, questions or concerns. We can certainly see the patient much sooner if necessary.   Disclaimer: This note was dictated with voice recognition software. Similar sounding words can inadvertently be transcribed and may not be corrected upon review.

## 2019-04-21 NOTE — Progress Notes (Signed)
Oncology Nurse Navigator Documentation  Oncology Nurse Navigator Flowsheets 04/21/2019  Abnormal Finding Date -  Diagnosis Status -  Navigator Follow Up Date: -  Navigator Follow Up Reason: -  Navigator Location [facility]  Navigator Encounter Type Clinic/MDC/I met with [name] and his sister today during his visit with Dr. Julien Nordmann.  Patient plan of care is where to get tissue DX.  Per Dr. Julien Nordmann I updated thoracic surgery to see what are the patient's options.  I help to explain next steps to patient and his sister.  Will wait for an update.   Telephone -  [facility] Date -  Multidisiplinary Clinic Type -  Patient Visit Type MedOnc  Treatment Phase Abnormal Scans  Barriers/Navigation Needs Coordination of Care;Education  Education Other  Interventions Coordination of Care;Education  Acuity Level 2-Minimal Needs (1-2 Barriers Identified)  Coordination of Care Other  Education Method Verbal  Time Spent with Patient 30

## 2019-04-22 ENCOUNTER — Encounter: Payer: Self-pay | Admitting: Adult Health

## 2019-04-22 ENCOUNTER — Ambulatory Visit: Payer: Medicaid Other | Admitting: Adult Health

## 2019-04-22 VITALS — BP 100/65 | HR 81 | Temp 97.3°F | Ht 67.0 in | Wt 108.8 lb

## 2019-04-22 DIAGNOSIS — G893 Neoplasm related pain (acute) (chronic): Secondary | ICD-10-CM

## 2019-04-22 DIAGNOSIS — E782 Mixed hyperlipidemia: Secondary | ICD-10-CM

## 2019-04-22 DIAGNOSIS — I10 Essential (primary) hypertension: Secondary | ICD-10-CM

## 2019-04-22 DIAGNOSIS — I63511 Cerebral infarction due to unspecified occlusion or stenosis of right middle cerebral artery: Secondary | ICD-10-CM

## 2019-04-22 DIAGNOSIS — G8194 Hemiplegia, unspecified affecting left nondominant side: Secondary | ICD-10-CM | POA: Diagnosis not present

## 2019-04-22 DIAGNOSIS — R7303 Prediabetes: Secondary | ICD-10-CM

## 2019-04-22 MED ORDER — ASPIRIN EC 81 MG PO TBEC: 81.0000 mg | DELAYED_RELEASE_TABLET | Freq: Every day | ORAL | 3 refills | Status: DC

## 2019-04-22 NOTE — Patient Instructions (Signed)
Continue aspirin 81 mg daily  and atorvastatin  for secondary stroke prevention  Continue to follow up with PCP regarding cholesterol and blood pressure management   Continue to monitor blood pressure at home  Maintain strict control of hypertension with blood pressure goal below 130/90, diabetes with hemoglobin A1c goal below 6.5% and cholesterol with LDL cholesterol (bad cholesterol) goal below 70 mg/dL. I also advised the patient to eat a healthy diet with plenty of whole grains, cereals, fruits and vegetables, exercise regularly and maintain ideal body weight.  Followup in the future with me in 4 months or call earlier if needed       Thank you for coming to see [place] at [facility]. I hope we have been able to provide you high quality care today.  You may receive a patient satisfaction survey over the next few weeks. We would appreciate your feedback and comments so that we may continue to improve ourselves and the health of our patients.

## 2019-04-22 NOTE — Progress Notes (Signed)
I agree with the above plan 

## 2019-04-22 NOTE — Progress Notes (Signed)
[facility] [address] [place]. [place] [address] [phone]       HOSPITAL FOLLOW UP NOTE  [name] Date of Birth:  02-May-1955 Medical Record Number:  [id number]   Reason for Referral:  hospital stroke follow up    CHIEF COMPLAINT:  Chief Complaint  Patient presents with   Hospitalization Follow-up    Sister present. Rm 9. No new concerns at this time.     HPI: [name] being seen today for in office hospital follow-up regarding right MCA embolic infarct in setting of cocaine use and cardiomyopathy on 03/04/2019.  History obtained from patient, sister and chart review. Reviewed all radiology images and labs personally.  [name] with history of CAD s/p CABG, cocaine abuse, ischemic cardiomyopathy and tobacco abuse who presented on 03/04/2019 with L sided weakness.  Evaluated by Dr. Leonie Man and stroke team with stroke work-up revealing right MCA infarct embolic as evidenced on MRI in setting of cocaine use and cardiomyopathy.  CTA head/neck showed right M1 occlusion and severe bilateral ICA cavernous stenosis.  CT perfusion no core infarct.  2D echo showed diminished EF 40 to 45% with global hypokinesis.  UDS positive for cocaine.  Previously on aspirin 81 mg daily and recommended DAPT for 3 weeks then aspirin alone given mild stroke.  HTN stable.  LDL 136 and recommended resuming atorvastatin 40 mg daily as patient previously noncompliant.  Prediabetes with A1c 6.3.  Other stroke risk factors include tobacco use with smoking cessation counseling provided, EtOH use with alcohol level <10, substance abuse with UDS cocaine positive, CAD s/p CABG and stents and ischemic cardiomyopathy with known low EF 30 to 35%.  Residual stroke deficits with mild left hemiparesis and was discharged to CIR for ongoing therapy on 03/06/19.  During CIR, patient bouts of constipation KUB negative for ileus there was a large stool burden patient  did receive fleets enema.  Patient with incidental findings of inguinal mass with ultrasound showed oval hypoechoic mass just below the skin over the left inguinal region corresponding to patient's palpable abnormality.  Felt this could possibly represent abnormal lymph node versus complex fluid collection such as hematoma or subcutaneous abscess remained afebrile with ongoing monitoring.  He was eventually discharged home on 03/15/2019. 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  Biopsy of left inguinal metastatic adenopathy with final pathology showing necrotic tissue with no evidence for malignancy.  He did have follow-up with oncology on 04/03/2019 and recommended undergoing PET scan for further evaluation and additional biopsies.  Reevaluated yesterday by oncology after PET scan with high suspicion malignancy questionable primary bronchogenic carcinoma versus lymphoma with extensive hypermetabolic adenopathy in the abdomen and pelvis with bulky right perihilar mass and left supraclavicular adenopathy as well as large unchanged hypodense masses within the spleen.  He was referred to Dr. Roxan Hockey (thoracic surgery) for reevaluation and consideration of biopsy.  [name] is a 64 year old male who is being seen today for hospital stroke follow-up accompanied by his sister.  Residual stroke deficits of left hemiparesis but does endorse improvement.  He has not started  outpatient therapy due to multiple appointments and work-up with oncology.  He is currently in a wheelchair for which he uses for long  distance but is able to ambulate without difficulty short distance.  He was recently referred to social work by oncology office for psychosocial needs.  His sister recently had contact with patient after learning of his diagnosis and plans on supporting him ongoing. [place]  His greatest concern at today's visit is regarding excessive amount of pain which limits his daily functioning and activity.  Aspirin and Plavix were placed on hold during recent admission at the end of December due to undergoing biopsies and unable to determine why these have not been restarted.  He also has discontinued atorvastatin for undetermined reasons.  Blood pressure today 100/65.  No further concerns at this time.    ROS:   14 system review of systems performed and negative with exception of pain, weakness  PMH:  Past Medical History:  Diagnosis Date   CAD (coronary artery disease)    a. 04/2008 Cath: LM nl, LAD 50p, 66m, LCX min irregs, RCA 50p/m, 40d, RPDA 60-70ost; b. 04/2008 CABG x 4: LIMA->LAD, VG->D2, VG->RPDA->RPL.   Cocaine abuse ([place])    a. Quit early 2019.   Ischemic cardiomyopathy    a. LV gram: EF 30-35%, apical/periapical AK.   Tobacco abuse     PSH:  Past Surgical History:  Procedure Laterality Date   BYPASS GRAFT     CARDIAC SURGERY      Social History:  Social History   Socioeconomic History   Marital status: Single    Spouse name: Not on file   Number of children: Not on file   Years of education: Not on file   Highest education level: Not on file  Occupational History   Not on file  Tobacco Use   Smoking status: Current Every Day Smoker    Packs/day: 0.30    Types: Cigarettes   Smokeless tobacco: Current User   Tobacco comment: has smoked for 40+ yrs - at most 1ppd, currently 1ppwk.  Substance and Sexual Activity   Alcohol use: No    Comment: quite > 30 yrs ago.   Drug use: No    Comment: prev used cocaine - quit  early 2019.   Sexual activity: Not on file  Other Topics Concern   Not on file  Social History Narrative   Lives in [place] by himself.  Currently staying in a half-way house designed to help those addicted to drugs get clean, and find housing/employment.  He exercises some - walks/push ups.   Social Determinants of Health   Financial Resource Strain:    Difficulty of Paying Living Expenses: Not on file  Food Insecurity:    Worried About Charity fundraiser in the Last Year: Not on file   [employer] of Food in the Last Year: Not on file  Transportation Needs:    Lack of Transportation (Medical): Not on file   Lack of Transportation (Non-Medical): Not on file  Physical Activity:    Days of Exercise per Week: Not on file   Minutes of Exercise per Session: Not on file  Stress:    Feeling of Stress : Not on file  Social Connections:    Frequency of Communication with Friends and Family: Not on file   Frequency of Social Gatherings with Friends and Family: Not on file   Attends Religious Services: Not on  file   Active Member of Clubs or Organizations: Not on file   Attends Archivist Meetings: Not on file   Marital Status: Not on file  Intimate Partner Violence:    Fear of Current or Ex-Partner: Not on file   Emotionally Abused: Not on file   Physically Abused: Not on file   Sexually Abused: Not on file    Family History: History reviewed. No pertinent family history.  Medications:   Current Outpatient Medications on File Prior to Visit  Medication Sig Dispense Refill   oxyCODONE-acetaminophen (PERCOCET/ROXICET) 5-325 MG tablet Take 1 tablet by mouth every 6 (six) hours as needed for severe pain. 30 tablet 0   polyethylene glycol (MIRALAX / GLYCOLAX) 17 g packet Take 17 g by mouth daily. 14 each 0   acetaminophen (TYLENOL) 325 MG tablet Take 2 tablets (650 mg total) by mouth every 4 (four) hours as needed for mild pain (or temp > 37.5 C (99.5 F)).  (Patient not taking: Reported on 04/03/2019)     docusate sodium (COLACE) 100 MG capsule Take 1 capsule (100 mg total) by mouth every 12 (twelve) hours. (Patient not taking: Reported on 04/22/2019) 60 capsule 0   No current facility-administered medications on file prior to visit.    Allergies:  No Known Allergies   Physical Exam  Vitals:   04/22/19 1306  BP: 100/65  Pulse: 81  Temp: (!) 97.3 F (36.3 C)  TempSrc: Oral  Weight: 108 lb 12.8 oz (49.4 kg)  Height: 5\' 7"  (1.702 m)   Body mass index is 17.04 kg/m. No exam data present  No flowsheet data found.   General: Frail elderly African-American male, seated, in no evident distress Head: head normocephalic and atraumatic.   Neck: supple with no carotid or supraclavicular bruits Cardiovascular: regular rate and rhythm, no murmurs Musculoskeletal: no deformity Skin:  no rash/petichiae Vascular:  Normal pulses all extremities   Neurologic Exam Mental Status: Awake and fully alert.   Normal speech and language.  Oriented to place and time. Recent and remote memory intact. Attention span, concentration and fund of knowledge appropriate. Mood and affect appropriate.  Cranial Nerves: Fundoscopic exam reveals sharp disc margins. Pupils equal, briskly reactive to light. Extraocular movements full without nystagmus. Visual fields full to confrontation. Hearing intact. Facial sensation intact. Face, tongue, palate moves normally and symmetrically.  Motor: Normal bulk and tone. Normal strength in all tested extremity muscles except mild decrease left hand dexterity and mild left ankle dorsiflexion weakness. Sensory.: intact to touch , pinprick , position and vibratory sensation.  Coordination: Rapid alternating movements normal in all extremities. Finger-to-nose and heel-to-shin performed accurately bilaterally. Gait and Station: Deferred due to pain Reflexes: 1+ and symmetric. Toes downgoing.     NIHSS  0 Modified Rankin   2    Diagnostic Data (Labs, Imaging, Testing)  CT ANGIO HEAD W OR WO CONTRAST CT ANGIO NECK W OR WO CONTRAST CT CEREBRAL PERFUSION W CONTRAST 03/05/2019 IMPRESSION: 1. 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 2. 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 IMPRESSION: 1. No core infarct identified by CT perfusion parameters. However, earlier brain MRI shows abnormal diffusion restriction at multiple locations in the right MCA territory. 2. 7 mL region within the right frontal operculum meeting parameters for ischemic penumbra. Using a Tmax threshold of 4 seconds instead of 6 seconds demonstrates a larger region of ischemia measuring 100 mL.  MR BRAIN WO CONTRAST 03/04/2019 IMPRESSION: Multifocal acute ischemia within the right MCA territory. No hemorrhage or mass effect.  ECHOCARDIOGRAM 03/05/2019 IMPRESSIONS  1. EF 40-45% with global hypokinesis. There is akinesis of the apical septum, apical inferior, and apical segments. There is no obvious thrombus present in the LV, but would recommend a repeat contrast study to exclude apical thrombus in the patient  with concerns for stroke.  2. Left ventricular ejection fraction, by visual estimation, is 40 to 45%. The left ventricle has mildly decreased function. There is no left ventricular hypertrophy.  3. Apical septal segment, apical inferior segment, and apex are abnormal.  4. The left ventricle demonstrates regional wall motion abnormalities.  5. Global right ventricle has normal systolic function.The right ventricular size is normal. No increase in right ventricular wall thickness.  6. Left atrial size was normal.  7. Right atrial size was normal.  8. The mitral valve is myxomatous. Trivial mitral valve regurgitation.  9. The tricuspid valve is grossly normal. Tricuspid valve regurgitation is trivial. 10. The aortic valve is  tricuspid. Aortic valve regurgitation is not visualized. No evidence of aortic valve sclerosis or stenosis. 11. The pulmonic valve was grossly normal. Pulmonic valve regurgitation is not visualized. 12. TR signal is inadequate for assessing pulmonary artery systolic pressure. 13. The inferior vena cava is normal in size with greater than 50% respiratory variability, suggesting right atrial pressure of 3 mmHg. 14. A prior study was performed on 10/17/2017. 15. Changes from prior study are noted. 16. EF slightly improved ~45% compared with prior. WMA remain unchanged.  ECHOCARDIOGRAM LIMITED 03/06/2019 IMPRESSIONS  1. Left ventricular ejection fraction, by visual estimation, is 35 to 40%. The left ventricle has moderately decreased function. There is no left ventricular hypertrophy.  2. The left ventricle demonstrates regional wall motion abnormalities.  3. Global right ventricle has normal systolic function.The right ventricular size is normal.  4. Left atrial size was not assessed.  5. Right atrial size was normal.  6. Moderate pleural effusion.  7. The mitral valve was not assessed.  8. The tricuspid valve is not assessed.  9. The aortic valve was not assessed. 10. The pulmonic valve was not assessed. 11. Aortic root could not be assessed. 12. Limited study; no doppler performed; apical akinesis with moderate LV dysfunction; using definity, no obvious thrombus noted.        ASSESSMENT: [name] is a 64 y.o. year old male presented with left-sided weakness on 03/04/2019 with stroke work-up revealing right MCA infarct embolic in setting of cocaine use and cardiomyopathy. Vascular risk factors include UDS positive cocaine, EtOH use with alcohol level >10, HTN, HLD, prediabetes, ischemic cardiomyopathy and CAD s/p CABG and stents.  Residual stroke deficits of mild left hemiparesis but ongoing improvement.  After a stroke discharged home he returned to ED and has been following with  oncology due to highly suspicious malignancy questionable primary bronchogenic carcinoma versus lymphoma.    PLAN:  1. Right MCA stroke: Recommend restarting aspirin 81 mg daily and atorvastatin when cleared by oncology for secondary stroke prevention.  Maintain strict control of hypertension with blood pressure goal below 130/90, diabetes with hemoglobin A1c goal below 6.5% and cholesterol with LDL cholesterol (bad cholesterol) goal below 70 mg/dL.  I also advised the patient to eat a healthy diet with plenty of whole grains, cereals, fruits and vegetables, exercise regularly with at least 30 minutes of continuous activity daily and maintain ideal body weight. 2. HTN: Advised to continue current  treatment regimen.  Today's BP stable.  Advised to continue to monitor at home along with continued follow-up with PCP for management 3. HLD: Advised to continue current treatment regimen along with continued follow-up with PCP for future prescribing and monitoring of lipid panel 4. Pre-DMII: Continue to follow PCP for ongoing monitoring management 5. Ischemic cardiomyopathy and CAD: Continue to follow cardiology regularly 6. Substance abuse: Denies ongoing use of cocaine, tobacco or alcohol at this time 7. Possible malignant cancer: Continue to follow with oncology and possibly undergo additional biopsies with thoracic surgeon.  Discussion regarding possible benefit from palliative care due to ongoing pain and difficulty handling recent diagnosis.  Advised to discuss further with oncology as well as established Education officer, museum.    Follow up in 4 months or call earlier if needed   Greater than 50% of time during this 45 minute visit was spent on counseling, explanation of diagnosis of right MCA stroke, reviewing risk factor management of HTN, HLD, pre-DM, cocaine use, EtOH use, tobacco use, ischemic cardiomyopathy and CAD, discussion regarding recent diagnosis of likely malignant cancer, planning of further  management along with potential future management, and discussion with patient and family answering all questions.    Frann Rider, AGNP-BC  [facility] [address] [place], [place] [address]  Phone [phone] Fax [phone] Note: This document was prepared with digital dictation and possible smart phrase technology. Any transcriptional errors that result from this process are unintentional.

## 2019-04-23 ENCOUNTER — Encounter: Payer: Self-pay | Admitting: *Deleted

## 2019-04-23 ENCOUNTER — Telehealth: Payer: Self-pay | Admitting: Medical Oncology

## 2019-04-23 DIAGNOSIS — R918 Other nonspecific abnormal finding of lung field: Secondary | ICD-10-CM

## 2019-04-23 NOTE — Progress Notes (Signed)
Dr. Roxan Hockey notified me he will do bx.  I updated his office staff.

## 2019-04-23 NOTE — Progress Notes (Signed)
Oncology Nurse Navigator Documentation  Oncology Nurse Navigator Flowsheets 04/23/2019  Abnormal Finding Date -  Diagnosis Status -  Navigator Follow Up Date: -  Navigator Follow Up Reason: -  Navigator Location [facility]  Navigator Encounter Type Other/I contacted Dr. Roxan Hockey about bx.    Telephone -  [facility] [facility] Date -  Multidisiplinary Clinic Type -  Patient Visit Type -  Treatment Phase Abnormal Scans  Barriers/Navigation Needs Coordination of Care  Education -  Interventions Coordination of Care  Acuity Level 2-Minimal Needs (1-2 Barriers Identified)  Coordination of Care Other  Education Method -  Time Spent with Patient 15

## 2019-04-23 NOTE — Telephone Encounter (Signed)
Uncontrolled pain. 8/10 all over.  He picked  up Oxycodone refill yesterday and said he has taken 4 tabs  since he picked up rx. He  took a Ambulance person about 1100 today . I told him he can take another one as needed at  5 pm , 11 pm and then 5 am , etc. He voiced understanding. The TV was on in the background. He is staying at [facility] [address]. Hi sister goes to see regularly. I told Iain that if his pain is not under control with current tx plan ,  then he may need to go to ED.

## 2019-04-26 ENCOUNTER — Emergency Department (HOSPITAL_COMMUNITY)
Admission: EM | Admit: 2019-04-26 | Discharge: 2019-04-26 | Disposition: A | Discharge status: Home / Self Care | Payer: Medicaid Other | Attending: Emergency Medicine | Admitting: Emergency Medicine

## 2019-04-26 ENCOUNTER — Encounter (HOSPITAL_COMMUNITY): Payer: Self-pay | Admitting: Emergency Medicine

## 2019-04-26 ENCOUNTER — Other Ambulatory Visit: Payer: Self-pay

## 2019-04-26 DIAGNOSIS — Z8673 Personal history of transient ischemic attack (TIA), and cerebral infarction without residual deficits: Secondary | ICD-10-CM | POA: Insufficient documentation

## 2019-04-26 DIAGNOSIS — C3490 Malignant neoplasm of unspecified part of unspecified bronchus or lung: Secondary | ICD-10-CM | POA: Insufficient documentation

## 2019-04-26 DIAGNOSIS — I2510 Atherosclerotic heart disease of native coronary artery without angina pectoris: Secondary | ICD-10-CM | POA: Diagnosis not present

## 2019-04-26 DIAGNOSIS — F17210 Nicotine dependence, cigarettes, uncomplicated: Secondary | ICD-10-CM | POA: Insufficient documentation

## 2019-04-26 DIAGNOSIS — F17228 Nicotine dependence, chewing tobacco, with other nicotine-induced disorders: Secondary | ICD-10-CM | POA: Diagnosis not present

## 2019-04-26 DIAGNOSIS — I5042 Chronic combined systolic (congestive) and diastolic (congestive) heart failure: Secondary | ICD-10-CM | POA: Diagnosis not present

## 2019-04-26 DIAGNOSIS — I110 Hypertensive heart disease with heart failure: Secondary | ICD-10-CM | POA: Diagnosis not present

## 2019-04-26 DIAGNOSIS — R52 Pain, unspecified: Secondary | ICD-10-CM | POA: Diagnosis present

## 2019-04-26 HISTORY — DX: Malignant (primary) neoplasm, unspecified: C80.1

## 2019-04-26 MED ORDER — OXYCODONE-ACETAMINOPHEN 5-325 MG PO TABS
2.0000 | ORAL_TABLET | Freq: Once | ORAL | Status: AC
  Administered 2019-04-26: 2 via ORAL
  Filled 2019-04-26: qty 2

## 2019-04-26 MED ORDER — MORPHINE SULFATE (PF) 4 MG/ML IV SOLN
4.0000 mg | Freq: Once | INTRAVENOUS | Status: AC
  Administered 2019-04-26: 09:00:00 4 mg via INTRAVENOUS
  Filled 2019-04-26: qty 1

## 2019-04-26 MED ORDER — MORPHINE SULFATE (PF) 4 MG/ML IV SOLN
4.0000 mg | Freq: Once | INTRAVENOUS | Status: AC
  Administered 2019-04-26: 10:00:00 4 mg via INTRAVENOUS
  Filled 2019-04-26: qty 1

## 2019-04-26 NOTE — ED Provider Notes (Signed)
[facility] EMERGENCY DEPARTMENT Provider Note   CSN: [id number] Arrival date & time: 04/26/19  0756   History Chief Complaint  Patient presents with  . pain    [name] is a 64 y.o. male who presents with pain. Pt was recently diagnosed with metastatic lung cancer and is being followed by Dr. Julien Nordmann at the [facility]. He is receiving Percocet 5/325mg  for pain control which he takes once every 6 hours. He states that his pain is "all over" and he is having difficulty getting rest at night. He denies any focal areas of pain. No fever or chills. He states he sometimes gets in a warm bath or uses a heating pad and this temporarily helps. He states pain is currently 8 or 9/10  HPI     Past Medical History:  Diagnosis Date  . CAD (coronary artery disease)    a. 04/2008 Cath: LM nl, LAD 50p, 15m, LCX min irregs, RCA 50p/m, 40d, RPDA 60-70ost; b. 04/2008 CABG x 4: LIMA->LAD, VG->D2, VG->RPDA->RPL.  . Cancer ([place])   . Cocaine abuse ([place])    a. Quit early 2019.  . Ischemic cardiomyopathy    a. LV gram: EF 30-35%, apical/periapical AK.  . Tobacco abuse     Patient Active Problem List   Diagnosis Date Noted  . Mass of upper lobe of right lung 04/03/2019  . Lymphadenopathy, inguinal 04/03/2019  . Splenomegaly 04/03/2019  . Lung mass 03/24/2019  . Slow transit constipation   . Inguinal mass   . Chronic combined systolic and diastolic CHF (congestive heart failure) ([place])   . Mass of left inguinal region   . Greater trochanteric bursitis of left hip   . Hyponatremia   . Acute blood loss anemia   . Chronic combined systolic (congestive) and diastolic (congestive) heart failure ([place])   . Essential hypertension   . Left leg pain   . 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 Left hemiparesis ([place]) 03/06/2019  . Acute cerebrovascular accident (CVA) ([place]) 03/04/2019  . Chest pain 10/16/2017  . Hyperlipidemia 06/16/2008  . TOBACCO ABUSE  06/16/2008  . MARIJUANA ABUSE 06/16/2008  . Cocaine abuse ([place]) 06/16/2008  . CAD, ARTERY BYPASS GRAFT 04/30/2008    Past Surgical History:  Procedure Laterality Date  . BYPASS GRAFT    . CARDIAC SURGERY         No family history on file.  Social History   Tobacco Use  . Smoking status: Current Every Day Smoker    Packs/day: 0.30    Types: Cigarettes  . Smokeless tobacco: Current User  . Tobacco comment: has smoked for 40+ yrs - at most 1ppd, currently 1ppwk.  Substance Use Topics  . Alcohol use: No    Comment: quite > 30 yrs ago.  . Drug use: No    Comment: prev used cocaine - quit early 2019.    Home Medications Prior to Admission medications   Medication Sig Start Date End Date Taking? Authorizing Provider  acetaminophen (TYLENOL) 325 MG tablet Take 2 tablets (650 mg total) by mouth every 4 (four) hours as needed for mild pain (or temp > 37.5 C (99.5 F)). Patient not taking: Reported on 04/03/2019 03/27/19   Nita Sells, MD  aspirin EC 81 MG tablet Take 1 tablet (81 mg total) by mouth daily. 04/22/19   Frann Rider, NP  docusate sodium (COLACE) 100 MG capsule Take 1 capsule (100 mg total) by mouth every 12 (twelve) hours. Patient not  taking: Reported on 04/22/2019 04/05/19   Palumbo, April, MD  oxyCODONE-acetaminophen (PERCOCET/ROXICET) 5-325 MG tablet Take 1 tablet by mouth every 6 (six) hours as needed for severe pain. 04/21/19   [name], MD  polyethylene glycol (MIRALAX / GLYCOLAX) 17 g packet Take 17 g by mouth daily. 03/28/19   Nita Sells, MD    Allergies    Patient has no known allergies.  Review of Systems   Review of Systems  Constitutional: Negative for chills and fever.  Musculoskeletal:       +diffuse pain    Physical Exam Updated Vital Signs BP 109/83 (BP Location: Left Arm)   Pulse 80   Temp 98.3 F (36.8 C) (Oral)   Resp 17   Ht 5\' 7"  (1.702 m)   Wt 40.8 kg   SpO2 98%   BMI 14.10 kg/m   Physical Exam Vitals and  nursing note reviewed.  Constitutional:      General: He is not in acute distress.    Appearance: He is well-developed.     Comments: Chronically ill appearing thin male in NAD. Tearful at times  HENT:     Head: Normocephalic and atraumatic.  Eyes:     General: No scleral icterus.       Right eye: No discharge.        Left eye: No discharge.     Conjunctiva/sclera: Conjunctivae normal.     Pupils: Pupils are equal, round, and reactive to light.  Cardiovascular:     Rate and Rhythm: Normal rate and regular rhythm.  Pulmonary:     Effort: Pulmonary effort is normal. No respiratory distress.     Breath sounds: Normal breath sounds.  Abdominal:     General: There is no distension.     Palpations: Abdomen is soft.     Tenderness: There is no abdominal tenderness.  Musculoskeletal:     Cervical back: Normal range of motion.  Skin:    General: Skin is warm and dry.  Neurological:     Mental Status: He is alert and oriented to person, place, and time.  Psychiatric:        Mood and Affect: Mood is depressed.        Behavior: Behavior normal.     ED Results / Procedures / Treatments   Labs (all labs ordered are listed, but only abnormal results are displayed) Labs Reviewed - No data to display  EKG None  Radiology No results found.  Procedures Procedures (including critical care time)  Medications Ordered in ED Medications  morphine 4 MG/ML injection 4 mg (4 mg Intravenous Given 04/26/19 0839)  morphine 4 MG/ML injection 4 mg (4 mg Intravenous Given 04/26/19 1026)  oxyCODONE-acetaminophen (PERCOCET/ROXICET) 5-325 MG per tablet 2 tablet (2 tablets Oral Given 04/26/19 1025)    ED Course  I have reviewed the triage vital signs and the nursing notes.  Pertinent labs & imaging results that were available during my care of the patient were reviewed by me and considered in my medical decision making (see chart for details).  64 year old male presents with intractable pain from  metastatic lung cancer. He is not getting relief from home meds. Pain is 9/10 and he is tearful. Vitals are reassuring. Will give IV Morphine and reassess.  On recheck patient states pain is 8 out of 10.  Will give additional dose of morphine and Percocet.  On recheck patient is reporting pain 6 out of 10.  Will d/c home. Advised to  increase his dose of pain medicine to 2 pills every 4-6 hours.  He was advised to follow-up closely with his oncologist.  MDM Rules/Calculators/A&P                      Final Clinical Impression(s) / ED Diagnoses Final diagnoses:  Primary malignant neoplasm of lung metastatic to other site, unspecified laterality [facility])    Rx / DC Orders ED Discharge Orders    None       Recardo Evangelist, PA-C 04/26/19 [place], MD 04/27/19 [phone]

## 2019-04-26 NOTE — ED Triage Notes (Signed)
Pt reports generalized pain all over.  States he was recently diagnosed with cancer but unsure where cancer is located.  Denies nausea, vomiting, or any other symptoms.

## 2019-04-26 NOTE — Discharge Instructions (Signed)
Please increase your Percocet dose to 2 pills every 4-6 hours Continue Miralax for constipation Call Dr. Worthy Flank office on Monday to discuss pain management

## 2019-04-26 NOTE — ED Notes (Signed)
Pt d/c home per MD order. Discharge summary reviewed, pt verbalizes understanding. Pt sister is discharge ride home. Pt off unit via [place] with CNA.

## 2019-04-28 ENCOUNTER — Other Ambulatory Visit: Payer: Self-pay | Admitting: Internal Medicine

## 2019-04-28 ENCOUNTER — Telehealth: Payer: Self-pay | Admitting: Medical Oncology

## 2019-04-28 MED ORDER — FENTANYL 25 MCG/HR TD PT72: 1.0000 | MEDICATED_PATCH | TRANSDERMAL | 0 refills | Status: DC

## 2019-04-28 NOTE — Telephone Encounter (Addendum)
[name] notified.

## 2019-04-28 NOTE — Telephone Encounter (Signed)
Pain Management-I told Verdene Lennert about new pain patches and instructions given. I also told her pt should not need as much percocet in the next 24 hours because the patch should give him continuous relief. She verbalized understanding.

## 2019-04-28 NOTE — Telephone Encounter (Signed)
I added fentanyl patch 25 mcg/hour every 3 days to his medication.  It was sent to [facility] pharmacy.

## 2019-04-28 NOTE — Telephone Encounter (Addendum)
ED 1/30 for pain management- Per provider note pt was instructed to increase percocet from 1 tablet q 6 hours to to 2 tablets q 4-6 hours prn. Last refill was 1/25.  I called pt for pain management update and pill count He has 8 tablets left .    He said he is taking the 2 tablets q 4 hours prn and it  "makes my pain better".

## 2019-05-02 ENCOUNTER — Other Ambulatory Visit: Payer: Self-pay

## 2019-05-02 DIAGNOSIS — Z20822 Contact with and (suspected) exposure to covid-19: Secondary | ICD-10-CM

## 2019-05-04 LAB — NOVEL CORONAVIRUS, NAA: SARS-CoV-2, NAA: NOT DETECTED

## 2019-05-06 ENCOUNTER — Institutional Professional Consult (permissible substitution): Payer: Medicaid Other | Admitting: Thoracic Surgery (Cardiothoracic Vascular Surgery)

## 2019-05-06 ENCOUNTER — Encounter: Payer: Self-pay | Admitting: Thoracic Surgery (Cardiothoracic Vascular Surgery)

## 2019-05-06 ENCOUNTER — Other Ambulatory Visit: Payer: Self-pay | Admitting: *Deleted

## 2019-05-06 ENCOUNTER — Other Ambulatory Visit: Payer: Self-pay

## 2019-05-06 VITALS — BP 100/67 | HR 84 | Temp 98.1°F | Resp 20 | Ht 67.0 in | Wt 90.0 lb

## 2019-05-06 DIAGNOSIS — R590 Localized enlarged lymph nodes: Secondary | ICD-10-CM

## 2019-05-06 DIAGNOSIS — R918 Other nonspecific abnormal finding of lung field: Secondary | ICD-10-CM | POA: Diagnosis not present

## 2019-05-06 NOTE — H&P (View-Only) (Signed)
PCP is Medicine, [facility] Referring Provider is [name], MD  Chief Complaint  Patient presents with  . Lung Mass    Surgical eval, PET Scan 04/18/19, Chest CT 03/24/20    HPI: [name] sent for consultation for possible lymph node biopsy.  [name] is a 64 year old man with a history of coronary disease, coronary bypass grafting in 2010, cocaine abuse, ischemic cardiomyopathy, and tobacco abuse.  He presented back in December with left inguinal pain.  He said he had had a lump there for long time but it was suddenly starting to hurt.  A CT of the abdomen and pelvis showed a masslike focus of consolidation in the right lung, splenomegaly, and heterogeneous soft tissue attenuation suspicious for metastatic disease.  There was centrally necrotic adenopathy in the periaortic iliac and inguinal chains.  A needle biopsy of the left inguinal node showed necrotic tissue and was nondiagnostic.  A PET/CT showed all of the areas of suspicion were markedly hypermetabolic.  Findings were consistent with widely metastatic cancer.  Primary differential is lung cancer primary versus lymphoma.  He is been feeling poorly.  He had a stroke in December.  He has lost a significant amount of weight.  He complains of poor appetite and fatigue.  He is short of breath with exertion.  Past Medical History:  Diagnosis Date  . CAD (coronary artery disease)    a. 04/2008 Cath: LM nl, LAD 50p, 86m, LCX min irregs, RCA 50p/m, 40d, RPDA 60-70ost; b. 04/2008 CABG x 4: LIMA->LAD, VG->D2, VG->RPDA->RPL.  . Cancer ([place])   . Cocaine abuse ([place])    a. Quit early 2019.  . Ischemic cardiomyopathy    a. LV gram: EF 30-35%, apical/periapical AK.  . Tobacco abuse     Past Surgical History:  Procedure Laterality Date  . BYPASS GRAFT    . CARDIAC SURGERY      History reviewed. No pertinent family history.  Social History Social History   Tobacco Use  . Smoking status: Current Every Day  Smoker    Packs/day: 0.30    Types: Cigarettes  . Smokeless tobacco: Current User  . Tobacco comment: has smoked for 40+ yrs - at most 1ppd, currently 1ppwk.  Substance Use Topics  . Alcohol use: No    Comment: quite > 30 yrs ago.  . Drug use: No    Comment: prev used cocaine - quit early 2019.    Current Outpatient Medications  Medication Sig Dispense Refill  . acetaminophen (TYLENOL) 325 MG tablet Take 2 tablets (650 mg total) by mouth every 4 (four) hours as needed for mild pain (or temp > 37.5 C (99.5 F)).    Marland Kitchen aspirin EC 81 MG tablet Take 1 tablet (81 mg total) by mouth daily. 30 tablet 3  . docusate sodium (COLACE) 100 MG capsule Take 1 capsule (100 mg total) by mouth every 12 (twelve) hours. 60 capsule 0  . fentaNYL (DURAGESIC) 25 MCG/HR Place 1 patch onto the skin every 3 (three) days. 5 patch 0  . oxyCODONE-acetaminophen (PERCOCET/ROXICET) 5-325 MG tablet Take 1 tablet by mouth every 6 (six) hours as needed for severe pain. 30 tablet 0  . polyethylene glycol (MIRALAX / GLYCOLAX) 17 g packet Take 17 g by mouth daily. 14 each 0   No current facility-administered medications for this visit.    No Known Allergies  Review of Systems  Constitutional: Positive for activity change, appetite change and unexpected weight change. Negative for chills, diaphoresis  and fever.  HENT: Negative for trouble swallowing and voice change.   Eyes: Positive for visual disturbance (Blurry).  Respiratory: Positive for shortness of breath.   Genitourinary: Positive for frequency.  Hematological: Positive for adenopathy. Does not bruise/bleed easily.    BP 100/67 (BP Location: Right Arm, Patient Position: Sitting, Cuff Size: Normal)   Pulse 84   Temp 98.1 F (36.7 C) (Skin)   Resp 20   Ht 5\' 7"  (1.702 m)   Wt 90 lb (40.8 kg)   SpO2 96% Comment: RA  BMI 14.10 kg/m  Physical Exam Vitals reviewed.  Constitutional:      General: He is not in acute distress.    Appearance: He is  ill-appearing.  HENT:     Head: Normocephalic and atraumatic.  Eyes:     General: No scleral icterus.    Extraocular Movements: Extraocular movements intact.  Cardiovascular:     Rate and Rhythm: Normal rate and regular rhythm.     Heart sounds: No murmur. No friction rub. No gallop.   Pulmonary:     Effort: No respiratory distress.     Breath sounds: Normal breath sounds. No wheezing.  Abdominal:     General: There is no distension.     Palpations: Abdomen is soft.  Musculoskeletal:     Cervical back: Neck supple.  Lymphadenopathy:     Cervical: Cervical adenopathy (Left supraclavicular node) present.  Skin:    General: Skin is warm and dry.     Comments: Tender left inguinal node  Neurological:     Mental Status: He is oriented to person, place, and time.     Cranial Nerves: No cranial nerve deficit.     Motor: Weakness (Very minimal left hand weakness) present.    Diagnostic Tests: NUCLEAR MEDICINE PET SKULL BASE TO THIGH  TECHNIQUE: 5.35 mCi F-18 FDG was injected intravenously. Full-ring PET imaging was performed from the skull base to thigh after the radiotracer. CT data was obtained and used for attenuation correction and anatomic localization.  Fasting blood glucose: 88 mg/dl  COMPARISON:  Prior CTs 03/24/2019 and 03/25/2019.  FINDINGS: Mediastinal blood pool activity: SUV max 1.4  Liver activity: SUV max 2.0  NECK:  There is a single hypermetabolic left supraclavicular lymph node measuring approximately 11 mm on image 49/4. This has an SUV max 28.9. No other hypermetabolic or enlarged cervical lymph nodes.There are no lesions of the pharyngeal mucosal space.  Incidental CT findings: Bilateral carotid atherosclerosis.  CHEST:  The bulky right perihilar mass is markedly hypermetabolic with an SUV max of 27.3. This demonstrates no central necrosis or associated bronchial obstruction and measures approximately 8.5 x 6.2 cm on image 41/8. There  is an additional right lower lobe mass along the posterior aspect of the left atrium which is hypermetabolic, measuring approximately 3.0 cm on image 51/8 (SUV max 12.3). No other hypermetabolic pulmonary nodules. There is possible hypermetabolic right hilar adenopathy adjacent to the dominant right perihilar mass. No other hypermetabolic mediastinal, hilar or axillary lymph nodes.  Incidental CT findings: Previous median sternotomy and CABG. Diffuse atherosclerosis of the aorta, great vessels and coronary arteries. Additional small nodules in the right lower lobe and lingula are stable. There is mild left lower lobe atelectasis.  ABDOMEN/PELVIS:  Again demonstrated is heterogeneous enlargement of the spleen which demonstrates diffuse hypermetabolic activity (SUV max 24.4). No hypermetabolic activity seen within the liver, pancreas or adrenal glands. There are multiple enlarged and hypermetabolic abdominopelvic lymph nodes. For example, celiac adenopathy has  an SUV max of 26.0, and left periaortic adenopathy at the level of the upper sacrum has an SUV max of 20.5. A 3.5 x 2.3 cm left inguinal node on image 178/4 has an SUV max of 29.7.  Incidental CT findings: The bladder is trabeculated. No hydronephrosis. Diffuse aortic and branch vessel atherosclerosis.  SKELETON:  There is no hypermetabolic activity to suggest osseous metastatic disease.  Incidental CT findings: Mild degenerative changes in the spine and hips.  IMPRESSION: 1. Extensive hypermetabolic adenopathy in the abdomen and pelvis with bulky right perihilar lung mass and left supraclavicular adenopathy, also hypermetabolic. 2. The heterogeneously enlarged spleen also demonstrates diffuse hypermetabolic activity. 3. No definite involvement of the liver, adrenal glands or bones. 4. The findings are best explained by a single diagnosis of lymphoma. Bronchogenic carcinoma is considered less likely. Additional  tissue sampling recommended.   Electronically Signed   By: Richardean Sale M.D.   On: 04/18/2019 14:05 I personally reviewed the CT images and concur with the findings noted above  Impression: [name] is a 64 year old man with a history of coronary disease, coronary bypass grafting in 2010, cocaine abuse, ischemic cardiomyopathy, and tobacco abuse.  He has been found to have extensive abnormalities on PET/CT with hypermetabolic areas including his right hilum, spleen, and extensive lymphadenopathy.  The most accessible node was a left inguinal node that was biopsied in December.  Unfortunately, that node was necrotic and did not yield a diagnosis.  The differential diagnosis is primarily lymphoma versus primary lung cancer with widespread metastases.  I think lymphoma is far more likely.  It is possible he could have more than one primary but more than likely this all is lymphoma.  In any event he needs a tissue diagnosis to guide therapy.  Reviewed the scans I think the best option would be to do an excisional biopsy of the left supraclavicular lymph node.  This can be done with minimal morbidity on an outpatient basis.  I discussed the possibility of a left supraclavicular lymph node biopsy with Mr. Haub and his sister.  I informed him of the general nature of the procedure.  They understand we would do this under local with some intravenous sedation.  We would plan to do it as an outpatient.  They understand this is diagnostic and not therapeutic.  I informed him of the indications, risk, benefits, and alternatives.  They understand the risk include those associated with sedation and local anesthesia, in addition to wound infection, lymphocele, nerve injury, and a nondiagnostic biopsy.  He accepts the risk and wishes to proceed.  Plan: Left supraclavicular lymph node biopsy on 05/07/2018  [name], MD [facility] [phone]

## 2019-05-06 NOTE — Progress Notes (Signed)
PCP is Medicine, [facility] Referring Provider is [name], MD  Chief Complaint  Patient presents with  . Lung Mass    Surgical eval, PET Scan 04/18/19, Chest CT 03/24/20    HPI: [name] sent for consultation for possible lymph node biopsy.  [name] is a 64 year old man with a history of coronary disease, coronary bypass grafting in 2010, cocaine abuse, ischemic cardiomyopathy, and tobacco abuse.  He presented back in December with left inguinal pain.  He said he had had a lump there for long time but it was suddenly starting to hurt.  A CT of the abdomen and pelvis showed a masslike focus of consolidation in the right lung, splenomegaly, and heterogeneous soft tissue attenuation suspicious for metastatic disease.  There was centrally necrotic adenopathy in the periaortic iliac and inguinal chains.  A needle biopsy of the left inguinal node showed necrotic tissue and was nondiagnostic.  A PET/CT showed all of the areas of suspicion were markedly hypermetabolic.  Findings were consistent with widely metastatic cancer.  Primary differential is lung cancer primary versus lymphoma.  He is been feeling poorly.  He had a stroke in December.  He has lost a significant amount of weight.  He complains of poor appetite and fatigue.  He is short of breath with exertion.  Past Medical History:  Diagnosis Date  . CAD (coronary artery disease)    a. 04/2008 Cath: LM nl, LAD 50p, 29m, LCX min irregs, RCA 50p/m, 40d, RPDA 60-70ost; b. 04/2008 CABG x 4: LIMA->LAD, VG->D2, VG->RPDA->RPL.  . Cancer ([place])   . Cocaine abuse ([place])    a. Quit early 2019.  . Ischemic cardiomyopathy    a. LV gram: EF 30-35%, apical/periapical AK.  . Tobacco abuse     Past Surgical History:  Procedure Laterality Date  . BYPASS GRAFT    . CARDIAC SURGERY      History reviewed. No pertinent family history.  Social History Social History   Tobacco Use  . Smoking status: Current Every Day  Smoker    Packs/day: 0.30    Types: Cigarettes  . Smokeless tobacco: Current User  . Tobacco comment: has smoked for 40+ yrs - at most 1ppd, currently 1ppwk.  Substance Use Topics  . Alcohol use: No    Comment: quite > 30 yrs ago.  . Drug use: No    Comment: prev used cocaine - quit early 2019.    Current Outpatient Medications  Medication Sig Dispense Refill  . acetaminophen (TYLENOL) 325 MG tablet Take 2 tablets (650 mg total) by mouth every 4 (four) hours as needed for mild pain (or temp > 37.5 C (99.5 F)).    Marland Kitchen aspirin EC 81 MG tablet Take 1 tablet (81 mg total) by mouth daily. 30 tablet 3  . docusate sodium (COLACE) 100 MG capsule Take 1 capsule (100 mg total) by mouth every 12 (twelve) hours. 60 capsule 0  . fentaNYL (DURAGESIC) 25 MCG/HR Place 1 patch onto the skin every 3 (three) days. 5 patch 0  . oxyCODONE-acetaminophen (PERCOCET/ROXICET) 5-325 MG tablet Take 1 tablet by mouth every 6 (six) hours as needed for severe pain. 30 tablet 0  . polyethylene glycol (MIRALAX / GLYCOLAX) 17 g packet Take 17 g by mouth daily. 14 each 0   No current facility-administered medications for this visit.    No Known Allergies  Review of Systems  Constitutional: Positive for activity change, appetite change and unexpected weight change. Negative for chills, diaphoresis  and fever.  HENT: Negative for trouble swallowing and voice change.   Eyes: Positive for visual disturbance (Blurry).  Respiratory: Positive for shortness of breath.   Genitourinary: Positive for frequency.  Hematological: Positive for adenopathy. Does not bruise/bleed easily.    BP 100/67 (BP Location: Right Arm, Patient Position: Sitting, Cuff Size: Normal)   Pulse 84   Temp 98.1 F (36.7 C) (Skin)   Resp 20   Ht 5\' 7"  (1.702 m)   Wt 90 lb (40.8 kg)   SpO2 96% Comment: RA  BMI 14.10 kg/m  Physical Exam Vitals reviewed.  Constitutional:      General: He is not in acute distress.    Appearance: He is  ill-appearing.  HENT:     Head: Normocephalic and atraumatic.  Eyes:     General: No scleral icterus.    Extraocular Movements: Extraocular movements intact.  Cardiovascular:     Rate and Rhythm: Normal rate and regular rhythm.     Heart sounds: No murmur. No friction rub. No gallop.   Pulmonary:     Effort: No respiratory distress.     Breath sounds: Normal breath sounds. No wheezing.  Abdominal:     General: There is no distension.     Palpations: Abdomen is soft.  Musculoskeletal:     Cervical back: Neck supple.  Lymphadenopathy:     Cervical: Cervical adenopathy (Left supraclavicular node) present.  Skin:    General: Skin is warm and dry.     Comments: Tender left inguinal node  Neurological:     Mental Status: He is oriented to person, place, and time.     Cranial Nerves: No cranial nerve deficit.     Motor: Weakness (Very minimal left hand weakness) present.    Diagnostic Tests: NUCLEAR MEDICINE PET SKULL BASE TO THIGH  TECHNIQUE: 5.35 mCi F-18 FDG was injected intravenously. Full-ring PET imaging was performed from the skull base to thigh after the radiotracer. CT data was obtained and used for attenuation correction and anatomic localization.  Fasting blood glucose: 88 mg/dl  COMPARISON:  Prior CTs 03/24/2019 and 03/25/2019.  FINDINGS: Mediastinal blood pool activity: SUV max 1.4  Liver activity: SUV max 2.0  NECK:  There is a single hypermetabolic left supraclavicular lymph node measuring approximately 11 mm on image 49/4. This has an SUV max 28.9. No other hypermetabolic or enlarged cervical lymph nodes.There are no lesions of the pharyngeal mucosal space.  Incidental CT findings: Bilateral carotid atherosclerosis.  CHEST:  The bulky right perihilar mass is markedly hypermetabolic with an SUV max of 27.3. This demonstrates no central necrosis or associated bronchial obstruction and measures approximately 8.5 x 6.2 cm on image 41/8. There  is an additional right lower lobe mass along the posterior aspect of the left atrium which is hypermetabolic, measuring approximately 3.0 cm on image 51/8 (SUV max 12.3). No other hypermetabolic pulmonary nodules. There is possible hypermetabolic right hilar adenopathy adjacent to the dominant right perihilar mass. No other hypermetabolic mediastinal, hilar or axillary lymph nodes.  Incidental CT findings: Previous median sternotomy and CABG. Diffuse atherosclerosis of the aorta, great vessels and coronary arteries. Additional small nodules in the right lower lobe and lingula are stable. There is mild left lower lobe atelectasis.  ABDOMEN/PELVIS:  Again demonstrated is heterogeneous enlargement of the spleen which demonstrates diffuse hypermetabolic activity (SUV max 24.4). No hypermetabolic activity seen within the liver, pancreas or adrenal glands. There are multiple enlarged and hypermetabolic abdominopelvic lymph nodes. For example, celiac adenopathy has  an SUV max of 26.0, and left periaortic adenopathy at the level of the upper sacrum has an SUV max of 20.5. A 3.5 x 2.3 cm left inguinal node on image 178/4 has an SUV max of 29.7.  Incidental CT findings: The bladder is trabeculated. No hydronephrosis. Diffuse aortic and branch vessel atherosclerosis.  SKELETON:  There is no hypermetabolic activity to suggest osseous metastatic disease.  Incidental CT findings: Mild degenerative changes in the spine and hips.  IMPRESSION: 1. Extensive hypermetabolic adenopathy in the abdomen and pelvis with bulky right perihilar lung mass and left supraclavicular adenopathy, also hypermetabolic. 2. The heterogeneously enlarged spleen also demonstrates diffuse hypermetabolic activity. 3. No definite involvement of the liver, adrenal glands or bones. 4. The findings are best explained by a single diagnosis of lymphoma. Bronchogenic carcinoma is considered less likely. Additional  tissue sampling recommended.   Electronically Signed   By: Richardean Sale M.D.   On: 04/18/2019 14:05 I personally reviewed the CT images and concur with the findings noted above  Impression: [name] is a 64 year old man with a history of coronary disease, coronary bypass grafting in 2010, cocaine abuse, ischemic cardiomyopathy, and tobacco abuse.  He has been found to have extensive abnormalities on PET/CT with hypermetabolic areas including his right hilum, spleen, and extensive lymphadenopathy.  The most accessible node was a left inguinal node that was biopsied in December.  Unfortunately, that node was necrotic and did not yield a diagnosis.  The differential diagnosis is primarily lymphoma versus primary lung cancer with widespread metastases.  I think lymphoma is far more likely.  It is possible he could have more than one primary but more than likely this all is lymphoma.  In any event he needs a tissue diagnosis to guide therapy.  Reviewed the scans I think the best option would be to do an excisional biopsy of the left supraclavicular lymph node.  This can be done with minimal morbidity on an outpatient basis.  I discussed the possibility of a left supraclavicular lymph node biopsy with Mr. Pentecost and his sister.  I informed him of the general nature of the procedure.  They understand we would do this under local with some intravenous sedation.  We would plan to do it as an outpatient.  They understand this is diagnostic and not therapeutic.  I informed him of the indications, risk, benefits, and alternatives.  They understand the risk include those associated with sedation and local anesthesia, in addition to wound infection, lymphocele, nerve injury, and a nondiagnostic biopsy.  He accepts the risk and wishes to proceed.  Plan: Left supraclavicular lymph node biopsy on 05/07/2018  [name], MD [facility] [phone]

## 2019-05-07 ENCOUNTER — Other Ambulatory Visit: Payer: Self-pay

## 2019-05-07 ENCOUNTER — Other Ambulatory Visit (HOSPITAL_COMMUNITY)
Admission: RE | Admit: 2019-05-07 | Discharge: 2019-05-07 | Disposition: A | Discharge status: Home / Self Care | Payer: Medicaid Other | Source: Ambulatory Visit | Attending: Thoracic Surgery (Cardiothoracic Vascular Surgery) | Admitting: Thoracic Surgery (Cardiothoracic Vascular Surgery)

## 2019-05-07 ENCOUNTER — Encounter (HOSPITAL_COMMUNITY): Payer: Self-pay | Admitting: Thoracic Surgery (Cardiothoracic Vascular Surgery)

## 2019-05-07 DIAGNOSIS — Z20822 Contact with and (suspected) exposure to covid-19: Secondary | ICD-10-CM | POA: Insufficient documentation

## 2019-05-07 DIAGNOSIS — R590 Localized enlarged lymph nodes: Secondary | ICD-10-CM

## 2019-05-07 DIAGNOSIS — Z01812 Encounter for preprocedural laboratory examination: Secondary | ICD-10-CM | POA: Diagnosis present

## 2019-05-07 LAB — SARS CORONAVIRUS 2 (TAT 6-24 HRS): SARS Coronavirus 2: NEGATIVE

## 2019-05-07 NOTE — Progress Notes (Signed)
Spoke with family member Beni Turrell [phone]. Unable to obtain patient's information, instructions for DOS given.  PCP - None Cardiologist - Dr Marlou Porch  Chest x-ray - DOS 05/08/19;  03/04/19 (1V) EKG - DOS 05/08/19 Stress Test - denies ECHO - 03/06/19 Cardiac Cath - 2010  Anesthesia review: Yes  Aspirin Instructions: Follow your surgeon's instructions on when to stop aspirin prior to surgery,  If no instructions were given by your surgeon then you will need to call the office for those instructions.  STOP NOW taking any Aspirin (unless otherwise instructed by your surgeon), Aleve, Naproxen, Ibuprofen, Motrin, Advil, Goody's, BC's, all herbal medications, fish oil, and all vitamins.   Coronavirus Screening Have you experienced the following symptoms:  Cough yes/no: No Fever (>100.65F)  yes/no: No Runny nose yes/no: No Sore throat yes/no: No Difficulty breathing/shortness of breath  yes/no: No  Have you traveled in the last 14 days and where? yes/no: No  Verdene Lennert verbalized understanding of instructions that were given via phone.

## 2019-05-07 NOTE — Anesthesia Preprocedure Evaluation (Addendum)
Anesthesia Evaluation  Patient identified by MRN, date of birth, ID band Patient awake    Reviewed: Allergy & Precautions, NPO status   Airway Mallampati: II  TM Distance: >3 FB     Dental   Pulmonary Current Smoker,    breath sounds clear to auscultation       Cardiovascular hypertension, + CAD and +CHF   Rhythm:Regular Rate:Normal     Neuro/Psych    GI/Hepatic negative GI ROS, Neg liver ROS,   Endo/Other  negative endocrine ROS  Renal/GU negative Renal ROS     Musculoskeletal   Abdominal   Peds  Hematology  (+) anemia ,   Anesthesia Other Findings   Reproductive/Obstetrics                           Anesthesia Physical Anesthesia Plan  ASA: III  Anesthesia Plan: MAC   Post-op Pain Management:    Induction:   PONV Risk Score and Plan: 2 and Ondansetron and Dexamethasone  Airway Management Planned: Nasal Cannula and Simple Face Mask  Additional Equipment:   Intra-op Plan:   Post-operative Plan:   Informed Consent: I have reviewed the patients History and Physical, chart, labs and discussed the procedure including the risks, benefits and alternatives for the proposed anesthesia with the patient or authorized representative who has indicated his/her understanding and acceptance.     Dental advisory given  Plan Discussed with: CRNA and Anesthesiologist  Anesthesia Plan Comments: (Follows with cardiology for hx of CAD s/p CABG 2010 and ischemic cardiomyopathy. Last seen 7/23/230. Per note at that time, "EKG stable. Active, can walk up 2 flights of steps without chest pain or SOB."  In the interim however the pt suffered a stroke Dec 2020 with mild residual left hemiparesis and has had several admissions related to rapid decline in overall health statues due to likely new diagnosis of metastatic lung cancer vs lymphoma. He is following outpatient with neurology and oncology. He has  been found to have extensive abnormalities on PET/CT with hypermetabolic areas including his right hilum, spleen, and extensive lymphadenopathy.  Left inguinal node that was biopsied in was necrotic and did not yield a diagnosis.  Will need DOS labs and eval.  Limited echo 03/06/19 Left Ventricle: Left ventricular ejection fraction, by visual estimation,  is 35 to 40%. The left ventricle has moderately decreased function. The  left ventricle demonstrates regional wall motion abnormalities.   Right Ventricle: The right ventricular size is normal. Global RV systolic  function is has normal systolic function.   Complete echo 03/05/19: 1. EF 40-45% with global hypokinesis. There is akinesis of the apical  septum, apical inferior, and apical segments. There is no obvious thrombus  present in the LV, but would recommend a repeat contrast study to exclude  apical thrombus in the patient  with concerns for stroke.  2. Left ventricular ejection fraction, by visual estimation, is 40 to  45%. The left ventricle has mildly decreased function. There is no left  ventricular hypertrophy.  3. Apical septal segment, apical inferior segment, and apex are abnormal.  4. The left ventricle demonstrates regional wall motion abnormalities.  5. Global right ventricle has normal systolic function.The right  ventricular size is normal. No increase in right ventricular wall  thickness.  6. Left atrial size was normal.  7. Right atrial size was normal.  8. The mitral valve is myxomatous. Trivial mitral valve regurgitation.  9. The tricuspid valve is grossly  normal. Tricuspid valve regurgitation  is trivial.  10. The aortic valve is tricuspid. Aortic valve regurgitation is not  visualized. No evidence of aortic valve sclerosis or stenosis.  11. The pulmonic valve was grossly normal. Pulmonic valve regurgitation is  not visualized.  12. TR signal is inadequate for assessing pulmonary artery systolic   pressure.  13. The inferior vena cava is normal in size with greater than 50%  respiratory variability, suggesting right atrial pressure of 3 mmHg.  14. A prior study was performed on 10/17/2017.  15. Changes from prior study are noted.  16. EF slightly improved ~45% compared with prior. WMA remain unchanged)      Anesthesia Quick Evaluation

## 2019-05-07 NOTE — Progress Notes (Signed)
Anesthesia Chart Review:  Follows with cardiology for hx of CAD s/p CABG 2010 and ischemic cardiomyopathy. Last seen 7/23/230. Per note at that time, "EKG stable. Active, can walk up 2 flights of steps without chest pain or SOB."  In the interim however the pt suffered a stroke Dec 2020 with mild residual left hemiparesis and has had several admissions related to rapid decline in overall health statues due to likely new diagnosis of metastatic lung cancer vs lymphoma. He is following outpatient with neurology and oncology. He has been found to have extensive abnormalities on PET/CT with hypermetabolic areas including his right hilum, spleen, and extensive lymphadenopathy.  Left inguinal node that was biopsied in was necrotic and did not yield a diagnosis.  Will need DOS labs and eval.  Limited echo 03/06/19 Left Ventricle: Left ventricular ejection fraction, by visual estimation,  is 35 to 40%. The left ventricle has moderately decreased function. The  left ventricle demonstrates regional wall motion abnormalities.   Right Ventricle: The right ventricular size is normal. Global RV systolic  function is has normal systolic function.   Complete echo 03/05/19: 1. EF 40-45% with global hypokinesis. There is akinesis of the apical  septum, apical inferior, and apical segments. There is no obvious thrombus  present in the LV, but would recommend a repeat contrast study to exclude  apical thrombus in the patient  with concerns for stroke.  2. Left ventricular ejection fraction, by visual estimation, is 40 to  45%. The left ventricle has mildly decreased function. There is no left  ventricular hypertrophy.  3. Apical septal segment, apical inferior segment, and apex are abnormal.  4. The left ventricle demonstrates regional wall motion abnormalities.  5. Global right ventricle has normal systolic function.The right  ventricular size is normal. No increase in right ventricular wall   thickness.  6. Left atrial size was normal.  7. Right atrial size was normal.  8. The mitral valve is myxomatous. Trivial mitral valve regurgitation.  9. The tricuspid valve is grossly normal. Tricuspid valve regurgitation  is trivial.  10. The aortic valve is tricuspid. Aortic valve regurgitation is not  visualized. No evidence of aortic valve sclerosis or stenosis.  11. The pulmonic valve was grossly normal. Pulmonic valve regurgitation is  not visualized.  12. TR signal is inadequate for assessing pulmonary artery systolic  pressure.  13. The inferior vena cava is normal in size with greater than 50%  respiratory variability, suggesting right atrial pressure of 3 mmHg.  14. A prior study was performed on 10/17/2017.  15. Changes from prior study are noted.  16. EF slightly improved ~45% compared with prior. WMA remain unchanged.    Wynonia Musty [facility] Short Stay Center/Anesthesiology Phone [phone] 05/07/2019 1:00 PM

## 2019-05-08 ENCOUNTER — Ambulatory Visit (HOSPITAL_COMMUNITY): Payer: Medicaid Other

## 2019-05-08 ENCOUNTER — Other Ambulatory Visit: Payer: Self-pay

## 2019-05-08 ENCOUNTER — Ambulatory Visit (HOSPITAL_COMMUNITY): Payer: Medicaid Other | Admitting: Physician Assistant

## 2019-05-08 ENCOUNTER — Encounter (HOSPITAL_COMMUNITY)
Admission: RE | Disposition: A | Discharge status: Home / Self Care | Payer: Self-pay | Source: Ambulatory Visit | Attending: Thoracic Surgery (Cardiothoracic Vascular Surgery)

## 2019-05-08 ENCOUNTER — Encounter (HOSPITAL_COMMUNITY): Payer: Self-pay | Admitting: Thoracic Surgery (Cardiothoracic Vascular Surgery)

## 2019-05-08 ENCOUNTER — Other Ambulatory Visit: Payer: Self-pay | Admitting: Thoracic Surgery (Cardiothoracic Vascular Surgery)

## 2019-05-08 ENCOUNTER — Ambulatory Visit (HOSPITAL_COMMUNITY)
Admission: RE | Admit: 2019-05-08 | Discharge: 2019-05-08 | Disposition: A | Discharge status: Home / Self Care | Payer: Medicaid Other | Source: Ambulatory Visit | Attending: Thoracic Surgery (Cardiothoracic Vascular Surgery) | Admitting: Thoracic Surgery (Cardiothoracic Vascular Surgery)

## 2019-05-08 ENCOUNTER — Encounter: Payer: Self-pay | Admitting: *Deleted

## 2019-05-08 DIAGNOSIS — Z951 Presence of aortocoronary bypass graft: Secondary | ICD-10-CM | POA: Diagnosis not present

## 2019-05-08 DIAGNOSIS — I110 Hypertensive heart disease with heart failure: Secondary | ICD-10-CM | POA: Diagnosis not present

## 2019-05-08 DIAGNOSIS — R630 Anorexia: Secondary | ICD-10-CM | POA: Diagnosis not present

## 2019-05-08 DIAGNOSIS — R161 Splenomegaly, not elsewhere classified: Secondary | ICD-10-CM | POA: Diagnosis not present

## 2019-05-08 DIAGNOSIS — I509 Heart failure, unspecified: Secondary | ICD-10-CM | POA: Diagnosis not present

## 2019-05-08 DIAGNOSIS — Z8673 Personal history of transient ischemic attack (TIA), and cerebral infarction without residual deficits: Secondary | ICD-10-CM | POA: Diagnosis not present

## 2019-05-08 DIAGNOSIS — R5383 Other fatigue: Secondary | ICD-10-CM | POA: Insufficient documentation

## 2019-05-08 DIAGNOSIS — I2510 Atherosclerotic heart disease of native coronary artery without angina pectoris: Secondary | ICD-10-CM | POA: Diagnosis not present

## 2019-05-08 DIAGNOSIS — D649 Anemia, unspecified: Secondary | ICD-10-CM | POA: Diagnosis not present

## 2019-05-08 DIAGNOSIS — F1410 Cocaine abuse, uncomplicated: Secondary | ICD-10-CM | POA: Diagnosis not present

## 2019-05-08 DIAGNOSIS — Z79899 Other long term (current) drug therapy: Secondary | ICD-10-CM | POA: Diagnosis not present

## 2019-05-08 DIAGNOSIS — R222 Localized swelling, mass and lump, trunk: Secondary | ICD-10-CM

## 2019-05-08 DIAGNOSIS — R590 Localized enlarged lymph nodes: Secondary | ICD-10-CM | POA: Diagnosis present

## 2019-05-08 DIAGNOSIS — F17210 Nicotine dependence, cigarettes, uncomplicated: Secondary | ICD-10-CM | POA: Diagnosis not present

## 2019-05-08 DIAGNOSIS — R9431 Abnormal electrocardiogram [ECG] [EKG]: Secondary | ICD-10-CM | POA: Diagnosis not present

## 2019-05-08 DIAGNOSIS — Z7982 Long term (current) use of aspirin: Secondary | ICD-10-CM | POA: Insufficient documentation

## 2019-05-08 DIAGNOSIS — I255 Ischemic cardiomyopathy: Secondary | ICD-10-CM | POA: Diagnosis not present

## 2019-05-08 DIAGNOSIS — R0602 Shortness of breath: Secondary | ICD-10-CM | POA: Diagnosis not present

## 2019-05-08 DIAGNOSIS — Z859 Personal history of malignant neoplasm, unspecified: Secondary | ICD-10-CM | POA: Diagnosis not present

## 2019-05-08 DIAGNOSIS — C8332 Diffuse large B-cell lymphoma, intrathoracic lymph nodes: Secondary | ICD-10-CM | POA: Insufficient documentation

## 2019-05-08 DIAGNOSIS — R599 Enlarged lymph nodes, unspecified: Secondary | ICD-10-CM

## 2019-05-08 DIAGNOSIS — R918 Other nonspecific abnormal finding of lung field: Secondary | ICD-10-CM

## 2019-05-08 HISTORY — PX: SUPRACLAVICAL NODE BIOPSY: SHX5165

## 2019-05-08 LAB — TYPE AND SCREEN
ABO/RH(D): O POS
Antibody Screen: NEGATIVE

## 2019-05-08 LAB — COMPREHENSIVE METABOLIC PANEL
ALT: 12 U/L (ref 0–44)
AST: 22 U/L (ref 15–41)
Albumin: 3 g/dL — ABNORMAL LOW (ref 3.5–5.0)
Alkaline Phosphatase: 81 U/L (ref 38–126)
Anion gap: 17 — ABNORMAL HIGH (ref 5–15)
BUN: 17 mg/dL (ref 8–23)
CO2: 24 mmol/L (ref 22–32)
Calcium: 10.2 mg/dL (ref 8.9–10.3)
Chloride: 95 mmol/L — ABNORMAL LOW (ref 98–111)
Creatinine, Ser: 0.7 mg/dL (ref 0.61–1.24)
GFR calc Af Amer: >60 mL/min (ref >60)
GFR calc non Af Amer: >60 mL/min (ref >60)
Glucose, Bld: 98 mg/dL (ref 70–99)
Potassium: 4.1 mmol/L (ref 3.5–5.1)
Sodium: 136 mmol/L (ref 135–145)
Total Bilirubin: 0.8 mg/dL (ref 0.3–1.2)
Total Protein: 8.1 g/dL (ref 6.5–8.1)

## 2019-05-08 LAB — PROTIME-INR
INR: 1.1 (ref 0.8–1.2)
Prothrombin Time: 14.4 seconds (ref 11.4–15.2)

## 2019-05-08 LAB — CBC
HCT: 37.8 % — ABNORMAL LOW (ref 39.0–52.0)
Hemoglobin: 12.4 g/dL — ABNORMAL LOW (ref 13.0–17.0)
MCH: 29.6 pg (ref 26.0–34.0)
MCHC: 32.8 g/dL (ref 30.0–36.0)
MCV: 90.2 fL (ref 80.0–100.0)
Platelets: 567 10*3/uL — ABNORMAL HIGH (ref 150–400)
RBC: 4.19 MIL/uL — ABNORMAL LOW (ref 4.22–5.81)
RDW: 15.3 % (ref 11.5–15.5)
WBC: 8.9 10*3/uL (ref 4.0–10.5)
nRBC: 0 % (ref 0.0–0.2)

## 2019-05-08 LAB — APTT: aPTT: 34 seconds (ref 24–36)

## 2019-05-08 SURGERY — BIOPSY, LYMPH NODE, SUPRACLAVICULAR
Anesthesia: Monitor Anesthesia Care | Site: Chest | Laterality: Left

## 2019-05-08 MED ORDER — ONDANSETRON HCL 4 MG/2ML IJ SOLN
INTRAMUSCULAR | Status: DC | PRN
  Administered 2019-05-08: 4 mg via INTRAVENOUS

## 2019-05-08 MED ORDER — DEXAMETHASONE SODIUM PHOSPHATE 10 MG/ML IJ SOLN
INTRAMUSCULAR | Status: AC
  Filled 2019-05-08: qty 1

## 2019-05-08 MED ORDER — MIDAZOLAM HCL 2 MG/2ML IJ SOLN
INTRAMUSCULAR | Status: AC
  Filled 2019-05-08: qty 2

## 2019-05-08 MED ORDER — LIDOCAINE-EPINEPHRINE 1 %-1:100000 IJ SOLN
INTRAMUSCULAR | Status: AC
  Filled 2019-05-08: qty 1

## 2019-05-08 MED ORDER — LIDOCAINE 2% (20 MG/ML) 5 ML SYRINGE
INTRAMUSCULAR | Status: AC
  Filled 2019-05-08: qty 5

## 2019-05-08 MED ORDER — LIDOCAINE-EPINEPHRINE 1 %-1:100000 IJ SOLN
INTRAMUSCULAR | Status: DC | PRN
  Administered 2019-05-08: 21 mL

## 2019-05-08 MED ORDER — CEFAZOLIN SODIUM-DEXTROSE 2-4 GM/100ML-% IV SOLN
2.0000 g | INTRAVENOUS | Status: AC
  Administered 2019-05-08: 2 g via INTRAVENOUS
  Filled 2019-05-08: qty 100

## 2019-05-08 MED ORDER — PROPOFOL 10 MG/ML IV BOLUS
INTRAVENOUS | Status: AC
  Filled 2019-05-08: qty 40

## 2019-05-08 MED ORDER — PHENYLEPHRINE 40 MCG/ML (10ML) SYRINGE FOR IV PUSH (FOR BLOOD PRESSURE SUPPORT)
PREFILLED_SYRINGE | INTRAVENOUS | Status: AC
  Filled 2019-05-08: qty 10

## 2019-05-08 MED ORDER — SUCCINYLCHOLINE CHLORIDE 200 MG/10ML IV SOSY
PREFILLED_SYRINGE | INTRAVENOUS | Status: AC
  Filled 2019-05-08: qty 10

## 2019-05-08 MED ORDER — OXYCODONE-ACETAMINOPHEN 5-325 MG PO TABS: 1.0000 | ORAL_TABLET | Freq: Four times a day (QID) | ORAL | 0 refills | Status: DC | PRN

## 2019-05-08 MED ORDER — ROCURONIUM BROMIDE 10 MG/ML (PF) SYRINGE
PREFILLED_SYRINGE | INTRAVENOUS | Status: AC
  Filled 2019-05-08: qty 10

## 2019-05-08 MED ORDER — ONDANSETRON HCL 4 MG/2ML IJ SOLN
INTRAMUSCULAR | Status: AC
  Filled 2019-05-08: qty 2

## 2019-05-08 MED ORDER — FENTANYL CITRATE (PF) 250 MCG/5ML IJ SOLN
INTRAMUSCULAR | Status: AC
  Filled 2019-05-08: qty 5

## 2019-05-08 MED ORDER — LACTATED RINGERS IV SOLN: INTRAVENOUS | Status: DC | PRN

## 2019-05-08 MED ORDER — FENTANYL CITRATE (PF) 250 MCG/5ML IJ SOLN
INTRAMUSCULAR | Status: DC | PRN
  Administered 2019-05-08 (×3): 25 ug via INTRAVENOUS

## 2019-05-08 MED ORDER — PHENYLEPHRINE HCL (PRESSORS) 10 MG/ML IV SOLN
INTRAVENOUS | Status: DC | PRN
  Administered 2019-05-08 (×2): 40 ug via INTRAVENOUS

## 2019-05-08 MED ORDER — PROPOFOL 500 MG/50ML IV EMUL
INTRAVENOUS | Status: DC | PRN
  Administered 2019-05-08: 100 ug/kg/min via INTRAVENOUS

## 2019-05-08 MED ORDER — FENTANYL CITRATE (PF) 100 MCG/2ML IJ SOLN: 25.0000 ug | INTRAMUSCULAR | Status: DC | PRN

## 2019-05-08 MED ORDER — PROPOFOL 10 MG/ML IV BOLUS
INTRAVENOUS | Status: DC | PRN
  Administered 2019-05-08: 20 mg via INTRAVENOUS
  Administered 2019-05-08: 30 mg via INTRAVENOUS
  Administered 2019-05-08 (×2): 20 mg via INTRAVENOUS

## 2019-05-08 MED ORDER — LACTATED RINGERS IV SOLN: INTRAVENOUS | Status: DC

## 2019-05-08 SURGICAL SUPPLY — 38 items
ADH SKN CLS APL DERMABOND .7 (GAUZE/BANDAGES/DRESSINGS) ×1
BLADE CLIPPER SURG (BLADE) ×1 IMPLANT
CANISTER SUCT 3000ML PPV (MISCELLANEOUS) ×3 IMPLANT
CLIP VESOCCLUDE MED 6/CT (CLIP) ×3 IMPLANT
CLIP VESOCCLUDE SM WIDE 6/CT (CLIP) ×7 IMPLANT
CNTNR URN SCR LID CUP LEK RST (MISCELLANEOUS) ×2 IMPLANT
CONT SPEC 4OZ STRL OR WHT (MISCELLANEOUS) ×6
COVER SURGICAL LIGHT HANDLE (MISCELLANEOUS) ×4 IMPLANT
DERMABOND ADVANCED (GAUZE/BANDAGES/DRESSINGS) ×2
DERMABOND ADVANCED .7 DNX12 (GAUZE/BANDAGES/DRESSINGS) ×1 IMPLANT
DRAPE CHEST BREAST 15X10 FENES (DRAPES) ×3 IMPLANT
ELECT CAUTERY BLADE 6.4 (BLADE) ×3 IMPLANT
ELECT REM PT RETURN 9FT ADLT (ELECTROSURGICAL) ×3
ELECTRODE REM PT RTRN 9FT ADLT (ELECTROSURGICAL) ×1 IMPLANT
GAUZE SPONGE 4X4 12PLY STRL (GAUZE/BANDAGES/DRESSINGS) ×1 IMPLANT
GLOVE SURG SIGNA 7.5 PF LTX (GLOVE) ×3 IMPLANT
GOWN STRL REUS W/ TWL XL LVL3 (GOWN DISPOSABLE) ×1 IMPLANT
GOWN STRL REUS W/TWL XL LVL3 (GOWN DISPOSABLE) ×3
HEMOSTAT SURGICEL 2X14 (HEMOSTASIS) IMPLANT
KIT BASIN OR (CUSTOM PROCEDURE TRAY) ×3 IMPLANT
KIT TURNOVER KIT B (KITS) ×3 IMPLANT
NEEDLE 22X1 1/2 (OR ONLY) (NEEDLE) ×2 IMPLANT
NS IRRIG 1000ML POUR BTL (IV SOLUTION) ×3 IMPLANT
PACK GENERAL/GYN (CUSTOM PROCEDURE TRAY) ×3 IMPLANT
PAD ARMBOARD 7.5X6 YLW CONV (MISCELLANEOUS) ×6 IMPLANT
SPONGE INTESTINAL PEANUT (DISPOSABLE) ×3 IMPLANT
SUT SILK 2 0 (SUTURE) ×3
SUT SILK 2-0 18XBRD TIE 12 (SUTURE) ×1 IMPLANT
SUT VIC AB 2-0 CT1 27 (SUTURE)
SUT VIC AB 2-0 CT1 TAPERPNT 27 (SUTURE) IMPLANT
SUT VIC AB 3-0 SH 27 (SUTURE) ×3
SUT VIC AB 3-0 SH 27X BRD (SUTURE) ×1 IMPLANT
SUT VIC AB 3-0 X1 27 (SUTURE) IMPLANT
SUT VICRYL 4-0 PS2 18IN ABS (SUTURE) ×3 IMPLANT
SYR CONTROL 10ML LL (SYRINGE) ×2 IMPLANT
TOWEL GREEN STERILE (TOWEL DISPOSABLE) ×3 IMPLANT
TOWEL GREEN STERILE FF (TOWEL DISPOSABLE) ×3 IMPLANT
WATER STERILE IRR 1000ML POUR (IV SOLUTION) ×3 IMPLANT

## 2019-05-08 NOTE — Transfer of Care (Signed)
Immediate Anesthesia Transfer of Care Note  Patient: [name]  Procedure(s) Performed: SUPRACLAVICAL NODE BIOPSY (Left Chest)  Patient Location: PACU  Anesthesia Type:MAC  Level of Consciousness: awake, alert  and oriented  Airway & Oxygen Therapy: Patient Spontanous Breathing and Patient connected to face mask oxygen  Post-op Assessment: Report given to RN, Post -op Vital signs reviewed and stable and Patient moving all extremities X 4  Post vital signs: Reviewed and stable  Last Vitals:  Vitals Value Taken Time  BP    Temp    Pulse 96 05/08/19 0914  Resp    SpO2 98 % 05/08/19 0914  Vitals shown include unvalidated device data.  Last Pain: There were no vitals filed for this visit.       Complications: No apparent anesthesia complications

## 2019-05-08 NOTE — Op Note (Signed)
NAME: [name], WISSING MEDICAL RECORD [id number] ACCOUNT [id number] DATE OF BIRTH:Jun 18, 1955 FACILITY: [facility] LOCATION: [facility] PHYSICIAN:Earnesteen Birnie Chaya Jan, MD  OPERATIVE REPORT  DATE OF PROCEDURE:  05/08/2019  PREOPERATIVE DIAGNOSIS:  Right hilar mass and diffuse adenopathy.  POSTOPERATIVE DIAGNOSIS:  Right hilar mass and diffuse adenopathy.  PROCEDURE:  Left supraclavicular lymph node biopsy.  SURGEON:  [place] Charon, MD  ASSISTANT:  None.  ANESTHESIA:  Local with intravenous sedation.  FINDINGS:  Two relatively normal appearing nodes overlying a grossly abnormal node measuring approximately 1.5 cm in diameter.  CLINICAL NOTE:  The patient is a 64 year old gentleman who recently presented with left inguinal pain.  He was found to have adenopathy.  Unfortunately, needle biopsy was nondiagnostic.  A PET CT showed extensive disease consistent with widely metastatic  cancer with a differential being a primary lung cancer versus lymphoma.  The PET did show a markedly hypermetabolic left supraclavicular lymph node.  He was advised to undergo lymph node biopsy for diagnostic purposes.  The indications, risks, benefits,  and alternatives were discussed in detail with the patient.  He understood and accepted the risks and agreed to proceed.  DESCRIPTION OF PROCEDURE:  [name] was brought to the operating room on 05/08/2019.  He was given intravenous antibiotics.  He was given intravenous sedation and monitored by the anesthesia service.  The neck and chest were prepped and draped in the  usual sterile fashion.  A timeout was performed.  Local anesthesia was achieved with 8 mL of 1% lidocaine.  After ensuring adequate local anesthetic effect, an incision was made in the left supraclavicular fossa.  It was carried through the skin and subcutaneous tissue.  The  platysma was divided with cautery.  The sternocleidomastoid muscle was retracted anteriorly.  Two lymph nodes were  encountered initially.  These were both fleshy, relatively normal appearing, although slightly enlarged nodes.  Below those nodes, there was a  palpable, hard, grossly abnormal node.  The 2 superficial nodes were removed and then the deep node was removed.  Feeding vessels and lymphatics were clipped.  The node was removed intact.  All 3 nodes were sent as a single specimen for permanent  pathology.  The wound was irrigated with saline.  A final inspection was made for hemostasis.  The closure was accomplished in 2 layers with a 3-0 Vicryl suture in the platysma, followed by a 4-0 Vicryl subcuticular suture.  Dermabond was applied.  The  patient was taken from the operating room to the [facility] in good condition.  All sponge, needle and instrument counts were correct at the end of the procedure.  CN/NUANCE  D:05/08/2019 T:05/08/2019 JOB:[id number]

## 2019-05-08 NOTE — Interval H&P Note (Signed)
History and Physical Interval Note:  05/08/2019 8:06 AM  [name]  has presented today for surgery, with the diagnosis of LEFT SUPRACLAVICULAR ADENOPATHY.  The various methods of treatment have been discussed with the patient and family. After consideration of risks, benefits and other options for treatment, the patient has consented to  Procedure(s): SUPRACLAVICAL NODE BIOPSY (Left) as a surgical intervention.  The patient's history has been reviewed, patient examined, no change in status, stable for surgery.  I have reviewed the patient's chart and labs.  Questions were answered to the patient's satisfaction.     [name]

## 2019-05-08 NOTE — Progress Notes (Signed)
Attempted to completed patients pre-op assessment this morning, patient is not cooperative this morning. RN entered patients room and introduced self and explained that she was there to complete the pre-op assessment and go over the consent form for the procedure. Asked patient if he could tell RN his name and birthday. Attempted to verify patient name and birthday, patient stated "I don't understand why you don't know who I am."  Attempted to explain the reasoning behind why we verify patients name and birthday.  Patient interrupted RN during explanation and stated that "I am in pain and I do not want to answer any questions."  Attempted to ask patient if he could tell me what procedure he was having done, because I would need his consent for Dr. Roxan Hockey to perform the procedure. Patient stated that he does not know what he is having done today and that all he is aware of is that he is here to have surgery.  Patient did state that he has not had anything to eat or drink since yesterday and that he did not take any medications this morning.    7:20 AM Asked another RN to see if she can complete the pre-op assessment.  Rn is with patient at this time

## 2019-05-08 NOTE — Progress Notes (Signed)
Oncology Nurse Navigator Documentation  Oncology Nurse Navigator Flowsheets 05/08/2019  Abnormal Finding Date -  Diagnosis Status -  Navigator Follow Up Date: -  Navigator Follow Up Reason: -  Navigator Location [facility]  Navigator Encounter Type Other/Dr. Julien Nordmann updated me that he would like to see the patient next week. I updated scheduling to call and schedule patient to be seen on 05/13/19.   Telephone -  [facility] Date -  Multidisiplinary Clinic Type -  Patient Visit Type -  Treatment Phase Pre-Tx/Tx Discussion  Barriers/Navigation Needs Coordination of Care  Education -  Interventions Coordination of Care  Acuity Level 2-Minimal Needs (1-2 Barriers Identified)  Coordination of Care Other  Education Method -  Time Spent with Patient 15

## 2019-05-08 NOTE — Anesthesia Postprocedure Evaluation (Signed)
Anesthesia Post Note  Patient: [name]  Procedure(s) Performed: SUPRACLAVICAL NODE BIOPSY (Left Chest)     Patient location during evaluation: PACU Anesthesia Type: MAC Level of consciousness: awake Pain management: pain level controlled Vital Signs Assessment: post-procedure vital signs reviewed and stable Respiratory status: spontaneous breathing Cardiovascular status: stable Postop Assessment: no apparent nausea or vomiting Anesthetic complications: no    Last Vitals:  Vitals:   05/08/19 0614  BP: 128/81  Resp: 16  Temp: 37.2 C    Last Pain: There were no vitals filed for this visit.               Aziel Morgan

## 2019-05-08 NOTE — Progress Notes (Signed)
Mr. Cislo informed me his prescription for percocet was never refilled.   New prescription sent to pharmacy.  Revonda Standard Roxan Hockey, MD [facility] [phone]

## 2019-05-08 NOTE — Brief Op Note (Signed)
05/08/2019  9:14 AM  PATIENT:  [name]  64 y.o. male  PRE-OPERATIVE DIAGNOSIS:  LEFT SUPRACLAVICULAR ADENOPATHY  POST-OPERATIVE DIAGNOSIS:  LEFT SUPRACLAVICULAR ADENOPATHY  PROCEDURE:   Left Supraclavicular lymph node biopsy  SURGEON:  Surgeon(s) and Role:    [name], MD - Primary  PHYSICIAN ASSISTANT:   ASSISTANTS: none   ANESTHESIA:   local and IV sedation  EBL: minimal  BLOOD ADMINISTERED:none  DRAINS: none   LOCAL MEDICATIONS USED:  LIDOCAINE  and Amount: 8 ml  SPECIMEN:  Source of Specimen:  supraclavicular nodes  DISPOSITION OF SPECIMEN:  PATHOLOGY  COUNTS:  YES  TOURNIQUET:  * No tourniquets in log *  DICTATION: .Other Dictation: Dictation Number -  PLAN OF CARE: Discharge to home after PACU  PATIENT DISPOSITION:  PACU - hemodynamically stable.   Delay start of Pharmacological VTE agent (>24hrs) due to surgical blood loss or risk of bleeding: not applicable

## 2019-05-09 ENCOUNTER — Telehealth: Payer: Self-pay | Admitting: Internal Medicine

## 2019-05-09 ENCOUNTER — Encounter: Payer: Self-pay | Admitting: *Deleted

## 2019-05-09 NOTE — Telephone Encounter (Signed)
Scheduled per 2/11 sch msg. Called and spoke with Verdene Lennert (niece), confirmed 2/16 appt

## 2019-05-09 NOTE — Progress Notes (Signed)
Oncology Nurse Navigator Documentation  Oncology Nurse Navigator Flowsheets 05/09/2019  Abnormal Finding Date -  Diagnosis Status -  Navigator Follow Up Date: -  Navigator Follow Up Reason: -  Navigator Location [facility]  Navigator Encounter Type Other/I updated scheduling team to call and schedule patient on 2/16 at 11:30 with Dr. Julien Nordmann  Telephone -  Multidisiplinary Clinic Date -  Multidisiplinary Clinic Type -  Patient Visit Type -  Treatment Phase Pre-Tx/Tx Discussion  Barriers/Navigation Needs Coordination of Care  Education -  Interventions Coordination of Care  Acuity Level 2-Minimal Needs (1-2 Barriers Identified)  Coordination of Care Other  Education Method -  Time Spent with Patient 15

## 2019-05-12 ENCOUNTER — Other Ambulatory Visit: Payer: Self-pay | Admitting: Medical Oncology

## 2019-05-12 DIAGNOSIS — R918 Other nonspecific abnormal finding of lung field: Secondary | ICD-10-CM

## 2019-05-13 ENCOUNTER — Encounter: Payer: Self-pay | Admitting: Internal Medicine

## 2019-05-13 ENCOUNTER — Inpatient Hospital Stay: Payer: Medicaid Other | Attending: Internal Medicine | Admitting: Internal Medicine

## 2019-05-13 ENCOUNTER — Other Ambulatory Visit: Payer: Self-pay

## 2019-05-13 ENCOUNTER — Inpatient Hospital Stay: Payer: Medicaid Other

## 2019-05-13 ENCOUNTER — Telehealth: Payer: Self-pay | Admitting: Internal Medicine

## 2019-05-13 VITALS — BP 90/64 | HR 78 | Temp 98.2°F | Resp 17 | Ht 67.0 in | Wt 103.5 lb

## 2019-05-13 DIAGNOSIS — Z5111 Encounter for antineoplastic chemotherapy: Secondary | ICD-10-CM | POA: Insufficient documentation

## 2019-05-13 DIAGNOSIS — R0602 Shortness of breath: Secondary | ICD-10-CM | POA: Diagnosis not present

## 2019-05-13 DIAGNOSIS — R918 Other nonspecific abnormal finding of lung field: Secondary | ICD-10-CM

## 2019-05-13 DIAGNOSIS — R079 Chest pain, unspecified: Secondary | ICD-10-CM | POA: Diagnosis not present

## 2019-05-13 DIAGNOSIS — Z79899 Other long term (current) drug therapy: Secondary | ICD-10-CM | POA: Insufficient documentation

## 2019-05-13 DIAGNOSIS — R109 Unspecified abdominal pain: Secondary | ICD-10-CM | POA: Insufficient documentation

## 2019-05-13 DIAGNOSIS — Z5112 Encounter for antineoplastic immunotherapy: Secondary | ICD-10-CM | POA: Insufficient documentation

## 2019-05-13 DIAGNOSIS — I2510 Atherosclerotic heart disease of native coronary artery without angina pectoris: Secondary | ICD-10-CM | POA: Diagnosis not present

## 2019-05-13 DIAGNOSIS — R531 Weakness: Secondary | ICD-10-CM | POA: Insufficient documentation

## 2019-05-13 DIAGNOSIS — F17200 Nicotine dependence, unspecified, uncomplicated: Secondary | ICD-10-CM

## 2019-05-13 DIAGNOSIS — R161 Splenomegaly, not elsewhere classified: Secondary | ICD-10-CM | POA: Diagnosis not present

## 2019-05-13 DIAGNOSIS — I6523 Occlusion and stenosis of bilateral carotid arteries: Secondary | ICD-10-CM | POA: Diagnosis not present

## 2019-05-13 DIAGNOSIS — C8338 Diffuse large B-cell lymphoma, lymph nodes of multiple sites: Secondary | ICD-10-CM | POA: Insufficient documentation

## 2019-05-13 DIAGNOSIS — R05 Cough: Secondary | ICD-10-CM | POA: Insufficient documentation

## 2019-05-13 DIAGNOSIS — I255 Ischemic cardiomyopathy: Secondary | ICD-10-CM | POA: Insufficient documentation

## 2019-05-13 DIAGNOSIS — I10 Essential (primary) hypertension: Secondary | ICD-10-CM

## 2019-05-13 DIAGNOSIS — Z951 Presence of aortocoronary bypass graft: Secondary | ICD-10-CM | POA: Diagnosis not present

## 2019-05-13 DIAGNOSIS — Z5189 Encounter for other specified aftercare: Secondary | ICD-10-CM | POA: Insufficient documentation

## 2019-05-13 DIAGNOSIS — Z7189 Other specified counseling: Secondary | ICD-10-CM | POA: Insufficient documentation

## 2019-05-13 DIAGNOSIS — R634 Abnormal weight loss: Secondary | ICD-10-CM | POA: Diagnosis not present

## 2019-05-13 DIAGNOSIS — Z7982 Long term (current) use of aspirin: Secondary | ICD-10-CM | POA: Diagnosis not present

## 2019-05-13 DIAGNOSIS — K5900 Constipation, unspecified: Secondary | ICD-10-CM | POA: Diagnosis not present

## 2019-05-13 DIAGNOSIS — C8590 Non-Hodgkin lymphoma, unspecified, unspecified site: Secondary | ICD-10-CM | POA: Insufficient documentation

## 2019-05-13 LAB — CBC WITH DIFFERENTIAL (CANCER CENTER ONLY)
Abs Immature Granulocytes: 0.02 10*3/uL (ref 0.00–0.07)
Basophils Absolute: 0 10*3/uL (ref 0.0–0.1)
Basophils Relative: 1 %
Eosinophils Absolute: 0.1 10*3/uL (ref 0.0–0.5)
Eosinophils Relative: 2 %
HCT: 35.4 % — ABNORMAL LOW (ref 39.0–52.0)
Hemoglobin: 11.4 g/dL — ABNORMAL LOW (ref 13.0–17.0)
Immature Granulocytes: 0 %
Lymphocytes Relative: 13 %
Lymphs Abs: 0.7 10*3/uL (ref 0.7–4.0)
MCH: 29.1 pg (ref 26.0–34.0)
MCHC: 32.2 g/dL (ref 30.0–36.0)
MCV: 90.3 fL (ref 80.0–100.0)
Monocytes Absolute: 0.6 10*3/uL (ref 0.1–1.0)
Monocytes Relative: 11 %
Neutro Abs: 4 10*3/uL (ref 1.7–7.7)
Neutrophils Relative %: 73 %
Platelet Count: 527 10*3/uL — ABNORMAL HIGH (ref 150–400)
RBC: 3.92 MIL/uL — ABNORMAL LOW (ref 4.22–5.81)
RDW: 15 % (ref 11.5–15.5)
WBC Count: 5.4 10*3/uL (ref 4.0–10.5)
nRBC: 0 % (ref 0.0–0.2)

## 2019-05-13 LAB — CMP (CANCER CENTER ONLY)
ALT: 6 U/L (ref 0–44)
AST: 11 U/L — ABNORMAL LOW (ref 15–41)
Albumin: 2.7 g/dL — ABNORMAL LOW (ref 3.5–5.0)
Alkaline Phosphatase: 85 U/L (ref 38–126)
Anion gap: 11 (ref 5–15)
BUN: 17 mg/dL (ref 8–23)
CO2: 28 mmol/L (ref 22–32)
Calcium: 9.7 mg/dL (ref 8.9–10.3)
Chloride: 100 mmol/L (ref 98–111)
Creatinine: 0.8 mg/dL (ref 0.61–1.24)
GFR, Est AFR Am: >60 mL/min (ref >60)
GFR, Estimated: >60 mL/min (ref >60)
Glucose, Bld: 83 mg/dL (ref 70–99)
Potassium: 4.3 mmol/L (ref 3.5–5.1)
Sodium: 139 mmol/L (ref 135–145)
Total Bilirubin: 0.4 mg/dL (ref 0.3–1.2)
Total Protein: 7.8 g/dL (ref 6.5–8.1)

## 2019-05-13 MED ORDER — PREDNISONE 50 MG PO TABS: ORAL_TABLET | ORAL | 0 refills | Status: DC

## 2019-05-13 MED ORDER — FENTANYL 25 MCG/HR TD PT72: 1.0000 | MEDICATED_PATCH | TRANSDERMAL | 0 refills | Status: DC

## 2019-05-13 MED ORDER — ALLOPURINOL 100 MG PO TABS: 100.0000 mg | ORAL_TABLET | Freq: Two times a day (BID) | ORAL | 2 refills | Status: DC

## 2019-05-13 MED ORDER — PROCHLORPERAZINE MALEATE 10 MG PO TABS: 10.0000 mg | ORAL_TABLET | Freq: Four times a day (QID) | ORAL | 0 refills | Status: DC | PRN

## 2019-05-13 NOTE — Telephone Encounter (Signed)
Scheduled appt per 2/16 los- gave pt AVS and calender per los.

## 2019-05-13 NOTE — Progress Notes (Signed)
[facility] [phone]   [phone]  OFFICE PROGRESS NOTE  Medicine, [facility] [address] 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    PRIOR THERAPY: None.  CURRENT THERAPY: Systemic chemotherapy with R-CHOP every 3 weeks.  First dose May 20, 2019.  INTERVAL HISTORY: [name] 64 y.o. male returns to the clinic today for follow-up visit accompanied by his sister.  His niece Verdene Lennert was also available by phone during the visit.  The patient continues to complain of increasing fatigue and weakness as well as abdominal pain and weight loss.  He has no current nausea and vomiting and no diarrhea.  The patient continues to have mild left-sided chest pain as well as shortness of breath with exertion with cough and no hemoptysis.  He denied having any fever or chills.  He has no headache or visual changes.  He recently underwent left supraclavicular lymph node biopsy by Dr. Roxan Hockey on May 08, 2019.  The preliminary diagnosis from pathology is large B cell non-Hodgkin lymphoma but the final report is still pending.  The patient is here today for evaluation and discussion of his treatment options.   MEDICAL HISTORY: Past Medical History:  Diagnosis Date  . CAD (coronary artery disease)    a. 04/2008 Cath: LM nl, LAD 50p, 49m, LCX min irregs, RCA 50p/m, 40d, RPDA 60-70ost; b. 04/2008 CABG x 4: LIMA->LAD, VG->D2, VG->RPDA->RPL.  . Cancer ([place])   . Cocaine abuse ([place])    a. Quit early 2019.  . Ischemic cardiomyopathy    a. LV gram: EF 30-35%,  apical/periapical AK.  . Tobacco abuse     ALLERGIES:  has No Known Allergies.  MEDICATIONS:  Current Outpatient Medications  Medication Sig Dispense Refill  . acetaminophen (TYLENOL) 325 MG tablet Take 2 tablets (650 mg total) by mouth every 4 (four) hours as needed for mild pain (or temp > 37.5 C (99.5 F)).    Marland Kitchen aspirin EC 81 MG tablet Take 1 tablet (81 mg total) by mouth daily. 30 tablet 3  . docusate sodium (COLACE) 100 MG capsule Take 1 capsule (100 mg total) by mouth every 12 (twelve) hours. 60 capsule 0  . fentaNYL (DURAGESIC) 25 MCG/HR Place 1 patch onto the skin every 3 (three) days. 5 patch 0  . oxyCODONE-acetaminophen (PERCOCET/ROXICET) 5-325 MG tablet Take 1 tablet by mouth every 6 (six) hours as needed for severe pain. 30 tablet 0  . polyethylene glycol (MIRALAX / GLYCOLAX) 17 g packet Take 17 g by mouth daily. 14 each 0   No current facility-administered medications for this visit.    SURGICAL HISTORY:  Past Surgical History:  Procedure Laterality Date  . BYPASS GRAFT  2010  . CARDIAC SURGERY  2010  . SUPRACLAVICAL NODE BIOPSY Left 05/08/2019   Procedure: SUPRACLAVICAL NODE BIOPSY;  Surgeon: [name], MD;  Location: [facility] OR;  Service: Thoracic;  Laterality: Left;    REVIEW OF SYSTEMS:  Constitutional: positive for anorexia, fatigue and weight loss Eyes: negative Ears, nose, mouth, throat, and face: negative Respiratory: positive for dyspnea on exertion and pleurisy/chest pain Cardiovascular: negative  Gastrointestinal: positive for abdominal pain and constipation Genitourinary:negative Integument/breast: negative Hematologic/lymphatic: negative Musculoskeletal:positive for muscle weakness Neurological: negative Behavioral/Psych: negative Endocrine: negative Allergic/Immunologic: negative   PHYSICAL EXAMINATION: General appearance: alert, cooperative, fatigued and no distress Head: Normocephalic, without obvious abnormality, atraumatic Neck: no  adenopathy, no JVD, supple, symmetrical, trachea midline and thyroid not enlarged, symmetric, no tenderness/mass/nodules Lymph nodes: Cervical, supraclavicular, and axillary nodes normal. Resp: diminished breath sounds LLL and dullness to percussion LLL Back: symmetric, no curvature. ROM normal. No CVA tenderness. Cardio: regular rate and rhythm, S1, S2 normal, no murmur, click, rub or gallop GI: soft, non-tender; bowel sounds normal; no masses,  no organomegaly Extremities: extremities normal, atraumatic, no cyanosis or edema Neurologic: Alert and oriented X 3, normal strength and tone. Normal symmetric reflexes. Normal coordination and gait  ECOG PERFORMANCE STATUS: 2 - Symptomatic, <50% confined to bed  Blood pressure 90/64, pulse 78, temperature 98.2 F (36.8 C), temperature source Temporal, resp. rate 17, height 5\' 7"  (1.702 m), weight 103 lb 8 oz (46.9 kg), SpO2 100 %.  LABORATORY DATA: Lab Results  Component Value Date   WBC 5.4 05/13/2019   HGB 11.4 (L) 05/13/2019   HCT 35.4 (L) 05/13/2019   MCV 90.3 05/13/2019   PLT 527 (H) 05/13/2019      Chemistry      Component Value Date/Time   NA 139 05/13/2019 1052   K 4.3 05/13/2019 1052   CL 100 05/13/2019 1052   CO2 28 05/13/2019 1052   BUN 17 05/13/2019 1052   CREATININE 0.80 05/13/2019 1052      Component Value Date/Time   CALCIUM 9.7 05/13/2019 1052   ALKPHOS 85 05/13/2019 1052   AST 11 (L) 05/13/2019 1052   ALT 6 05/13/2019 1052   BILITOT 0.4 05/13/2019 1052       RADIOGRAPHIC STUDIES: DG Chest 2 View  Result Date: 05/08/2019 CLINICAL DATA:  Preop testing EXAM: CHEST - 2 VIEW COMPARISON:  03/04/2019 FINDINGS: Grossly stable large mass at the right base. Normal heart size with stable aortic tortuosity. There has been CABG. Mild scar-like streaky density at the bases. IMPRESSION: No acute finding when compared with prior. Electronically Signed   By: Monte Fantasia M.D.   On: 05/08/2019 06:34   NM PET Image Initial  (PI) Skull Base To Thigh  Result Date: 04/18/2019 CLINICAL DATA:  Initial treatment strategy for right lung mass, presumed bronchogenic carcinoma. Left inguinal nodal biopsy inconclusive, but suspicious for lymphoproliferative disorder. EXAM: NUCLEAR MEDICINE PET SKULL BASE TO THIGH TECHNIQUE: 5.35 mCi F-18 FDG was injected intravenously. Full-ring PET imaging was performed from the skull base to thigh after the radiotracer. CT data was obtained and used for attenuation correction and anatomic localization. Fasting blood glucose: 88 mg/dl COMPARISON:  Prior CTs 03/24/2019 and 03/25/2019. FINDINGS: Mediastinal blood pool activity: SUV max 1.4 Liver activity: SUV max 2.0 NECK: There is a single hypermetabolic left supraclavicular lymph node measuring approximately 11 mm on image 49/4. This has an SUV max 28.9. No other hypermetabolic or enlarged cervical lymph nodes.There are no lesions of the pharyngeal mucosal space. Incidental CT findings: Bilateral carotid atherosclerosis. CHEST: The bulky right perihilar mass is markedly hypermetabolic with an SUV max of 27.3. This demonstrates no central necrosis or associated bronchial obstruction and measures approximately 8.5 x 6.2 cm on image 41/8. There is an additional right lower lobe mass along the posterior aspect of the left atrium which is hypermetabolic, measuring approximately 3.0 cm on image 51/8 (SUV max 12.3). No other hypermetabolic  pulmonary nodules. There is possible hypermetabolic right hilar adenopathy adjacent to the dominant right perihilar mass. No other hypermetabolic mediastinal, hilar or axillary lymph nodes. Incidental CT findings: Previous median sternotomy and CABG. Diffuse atherosclerosis of the aorta, great vessels and coronary arteries. Additional small nodules in the right lower lobe and lingula are stable. There is mild left lower lobe atelectasis. ABDOMEN/PELVIS: Again demonstrated is heterogeneous enlargement of the spleen which  demonstrates diffuse hypermetabolic activity (SUV max 24.4). No hypermetabolic activity seen within the liver, pancreas or adrenal glands. There are multiple enlarged and hypermetabolic abdominopelvic lymph nodes. For example, celiac adenopathy has an SUV max of 26.0, and left periaortic adenopathy at the level of the upper sacrum has an SUV max of 20.5. A 3.5 x 2.3 cm left inguinal node on image 178/4 has an SUV max of 29.7. Incidental CT findings: The bladder is trabeculated. No hydronephrosis. Diffuse aortic and branch vessel atherosclerosis. SKELETON: There is no hypermetabolic activity to suggest osseous metastatic disease. Incidental CT findings: Mild degenerative changes in the spine and hips. IMPRESSION: 1. Extensive hypermetabolic adenopathy in the abdomen and pelvis with bulky right perihilar lung mass and left supraclavicular adenopathy, also hypermetabolic. 2. The heterogeneously enlarged spleen also demonstrates diffuse hypermetabolic activity. 3. No definite involvement of the liver, adrenal glands or bones. 4. The findings are best explained by a single diagnosis of lymphoma. Bronchogenic carcinoma is considered less likely. Additional tissue sampling recommended. Electronically Signed   By: Richardean Sale M.D.   On: 04/18/2019 14:05    ASSESSMENT AND PLAN: This is a 64 years old African-American male recently diagnosed with 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  The final pathology is still pending. I had a lengthy discussion with the patient and his sister as well as his niece about his current disease stage, prognosis and treatment options. I recommended for the patient systemic chemotherapy with R-CHOP every 3 weeks with Neulasta support.  I discussed with the  patient the adverse effect of this treatment including but not limited to alopecia, myelosuppression, nausea and vomiting, peripheral neuropathy, liver or renal dysfunction. I will arrange for the patient to have a chemotherapy education class before the first dose of his treatment. I will also start the patient on treatment with allopurinol 100 mg p.o. twice daily for prophylaxis of the tumor lysis after his treatment. For pain management I gave the patient a refill of fentanyl patch and he will continue his Percocet on as-needed basis. For the constipation he was advised to continue with MiraLAX and Colace as needed. I will also arrange for the patient to have 2D echo before the start of his chemotherapy. I will refer the patient to interventional radiology for Port-A-Cath placement for his treatment next week. The patient will come back for follow-up visit in 2 weeks for evaluation and management of any adverse effect of his treatment. The patient and his sister requested no CODE BLUE status. He was advised to call immediately if he has any concerning symptoms in the interval. The patient voices understanding of current disease status and treatment options and is in agreement with the current care plan.  All questions were answered. The patient knows to call the clinic with any problems, questions or concerns. We can certainly see the patient much sooner if necessary.  Disclaimer: This note was dictated with voice recognition software. Similar sounding words can inadvertently be transcribed and may not be corrected upon review.

## 2019-05-13 NOTE — Progress Notes (Signed)
START ON PATHWAY REGIMEN - Lymphoma and CLL     A cycle is every 21 days:     Prednisone      Rituximab-xxxx      Cyclophosphamide      Doxorubicin      Vincristine   **Always confirm dose/schedule in your pharmacy ordering system**  Patient Characteristics: Diffuse Large B-Cell Lymphoma or Follicular Lymphoma, Grade 3B, First Line, Stage III and IV Disease Type: Not Applicable Disease Type: Diffuse Large B-Cell Lymphoma Disease Type: Not Applicable Line of therapy: First Line [place] Stage: IV Intent of Therapy: Curative Intent, Discussed with Patient 

## 2019-05-14 LAB — SURGICAL PATHOLOGY

## 2019-05-16 ENCOUNTER — Other Ambulatory Visit: Payer: Medicaid Other

## 2019-05-16 ENCOUNTER — Ambulatory Visit (HOSPITAL_COMMUNITY)
Admission: RE | Admit: 2019-05-16 | Discharge: 2019-05-16 | Disposition: A | Discharge status: Home / Self Care | Payer: Medicaid Other | Source: Ambulatory Visit | Attending: Internal Medicine | Admitting: Internal Medicine

## 2019-05-16 ENCOUNTER — Other Ambulatory Visit: Payer: Self-pay

## 2019-05-16 DIAGNOSIS — Z87891 Personal history of nicotine dependence: Secondary | ICD-10-CM | POA: Diagnosis not present

## 2019-05-16 DIAGNOSIS — I429 Cardiomyopathy, unspecified: Secondary | ICD-10-CM | POA: Insufficient documentation

## 2019-05-16 DIAGNOSIS — Z01818 Encounter for other preprocedural examination: Secondary | ICD-10-CM | POA: Diagnosis not present

## 2019-05-16 DIAGNOSIS — C8338 Diffuse large B-cell lymphoma, lymph nodes of multiple sites: Secondary | ICD-10-CM | POA: Diagnosis not present

## 2019-05-16 DIAGNOSIS — I2510 Atherosclerotic heart disease of native coronary artery without angina pectoris: Secondary | ICD-10-CM | POA: Insufficient documentation

## 2019-05-16 DIAGNOSIS — I517 Cardiomegaly: Secondary | ICD-10-CM | POA: Diagnosis not present

## 2019-05-16 NOTE — Progress Notes (Signed)
  Echocardiogram 2D Echocardiogram has been performed.  Synda Bagent G Maurina Fawaz 05/16/2019, 3:15 PM

## 2019-05-19 ENCOUNTER — Inpatient Hospital Stay: Payer: Medicaid Other

## 2019-05-19 ENCOUNTER — Other Ambulatory Visit: Payer: Self-pay

## 2019-05-19 ENCOUNTER — Other Ambulatory Visit: Payer: Self-pay | Admitting: Physician Assistant

## 2019-05-19 ENCOUNTER — Telehealth: Payer: Self-pay | Admitting: Medical Oncology

## 2019-05-19 ENCOUNTER — Other Ambulatory Visit: Payer: Self-pay | Admitting: Medical Oncology

## 2019-05-19 ENCOUNTER — Encounter: Payer: Self-pay | Admitting: Physician Assistant

## 2019-05-19 ENCOUNTER — Telehealth: Payer: Self-pay

## 2019-05-19 DIAGNOSIS — G893 Neoplasm related pain (acute) (chronic): Secondary | ICD-10-CM

## 2019-05-19 MED ORDER — FENTANYL 25 MCG/HR TD PT72: 1.0000 | MEDICATED_PATCH | TRANSDERMAL | 0 refills | Status: DC

## 2019-05-19 NOTE — Progress Notes (Signed)
Met with patient in conference room at chemo ed to introduce myself as Arboriculturist and to offer available resources.  Discussed one-time $1000 Radio broadcast assistant to assist with personal expenses while going through treatment.  Gave my card if interested in applying and for any additional financial questions or concerns.

## 2019-05-19 NOTE — Telephone Encounter (Signed)
I called [facility] [address] and spoke to pharmacist and cancelled Fentanyl prescription.

## 2019-05-19 NOTE — Telephone Encounter (Signed)
Medication Problem with Fentanyl: Received VM message from patient's niece Verdene Lennert about medications going to the wrong pharmacy. TCT [place] and she apologized for mixing up the pharmacy stating that [facility] is not the correct pharmacy but [facility]. She added that all of patient's medications where picked up at [facility] except his Fentanyl.  When asked why it could not be picked up at [facility], she responded that it was too expensive at [facility]. [facility] and canceled the Fentanyl prescription.  Please send new script for this medication to [facility] on [address] [place].

## 2019-05-19 NOTE — Telephone Encounter (Signed)
They specifically requested [facility] for the fentanyl patch when they were here.  We will try to make the change.

## 2019-05-20 ENCOUNTER — Inpatient Hospital Stay: Payer: Medicaid Other

## 2019-05-20 ENCOUNTER — Other Ambulatory Visit: Payer: Self-pay

## 2019-05-20 VITALS — BP 103/64 | HR 74 | Temp 98.3°F | Resp 18

## 2019-05-20 DIAGNOSIS — C8338 Diffuse large B-cell lymphoma, lymph nodes of multiple sites: Secondary | ICD-10-CM

## 2019-05-20 DIAGNOSIS — Z5112 Encounter for antineoplastic immunotherapy: Secondary | ICD-10-CM | POA: Diagnosis not present

## 2019-05-20 DIAGNOSIS — Z95828 Presence of other vascular implants and grafts: Secondary | ICD-10-CM

## 2019-05-20 LAB — CMP (CANCER CENTER ONLY)
ALT: 19 U/L (ref 0–44)
AST: 20 U/L (ref 15–41)
Albumin: 2.6 g/dL — ABNORMAL LOW (ref 3.5–5.0)
Alkaline Phosphatase: 82 U/L (ref 38–126)
Anion gap: 11 (ref 5–15)
BUN: 15 mg/dL (ref 8–23)
CO2: 26 mmol/L (ref 22–32)
Calcium: 9.1 mg/dL (ref 8.9–10.3)
Chloride: 106 mmol/L (ref 98–111)
Creatinine: 0.73 mg/dL (ref 0.61–1.24)
GFR, Est AFR Am: >60 mL/min (ref >60)
GFR, Estimated: >60 mL/min (ref >60)
Glucose, Bld: 98 mg/dL (ref 70–99)
Potassium: 3.9 mmol/L (ref 3.5–5.1)
Sodium: 143 mmol/L (ref 135–145)
Total Bilirubin: <0.2 mg/dL — ABNORMAL LOW (ref 0.3–1.2)
Total Protein: 7 g/dL (ref 6.5–8.1)

## 2019-05-20 LAB — CBC WITH DIFFERENTIAL (CANCER CENTER ONLY)
Abs Immature Granulocytes: 0.05 10*3/uL (ref 0.00–0.07)
Basophils Absolute: 0 10*3/uL (ref 0.0–0.1)
Basophils Relative: 0 %
Eosinophils Absolute: 0.1 10*3/uL (ref 0.0–0.5)
Eosinophils Relative: 1 %
HCT: 33.8 % — ABNORMAL LOW (ref 39.0–52.0)
Hemoglobin: 11.1 g/dL — ABNORMAL LOW (ref 13.0–17.0)
Immature Granulocytes: 1 %
Lymphocytes Relative: 7 %
Lymphs Abs: 0.6 10*3/uL — ABNORMAL LOW (ref 0.7–4.0)
MCH: 29.4 pg (ref 26.0–34.0)
MCHC: 32.8 g/dL (ref 30.0–36.0)
MCV: 89.7 fL (ref 80.0–100.0)
Monocytes Absolute: 0.6 10*3/uL (ref 0.1–1.0)
Monocytes Relative: 6 %
Neutro Abs: 7.6 10*3/uL (ref 1.7–7.7)
Neutrophils Relative %: 85 %
Platelet Count: 493 10*3/uL — ABNORMAL HIGH (ref 150–400)
RBC: 3.77 MIL/uL — ABNORMAL LOW (ref 4.22–5.81)
RDW: 15.5 % (ref 11.5–15.5)
WBC Count: 8.9 10*3/uL (ref 4.0–10.5)
nRBC: 0 % (ref 0.0–0.2)

## 2019-05-20 LAB — URIC ACID: Uric Acid, Serum: 4.4 mg/dL (ref 3.7–8.6)

## 2019-05-20 MED ORDER — DIPHENHYDRAMINE HCL 25 MG PO CAPS
50.0000 mg | ORAL_CAPSULE | Freq: Once | ORAL | Status: AC
  Administered 2019-05-20: 50 mg via ORAL

## 2019-05-20 MED ORDER — DIPHENHYDRAMINE HCL 25 MG PO CAPS
ORAL_CAPSULE | ORAL | Status: AC
  Filled 2019-05-20: qty 1

## 2019-05-20 MED ORDER — SODIUM CHLORIDE 0.9 % IV SOLN
750.0000 mg/m2 | Freq: Once | INTRAVENOUS | Status: AC
  Administered 2019-05-20: 12:00:00 1120 mg via INTRAVENOUS
  Filled 2019-05-20: qty 56

## 2019-05-20 MED ORDER — SODIUM CHLORIDE 0.9 % IV SOLN
375.0000 mg/m2 | Freq: Once | INTRAVENOUS | Status: AC
  Administered 2019-05-20: 13:00:00 600 mg via INTRAVENOUS
  Filled 2019-05-20: qty 10

## 2019-05-20 MED ORDER — ACETAMINOPHEN 325 MG PO TABS
ORAL_TABLET | ORAL | Status: AC
  Filled 2019-05-20: qty 1

## 2019-05-20 MED ORDER — VINCRISTINE SULFATE CHEMO INJECTION 1 MG/ML
2.0000 mg | Freq: Once | INTRAVENOUS | Status: AC
  Administered 2019-05-20: 12:00:00 2 mg via INTRAVENOUS
  Filled 2019-05-20: qty 2

## 2019-05-20 MED ORDER — ACETAMINOPHEN 325 MG PO TABS
650.0000 mg | ORAL_TABLET | Freq: Once | ORAL | Status: AC
  Administered 2019-05-20: 650 mg via ORAL

## 2019-05-20 MED ORDER — DEXAMETHASONE SODIUM PHOSPHATE 10 MG/ML IJ SOLN
10.0000 mg | Freq: Once | INTRAMUSCULAR | Status: AC
  Administered 2019-05-20: 10:00:00 10 mg via INTRAVENOUS

## 2019-05-20 MED ORDER — DOXORUBICIN HCL CHEMO IV INJECTION 2 MG/ML
50.0000 mg/m2 | Freq: Once | INTRAVENOUS | Status: AC
  Administered 2019-05-20: 11:00:00 74 mg via INTRAVENOUS
  Filled 2019-05-20: qty 37

## 2019-05-20 MED ORDER — DEXAMETHASONE SODIUM PHOSPHATE 10 MG/ML IJ SOLN
INTRAMUSCULAR | Status: AC
  Filled 2019-05-20: qty 1

## 2019-05-20 MED ORDER — LIDOCAINE-PRILOCAINE 2.5-2.5 % EX CREA: TOPICAL_CREAM | Freq: Once | CUTANEOUS | Status: DC

## 2019-05-20 MED ORDER — LIDOCAINE-PRILOCAINE 2.5-2.5 % EX CREA: 1.0000 "application " | TOPICAL_CREAM | CUTANEOUS | 0 refills | Status: DC | PRN

## 2019-05-20 MED ORDER — PALONOSETRON HCL INJECTION 0.25 MG/5ML
INTRAVENOUS | Status: AC
  Filled 2019-05-20: qty 5

## 2019-05-20 MED ORDER — ACETAMINOPHEN 325 MG PO TABS
ORAL_TABLET | ORAL | Status: AC
  Filled 2019-05-20: qty 2

## 2019-05-20 MED ORDER — SODIUM CHLORIDE 0.9 % IV SOLN
Freq: Once | INTRAVENOUS | Status: AC
  Filled 2019-05-20: qty 250

## 2019-05-20 MED ORDER — DIPHENHYDRAMINE HCL 25 MG PO CAPS
ORAL_CAPSULE | ORAL | Status: AC
  Filled 2019-05-20: qty 2

## 2019-05-20 MED ORDER — SODIUM CHLORIDE 0.9 % IV SOLN
150.0000 mg | Freq: Once | INTRAVENOUS | Status: AC
  Administered 2019-05-20: 150 mg via INTRAVENOUS
  Filled 2019-05-20: qty 150

## 2019-05-20 MED ORDER — PALONOSETRON HCL INJECTION 0.25 MG/5ML
0.2500 mg | Freq: Once | INTRAVENOUS | Status: AC
  Administered 2019-05-20: 10:00:00 0.25 mg via INTRAVENOUS

## 2019-05-20 NOTE — Progress Notes (Signed)
EMLA cream prescription sent to patient's pharmacy. Sister Mardene Celeste made aware.

## 2019-05-20 NOTE — Patient Instructions (Signed)
[name] Discharge Instructions for Patients Receiving Chemotherapy  Today you received the following chemotherapy agents Rituxan, Adriamycin, Vincristine, Cytoxan  To help prevent nausea and vomiting after your treatment, we encourage you to take your nausea medication as directed by MD   If you develop nausea and vomiting that is not controlled by your nausea medication, call the clinic.   BELOW ARE SYMPTOMS THAT SHOULD BE REPORTED IMMEDIATELY:  *FEVER GREATER THAN 100.5 F  *CHILLS WITH OR WITHOUT FEVER  NAUSEA AND VOMITING THAT IS NOT CONTROLLED WITH YOUR NAUSEA MEDICATION  *UNUSUAL SHORTNESS OF BREATH  *UNUSUAL BRUISING OR BLEEDING  TENDERNESS IN MOUTH AND THROAT WITH OR WITHOUT PRESENCE OF ULCERS  *URINARY PROBLEMS  *BOWEL PROBLEMS  UNUSUAL RASH Items with * indicate a potential emergency and should be followed up as soon as possible.  Feel free to call the clinic should you have any questions or concerns. The clinic phone number is [phone].  Please show the [facility] at check-in to the Emergency Department and triage nurse.  Doxorubicin injection What is this medicine? DOXORUBICIN (dox oh ROO bi sin) is a chemotherapy drug. It is used to treat many kinds of cancer like leukemia, lymphoma, neuroblastoma, sarcoma, and Wilms' tumor. It is also used to treat bladder cancer, breast cancer, lung cancer, ovarian cancer, stomach cancer, and thyroid cancer. This medicine may be used for other purposes; ask your health care provider or pharmacist if you have questions. COMMON BRAND NAME(S): Adriamycin, Adriamycin PFS, Adriamycin RDF, Rubex What should I tell my health care provider before I take this medicine? They need to know if you have any of these conditions:  heart disease  history of low blood counts caused by a medicine  liver disease  recent or ongoing radiation therapy  an unusual or allergic reaction to doxorubicin, other  chemotherapy agents, other medicines, foods, dyes, or preservatives  pregnant or trying to get pregnant  breast-feeding How should I use this medicine? This drug is given as an infusion into a vein. It is administered in a hospital or clinic by a specially trained health care professional. If you have pain, swelling, burning or any unusual feeling around the site of your injection, tell your health care professional right away. Talk to your pediatrician regarding the use of this medicine in children. Special care may be needed. Overdosage: If you think you have taken too much of this medicine contact a poison control center or emergency room at once. NOTE: This medicine is only for you. Do not share this medicine with others. What if I miss a dose? It is important not to miss your dose. Call your doctor or health care professional if you are unable to keep an appointment. What may interact with this medicine? This medicine may interact with the following medications:  6-mercaptopurine  paclitaxel  phenytoin  St. John's Wort  trastuzumab  verapamil This list may not describe all possible interactions. Give your health care provider a list of all the medicines, herbs, non-prescription drugs, or dietary supplements you use. Also tell them if you smoke, drink alcohol, or use illegal drugs. Some items may interact with your medicine. What should I watch for while using this medicine? This drug may make you feel generally unwell. This is not uncommon, as chemotherapy can affect healthy cells as well as cancer cells. Report any side effects. Continue your course of treatment even though you feel ill unless your doctor tells you to stop. There is  a maximum amount of this medicine you should receive throughout your life. The amount depends on the medical condition being treated and your overall health. Your doctor will watch how much of this medicine you receive in your lifetime. Tell your doctor  if you have taken this medicine before. You may need blood work done while you are taking this medicine. Your urine may turn red for a few days after your dose. This is not blood. If your urine is dark or brown, call your doctor. In some cases, you may be given additional medicines to help with side effects. Follow all directions for their use. Call your doctor or health care professional for advice if you get a fever, chills or sore throat, or other symptoms of a cold or flu. Do not treat yourself. This drug decreases your body's ability to fight infections. Try to avoid being around people who are sick. This medicine may increase your risk to bruise or bleed. Call your doctor or health care professional if you notice any unusual bleeding. Talk to your doctor about your risk of cancer. You may be more at risk for certain types of cancers if you take this medicine. Do not become pregnant while taking this medicine or for 6 months after stopping it. Women should inform their doctor if they wish to become pregnant or think they might be pregnant. Men should not father a child while taking this medicine and for 6 months after stopping it. There is a potential for serious side effects to an unborn child. Talk to your health care professional or pharmacist for more information. Do not breast-feed an infant while taking this medicine. This medicine has caused ovarian failure in some women and reduced sperm counts in some men This medicine may interfere with the ability to have a child. Talk with your doctor or health care professional if you are concerned about your fertility. This medicine may cause a decrease in Co-Enzyme Q-10. You should make sure that you get enough Co-Enzyme Q-10 while you are taking this medicine. Discuss the foods you eat and the vitamins you take with your health care professional. What side effects may I notice from receiving this medicine? Side effects that you should report to your  doctor or health care professional as soon as possible:  allergic reactions like skin rash, itching or hives, swelling of the face, lips, or tongue  breathing problems  chest pain  fast or irregular heartbeat  low blood counts - this medicine may decrease the number of white blood cells, red blood cells and platelets. You may be at increased risk for infections and bleeding.  pain, redness, or irritation at site where injected  signs of infection - fever or chills, cough, sore throat, pain or difficulty passing urine  signs of decreased platelets or bleeding - bruising, pinpoint red spots on the skin, black, tarry stools, blood in the urine  swelling of the ankles, feet, hands  tiredness  weakness Side effects that usually do not require medical attention (report to your doctor or health care professional if they continue or are bothersome):  diarrhea  hair loss  mouth sores  nail discoloration or damage  nausea  red colored urine  vomiting This list may not describe all possible side effects. Call your doctor for medical advice about side effects. You may report side effects to FDA at 1-800-FDA-1088. Where should I keep my medicine? This drug is given in a hospital or clinic and will not be  stored at home. NOTE: This sheet is a summary. It may not cover all possible information. If you have questions about this medicine, talk to your doctor, pharmacist, or health care provider.  2020 Elsevier/Gold Standard (2016-10-25 11:01:26)  Vincristine injection What is this medicine? VINCRISTINE (vin KRIS teen) is a chemotherapy drug. It slows the growth of cancer cells. This medicine is used to treat many types of cancer like Hodgkin's disease, leukemia, non-Hodgkin's lymphoma, neuroblastoma (brain cancer), rhabdomyosarcoma, and Wilms' tumor. This medicine may be used for other purposes; ask your health care provider or pharmacist if you have questions. COMMON BRAND NAME(S):  Oncovin, Vincasar PFS What should I tell my health care provider before I take this medicine? They need to know if you have any of these conditions:  blood disorders  gout  infection (especially chickenpox, cold sores, or herpes)  kidney disease  liver disease  lung disease  nervous system disease like Charcot-Marie-Tooth (CMT)  recent or ongoing radiation therapy  an unusual or allergic reaction to vincristine, other chemotherapy agents, other medicines, foods, dyes, or preservatives  pregnant or trying to get pregnant  breast-feeding How should I use this medicine? This drug is given as an infusion into a vein. It is administered in a hospital or clinic by a specially trained health care professional. If you have pain, swelling, burning, or any unusual feeling around the site of your injection, tell your health care professional right away. Talk to your pediatrician regarding the use of this medicine in children. While this drug may be prescribed for selected conditions, precautions do apply. Overdosage: If you think you have taken too much of this medicine contact a poison control center or emergency room at once. NOTE: This medicine is only for you. Do not share this medicine with others. What if I miss a dose? It is important not to miss your dose. Call your doctor or health care professional if you are unable to keep an appointment. What may interact with this medicine? Do not take this medicine with any of the following medications:  itraconazole  mibefradil  voriconazole This medicine may also interact with the following medications:  cyclosporine  erythromycin  fluconazole  ketoconazole  medicines for HIV like delavirdine, efavirenz, nevirapine  medicines for seizures like ethotoin, fosphenotoin, phenytoin  medicines to increase blood counts like filgrastim, pegfilgrastim, sargramostim  other chemotherapy drugs like cisplatin, L-asparaginase,  methotrexate, mitomycin, paclitaxel  pegaspargase  vaccines  zalcitabine, ddC Talk to your doctor or health care professional before taking any of these medicines:  acetaminophen  aspirin  ibuprofen  ketoprofen  naproxen This list may not describe all possible interactions. Give your health care provider a list of all the medicines, herbs, non-prescription drugs, or dietary supplements you use. Also tell them if you smoke, drink alcohol, or use illegal drugs. Some items may interact with your medicine. What should I watch for while using this medicine? This drug may make you feel generally unwell. This is not uncommon, as chemotherapy can affect healthy cells as well as cancer cells. Report any side effects. Continue your course of treatment even though you feel ill unless your doctor tells you to stop. You may need blood work done while you are taking this medicine. This medicine will cause constipation. Try to have a bowel movement at least every 2 to 3 days. If you do not have a bowel movement for 3 days, call your doctor or health care professional. In some cases, you may be given additional  medicines to help with side effects. Follow all directions for their use. Do not become pregnant while taking this medicine. Women should inform their doctor if they wish to become pregnant or think they might be pregnant. There is a potential for serious side effects to an unborn child. Talk to your health care professional or pharmacist for more information. Do not breast-feed an infant while taking this medicine. This medicine may make it more difficult to get pregnant or to father a child. Talk to your healthcare professional if you are concerned about your fertility. What side effects may I notice from receiving this medicine? Side effects that you should report to your doctor or health care professional as soon as possible:  allergic reactions like skin rash, itching or hives, swelling of  the face, lips, or tongue  breathing problems  confusion or changes in emotions or moods  constipation  cough  mouth sores  muscle weakness  nausea and vomiting  pain, swelling, redness or irritation at the injection site  pain, tingling, numbness in the hands or feet  problems with balance, talking, walking  seizures  stomach pain  trouble passing urine or change in the amount of urine Side effects that usually do not require medical attention (report to your doctor or health care professional if they continue or are bothersome):  diarrhea  hair loss  jaw pain  loss of appetite This list may not describe all possible side effects. Call your doctor for medical advice about side effects. You may report side effects to FDA at 1-800-FDA-1088. Where should I keep my medicine? This drug is given in a hospital or clinic and will not be stored at home. NOTE: This sheet is a summary. It may not cover all possible information. If you have questions about this medicine, talk to your doctor, pharmacist, or health care provider.  2020 Elsevier/Gold Standard (2017-12-14 08:55:02) Cyclophosphamide Injection What is this medicine? CYCLOPHOSPHAMIDE (sye kloe FOSS fa mide) is a chemotherapy drug. It slows the growth of cancer cells. This medicine is used to treat many types of cancer like lymphoma, myeloma, leukemia, breast cancer, and ovarian cancer, to name a few. This medicine may be used for other purposes; ask your health care provider or pharmacist if you have questions. COMMON BRAND NAME(S): Cytoxan, Neosar What should I tell my health care provider before I take this medicine? They need to know if you have any of these conditions:  heart disease  history of irregular heartbeat  infection  kidney disease  liver disease  low blood counts, like white cells, platelets, or red blood cells  on hemodialysis  recent or ongoing radiation therapy  scarring or thickening  of the lungs  trouble passing urine  an unusual or allergic reaction to cyclophosphamide, other medicines, foods, dyes, or preservatives  pregnant or trying to get pregnant  breast-feeding How should I use this medicine? This drug is usually given as an injection into a vein or muscle or by infusion into a vein. It is administered in a hospital or clinic by a specially trained health care professional. Talk to your pediatrician regarding the use of this medicine in children. Special care may be needed. Overdosage: If you think you have taken too much of this medicine contact a poison control center or emergency room at once. NOTE: This medicine is only for you. Do not share this medicine with others. What if I miss a dose? It is important not to miss your dose. Call your  doctor or health care professional if you are unable to keep an appointment. What may interact with this medicine?  amphotericin B  azathioprine  certain antivirals for HIV or hepatitis  certain medicines for blood pressure, heart disease, irregular heart beat  certain medicines that treat or prevent blood clots like warfarin  certain other medicines for cancer  cyclosporine  etanercept  indomethacin  medicines that relax muscles for surgery  medicines to increase blood counts  metronidazole This list may not describe all possible interactions. Give your health care provider a list of all the medicines, herbs, non-prescription drugs, or dietary supplements you use. Also tell them if you smoke, drink alcohol, or use illegal drugs. Some items may interact with your medicine. What should I watch for while using this medicine? Your condition will be monitored carefully while you are receiving this medicine. You may need blood work done while you are taking this medicine. Drink water or other fluids as directed. Urinate often, even at night. Some products may contain alcohol. Ask your health care professional  if this medicine contains alcohol. Be sure to tell all health care professionals you are taking this medicine. Certain medicines, like metronidazole and disulfiram, can cause an unpleasant reaction when taken with alcohol. The reaction includes flushing, headache, nausea, vomiting, sweating, and increased thirst. The reaction can last from 30 minutes to several hours. Do not become pregnant while taking this medicine or for 1 year after stopping it. Women should inform their health care professional if they wish to become pregnant or think they might be pregnant. Men should not father a child while taking this medicine and for 4 months after stopping it. There is potential for serious side effects to an unborn child. Talk to your health care professional for more information. Do not breast-feed an infant while taking this medicine or for 1 week after stopping it. This medicine has caused ovarian failure in some women. This medicine may make it more difficult to get pregnant. Talk to your health care professional if you are concerned about your fertility. This medicine has caused decreased sperm counts in some men. This may make it more difficult to father a child. Talk to your health care professional if you are concerned about your fertility. Call your health care professional for advice if you get a fever, chills, or sore throat, or other symptoms of a cold or flu. Do not treat yourself. This medicine decreases your body's ability to fight infections. Try to avoid being around people who are sick. Avoid taking medicines that contain aspirin, acetaminophen, ibuprofen, naproxen, or ketoprofen unless instructed by your health care professional. These medicines may hide a fever. Talk to your health care professional about your risk of cancer. You may be more at risk for certain types of cancer if you take this medicine. If you are going to need surgery or other procedure, tell your health care professional  that you are using this medicine. Be careful brushing or flossing your teeth or using a toothpick because you may get an infection or bleed more easily. If you have any dental work done, tell your dentist you are receiving this medicine. What side effects may I notice from receiving this medicine? Side effects that you should report to your doctor or health care professional as soon as possible:  allergic reactions like skin rash, itching or hives, swelling of the face, lips, or tongue  breathing problems  nausea, vomiting  signs and symptoms of bleeding such as  bloody or black, tarry stools; red or dark brown urine; spitting up blood or brown material that looks like coffee grounds; red spots on the skin; unusual bruising or bleeding from the eyes, gums, or nose  signs and symptoms of heart failure like fast, irregular heartbeat, sudden weight gain; swelling of the ankles, feet, hands  signs and symptoms of infection like fever; chills; cough; sore throat; pain or trouble passing urine  signs and symptoms of kidney injury like trouble passing urine or change in the amount of urine  signs and symptoms of liver injury like dark yellow or brown urine; general ill feeling or flu-like symptoms; light-colored stools; loss of appetite; nausea; right upper belly pain; unusually weak or tired; yellowing of the eyes or skin Side effects that usually do not require medical attention (report to your doctor or health care professional if they continue or are bothersome):  confusion  decreased hearing  diarrhea  facial flushing  hair loss  headache  loss of appetite  missed menstrual periods  signs and symptoms of low red blood cells or anemia such as unusually weak or tired; feeling faint or lightheaded; falls  skin discoloration This list may not describe all possible side effects. Call your doctor for medical advice about side effects. You may report side effects to FDA at  1-800-FDA-1088. Where should I keep my medicine? This drug is given in a hospital or clinic and will not be stored at home. NOTE: This sheet is a summary. It may not cover all possible information. If you have questions about this medicine, talk to your doctor, pharmacist, or health care provider.  2020 Elsevier/Gold Standard (2018-12-16 09:53:29) Rituximab injection What is this medicine? RITUXIMAB (ri TUX i mab) is a monoclonal antibody. It is used to treat certain types of cancer like non-Hodgkin lymphoma and chronic lymphocytic leukemia. It is also used to treat rheumatoid arthritis, granulomatosis with polyangiitis (or Wegener's granulomatosis), microscopic polyangiitis, and pemphigus vulgaris. This medicine may be used for other purposes; ask your health care provider or pharmacist if you have questions. COMMON BRAND NAME(S): Rituxan, RUXIENCE What should I tell my health care provider before I take this medicine? They need to know if you have any of these conditions:  heart disease  infection (especially a virus infection such as hepatitis B, chickenpox, cold sores, or herpes)  immune system problems  irregular heartbeat  kidney disease  low blood counts, like low white cell, platelet, or red cell counts  lung or breathing disease, like asthma  recently received or scheduled to receive a vaccine  an unusual or allergic reaction to rituximab, other medicines, foods, dyes, or preservatives  pregnant or trying to get pregnant  breast-feeding How should I use this medicine? This medicine is for infusion into a vein. It is administered in a hospital or clinic by a specially trained health care professional. A special MedGuide will be given to you by the pharmacist with each prescription and refill. Be sure to read this information carefully each time. Talk to your pediatrician regarding the use of this medicine in children. This medicine is not approved for use in  children. Overdosage: If you think you have taken too much of this medicine contact a poison control center or emergency room at once. NOTE: This medicine is only for you. Do not share this medicine with others. What if I miss a dose? It is important not to miss a dose. Call your doctor or health care professional if you are  unable to keep an appointment. What may interact with this medicine?  cisplatin  live virus vaccines This list may not describe all possible interactions. Give your health care provider a list of all the medicines, herbs, non-prescription drugs, or dietary supplements you use. Also tell them if you smoke, drink alcohol, or use illegal drugs. Some items may interact with your medicine. What should I watch for while using this medicine? Your condition will be monitored carefully while you are receiving this medicine. You may need blood work done while you are taking this medicine. This medicine can cause serious allergic reactions. To reduce your risk you may need to take medicine before treatment with this medicine. Take your medicine as directed. In some patients, this medicine may cause a serious brain infection that may cause death. If you have any problems seeing, thinking, speaking, walking, or standing, tell your healthcare professional right away. If you cannot reach your healthcare professional, urgently seek other source of medical care. Call your doctor or health care professional for advice if you get a fever, chills or sore throat, or other symptoms of a cold or flu. Do not treat yourself. This drug decreases your body's ability to fight infections. Try to avoid being around people who are sick. Do not become pregnant while taking this medicine or for at least 12 months after stopping it. Women should inform their doctor if they wish to become pregnant or think they might be pregnant. There is a potential for serious side effects to an unborn child. Talk to your health  care professional or pharmacist for more information. Do not breast-feed an infant while taking this medicine or for at least 6 months after stopping it. What side effects may I notice from receiving this medicine? Side effects that you should report to your doctor or health care professional as soon as possible:  allergic reactions like skin rash, itching or hives; swelling of the face, lips, or tongue  breathing problems  chest pain  changes in vision  diarrhea  headache with fever, neck stiffness, sensitivity to light, nausea, or confusion  fast, irregular heartbeat  loss of memory  low blood counts - this medicine may decrease the number of white blood cells, red blood cells and platelets. You may be at increased risk for infections and bleeding.  mouth sores  problems with balance, talking, or walking  redness, blistering, peeling or loosening of the skin, including inside the mouth  signs of infection - fever or chills, cough, sore throat, pain or difficulty passing urine  signs and symptoms of kidney injury like trouble passing urine or change in the amount of urine  signs and symptoms of liver injury like dark yellow or brown urine; general ill feeling or flu-like symptoms; light-colored stools; loss of appetite; nausea; right upper belly pain; unusually weak or tired; yellowing of the eyes or skin  signs and symptoms of low blood pressure like dizziness; feeling faint or lightheaded, falls; unusually weak or tired  stomach pain  swelling of the ankles, feet, hands  unusual bleeding or bruising  vomiting Side effects that usually do not require medical attention (report to your doctor or health care professional if they continue or are bothersome):  headache  joint pain  muscle cramps or muscle pain  nausea  tiredness This list may not describe all possible side effects. Call your doctor for medical advice about side effects. You may report side effects  to FDA at 1-800-FDA-1088. Where should I keep  my medicine? This drug is given in a hospital or clinic and will not be stored at home. NOTE: This sheet is a summary. It may not cover all possible information. If you have questions about this medicine, talk to your doctor, pharmacist, or health care provider.  2020 Elsevier/Gold Standard (2018-04-24 22:01:36)

## 2019-05-20 NOTE — Progress Notes (Unsigned)
Per Dr. Julien Nordmann ok to infuse first time Adriamycin peripherally.   No blood return noted in wrist IV. New IV placed in right [facility]- see flowsheet. Blood return noted every 3 CC with Adriamycin infusion over 15 minutes. Patient tolerated well. Blood return again noted prior to Vincristine started.

## 2019-05-20 NOTE — Progress Notes (Signed)
MD ok to proceed with treatment while awaiting ECHO results from 2.19.21

## 2019-05-22 ENCOUNTER — Inpatient Hospital Stay: Payer: Medicaid Other

## 2019-05-22 ENCOUNTER — Other Ambulatory Visit: Payer: Self-pay

## 2019-05-22 VITALS — BP 102/63 | HR 77 | Temp 98.3°F | Resp 16

## 2019-05-22 DIAGNOSIS — C8338 Diffuse large B-cell lymphoma, lymph nodes of multiple sites: Secondary | ICD-10-CM

## 2019-05-22 DIAGNOSIS — Z5112 Encounter for antineoplastic immunotherapy: Secondary | ICD-10-CM | POA: Diagnosis not present

## 2019-05-22 MED ORDER — PEGFILGRASTIM-JMDB 6 MG/0.6ML ~~LOC~~ SOSY
PREFILLED_SYRINGE | SUBCUTANEOUS | Status: AC
  Filled 2019-05-22: qty 0.6

## 2019-05-22 MED ORDER — PEGFILGRASTIM-JMDB 6 MG/0.6ML ~~LOC~~ SOSY
6.0000 mg | PREFILLED_SYRINGE | Freq: Once | SUBCUTANEOUS | Status: AC
  Administered 2019-05-22: 6 mg via SUBCUTANEOUS

## 2019-05-22 NOTE — Patient Instructions (Signed)

## 2019-05-23 ENCOUNTER — Other Ambulatory Visit: Payer: Self-pay | Admitting: Radiology

## 2019-05-26 ENCOUNTER — Encounter (HOSPITAL_COMMUNITY): Payer: Self-pay

## 2019-05-26 ENCOUNTER — Ambulatory Visit (HOSPITAL_COMMUNITY)
Admission: RE | Admit: 2019-05-26 | Discharge: 2019-05-26 | Disposition: A | Discharge status: Home / Self Care | Payer: Medicaid Other | Source: Ambulatory Visit | Attending: Internal Medicine | Admitting: Internal Medicine

## 2019-05-26 ENCOUNTER — Other Ambulatory Visit: Payer: Self-pay | Admitting: Internal Medicine

## 2019-05-26 ENCOUNTER — Other Ambulatory Visit: Payer: Self-pay

## 2019-05-26 DIAGNOSIS — Z8673 Personal history of transient ischemic attack (TIA), and cerebral infarction without residual deficits: Secondary | ICD-10-CM | POA: Diagnosis not present

## 2019-05-26 DIAGNOSIS — I255 Ischemic cardiomyopathy: Secondary | ICD-10-CM | POA: Insufficient documentation

## 2019-05-26 DIAGNOSIS — C8338 Diffuse large B-cell lymphoma, lymph nodes of multiple sites: Secondary | ICD-10-CM | POA: Insufficient documentation

## 2019-05-26 DIAGNOSIS — I2510 Atherosclerotic heart disease of native coronary artery without angina pectoris: Secondary | ICD-10-CM | POA: Insufficient documentation

## 2019-05-26 DIAGNOSIS — Z7982 Long term (current) use of aspirin: Secondary | ICD-10-CM | POA: Diagnosis not present

## 2019-05-26 DIAGNOSIS — Z951 Presence of aortocoronary bypass graft: Secondary | ICD-10-CM | POA: Diagnosis not present

## 2019-05-26 HISTORY — PX: IR IMAGING GUIDED PORT INSERTION: IMG5740

## 2019-05-26 MED ORDER — LIDOCAINE-EPINEPHRINE 1 %-1:100000 IJ SOLN
INTRAMUSCULAR | Status: AC
  Filled 2019-05-26: qty 1

## 2019-05-26 MED ORDER — HEPARIN SOD (PORK) LOCK FLUSH 100 UNIT/ML IV SOLN
INTRAVENOUS | Status: AC | PRN
  Administered 2019-05-26: 500 [IU] via INTRAVENOUS

## 2019-05-26 MED ORDER — LIDOCAINE-EPINEPHRINE (PF) 1 %-1:200000 IJ SOLN
INTRAMUSCULAR | Status: AC | PRN
  Administered 2019-05-26: 5 mL

## 2019-05-26 MED ORDER — CEFAZOLIN SODIUM-DEXTROSE 2-4 GM/100ML-% IV SOLN
INTRAVENOUS | Status: AC
  Administered 2019-05-26: 2 g via INTRAVENOUS
  Filled 2019-05-26: qty 100

## 2019-05-26 MED ORDER — SODIUM CHLORIDE 0.9 % IV SOLN: INTRAVENOUS | Status: DC

## 2019-05-26 MED ORDER — HEPARIN SOD (PORK) LOCK FLUSH 100 UNIT/ML IV SOLN
INTRAVENOUS | Status: AC
  Filled 2019-05-26: qty 5

## 2019-05-26 MED ORDER — CEFAZOLIN SODIUM-DEXTROSE 2-4 GM/100ML-% IV SOLN: 2.0000 g | Freq: Once | INTRAVENOUS | Status: AC

## 2019-05-26 MED ORDER — MIDAZOLAM HCL 2 MG/2ML IJ SOLN
INTRAMUSCULAR | Status: AC
  Filled 2019-05-26: qty 4

## 2019-05-26 MED ORDER — FENTANYL CITRATE (PF) 100 MCG/2ML IJ SOLN
INTRAMUSCULAR | Status: AC | PRN
  Administered 2019-05-26: 50 ug via INTRAVENOUS

## 2019-05-26 MED ORDER — MIDAZOLAM HCL 2 MG/2ML IJ SOLN
INTRAMUSCULAR | Status: AC | PRN
  Administered 2019-05-26 (×2): 1 mg via INTRAVENOUS

## 2019-05-26 MED ORDER — FENTANYL CITRATE (PF) 100 MCG/2ML IJ SOLN
INTRAMUSCULAR | Status: AC
  Filled 2019-05-26: qty 2

## 2019-05-26 NOTE — Discharge Instructions (Signed)
NO EMLA Cream for 2 weeks !   Implanted Port Insertion, Care After This sheet gives you information about how to care for yourself after your procedure. Your health care provider may also give you more specific instructions. If you have problems or questions, contact your health care provider. What can I expect after the procedure? After the procedure, it is common to have:  Discomfort at the port insertion site.  Bruising on the skin over the port. This should improve over 3-4 days. Follow these instructions at home: [facility] care  After your port is placed, you will get a manufacturer's information card. The card has information about your port. Keep this card with you at all times.  Take care of the port as told by your health care provider. Ask your health care provider if you or a family member can get training for taking care of the port at home. A home health care nurse may also take care of the port.  Make sure to remember what type of port you have. Incision care      Follow instructions from your health care provider about how to take care of your port insertion site. Make sure you: ? Wash your hands with soap and water before and after you change your bandage (dressing). If soap and water are not available, use hand sanitizer. ? Change your dressing as told by your health care provider. ? Leave stitches (sutures), skin glue, or adhesive strips in place. These skin closures may need to stay in place for 2 weeks or longer. If adhesive strip edges start to loosen and curl up, you may trim the loose edges. Do not remove adhesive strips completely unless your health care provider tells you to do that.  Check your port insertion site every day for signs of infection. Check for: ? Redness, swelling, or pain. ? Fluid or blood. ? Warmth. ? Pus or a bad smell. Activity  Return to your normal activities as told by your health care provider. Ask your health care provider what  activities are safe for you.  Do not lift anything that is heavier than 10 lb (4.5 kg), or the limit that you are told, until your health care provider says that it is safe. General instructions  Take over-the-counter and prescription medicines only as told by your health care provider.  Do not take baths, swim, or use a hot tub until your health care provider approves. Ask your health care provider if you may take showers. You may only be allowed to take sponge baths.  Do not drive for 24 hours if you were given a sedative during your procedure.  Wear a medical alert bracelet in case of an emergency. This will tell any health care providers that you have a port.  Keep all follow-up visits as told by your health care provider. This is important. Contact a health care provider if:  You cannot flush your port with saline as directed, or you cannot draw blood from the port.  You have a fever or chills.  You have redness, swelling, or pain around your port insertion site.  You have fluid or blood coming from your port insertion site.  Your port insertion site feels warm to the touch.  You have pus or a bad smell coming from the port insertion site. Get help right away if:  You have chest pain or shortness of breath.  You have bleeding from your port that you cannot control. Summary  Take  care of the port as told by your health care provider. Keep the manufacturer's information card with you at all times.  Change your dressing as told by your health care provider.  Contact a health care provider if you have a fever or chills or if you have redness, swelling, or pain around your port insertion site.  Keep all follow-up visits as told by your health care provider. This information is not intended to replace advice given to you by your health care provider. Make sure you discuss any questions you have with your health care provider. Document Revised: 10/09/2017 Document Reviewed:  10/09/2017 Elsevier Patient Education  [place]. Moderate Conscious Sedation, Adult, Care After These instructions provide you with information about caring for yourself after your procedure. Your health care provider may also give you more specific instructions. Your treatment has been planned according to current medical practices, but problems sometimes occur. Call your health care provider if you have any problems or questions after your procedure. What can I expect after the procedure? After your procedure, it is common:  To feel sleepy for several hours.  To feel clumsy and have poor balance for several hours.  To have poor judgment for several hours.  To vomit if you eat too soon. Follow these instructions at home: For at least 24 hours after the procedure:   Do not: ? Participate in activities where you could fall or become injured. ? Drive. ? Use heavy machinery. ? Drink alcohol. ? Take sleeping pills or medicines that cause drowsiness. ? Make important decisions or sign legal documents. ? Take care of children on your own.  Rest. Eating and drinking  Follow the diet recommended by your health care provider.  If you vomit: ? Drink water, juice, or soup when you can drink without vomiting. ? Make sure you have little or no nausea before eating solid foods. General instructions  Have a responsible adult stay with you until you are awake and alert.  Take over-the-counter and prescription medicines only as told by your health care provider.  If you smoke, do not smoke without supervision.  Keep all follow-up visits as told by your health care provider. This is important. Contact a health care provider if:  You keep feeling nauseous or you keep vomiting.  You feel light-headed.  You develop a rash.  You have a fever. Get help right away if:  You have trouble breathing. This information is not intended to replace advice given to you by your health  care provider. Make sure you discuss any questions you have with your health care provider. Document Revised: 02/23/2017 Document Reviewed: 07/03/2015 Elsevier Patient Education  2020 [employer].

## 2019-05-26 NOTE — Progress Notes (Signed)
Called sister; received some pre IR information on meds; history etc. that patient stated he did not want to answer.  "Im too tired and have a headache with all these questions; its in my chart"  Provided information to sister on post IR instructions.   Will call once pt. Returns from procedure.   Sister going home and then return to pick up Mr. Scallon.

## 2019-05-26 NOTE — H&P (Signed)
Referring Physician(s): [name],[name]  Supervising Physician: Sandi Mariscal  Patient Status:  [facility] OP  Chief Complaint:  "I'm here for a port a cath"  Subjective: Patient familiar to IR service from left inguinal lymph node biopsy on 03/26/2019.  He has a history of newly diagnosed non-Hodgkin's lymphoma/diffuse large B-cell lymphoma and presents again today for Port-A-Cath placement for chemotherapy.  He currently denies fever, chest pain, dyspnea, cough, back pain, nausea, vomiting or bleeding.  He does have occasional headaches, and some abdominal discomfort.  Past Medical History:  Diagnosis Date  . CAD (coronary artery disease)    a. 04/2008 Cath: LM nl, LAD 50p, 44m, LCX min irregs, RCA 50p/m, 40d, RPDA 60-70ost; b. 04/2008 CABG x 4: LIMA->LAD, VG->D2, VG->RPDA->RPL.  . Cancer ([place])   . Cocaine abuse ([place])    a. Quit early 2019.  . Ischemic cardiomyopathy    a. LV gram: EF 30-35%, apical/periapical AK.  . Stroke ([place]) 02/2019  . Tobacco abuse    Past Surgical History:  Procedure Laterality Date  . BYPASS GRAFT  2010  . CARDIAC SURGERY  2010  . SUPRACLAVICAL NODE BIOPSY Left 05/08/2019   Procedure: SUPRACLAVICAL NODE BIOPSY;  Surgeon: [name], MD;  Location: [facility];  Service: Thoracic;  Laterality: Left;       Allergies: Patient has no known allergies.  Medications: Prior to Admission medications   Medication Sig Start Date End Date Taking? Authorizing Provider  acetaminophen (TYLENOL) 325 MG tablet Take 2 tablets (650 mg total) by mouth every 4 (four) hours as needed for mild pain (or temp > 37.5 C (99.5 F)). 03/27/19  Yes Nita Sells, MD  allopurinol (ZYLOPRIM) 100 MG tablet Take 1 tablet (100 mg total) by mouth 2 (two) times daily. 05/13/19  Yes [name], MD  aspirin EC 81 MG tablet Take 1 tablet (81 mg total) by mouth daily. 04/22/19  Yes McCue, Janett Billow, NP  oxyCODONE-acetaminophen (PERCOCET/ROXICET) 5-325 MG tablet Take 1 tablet by  mouth every 6 (six) hours as needed for severe pain. 05/08/19  Yes [name], MD  predniSONE (DELTASONE) 50 MG tablet 2 tablet p.o. daily for 5 days start with the first day of every chemotherapy cycle. 05/13/19  Yes [name], MD  docusate sodium (COLACE) 100 MG capsule Take 1 capsule (100 mg total) by mouth every 12 (twelve) hours. 04/05/19   Palumbo, April, MD  fentaNYL (DURAGESIC) 25 MCG/HR Place 1 patch onto the skin every 3 (three) days. 05/19/19   Heilingoetter, Cassandra L, PA-C  lidocaine-prilocaine (EMLA) cream Apply 1 application topically as needed. Apply a teaspoon amount to port site an hour prior to chemo therapy, DO NOT rub in, cover with plastic wrap. 05/20/19   [name], MD  polyethylene glycol (MIRALAX / GLYCOLAX) 17 g packet Take 17 g by mouth daily. 03/28/19   Nita Sells, MD  prochlorperazine (COMPAZINE) 10 MG tablet Take 1 tablet (10 mg total) by mouth every 6 (six) hours as needed for nausea or vomiting. 05/13/19   [name], MD     Vital Signs: Blood pressure 104/66, heart rate 72, respirations 16, temperature 98.4, O2 sat 97% room air   Physical Exam awake, alert.  Chest with distant breath sounds bilaterally.  Heart with regular rate and rhythm.  Abdomen soft, positive bowel sounds, some mild mid abdominal tenderness to palpation.  No lower extremity edema.  Imaging: No results found.  Labs:  CBC: Recent Labs    04/04/19 2006 05/08/19 0550 05/13/19 1052  05/20/19 0807  WBC 8.3 8.9 5.4 8.9  HGB 11.2* 12.4* 11.4* 11.1*  HCT 35.4* 37.8* 35.4* 33.8*  PLT 666* 567* 527* 493*    COAGS: Recent Labs    03/04/19 1531 05/08/19 0550  INR 1.1 1.1  APTT 34 34    BMP: Recent Labs    04/04/19 2006 05/08/19 0550 05/13/19 1052 05/20/19 0807  NA 144 136 139 143  K 4.0 4.1 4.3 3.9  CL 108 95* 100 106  CO2 27 24 28 26   GLUCOSE 91 98 83 98  BUN 19 17 17 15   CALCIUM 9.3 10.2 9.7 9.1  CREATININE 0.81 0.70 0.80 0.73    GFRNONAA >60 >60 >60 >60  GFRAA >60 >60 >60 >60    LIVER FUNCTION TESTS: Recent Labs    04/04/19 2006 05/08/19 0550 05/13/19 1052 05/20/19 0807  BILITOT 0.5 0.8 0.4 <0.2*  AST 15 22 11* 20  ALT 11 12 6 19   ALKPHOS 65 81 85 82  PROT 7.4 8.1 7.8 7.0  ALBUMIN 2.8* 3.0* 2.7* 2.6*    Assessment and Plan: Pt with history of newly diagnosed non-Hodgkin's lymphoma/diffuse large B-cell lymphoma ; presents  today for Port-A-Cath placement for chemotherapy. Risks and benefits of image guided port-a-catheter placement was discussed with the patient including, but not limited to bleeding, infection, pneumothorax, or fibrin sheath development and need for additional procedures.  All of the patient's questions were answered, patient is agreeable to proceed. Consent signed and in chart.     Electronically Signed: D. Rowe Robert, PA-C 05/26/2019, 9:56 AM   I spent a total of  25 minutes at the the patient's bedside AND on the patient's hospital floor or unit, greater than 50% of which was counseling/coordinating care for Port-A-Cath placement

## 2019-05-26 NOTE — Procedures (Signed)
Pre Procedure Dx: Lymphoma Post Procedural Dx: Same  Successful placement of right IJ approach port-a-cath with tip at the superior caval atrial junction. The catheter is ready for immediate use.  Estimated Blood Loss: Minimal  Complications: None immediate.  Jay Scherry Laverne, MD Pager #: [phone]   

## 2019-05-26 NOTE — Progress Notes (Signed)
Called sister Mardene Celeste; aware of time of discharge and post IR discharge instructions.   Will also review with patient

## 2019-05-27 ENCOUNTER — Encounter: Payer: Self-pay | Admitting: Physician Assistant

## 2019-05-27 ENCOUNTER — Inpatient Hospital Stay: Payer: Medicaid Other

## 2019-05-27 ENCOUNTER — Inpatient Hospital Stay: Payer: Medicaid Other | Attending: Internal Medicine | Admitting: Physician Assistant

## 2019-05-27 ENCOUNTER — Other Ambulatory Visit: Payer: Self-pay

## 2019-05-27 ENCOUNTER — Ambulatory Visit (INDEPENDENT_AMBULATORY_CARE_PROVIDER_SITE_OTHER): Payer: Medicaid Other | Admitting: Thoracic Surgery (Cardiothoracic Vascular Surgery)

## 2019-05-27 VITALS — BP 87/59 | HR 79 | Temp 97.8°F | Resp 18 | Ht 67.0 in | Wt 112.7 lb

## 2019-05-27 VITALS — BP 95/60 | HR 80 | Temp 97.9°F | Resp 20 | Ht 67.0 in | Wt 112.0 lb

## 2019-05-27 DIAGNOSIS — Z5112 Encounter for antineoplastic immunotherapy: Secondary | ICD-10-CM | POA: Diagnosis present

## 2019-05-27 DIAGNOSIS — I959 Hypotension, unspecified: Secondary | ICD-10-CM | POA: Insufficient documentation

## 2019-05-27 DIAGNOSIS — G893 Neoplasm related pain (acute) (chronic): Secondary | ICD-10-CM | POA: Insufficient documentation

## 2019-05-27 DIAGNOSIS — Z5189 Encounter for other specified aftercare: Secondary | ICD-10-CM | POA: Diagnosis present

## 2019-05-27 DIAGNOSIS — C8338 Diffuse large B-cell lymphoma, lymph nodes of multiple sites: Secondary | ICD-10-CM

## 2019-05-27 DIAGNOSIS — R0602 Shortness of breath: Secondary | ICD-10-CM | POA: Insufficient documentation

## 2019-05-27 DIAGNOSIS — R1012 Left upper quadrant pain: Secondary | ICD-10-CM | POA: Insufficient documentation

## 2019-05-27 DIAGNOSIS — Z951 Presence of aortocoronary bypass graft: Secondary | ICD-10-CM | POA: Insufficient documentation

## 2019-05-27 DIAGNOSIS — R197 Diarrhea, unspecified: Secondary | ICD-10-CM | POA: Diagnosis not present

## 2019-05-27 DIAGNOSIS — Z5111 Encounter for antineoplastic chemotherapy: Secondary | ICD-10-CM | POA: Insufficient documentation

## 2019-05-27 DIAGNOSIS — I255 Ischemic cardiomyopathy: Secondary | ICD-10-CM | POA: Insufficient documentation

## 2019-05-27 DIAGNOSIS — F1411 Cocaine abuse, in remission: Secondary | ICD-10-CM | POA: Insufficient documentation

## 2019-05-27 DIAGNOSIS — R5383 Other fatigue: Secondary | ICD-10-CM | POA: Insufficient documentation

## 2019-05-27 DIAGNOSIS — Z79899 Other long term (current) drug therapy: Secondary | ICD-10-CM | POA: Diagnosis not present

## 2019-05-27 DIAGNOSIS — Z7982 Long term (current) use of aspirin: Secondary | ICD-10-CM | POA: Insufficient documentation

## 2019-05-27 DIAGNOSIS — K5900 Constipation, unspecified: Secondary | ICD-10-CM | POA: Insufficient documentation

## 2019-05-27 DIAGNOSIS — Z8673 Personal history of transient ischemic attack (TIA), and cerebral infarction without residual deficits: Secondary | ICD-10-CM | POA: Insufficient documentation

## 2019-05-27 DIAGNOSIS — Z7952 Long term (current) use of systemic steroids: Secondary | ICD-10-CM | POA: Insufficient documentation

## 2019-05-27 DIAGNOSIS — Z452 Encounter for adjustment and management of vascular access device: Secondary | ICD-10-CM | POA: Diagnosis not present

## 2019-05-27 DIAGNOSIS — I509 Heart failure, unspecified: Secondary | ICD-10-CM | POA: Diagnosis not present

## 2019-05-27 DIAGNOSIS — Z87891 Personal history of nicotine dependence: Secondary | ICD-10-CM | POA: Insufficient documentation

## 2019-05-27 DIAGNOSIS — I2510 Atherosclerotic heart disease of native coronary artery without angina pectoris: Secondary | ICD-10-CM | POA: Diagnosis not present

## 2019-05-27 DIAGNOSIS — R161 Splenomegaly, not elsewhere classified: Secondary | ICD-10-CM | POA: Diagnosis not present

## 2019-05-27 LAB — CBC WITH DIFFERENTIAL (CANCER CENTER ONLY)
Abs Immature Granulocytes: 0.1 10*3/uL — ABNORMAL HIGH (ref 0.00–0.07)
Band Neutrophils: 10 %
Basophils Absolute: 0.1 10*3/uL (ref 0.0–0.1)
Basophils Relative: 1 %
Eosinophils Absolute: 0 10*3/uL (ref 0.0–0.5)
Eosinophils Relative: 0 %
HCT: 30.3 % — ABNORMAL LOW (ref 39.0–52.0)
Hemoglobin: 10 g/dL — ABNORMAL LOW (ref 13.0–17.0)
Lymphocytes Relative: 1 %
Lymphs Abs: 0.1 10*3/uL — ABNORMAL LOW (ref 0.7–4.0)
MCH: 29.5 pg (ref 26.0–34.0)
MCHC: 33 g/dL (ref 30.0–36.0)
MCV: 89.4 fL (ref 80.0–100.0)
Metamyelocytes Relative: 1 %
Monocytes Absolute: 0.2 10*3/uL (ref 0.1–1.0)
Monocytes Relative: 3 %
Neutro Abs: 5.7 10*3/uL (ref 1.7–7.7)
Neutrophils Relative %: 84 %
Platelet Count: 291 10*3/uL (ref 150–400)
RBC: 3.39 MIL/uL — ABNORMAL LOW (ref 4.22–5.81)
RDW: 15.8 % — ABNORMAL HIGH (ref 11.5–15.5)
WBC Count: 6.1 10*3/uL (ref 4.0–10.5)
nRBC: 0 % (ref 0.0–0.2)

## 2019-05-27 LAB — CMP (CANCER CENTER ONLY)
ALT: 11 U/L (ref 0–44)
AST: 9 U/L — ABNORMAL LOW (ref 15–41)
Albumin: 2.9 g/dL — ABNORMAL LOW (ref 3.5–5.0)
Alkaline Phosphatase: 97 U/L (ref 38–126)
Anion gap: 9 (ref 5–15)
BUN: 15 mg/dL (ref 8–23)
CO2: 26 mmol/L (ref 22–32)
Calcium: 8.4 mg/dL — ABNORMAL LOW (ref 8.9–10.3)
Chloride: 102 mmol/L (ref 98–111)
Creatinine: 0.67 mg/dL (ref 0.61–1.24)
GFR, Est AFR Am: >60 mL/min (ref >60)
GFR, Estimated: >60 mL/min (ref >60)
Glucose, Bld: 170 mg/dL — ABNORMAL HIGH (ref 70–99)
Potassium: 4.1 mmol/L (ref 3.5–5.1)
Sodium: 137 mmol/L (ref 135–145)
Total Bilirubin: 0.4 mg/dL (ref 0.3–1.2)
Total Protein: 6.4 g/dL — ABNORMAL LOW (ref 6.5–8.1)

## 2019-05-27 NOTE — Addendum Note (Signed)
Addended by: [name] on: 05/27/2019 02:53 PM   Modules accepted: Orders

## 2019-05-27 NOTE — Progress Notes (Signed)
[name]       [name],[name] [id number]             [phone]     HPI: [name] returns for follow-up after a supraclavicular lymph node biopsy.  Bentzion Navarro is a 64 year old man with a history of coronary disease, coronary bypass grafting, cocaine abuse, ischemic cardiomyopathy, tobacco abuse, and adenopathy.  He presented initially in December with inguinal pain.  A needle biopsy of an enlarged node was negative.  PET/CT showed multiple areas of suspicion including a left supraclavicular lymph node.  I did a scalene node biopsy on 05/08/2019.  That was done as an outpatient.  The biopsy showed non-Hodgkin's lymphoma.  He has started chemotherapy.  He denies any pain or swelling at the biopsy site.  Past Medical History:  Diagnosis Date  . CAD (coronary artery disease)    a. 04/2008 Cath: LM nl, LAD 50p, 29m, LCX min irregs, RCA 50p/m, 40d, RPDA 60-70ost; b. 04/2008 CABG x 4: LIMA->LAD, VG->D2, VG->RPDA->RPL.  . Cancer ([place])   . Cocaine abuse ([place])    a. Quit early 2019.  . Ischemic cardiomyopathy    a. LV gram: EF 30-35%, apical/periapical AK.  . Stroke ([place]) 02/2019  . Stroke ([place]) 02/2019  . Tobacco abuse     Current Outpatient Medications  Medication Sig Dispense Refill  . acetaminophen (TYLENOL) 325 MG tablet Take 2 tablets (650 mg total) by mouth every 4 (four) hours as needed for mild pain (or temp > 37.5 C (99.5 F)).    Marland Kitchen allopurinol (ZYLOPRIM) 100 MG tablet Take 1 tablet (100 mg total) by mouth 2 (two) times daily. 60 tablet 2  . aspirin EC 81 MG tablet Take 1 tablet (81 mg total) by mouth daily. 30 tablet 3  . docusate sodium (COLACE) 100 MG capsule Take 1 capsule (100 mg total) by mouth every 12 (twelve) hours. 60 capsule 0  . fentaNYL (DURAGESIC) 25 MCG/HR Place 1 patch onto the skin every 3 (three) days. 5 patch 0  . lidocaine-prilocaine (EMLA) cream Apply 1 application topically as needed. Apply a teaspoon amount to port site an hour prior  to chemo therapy, DO NOT rub in, cover with plastic wrap. 30 g 0  . oxyCODONE-acetaminophen (PERCOCET/ROXICET) 5-325 MG tablet Take 1 tablet by mouth every 6 (six) hours as needed for severe pain. 30 tablet 0  . polyethylene glycol (MIRALAX / GLYCOLAX) 17 g packet Take 17 g by mouth daily. 14 each 0  . predniSONE (DELTASONE) 50 MG tablet 2 tablet p.o. daily for 5 days start with the first day of every chemotherapy cycle. 80 tablet 0  . prochlorperazine (COMPAZINE) 10 MG tablet Take 1 tablet (10 mg total) by mouth every 6 (six) hours as needed for nausea or vomiting. 30 tablet 0   No current facility-administered medications for this visit.    Physical Exam BP 95/60 (BP Location: Right Arm, Patient Position: Sitting, Cuff Size: Normal)   Pulse 80   Temp 97.9 F (36.6 C) (Temporal)   Resp 20   Ht 5\' 7"  (1.702 m)   Wt 112 lb (50.8 kg)   SpO2 96% Comment: RA  BMI 17.54 kg/m  Chronically ill-appearing 64 year old man in no acute distress Left neck incision healing well  Impression: [name] is a 64 year old man who had a left supraclavicular lymph node biopsy for diagnostic purposes on 05/08/2019.  That showed a non-Hodgkin's large B-cell lymphoma.  He did not have  any complications related to the procedure.  He is not having any pain at the site currently.  Plan: Follow-up with Dr. Julien Nordmann.  I will be happy to see [name] again if I can be of any further assistance with his care.  [name], MD [facility] [phone]

## 2019-05-27 NOTE — Progress Notes (Signed)
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   PRIOR THERAPY: None  CURRENT THERAPY: Systemic chemotherapy with R-CHOP every 3 weeks.  First dose May 20, 2019. Status post 1 cycle.   INTERVAL HISTORY: [name] 64 y.o. male returns to the clinic today for a follow-up visit accompanied by his sister.  The patient is feeling fairly well today without any concerning complaints.  He is status post his first cycle of treatment and he tolerated it fairly well except for mild nausea and one episode of vomiting over the weekend.  His appetite is improving and he gained 9 pounds since his last appointment.  He denies any recent fever, chills, or night sweats.  He notes shortness of breath with exertion every once in a while.  He denies any cough.  He denies any chest pain or hemoptysis.  He still experiences intermittent left upper quadrant pain likely secondary to his splenomegaly.  He had one episode of diarrhea yesterday without any evidence of blood or mucus.  He denies any headache or visual changes.  He denies any peripheral neuropathy.  He is here today for evaluation and a 1 week follow-up visit after completing her first cycle of chemotherapy.  MEDICAL HISTORY: Past Medical History:  Diagnosis Date  . CAD (coronary artery disease)    a. 04/2008 Cath: LM nl, LAD 50p, 17m LCX min irregs, RCA 50p/m, 40d, RPDA 60-70ost; b. 04/2008 CABG x 4: LIMA->LAD, VG->D2, VG->RPDA->RPL.  . Cancer ([place]   . [place] ([place]    a. Quit  early 2019.  . Ischemic cardiomyopathy    a. LV gram: EF 30-35%, apical/periapical AK.  . St[place] ([place] 02[place]  . [place] ([place] 02/[place]  . Tobacco abuse     ALLERGIES:  has No Known Allergies.  MEDICATIONS:  Current Outpatient Medications  Medication Sig Dispense Refill  . acetaminophen (TYLENOL) 325 MG tablet Take 2 tablets (650 mg total) by mouth every 4 (four) hours as needed for mild pain (or temp > 37.5 C (99.5 F)).    .Marland Kitchenallopurinol (ZYLOPRIM) 100 MG tablet Take 1 tablet (100 mg total) by mouth 2 (two) times daily. 60 tablet 2  . aspirin EC 81 MG tablet Take 1 tablet (81 mg total) by mouth daily. 30 tablet 3  . docusate sodium (COLACE) 100 MG capsule Take 1 capsule (100 mg total) by mouth every 12 (twelve) hours. 60 capsule 0  . fentaNYL (DURAGESIC) 25 MCG/HR Place 1 patch onto the skin every 3 (three) days. 5 patch 0  . lidocaine-prilocaine (EMLA) cream Apply 1 application topically as needed. Apply a teaspoon amount to port site an hour prior to chemo therapy, DO NOT rub in, cover with plastic wrap. 30 g 0  . oxyCODONE-acetaminophen (PERCOCET/ROXICET) 5-325 MG tablet Take 1 tablet by mouth every 6 (six) hours as needed for severe pain. 30 tablet 0  . polyethylene glycol (MIRALAX / GLYCOLAX) 17 g packet Take 17 g by mouth daily. 14 each 0  . predniSONE (DELTASONE) 50 MG tablet 2 tablet p.o. daily for 5 days start  with the first day of every chemotherapy cycle. 80 tablet 0  . prochlorperazine (COMPAZINE) 10 MG tablet Take 1 tablet (10 mg total) by mouth every 6 (six) hours as needed for nausea or vomiting. 30 tablet 0   No current facility-administered medications for this visit.    SURGICAL HISTORY:  Past Surgical History:  Procedure Laterality Date  . BYPASS GRAFT  2010  . CARDIAC SURGERY  2010  . IR IMAGING GUIDED PORT INSERTION  05/26/2019  . SUPRACLAVICAL NODE BIOPSY Left 05/08/2019   Procedure: SUPRACLAVICAL NODE BIOPSY;  Surgeon: [name], MD;  Location: [facility];   Service: Thoracic;  Laterality: Left;    REVIEW OF SYSTEMS:   Review of Systems  Constitutional: Positive for fatigue. Negative for appetite change, chills, fever and unexpected weight change.  HENT: Negative for mouth sores, nosebleeds, sore throat and trouble swallowing.   Eyes: Negative for eye problems and icterus.  Respiratory: Positive for baseline shortness of breath. Negative for cough, hemoptysis, and wheezing.   Cardiovascular: Negative for chest pain and leg swelling.  Gastrointestinal: Positive for mild nausea and 1x vomiting (resolved). Positive for 1x diarrhea. Positive for LUQ pain secondary to splenomegaly. Negative for Constipation.  Genitourinary: Negative for bladder incontinence, difficulty urinating, dysuria, frequency and hematuria.   Musculoskeletal: Negative for back pain, gait problem, neck pain and neck stiffness.  Skin: Negative for itching and rash.  Neurological: Positive for generalized weakness. Negative for dizziness, extremity weakness, gait problem, headaches, light-headedness and seizures.  Hematological: Negative for adenopathy. Does not bruise/bleed easily.  Psychiatric/Behavioral: Negative for confusion, depression and sleep disturbance. The patient is not nervous/anxious.     PHYSICAL EXAMINATION:  Blood pressure (!) 87/59, pulse 79, temperature 97.8 F (36.6 C), temperature source Temporal, resp. rate 18, height '5\' 7"'  (1.702 m), weight 112 lb 11.2 oz (51.1 kg), SpO2 99 %.  ECOG PERFORMANCE STATUS: 1 - Symptomatic but completely ambulatory  Physical Exam  Constitutional: Oriented to person, place, and time and thin and chronically ill appearing male and in no distress.  HENT:  Head: Normocephalic and atraumatic.  Mouth/Throat: Oropharynx is clear and moist. No oropharyngeal exudate.  Eyes: Conjunctivae are normal. Right eye exhibits no discharge. Left eye exhibits no discharge. No scleral icterus.  Neck: Normal range of motion. Neck supple.   Cardiovascular: Normal rate, regular rhythm, normal heart sounds and intact distal pulses.   Pulmonary/Chest: Effort normal and breath sounds normal. No respiratory distress. No wheezes. No rales.  Abdominal: Soft. Bowel sounds are normal. Exhibits no distension and no mass. There is no tenderness.  Musculoskeletal: Normal range of motion. Exhibits no edema.  Lymphadenopathy:    No cervical adenopathy.  Neurological: Alert and oriented to person, place, and time. Exhibits normal muscle tone. Gait normal. Coordination normal.  Skin: Skin is warm and dry. No rash noted. Not diaphoretic. No erythema. No pallor.  Psychiatric: Mood, memory and judgment normal.  Vitals reviewed.  LABORATORY DATA: Lab Results  Component Value Date   WBC 6.1 05/27/2019   HGB 10.0 (L) 05/27/2019   HCT 30.3 (L) 05/27/2019   MCV 89.4 05/27/2019   PLT 291 05/27/2019      Chemistry      Component Value Date/Time   NA 137 05/27/2019 0911   K 4.1 05/27/2019 0911   CL 102 05/27/2019 0911   CO2 26 05/27/2019 0911   BUN 15 05/27/2019 0911   CREATININE 0.67 05/27/2019 0911      Component Value Date/Time   CALCIUM 8.4 (  L) 05/27/2019 0911   ALKPHOS 97 05/27/2019 0911   AST 9 (L) 05/27/2019 0911   ALT 11 05/27/2019 0911   BILITOT 0.4 05/27/2019 0911       RADIOGRAPHIC STUDIES:  DG Chest 2 View  Result Date: 05/08/2019 CLINICAL DATA:  Preop testing EXAM: CHEST - 2 VIEW COMPARISON:  03/04/2019 FINDINGS: Grossly stable large mass at the right base. Normal heart size with stable aortic tortuosity. There has been CABG. Mild scar-like streaky density at the bases. IMPRESSION: No acute finding when compared with prior. Electronically Signed   By: Monte Fantasia M.D.   On: 05/08/2019 06:34   IR IMAGING GUIDED PORT INSERTION  Result Date: 05/26/2019 INDICATION: History of lymphoma. In need of durable intravenous access for chemotherapy administration. EXAM: IMPLANTED PORT A CATH PLACEMENT WITH ULTRASOUND AND  FLUOROSCOPIC GUIDANCE COMPARISON:  PET-CT-04/18/2019 MEDICATIONS: Ancef 2 gm IV; The antibiotic was administered within an appropriate time interval prior to skin puncture. ANESTHESIA/SEDATION: Moderate (conscious) sedation was employed during this procedure. A total of Versed 2 mg and Fentanyl 100 mcg was administered intravenously. Moderate Sedation Time: 24 minutes. The patient's level of consciousness and vital signs were monitored continuously by radiology nursing throughout the procedure under my direct supervision. CONTRAST:  None FLUOROSCOPY TIME:  18 seconds (2 mGy) COMPLICATIONS: None immediate. PROCEDURE: The procedure, risks, benefits, and alternatives were explained to the patient. Questions regarding the procedure were encouraged and answered. The patient understands and consents to the procedure. The right neck and chest were prepped with chlorhexidine in a sterile fashion, and a sterile drape was applied covering the operative field. Maximum barrier sterile technique with sterile gowns and gloves were used for the procedure. A timeout was performed prior to the initiation of the procedure. Local anesthesia was provided with 1% lidocaine with epinephrine. After creating a small venotomy incision, a micropuncture kit was utilized to access the internal jugular vein. Real-time ultrasound guidance was utilized for vascular access including the acquisition of a permanent ultrasound image documenting patency of the accessed vessel. The microwire was utilized to measure appropriate catheter length. A subcutaneous port pocket was then created along the upper chest wall utilizing a combination of sharp and blunt dissection. The pocket was irrigated with sterile saline. A single lumen "Slim" sized power injectable port was chosen for placement. The 8 Fr catheter was tunneled from the port pocket site to the venotomy incision. The port was placed in the pocket. The external catheter was trimmed to appropriate  length. At the venotomy, an 8 Fr peel-away sheath was placed over a guidewire under fluoroscopic guidance. The catheter was then placed through the sheath and the sheath was removed. Final catheter positioning was confirmed and documented with a fluoroscopic spot radiograph. The port was accessed with a Huber needle, aspirated and flushed with heparinized saline. The venotomy site was closed with an interrupted 4-0 Vicryl suture. The port pocket incision was closed with interrupted 2-0 Vicryl suture and the skin was opposed with a running subcuticular 4-0 Vicryl suture. Dermabond and Steri-strips were applied to both incisions. Dressings were placed. The patient tolerated the procedure well without immediate post procedural complication. FINDINGS: After catheter placement, the tip lies within the superior cavoatrial junction. The catheter aspirates and flushes normally and is ready for immediate use. IMPRESSION: Successful placement of a right internal jugular approach power injectable Port-A-Cath. The catheter is ready for immediate use. Electronically Signed   By: Sandi Mariscal M.D.   On: 05/26/2019 12:34   ECHOCARDIOGRAM LIMITED  Result Date: 05/20/2019    ECHOCARDIOGRAM LIMITED REPORT   Patient Name:   [name] Date of Exam: 05/16/2019 Medical Rec #:  [id number]       Height:       67.0 in Accession #:    [id number]      Weight:       103.5 lb Date of Birth:  Sep 20, 1955       BSA:          1.53 m Patient Age:    72 years        BP:           104/64 mmHg Patient Gender: M               HR:           83 bpm. Exam Location:  Outpatient Procedure: Limited Echo, Cardiac Doppler, Limited Color Doppler and Strain            Analysis Indications:    Z51.11 Encounter for antineoplastic chemotheraphy  History:        Patient has prior history of Echocardiogram examinations, most                 recent 03/06/2019. Cardiomyopathy, CAD, Stroke; Risk                 Factors:Former Smoker. Cocaine abuse.  Sonographer:     Tiffany Dance Referring Phys: 61 [place]  1. Left ventricular ejection fraction, by estimation, is 40 to 45%. The left ventricle has mildly decreased function. The left ventricle demonstrates regional wall motion abnormalities. There is mild left ventricular hypertrophy. Left ventricular diastolic parameters are indeterminate. There is moderate akinesis of the left ventricular, apical anteroseptal wall and apical segment. There is severe hypokinesis of the left ventricular, basal inferior wall.  2. Right ventricular systolic function is normal. The right ventricular size is normal.  3. The mitral valve is abnormal. Trivial mitral valve regurgitation. No evidence of mitral stenosis.  4. The aortic valve is normal in structure and function. Aortic valve regurgitation is not visualized. No aortic stenosis is present. FINDINGS  Left Ventricle: The LV cavity appears to be slighly smaller compared to previous echo 03/05/19. The LV function appears to be unchanged. Left ventricular ejection fraction, by estimation, is 40 to 45%. The left ventricle has mildly decreased function. The left ventricle demonstrates regional wall motion abnormalities. Moderate akinesis of the left ventricular, apical anteroseptal wall and apical segment. Severe hypokinesis of the left ventricular, basal inferior wall. There is mild left ventricular hypertrophy. Right Ventricle: The right ventricular size is normal. No increase in right ventricular wall thickness. Right ventricular systolic function is normal. Left Atrium: Left atrial size was normal in size. Right Atrium: Right atrial size was normal in size. Pericardium: There is no evidence of pericardial effusion. Mitral Valve: The mitral valve is abnormal. There is moderate thickening of the mitral valve leaflet(s). Trivial mitral valve regurgitation. No evidence of mitral valve stenosis. Tricuspid Valve: The tricuspid valve is grossly normal. Tricuspid valve  regurgitation is mild. Aortic Valve: The aortic valve is normal in structure and function. Aortic valve regurgitation is not visualized. No aortic stenosis is present. Pulmonic Valve: The pulmonic valve was normal in structure. Pulmonic valve regurgitation is not visualized. No evidence of pulmonic stenosis. Aorta: The aortic root and ascending aorta are structurally normal, with no evidence of dilitation. IAS/Shunts: The atrial septum is grossly normal.  LEFT VENTRICLE PLAX 2D LVIDd:  3.90 cm LVIDs:         3.20 cm  2D Longitudinal Strain LV PW:         1.40 cm  2D Strain GLS Avg:     -16.8 % LV IVS:        1.00 cm LVOT diam:     2.00 cm LV SV:         50.27 ml LV SV Index:   16.93 LVOT Area:     3.14 cm  RIGHT VENTRICLE          IVC RV Basal diam:  2.70 cm  IVC diam: 1.20 cm TAPSE (M-mode): 1.7 cm LEFT ATRIUM           Index       RIGHT ATRIUM           Index LA diam:      4.20 cm 2.75 cm/m  RA Area:     12.70 cm LA Vol (A2C): 36.2 ml 23.68 ml/m RA Volume:   32.50 ml  21.26 ml/m LA Vol (A4C): 40.2 ml 26.30 ml/m  AORTIC VALVE LVOT Vmax:   79.40 cm/s LVOT Vmean:  52.100 cm/s LVOT VTI:    0.160 m  AORTA Ao Root diam: 3.80 cm Ao Asc diam:  3.50 cm MITRAL VALVE MV Area (PHT): 3.31 cm    SHUNTS MV Decel Time: 229 msec    Systemic VTI:  0.16 m MV E velocity: 57.50 cm/s  Systemic Diam: 2.00 cm MV A velocity: 66.30 cm/s MV E/A ratio:  0.87 Mertie Moores MD Electronically signed by Mertie Moores MD Signature Date/Time: 05/20/2019/10:16:04 AM    Final      ASSESSMENT/PLAN:  This is a very pleasant 64 year old African-American male recently diagnosed with stage IV large B cell non-Hodgkin's lymphoma.  He was diagnosed in February 2021.  He presented with extensive adenopathy in the abdomen and pelvis with a bulky right perihilar mass and left supraclavicular lymphadenopathy and spleen enlargement.  He was diagnosed in February 2021.  The patient is currently undergoing systemic chemotherapy with R-CHOP  with Neulasta support IV every 3 weeks.  He is status post his first cycle and tolerated it well without any adverse side effects except for mild nausea.   The patient was seen with Dr. Julien Nordmann today.  Labs were reviewed.  Recommend the patient continue on treatment at the same dose.   He will receive cycle #2 in 2 weeks as scheduled.  We will see the patient back for follow-up visit in 2 weeks for evaluation and repeat blood work before proceeding with cycle #2.  The patient is a little hypotensive today.  He states that he is not drinking much water.  Strongly encouraged to increase his fluid intake with water upon returning home today.  For his pain, he will continue using oxycodone and his fentanyl patches for his cancer associated abdominal pain.  The patient is requesting a follow-up with social work regarding concerns related to his housing situation.  I have sent them a message requesting to give the patient a call.  He will use compazine as needed for nausea.   The patient was advised to call immediately if she has any concerning symptoms in the interval. The patient voices understanding of current disease status and treatment options and is in agreement with the current care plan. All questions were answered. The patient knows to call the clinic with any problems, questions or concerns. We can certainly see the patient much sooner  if necessary.   No orders of the defined types were placed in this encounter.    Katha Kuehne L Eloise Mula, PA-C 05/27/19  ADDENDUM: Hematology/Oncology Attending: I had a face-to-face encounter with the patient today.  I recommended his care plan.  This is a very pleasant 64 years old African-American male recently diagnosed with large B cell non-Hodgkin lymphoma with bulky and extensive disease in the chest, abdomen and pelvis. The patient started the first cycle of systemic chemotherapy with CHOP/Rituxan last week he tolerated the first week of  his treatment well with no concerning adverse effects.  The patient is feeling much better today and he gained several pounds since his last visit.  He denied having any significant nausea or vomiting.  He denied having any current chest pain but continues to have shortness of breath with exertion. His lab work is fair today. I recommended for the patient to continue his treatment and he will start cycle #2 of his treatment in 2 weeks.  We will continue to monitor his lab work closely on a weekly basis. For pain management he will continue on fentanyl patch and oxycodone as needed. The patient was advised to call immediately if he has any other concerning symptoms in the interval.  Disclaimer: This note was dictated with voice recognition software. Similar sounding words can inadvertently be transcribed and may be missed upon review. Eilleen Kempf, MD 05/27/19

## 2019-05-28 ENCOUNTER — Telehealth: Payer: Self-pay | Admitting: Physician Assistant

## 2019-05-28 ENCOUNTER — Encounter: Payer: Self-pay | Admitting: General Practice

## 2019-05-28 NOTE — Telephone Encounter (Signed)
Scheduled appts per 3/2 los. Put in appt notes for pt to get an updated calendar at next appt

## 2019-05-28 NOTE — Progress Notes (Signed)
[place] CSW Progress Notes  Call to patient at request of treatment team, patient is requesting to speak w social worker by phone.  Called patient - he is requesting help w paying his hotel bill at [facility].  States that his disability is "messed up" and he is receiving less than half of what he originally was told he would receive.  Advised that he needs to contact [place] directly to attempt to understand the reason for the decrease and see if he can do anything to rectify this.  He has moved [place] a placement at [place]" to [employer] on [address].  He states he moved into the hotel by airport because he could not afford his former lodging.  At this point, he cannot afford the month's rent at [place] - is requesting help from the [place].  Asked that I call his niece to explain process of applying for this - per chart notes, he has met w [employer] and has not completed the application process.  Call placed to niece, VM left asking her to call me.  Message sent to [employer] to clarify what is needed for him to apply.  Edwyna Shell, LCSW Clinical Social Worker Phone:  [phone] Cell:  [phone]

## 2019-05-29 ENCOUNTER — Telehealth: Payer: Self-pay | Admitting: General Practice

## 2019-05-29 ENCOUNTER — Other Ambulatory Visit: Payer: Self-pay | Admitting: Thoracic Surgery (Cardiothoracic Vascular Surgery)

## 2019-05-29 DIAGNOSIS — R918 Other nonspecific abnormal finding of lung field: Secondary | ICD-10-CM

## 2019-05-29 NOTE — Telephone Encounter (Signed)
[place] CSW Progress Notes  Returned call to niece Tory Septer - she wanted to know if patient can be placed in assisted living or SNF.  States that patient is now living in hotel, fell last night and had no one to help him.  Niece lives 1.5 hours away, local family members

## 2019-05-29 NOTE — Telephone Encounter (Signed)
[place] CSW Progress Note  Clarified that patient had worked w Product/process development scientist [employer]) to find discharge plan after shelter diversion/hotel placement.  SNF was suggested at that time, but patient was ambulatory and living in hotel.  Patient also was not interested in SNF placement.  Emailed niece Fish farm manager for [employer] elderly/disabled properties - encouraged application as there are vacancies currently and properties are income based.  Edwyna Shell, LCSW Clinical Social Worker Phone:  [phone]

## 2019-05-29 NOTE — Telephone Encounter (Signed)
[place] CSW Progress Notes  Returned call to Debbora Dus, patient's niece.  She is concerned that patient fell last night at hotel, could not get up.  Is calling repeatedly to his sister/her aunt.  Aunt is physically limited herself and cannot provide any kind of physical care for patient.  Patient is currently living in hotel where he is paying for his own lodgings.  Prior to 3/1, he was in the shelters diversion program of the [employer].  Had been living in a hotel funded by special program designed to limit occupancy of congregate homeless shelters.  This program ended 3/1 and all participants had to vacate their rooms and find other accommodations.  Patient is mobile, able to get around unaided, able to take care of his ADLs.  Inpatient PT assessment in 12/20 recommended home health/outpatient PT services post acute hospitalization, but did not patient had difficulties w balance and had falls.  Per niece, patient has been resistant to entering any kind of placement and liked to remain independent - however fall last night may have changed his thinking.  CSW discussed ramifications of placement in ALF/FCH - placement takes patient's SSI check except for a small amount for incidental expenses.    Also discussed possibility of elderly/disabled housing situation where residents have some supportive services but retain an independent living situation.  CSW verified that vacancies exist at facilities maintained by [employer] = these are income based properties w supportive services.  Patient could have Medicaid [place] while in this kind of independent living situation.  He will need to apply online w [employer] - asked niece to provide an email address so I can forward the application to him/her.  Left VM w niece requesting this information, waiting her reply.   Edwyna Shell, LCSW Clinical Social Worker Phone:  [phone] Cell:   [phone]

## 2019-06-02 ENCOUNTER — Encounter: Payer: Self-pay | Admitting: *Deleted

## 2019-06-02 NOTE — Progress Notes (Signed)
[place] Work  Clinical Social Work attempted to contact patients niece by phone to follow up on resources provided by CSW team last week.  CSW left a voicemail offering support and encouraging patients niece to return call.    Johnnye Lana, MSW, LCSW, OSW-C Clinical Social Worker [facility] [phone]

## 2019-06-03 ENCOUNTER — Encounter: Payer: Self-pay | Admitting: Internal Medicine

## 2019-06-03 ENCOUNTER — Inpatient Hospital Stay: Payer: Medicaid Other

## 2019-06-03 ENCOUNTER — Other Ambulatory Visit: Payer: Self-pay

## 2019-06-03 ENCOUNTER — Telehealth: Payer: Self-pay | Admitting: Medical Oncology

## 2019-06-03 ENCOUNTER — Encounter: Payer: Self-pay | Admitting: *Deleted

## 2019-06-03 ENCOUNTER — Other Ambulatory Visit: Payer: Self-pay | Admitting: Internal Medicine

## 2019-06-03 DIAGNOSIS — R918 Other nonspecific abnormal finding of lung field: Secondary | ICD-10-CM

## 2019-06-03 DIAGNOSIS — C8338 Diffuse large B-cell lymphoma, lymph nodes of multiple sites: Secondary | ICD-10-CM

## 2019-06-03 DIAGNOSIS — G893 Neoplasm related pain (acute) (chronic): Secondary | ICD-10-CM

## 2019-06-03 DIAGNOSIS — Z95828 Presence of other vascular implants and grafts: Secondary | ICD-10-CM

## 2019-06-03 DIAGNOSIS — Z452 Encounter for adjustment and management of vascular access device: Secondary | ICD-10-CM | POA: Diagnosis not present

## 2019-06-03 LAB — CMP (CANCER CENTER ONLY)
ALT: 12 U/L (ref 0–44)
AST: 9 U/L — ABNORMAL LOW (ref 15–41)
Albumin: 3.2 g/dL — ABNORMAL LOW (ref 3.5–5.0)
Alkaline Phosphatase: 105 U/L (ref 38–126)
Anion gap: 6 (ref 5–15)
BUN: 16 mg/dL (ref 8–23)
CO2: 25 mmol/L (ref 22–32)
Calcium: 8.9 mg/dL (ref 8.9–10.3)
Chloride: 104 mmol/L (ref 98–111)
Creatinine: 0.7 mg/dL (ref 0.61–1.24)
GFR, Est AFR Am: >60 mL/min (ref >60)
GFR, Estimated: >60 mL/min (ref >60)
Glucose, Bld: 77 mg/dL (ref 70–99)
Potassium: 4.1 mmol/L (ref 3.5–5.1)
Sodium: 135 mmol/L (ref 135–145)
Total Bilirubin: 0.2 mg/dL — ABNORMAL LOW (ref 0.3–1.2)
Total Protein: 7 g/dL (ref 6.5–8.1)

## 2019-06-03 LAB — CBC WITH DIFFERENTIAL (CANCER CENTER ONLY)
Abs Immature Granulocytes: 1.34 10*3/uL — ABNORMAL HIGH (ref 0.00–0.07)
Basophils Absolute: 0.2 10*3/uL — ABNORMAL HIGH (ref 0.0–0.1)
Basophils Relative: 2 %
Eosinophils Absolute: 0 10*3/uL (ref 0.0–0.5)
Eosinophils Relative: 0 %
HCT: 33.4 % — ABNORMAL LOW (ref 39.0–52.0)
Hemoglobin: 10.7 g/dL — ABNORMAL LOW (ref 13.0–17.0)
Immature Granulocytes: 14 %
Lymphocytes Relative: 8 %
Lymphs Abs: 0.8 10*3/uL (ref 0.7–4.0)
MCH: 29.2 pg (ref 26.0–34.0)
MCHC: 32 g/dL (ref 30.0–36.0)
MCV: 91 fL (ref 80.0–100.0)
Monocytes Absolute: 0.8 10*3/uL (ref 0.1–1.0)
Monocytes Relative: 9 %
Neutro Abs: 6.2 10*3/uL (ref 1.7–7.7)
Neutrophils Relative %: 67 %
Platelet Count: 141 10*3/uL — ABNORMAL LOW (ref 150–400)
RBC: 3.67 MIL/uL — ABNORMAL LOW (ref 4.22–5.81)
RDW: 18.9 % — ABNORMAL HIGH (ref 11.5–15.5)
WBC Count: 9.3 10*3/uL (ref 4.0–10.5)
nRBC: 1 % — ABNORMAL HIGH (ref 0.0–0.2)

## 2019-06-03 MED ORDER — OXYCODONE-ACETAMINOPHEN 5-325 MG PO TABS: 1.0000 | ORAL_TABLET | Freq: Four times a day (QID) | ORAL | 0 refills | Status: DC | PRN

## 2019-06-03 MED ORDER — HEPARIN SOD (PORK) LOCK FLUSH 100 UNIT/ML IV SOLN
500.0000 [IU] | Freq: Once | INTRAVENOUS | Status: AC
  Administered 2019-06-03: 500 [IU] via INTRAVENOUS
  Filled 2019-06-03: qty 5

## 2019-06-03 MED ORDER — SODIUM CHLORIDE 0.9% FLUSH
10.0000 mL | INTRAVENOUS | Status: DC | PRN
  Administered 2019-06-03: 10 mL via INTRAVENOUS
  Filled 2019-06-03: qty 10

## 2019-06-03 MED ORDER — FENTANYL 25 MCG/HR TD PT72: 1.0000 | MEDICATED_PATCH | TRANSDERMAL | 0 refills | Status: DC

## 2019-06-03 NOTE — Telephone Encounter (Signed)
requesting refill for Fentanyl and oxycodone. Reviewed prednisone dosing instructions with Verdene Lennert. She will review with pt.

## 2019-06-03 NOTE — Progress Notes (Signed)
Met with patient and sister whom was to bring proof of income for [employer].  Patient states he didn't remember to bring. His sister states she will help him bring on Tuesday 3/16.   She has my card for any additional financial questions or concerns.

## 2019-06-03 NOTE — Progress Notes (Signed)
[place] Work  Holiday representative spoke with patients niece and confirmed that she submitted patients application to the [employer].  CSW contact [employer] authority to confirm.  CSW left a voicemail with Ms. Christian, and is awaiting return call.  Johnnye Lana, MSW, LCSW, OSW-C Clinical Social Worker [facility] [phone]

## 2019-06-06 ENCOUNTER — Telehealth: Payer: Self-pay | Admitting: *Deleted

## 2019-06-06 DIAGNOSIS — I255 Ischemic cardiomyopathy: Secondary | ICD-10-CM

## 2019-06-06 NOTE — Telephone Encounter (Signed)
[name] returning call Pam's call.

## 2019-06-06 NOTE — Telephone Encounter (Signed)
Let's get an ECHO in 3 months since he is getting doxorubicin. Thanks Candee Furbish, MD

## 2019-06-06 NOTE — Telephone Encounter (Signed)
Scheduler will call to schedule the echo.

## 2019-06-06 NOTE — Telephone Encounter (Signed)
Jerline Pain, MD  Heilingoetter, Tobe Sos, PA-C; Shellia Cleverly, RN  Thanks for the update.  It would be a good idea for [place] to get him in for follow up ECHO.  Thanks   Elta Guadeloupe       Previous Messages   ----- Message -----  From: Heilingoetter, Tobe Sos, PA-C  Sent: 06/04/2019  2:47 PM EST  To: Jerline Pain, MD  Subject: Mutual patient-FYI                Good Afternoon,   I have a mutual patient who you see for ischemic cardiomyopathy. His last EF from 05/16/2019 was 40-45%. It looks like he may have been lost to follow up with you since 2020. He was recently diagnosed with stage IV non-hodgkin's lymphoma. He is currently undergoing chemotherapy, including with 50% dose reduced doxorubicin. Given the cardiotoxicity associated with doxorubicin, I just wanted to keep you in the loop to see if you needed to keep a close eye on him as we get him through his treatment. Have a wonderful day. Thank you.   -Cassie

## 2019-06-06 NOTE — Telephone Encounter (Signed)
Spoke with patient who was unaware he was to have an echocardiogram.  Explained to him the reasoning behind it and he will be called to be scheduled for it.  Advised it will be completed here at our office on [facility] and described the test itself.  Pt requested I call his niece Verdene Lennert at [phone] to make her aware.  Left message on [name]'s VM as requested.  I review of pt's chart I do see he recently had a 2 D Echo on 05/16/2019 and 03/06/2019.  Will have Dr Marlou Porch review to see if another echo is needed at this time (less than 1 month) or when the time appropriate time will be.

## 2019-06-10 ENCOUNTER — Inpatient Hospital Stay: Payer: Medicaid Other

## 2019-06-10 ENCOUNTER — Other Ambulatory Visit: Payer: Self-pay

## 2019-06-10 ENCOUNTER — Encounter: Payer: Self-pay | Admitting: Internal Medicine

## 2019-06-10 ENCOUNTER — Encounter: Payer: Self-pay | Admitting: *Deleted

## 2019-06-10 ENCOUNTER — Inpatient Hospital Stay (HOSPITAL_BASED_OUTPATIENT_CLINIC_OR_DEPARTMENT_OTHER): Payer: Medicaid Other | Admitting: Internal Medicine

## 2019-06-10 VITALS — BP 102/70 | HR 79 | Temp 98.5°F | Resp 18 | Ht 67.0 in | Wt 112.4 lb

## 2019-06-10 VITALS — BP 93/69 | HR 74 | Temp 98.0°F | Resp 16

## 2019-06-10 DIAGNOSIS — I10 Essential (primary) hypertension: Secondary | ICD-10-CM | POA: Diagnosis not present

## 2019-06-10 DIAGNOSIS — Z95828 Presence of other vascular implants and grafts: Secondary | ICD-10-CM

## 2019-06-10 DIAGNOSIS — C8338 Diffuse large B-cell lymphoma, lymph nodes of multiple sites: Secondary | ICD-10-CM

## 2019-06-10 DIAGNOSIS — Z5111 Encounter for antineoplastic chemotherapy: Secondary | ICD-10-CM | POA: Diagnosis not present

## 2019-06-10 DIAGNOSIS — Z452 Encounter for adjustment and management of vascular access device: Secondary | ICD-10-CM | POA: Diagnosis not present

## 2019-06-10 LAB — CBC WITH DIFFERENTIAL (CANCER CENTER ONLY)
Abs Immature Granulocytes: 0.15 10*3/uL — ABNORMAL HIGH (ref 0.00–0.07)
Basophils Absolute: 0.1 10*3/uL (ref 0.0–0.1)
Basophils Relative: 1 %
Eosinophils Absolute: 0 10*3/uL (ref 0.0–0.5)
Eosinophils Relative: 0 %
HCT: 34.4 % — ABNORMAL LOW (ref 39.0–52.0)
Hemoglobin: 11.3 g/dL — ABNORMAL LOW (ref 13.0–17.0)
Immature Granulocytes: 2 %
Lymphocytes Relative: 13 %
Lymphs Abs: 0.8 10*3/uL (ref 0.7–4.0)
MCH: 30.3 pg (ref 26.0–34.0)
MCHC: 32.8 g/dL (ref 30.0–36.0)
MCV: 92.2 fL (ref 80.0–100.0)
Monocytes Absolute: 0.8 10*3/uL (ref 0.1–1.0)
Monocytes Relative: 11 %
Neutro Abs: 4.8 10*3/uL (ref 1.7–7.7)
Neutrophils Relative %: 73 %
Platelet Count: 436 10*3/uL — ABNORMAL HIGH (ref 150–400)
RBC: 3.73 MIL/uL — ABNORMAL LOW (ref 4.22–5.81)
RDW: 19.5 % — ABNORMAL HIGH (ref 11.5–15.5)
WBC Count: 6.6 10*3/uL (ref 4.0–10.5)
nRBC: 0 % (ref 0.0–0.2)

## 2019-06-10 LAB — CMP (CANCER CENTER ONLY)
ALT: 16 U/L (ref 0–44)
AST: 14 U/L — ABNORMAL LOW (ref 15–41)
Albumin: 3.3 g/dL — ABNORMAL LOW (ref 3.5–5.0)
Alkaline Phosphatase: 85 U/L (ref 38–126)
Anion gap: 9 (ref 5–15)
BUN: 16 mg/dL (ref 8–23)
CO2: 24 mmol/L (ref 22–32)
Calcium: 9.1 mg/dL (ref 8.9–10.3)
Chloride: 106 mmol/L (ref 98–111)
Creatinine: 0.73 mg/dL (ref 0.61–1.24)
GFR, Est AFR Am: >60 mL/min (ref >60)
GFR, Estimated: >60 mL/min (ref >60)
Glucose, Bld: 99 mg/dL (ref 70–99)
Potassium: 3.6 mmol/L (ref 3.5–5.1)
Sodium: 139 mmol/L (ref 135–145)
Total Bilirubin: <0.2 mg/dL — ABNORMAL LOW (ref 0.3–1.2)
Total Protein: 7 g/dL (ref 6.5–8.1)

## 2019-06-10 LAB — URIC ACID: Uric Acid, Serum: 4.1 mg/dL (ref 3.7–8.6)

## 2019-06-10 MED ORDER — DIPHENHYDRAMINE HCL 25 MG PO CAPS
50.0000 mg | ORAL_CAPSULE | Freq: Once | ORAL | Status: AC
  Administered 2019-06-10: 50 mg via ORAL

## 2019-06-10 MED ORDER — SODIUM CHLORIDE 0.9 % IV SOLN
750.0000 mg/m2 | Freq: Once | INTRAVENOUS | Status: AC
  Administered 2019-06-10: 13:00:00 1120 mg via INTRAVENOUS
  Filled 2019-06-10: qty 56

## 2019-06-10 MED ORDER — SODIUM CHLORIDE 0.9 % IV SOLN
375.0000 mg/m2 | Freq: Once | INTRAVENOUS | Status: AC
  Administered 2019-06-10: 10:00:00 600 mg via INTRAVENOUS
  Filled 2019-06-10: qty 50

## 2019-06-10 MED ORDER — SODIUM CHLORIDE 0.9% FLUSH
10.0000 mL | INTRAVENOUS | Status: DC | PRN
  Administered 2019-06-10: 13:00:00 10 mL
  Filled 2019-06-10: qty 10

## 2019-06-10 MED ORDER — ACETAMINOPHEN 325 MG PO TABS
650.0000 mg | ORAL_TABLET | Freq: Once | ORAL | Status: AC
  Administered 2019-06-10: 09:00:00 650 mg via ORAL

## 2019-06-10 MED ORDER — SODIUM CHLORIDE 0.9 % IV SOLN
150.0000 mg | Freq: Once | INTRAVENOUS | Status: AC
  Administered 2019-06-10: 10:00:00 150 mg via INTRAVENOUS
  Filled 2019-06-10: qty 150

## 2019-06-10 MED ORDER — DIPHENHYDRAMINE HCL 25 MG PO CAPS
ORAL_CAPSULE | ORAL | Status: AC
  Filled 2019-06-10: qty 2

## 2019-06-10 MED ORDER — PALONOSETRON HCL INJECTION 0.25 MG/5ML
INTRAVENOUS | Status: AC
  Filled 2019-06-10: qty 5

## 2019-06-10 MED ORDER — SODIUM CHLORIDE 0.9 % IV SOLN
Freq: Once | INTRAVENOUS | Status: AC
  Filled 2019-06-10: qty 250

## 2019-06-10 MED ORDER — ACETAMINOPHEN 325 MG PO TABS
ORAL_TABLET | ORAL | Status: AC
  Filled 2019-06-10: qty 2

## 2019-06-10 MED ORDER — PALONOSETRON HCL INJECTION 0.25 MG/5ML
0.2500 mg | Freq: Once | INTRAVENOUS | Status: AC
  Administered 2019-06-10: 09:00:00 0.25 mg via INTRAVENOUS

## 2019-06-10 MED ORDER — DEXAMETHASONE SODIUM PHOSPHATE 10 MG/ML IJ SOLN
10.0000 mg | Freq: Once | INTRAMUSCULAR | Status: AC
  Administered 2019-06-10: 10 mg via INTRAVENOUS

## 2019-06-10 MED ORDER — HEPARIN SOD (PORK) LOCK FLUSH 100 UNIT/ML IV SOLN
500.0000 [IU] | Freq: Once | INTRAVENOUS | Status: AC | PRN
  Administered 2019-06-10: 13:00:00 500 [IU]
  Filled 2019-06-10: qty 5

## 2019-06-10 MED ORDER — DEXAMETHASONE SODIUM PHOSPHATE 10 MG/ML IJ SOLN
INTRAMUSCULAR | Status: AC
  Filled 2019-06-10: qty 1

## 2019-06-10 MED ORDER — VINCRISTINE SULFATE CHEMO INJECTION 1 MG/ML
2.0000 mg | Freq: Once | INTRAVENOUS | Status: AC
  Administered 2019-06-10: 13:00:00 2 mg via INTRAVENOUS
  Filled 2019-06-10: qty 2

## 2019-06-10 MED ORDER — DOXORUBICIN HCL CHEMO IV INJECTION 2 MG/ML
25.0000 mg/m2 | Freq: Once | INTRAVENOUS | Status: AC
  Administered 2019-06-10: 12:00:00 38 mg via INTRAVENOUS
  Filled 2019-06-10: qty 19

## 2019-06-10 MED ORDER — SODIUM CHLORIDE 0.9% FLUSH
10.0000 mL | Freq: Once | INTRAVENOUS | Status: AC
  Administered 2019-06-10: 08:00:00 10 mL via INTRAVENOUS
  Filled 2019-06-10: qty 10

## 2019-06-10 NOTE — Patient Instructions (Addendum)
[facility] Discharge Instructions for Patients Receiving Chemotherapy  Today you received the following chemotherapy agents: rituximab, doxorubicin, vincristine, and cyclophosphamide.  To help prevent nausea and vomiting after your treatment, we encourage you to take your nausea medication as directed.   If you develop nausea and vomiting that is not controlled by your nausea medication, call the clinic.   BELOW ARE SYMPTOMS THAT SHOULD BE REPORTED IMMEDIATELY:  *FEVER GREATER THAN 100.5 F  *CHILLS WITH OR WITHOUT FEVER  NAUSEA AND VOMITING THAT IS NOT CONTROLLED WITH YOUR NAUSEA MEDICATION  *UNUSUAL SHORTNESS OF BREATH  *UNUSUAL BRUISING OR BLEEDING  TENDERNESS IN MOUTH AND THROAT WITH OR WITHOUT PRESENCE OF ULCERS  *URINARY PROBLEMS  *BOWEL PROBLEMS  UNUSUAL RASH Items with * indicate a potential emergency and should be followed up as soon as possible.  Feel free to call the clinic should you have any questions or concerns. The clinic phone number is [phone].  Please show the [place] at check-in to the Emergency Department and triage nurse.

## 2019-06-10 NOTE — Progress Notes (Signed)
[place] Work  Holiday representative spoke with patient's sister, Verdene Lennert.  Verdene Lennert confirmed that patient had been accepted and was on the waiting list for the [employer].  CSW also encouraged [name] to call [facility] to explore vacancies.  CSW provided phone and e-mail contact information, and Verdene Lennert plans to call today.  Johnnye Lana, MSW, LCSW, OSW-C Clinical Social Worker [facility] [phone]

## 2019-06-10 NOTE — Progress Notes (Signed)
[place] [phone]   [phone]  OFFICE PROGRESS NOTE  Medicine, [facility] [address] 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    PRIOR THERAPY: None.  CURRENT THERAPY: Systemic chemotherapy with R-CHOP every 3 weeks.  First dose May 20, 2019.  Status post 1 cycle.  INTERVAL HISTORY: [name] 64 y.o. male returns to the clinic today for follow-up visit accompanied by his sister.  The patient is feeling fine today with no concerning complaints.  He tolerated the first cycle of his treatment fairly well with no concerning adverse effects.  He denied having any nausea, vomiting, diarrhea or constipation.  He denied having any headache or visual changes.  He has no fever or chills.  He denied having any current chest pain, shortness of breath, cough or hemoptysis.  He is here today for evaluation before starting cycle #2.   MEDICAL HISTORY: Past Medical History:  Diagnosis Date  . CAD (coronary artery disease)    a. 04/2008 Cath: LM nl, LAD 50p, 85m LCX min irregs, RCA 50p/m, 40d, RPDA 60-70ost; b. 04/2008 CABG x 4: LIMA->LAD, VG->D2, VG->RPDA->RPL.  . [place] ([facility] [place]   . Cocaine abuse ([facility]    a. Quit early 2019.  . Ischemic cardiomyopathy    a. LV gram: EF 30-35%, apical/periapical AK.  . Stroke ([facility] 02[place]  . [place] 02[place]  . Tobacco abuse     ALLERGIES:  has No Known Allergies.  MEDICATIONS:  Current Outpatient Medications  Medication Sig Dispense Refill  . acetaminophen (TYLENOL) 325 MG tablet Take 2 tablets (650 mg total) by  mouth every 4 (four) hours as needed for mild pain (or temp > 37.5 C (99.5 F)).    .Marland Kitchenallopurinol (ZYLOPRIM) 100 MG tablet Take 1 tablet (100 mg total) by mouth 2 (two) times daily. 60 tablet 2  . aspirin EC 81 MG tablet Take 1 tablet (81 mg total) by mouth daily. 30 tablet 3  . docusate sodium (COLACE) 100 MG capsule Take 1 capsule (100 mg total) by mouth every 12 (twelve) hours. 60 capsule 0  . fentaNYL (DURAGESIC) 25 MCG/HR Place 1 patch onto the skin every 3 (three) days. 5 patch 0  . lidocaine-prilocaine (EMLA) cream Apply 1 application topically as needed. Apply a teaspoon amount to port site an hour prior to chemo therapy, DO NOT rub in, cover with plastic wrap. 30 g 0  . oxyCODONE-acetaminophen (PERCOCET/ROXICET) 5-325 MG tablet Take 1 tablet by mouth every 6 (six) hours as needed for severe pain. 30 tablet 0  . polyethylene glycol (MIRALAX / GLYCOLAX) 17 g packet Take 17 g by mouth daily. 14 each 0  . predniSONE (DELTASONE) 50 MG tablet 2 tablet p.o. daily for 5 days start with the first day of every chemotherapy cycle. 80 tablet 0  . prochlorperazine (COMPAZINE) 10 MG tablet Take 1 tablet (10 mg total) by mouth every 6 (six) hours as needed for nausea or vomiting. 30 tablet 0   No current facility-administered medications for this visit.    SURGICAL HISTORY:  Past Surgical History:  Procedure Laterality Date  .  BYPASS GRAFT  2010  . CARDIAC SURGERY  2010  . IR IMAGING GUIDED PORT INSERTION  05/26/2019  . SUPRACLAVICAL NODE BIOPSY Left 05/08/2019   Procedure: SUPRACLAVICAL NODE BIOPSY;  Surgeon: [name], MD;  Location: [facility];  Service: Thoracic;  Laterality: Left;    REVIEW OF SYSTEMS:  A comprehensive review of systems was negative except for: Constitutional: positive for fatigue   PHYSICAL EXAMINATION: General appearance: alert, cooperative, fatigued and no distress Head: Normocephalic, without obvious abnormality, atraumatic Neck: no adenopathy, no JVD, supple,  symmetrical, trachea midline and thyroid not enlarged, symmetric, no tenderness/mass/nodules Lymph nodes: Cervical, supraclavicular, and axillary nodes normal. Resp: clear to auscultation bilaterally Back: symmetric, no curvature. ROM normal. No CVA tenderness. Cardio: regular rate and rhythm, S1, S2 normal, no murmur, click, rub or gallop GI: soft, non-tender; bowel sounds normal; no masses,  no organomegaly Extremities: extremities normal, atraumatic, no cyanosis or edema  ECOG PERFORMANCE STATUS: 1 - Symptomatic but completely ambulatory  Blood pressure 102/70, pulse 79, temperature 98.5 F (36.9 C), temperature source Oral, resp. rate 18, height '5\' 7"'  (1.702 m), weight 112 lb 6.4 oz (51 kg), SpO2 100 %.  LABORATORY DATA: Lab Results  Component Value Date   WBC 9.3 06/03/2019   HGB 10.7 (L) 06/03/2019   HCT 33.4 (L) 06/03/2019   MCV 91.0 06/03/2019   PLT 141 (L) 06/03/2019      Chemistry      Component Value Date/Time   NA 135 06/03/2019 1013   K 4.1 06/03/2019 1013   CL 104 06/03/2019 1013   CO2 25 06/03/2019 1013   BUN 16 06/03/2019 1013   CREATININE 0.70 06/03/2019 1013      Component Value Date/Time   CALCIUM 8.9 06/03/2019 1013   ALKPHOS 105 06/03/2019 1013   AST 9 (L) 06/03/2019 1013   ALT 12 06/03/2019 1013   BILITOT 0.2 (L) 06/03/2019 1013       RADIOGRAPHIC STUDIES: IR IMAGING GUIDED PORT INSERTION  Result Date: 05/26/2019 INDICATION: History of lymphoma. In need of durable intravenous access for chemotherapy administration. EXAM: IMPLANTED PORT A CATH PLACEMENT WITH ULTRASOUND AND FLUOROSCOPIC GUIDANCE COMPARISON:  PET-CT-04/18/2019 MEDICATIONS: Ancef 2 gm IV; The antibiotic was administered within an appropriate time interval prior to skin puncture. ANESTHESIA/SEDATION: Moderate (conscious) sedation was employed during this procedure. A total of Versed 2 mg and Fentanyl 100 mcg was administered intravenously. Moderate Sedation Time: 24 minutes. The patient's  level of consciousness and vital signs were monitored continuously by radiology nursing throughout the procedure under my direct supervision. CONTRAST:  None FLUOROSCOPY TIME:  18 seconds (2 mGy) COMPLICATIONS: None immediate. PROCEDURE: The procedure, risks, benefits, and alternatives were explained to the patient. Questions regarding the procedure were encouraged and answered. The patient understands and consents to the procedure. The right neck and chest were prepped with chlorhexidine in a sterile fashion, and a sterile drape was applied covering the operative field. Maximum barrier sterile technique with sterile gowns and gloves were used for the procedure. A timeout was performed prior to the initiation of the procedure. Local anesthesia was provided with 1% lidocaine with epinephrine. After creating a small venotomy incision, a micropuncture kit was utilized to access the internal jugular vein. Real-time ultrasound guidance was utilized for vascular access including the acquisition of a permanent ultrasound image documenting patency of the accessed vessel. The microwire was utilized to measure appropriate catheter length. A subcutaneous port pocket was then created along the upper chest wall utilizing a combination of  sharp and blunt dissection. The pocket was irrigated with sterile saline. A single lumen "Slim" sized power injectable port was chosen for placement. The 8 Fr catheter was tunneled from the port pocket site to the venotomy incision. The port was placed in the pocket. The external catheter was trimmed to appropriate length. At the venotomy, an 8 Fr peel-away sheath was placed over a guidewire under fluoroscopic guidance. The catheter was then placed through the sheath and the sheath was removed. Final catheter positioning was confirmed and documented with a fluoroscopic spot radiograph. The port was accessed with a Huber needle, aspirated and flushed with heparinized saline. The venotomy site was  closed with an interrupted 4-0 Vicryl suture. The port pocket incision was closed with interrupted 2-0 Vicryl suture and the skin was opposed with a running subcuticular 4-0 Vicryl suture. Dermabond and Steri-strips were applied to both incisions. Dressings were placed. The patient tolerated the procedure well without immediate post procedural complication. FINDINGS: After catheter placement, the tip lies within the superior cavoatrial junction. The catheter aspirates and flushes normally and is ready for immediate use. IMPRESSION: Successful placement of a right internal jugular approach power injectable Port-A-Cath. The catheter is ready for immediate use. Electronically Signed   By: Sandi Mariscal M.D.   On: 05/26/2019 12:34   ECHOCARDIOGRAM LIMITED  Result Date: 05/20/2019    ECHOCARDIOGRAM LIMITED REPORT   Patient Name:   MANTAJ CHAMBERLIN Date of Exam: 05/16/2019 Medical Rec #:  [id number]       Height:       67.0 in Accession #:    [id number]      Weight:       103.5 lb Date of Birth:  08-May-1955       BSA:          1.53 m Patient Age:    46 years        BP:           104/64 mmHg Patient Gender: M               HR:           83 bpm. Exam Location:  Outpatient Procedure: Limited Echo, Cardiac Doppler, Limited Color Doppler and Strain            Analysis Indications:    Z51.11 Encounter for antineoplastic chemotheraphy  History:        Patient has prior history of Echocardiogram examinations, most                 recent 03/06/2019. Cardiomyopathy, CAD, Stroke; Risk                 Factors:Former Smoker. Cocaine abuse.  Sonographer:    Tiffany Dance Referring Phys: [address]  1. Left ventricular ejection fraction, by estimation, is 40 to 45%. The left ventricle has mildly decreased function. The left ventricle demonstrates regional wall motion abnormalities. There is mild left ventricular hypertrophy. Left ventricular diastolic parameters are indeterminate. There is moderate akinesis of the  left ventricular, apical anteroseptal wall and apical segment. There is severe hypokinesis of the left ventricular, basal inferior wall.  2. Right ventricular systolic function is normal. The right ventricular size is normal.  3. The mitral valve is abnormal. Trivial mitral valve regurgitation. No evidence of mitral stenosis.  4. The aortic valve is normal in structure and function. Aortic valve regurgitation is not visualized. No aortic stenosis is present. FINDINGS  Left Ventricle: The LV cavity  appears to be slighly smaller compared to previous echo 03/05/19. The LV function appears to be unchanged. Left ventricular ejection fraction, by estimation, is 40 to 45%. The left ventricle has mildly decreased function. The left ventricle demonstrates regional wall motion abnormalities. Moderate akinesis of the left ventricular, apical anteroseptal wall and apical segment. Severe hypokinesis of the left ventricular, basal inferior wall. There is mild left ventricular hypertrophy. Right Ventricle: The right ventricular size is normal. No increase in right ventricular wall thickness. Right ventricular systolic function is normal. Left Atrium: Left atrial size was normal in size. Right Atrium: Right atrial size was normal in size. Pericardium: There is no evidence of pericardial effusion. Mitral Valve: The mitral valve is abnormal. There is moderate thickening of the mitral valve leaflet(s). Trivial mitral valve regurgitation. No evidence of mitral valve stenosis. Tricuspid Valve: The tricuspid valve is grossly normal. Tricuspid valve regurgitation is mild. Aortic Valve: The aortic valve is normal in structure and function. Aortic valve regurgitation is not visualized. No aortic stenosis is present. Pulmonic Valve: The pulmonic valve was normal in structure. Pulmonic valve regurgitation is not visualized. No evidence of pulmonic stenosis. Aorta: The aortic root and ascending aorta are structurally normal, with no evidence of  dilitation. IAS/Shunts: The atrial septum is grossly normal.  LEFT VENTRICLE PLAX 2D LVIDd:         3.90 cm LVIDs:         3.20 cm  2D Longitudinal Strain LV PW:         1.40 cm  2D Strain GLS Avg:     -16.8 % LV IVS:        1.00 cm LVOT diam:     2.00 cm LV SV:         50.27 ml LV SV Index:   16.93 LVOT Area:     3.14 cm  RIGHT VENTRICLE          IVC RV Basal diam:  2.70 cm  IVC diam: 1.20 cm TAPSE (M-mode): 1.7 cm LEFT ATRIUM           Index       RIGHT ATRIUM           Index LA diam:      4.20 cm 2.75 cm/m  RA Area:     12.70 cm LA Vol (A2C): 36.2 ml 23.68 ml/m RA Volume:   32.50 ml  21.26 ml/m LA Vol (A4C): 40.2 ml 26.30 ml/m  AORTIC VALVE LVOT Vmax:   79.40 cm/s LVOT Vmean:  52.100 cm/s LVOT VTI:    0.160 m  AORTA Ao Root diam: 3.80 cm Ao Asc diam:  3.50 cm MITRAL VALVE MV Area (PHT): 3.31 cm    SHUNTS MV Decel Time: 229 msec    Systemic VTI:  0.16 m MV E velocity: 57.50 cm/s  Systemic Diam: 2.00 cm MV A velocity: 66.30 cm/s MV E/A ratio:  0.87 Mertie Moores MD Electronically signed by Mertie Moores MD Signature Date/Time: 05/20/2019/10:16:04 AM    Final     ASSESSMENT AND PLAN: This is a 64 years old African-American male recently diagnosed with 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  The patient is currently undergoing systemic chemotherapy with R-CHOP status post 1 cycle.  He tolerated the first cycle of his treatment fairly well and  he has significant improvement in his general condition. I recommended for him to proceed with cycle #2 today as planned.  We will reduce the dose of doxorubicin by 50% because of his history of congestive heart failure and low ejection fraction. He will come back for follow-up visit in 3 weeks for evaluation before the next cycle of his  treatment. For pain management I gave the patient a refill of fentanyl patch and he will continue his Percocet on as-needed basis. For the constipation he was advised to continue with MiraLAX and Colace as needed. The patient and his sister requested no CODE BLUE status. The patient was advised to call immediately if he has any concerning symptoms in the interval. The patient voices understanding of current disease status and treatment options and is in agreement with the current care plan.  All questions were answered. The patient knows to call the clinic with any problems, questions or concerns. We can certainly see the patient much sooner if necessary.   Disclaimer: This note was dictated with voice recognition software. Similar sounding words can inadvertently be transcribed and may not be corrected upon review.

## 2019-06-10 NOTE — Progress Notes (Signed)
Met with patient in treatment room whom provided proof of income for [employer].  Patient approved for grant. Gave him a copy of the approval letter as well as the expense sheet along with the Outpatient pharmacy information. Patient received a gas card today. I also obtained a food bag from downstairs for him.  He has my card for any additional financial questions or concerns.

## 2019-06-12 ENCOUNTER — Other Ambulatory Visit: Payer: Self-pay | Admitting: Medical Oncology

## 2019-06-12 ENCOUNTER — Inpatient Hospital Stay: Payer: Medicaid Other

## 2019-06-12 ENCOUNTER — Other Ambulatory Visit: Payer: Self-pay

## 2019-06-12 VITALS — BP 104/70 | HR 70 | Temp 97.8°F | Resp 18

## 2019-06-12 DIAGNOSIS — Z452 Encounter for adjustment and management of vascular access device: Secondary | ICD-10-CM | POA: Diagnosis not present

## 2019-06-12 DIAGNOSIS — C8338 Diffuse large B-cell lymphoma, lymph nodes of multiple sites: Secondary | ICD-10-CM

## 2019-06-12 DIAGNOSIS — Z5111 Encounter for antineoplastic chemotherapy: Secondary | ICD-10-CM

## 2019-06-12 MED ORDER — PEGFILGRASTIM-JMDB 6 MG/0.6ML ~~LOC~~ SOSY
6.0000 mg | PREFILLED_SYRINGE | Freq: Once | SUBCUTANEOUS | Status: AC
  Administered 2019-06-12: 09:00:00 6 mg via SUBCUTANEOUS

## 2019-06-12 MED ORDER — PEGFILGRASTIM-JMDB 6 MG/0.6ML ~~LOC~~ SOSY
PREFILLED_SYRINGE | SUBCUTANEOUS | Status: AC
  Filled 2019-06-12: qty 0.6

## 2019-06-12 MED ORDER — PROCHLORPERAZINE MALEATE 10 MG PO TABS: 10.0000 mg | ORAL_TABLET | Freq: Four times a day (QID) | ORAL | 0 refills | Status: DC | PRN

## 2019-06-12 NOTE — Patient Instructions (Addendum)

## 2019-06-13 ENCOUNTER — Other Ambulatory Visit: Payer: Self-pay | Admitting: Medical Oncology

## 2019-06-13 DIAGNOSIS — Z5111 Encounter for antineoplastic chemotherapy: Secondary | ICD-10-CM

## 2019-06-13 NOTE — Telephone Encounter (Signed)
Asking for refill compazine - I told her it was sent yesterday.

## 2019-06-16 ENCOUNTER — Telehealth: Payer: Self-pay | Admitting: Medical Oncology

## 2019-06-16 ENCOUNTER — Other Ambulatory Visit: Payer: Self-pay | Admitting: Physician Assistant

## 2019-06-16 ENCOUNTER — Other Ambulatory Visit: Payer: Self-pay | Admitting: Internal Medicine

## 2019-06-16 DIAGNOSIS — C8338 Diffuse large B-cell lymphoma, lymph nodes of multiple sites: Secondary | ICD-10-CM

## 2019-06-16 MED ORDER — PREDNISONE 50 MG PO TABS: ORAL_TABLET | ORAL | 0 refills | Status: DC

## 2019-06-16 NOTE — Telephone Encounter (Signed)
Prednisone pill count-[name] said pt only has 1 prednisone left  -he should have #30. He will need a refill before next tx. She said she is going to dispense them to him from now on.

## 2019-06-17 ENCOUNTER — Inpatient Hospital Stay: Payer: Medicaid Other

## 2019-06-17 ENCOUNTER — Other Ambulatory Visit: Payer: Self-pay

## 2019-06-17 DIAGNOSIS — Z452 Encounter for adjustment and management of vascular access device: Secondary | ICD-10-CM | POA: Diagnosis not present

## 2019-06-17 DIAGNOSIS — C8338 Diffuse large B-cell lymphoma, lymph nodes of multiple sites: Secondary | ICD-10-CM

## 2019-06-17 DIAGNOSIS — Z95828 Presence of other vascular implants and grafts: Secondary | ICD-10-CM

## 2019-06-17 LAB — CBC WITH DIFFERENTIAL (CANCER CENTER ONLY)
Abs Immature Granulocytes: 1.48 10*3/uL — ABNORMAL HIGH (ref 0.00–0.07)
Basophils Absolute: 0.2 10*3/uL — ABNORMAL HIGH (ref 0.0–0.1)
Basophils Relative: 1 %
Eosinophils Absolute: 0 10*3/uL (ref 0.0–0.5)
Eosinophils Relative: 0 %
HCT: 32.7 % — ABNORMAL LOW (ref 39.0–52.0)
Hemoglobin: 10.9 g/dL — ABNORMAL LOW (ref 13.0–17.0)
Immature Granulocytes: 5 %
Lymphocytes Relative: 1 %
Lymphs Abs: 0.4 10*3/uL — ABNORMAL LOW (ref 0.7–4.0)
MCH: 30.5 pg (ref 26.0–34.0)
MCHC: 33.3 g/dL (ref 30.0–36.0)
MCV: 91.6 fL (ref 80.0–100.0)
Monocytes Absolute: 1.4 10*3/uL — ABNORMAL HIGH (ref 0.1–1.0)
Monocytes Relative: 5 %
Neutro Abs: 27.2 10*3/uL — ABNORMAL HIGH (ref 1.7–7.7)
Neutrophils Relative %: 88 %
Platelet Count: 392 10*3/uL (ref 150–400)
RBC: 3.57 MIL/uL — ABNORMAL LOW (ref 4.22–5.81)
RDW: 19.5 % — ABNORMAL HIGH (ref 11.5–15.5)
WBC Count: 30.6 10*3/uL — ABNORMAL HIGH (ref 4.0–10.5)
nRBC: 0.1 % (ref 0.0–0.2)

## 2019-06-17 LAB — CMP (CANCER CENTER ONLY)
ALT: 15 U/L (ref 0–44)
AST: 8 U/L — ABNORMAL LOW (ref 15–41)
Albumin: 3.4 g/dL — ABNORMAL LOW (ref 3.5–5.0)
Alkaline Phosphatase: 156 U/L — ABNORMAL HIGH (ref 38–126)
Anion gap: 8 (ref 5–15)
BUN: 17 mg/dL (ref 8–23)
CO2: 26 mmol/L (ref 22–32)
Calcium: 9 mg/dL (ref 8.9–10.3)
Chloride: 103 mmol/L (ref 98–111)
Creatinine: 0.68 mg/dL (ref 0.61–1.24)
GFR, Est AFR Am: >60 mL/min (ref >60)
GFR, Estimated: >60 mL/min (ref >60)
Glucose, Bld: 100 mg/dL — ABNORMAL HIGH (ref 70–99)
Potassium: 4.1 mmol/L (ref 3.5–5.1)
Sodium: 137 mmol/L (ref 135–145)
Total Bilirubin: 0.3 mg/dL (ref 0.3–1.2)
Total Protein: 6.6 g/dL (ref 6.5–8.1)

## 2019-06-17 MED ORDER — SODIUM CHLORIDE 0.9% FLUSH
10.0000 mL | INTRAVENOUS | Status: DC | PRN
  Administered 2019-06-17: 10 mL via INTRAVENOUS
  Filled 2019-06-17: qty 10

## 2019-06-17 MED ORDER — HEPARIN SOD (PORK) LOCK FLUSH 100 UNIT/ML IV SOLN
500.0000 [IU] | Freq: Once | INTRAVENOUS | Status: AC
  Administered 2019-06-17: 500 [IU] via INTRAVENOUS
  Filled 2019-06-17: qty 5

## 2019-06-19 ENCOUNTER — Telehealth: Payer: Self-pay | Admitting: General Practice

## 2019-06-19 NOTE — Telephone Encounter (Signed)
[place] CSW Progress Notes  Call to niece V Whipp to check on progress towards housing stability.  Reports he is on project based Section 8 wiating list w [employer].  Wait lists on all options they have investigated.  No communication from [employer] re his status.  He remains at hotel - prefers this to boarding house as hotel room is private and clean.  In boarding house he would share facilities w others and he would be concerned about sanitation and noise. Has lived in boarding houses before, may be able to return if he needs to economize.  Family reports he is mostly staying in after chemo treatments, has continued to fall.  Spoke w Aron Baba, [place] staff member assigned to generate options for unsheltered individuals w medical needs.  She is aware of this man's situation and will continue to press for him to obtain resources needed to gain housing stability.  Call also placed to [employer] for update on his situation.  Edwyna Shell, LCSW Clinical Social Worker Phone:  [phone] Cell:  [phone]

## 2019-06-24 ENCOUNTER — Other Ambulatory Visit: Payer: Self-pay

## 2019-06-24 ENCOUNTER — Inpatient Hospital Stay: Payer: Medicaid Other

## 2019-06-24 DIAGNOSIS — Z95828 Presence of other vascular implants and grafts: Secondary | ICD-10-CM

## 2019-06-24 DIAGNOSIS — Z452 Encounter for adjustment and management of vascular access device: Secondary | ICD-10-CM | POA: Diagnosis not present

## 2019-06-24 DIAGNOSIS — C8338 Diffuse large B-cell lymphoma, lymph nodes of multiple sites: Secondary | ICD-10-CM

## 2019-06-24 LAB — CMP (CANCER CENTER ONLY)
ALT: 22 U/L (ref 0–44)
AST: 14 U/L — ABNORMAL LOW (ref 15–41)
Albumin: 3.3 g/dL — ABNORMAL LOW (ref 3.5–5.0)
Alkaline Phosphatase: 101 U/L (ref 38–126)
Anion gap: 8 (ref 5–15)
BUN: 15 mg/dL (ref 8–23)
CO2: 24 mmol/L (ref 22–32)
Calcium: 9 mg/dL (ref 8.9–10.3)
Chloride: 106 mmol/L (ref 98–111)
Creatinine: 0.75 mg/dL (ref 0.61–1.24)
GFR, Est AFR Am: >60 mL/min (ref >60)
GFR, Estimated: >60 mL/min (ref >60)
Glucose, Bld: 87 mg/dL (ref 70–99)
Potassium: 3.9 mmol/L (ref 3.5–5.1)
Sodium: 138 mmol/L (ref 135–145)
Total Bilirubin: 0.3 mg/dL (ref 0.3–1.2)
Total Protein: 7 g/dL (ref 6.5–8.1)

## 2019-06-24 LAB — CBC WITH DIFFERENTIAL (CANCER CENTER ONLY)
Abs Immature Granulocytes: 0.29 10*3/uL — ABNORMAL HIGH (ref 0.00–0.07)
Basophils Absolute: 0.1 10*3/uL (ref 0.0–0.1)
Basophils Relative: 1 %
Eosinophils Absolute: 0 10*3/uL (ref 0.0–0.5)
Eosinophils Relative: 1 %
HCT: 36 % — ABNORMAL LOW (ref 39.0–52.0)
Hemoglobin: 11.4 g/dL — ABNORMAL LOW (ref 13.0–17.0)
Immature Granulocytes: 5 %
Lymphocytes Relative: 10 %
Lymphs Abs: 0.6 10*3/uL — ABNORMAL LOW (ref 0.7–4.0)
MCH: 30.2 pg (ref 26.0–34.0)
MCHC: 31.7 g/dL (ref 30.0–36.0)
MCV: 95.2 fL (ref 80.0–100.0)
Monocytes Absolute: 0.7 10*3/uL (ref 0.1–1.0)
Monocytes Relative: 12 %
Neutro Abs: 4.5 10*3/uL (ref 1.7–7.7)
Neutrophils Relative %: 71 %
Platelet Count: 217 10*3/uL (ref 150–400)
RBC: 3.78 MIL/uL — ABNORMAL LOW (ref 4.22–5.81)
RDW: 20.8 % — ABNORMAL HIGH (ref 11.5–15.5)
WBC Count: 6.2 10*3/uL (ref 4.0–10.5)
nRBC: 0 % (ref 0.0–0.2)

## 2019-06-24 MED ORDER — HEPARIN SOD (PORK) LOCK FLUSH 100 UNIT/ML IV SOLN
500.0000 [IU] | Freq: Once | INTRAVENOUS | Status: AC
  Administered 2019-06-24: 500 [IU] via INTRAVENOUS
  Filled 2019-06-24: qty 5

## 2019-06-24 MED ORDER — SODIUM CHLORIDE 0.9% FLUSH
10.0000 mL | INTRAVENOUS | Status: DC | PRN
  Administered 2019-06-24: 10 mL via INTRAVENOUS
  Filled 2019-06-24: qty 10

## 2019-06-25 ENCOUNTER — Encounter: Payer: Self-pay | Admitting: *Deleted

## 2019-06-25 NOTE — Progress Notes (Signed)
Pharmacist Chemotherapy Monitoring - Follow Up Assessment    I verify that I have reviewed each item in the below checklist:  . Regimen for the patient is scheduled for the appropriate day and plan matches scheduled date. Marland Kitchen Appropriate non-routine labs are ordered dependent on drug ordered. . If applicable, additional medications reviewed and ordered per protocol based on lifetime cumulative doses and/or treatment regimen.   Plan for follow-up and/or issues identified: No . I-vent associated with next due treatment: No . MD and/or nursing notified: No  Chassity Ludke K 06/25/2019 9:04 AM

## 2019-06-25 NOTE — Progress Notes (Signed)
[place] Work  Clinical Social Work spoke with [name] at [employer].  Ms. [name] stated patients application was missing a mailing address.  Ms. [name] requested patients mailing address be emailed to her at [email or web address].  CSW contacted patients niece [name] and shared information above.  [name] plans to e-mail Ms. [name] today with requested information.   Johnnye Lana, MSW, LCSW, OSW-C Clinical Social Worker [facility] [phone]

## 2019-06-30 NOTE — Progress Notes (Signed)
[name] OFFICE PROGRESS NOTE  Medicine, [facility] [address] 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  PRIOR THERAPY:  None  CURRENT THERAPY: Systemic chemotherapy with R-CHOP every 3 weeks. First dose May 20, 2019. Status post 2 cycles.   INTERVAL HISTORY: [name] 64 y.o. male returns to the clinic today for a follow-up visit accompanied by his sister.  The patient is feeling fairly well today without any concerning complaints.  He is tolerating his his chemotherapy well without any adverse side effects. His appetite is getting better and he gained 4 lbs since his last appointment. His sister brings him food daily.  He denies any recent fever, chills, or night sweats.  He denies any cough or shortness of breath.  He denies any chest pain or hemoptysis.  His LUQ pain due to splenomegaly has improved. He has fentanyl patches and oxycodone for this.   He states he takes 1 tablet of oxycodone daily.  The last time he had pain was after his Neulasta injection.  He has been out of his oxycodone and fentanyl patches for several days without any significant pain.  He denies any nausea, vomiting, diarrhea, or constipation. He denies any headache or visual changes.  He denies any peripheral neuropathy.  He is here today for evaluation before starting cycle #3.   MEDICAL HISTORY: Past Medical History:  Diagnosis Date  . CAD (coronary artery disease)    a. 04/2008 Cath: LM nl, LAD 50p, 31m, LCX min irregs, RCA 50p/m, 40d, RPDA 60-70ost; b. 04/2008 CABG x 4: LIMA->LAD, VG->D2,  VG->RPDA->RPL.  . Cancer ([place])   . Cocaine abuse ([place])    a. Quit early 2019.  . Ischemic cardiomyopathy    a. LV gram: EF 30-35%, apical/periapical AK.  . Stroke ([place]) 02/2019  . Stroke ([place]) 02/2019  . Tobacco abuse     ALLERGIES:  has No Known Allergies.  MEDICATIONS:  Current Outpatient Medications  Medication Sig Dispense Refill  . allopurinol (ZYLOPRIM) 100 MG tablet Take 1 tablet (100 mg total) by mouth 2 (two) times daily. 60 tablet 2  . aspirin EC 81 MG tablet Take 1 tablet (81 mg total) by mouth daily. 30 tablet 3  . lidocaine-prilocaine (EMLA) cream Apply 1 application topically as needed. Apply a teaspoon amount to port site an hour prior to chemo therapy, DO NOT rub in, cover with plastic wrap. 30 g 0  . oxyCODONE-acetaminophen (PERCOCET/ROXICET) 5-325 MG tablet Take 1 tablet by mouth every 6 (six) hours as needed for severe pain. 30 tablet 0  . polyethylene glycol (MIRALAX / GLYCOLAX) 17 g packet Take 17 g by mouth daily. 14 each 0  . predniSONE (DELTASONE) 50 MG tablet 2 tablet p.o. daily for 5 days start with the first day of every chemotherapy cycle. 80 tablet 0  . prochlorperazine (COMPAZINE) 10 MG tablet Take 1 tablet (10 mg total) by mouth every 6 (six) hours as needed for nausea or vomiting. 30 tablet 2  . acetaminophen (TYLENOL) 325 MG tablet Take 2 tablets (650 mg total) by mouth every 4 (four) hours as needed for mild pain (or temp > 37.5  C (99.5 F)). (Patient not taking: Reported on 06/10/2019)    . docusate sodium (COLACE) 100 MG capsule Take 1 capsule (100 mg total) by mouth every 12 (twelve) hours. (Patient not taking: Reported on 07/01/2019) 60 capsule 0   No current facility-administered medications for this visit.    SURGICAL HISTORY:  Past Surgical History:  Procedure Laterality Date  . BYPASS GRAFT  2010  . CARDIAC SURGERY  2010  . IR IMAGING GUIDED PORT INSERTION  05/26/2019  . SUPRACLAVICAL NODE BIOPSY Left 05/08/2019   Procedure: SUPRACLAVICAL NODE  BIOPSY;  Surgeon: [name], MD;  Location: [facility] OR;  Service: Thoracic;  Laterality: Left;    REVIEW OF SYSTEMS:   Review of Systems  Constitutional: Negative for appetite change, chills, fatigue, fever and unexpected weight change.  HENT:   Negative for mouth sores, nosebleeds, sore throat and trouble swallowing.   Eyes: Negative for eye problems and icterus.  Respiratory: Negative for cough, hemoptysis, shortness of breath and wheezing.  Cardiovascular: Negative for chest pain and leg swelling.  Gastrointestinal: Negative for abdominal pain, constipation, diarrhea, nausea and vomiting.  Genitourinary: Negative for bladder incontinence, difficulty urinating, dysuria, frequency and hematuria.   Musculoskeletal: Negative for back pain, gait problem, neck pain and neck stiffness.  Skin: Negative for itching and rash.  Neurological: Negative for dizziness, extremity weakness, gait problem, headaches, light-headedness and seizures.  Hematological: Negative for adenopathy. Does not bruise/bleed easily.  Psychiatric/Behavioral: Negative for confusion, depression and sleep disturbance. The patient is not nervous/anxious.     PHYSICAL EXAMINATION:  Blood pressure 100/66, pulse 66, temperature 98.8 F (37.1 C), temperature source Temporal, resp. rate 20, height 5\' 7"  (1.702 m), weight 116 lb 6.4 oz (52.8 kg), SpO2 99 %.  ECOG PERFORMANCE STATUS: 1 - Symptomatic but completely ambulatory  Physical Exam  Constitutional: Oriented to person, place, and time and cachectic appearing male and in no acute distress  HENT:  Head: Normocephalic and atraumatic.  Mouth/Throat: Oropharynx is clear and moist. No oropharyngeal exudate.  Eyes: Conjunctivae are normal. Right eye exhibits no discharge. Left eye exhibits no discharge. No scleral icterus.  Neck: Normal range of motion. Neck supple.  Cardiovascular: Normal rate, regular rhythm, normal heart sounds and intact distal pulses.    Pulmonary/Chest: Effort normal and breath sounds normal. No respiratory distress. No wheezes. No rales.  Abdominal: Soft. Bowel sounds are normal. Exhibits no distension and no mass. There is no tenderness.  Musculoskeletal: Normal range of motion. Exhibits no edema.  Lymphadenopathy:    No cervical adenopathy.  Neurological: Alert and oriented to person, place, and time. Exhibits normal muscle tone. Gait normal. Coordination normal.  Skin: Skin is warm and dry. No rash noted. Not diaphoretic. No erythema. No pallor.  Psychiatric: Mood, memory and judgment normal.  Vitals reviewed.  LABORATORY DATA: Lab Results  Component Value Date   WBC 4.7 07/01/2019   HGB 11.9 (L) 07/01/2019   HCT 36.2 (L) 07/01/2019   MCV 94.3 07/01/2019   PLT 246 07/01/2019      Chemistry      Component Value Date/Time   NA 142 07/01/2019 0851   K 3.5 07/01/2019 0851   CL 108 07/01/2019 0851   CO2 22 07/01/2019 0851   BUN 14 07/01/2019 0851   CREATININE 0.71 07/01/2019 0851      Component Value Date/Time   CALCIUM 9.1 07/01/2019 0851   ALKPHOS 85 07/01/2019 0851   AST 14 (L) 07/01/2019 0851   ALT 21 07/01/2019 0851  BILITOT 0.3 07/01/2019 0851       RADIOGRAPHIC STUDIES:  No results found.   ASSESSMENT/PLAN:  This is a very pleasant 64 year old African-American male recently diagnosed with stage IV large B cell non-Hodgkin's lymphoma.  He was diagnosed in February 2021.  He presented with extensive adenopathy in the abdomen and pelvis with a bulky right perihilar mass and left supraclavicular lymphadenopathy and spleen enlargement.  He was diagnosed in February 2021.  The patient is currently undergoing systemic chemotherapy with R-CHOP with Neulasta support IV every 3 weeks. He is on a reduced dose of doxorubicin due to his history of congestive heart failure (last EF 40-45%). He is status post 2 cycles.     Labs were reviewed.  Recommend the patient continue on treatment at the same  dose.   He will receive cycle #3 today in scheduled.   I will arrange for a restaging CT of his chest abdomen, and pelvis prior to his next cycle of treatment.   We will see the patient back for follow-up visit in 2 weeks for evaluation and repeat blood work before proceeding with cycle #2.  His pain has improved from prior. Discussed discontinuing his fentanyl patches. I will refill his oxycodone with the intention of having his weaned off. Discussed only to take this medication Q6 hours as needed. Discussed not to take it if he is not having any pain.   He will continue to follow with social work.   They requested several refills today. It appears he has recently been sent prednisone to his pharmacy 2 weeks ago. Advised them to reach out to their pharmacy to pick up the refills. I have sent a prescription for compazine, allopurinol, EMLA cream, and oxycodone.   The patient will continue to follow with cardiology who are planning on obtaining follow up echocardiograms due to his history of CHF and being on doxorubicin which can be cardiotoxic. He is on a reduced dose of doxorubicin.    The patient was advised to call immediately if he has any concerning symptoms in the interval. The patient voices understanding of current disease status and treatment options and is in agreement with the current care plan. All questions were answered. The patient knows to call the clinic with any problems, questions or concerns. We can certainly see the patient much sooner if necessary  No orders of the defined types were placed in this encounter.    Penne Rosenstock L Reece Fehnel, PA-C 07/01/19

## 2019-07-01 ENCOUNTER — Inpatient Hospital Stay: Payer: Medicaid Other

## 2019-07-01 ENCOUNTER — Inpatient Hospital Stay: Payer: Medicaid Other | Attending: Internal Medicine

## 2019-07-01 ENCOUNTER — Other Ambulatory Visit: Payer: Self-pay | Admitting: Internal Medicine

## 2019-07-01 ENCOUNTER — Other Ambulatory Visit: Payer: Self-pay

## 2019-07-01 ENCOUNTER — Inpatient Hospital Stay (HOSPITAL_BASED_OUTPATIENT_CLINIC_OR_DEPARTMENT_OTHER): Payer: Medicaid Other | Admitting: Physician Assistant

## 2019-07-01 ENCOUNTER — Encounter: Payer: Self-pay | Admitting: Physician Assistant

## 2019-07-01 VITALS — BP 100/66 | HR 66 | Temp 98.8°F | Resp 20 | Ht 67.0 in | Wt 116.4 lb

## 2019-07-01 VITALS — BP 93/64 | HR 73 | Temp 97.8°F | Resp 16

## 2019-07-01 DIAGNOSIS — Z5189 Encounter for other specified aftercare: Secondary | ICD-10-CM | POA: Diagnosis not present

## 2019-07-01 DIAGNOSIS — I509 Heart failure, unspecified: Secondary | ICD-10-CM | POA: Insufficient documentation

## 2019-07-01 DIAGNOSIS — Z5112 Encounter for antineoplastic immunotherapy: Secondary | ICD-10-CM | POA: Diagnosis present

## 2019-07-01 DIAGNOSIS — C8338 Diffuse large B-cell lymphoma, lymph nodes of multiple sites: Secondary | ICD-10-CM

## 2019-07-01 DIAGNOSIS — C8330 Diffuse large B-cell lymphoma, unspecified site: Secondary | ICD-10-CM | POA: Insufficient documentation

## 2019-07-01 DIAGNOSIS — R918 Other nonspecific abnormal finding of lung field: Secondary | ICD-10-CM

## 2019-07-01 DIAGNOSIS — G893 Neoplasm related pain (acute) (chronic): Secondary | ICD-10-CM | POA: Diagnosis not present

## 2019-07-01 DIAGNOSIS — R161 Splenomegaly, not elsewhere classified: Secondary | ICD-10-CM | POA: Insufficient documentation

## 2019-07-01 DIAGNOSIS — Z5111 Encounter for antineoplastic chemotherapy: Secondary | ICD-10-CM | POA: Insufficient documentation

## 2019-07-01 DIAGNOSIS — Z95828 Presence of other vascular implants and grafts: Secondary | ICD-10-CM | POA: Diagnosis not present

## 2019-07-01 DIAGNOSIS — Z79891 Long term (current) use of opiate analgesic: Secondary | ICD-10-CM | POA: Diagnosis not present

## 2019-07-01 LAB — CBC WITH DIFFERENTIAL (CANCER CENTER ONLY)
Abs Immature Granulocytes: 0.03 10*3/uL (ref 0.00–0.07)
Basophils Absolute: 0.1 10*3/uL (ref 0.0–0.1)
Basophils Relative: 2 %
Eosinophils Absolute: 0.1 10*3/uL (ref 0.0–0.5)
Eosinophils Relative: 1 %
HCT: 36.2 % — ABNORMAL LOW (ref 39.0–52.0)
Hemoglobin: 11.9 g/dL — ABNORMAL LOW (ref 13.0–17.0)
Immature Granulocytes: 1 %
Lymphocytes Relative: 13 %
Lymphs Abs: 0.6 10*3/uL — ABNORMAL LOW (ref 0.7–4.0)
MCH: 31 pg (ref 26.0–34.0)
MCHC: 32.9 g/dL (ref 30.0–36.0)
MCV: 94.3 fL (ref 80.0–100.0)
Monocytes Absolute: 0.8 10*3/uL (ref 0.1–1.0)
Monocytes Relative: 18 %
Neutro Abs: 3.1 10*3/uL (ref 1.7–7.7)
Neutrophils Relative %: 65 %
Platelet Count: 246 10*3/uL (ref 150–400)
RBC: 3.84 MIL/uL — ABNORMAL LOW (ref 4.22–5.81)
RDW: 19.8 % — ABNORMAL HIGH (ref 11.5–15.5)
WBC Count: 4.7 10*3/uL (ref 4.0–10.5)
nRBC: 0 % (ref 0.0–0.2)

## 2019-07-01 LAB — CMP (CANCER CENTER ONLY)
ALT: 21 U/L (ref 0–44)
AST: 14 U/L — ABNORMAL LOW (ref 15–41)
Albumin: 3.4 g/dL — ABNORMAL LOW (ref 3.5–5.0)
Alkaline Phosphatase: 85 U/L (ref 38–126)
Anion gap: 12 (ref 5–15)
BUN: 14 mg/dL (ref 8–23)
CO2: 22 mmol/L (ref 22–32)
Calcium: 9.1 mg/dL (ref 8.9–10.3)
Chloride: 108 mmol/L (ref 98–111)
Creatinine: 0.71 mg/dL (ref 0.61–1.24)
GFR, Est AFR Am: >60 mL/min (ref >60)
GFR, Estimated: >60 mL/min (ref >60)
Glucose, Bld: 70 mg/dL (ref 70–99)
Potassium: 3.5 mmol/L (ref 3.5–5.1)
Sodium: 142 mmol/L (ref 135–145)
Total Bilirubin: 0.3 mg/dL (ref 0.3–1.2)
Total Protein: 6.8 g/dL (ref 6.5–8.1)

## 2019-07-01 LAB — URIC ACID: Uric Acid, Serum: 3.8 mg/dL (ref 3.7–8.6)

## 2019-07-01 MED ORDER — PROCHLORPERAZINE MALEATE 10 MG PO TABS: 10.0000 mg | ORAL_TABLET | Freq: Four times a day (QID) | ORAL | 2 refills | Status: DC | PRN

## 2019-07-01 MED ORDER — OXYCODONE-ACETAMINOPHEN 5-325 MG PO TABS: 1.0000 | ORAL_TABLET | Freq: Four times a day (QID) | ORAL | 0 refills | Status: DC | PRN

## 2019-07-01 MED ORDER — SODIUM CHLORIDE 0.9 % IV SOLN
10.0000 mg | Freq: Once | INTRAVENOUS | Status: AC
  Administered 2019-07-01: 11:00:00 10 mg via INTRAVENOUS
  Filled 2019-07-01: qty 1

## 2019-07-01 MED ORDER — HEPARIN SOD (PORK) LOCK FLUSH 100 UNIT/ML IV SOLN
500.0000 [IU] | Freq: Once | INTRAVENOUS | Status: AC | PRN
  Administered 2019-07-01: 500 [IU]
  Filled 2019-07-01: qty 5

## 2019-07-01 MED ORDER — PALONOSETRON HCL INJECTION 0.25 MG/5ML
0.2500 mg | Freq: Once | INTRAVENOUS | Status: AC
  Administered 2019-07-01: 11:00:00 0.25 mg via INTRAVENOUS

## 2019-07-01 MED ORDER — DIPHENHYDRAMINE HCL 25 MG PO CAPS
50.0000 mg | ORAL_CAPSULE | Freq: Once | ORAL | Status: AC
  Administered 2019-07-01: 50 mg via ORAL

## 2019-07-01 MED ORDER — ALLOPURINOL 100 MG PO TABS: 100.0000 mg | ORAL_TABLET | Freq: Two times a day (BID) | ORAL | 2 refills | Status: DC

## 2019-07-01 MED ORDER — SODIUM CHLORIDE 0.9 % IV SOLN
Freq: Once | INTRAVENOUS | Status: AC
  Filled 2019-07-01: qty 250

## 2019-07-01 MED ORDER — SODIUM CHLORIDE 0.9% FLUSH
10.0000 mL | INTRAVENOUS | Status: DC | PRN
  Administered 2019-07-01: 09:00:00 10 mL via INTRAVENOUS
  Filled 2019-07-01: qty 10

## 2019-07-01 MED ORDER — PALONOSETRON HCL INJECTION 0.25 MG/5ML
INTRAVENOUS | Status: AC
  Filled 2019-07-01: qty 5

## 2019-07-01 MED ORDER — SODIUM CHLORIDE 0.9 % IV SOLN
150.0000 mg | Freq: Once | INTRAVENOUS | Status: AC
  Administered 2019-07-01: 150 mg via INTRAVENOUS
  Filled 2019-07-01: qty 5

## 2019-07-01 MED ORDER — SODIUM CHLORIDE 0.9 % IV SOLN
375.0000 mg/m2 | Freq: Once | INTRAVENOUS | Status: AC
  Administered 2019-07-01: 14:00:00 600 mg via INTRAVENOUS
  Filled 2019-07-01: qty 10

## 2019-07-01 MED ORDER — SODIUM CHLORIDE 0.9% FLUSH
10.0000 mL | INTRAVENOUS | Status: DC | PRN
  Administered 2019-07-01: 10 mL
  Filled 2019-07-01: qty 10

## 2019-07-01 MED ORDER — DOXORUBICIN HCL CHEMO IV INJECTION 2 MG/ML
25.0000 mg/m2 | Freq: Once | INTRAVENOUS | Status: AC
  Administered 2019-07-01: 38 mg via INTRAVENOUS
  Filled 2019-07-01: qty 19

## 2019-07-01 MED ORDER — HEPARIN SOD (PORK) LOCK FLUSH 100 UNIT/ML IV SOLN
500.0000 [IU] | Freq: Once | INTRAVENOUS | Status: DC
  Filled 2019-07-01: qty 5

## 2019-07-01 MED ORDER — SODIUM CHLORIDE 0.9 % IV SOLN
750.0000 mg/m2 | Freq: Once | INTRAVENOUS | Status: AC
  Administered 2019-07-01: 1120 mg via INTRAVENOUS
  Filled 2019-07-01: qty 56

## 2019-07-01 MED ORDER — DIPHENHYDRAMINE HCL 25 MG PO CAPS
ORAL_CAPSULE | ORAL | Status: AC
  Filled 2019-07-01: qty 2

## 2019-07-01 MED ORDER — ACETAMINOPHEN 325 MG PO TABS
650.0000 mg | ORAL_TABLET | Freq: Once | ORAL | Status: AC
  Administered 2019-07-01: 650 mg via ORAL

## 2019-07-01 MED ORDER — LIDOCAINE-PRILOCAINE 2.5-2.5 % EX CREA: 1.0000 "application " | TOPICAL_CREAM | CUTANEOUS | 0 refills | Status: DC | PRN

## 2019-07-01 MED ORDER — VINCRISTINE SULFATE CHEMO INJECTION 1 MG/ML
2.0000 mg | Freq: Once | INTRAVENOUS | Status: AC
  Administered 2019-07-01: 2 mg via INTRAVENOUS
  Filled 2019-07-01: qty 2

## 2019-07-01 MED ORDER — ACETAMINOPHEN 325 MG PO TABS
ORAL_TABLET | ORAL | Status: AC
  Filled 2019-07-01: qty 2

## 2019-07-01 NOTE — Patient Instructions (Addendum)
[facility] Discharge Instructions for Patients Receiving Chemotherapy  Today you received the following chemotherapy agents: rituximab, doxorubicin, vincristine, and cyclophosphamide.  To help prevent nausea and vomiting after your treatment, we encourage you to take your nausea medication as directed.   If you develop nausea and vomiting that is not controlled by your nausea medication, call the clinic.   BELOW ARE SYMPTOMS THAT SHOULD BE REPORTED IMMEDIATELY:  *FEVER GREATER THAN 100.5 F  *CHILLS WITH OR WITHOUT FEVER  NAUSEA AND VOMITING THAT IS NOT CONTROLLED WITH YOUR NAUSEA MEDICATION  *UNUSUAL SHORTNESS OF BREATH  *UNUSUAL BRUISING OR BLEEDING  TENDERNESS IN MOUTH AND THROAT WITH OR WITHOUT PRESENCE OF ULCERS  *URINARY PROBLEMS  *BOWEL PROBLEMS  UNUSUAL RASH Items with * indicate a potential emergency and should be followed up as soon as possible.  Feel free to call the clinic should you have any questions or concerns. The clinic phone number is [phone].  Please show the [facility] at check-in to the Emergency Department and triage nurse.   Non-Hodgkin Lymphoma, Adult Non-Hodgkin lymphoma (NHL) is a cancer of the lymphatic system. The lymphatic system is part of the body's defense (immune) system. It protects the body from:  Infections.  Germs.  Diseases. NHL affects a type of white blood cell called lymphocytes. There are many different types of NHL. NHL can occur in the B lymphocytes (B cells), T lymphocytes (T cells), and natural killer (NK) cells. The kind of NHL you have depends on the type of cells it affects and how quickly it grows and spreads. What are the causes? The cause of this condition is not known. What increases the risk? You are more likely to develop this condition if:  You have a weak immune system, especially after receiving an organ from a donor (transplant).  You have an autoimmune disease like rheumatoid  arthritis.  You have infections from bacteria or viruses. These include: ? Human immunodeficiency virus (HIV). ? Epstein-Barr virus.  You are over the age of 14.  You are male.  You are Caucasian.  You have been treated with high-energy beams that destroy cancer cells(radiation therapy) for other cancers. What are the signs or symptoms? Symptoms of this condition depend on the type, where it is in the body, and whether it is fast-growing or slow-growing. Some people may not have any symptoms. Common symptoms include:  Swelling of the collections of tissue that filter bacteria, viruses, and waste from the bloodstream (lymph nodes) in your neck, armpits, or groin.  Fever.  Unexplained weight loss.  Night sweats.  Tiredness (fatigue). How is this diagnosed? This condition may be diagnosed based on:  A physical exam and medical history.  Chest X-ray.  Testing of: ? Tissue from your lymph gland or bone marrow (biopsy). ? Spinal fluid (lumbar puncture or spinal tap). ? Abdominal or chest fluid. ? Blood. ? Urine.  Imaging tests, such as: ? CT scan. ? PET scan (positron emission tomography). ? MRI. Your health care provider will do more tests to determine whether the cancer has spread, where it has spread to, and how severe it is. This is called staging. You may need to have other tests to look for certain proteins or gene mutations that may help with treatment planning. How is this treated? Treatment for this condition depends on your symptoms, the type and stage of NHL, and how fast it is spreading. Treatment may include:  Radiation therapy.  Chemotherapy. This uses medicine to  destroy cancer cells.  Biological therapy. This helps to control the spread of cancer by boosting the body's immune system.  Targeted medicines to treat specific proteins or gene mutations that are present in your lymphoma.  Bone marrow or stem cell transplantation. In this procedure, you  receive high doses of chemotherapy followed by a healthy bone marrow or stem cell transplant from a donor.  Participating in clinical trials to see if new (experimental) treatments are effective.  Surgery to remove the lymphoma.  Blood or platelets from a donor (transfusion). This may be done if your blood counts are low. Follow these instructions at home: Medicines  Take over-the-counter and prescription medicines only as told by your health care provider.  Do not take dietary supplements or herbal medicines unless your health care provider tells you to take them. Some supplements can interfere with how well your treatment works. Lifestyle  Get enough sleep. Most adults need 6-8 hours of sleep each night. During treatment, you may need more sleep.  Consider joining a cancer support group. Ask your health care provider for more information about local and online support groups. General instructions   Drink enough fluid to keep your urine pale yellow.  Wash your hands often with soap and water. If soap and water are not available, use hand sanitizer.  Tell your cancer care team if you develop side effects from treatment. They may be able to recommend ways to manage them.  Keep all follow-up visits as told by your health care provider. This is important. Where to find more information  American Cancer Society: www.cancer.org  Leukemia and Lymphoma Society: PreviewPal.pl  National Cancer Institute ([place]): www.cancer.gov Contact a health care provider if you:  Have new lumps under your arms, on your neck, or near your groin.  Have painful lymph nodes.  Have nausea or vomiting.  Have constipation or diarrhea.  Have trouble urinating or painful urination.  Have a skin rash.  Have abdominal pain.  Have joint pain.  Feel dizzy or light-headed.  Have a fever or chills. Get help right away if you:  Have trouble breathing or chest pain.  Have blood in your urine or  stool. Summary  Non-Hodgkin lymphoma (NHL) is a cancer of the lymphatic system. The lymphatic system is part of your body's defense (immune) system.  Treatment for this condition depends on your symptoms, the type and stage of NHL, and how fast it is spreading.  Tell your cancer care team if you develop side effects from treatment. They may be able to recommend ways to manage them.  Contact your health care provider if you have new lumps under your arms, on your neck, or near your groin. This information is not intended to replace advice given to you by your health care provider. Make sure you discuss any questions you have with your health care provider. Document Revised: 05/30/2018 Document Reviewed: 05/30/2018 Elsevier Patient Education  [place].

## 2019-07-02 ENCOUNTER — Other Ambulatory Visit: Payer: Self-pay | Admitting: Physician Assistant

## 2019-07-02 DIAGNOSIS — C8338 Diffuse large B-cell lymphoma, lymph nodes of multiple sites: Secondary | ICD-10-CM

## 2019-07-03 ENCOUNTER — Telehealth: Payer: Self-pay | Admitting: Physician Assistant

## 2019-07-03 ENCOUNTER — Inpatient Hospital Stay: Payer: Medicaid Other

## 2019-07-03 ENCOUNTER — Telehealth: Payer: Self-pay | Admitting: Medical Oncology

## 2019-07-03 ENCOUNTER — Other Ambulatory Visit: Payer: Self-pay

## 2019-07-03 VITALS — BP 102/66

## 2019-07-03 DIAGNOSIS — Z5112 Encounter for antineoplastic immunotherapy: Secondary | ICD-10-CM | POA: Diagnosis not present

## 2019-07-03 MED ORDER — PEGFILGRASTIM-JMDB 6 MG/0.6ML ~~LOC~~ SOSY
6.0000 mg | PREFILLED_SYRINGE | Freq: Once | SUBCUTANEOUS | Status: AC
  Administered 2019-07-03: 6 mg via SUBCUTANEOUS

## 2019-07-03 MED ORDER — PEGFILGRASTIM-JMDB 6 MG/0.6ML ~~LOC~~ SOSY
PREFILLED_SYRINGE | SUBCUTANEOUS | Status: AC
  Filled 2019-07-03: qty 0.6

## 2019-07-03 NOTE — Telephone Encounter (Signed)
Pt is coming today for injection.He is calling a cab. I sent transportation message to get pt enrolled for future transportation.

## 2019-07-03 NOTE — Patient Instructions (Signed)

## 2019-07-03 NOTE — Telephone Encounter (Signed)
Scheduled per los. Called and left msg. Mailed printout  °

## 2019-07-08 ENCOUNTER — Ambulatory Visit: Payer: Medicaid Other

## 2019-07-08 ENCOUNTER — Other Ambulatory Visit: Payer: Self-pay

## 2019-07-08 ENCOUNTER — Inpatient Hospital Stay: Payer: Medicaid Other

## 2019-07-08 DIAGNOSIS — Z5112 Encounter for antineoplastic immunotherapy: Secondary | ICD-10-CM | POA: Diagnosis not present

## 2019-07-08 DIAGNOSIS — C8338 Diffuse large B-cell lymphoma, lymph nodes of multiple sites: Secondary | ICD-10-CM

## 2019-07-08 DIAGNOSIS — Z95828 Presence of other vascular implants and grafts: Secondary | ICD-10-CM

## 2019-07-08 LAB — CBC WITH DIFFERENTIAL (CANCER CENTER ONLY)
Abs Immature Granulocytes: 0.17 10*3/uL — ABNORMAL HIGH (ref 0.00–0.07)
Basophils Absolute: 0.1 10*3/uL (ref 0.0–0.1)
Basophils Relative: 1 %
Eosinophils Absolute: 0.2 10*3/uL (ref 0.0–0.5)
Eosinophils Relative: 2 %
HCT: 33.9 % — ABNORMAL LOW (ref 39.0–52.0)
Hemoglobin: 11.1 g/dL — ABNORMAL LOW (ref 13.0–17.0)
Immature Granulocytes: 1 %
Lymphocytes Relative: 5 %
Lymphs Abs: 0.6 10*3/uL — ABNORMAL LOW (ref 0.7–4.0)
MCH: 31.6 pg (ref 26.0–34.0)
MCHC: 32.7 g/dL (ref 30.0–36.0)
MCV: 96.6 fL (ref 80.0–100.0)
Monocytes Absolute: 1.3 10*3/uL — ABNORMAL HIGH (ref 0.1–1.0)
Monocytes Relative: 10 %
Neutro Abs: 11.1 10*3/uL — ABNORMAL HIGH (ref 1.7–7.7)
Neutrophils Relative %: 81 %
Platelet Count: 334 10*3/uL (ref 150–400)
RBC: 3.51 MIL/uL — ABNORMAL LOW (ref 4.22–5.81)
RDW: 19.5 % — ABNORMAL HIGH (ref 11.5–15.5)
WBC Count: 13.5 10*3/uL — ABNORMAL HIGH (ref 4.0–10.5)
nRBC: 0 % (ref 0.0–0.2)

## 2019-07-08 LAB — CMP (CANCER CENTER ONLY)
ALT: 12 U/L (ref 0–44)
AST: 9 U/L — ABNORMAL LOW (ref 15–41)
Albumin: 3.3 g/dL — ABNORMAL LOW (ref 3.5–5.0)
Alkaline Phosphatase: 136 U/L — ABNORMAL HIGH (ref 38–126)
Anion gap: 8 (ref 5–15)
BUN: 8 mg/dL (ref 8–23)
CO2: 25 mmol/L (ref 22–32)
Calcium: 9 mg/dL (ref 8.9–10.3)
Chloride: 109 mmol/L (ref 98–111)
Creatinine: 0.67 mg/dL (ref 0.61–1.24)
GFR, Est AFR Am: >60 mL/min (ref >60)
GFR, Estimated: >60 mL/min (ref >60)
Glucose, Bld: 81 mg/dL (ref 70–99)
Potassium: 3.8 mmol/L (ref 3.5–5.1)
Sodium: 142 mmol/L (ref 135–145)
Total Bilirubin: <0.2 mg/dL — ABNORMAL LOW (ref 0.3–1.2)
Total Protein: 6.5 g/dL (ref 6.5–8.1)

## 2019-07-08 MED ORDER — SODIUM CHLORIDE 0.9% FLUSH
10.0000 mL | INTRAVENOUS | Status: DC | PRN
  Administered 2019-07-08: 10 mL via INTRAVENOUS
  Filled 2019-07-08: qty 10

## 2019-07-08 MED ORDER — HEPARIN SOD (PORK) LOCK FLUSH 100 UNIT/ML IV SOLN
500.0000 [IU] | Freq: Once | INTRAVENOUS | Status: AC
  Administered 2019-07-08: 11:00:00 500 [IU] via INTRAVENOUS
  Filled 2019-07-08: qty 5

## 2019-07-15 ENCOUNTER — Inpatient Hospital Stay: Payer: Medicaid Other

## 2019-07-15 ENCOUNTER — Other Ambulatory Visit: Payer: Self-pay

## 2019-07-15 DIAGNOSIS — C8338 Diffuse large B-cell lymphoma, lymph nodes of multiple sites: Secondary | ICD-10-CM

## 2019-07-15 DIAGNOSIS — Z95828 Presence of other vascular implants and grafts: Secondary | ICD-10-CM

## 2019-07-15 DIAGNOSIS — Z5112 Encounter for antineoplastic immunotherapy: Secondary | ICD-10-CM | POA: Diagnosis not present

## 2019-07-15 LAB — CBC WITH DIFFERENTIAL (CANCER CENTER ONLY)
Abs Immature Granulocytes: 0.5 K/uL — ABNORMAL HIGH (ref 0.00–0.07)
Basophils Absolute: 0.1 K/uL (ref 0.0–0.1)
Basophils Relative: 1 %
Eosinophils Absolute: 0.1 K/uL (ref 0.0–0.5)
Eosinophils Relative: 1 %
HCT: 39.2 % (ref 39.0–52.0)
Hemoglobin: 12.6 g/dL — ABNORMAL LOW (ref 13.0–17.0)
Immature Granulocytes: 4 %
Lymphocytes Relative: 7 %
Lymphs Abs: 0.9 K/uL (ref 0.7–4.0)
MCH: 30.8 pg (ref 26.0–34.0)
MCHC: 32.1 g/dL (ref 30.0–36.0)
MCV: 95.8 fL (ref 80.0–100.0)
Monocytes Absolute: 1.1 K/uL — ABNORMAL HIGH (ref 0.1–1.0)
Monocytes Relative: 9 %
Neutro Abs: 9.7 K/uL — ABNORMAL HIGH (ref 1.7–7.7)
Neutrophils Relative %: 78 %
Platelet Count: 304 K/uL (ref 150–400)
RBC: 4.09 MIL/uL — ABNORMAL LOW (ref 4.22–5.81)
RDW: 19 % — ABNORMAL HIGH (ref 11.5–15.5)
WBC Count: 12.3 K/uL — ABNORMAL HIGH (ref 4.0–10.5)
nRBC: 0.2 % (ref 0.0–0.2)

## 2019-07-15 LAB — CMP (CANCER CENTER ONLY)
ALT: 12 U/L (ref 0–44)
AST: 12 U/L — ABNORMAL LOW (ref 15–41)
Albumin: 3.5 g/dL (ref 3.5–5.0)
Alkaline Phosphatase: 110 U/L (ref 38–126)
Anion gap: 8 (ref 5–15)
BUN: 15 mg/dL (ref 8–23)
CO2: 23 mmol/L (ref 22–32)
Calcium: 9.1 mg/dL (ref 8.9–10.3)
Chloride: 107 mmol/L (ref 98–111)
Creatinine: 0.76 mg/dL (ref 0.61–1.24)
GFR, Est AFR Am: >60 mL/min
GFR, Estimated: >60 mL/min
Glucose, Bld: 85 mg/dL (ref 70–99)
Potassium: 3.9 mmol/L (ref 3.5–5.1)
Sodium: 138 mmol/L (ref 135–145)
Total Bilirubin: <0.2 mg/dL — ABNORMAL LOW (ref 0.3–1.2)
Total Protein: 7 g/dL (ref 6.5–8.1)

## 2019-07-15 LAB — URIC ACID: Uric Acid, Serum: 3.1 mg/dL — ABNORMAL LOW (ref 3.7–8.6)

## 2019-07-15 MED ORDER — HEPARIN SOD (PORK) LOCK FLUSH 100 UNIT/ML IV SOLN
500.0000 [IU] | Freq: Once | INTRAVENOUS | Status: AC
  Administered 2019-07-15: 10:00:00 500 [IU] via INTRAVENOUS
  Filled 2019-07-15: qty 5

## 2019-07-15 MED ORDER — SODIUM CHLORIDE 0.9% FLUSH
10.0000 mL | INTRAVENOUS | Status: DC | PRN
  Administered 2019-07-15: 10:00:00 10 mL via INTRAVENOUS
  Filled 2019-07-15: qty 10

## 2019-07-16 NOTE — Progress Notes (Signed)
Pharmacist Chemotherapy Monitoring - Follow Up Assessment    I verify that I have reviewed each item in the below checklist:  . Regimen for the patient is scheduled for the appropriate day and plan matches scheduled date. Marland Kitchen Appropriate non-routine labs are ordered dependent on drug ordered. . If applicable, additional medications reviewed and ordered per protocol based on lifetime cumulative doses and/or treatment regimen.   Plan for follow-up and/or issues identified: No . I-vent associated with next due treatment: No . MD and/or nursing notified: No  Philomena Course 07/16/2019 3:39 PM

## 2019-07-17 ENCOUNTER — Ambulatory Visit (HOSPITAL_COMMUNITY)
Admission: RE | Admit: 2019-07-17 | Discharge: 2019-07-17 | Disposition: A | Discharge status: Home / Self Care | Payer: Medicaid Other | Source: Ambulatory Visit | Attending: Physician Assistant | Admitting: Physician Assistant

## 2019-07-17 ENCOUNTER — Other Ambulatory Visit: Payer: Self-pay

## 2019-07-17 ENCOUNTER — Encounter (HOSPITAL_COMMUNITY): Payer: Self-pay

## 2019-07-17 DIAGNOSIS — C8338 Diffuse large B-cell lymphoma, lymph nodes of multiple sites: Secondary | ICD-10-CM

## 2019-07-17 MED ORDER — IOHEXOL 300 MG/ML  SOLN
100.0000 mL | Freq: Once | INTRAMUSCULAR | Status: AC | PRN
  Administered 2019-07-17: 100 mL via INTRAVENOUS

## 2019-07-17 MED ORDER — SODIUM CHLORIDE (PF) 0.9 % IJ SOLN
INTRAMUSCULAR | Status: AC
  Filled 2019-07-17: qty 50

## 2019-07-21 NOTE — Progress Notes (Signed)
[place] OFFICE PROGRESS NOTE  Medicine, [facility] [address] 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  PRIOR THERAPY: None  CURRENT THERAPY: Systemic chemotherapy with R-CHOP every 3 weeks. First dose May 20, 2019.Status post 3 cycles.  INTERVAL HISTORY: [name] 64 y.o. male returns to the clinic today for a follow-up visit.  The patient is feeling fairly well today without any concerning complaints except orthopnea. He has CHF and is followed by cardiology. They are performing routine echocardiograms since he is on doxorubicin. He does not have a PCP at this time. He had one but states their practice closed and is in need of a referral to one.   He is tolerating his chemotherapy well without any adverse side effects.  His sister recently passed away and she was his main source of transportation as well as access to food.  He is in touch with the social worker team as well as the transportation team. His nieces and nephews have been helping deliver food to him as well as pick up his prescriptions. He did not take his prednisone today as prescribed. He was unaware that he had a refill at the pharmacy. He denies any recent fever, chills, night sweats, or weight loss.  He denies any chest pain. As mentioned, he reports orthopnea at night and props up his pillows.  He denies any cough or hemoptysis.  He denies any nausea, vomiting, diarrhea, or constipation.  He used to have significant left upper quadrant pain secondary to splenomegaly which has improved at this time.  He is  currently taking oxycodone as needed for pain.  He denies any headache or visual changes.  He denies any peripheral neuropathy.  He recently had a restaging CT scan performed.  He is here today for evaluation and to review his scan results before starting cycle #4.  MEDICAL HISTORY: Past Medical History:  Diagnosis Date  . CAD (coronary artery disease)    a. 04/2008 Cath: LM nl, LAD 50p, 67m, LCX min irregs, RCA 50p/m, 40d, RPDA 60-70ost; b. 04/2008 CABG x 4: LIMA->LAD, VG->D2, VG->RPDA->RPL.  . Cancer ([place])   . Cocaine abuse ([place])    a. Quit early 2019.  . Ischemic cardiomyopathy    a. LV gram: EF 30-35%, apical/periapical AK.  . Stroke ([place]) 02/2019  . Stroke ([place]) 02/2019  . Tobacco abuse     ALLERGIES:  has No Known Allergies.  MEDICATIONS:  Current Outpatient Medications  Medication Sig Dispense Refill  . acetaminophen (TYLENOL) 325 MG tablet Take 2 tablets (650 mg total) by mouth every 4 (four) hours as needed for mild pain (or temp > 37.5 C (99.5 F)).    Marland Kitchen allopurinol (ZYLOPRIM) 100 MG tablet Take 1 tablet (100 mg total) by mouth 2 (two) times daily. 60 tablet 2  . aspirin EC 81 MG tablet Take 1 tablet (81 mg total) by mouth daily. 30 tablet 3  . docusate sodium (COLACE) 100 MG capsule Take 1 capsule (100 mg total) by mouth every 12 (twelve) hours. 60 capsule 0  . lidocaine-prilocaine (EMLA) cream Apply 1 application topically as needed. Apply a teaspoon amount to  port site an hour prior to chemo therapy, DO NOT rub in, cover with plastic wrap. 30 g 0  . oxyCODONE-acetaminophen (PERCOCET/ROXICET) 5-325 MG tablet Take 1 tablet by mouth every 6 (six) hours as needed for severe pain. 30 tablet 0  . polyethylene glycol (MIRALAX / GLYCOLAX) 17 g packet Take 17 g by mouth daily. 14 each 0  . prochlorperazine (COMPAZINE) 10 MG tablet Take 1 tablet (10 mg total) by mouth every 6 (six) hours as needed for nausea or vomiting. 30 tablet 2  . predniSONE (DELTASONE) 50 MG tablet 2 tablet p.o.  daily for 5 days start with the first day of every chemotherapy cycle. 80 tablet 0   No current facility-administered medications for this visit.    SURGICAL HISTORY:  Past Surgical History:  Procedure Laterality Date  . BYPASS GRAFT  2010  . CARDIAC SURGERY  2010  . IR IMAGING GUIDED PORT INSERTION  05/26/2019  . SUPRACLAVICAL NODE BIOPSY Left 05/08/2019   Procedure: SUPRACLAVICAL NODE BIOPSY;  Surgeon: [name], MD;  Location: [facility] OR;  Service: Thoracic;  Laterality: Left;    REVIEW OF SYSTEMS:   Review of Systems  Constitutional: Negative for appetite change, chills, fatigue, fever and unexpected weight change.  HENT:   Negative for mouth sores, nosebleeds, sore throat and trouble swallowing.   Eyes: Negative for eye problems and icterus.  Respiratory: Positive for orthopnea. Negative for cough, hemoptysis, and wheezing.  Cardiovascular: Negative for chest pain and leg swelling.  Gastrointestinal: Negative for abdominal pain, constipation, diarrhea, nausea and vomiting.  Genitourinary: Negative for bladder incontinence, difficulty urinating, dysuria, frequency and hematuria.   Musculoskeletal: Negative for back pain, gait problem, neck pain and neck stiffness.  Skin: Negative for itching and rash.  Neurological: Negative for dizziness, extremity weakness, gait problem, headaches, light-headedness and seizures.  Hematological: Negative for adenopathy. Does not bruise/bleed easily.  Psychiatric/Behavioral: Negative for confusion, depression and sleep disturbance. The patient is not nervous/anxious.     PHYSICAL EXAMINATION:  Blood pressure 111/83, pulse 74, temperature 98.5 F (36.9 C), temperature source Temporal, resp. rate 17, height 5\' 7"  (1.702 m), weight 117 lb (53.1 kg), SpO2 100 %.  ECOG PERFORMANCE STATUS: 1 - Symptomatic but completely ambulatory  Physical Exam  Constitutional: Oriented to person, place, and time and cachectic appearing male and in no acute  distress  HENT:  Head: Normocephalic and atraumatic.  Mouth/Throat: Oropharynx is clear and moist. No oropharyngeal exudate.  Eyes: Conjunctivae are normal. Right eye exhibits no discharge. Left eye exhibits no discharge. No scleral icterus.  Neck: Normal range of motion. Neck supple.  Cardiovascular: Normal rate, regular rhythm, normal heart sounds and intact distal pulses.   Pulmonary/Chest: Effort normal and breath sounds normal. No respiratory distress. No wheezes. No rales.  Abdominal: Soft. Bowel sounds are normal. Exhibits no distension and no mass. There is no tenderness.  Musculoskeletal: Normal range of motion. Exhibits no edema.  Lymphadenopathy:    No cervical adenopathy.  Neurological: Alert and oriented to person, place, and time. Exhibits normal muscle tone. Gait normal. Coordination normal.  Skin: Skin is warm and dry. No rash noted. Not diaphoretic. No erythema. No pallor.  Psychiatric: Mood, memory and judgment normal.  Vitals reviewed.  LABORATORY DATA: Lab Results  Component Value Date   WBC 5.3 07/22/2019   HGB 13.4 07/22/2019   HCT 40.6 07/22/2019   MCV 95.1 07/22/2019   PLT 305 07/22/2019      Chemistry      Component Value  Date/Time   NA 139 07/22/2019 0829   K 3.9 07/22/2019 0829   CL 106 07/22/2019 0829   CO2 24 07/22/2019 0829   BUN 13 07/22/2019 0829   CREATININE 0.77 07/22/2019 0829      Component Value Date/Time   CALCIUM 9.3 07/22/2019 0829   ALKPHOS 89 07/22/2019 0829   AST 16 07/22/2019 0829   ALT 20 07/22/2019 0829   BILITOT 0.3 07/22/2019 0829       RADIOGRAPHIC STUDIES:  CT Chest W Contrast  Result Date: 07/17/2019 CLINICAL DATA:  Restaging of diffuse large B-cell lymphoma. Ongoing chemotherapy. EXAM: CT CHEST, ABDOMEN, AND PELVIS WITH CONTRAST TECHNIQUE: Multidetector CT imaging of the chest, abdomen and pelvis was performed following the standard protocol during bolus administration of intravenous contrast. CONTRAST:  130mL  OMNIPAQUE IOHEXOL 300 MG/ML  SOLN COMPARISON:  PET-CT from 04/18/2019 FINDINGS: CT CHEST FINDINGS Cardiovascular: Right Port-A-Cath tip: SVC. Prior CABG. Coronary atherosclerosis. Mild cardiomegaly. Mediastinum/Nodes: Interval resection of the left supraclavicular lymph node shown on prior PET-CT. Pleuropericardial nodularity along the right heart border has resolved. Lungs/Pleura: Centrilobular emphysema. Marked reduction in the bulky peribronchovascular masslike appearance shown on the prior PET-CT. There is continued atelectasis centrally in the right middle lobe but the confluent mass measuring 8.5 by 6.2 cm on the prior PET-CT has nearly completely resolved. The atelectatic region primarily involving the right middle lobe measures about 4.8 by 3.2 cm on image 83/4 and could have some retained underlying nodular component, but the appearance is markedly improved. 0.5 by 0.7 cm right upper lobe nodule on image 36/4, previously about 0.5 by 0.5 cm, surveillance suggested. There is some localized pleural thickening along the right lung base which seems to be fatty density and probably benign. Reduced atelectasis in the posterior basal segment left lower lobe. Improved definition of small left upper lobe subpleural nodules including a 0.5 by 0.6 cm left upper lobe nodule on image 62/4, but these do not appear increased in size. A separate left upper lobe nodule measuring 0.7 by 0.4 cm on image 72/4 is stable. Musculoskeletal: Prior median sternotomy. Mild thoracic spondylosis. CT ABDOMEN PELVIS FINDINGS Hepatobiliary: Unremarkable Pancreas: Mildly prominent dorsal pancreatic duct. Spleen: Heterogeneous/geographic low density in the spleen. The spleen measures 9.0 by 6.6 by 8.0 cm (volume = 250 cm^3). Previously the spleen was much larger and had extensive tumor, measuring 12.8 by 10.3 by 14.5 cm (volume = 1000 cm^3). Current hypodensities likely reflect residua from effectively treated tumor. Adrenals/Urinary Tract:  Mild scarring in the left kidney upper pole. Adrenal glands unremarkable. The urinary bladder appears normal. Stomach/Bowel: Unremarkable Vascular/Lymphatic: The extensive retrocrural, right gastric,, periaortic, left retroperitoneal, and pelvic adenopathy is markedly improved and in many places resolved. Index left inguinal node 1.0 cm in short axis on image 113/2, previously 2.4 cm by my measurements. There is smooth an ill-defined soft tissue stranding at locations previously occupied by bulky tumor particularly in the retroperitoneum and retrocrural region. The tumor nodules along the left posterior perirenal fascia have resolved. Reproductive: Unremarkable Other: No supplemental non-categorized findings. Musculoskeletal: Prominent and asymmetric degenerative right hip arthropathy. Lower lumbar spondylosis and degenerative disc disease causing bilateral foraminal impingement at L5-S1. Deformity of the right femoral neck likely from an old fracture. IMPRESSION: 1. Prominent reduction in size of the bulky tumor in the chest, abdomen, and pelvis, with resolution of the left supraclavicular adenopathy, resolution of the pleuropericardial nodularity along the right heart border, marked reduction in the right infrahilar tumor, and near complete resolution of  the bulky tumor in the retroperitoneum and retrocrural region. There continues to be atelectasis centrally in the right middle lobe, but the atelectatic region primarily involving the right middle lobe is markedly improved. 2. Several small pulmonary nodules are present, one of which is mildly more conspicuous compared to the prior PET-CT, meriting surveillance. 3. Other imaging findings of potential clinical significance: Mild cardiomegaly. Prior CABG. Mildly prominent dorsal pancreatic duct, nonspecific. Prominent and asymmetric degenerative right hip arthropathy associated with chronic femoral neck deformity. Lower lumbar spondylosis and degenerative disc  disease causing bilateral foraminal impingement at L5-S1. Mildly prominent dorsal pancreatic duct. 4. Emphysema and aortic atherosclerosis. Aortic Atherosclerosis (ICD10-I70.0) and Emphysema (ICD10-J43.9). Electronically Signed   By: Van Clines M.D.   On: 07/17/2019 13:29   CT Abdomen Pelvis W Contrast  Result Date: 07/17/2019 : The complete report for the CT of the chest, abdomen, and pelvis is available under accession number [id number] [facility]. Electronically Signed   By: Van Clines M.D.   On: 07/17/2019 13:33     ASSESSMENT/PLAN:  This is a very pleasant 64 year old African-American male recently diagnosed with stage IV large B cell non-Hodgkin's lymphoma. He was diagnosed in February 2021. He presented with extensive adenopathy in the abdomen and pelvis with abulky right perihilar mass and left supraclavicular lymphadenopathy and spleen enlargement. He was diagnosed in February 2021.  The patient is currently undergoing systemic chemotherapy with R-CHOP with Neulasta support IV every 3 weeks. He is on a reduced dose of doxorubicin due to his history of congestive heart failure (last EF 40-45%). He is status post 3 cycles.   The patient was seen with Dr. Julien Nordmann today.   The patient recently had a restaging CT scan performed.  Dr. Julien Nordmann personally and independently reviewed the scan and discussed the results with the patient.  The scan showed a positive response to treatment with significant reduction in size of the adenopathy/tumor. Recommend the patient continue on treatment at the same dose.  He will receive cycle #4 today scheduled.   We will see him back for follow-up visit in 3 weeks for evaluation before starting cycle #5.   He will continue to take oxycodone as needed for pain.   I have sent a refill of prednisone to his pharmacy. We reviewed how to take this medication. He was instructed to take it upon returning home today and to take two tablets for 5  consecuative days.   We will continue to follow with social work/transportation services.  He will continue to follow with cardiology for his history of CHF.  They are planning on performing follow-up echocardiograms due to his history of CHF and with him being on doxorubicin which can be cardiotoxic.  He is currently on a reduced dose of doxorubicin.  I have placed a referral to the internal medicine residency clinic for the patient to establish care.   The patient was advised to call immediately if he has any concerning symptoms in the interval. The patient voices understanding of current disease status and treatment options and is in agreement with the current care plan. All questions were answered. The patient knows to call the clinic with any problems, questions or concerns. We can certainly see the patient much sooner if necessary.   Orders Placed This Encounter  Procedures  . Ambulatory referral to Internal Medicine    Referral Priority:   Routine    Referral Type:   Consultation    Referral Reason:   Specialty Services Required  Requested Specialty:   Internal Medicine    Number of Visits Requested:   [place], PA-C 07/22/19  ADDENDUM: Hematology/Oncology Attending: I had a face-to-face encounter with the patient today.  I recommended his care plan.  This is a very pleasant 64 years old African-American male with stage IV large B cell non-Hodgkin lymphoma.  He is currently undergoing systemic chemotherapy with R-CHOP status post 3 cycles.  The patient has been tolerating this treatment well and he started feeling much better even after the first cycle. He had repeat CT scan of the chest, abdomen pelvis performed recently.  I personally and independently reviewed the scan images and discussed the results with the patient today. His scan showed significant improvement of his disease. I recommended for the patient to proceed with cycle #4 today as  planned. We will see him back for follow-up visit in 3 weeks for evaluation before starting cycle #5. The patient was advised to call immediately if he has any concerning symptoms in the interval.  Disclaimer: This note was dictated with voice recognition software. Similar sounding words can inadvertently be transcribed and may be missed upon review. Eilleen Kempf, MD 07/22/19

## 2019-07-22 ENCOUNTER — Encounter: Payer: Self-pay | Admitting: Physician Assistant

## 2019-07-22 ENCOUNTER — Other Ambulatory Visit: Payer: Self-pay

## 2019-07-22 ENCOUNTER — Inpatient Hospital Stay: Payer: Medicaid Other

## 2019-07-22 ENCOUNTER — Inpatient Hospital Stay (HOSPITAL_BASED_OUTPATIENT_CLINIC_OR_DEPARTMENT_OTHER): Payer: Medicaid Other | Admitting: Physician Assistant

## 2019-07-22 VITALS — BP 111/83 | HR 74 | Temp 98.5°F | Resp 17 | Ht 67.0 in | Wt 117.0 lb

## 2019-07-22 VITALS — BP 92/67 | HR 76 | Temp 97.8°F | Resp 18

## 2019-07-22 DIAGNOSIS — C8338 Diffuse large B-cell lymphoma, lymph nodes of multiple sites: Secondary | ICD-10-CM | POA: Diagnosis not present

## 2019-07-22 DIAGNOSIS — Z5111 Encounter for antineoplastic chemotherapy: Secondary | ICD-10-CM | POA: Diagnosis not present

## 2019-07-22 DIAGNOSIS — Z5112 Encounter for antineoplastic immunotherapy: Secondary | ICD-10-CM | POA: Diagnosis not present

## 2019-07-22 DIAGNOSIS — Z95828 Presence of other vascular implants and grafts: Secondary | ICD-10-CM

## 2019-07-22 LAB — CBC WITH DIFFERENTIAL (CANCER CENTER ONLY)
Abs Immature Granulocytes: 0.02 10*3/uL (ref 0.00–0.07)
Basophils Absolute: 0.1 10*3/uL (ref 0.0–0.1)
Basophils Relative: 2 %
Eosinophils Absolute: 0.1 10*3/uL (ref 0.0–0.5)
Eosinophils Relative: 1 %
HCT: 40.6 % (ref 39.0–52.0)
Hemoglobin: 13.4 g/dL (ref 13.0–17.0)
Immature Granulocytes: 0 %
Lymphocytes Relative: 16 %
Lymphs Abs: 0.9 10*3/uL (ref 0.7–4.0)
MCH: 31.4 pg (ref 26.0–34.0)
MCHC: 33 g/dL (ref 30.0–36.0)
MCV: 95.1 fL (ref 80.0–100.0)
Monocytes Absolute: 1 10*3/uL (ref 0.1–1.0)
Monocytes Relative: 18 %
Neutro Abs: 3.4 10*3/uL (ref 1.7–7.7)
Neutrophils Relative %: 63 %
Platelet Count: 305 10*3/uL (ref 150–400)
RBC: 4.27 MIL/uL (ref 4.22–5.81)
RDW: 18.4 % — ABNORMAL HIGH (ref 11.5–15.5)
WBC Count: 5.3 10*3/uL (ref 4.0–10.5)
nRBC: 0 % (ref 0.0–0.2)

## 2019-07-22 LAB — CMP (CANCER CENTER ONLY)
ALT: 20 U/L (ref 0–44)
AST: 16 U/L (ref 15–41)
Albumin: 3.7 g/dL (ref 3.5–5.0)
Alkaline Phosphatase: 89 U/L (ref 38–126)
Anion gap: 9 (ref 5–15)
BUN: 13 mg/dL (ref 8–23)
CO2: 24 mmol/L (ref 22–32)
Calcium: 9.3 mg/dL (ref 8.9–10.3)
Chloride: 106 mmol/L (ref 98–111)
Creatinine: 0.77 mg/dL (ref 0.61–1.24)
GFR, Est AFR Am: >60 mL/min (ref >60)
GFR, Estimated: >60 mL/min (ref >60)
Glucose, Bld: 79 mg/dL (ref 70–99)
Potassium: 3.9 mmol/L (ref 3.5–5.1)
Sodium: 139 mmol/L (ref 135–145)
Total Bilirubin: 0.3 mg/dL (ref 0.3–1.2)
Total Protein: 7 g/dL (ref 6.5–8.1)

## 2019-07-22 MED ORDER — ACETAMINOPHEN 325 MG PO TABS
650.0000 mg | ORAL_TABLET | Freq: Once | ORAL | Status: AC
  Administered 2019-07-22: 650 mg via ORAL

## 2019-07-22 MED ORDER — DOXORUBICIN HCL CHEMO IV INJECTION 2 MG/ML
25.0000 mg/m2 | Freq: Once | INTRAVENOUS | Status: AC
  Administered 2019-07-22: 38 mg via INTRAVENOUS
  Filled 2019-07-22: qty 19

## 2019-07-22 MED ORDER — DIPHENHYDRAMINE HCL 25 MG PO CAPS
ORAL_CAPSULE | ORAL | Status: AC
  Filled 2019-07-22: qty 2

## 2019-07-22 MED ORDER — SODIUM CHLORIDE 0.9 % IV SOLN
Freq: Once | INTRAVENOUS | Status: AC
  Filled 2019-07-22: qty 250

## 2019-07-22 MED ORDER — PREDNISONE 50 MG PO TABS: ORAL_TABLET | ORAL | 0 refills | Status: DC

## 2019-07-22 MED ORDER — SODIUM CHLORIDE 0.9 % IV SOLN
150.0000 mg | Freq: Once | INTRAVENOUS | Status: AC
  Administered 2019-07-22: 150 mg via INTRAVENOUS
  Filled 2019-07-22: qty 150

## 2019-07-22 MED ORDER — DIPHENHYDRAMINE HCL 25 MG PO CAPS
50.0000 mg | ORAL_CAPSULE | Freq: Once | ORAL | Status: AC
  Administered 2019-07-22: 50 mg via ORAL

## 2019-07-22 MED ORDER — ACETAMINOPHEN 325 MG PO TABS
ORAL_TABLET | ORAL | Status: AC
  Filled 2019-07-22: qty 2

## 2019-07-22 MED ORDER — VINCRISTINE SULFATE CHEMO INJECTION 1 MG/ML
2.0000 mg | Freq: Once | INTRAVENOUS | Status: AC
  Administered 2019-07-22: 2 mg via INTRAVENOUS
  Filled 2019-07-22: qty 2

## 2019-07-22 MED ORDER — SODIUM CHLORIDE 0.9 % IV SOLN
10.0000 mg | Freq: Once | INTRAVENOUS | Status: AC
  Administered 2019-07-22: 10 mg via INTRAVENOUS
  Filled 2019-07-22: qty 10

## 2019-07-22 MED ORDER — PALONOSETRON HCL INJECTION 0.25 MG/5ML
INTRAVENOUS | Status: AC
  Filled 2019-07-22: qty 5

## 2019-07-22 MED ORDER — SODIUM CHLORIDE 0.9% FLUSH
10.0000 mL | INTRAVENOUS | Status: DC | PRN
  Filled 2019-07-22: qty 10

## 2019-07-22 MED ORDER — SODIUM CHLORIDE 0.9 % IV SOLN
750.0000 mg/m2 | Freq: Once | INTRAVENOUS | Status: AC
  Administered 2019-07-22: 1120 mg via INTRAVENOUS
  Filled 2019-07-22: qty 56

## 2019-07-22 MED ORDER — SODIUM CHLORIDE 0.9 % IV SOLN
375.0000 mg/m2 | Freq: Once | INTRAVENOUS | Status: AC
  Administered 2019-07-22: 13:00:00 600 mg via INTRAVENOUS
  Filled 2019-07-22: qty 50

## 2019-07-22 MED ORDER — PALONOSETRON HCL INJECTION 0.25 MG/5ML
0.2500 mg | Freq: Once | INTRAVENOUS | Status: AC
  Administered 2019-07-22: 0.25 mg via INTRAVENOUS

## 2019-07-22 MED ORDER — HEPARIN SOD (PORK) LOCK FLUSH 100 UNIT/ML IV SOLN
500.0000 [IU] | Freq: Once | INTRAVENOUS | Status: DC | PRN
  Filled 2019-07-22: qty 5

## 2019-07-22 MED ORDER — SODIUM CHLORIDE 0.9% FLUSH
10.0000 mL | INTRAVENOUS | Status: DC | PRN
  Administered 2019-07-22: 10 mL via INTRAVENOUS
  Filled 2019-07-22: qty 10

## 2019-07-22 NOTE — Patient Instructions (Signed)
[place] Discharge Instructions for Patients Receiving Chemotherapy  Today you received the following chemotherapy agents: rituximab, doxorubicin, vincristine, and cyclophosphamide.  To help prevent nausea and vomiting after your treatment, we encourage you to take your nausea medication as directed.   If you develop nausea and vomiting that is not controlled by your nausea medication, call the clinic.   BELOW ARE SYMPTOMS THAT SHOULD BE REPORTED IMMEDIATELY:  *FEVER GREATER THAN 100.5 F  *CHILLS WITH OR WITHOUT FEVER  NAUSEA AND VOMITING THAT IS NOT CONTROLLED WITH YOUR NAUSEA MEDICATION  *UNUSUAL SHORTNESS OF BREATH  *UNUSUAL BRUISING OR BLEEDING  TENDERNESS IN MOUTH AND THROAT WITH OR WITHOUT PRESENCE OF ULCERS  *URINARY PROBLEMS  *BOWEL PROBLEMS  UNUSUAL RASH Items with * indicate a potential emergency and should be followed up as soon as possible.  Feel free to call the clinic should you have any questions or concerns. The clinic phone number is [phone].  Please show the [place] at check-in to the Emergency Department and triage nurse.   Non-Hodgkin Lymphoma, Adult Non-Hodgkin lymphoma (NHL) is a cancer of the lymphatic system. The lymphatic system is part of the body's defense (immune) system. It protects the body from:  Infections.  Germs.  Diseases. NHL affects a type of white blood cell called lymphocytes. There are many different types of NHL. NHL can occur in the B lymphocytes (B cells), T lymphocytes (T cells), and natural killer (NK) cells. The kind of NHL you have depends on the type of cells it affects and how quickly it grows and spreads. What are the causes? The cause of this condition is not known. What increases the risk? You are more likely to develop this condition if:  You have a weak immune system, especially after receiving an organ from a donor (transplant).  You have an autoimmune disease like rheumatoid  arthritis.  You have infections from bacteria or viruses. These include: ? Human immunodeficiency virus (HIV). ? Epstein-Barr virus.  You are over the age of 48.  You are male.  You are Caucasian.  You have been treated with high-energy beams that destroy cancer cells(radiation therapy) for other cancers. What are the signs or symptoms? Symptoms of this condition depend on the type, where it is in the body, and whether it is fast-growing or slow-growing. Some people may not have any symptoms. Common symptoms include:  Swelling of the collections of tissue that filter bacteria, viruses, and waste from the bloodstream (lymph nodes) in your neck, armpits, or groin.  Fever.  Unexplained weight loss.  Night sweats.  Tiredness (fatigue). How is this diagnosed? This condition may be diagnosed based on:  A physical exam and medical history.  Chest X-ray.  Testing of: ? Tissue from your lymph gland or bone marrow (biopsy). ? Spinal fluid (lumbar puncture or spinal tap). ? Abdominal or chest fluid. ? Blood. ? Urine.  Imaging tests, such as: ? CT scan. ? PET scan (positron emission tomography). ? MRI. Your health care provider will do more tests to determine whether the cancer has spread, where it has spread to, and how severe it is. This is called staging. You may need to have other tests to look for certain proteins or gene mutations that may help with treatment planning. How is this treated? Treatment for this condition depends on your symptoms, the type and stage of NHL, and how fast it is spreading. Treatment may include:  Radiation therapy.  Chemotherapy. This uses medicine to  destroy cancer cells.  Biological therapy. This helps to control the spread of cancer by boosting the body's immune system.  Targeted medicines to treat specific proteins or gene mutations that are present in your lymphoma.  Bone marrow or stem cell transplantation. In this procedure, you  receive high doses of chemotherapy followed by a healthy bone marrow or stem cell transplant from a donor.  Participating in clinical trials to see if new (experimental) treatments are effective.  Surgery to remove the lymphoma.  Blood or platelets from a donor (transfusion). This may be done if your blood counts are low. Follow these instructions at home: Medicines  Take over-the-counter and prescription medicines only as told by your health care provider.  Do not take dietary supplements or herbal medicines unless your health care provider tells you to take them. Some supplements can interfere with how well your treatment works. Lifestyle  Get enough sleep. Most adults need 6-8 hours of sleep each night. During treatment, you may need more sleep.  Consider joining a cancer support group. Ask your health care provider for more information about local and online support groups. General instructions   Drink enough fluid to keep your urine pale yellow.  Wash your hands often with soap and water. If soap and water are not available, use hand sanitizer.  Tell your cancer care team if you develop side effects from treatment. They may be able to recommend ways to manage them.  Keep all follow-up visits as told by your health care provider. This is important. Where to find more information  American Cancer Society: www.cancer.org  Leukemia and Lymphoma Society: PreviewPal.pl  National Cancer Institute ([place]): www.cancer.gov Contact a health care provider if you:  Have new lumps under your arms, on your neck, or near your groin.  Have painful lymph nodes.  Have nausea or vomiting.  Have constipation or diarrhea.  Have trouble urinating or painful urination.  Have a skin rash.  Have abdominal pain.  Have joint pain.  Feel dizzy or light-headed.  Have a fever or chills. Get help right away if you:  Have trouble breathing or chest pain.  Have blood in your urine or  stool. Summary  Non-Hodgkin lymphoma (NHL) is a cancer of the lymphatic system. The lymphatic system is part of your body's defense (immune) system.  Treatment for this condition depends on your symptoms, the type and stage of NHL, and how fast it is spreading.  Tell your cancer care team if you develop side effects from treatment. They may be able to recommend ways to manage them.  Contact your health care provider if you have new lumps under your arms, on your neck, or near your groin. This information is not intended to replace advice given to you by your health care provider. Make sure you discuss any questions you have with your health care provider. Document Revised: 05/30/2018 Document Reviewed: 05/30/2018 Elsevier Patient Education  [place].

## 2019-07-23 ENCOUNTER — Telehealth: Payer: Self-pay | Admitting: Physician Assistant

## 2019-07-23 NOTE — Telephone Encounter (Signed)
Scheduled per los. Called and left msg. Mailed printout  °

## 2019-07-24 ENCOUNTER — Inpatient Hospital Stay: Payer: Medicaid Other

## 2019-07-24 ENCOUNTER — Other Ambulatory Visit: Payer: Self-pay

## 2019-07-24 VITALS — BP 100/66 | HR 70 | Temp 98.9°F | Resp 16

## 2019-07-24 DIAGNOSIS — Z5112 Encounter for antineoplastic immunotherapy: Secondary | ICD-10-CM | POA: Diagnosis not present

## 2019-07-24 DIAGNOSIS — C8338 Diffuse large B-cell lymphoma, lymph nodes of multiple sites: Secondary | ICD-10-CM

## 2019-07-24 MED ORDER — PEGFILGRASTIM-JMDB 6 MG/0.6ML ~~LOC~~ SOSY
6.0000 mg | PREFILLED_SYRINGE | Freq: Once | SUBCUTANEOUS | Status: AC
  Administered 2019-07-24: 6 mg via SUBCUTANEOUS

## 2019-07-24 MED ORDER — PEGFILGRASTIM-JMDB 6 MG/0.6ML ~~LOC~~ SOSY
PREFILLED_SYRINGE | SUBCUTANEOUS | Status: AC
  Filled 2019-07-24: qty 0.6

## 2019-07-24 NOTE — Patient Instructions (Signed)

## 2019-07-25 ENCOUNTER — Telehealth: Payer: Self-pay | Admitting: General Practice

## 2019-07-25 NOTE — Telephone Encounter (Signed)
[place] CSW Progress Notes  Call from niece [place].  She has many concerns about her uncle - he is falling often at the hotel, he is increasingly weak and "does not move around a lot at all."  Is concerned he will fall and hit his head.  Aunt who used to be quite involved in his care passed away in early 07-20-2022.  At this point, family members are bringing him food and checking on him once/day.  Niece lives 1.5 hours away and cannot provide consistent supervision for her uncle.  She is requesting that he be placed in a SNF or ALF/FCH.  Explained that placement on outpatient basis is better done by PCP as they have complete knowledge of required information for completing FL2.  CSW provided her with listing of all ALF, [facility] and SNF facilities in [facility], as well as information on the placement process.  She will work w his neurologist and/or CP to accomplish this task.  Patient does not have a copy of his Medicaid card nor does he know his PCP.  CSW called [place] DSS - they viewed his card and he is not assigned to a PCP per front desk.  CSW left VM for his Medicaid worker Governor Rooks [phone]) requesting a call back to determine how best to help patient.  Edwyna Shell, LCSW Clinical Social Worker Phone:  [phone] Cell:  [phone]

## 2019-07-25 NOTE — Telephone Encounter (Signed)
Pt has to call himself for PCP referral.

## 2019-07-29 ENCOUNTER — Other Ambulatory Visit: Payer: Self-pay

## 2019-07-29 ENCOUNTER — Inpatient Hospital Stay: Payer: Medicaid Other

## 2019-07-29 ENCOUNTER — Other Ambulatory Visit: Payer: Self-pay | Admitting: Lab

## 2019-07-29 ENCOUNTER — Inpatient Hospital Stay: Payer: Medicaid Other | Attending: Internal Medicine

## 2019-07-29 DIAGNOSIS — Z5189 Encounter for other specified aftercare: Secondary | ICD-10-CM | POA: Diagnosis not present

## 2019-07-29 DIAGNOSIS — Z5112 Encounter for antineoplastic immunotherapy: Secondary | ICD-10-CM | POA: Insufficient documentation

## 2019-07-29 DIAGNOSIS — J439 Emphysema, unspecified: Secondary | ICD-10-CM | POA: Insufficient documentation

## 2019-07-29 DIAGNOSIS — C8338 Diffuse large B-cell lymphoma, lymph nodes of multiple sites: Secondary | ICD-10-CM

## 2019-07-29 DIAGNOSIS — Z1152 Encounter for screening for COVID-19: Secondary | ICD-10-CM | POA: Diagnosis not present

## 2019-07-29 DIAGNOSIS — Z5111 Encounter for antineoplastic chemotherapy: Secondary | ICD-10-CM | POA: Insufficient documentation

## 2019-07-29 DIAGNOSIS — R222 Localized swelling, mass and lump, trunk: Secondary | ICD-10-CM | POA: Insufficient documentation

## 2019-07-29 DIAGNOSIS — Z95828 Presence of other vascular implants and grafts: Secondary | ICD-10-CM

## 2019-07-29 DIAGNOSIS — C8330 Diffuse large B-cell lymphoma, unspecified site: Secondary | ICD-10-CM | POA: Diagnosis present

## 2019-07-29 LAB — CBC WITH DIFFERENTIAL (CANCER CENTER ONLY)
Abs Immature Granulocytes: 0.25 10*3/uL — ABNORMAL HIGH (ref 0.00–0.07)
Basophils Absolute: 0.1 10*3/uL (ref 0.0–0.1)
Basophils Relative: 1 %
Eosinophils Absolute: 0.1 10*3/uL (ref 0.0–0.5)
Eosinophils Relative: 1 %
HCT: 34.8 % — ABNORMAL LOW (ref 39.0–52.0)
Hemoglobin: 11.5 g/dL — ABNORMAL LOW (ref 13.0–17.0)
Immature Granulocytes: 2 %
Lymphocytes Relative: 4 %
Lymphs Abs: 0.6 10*3/uL — ABNORMAL LOW (ref 0.7–4.0)
MCH: 31.5 pg (ref 26.0–34.0)
MCHC: 33 g/dL (ref 30.0–36.0)
MCV: 95.3 fL (ref 80.0–100.0)
Monocytes Absolute: 1.3 10*3/uL — ABNORMAL HIGH (ref 0.1–1.0)
Monocytes Relative: 8 %
Neutro Abs: 12.7 10*3/uL — ABNORMAL HIGH (ref 1.7–7.7)
Neutrophils Relative %: 84 %
Platelet Count: 235 10*3/uL (ref 150–400)
RBC: 3.65 MIL/uL — ABNORMAL LOW (ref 4.22–5.81)
RDW: 17.3 % — ABNORMAL HIGH (ref 11.5–15.5)
WBC Count: 15.1 10*3/uL — ABNORMAL HIGH (ref 4.0–10.5)
nRBC: 0 % (ref 0.0–0.2)

## 2019-07-29 LAB — CMP (CANCER CENTER ONLY)
ALT: 16 U/L (ref 0–44)
AST: 11 U/L — ABNORMAL LOW (ref 15–41)
Albumin: 3.6 g/dL (ref 3.5–5.0)
Alkaline Phosphatase: 124 U/L (ref 38–126)
Anion gap: 7 (ref 5–15)
BUN: 12 mg/dL (ref 8–23)
CO2: 27 mmol/L (ref 22–32)
Calcium: 9 mg/dL (ref 8.9–10.3)
Chloride: 102 mmol/L (ref 98–111)
Creatinine: 0.66 mg/dL (ref 0.61–1.24)
GFR, Est AFR Am: >60 mL/min (ref >60)
GFR, Estimated: >60 mL/min (ref >60)
Glucose, Bld: 73 mg/dL (ref 70–99)
Potassium: 3.7 mmol/L (ref 3.5–5.1)
Sodium: 136 mmol/L (ref 135–145)
Total Bilirubin: 0.5 mg/dL (ref 0.3–1.2)
Total Protein: 6.7 g/dL (ref 6.5–8.1)

## 2019-07-29 MED ORDER — SODIUM CHLORIDE 0.9% FLUSH
10.0000 mL | INTRAVENOUS | Status: DC | PRN
  Administered 2019-07-29: 10 mL via INTRAVENOUS
  Filled 2019-07-29: qty 10

## 2019-07-29 MED ORDER — HEPARIN SOD (PORK) LOCK FLUSH 100 UNIT/ML IV SOLN
500.0000 [IU] | Freq: Once | INTRAVENOUS | Status: AC
  Administered 2019-07-29: 10:00:00 500 [IU] via INTRAVENOUS
  Filled 2019-07-29: qty 5

## 2019-08-04 ENCOUNTER — Other Ambulatory Visit: Payer: Self-pay | Admitting: Internal Medicine

## 2019-08-04 ENCOUNTER — Telehealth: Payer: Self-pay | Admitting: Medical Oncology

## 2019-08-04 DIAGNOSIS — R918 Other nonspecific abnormal finding of lung field: Secondary | ICD-10-CM

## 2019-08-04 MED ORDER — OXYCODONE-ACETAMINOPHEN 5-325 MG PO TABS: 1.0000 | ORAL_TABLET | Freq: Four times a day (QID) | ORAL | 0 refills | Status: DC | PRN

## 2019-08-04 NOTE — Telephone Encounter (Signed)
Fill requsted for oxycodone.

## 2019-08-05 ENCOUNTER — Other Ambulatory Visit: Payer: Self-pay

## 2019-08-05 ENCOUNTER — Inpatient Hospital Stay: Payer: Medicaid Other

## 2019-08-05 DIAGNOSIS — Z95828 Presence of other vascular implants and grafts: Secondary | ICD-10-CM

## 2019-08-05 DIAGNOSIS — Z5112 Encounter for antineoplastic immunotherapy: Secondary | ICD-10-CM | POA: Diagnosis not present

## 2019-08-05 DIAGNOSIS — C8338 Diffuse large B-cell lymphoma, lymph nodes of multiple sites: Secondary | ICD-10-CM

## 2019-08-05 LAB — CBC WITH DIFFERENTIAL (CANCER CENTER ONLY)
Abs Immature Granulocytes: 0.33 10*3/uL — ABNORMAL HIGH (ref 0.00–0.07)
Basophils Absolute: 0.1 10*3/uL (ref 0.0–0.1)
Basophils Relative: 1 %
Eosinophils Absolute: 0.1 10*3/uL (ref 0.0–0.5)
Eosinophils Relative: 0 %
HCT: 39.6 % (ref 39.0–52.0)
Hemoglobin: 12.9 g/dL — ABNORMAL LOW (ref 13.0–17.0)
Immature Granulocytes: 2 %
Lymphocytes Relative: 6 %
Lymphs Abs: 0.8 10*3/uL (ref 0.7–4.0)
MCH: 31.5 pg (ref 26.0–34.0)
MCHC: 32.6 g/dL (ref 30.0–36.0)
MCV: 96.8 fL (ref 80.0–100.0)
Monocytes Absolute: 0.9 10*3/uL (ref 0.1–1.0)
Monocytes Relative: 7 %
Neutro Abs: 11.3 10*3/uL — ABNORMAL HIGH (ref 1.7–7.7)
Neutrophils Relative %: 84 %
Platelet Count: 255 10*3/uL (ref 150–400)
RBC: 4.09 MIL/uL — ABNORMAL LOW (ref 4.22–5.81)
RDW: 17.6 % — ABNORMAL HIGH (ref 11.5–15.5)
WBC Count: 13.5 10*3/uL — ABNORMAL HIGH (ref 4.0–10.5)
nRBC: 0 % (ref 0.0–0.2)

## 2019-08-05 LAB — URIC ACID: Uric Acid, Serum: 5.3 mg/dL (ref 3.7–8.6)

## 2019-08-05 LAB — CMP (CANCER CENTER ONLY)
ALT: 16 U/L (ref 0–44)
AST: 13 U/L — ABNORMAL LOW (ref 15–41)
Albumin: 3.7 g/dL (ref 3.5–5.0)
Alkaline Phosphatase: 102 U/L (ref 38–126)
Anion gap: 10 (ref 5–15)
BUN: 15 mg/dL (ref 8–23)
CO2: 22 mmol/L (ref 22–32)
Calcium: 9.1 mg/dL (ref 8.9–10.3)
Chloride: 108 mmol/L (ref 98–111)
Creatinine: 0.83 mg/dL (ref 0.61–1.24)
GFR, Est AFR Am: >60 mL/min (ref >60)
GFR, Estimated: >60 mL/min (ref >60)
Glucose, Bld: 80 mg/dL (ref 70–99)
Potassium: 4.1 mmol/L (ref 3.5–5.1)
Sodium: 140 mmol/L (ref 135–145)
Total Bilirubin: <0.2 mg/dL — ABNORMAL LOW (ref 0.3–1.2)
Total Protein: 7 g/dL (ref 6.5–8.1)

## 2019-08-05 MED ORDER — SODIUM CHLORIDE 0.9% FLUSH
10.0000 mL | INTRAVENOUS | Status: DC | PRN
  Filled 2019-08-05: qty 10

## 2019-08-05 MED ORDER — HEPARIN SOD (PORK) LOCK FLUSH 100 UNIT/ML IV SOLN
500.0000 [IU] | Freq: Once | INTRAVENOUS | Status: DC
  Filled 2019-08-05: qty 5

## 2019-08-05 NOTE — Patient Instructions (Signed)

## 2019-08-06 ENCOUNTER — Encounter: Payer: Self-pay | Admitting: Critical Care Medicine

## 2019-08-06 NOTE — Progress Notes (Signed)
Pharmacist Chemotherapy Monitoring - Follow Up Assessment    I verify that I have reviewed each item in the below checklist:  . Regimen for the patient is scheduled for the appropriate day and plan matches scheduled date. Marland Kitchen Appropriate non-routine labs are ordered dependent on drug ordered. . If applicable, additional medications reviewed and ordered per protocol based on lifetime cumulative doses and/or treatment regimen.   Plan for follow-up and/or issues identified: No . I-vent associated with next due treatment: No . MD and/or nursing notified: No  Romualdo Bolk [facility] 08/06/2019 4:16 PM

## 2019-08-07 ENCOUNTER — Telehealth: Payer: Self-pay | Admitting: Medical Oncology

## 2019-08-07 ENCOUNTER — Encounter: Payer: Self-pay | Admitting: General Practice

## 2019-08-07 ENCOUNTER — Telehealth: Payer: Self-pay

## 2019-08-07 ENCOUNTER — Ambulatory Visit: Payer: Medicaid Other | Attending: Internal Medicine

## 2019-08-07 DIAGNOSIS — Z23 Encounter for immunization: Secondary | ICD-10-CM

## 2019-08-07 NOTE — Progress Notes (Signed)
   Covid-19 Vaccination Clinic  Name:  [name]    MRN: [id number] DOB: 05/10/1955  08/07/2019  [name] was observed post Covid-19 immunization for 15 minutes without incident. He was provided with Vaccine Information Sheet and instruction to access the V-Safe system.   [name] was instructed to call 911 with any severe reactions post vaccine: Marland Kitchen Difficulty breathing  . Swelling of face and throat  . A fast heartbeat  . A bad rash all over body  . Dizziness and weakness   Immunizations Administered    Name Date Dose VIS Date Route   Moderna COVID-19 Vaccine 08/07/2019 10:42 AM 0.5 mL 02/2019 Intramuscular   Manufacturer: Moderna   Lot: [id number]   [place]: [id number]

## 2019-08-07 NOTE — Telephone Encounter (Signed)
Palliative care referral faxed to [facility].  FL2 completed for Dr Joya Gaskins  Call placed to [facility], [place] and informed her of patient's need for more stable residence  with supportive services for supervision and assistance with ADLs such as ALF.  She said that she would start checking availability at local facilities.    This CM shared this information with Edwyna Shell, LCSW/oncology.

## 2019-08-07 NOTE — Progress Notes (Signed)
Patient ID: [name], male   DOB: December 15, 1955, 64 y.o.   MRN: [id number] This patient is seen today in the [place] shelter clinic for evaluation.  Patient has stage IV B-cell lymphoma and has been under chemotherapy since February for same.  He was living in an apartment but lost his apartment then went into the hotel for housing but is becoming increasingly weak and was having frequent falls resulting in the requirement for him to come to the shelter 3 days ago.  The patient has been on the R-CHOP protocol first chemo was in February he has been on is every 3 weeks his next treatment is May 18 at the cancer center.  The patient states he has total pain throughout all his body and he is cold all day long.  Prior history of heart failure and stroke.  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  Also history of bypass surgery.  Patient comes in needing refills on his allopurinol and Compazine.  And also needing refills on the Percocet  Apparently he was trying to get into some type of assisted living and the caseworker at the cancer center was working on this but they could not determine his Medicaid status nor did he have a primary care physician assigned for the FL 2  On exam this is a thin African-American male weighs approximately 110 pounds.  Blood pressure 112/72 saturation 98% room air pulse 88 chest show distant breath sounds cardiac exam showed a regular rate rhythm abdomen was soft extremities showed no edema this patient is extremely weak using a walker to get about.  My impression is the patient has stage IV B-cell lymphoma undergoing chemotherapy and would benefit from more support that he can see in the homeless shelter  The plan would be for the patient to have an FL 2 form filled out and we will search through case management for an assisted living center for him to stay we will coordinate this with his niece and other family members  I refilled his medications today  through [facility] and they will be delivering them to the shelter  He is to keep his follow-up appointments with oncology and I told the patient we would refer him to palliative care medicine for additional support and he appreciated that referral and was in agreement

## 2019-08-07 NOTE — Progress Notes (Signed)
[place] CSW Progress Notes  Call from [place] liaison to shelters in [place].  Patient is currently at [facility] and not at the hotel.  Physician at shelter feels he is appropriate for high level of care such as ALF.  CSW contacted DSS caseworker, Governor Rooks, to alert her that [place] may be required for placement.  She has not returned my call.  CSW called ALFs in [place] that accept Special Assistance funding for placement - of those who answered their phones, [facility] [phone]) and [facility] [phone]) indicated they had a male bed and would like to review the FL2.  Caprice Beaver advised that physician at [facility] can send [facility] if patient consents and these facilities will review.  CSW has also spoken w niece Babatunde Seago - she is the family member who is involved w patient's care.  She is aware he is in shelter and would like him placed in ALF or similar.  Sent her these two facility names and asked that she provide her feedback.  She states she plans to come to next oncologist visit but would like patient to ride [place] advised that they will need to confirm patient's address prior to transport as it is unclear where he will be residing going forward.   Edwyna Shell, LCSW Clinical Social Worker Phone:  [phone] Cell:  [phone]

## 2019-08-07 NOTE — Telephone Encounter (Signed)
COVID Vaccine- Per Dr Julien Nordmann I instructed [name] to have pt wait one week  to take the vaccine.

## 2019-08-08 ENCOUNTER — Telehealth: Payer: Self-pay | Admitting: General Practice

## 2019-08-08 ENCOUNTER — Telehealth: Payer: Self-pay | Admitting: Internal Medicine

## 2019-08-08 ENCOUNTER — Other Ambulatory Visit: Payer: Self-pay | Admitting: Critical Care Medicine

## 2019-08-08 ENCOUNTER — Telehealth: Payer: Self-pay | Admitting: Critical Care Medicine

## 2019-08-08 ENCOUNTER — Telehealth: Payer: Self-pay | Admitting: Medical Oncology

## 2019-08-08 ENCOUNTER — Encounter: Payer: Self-pay | Admitting: General Practice

## 2019-08-08 DIAGNOSIS — Z111 Encounter for screening for respiratory tuberculosis: Secondary | ICD-10-CM

## 2019-08-08 DIAGNOSIS — Z1152 Encounter for screening for COVID-19: Secondary | ICD-10-CM

## 2019-08-08 NOTE — Telephone Encounter (Signed)
TB test information given to [place] to call first for appt and  [facility] to [facility]. I told her to tell the nurse that he needs it before skilled placement and that we don't know when his last one was.

## 2019-08-08 NOTE — Telephone Encounter (Signed)
Scheduled [facility] Palliative visit for 08-14-19 at 10:00.

## 2019-08-08 NOTE — Telephone Encounter (Signed)
-----   Message from Beverely Pace, LCSW sent at 08/08/2019 10:26 AM EDT ----- Have sent his FL2 to the only facilities w male beds taking Special Assistance MCD in [place] - if placement is found, he will need a Tb skin test prior to admission.  I do not believe this is something that can be done at the [facility].  Will [facility] be his PCP going forward?  His assigned PCP for MCD is [facility], but we did contact them weeks ago - as he has not been seen there is several years, he would be considered a new patient and would need to go through their new patient procedure.  We cannot get him scheduled there - it had to be patient/family - niece was given information on how to do this, I dont think she was able to get him an appt.  Tx, Edwyna Shell

## 2019-08-08 NOTE — Telephone Encounter (Signed)
[place] CSW Progress Notes  Call from [facility], [facility].  Facility is willing to offer a bed to patient - require a Tb test and COVID test completed prior to admission.  If Special Assistance M[place]icaid application is initiated prior to admission, they will follow up w this.  Message sent to Dr Joya Gaskins and Eden Lathe [facility]) as well as VM left for Ander Slade, CM at [facility].  Edwyna Shell, LCSW Clinical Social Worker Phone:  [phone] Cell:  [phone]

## 2019-08-08 NOTE — Telephone Encounter (Signed)
Yes , we will be pcp.   We need to arrange for him to come to the clinic to have PPD placed.  Opal Sidles: can you work with Nelson Chimes next week to get this done?     I have put PPD order in place,  I have copied Remus Blake and my nurse [phone]

## 2019-08-08 NOTE — Progress Notes (Signed)
covid test

## 2019-08-08 NOTE — Congregational Nurse Program (Signed)
Assisted with refill of medication and having it delivered to [facility]. Provided Ensure for additional nutrition.

## 2019-08-08 NOTE — Progress Notes (Signed)
[place] CSW Progress Notes  Received call from Jacques Navy, [place] liaison for care for unsheltered individuals, on 08/07/19.  States patient is currently at [facility], has been seen by Dr Joya Gaskins.  MD requesting help w placement for patient into ALF so similar as patient is not appropriate for shelter situation.  Per notes, CSW has provided information to niece (only family member currently noted to be able to assist patient) on placement process.  Patient is known to have both full Medicaid and disability income - his DSS Medicaid caseworker is Governor Rooks.  She has confirmed that patient's assigned PCP is [facility] and she is aware that he needs a copy of his card.  CSW has left VMs w Ms Jeanell Sparrow requesting that she provide this to patient - her contact information was also given to V Raschke (niece).  Niece has called caseworker and requested return call, she has not heard from caseworker as of yesterday.  CSW again left VM for Ms Jeanell Sparrow, informing her that MD is requesting higher level of care for patient which will require Special Assistance Medicaid.  Requested call back, none received to date.  Dr Joya Gaskins saw patient at shelter yesterday, completed FL2, asked that CSW attempt to find placement for patient.  In light of fact that patient is in shelter and unable to assist himself and given that he has no local family support, CSW faxed FL2 to the following ALFs:  [facility] [phone]), [place] [facility] [phone]), [facility] [phone]).  After calling all the ALFs in [place] who were listed as able to take patients w Special Assistance Medicaid, these were the only facilities w male beds.  CSW will follow up w facilities to determine their decision re bed offers.  Of note, patient will need a Tb skin test prior to admission - Dr Joya Gaskins informed of this need.  Edwyna Shell, LCSW Clinical Social Worker Phone:  [phone] Cell:   [phone]

## 2019-08-08 NOTE — Telephone Encounter (Signed)
  COVID vaccine was done yesterday . I told her to keep pt appt next week.     TB test will be coordinated by [facility]. I told Verdene Lennert that [place]  does not need to go to HD

## 2019-08-08 NOTE — Telephone Encounter (Signed)
[place] CSW Progress Notes  Call to niece [name] w update on placement process.  Advised that [facility] has offered bed, [facility], [facility], and [facility] are considering his application.  He will need Tb and COVID tests prior to admission.  Family/patient will need to initiate application for Special Assistance Medicaid w [place] prior to admission.  Advised that [place], [place] at [facility] is aware of all of this and is assisting as well.    Edwyna Shell, LCSW Clinical Social Worker Phone:  [phone] Cell:  [phone]

## 2019-08-11 ENCOUNTER — Other Ambulatory Visit: Payer: Self-pay | Admitting: Critical Care Medicine

## 2019-08-11 ENCOUNTER — Other Ambulatory Visit: Payer: Self-pay

## 2019-08-11 ENCOUNTER — Telehealth: Payer: Self-pay

## 2019-08-11 ENCOUNTER — Encounter: Payer: Self-pay | Admitting: *Deleted

## 2019-08-11 ENCOUNTER — Telehealth (HOSPITAL_BASED_OUTPATIENT_CLINIC_OR_DEPARTMENT_OTHER): Payer: Medicaid Other

## 2019-08-11 DIAGNOSIS — Z111 Encounter for screening for respiratory tuberculosis: Secondary | ICD-10-CM

## 2019-08-11 NOTE — Telephone Encounter (Signed)
DOB and Name verified PPD Placement note As per MD Joya Gaskins order PPD was placed in L arm   placement of PPD test to patient at [facility] Reason for PPD test: annual screening for facility placement  Pt taken PPD test before: yes Verified in allergy area and with patient that they are not allergic to the products PPD is made of (Phenol or Tween).  None verbalized Denies any previous positive PPD test / Injection Well tolerated.Patient advised to return for reading within 48-72 hours. Verbalized understanding

## 2019-08-11 NOTE — Telephone Encounter (Signed)
Call placed to DSS, spoke to Ms Byrd/Special Assistance/Adult medicaid. She stated that she would send this CM an application and noted that it can take up to 45 days to review/approve the application.  The SA application was then sent to [facility] to complete with the patient.  This CM spoke to [place], [facility]. She explained that she needed the [facility] completed with the medications listed on the form, not as an attachment. She also needed Dr Bettina Gavia NPI number. The FL2 was updated and faxed to [facility] as requested. Fax # [phone].  Blondie also noted that a COVID test is needed as well as a TB test.  She said that after the results of the tests are received, the patient could be placed as early as the following day. Blondie also stated that the [facility] does not have to be completed prior to placement, she will work on completing that for the patient.   This CM spoke to [facility], LCSW.  She arranged COVID testing for the patient tomorrow at the cancer center when he is there for his treatment. The cancer clinic will also be providing transportation for the patient to his treatments once placed in the facility.  Vernie Shanks has discussed the transportation arrangements with [facility] as well as the information that is needed prior to placement.  They have a bed available for the patient.    Call placed to [facility], [facility] and provided her with an update of placement status at [facility].  She explained that she has spoken to the patient and he is in agreement to placement and understands that he will need to relinquish his SSI check to the facility. Informed her that the [facility] triage nurse would come to [facility] to place PPD.   Terrance Mass, RN placed PPD this afternoon

## 2019-08-11 NOTE — Progress Notes (Signed)
[place] Clinical Social Work  Holiday representative contacted Eden Lathe (RNCM at [facility]) and [employer] (CM at [facility]) to follow up on ALF process.  Patient is aware and willing of currently bed offers.  CSW contacted Joycelyn Schmid with admissions at [facility], and they have made a bed offer to patient pending TB and covid test.  Ms. Kenton Kingfisher stated med list was "attached" to [place], but can only accept FL2 if meds are written on the form.  Ms. Kenton Kingfisher also stated she could start the medicaid special assistance application once patient was a resident.  CSW contacted RNCM and left a message with updated information/request.    Johnnye Lana, MSW, LCSW, OSW-C Clinical Social Worker [facility] [phone]

## 2019-08-12 ENCOUNTER — Inpatient Hospital Stay (HOSPITAL_BASED_OUTPATIENT_CLINIC_OR_DEPARTMENT_OTHER): Payer: Medicaid Other | Admitting: Internal Medicine

## 2019-08-12 ENCOUNTER — Inpatient Hospital Stay: Payer: Medicaid Other

## 2019-08-12 ENCOUNTER — Other Ambulatory Visit: Payer: Self-pay

## 2019-08-12 ENCOUNTER — Encounter: Payer: Self-pay | Admitting: Internal Medicine

## 2019-08-12 ENCOUNTER — Inpatient Hospital Stay: Payer: Medicaid Other | Admitting: Medical

## 2019-08-12 ENCOUNTER — Inpatient Hospital Stay (HOSPITAL_BASED_OUTPATIENT_CLINIC_OR_DEPARTMENT_OTHER): Payer: Medicaid Other | Admitting: Medical

## 2019-08-12 VITALS — BP 96/70 | HR 88 | Temp 98.2°F | Resp 17 | Ht 67.0 in | Wt 120.0 lb

## 2019-08-12 VITALS — BP 92/66 | HR 74 | Temp 97.9°F | Resp 17

## 2019-08-12 DIAGNOSIS — C8338 Diffuse large B-cell lymphoma, lymph nodes of multiple sites: Secondary | ICD-10-CM

## 2019-08-12 DIAGNOSIS — Z95828 Presence of other vascular implants and grafts: Secondary | ICD-10-CM

## 2019-08-12 DIAGNOSIS — Z5111 Encounter for antineoplastic chemotherapy: Secondary | ICD-10-CM

## 2019-08-12 DIAGNOSIS — I10 Essential (primary) hypertension: Secondary | ICD-10-CM | POA: Diagnosis not present

## 2019-08-12 DIAGNOSIS — Z5112 Encounter for antineoplastic immunotherapy: Secondary | ICD-10-CM | POA: Diagnosis not present

## 2019-08-12 LAB — CBC WITH DIFFERENTIAL (CANCER CENTER ONLY)
Abs Immature Granulocytes: 0.01 10*3/uL (ref 0.00–0.07)
Basophils Absolute: 0.1 10*3/uL (ref 0.0–0.1)
Basophils Relative: 1 %
Eosinophils Absolute: 0.1 10*3/uL (ref 0.0–0.5)
Eosinophils Relative: 1 %
HCT: 39.2 % (ref 39.0–52.0)
Hemoglobin: 13 g/dL (ref 13.0–17.0)
Immature Granulocytes: 0 %
Lymphocytes Relative: 13 %
Lymphs Abs: 0.7 10*3/uL (ref 0.7–4.0)
MCH: 31.5 pg (ref 26.0–34.0)
MCHC: 33.2 g/dL (ref 30.0–36.0)
MCV: 94.9 fL (ref 80.0–100.0)
Monocytes Absolute: 0.6 10*3/uL (ref 0.1–1.0)
Monocytes Relative: 11 %
Neutro Abs: 3.8 10*3/uL (ref 1.7–7.7)
Neutrophils Relative %: 74 %
Platelet Count: 279 10*3/uL (ref 150–400)
RBC: 4.13 MIL/uL — ABNORMAL LOW (ref 4.22–5.81)
RDW: 17.1 % — ABNORMAL HIGH (ref 11.5–15.5)
WBC Count: 5.2 10*3/uL (ref 4.0–10.5)
nRBC: 0 % (ref 0.0–0.2)

## 2019-08-12 LAB — CMP (CANCER CENTER ONLY)
ALT: 16 U/L (ref 0–44)
AST: 20 U/L (ref 15–41)
Albumin: 3.6 g/dL (ref 3.5–5.0)
Alkaline Phosphatase: 77 U/L (ref 38–126)
Anion gap: 12 (ref 5–15)
BUN: 18 mg/dL (ref 8–23)
CO2: 24 mmol/L (ref 22–32)
Calcium: 9.3 mg/dL (ref 8.9–10.3)
Chloride: 106 mmol/L (ref 98–111)
Creatinine: 0.87 mg/dL (ref 0.61–1.24)
GFR, Est AFR Am: >60 mL/min (ref >60)
GFR, Estimated: >60 mL/min (ref >60)
Glucose, Bld: 126 mg/dL — ABNORMAL HIGH (ref 70–99)
Potassium: 3.7 mmol/L (ref 3.5–5.1)
Sodium: 142 mmol/L (ref 135–145)
Total Bilirubin: 0.3 mg/dL (ref 0.3–1.2)
Total Protein: 6.8 g/dL (ref 6.5–8.1)

## 2019-08-12 LAB — SARS CORONAVIRUS 2 BY RT PCR (HOSPITAL ORDER, PERFORMED IN ~~LOC~~ HOSPITAL LAB): SARS Coronavirus 2: NEGATIVE

## 2019-08-12 MED ORDER — PALONOSETRON HCL INJECTION 0.25 MG/5ML
INTRAVENOUS | Status: AC
  Filled 2019-08-12: qty 5

## 2019-08-12 MED ORDER — PALONOSETRON HCL INJECTION 0.25 MG/5ML
0.2500 mg | Freq: Once | INTRAVENOUS | Status: AC
  Administered 2019-08-12: 0.25 mg via INTRAVENOUS

## 2019-08-12 MED ORDER — SODIUM CHLORIDE 0.9% FLUSH
10.0000 mL | INTRAVENOUS | Status: DC | PRN
  Administered 2019-08-12: 10 mL
  Filled 2019-08-12: qty 10

## 2019-08-12 MED ORDER — DIPHENHYDRAMINE HCL 25 MG PO CAPS
ORAL_CAPSULE | ORAL | Status: AC
  Filled 2019-08-12: qty 1

## 2019-08-12 MED ORDER — SODIUM CHLORIDE 0.9 % IV SOLN
Freq: Once | INTRAVENOUS | Status: AC
  Filled 2019-08-12: qty 250

## 2019-08-12 MED ORDER — SODIUM CHLORIDE 0.9% FLUSH
10.0000 mL | INTRAVENOUS | Status: DC | PRN
  Administered 2019-08-12: 10 mL via INTRAVENOUS
  Filled 2019-08-12: qty 10

## 2019-08-12 MED ORDER — DOXORUBICIN HCL CHEMO IV INJECTION 2 MG/ML
25.0000 mg/m2 | Freq: Once | INTRAVENOUS | Status: AC
  Administered 2019-08-12: 38 mg via INTRAVENOUS
  Filled 2019-08-12: qty 19

## 2019-08-12 MED ORDER — HEPARIN SOD (PORK) LOCK FLUSH 100 UNIT/ML IV SOLN
500.0000 [IU] | Freq: Once | INTRAVENOUS | Status: AC | PRN
  Administered 2019-08-12: 500 [IU]
  Filled 2019-08-12: qty 5

## 2019-08-12 MED ORDER — SODIUM CHLORIDE 0.9 % IV SOLN
150.0000 mg | Freq: Once | INTRAVENOUS | Status: AC
  Administered 2019-08-12: 150 mg via INTRAVENOUS
  Filled 2019-08-12: qty 150

## 2019-08-12 MED ORDER — SODIUM CHLORIDE 0.9 % IV SOLN
750.0000 mg/m2 | Freq: Once | INTRAVENOUS | Status: AC
  Administered 2019-08-12: 1120 mg via INTRAVENOUS
  Filled 2019-08-12: qty 56

## 2019-08-12 MED ORDER — SODIUM CHLORIDE 0.9 % IV SOLN
10.0000 mg | Freq: Once | INTRAVENOUS | Status: AC
  Administered 2019-08-12: 10 mg via INTRAVENOUS
  Filled 2019-08-12: qty 10

## 2019-08-12 MED ORDER — ACETAMINOPHEN 325 MG PO TABS
ORAL_TABLET | ORAL | Status: AC
  Filled 2019-08-12: qty 2

## 2019-08-12 MED ORDER — ACETAMINOPHEN 325 MG PO TABS
650.0000 mg | ORAL_TABLET | Freq: Once | ORAL | Status: AC
  Administered 2019-08-12: 650 mg via ORAL

## 2019-08-12 MED ORDER — SODIUM CHLORIDE 0.9 % IV SOLN
375.0000 mg/m2 | Freq: Once | INTRAVENOUS | Status: AC
  Administered 2019-08-12: 600 mg via INTRAVENOUS
  Filled 2019-08-12: qty 50

## 2019-08-12 MED ORDER — DIPHENHYDRAMINE HCL 25 MG PO CAPS
50.0000 mg | ORAL_CAPSULE | Freq: Once | ORAL | Status: AC
  Administered 2019-08-12: 50 mg via ORAL

## 2019-08-12 MED ORDER — VINCRISTINE SULFATE CHEMO INJECTION 1 MG/ML
2.0000 mg | Freq: Once | INTRAVENOUS | Status: AC
  Administered 2019-08-12: 2 mg via INTRAVENOUS
  Filled 2019-08-12: qty 2

## 2019-08-12 NOTE — Progress Notes (Signed)
FYI, COVID-19 negative  Lucianne Lei

## 2019-08-12 NOTE — Patient Instructions (Signed)

## 2019-08-12 NOTE — Addendum Note (Signed)
Addended by: Caryl Comes E on: 08/12/2019 02:15 PM   Modules accepted: Orders

## 2019-08-12 NOTE — Progress Notes (Signed)
This patient was seen in infusion today with a COVID-19 sample collected.  Sandi Mealy, MHS, PA-C Physician Assistant

## 2019-08-12 NOTE — Progress Notes (Signed)
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    PRIOR THERAPY: None.  CURRENT THERAPY: Systemic chemotherapy with R-CHOP every 3 weeks.  First dose May 20, 2019.  Status post 4 cycles.  INTERVAL HISTORY: [name] 64 y.o. male returns to the clinic today for follow-up visit accompanied by his niece.  The patient is feeling fine today with no concerning complaints.  He has been tolerating his systemic chemotherapy with R-CHOP fairly well.  He denied having any current chest pain, shortness of breath, cough or hemoptysis.  He denied having any fever or chills.  He has no nausea, vomiting, diarrhea or constipation.  He has no headache or visual changes.  The patient has no significant weight loss or night sweats.  He is here today for evaluation before starting cycle #5 of his treatment.  MEDICAL HISTORY: Past Medical History:  Diagnosis Date  . CAD (coronary artery disease)    a. 04/2008 Cath: LM nl, LAD 50p, 26m, LCX min irregs, RCA 50p/m, 40d, RPDA 60-70ost; b. 04/2008 CABG x 4: LIMA->LAD, VG->D2, VG->RPDA->RPL.  . Cancer ([place])   . Cocaine abuse ([place])    a. Quit early 2019.  . Ischemic cardiomyopathy    a. LV gram: EF 30-35%, apical/periapical AK.  . Stroke ([place]) 02/2019  . Stroke ([place]) 02/2019  . Tobacco abuse     ALLERGIES:  has No Known Allergies.  MEDICATIONS:  Current Outpatient Medications  Medication Sig Dispense Refill  . acetaminophen (TYLENOL) 325  MG tablet Take 2 tablets (650 mg total) by mouth every 4 (four) hours as needed for mild pain (or temp > 37.5 C (99.5 F)).    Marland Kitchen allopurinol (ZYLOPRIM) 100 MG tablet Take 1 tablet (100 mg total) by mouth 2 (two) times daily. 60 tablet 2  . aspirin EC 81 MG tablet Take 1 tablet (81 mg total) by mouth daily. 30 tablet 3  . docusate sodium (COLACE) 100 MG capsule Take 1 capsule (100 mg total) by mouth every 12 (twelve) hours. 60 capsule 0  . lidocaine-prilocaine (EMLA) cream Apply 1 application topically as needed. Apply a teaspoon amount to port site an hour prior to chemo therapy, DO NOT rub in, cover with plastic wrap. 30 g 0  . oxyCODONE-acetaminophen (PERCOCET/ROXICET) 5-325 MG tablet Take 1 tablet by mouth every 6 (six) hours as needed for severe pain. 30 tablet 0  . polyethylene glycol (MIRALAX / GLYCOLAX) 17 g packet Take 17 g by mouth daily. 14 each 0  . predniSONE (DELTASONE) 50 MG tablet 2 tablet p.o. daily for 5 days start with the first day of every chemotherapy cycle. 80 tablet 0  . prochlorperazine (COMPAZINE) 10 MG tablet Take 1 tablet (10 mg total) by mouth every 6 (six) hours as needed for nausea or vomiting. 30 tablet 2   No current facility-administered medications for this visit.    SURGICAL HISTORY:  Past Surgical History:  Procedure Laterality Date  . BYPASS GRAFT  2010  . CARDIAC SURGERY  2010  .  IR IMAGING GUIDED PORT INSERTION  05/26/2019  . SUPRACLAVICAL NODE BIOPSY Left 05/08/2019   Procedure: SUPRACLAVICAL NODE BIOPSY;  Surgeon: [name], MD;  Location: [employer] [facility];  Service: Thoracic;  Laterality: Left;    REVIEW OF SYSTEMS:  A comprehensive review of systems was negative except for: Constitutional: positive for fatigue Musculoskeletal: positive for muscle weakness   PHYSICAL EXAMINATION: General appearance: alert, cooperative, fatigued and no distress Head: Normocephalic, without obvious abnormality, atraumatic Neck: no adenopathy, no JVD, supple,  symmetrical, trachea midline and thyroid not enlarged, symmetric, no tenderness/mass/nodules Lymph nodes: Cervical, supraclavicular, and axillary nodes normal. Resp: clear to auscultation bilaterally Back: symmetric, no curvature. ROM normal. No CVA tenderness. Cardio: regular rate and rhythm, S1, S2 normal, no murmur, click, rub or gallop GI: soft, non-tender; bowel sounds normal; no masses,  no organomegaly Extremities: extremities normal, atraumatic, no cyanosis or edema  ECOG PERFORMANCE STATUS: 1 - Symptomatic but completely ambulatory  Blood pressure 96/70, pulse 88, temperature 98.2 F (36.8 C), temperature source Temporal, resp. rate 17, height 5\' 7"  (1.702 m), weight 120 lb (54.4 kg), SpO2 99 %.  LABORATORY DATA: Lab Results  Component Value Date   WBC 5.2 08/12/2019   HGB 13.0 08/12/2019   HCT 39.2 08/12/2019   MCV 94.9 08/12/2019   PLT 279 08/12/2019      Chemistry      Component Value Date/Time   NA 142 08/12/2019 0817   K 3.7 08/12/2019 0817   CL 106 08/12/2019 0817   CO2 24 08/12/2019 0817   BUN 18 08/12/2019 0817   CREATININE 0.87 08/12/2019 0817      Component Value Date/Time   CALCIUM 9.3 08/12/2019 0817   ALKPHOS 77 08/12/2019 0817   AST 20 08/12/2019 0817   ALT 16 08/12/2019 0817   BILITOT 0.3 08/12/2019 0817       RADIOGRAPHIC STUDIES: CT Chest W Contrast  Result Date: 07/17/2019 CLINICAL DATA:  Restaging of diffuse large B-cell lymphoma. Ongoing chemotherapy. EXAM: CT CHEST, ABDOMEN, AND PELVIS WITH CONTRAST TECHNIQUE: Multidetector CT imaging of the chest, abdomen and pelvis was performed following the standard protocol during bolus administration of intravenous contrast. CONTRAST:  124mL OMNIPAQUE IOHEXOL 300 MG/ML  SOLN COMPARISON:  PET-CT from 04/18/2019 FINDINGS: CT CHEST FINDINGS Cardiovascular: Right Port-A-Cath tip: SVC. Prior CABG. Coronary atherosclerosis. Mild cardiomegaly. Mediastinum/Nodes: Interval resection of the left supraclavicular  lymph node shown on prior PET-CT. Pleuropericardial nodularity along the right heart border has resolved. Lungs/Pleura: Centrilobular emphysema. Marked reduction in the bulky peribronchovascular masslike appearance shown on the prior PET-CT. There is continued atelectasis centrally in the right middle lobe but the confluent mass measuring 8.5 by 6.2 cm on the prior PET-CT has nearly completely resolved. The atelectatic region primarily involving the right middle lobe measures about 4.8 by 3.2 cm on image 83/4 and could have some retained underlying nodular component, but the appearance is markedly improved. 0.5 by 0.7 cm right upper lobe nodule on image 36/4, previously about 0.5 by 0.5 cm, surveillance suggested. There is some localized pleural thickening along the right lung base which seems to be fatty density and probably benign. Reduced atelectasis in the posterior basal segment left lower lobe. Improved definition of small left upper lobe subpleural nodules including a 0.5 by 0.6 cm left upper lobe nodule on image 62/4, but these do not appear increased in size. A separate left upper lobe nodule measuring 0.7 by 0.4 cm on image 72/4 is stable. Musculoskeletal: Prior median sternotomy. Mild thoracic spondylosis. CT  ABDOMEN PELVIS FINDINGS Hepatobiliary: Unremarkable Pancreas: Mildly prominent dorsal pancreatic duct. Spleen: Heterogeneous/geographic low density in the spleen. The spleen measures 9.0 by 6.6 by 8.0 cm (volume = 250 cm^3). Previously the spleen was much larger and had extensive tumor, measuring 12.8 by 10.3 by 14.5 cm (volume = 1000 cm^3). Current hypodensities likely reflect residua from effectively treated tumor. Adrenals/Urinary Tract: Mild scarring in the left kidney upper pole. Adrenal glands unremarkable. The urinary bladder appears normal. Stomach/Bowel: Unremarkable Vascular/Lymphatic: The extensive retrocrural, right gastric,, periaortic, left retroperitoneal, and pelvic adenopathy is  markedly improved and in many places resolved. Index left inguinal node 1.0 cm in short axis on image 113/2, previously 2.4 cm by my measurements. There is smooth an ill-defined soft tissue stranding at locations previously occupied by bulky tumor particularly in the retroperitoneum and retrocrural region. The tumor nodules along the left posterior perirenal fascia have resolved. Reproductive: Unremarkable Other: No supplemental non-categorized findings. Musculoskeletal: Prominent and asymmetric degenerative right hip arthropathy. Lower lumbar spondylosis and degenerative disc disease causing bilateral foraminal impingement at L5-S1. Deformity of the right femoral neck likely from an old fracture. IMPRESSION: 1. Prominent reduction in size of the bulky tumor in the chest, abdomen, and pelvis, with resolution of the left supraclavicular adenopathy, resolution of the pleuropericardial nodularity along the right heart border, marked reduction in the right infrahilar tumor, and near complete resolution of the bulky tumor in the retroperitoneum and retrocrural region. There continues to be atelectasis centrally in the right middle lobe, but the atelectatic region primarily involving the right middle lobe is markedly improved. 2. Several small pulmonary nodules are present, one of which is mildly more conspicuous compared to the prior PET-CT, meriting surveillance. 3. Other imaging findings of potential clinical significance: Mild cardiomegaly. Prior CABG. Mildly prominent dorsal pancreatic duct, nonspecific. Prominent and asymmetric degenerative right hip arthropathy associated with chronic femoral neck deformity. Lower lumbar spondylosis and degenerative disc disease causing bilateral foraminal impingement at L5-S1. Mildly prominent dorsal pancreatic duct. 4. Emphysema and aortic atherosclerosis. Aortic Atherosclerosis (ICD10-I70.0) and Emphysema (ICD10-J43.9). Electronically Signed   By: Van Clines M.D.   On:  07/17/2019 13:29   CT Abdomen Pelvis W Contrast  Result Date: 07/17/2019 : The complete report for the CT of the chest, abdomen, and pelvis is available under accession number [id number] [facility]. Electronically Signed   By: Van Clines M.D.   On: 07/17/2019 13:33    ASSESSMENT AND PLAN: This is a 64 years old African-American male recently diagnosed with 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  The patient is currently undergoing systemic chemotherapy with R-CHOP status post 4 cycles.  The patient continues to tolerate his treatment well with no concerning adverse effects. I recommended for him to proceed with cycle #5 today as planned. He will come back for follow-up visit in 3 weeks for evaluation before the next cycle of his treatment. The patient was advised to call immediately if he has any concerning symptoms in the interval. The patient voices understanding of current disease status and treatment options and is in agreement with the current care plan.  All questions were answered. The patient knows to call the clinic with any problems, questions or concerns. We can certainly see the patient much sooner if necessary.   Disclaimer: This note was dictated with voice recognition software.  Similar sounding words can inadvertently be transcribed and may not be corrected upon review.

## 2019-08-12 NOTE — Patient Instructions (Signed)
[facility] Discharge Instructions for Patients Receiving Chemotherapy  Today you received the following chemotherapy agents: Adriamycin/Vincristine/Cytoxan/Rituxan.  To help prevent nausea and vomiting after your treatment, we encourage you to take your nausea medication as directed.   If you develop nausea and vomiting that is not controlled by your nausea medication, call the clinic.   BELOW ARE SYMPTOMS THAT SHOULD BE REPORTED IMMEDIATELY:  *FEVER GREATER THAN 100.5 F  *CHILLS WITH OR WITHOUT FEVER  NAUSEA AND VOMITING THAT IS NOT CONTROLLED WITH YOUR NAUSEA MEDICATION  *UNUSUAL SHORTNESS OF BREATH  *UNUSUAL BRUISING OR BLEEDING  TENDERNESS IN MOUTH AND THROAT WITH OR WITHOUT PRESENCE OF ULCERS  *URINARY PROBLEMS  *BOWEL PROBLEMS  UNUSUAL RASH Items with * indicate a potential emergency and should be followed up as soon as possible.  Feel free to call the clinic should you have any questions or concerns. The clinic phone number is [phone].  Please show the [facility] at check-in to the Emergency Department and triage nurse.

## 2019-08-13 ENCOUNTER — Telehealth: Payer: Self-pay | Admitting: Internal Medicine

## 2019-08-13 ENCOUNTER — Encounter: Payer: Self-pay | Admitting: Critical Care Medicine

## 2019-08-13 NOTE — Progress Notes (Signed)
Patient ID: [name], male   DOB: [birth year], 64 y.o.   MRN: [id number]  This patient is seen in the [facility] clinic in follow-up today and now is agreed to placement in an assisted living center and a bed has been identified.  Note his Covid test was negative.  His PPD today is read as negative.  The patient is undergoing chemotherapy for stage IV large cell lymphoma.  He just underwent chemotherapy this week.  CURRENT THERAPY: Systemic chemotherapy with R-CHOP every 3 weeks.  First dose May 20, 2019.  Status post 4 cycles.  diagnosed with 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  The patient is currently undergoing systemic chemotherapy with R-CHOP status post 5 cycles.    The patient underwent his fifth cycle yesterday on the 18th and is tolerated this well.  We have been giving him nutritional supplements and he tolerates these as well and is much stronger.  Impression is that of stage IV large B cell non-Hodgkin's lymphoma diagnosed February 2021 with hypermetabolic lymphadenopathy in the abdomen and pelvis and bulky right perihilar mass and left supraclavicular adenopathy.  Also significant densely in the spleen.  Plan is for the patient be placed into assisted living this week for further supportive care and he will continue his chemotherapy and follow-up per oncology  No change in his medications as of today

## 2019-08-13 NOTE — Telephone Encounter (Signed)
Scheduled per los. Called and left msg. Mailed printout  °

## 2019-08-14 ENCOUNTER — Other Ambulatory Visit: Payer: Medicaid Other | Admitting: Internal Medicine

## 2019-08-14 ENCOUNTER — Other Ambulatory Visit: Payer: Self-pay

## 2019-08-14 ENCOUNTER — Inpatient Hospital Stay: Payer: Medicaid Other

## 2019-08-14 ENCOUNTER — Telehealth: Payer: Self-pay | Admitting: General Practice

## 2019-08-14 ENCOUNTER — Telehealth: Payer: Self-pay

## 2019-08-14 VITALS — BP 100/62 | Temp 98.6°F | Resp 18

## 2019-08-14 DIAGNOSIS — Z5112 Encounter for antineoplastic immunotherapy: Secondary | ICD-10-CM | POA: Diagnosis not present

## 2019-08-14 DIAGNOSIS — C8338 Diffuse large B-cell lymphoma, lymph nodes of multiple sites: Secondary | ICD-10-CM

## 2019-08-14 LAB — TB SKIN TEST
Induration: 0 mm
TB Skin Test: NEGATIVE

## 2019-08-14 MED ORDER — PEGFILGRASTIM-JMDB 6 MG/0.6ML ~~LOC~~ SOSY
PREFILLED_SYRINGE | SUBCUTANEOUS | Status: AC
  Filled 2019-08-14: qty 0.6

## 2019-08-14 MED ORDER — PEGFILGRASTIM-JMDB 6 MG/0.6ML ~~LOC~~ SOSY
6.0000 mg | PREFILLED_SYRINGE | Freq: Once | SUBCUTANEOUS | Status: AC
  Administered 2019-08-14: 6 mg via SUBCUTANEOUS

## 2019-08-14 NOTE — Telephone Encounter (Signed)
This CM spoke to [employer] # [phone]. she confirmed that she has the FL2.  Informed her that the COVID and PPD tests were negative.  This CM to fax her the results.  She also confirmed that a bed is still available for the patient. She will contact [facility] to confirm plan to transfer patient to the facility tomorrow.   Call received from [facility] who stated that she spoke to [facility] and the plan is for the patient to be discharged to the facility tomorrow.  Blondie needs to receive the COVID and PPD results by 1400 today.  PPD and COVID results faxed to [facility] - fax # [phone]

## 2019-08-14 NOTE — Telephone Encounter (Signed)
This CM spoke to [facility], [place].  She said that she has spoken to [facility] and the patient's niece needs to transport him to the facility tomorrow. the facility has all the documentation  they need and Glenard Haring provided additional background information.  Glenard Haring will call his niece this afternoon to confirm the plan.

## 2019-08-14 NOTE — Patient Instructions (Signed)

## 2019-08-14 NOTE — Telephone Encounter (Signed)
[facility] CSW Progress Notes  SM to Etter Sjogren, RN CM at [facility], asking if there is anything else needed to assist w placement.  Call to [facility], case manager at shelter.  Provided her w contact information for Joycelyn Schmid, admissions at [facility] [phone]).  RN CM plans to provide copies of COVID and Tb tests to facility, w goal of placement tomorrow.  Per Glenard Haring, she has spoken w niece Verdene Lennert who is in agreement w placement.  Edwyna Shell, LCSW Clinical Social Worker Phone:  [phone] Cell:  [phone]

## 2019-08-15 ENCOUNTER — Encounter: Payer: Self-pay | Admitting: General Practice

## 2019-08-15 ENCOUNTER — Telehealth: Payer: Self-pay

## 2019-08-15 NOTE — Progress Notes (Signed)
[facility] CSW Progress Notes  Patient admitted to [facility] ([address] [place], [place] [address]).  Adminstrator is [facility] [phone]).  Please fax schedule to facility so they are aware of any upcoming appointments - fax ifs [phone].  VM left for Dr Ellan Lambert desk nurse.  Edwyna Shell, LCSW Clinical Social Worker Phone:  [phone] Cell:  [phone]

## 2019-08-15 NOTE — Telephone Encounter (Signed)
Appointment calendar faxed to [facility] [phone]). Confirmation of fax receipt received.

## 2019-08-19 ENCOUNTER — Other Ambulatory Visit: Payer: Self-pay

## 2019-08-19 ENCOUNTER — Inpatient Hospital Stay: Payer: Medicaid Other

## 2019-08-19 DIAGNOSIS — Z5112 Encounter for antineoplastic immunotherapy: Secondary | ICD-10-CM | POA: Diagnosis not present

## 2019-08-19 DIAGNOSIS — Z95828 Presence of other vascular implants and grafts: Secondary | ICD-10-CM

## 2019-08-19 DIAGNOSIS — C8338 Diffuse large B-cell lymphoma, lymph nodes of multiple sites: Secondary | ICD-10-CM

## 2019-08-19 LAB — CBC WITH DIFFERENTIAL (CANCER CENTER ONLY)
Abs Immature Granulocytes: 0.41 10*3/uL — ABNORMAL HIGH (ref 0.00–0.07)
Basophils Absolute: 0.1 10*3/uL (ref 0.0–0.1)
Basophils Relative: 1 %
Eosinophils Absolute: 0.1 10*3/uL (ref 0.0–0.5)
Eosinophils Relative: 0 %
HCT: 37.4 % — ABNORMAL LOW (ref 39.0–52.0)
Hemoglobin: 12.2 g/dL — ABNORMAL LOW (ref 13.0–17.0)
Immature Granulocytes: 2 %
Lymphocytes Relative: 3 %
Lymphs Abs: 0.6 10*3/uL — ABNORMAL LOW (ref 0.7–4.0)
MCH: 31.5 pg (ref 26.0–34.0)
MCHC: 32.6 g/dL (ref 30.0–36.0)
MCV: 96.6 fL (ref 80.0–100.0)
Monocytes Absolute: 1.1 10*3/uL — ABNORMAL HIGH (ref 0.1–1.0)
Monocytes Relative: 6 %
Neutro Abs: 15.5 10*3/uL — ABNORMAL HIGH (ref 1.7–7.7)
Neutrophils Relative %: 88 %
Platelet Count: 243 10*3/uL (ref 150–400)
RBC: 3.87 MIL/uL — ABNORMAL LOW (ref 4.22–5.81)
RDW: 16.3 % — ABNORMAL HIGH (ref 11.5–15.5)
WBC Count: 17.8 10*3/uL — ABNORMAL HIGH (ref 4.0–10.5)
nRBC: 0 % (ref 0.0–0.2)

## 2019-08-19 LAB — CMP (CANCER CENTER ONLY)
ALT: 15 U/L (ref 0–44)
AST: 8 U/L — ABNORMAL LOW (ref 15–41)
Albumin: 3.6 g/dL (ref 3.5–5.0)
Alkaline Phosphatase: 126 U/L (ref 38–126)
Anion gap: 6 (ref 5–15)
BUN: 10 mg/dL (ref 8–23)
CO2: 30 mmol/L (ref 22–32)
Calcium: 9.1 mg/dL (ref 8.9–10.3)
Chloride: 104 mmol/L (ref 98–111)
Creatinine: 0.71 mg/dL (ref 0.61–1.24)
GFR, Est AFR Am: >60 mL/min (ref >60)
GFR, Estimated: >60 mL/min (ref >60)
Glucose, Bld: 143 mg/dL — ABNORMAL HIGH (ref 70–99)
Potassium: 3.8 mmol/L (ref 3.5–5.1)
Sodium: 140 mmol/L (ref 135–145)
Total Bilirubin: 0.4 mg/dL (ref 0.3–1.2)
Total Protein: 6.4 g/dL — ABNORMAL LOW (ref 6.5–8.1)

## 2019-08-19 MED ORDER — HEPARIN SOD (PORK) LOCK FLUSH 100 UNIT/ML IV SOLN
500.0000 [IU] | Freq: Once | INTRAVENOUS | Status: AC
  Administered 2019-08-19: 500 [IU] via INTRAVENOUS
  Filled 2019-08-19: qty 5

## 2019-08-19 MED ORDER — SODIUM CHLORIDE 0.9% FLUSH
10.0000 mL | INTRAVENOUS | Status: DC | PRN
  Administered 2019-08-19: 10 mL via INTRAVENOUS
  Filled 2019-08-19: qty 10

## 2019-08-19 NOTE — Patient Instructions (Signed)

## 2019-08-26 ENCOUNTER — Inpatient Hospital Stay: Payer: Medicaid Other | Attending: Internal Medicine

## 2019-08-26 ENCOUNTER — Other Ambulatory Visit: Payer: Self-pay

## 2019-08-26 ENCOUNTER — Ambulatory Visit: Payer: Medicaid Other | Admitting: Adult Health

## 2019-08-26 ENCOUNTER — Inpatient Hospital Stay: Payer: Medicaid Other

## 2019-08-26 ENCOUNTER — Encounter: Payer: Self-pay | Admitting: Adult Health

## 2019-08-26 VITALS — BP 100/64 | HR 83 | Ht 67.0 in | Wt 120.0 lb

## 2019-08-26 DIAGNOSIS — R222 Localized swelling, mass and lump, trunk: Secondary | ICD-10-CM | POA: Diagnosis not present

## 2019-08-26 DIAGNOSIS — C8338 Diffuse large B-cell lymphoma, lymph nodes of multiple sites: Secondary | ICD-10-CM

## 2019-08-26 DIAGNOSIS — J439 Emphysema, unspecified: Secondary | ICD-10-CM | POA: Insufficient documentation

## 2019-08-26 DIAGNOSIS — I63511 Cerebral infarction due to unspecified occlusion or stenosis of right middle cerebral artery: Secondary | ICD-10-CM | POA: Diagnosis not present

## 2019-08-26 DIAGNOSIS — G8194 Hemiplegia, unspecified affecting left nondominant side: Secondary | ICD-10-CM | POA: Diagnosis not present

## 2019-08-26 DIAGNOSIS — C8330 Diffuse large B-cell lymphoma, unspecified site: Secondary | ICD-10-CM | POA: Diagnosis present

## 2019-08-26 DIAGNOSIS — Z5189 Encounter for other specified aftercare: Secondary | ICD-10-CM | POA: Diagnosis not present

## 2019-08-26 DIAGNOSIS — I10 Essential (primary) hypertension: Secondary | ICD-10-CM

## 2019-08-26 DIAGNOSIS — R7303 Prediabetes: Secondary | ICD-10-CM

## 2019-08-26 DIAGNOSIS — G629 Polyneuropathy, unspecified: Secondary | ICD-10-CM | POA: Insufficient documentation

## 2019-08-26 DIAGNOSIS — E782 Mixed hyperlipidemia: Secondary | ICD-10-CM

## 2019-08-26 DIAGNOSIS — Z5112 Encounter for antineoplastic immunotherapy: Secondary | ICD-10-CM | POA: Insufficient documentation

## 2019-08-26 DIAGNOSIS — Z5111 Encounter for antineoplastic chemotherapy: Secondary | ICD-10-CM | POA: Insufficient documentation

## 2019-08-26 LAB — CBC WITH DIFFERENTIAL (CANCER CENTER ONLY)
Abs Immature Granulocytes: 0.24 10*3/uL — ABNORMAL HIGH (ref 0.00–0.07)
Basophils Absolute: 0.1 10*3/uL (ref 0.0–0.1)
Basophils Relative: 1 %
Eosinophils Absolute: 0 10*3/uL (ref 0.0–0.5)
Eosinophils Relative: 0 %
HCT: 39.6 % (ref 39.0–52.0)
Hemoglobin: 13.2 g/dL (ref 13.0–17.0)
Immature Granulocytes: 2 %
Lymphocytes Relative: 7 %
Lymphs Abs: 0.8 10*3/uL (ref 0.7–4.0)
MCH: 31.7 pg (ref 26.0–34.0)
MCHC: 33.3 g/dL (ref 30.0–36.0)
MCV: 95 fL (ref 80.0–100.0)
Monocytes Absolute: 0.8 10*3/uL (ref 0.1–1.0)
Monocytes Relative: 8 %
Neutro Abs: 9.1 10*3/uL — ABNORMAL HIGH (ref 1.7–7.7)
Neutrophils Relative %: 82 %
Platelet Count: 250 10*3/uL (ref 150–400)
RBC: 4.17 MIL/uL — ABNORMAL LOW (ref 4.22–5.81)
RDW: 15.8 % — ABNORMAL HIGH (ref 11.5–15.5)
WBC Count: 11.1 10*3/uL — ABNORMAL HIGH (ref 4.0–10.5)
nRBC: 0 % (ref 0.0–0.2)

## 2019-08-26 LAB — CMP (CANCER CENTER ONLY)
ALT: 17 U/L (ref 0–44)
AST: 12 U/L — ABNORMAL LOW (ref 15–41)
Albumin: 3.7 g/dL (ref 3.5–5.0)
Alkaline Phosphatase: 101 U/L (ref 38–126)
Anion gap: 8 (ref 5–15)
BUN: 15 mg/dL (ref 8–23)
CO2: 25 mmol/L (ref 22–32)
Calcium: 9.4 mg/dL (ref 8.9–10.3)
Chloride: 107 mmol/L (ref 98–111)
Creatinine: 0.8 mg/dL (ref 0.61–1.24)
GFR, Est AFR Am: >60 mL/min (ref >60)
GFR, Estimated: >60 mL/min (ref >60)
Glucose, Bld: 113 mg/dL — ABNORMAL HIGH (ref 70–99)
Potassium: 3.8 mmol/L (ref 3.5–5.1)
Sodium: 140 mmol/L (ref 135–145)
Total Bilirubin: <0.2 mg/dL — ABNORMAL LOW (ref 0.3–1.2)
Total Protein: 6.8 g/dL (ref 6.5–8.1)

## 2019-08-26 LAB — URIC ACID: Uric Acid, Serum: 3.4 mg/dL — ABNORMAL LOW (ref 3.7–8.6)

## 2019-08-26 NOTE — Progress Notes (Signed)
[facility] [address] [place]. [place] [address] [phone]       STROKE FOLLOW UP NOTE  Mr. MONNIE ANSPACH Date of Birth:  1956/01/28 Medical Record Number:  [id number]   Reason for Referral: stroke follow up    CHIEF COMPLAINT:  Chief Complaint  Patient presents with  . Follow-up    rm 9, stroke fu,with niece, pt states he is well and has improved some    HPI:  Today, 08/26/2019, Mr. 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  He is accompanied by his niece.  Residual stroke deficits of left hemiparesis and endorses ongoing improvement.  Ambulating with RW due to legs fatiguing occassionally. After his stroke, his sister became more involved in his care and was present during prior visit but she has unfortunately passed away.  His niece and nephew continue assisting with care needs and as he was previously homeless, he has been admitted to [place]. Per niece, housing authority possibly will be finding him an apartment to live independently in the near future. 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   He continues to follow with oncology closely.  He continues on aspirin 81 mg daily for secondary stroke prevention without side effects.  Blood pressure today 100/64. He has not restarted statin - has appt with cardiology next week and he plans on discussing further at that time. No concerns at this time.     History provided for reference purposes only Initial visit 04/22/2019 JM: Mr. Mcisaac is a 64 year old male who is being seen today for hospital stroke follow-up accompanied by his sister.  Residual stroke deficits of left hemiparesis but does endorse improvement.  He has not started outpatient therapy due to multiple appointments and work-up with oncology.  He is  currently in a wheelchair for which he uses for long distance but is able to ambulate without difficulty short distance.  He was recently referred to social work by oncology office for psychosocial needs.  His sister recently had contact with patient after learning of his diagnosis and plans on supporting him ongoing. [place]  His greatest concern at today's visit is regarding excessive amount of pain which limits his daily functioning and activity.  Aspirin and Plavix were placed on hold during recent admission at the end of December due to undergoing biopsies and unable to determine why these have not been restarted.  He also has discontinued atorvastatin for undetermined reasons.  Blood pressure today 100/65.  No further concerns at this time.  Stroke admission 03/04/2019: Mr. NIAL HAWE is a 64 y.o. male with history of CAD s/p CABG, cocaine abuse, ischemic cardiomyopathy and tobacco abuse who presented on 03/04/2019 with L sided weakness.  Evaluated by Dr. Leonie Man and stroke team with stroke work-up revealing right MCA infarct embolic as evidenced on MRI in setting of cocaine use and cardiomyopathy.  CTA head/neck showed right M1 occlusion and severe bilateral ICA cavernous stenosis.  CT perfusion no core infarct.  2D echo showed diminished EF 40 to 45% with global hypokinesis.  UDS positive for cocaine.  Previously on aspirin 81 mg daily and recommended DAPT for 3 weeks then aspirin alone given mild stroke.  HTN stable.  LDL 136 and recommended resuming atorvastatin 40 mg daily as patient  previously noncompliant.  Prediabetes with A1c 6.3.  Other stroke risk factors include tobacco use with smoking cessation counseling provided, EtOH use with alcohol level <10, substance abuse with UDS cocaine positive, CAD s/p CABG and stents and ischemic cardiomyopathy with known low EF 30 to 35%.  Residual stroke deficits with mild left hemiparesis and was discharged to CIR for  ongoing therapy on 03/06/19.  During CIR, patient bouts of constipation KUB negative for ileus there was a large stool burden patient did receive fleets enema.  Patient with incidental findings of inguinal mass with ultrasound showed oval hypoechoic mass just below the skin over the left inguinal region corresponding to patient's palpable abnormality.  Felt this could possibly represent abnormal lymph node versus complex fluid collection such as hematoma or subcutaneous abscess remained afebrile with ongoing monitoring.  He was eventually discharged home on 03/15/2019. 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  Biopsy of left inguinal metastatic adenopathy with final pathology showing necrotic tissue with no evidence for malignancy.  He did have follow-up with oncology on 04/03/2019 and recommended undergoing PET scan for further evaluation and additional biopsies.  Reevaluated yesterday by oncology after PET scan with high suspicion malignancy questionable primary bronchogenic carcinoma versus lymphoma with extensive hypermetabolic adenopathy in the abdomen and pelvis with bulky right perihilar mass and left supraclavicular adenopathy as well as large unchanged hypodense masses within the spleen.  He was referred to Dr. Roxan Hockey (thoracic surgery) for reevaluation and consideration of biopsy.      ROS:   14 system review of systems performed and negative with exception of weakness and gait impairment  PMH:  Past Medical History:  Diagnosis Date  . CAD (coronary artery disease)    a. 04/2008 Cath:  LM nl, LAD 50p, 32m, LCX min irregs, RCA 50p/m, 40d, RPDA 60-70ost; b. 04/2008 CABG x 4: LIMA->LAD, VG->D2, VG->RPDA->RPL.  . Cancer ([place])   . Cocaine abuse ([place])    a. Quit early 2019.  . Ischemic cardiomyopathy    a. LV gram: EF 30-35%, apical/periapical AK.  . Stroke ([place]) 02/2019  . Stroke ([place]) 02/2019  . Tobacco abuse     PSH:  Past Surgical History:  Procedure Laterality Date  . BYPASS GRAFT  2010  . CARDIAC SURGERY  2010  . IR IMAGING GUIDED PORT INSERTION  05/26/2019  . SUPRACLAVICAL NODE BIOPSY Left 05/08/2019   Procedure: SUPRACLAVICAL NODE BIOPSY;  Surgeon: [name], MD;  Location: [facility] OR;  Service: Thoracic;  Laterality: Left;    Social History:  Social History   Socioeconomic History  . Marital status: Single    Spouse name: Not on file  . Number of children: Not on file  . Years of education: Not on file  . Highest education level: Not on file  Occupational History  . Not on file  Tobacco Use  . Smoking status: Former Smoker    Packs/day: 0.30    Types: Cigarettes  . Smokeless tobacco: Current User  . Tobacco comment: has smoked for 40+ yrs - at most 1ppd, currently 1ppwk.  Substance and Sexual Activity  . Alcohol use: Not Currently  Comment: quite > 30 yrs ago.  . Drug use: Not Currently    Types: Cocaine    Comment: prev used cocaine - quit early 2019.  Marland Kitchen Sexual activity: Not on file  Other Topics Concern  . Not on file  Social History Narrative   Lives in [place] by himself.  Currently staying in a half-way house designed to help those addicted to drugs get clean, and find housing/employment.  He exercises some - walks/push ups.   Social Determinants of Health   Financial Resource Strain:   . Difficulty of Paying Living Expenses:   Food Insecurity:   . Worried About Charity fundraiser in the Last Year:   . Arboriculturist in the Last Year:   Transportation Needs:   . Film/video editor (Medical):   Marland Kitchen Lack of Transportation  (Non-Medical):   Physical Activity:   . Days of Exercise per Week:   . Minutes of Exercise per Session:   Stress:   . Feeling of Stress :   Social Connections:   . Frequency of Communication with Friends and Family:   . Frequency of Social Gatherings with Friends and Family:   . Attends Religious Services:   . Active Member of Clubs or Organizations:   . Attends Archivist Meetings:   Marland Kitchen Marital Status:   Intimate Partner Violence:   . Fear of Current or Ex-Partner:   . Emotionally Abused:   Marland Kitchen Physically Abused:   . Sexually Abused:     Family History: No family history on file.  Medications:   Current Outpatient Medications on File Prior to Visit  Medication Sig Dispense Refill  . acetaminophen (TYLENOL) 325 MG tablet Take 2 tablets (650 mg total) by mouth every 4 (four) hours as needed for mild pain (or temp > 37.5 C (99.5 F)).    Marland Kitchen allopurinol (ZYLOPRIM) 100 MG tablet Take 1 tablet (100 mg total) by mouth 2 (two) times daily. 60 tablet 2  . aspirin EC 81 MG tablet Take 1 tablet (81 mg total) by mouth daily. 30 tablet 3  . docusate sodium (COLACE) 100 MG capsule Take 1 capsule (100 mg total) by mouth every 12 (twelve) hours. 60 capsule 0  . lidocaine-prilocaine (EMLA) cream Apply 1 application topically as needed. Apply a teaspoon amount to port site an hour prior to chemo therapy, DO NOT rub in, cover with plastic wrap. 30 g 0  . oxyCODONE-acetaminophen (PERCOCET/ROXICET) 5-325 MG tablet Take 1 tablet by mouth every 6 (six) hours as needed for severe pain. 30 tablet 0  . polyethylene glycol (MIRALAX / GLYCOLAX) 17 g packet Take 17 g by mouth daily. 14 each 0  . predniSONE (DELTASONE) 50 MG tablet 2 tablet p.o. daily for 5 days start with the first day of every chemotherapy cycle. 80 tablet 0  . prochlorperazine (COMPAZINE) 10 MG tablet Take 1 tablet (10 mg total) by mouth every 6 (six) hours as needed for nausea or vomiting. 30 tablet 2   No current  facility-administered medications on file prior to visit.    Allergies:  No Known Allergies   Physical Exam  Vitals:   08/26/19 1335  BP: 100/64  Pulse: 83  Weight: 120 lb (54.4 kg)  Height: 5\' 7"  (1.702 m)   Body mass index is 18.79 kg/m. No exam data present  General: Frail pleasant elderly African-American male, seated, in no evident distress Head: head normocephalic and atraumatic.   Neck: supple with no carotid or  supraclavicular bruits Cardiovascular: regular rate and rhythm, no murmurs Musculoskeletal: no deformity Skin:  no rash/petichiae Vascular:  Normal pulses all extremities   Neurologic Exam Mental Status: Awake and fully alert. Normal speech and language. Oriented to place and time. Recent and remote memory intact. Attention span, concentration and fund of knowledge appropriate. Mood and affect appropriate.  Cranial Nerves: Pupils equal, briskly reactive to light. Extraocular movements full without nystagmus. Visual fields full to confrontation. Hearing intact. Facial sensation intact. Face, tongue, palate moves normally and symmetrically.  Motor: Normal bulk and tone.  5/5 extremity testing except LUE positive pronator drift and decreased finger dexterity Sensory.: intact to touch , pinprick , position and vibratory sensation.  Coordination: Rapid alternating movements normal in all extremities except decreased left hand. Finger-to-nose and heel-to-shin performed accurately bilaterally.  Orbits right arm over left arm. Gait and Station: Stands from seated position without difficulty.  Stance is normal.  Ambulates with rolling walker with normal stride length and balance Reflexes: 1+ and symmetric. Toes downgoing.        ASSESSMENT: [name] is a 64 y.o. year old male presented with left-sided weakness on 03/04/2019 with stroke work-up revealing right MCA infarct embolic in setting of cocaine use and cardiomyopathy. Vascular risk factors include UDS  positive cocaine, EtOH use with alcohol level >10, HTN, HLD, prediabetes, ischemic cardiomyopathy and CAD s/p CABG and stents.  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  Residual stroke deficits of mild left hemiparesis but ongoing improvement.  He was in much better spirits at today's visit eager to improve ongoing deficits and pain well managed    PLAN:  1. Right MCA stroke:  -LUE weakness and gait impairment, poststroke: Overall greatly improved and recommend continued exercises at assisted living facility.  Advised to call office or follow-up with PCP if he wishes to participate in therapy.  Advised use of rolling walker at all times for fall prevention -Continue aspirin 81 mg daily for secondary stroke prevention.  Advised to follow-up with cardiology in regards to initiating statin for secondary stroke prevention.  -Maintain strict control of hypertension with blood pressure goal below 130/90, diabetes with hemoglobin A1c goal below 6.5% and cholesterol with LDL cholesterol (bad cholesterol) goal below 70 mg/dL.  I also advised the patient to eat a healthy diet with plenty of whole grains, cereals, fruits and vegetables, exercise regularly with at least 30 minutes of continuous activity daily and maintain ideal body weight. 2. HTN: Stable.  Continue to follow with PCP for monitoring management 3. HLD: Prior LDL 136 and plans on speaking further with cardiology in regards to initiating statin 4. Pre-DMII: Continue to follow PCP for ongoing monitoring management 5. Ischemic cardiomyopathy and CAD: Continue to follow cardiology regularly 6. Non-Hodgkin's lymphoma: Continue to follow with oncology receiving chemotherapy every 3 weeks.  He is in good spirits today and outlook for future is positive.    Follow up in 6 months or call earlier if needed   I spent 35 minutes of face-to-face and  non-face-to-face time with patient and niece.  This included previsit chart review, lab review, study review, order entry, electronic health record documentation, patient education   Frann Rider, [facility]  [facility] [address] [place], [place] [address]  Phone [phone] Fax [phone] Note: This document was prepared with digital dictation and possible smart phrase technology. Any transcriptional errors that result from this process  are unintentional.

## 2019-08-26 NOTE — Patient Instructions (Signed)
Continue aspirin 81 mg daily  for secondary stroke prevention  Recommend starting statin for cholesterol management - please speak further with your cardiology  Continue to follow up with PCP regarding cholesterol, blood pressure and pre-diabetes management   Continue to monitor blood pressure at home  Maintain strict control of hypertension with blood pressure goal below 130/90, diabetes with hemoglobin A1c goal below 6.5% and cholesterol with LDL cholesterol (bad cholesterol) goal below 70 mg/dL. I also advised the patient to eat a healthy diet with plenty of whole grains, cereals, fruits and vegetables, exercise regularly and maintain ideal body weight.  Followup in the future with me in 6 months or call earlier if needed       Thank you for coming to see [place] at [facility]. I hope we have been able to provide you high quality care today.  You may receive a patient satisfaction survey over the next few weeks. We would appreciate your feedback and comments so that we may continue to improve ourselves and the health of our patients.

## 2019-08-29 NOTE — Progress Notes (Signed)
I agree with the above plan 

## 2019-09-01 ENCOUNTER — Telehealth: Payer: Self-pay | Admitting: Medical Oncology

## 2019-09-01 NOTE — Telephone Encounter (Signed)
Faxed covid test results to [facility].

## 2019-09-02 ENCOUNTER — Inpatient Hospital Stay: Payer: Medicaid Other

## 2019-09-02 ENCOUNTER — Encounter: Payer: Self-pay | Admitting: Internal Medicine

## 2019-09-02 ENCOUNTER — Other Ambulatory Visit: Payer: Self-pay

## 2019-09-02 ENCOUNTER — Inpatient Hospital Stay (HOSPITAL_BASED_OUTPATIENT_CLINIC_OR_DEPARTMENT_OTHER): Payer: Medicaid Other | Admitting: Internal Medicine

## 2019-09-02 VITALS — BP 108/73 | HR 73 | Temp 97.7°F | Resp 18 | Ht 67.0 in | Wt 118.1 lb

## 2019-09-02 VITALS — BP 91/69 | HR 76 | Temp 98.0°F | Resp 18

## 2019-09-02 DIAGNOSIS — C8338 Diffuse large B-cell lymphoma, lymph nodes of multiple sites: Secondary | ICD-10-CM | POA: Diagnosis not present

## 2019-09-02 DIAGNOSIS — I10 Essential (primary) hypertension: Secondary | ICD-10-CM

## 2019-09-02 DIAGNOSIS — Z5111 Encounter for antineoplastic chemotherapy: Secondary | ICD-10-CM | POA: Diagnosis not present

## 2019-09-02 DIAGNOSIS — Z95828 Presence of other vascular implants and grafts: Secondary | ICD-10-CM

## 2019-09-02 DIAGNOSIS — I951 Orthostatic hypotension: Secondary | ICD-10-CM

## 2019-09-02 DIAGNOSIS — Z5112 Encounter for antineoplastic immunotherapy: Secondary | ICD-10-CM | POA: Diagnosis not present

## 2019-09-02 LAB — CMP (CANCER CENTER ONLY)
ALT: 12 U/L (ref 0–44)
AST: 11 U/L — ABNORMAL LOW (ref 15–41)
Albumin: 3.7 g/dL (ref 3.5–5.0)
Alkaline Phosphatase: 85 U/L (ref 38–126)
Anion gap: 9 (ref 5–15)
BUN: 14 mg/dL (ref 8–23)
CO2: 25 mmol/L (ref 22–32)
Calcium: 9.6 mg/dL (ref 8.9–10.3)
Chloride: 106 mmol/L (ref 98–111)
Creatinine: 0.82 mg/dL (ref 0.61–1.24)
GFR, Est AFR Am: >60 mL/min (ref >60)
GFR, Estimated: >60 mL/min (ref >60)
Glucose, Bld: 126 mg/dL — ABNORMAL HIGH (ref 70–99)
Potassium: 3.9 mmol/L (ref 3.5–5.1)
Sodium: 140 mmol/L (ref 135–145)
Total Bilirubin: 0.5 mg/dL (ref 0.3–1.2)
Total Protein: 6.9 g/dL (ref 6.5–8.1)

## 2019-09-02 LAB — CBC WITH DIFFERENTIAL (CANCER CENTER ONLY)
Abs Immature Granulocytes: 0.02 10*3/uL (ref 0.00–0.07)
Basophils Absolute: 0.1 10*3/uL (ref 0.0–0.1)
Basophils Relative: 2 %
Eosinophils Absolute: 0.1 10*3/uL (ref 0.0–0.5)
Eosinophils Relative: 1 %
HCT: 39.2 % (ref 39.0–52.0)
Hemoglobin: 13.3 g/dL (ref 13.0–17.0)
Immature Granulocytes: 0 %
Lymphocytes Relative: 13 %
Lymphs Abs: 0.7 10*3/uL (ref 0.7–4.0)
MCH: 32 pg (ref 26.0–34.0)
MCHC: 33.9 g/dL (ref 30.0–36.0)
MCV: 94.5 fL (ref 80.0–100.0)
Monocytes Absolute: 0.7 10*3/uL (ref 0.1–1.0)
Monocytes Relative: 13 %
Neutro Abs: 3.6 10*3/uL (ref 1.7–7.7)
Neutrophils Relative %: 71 %
Platelet Count: 279 10*3/uL (ref 150–400)
RBC: 4.15 MIL/uL — ABNORMAL LOW (ref 4.22–5.81)
RDW: 15.2 % (ref 11.5–15.5)
WBC Count: 5.1 10*3/uL (ref 4.0–10.5)
nRBC: 0 % (ref 0.0–0.2)

## 2019-09-02 LAB — HEPATITIS B SURFACE ANTIGEN: Hepatitis B Surface Ag: NONREACTIVE

## 2019-09-02 LAB — HEPATITIS B SURFACE ANTIBODY,QUALITATIVE: Hep B S Ab: NONREACTIVE

## 2019-09-02 LAB — HEPATITIS B CORE ANTIBODY, TOTAL: Hep B Core Total Ab: NONREACTIVE

## 2019-09-02 MED ORDER — ACETAMINOPHEN 325 MG PO TABS
650.0000 mg | ORAL_TABLET | Freq: Once | ORAL | Status: AC
  Administered 2019-09-02: 650 mg via ORAL

## 2019-09-02 MED ORDER — SODIUM CHLORIDE 0.9% FLUSH
10.0000 mL | INTRAVENOUS | Status: DC | PRN
  Administered 2019-09-02: 10 mL
  Filled 2019-09-02: qty 10

## 2019-09-02 MED ORDER — SODIUM CHLORIDE 0.9 % IV SOLN
Freq: Once | INTRAVENOUS | Status: AC
  Filled 2019-09-02: qty 250

## 2019-09-02 MED ORDER — DIPHENHYDRAMINE HCL 25 MG PO CAPS
ORAL_CAPSULE | ORAL | Status: AC
  Filled 2019-09-02: qty 2

## 2019-09-02 MED ORDER — SODIUM CHLORIDE 0.9 % IV SOLN
375.0000 mg/m2 | Freq: Once | INTRAVENOUS | Status: AC
  Administered 2019-09-02: 600 mg via INTRAVENOUS
  Filled 2019-09-02: qty 50

## 2019-09-02 MED ORDER — SODIUM CHLORIDE 0.9 % IV SOLN
150.0000 mg | Freq: Once | INTRAVENOUS | Status: AC
  Administered 2019-09-02: 150 mg via INTRAVENOUS
  Filled 2019-09-02: qty 150

## 2019-09-02 MED ORDER — DIPHENHYDRAMINE HCL 25 MG PO CAPS
50.0000 mg | ORAL_CAPSULE | Freq: Once | ORAL | Status: AC
  Administered 2019-09-02: 50 mg via ORAL

## 2019-09-02 MED ORDER — SODIUM CHLORIDE 0.9% FLUSH
10.0000 mL | INTRAVENOUS | Status: DC | PRN
  Administered 2019-09-02: 10 mL via INTRAVENOUS
  Filled 2019-09-02: qty 10

## 2019-09-02 MED ORDER — VINCRISTINE SULFATE CHEMO INJECTION 1 MG/ML
2.0000 mg | Freq: Once | INTRAVENOUS | Status: AC
  Administered 2019-09-02: 2 mg via INTRAVENOUS
  Filled 2019-09-02: qty 2

## 2019-09-02 MED ORDER — DOXORUBICIN HCL CHEMO IV INJECTION 2 MG/ML
25.0000 mg/m2 | Freq: Once | INTRAVENOUS | Status: AC
  Administered 2019-09-02: 38 mg via INTRAVENOUS
  Filled 2019-09-02: qty 19

## 2019-09-02 MED ORDER — HEPARIN SOD (PORK) LOCK FLUSH 100 UNIT/ML IV SOLN
500.0000 [IU] | Freq: Once | INTRAVENOUS | Status: AC | PRN
  Administered 2019-09-02: 500 [IU]
  Filled 2019-09-02: qty 5

## 2019-09-02 MED ORDER — SODIUM CHLORIDE 0.9 % IV SOLN
750.0000 mg/m2 | Freq: Once | INTRAVENOUS | Status: AC
  Administered 2019-09-02: 1120 mg via INTRAVENOUS
  Filled 2019-09-02: qty 56

## 2019-09-02 MED ORDER — PALONOSETRON HCL INJECTION 0.25 MG/5ML
INTRAVENOUS | Status: AC
  Filled 2019-09-02: qty 5

## 2019-09-02 MED ORDER — PALONOSETRON HCL INJECTION 0.25 MG/5ML
0.2500 mg | Freq: Once | INTRAVENOUS | Status: AC
  Administered 2019-09-02: 0.25 mg via INTRAVENOUS

## 2019-09-02 MED ORDER — ACETAMINOPHEN 325 MG PO TABS
ORAL_TABLET | ORAL | Status: AC
  Filled 2019-09-02: qty 2

## 2019-09-02 MED ORDER — SODIUM CHLORIDE 0.9 % IV SOLN
10.0000 mg | Freq: Once | INTRAVENOUS | Status: AC
  Administered 2019-09-02: 10 mg via INTRAVENOUS
  Filled 2019-09-02: qty 10

## 2019-09-02 NOTE — Patient Instructions (Signed)
[facility] Discharge Instructions for Patients Receiving Chemotherapy  Today you received the following chemotherapy agents Doxorubicin (ADRIAMYCIN), Vincristine (ONCOVIN), Cyclophosphamide (CYTOXAN) & Rituximab-pvvr (RUXIENCE).   To help prevent nausea and vomiting after your treatment, we encourage you to take your nausea medication as prescribed.   If you develop nausea and vomiting that is not controlled by your nausea medication, call the clinic.   BELOW ARE SYMPTOMS THAT SHOULD BE REPORTED IMMEDIATELY:  *FEVER GREATER THAN 100.5 F  *CHILLS WITH OR WITHOUT FEVER  NAUSEA AND VOMITING THAT IS NOT CONTROLLED WITH YOUR NAUSEA MEDICATION  *UNUSUAL SHORTNESS OF BREATH  *UNUSUAL BRUISING OR BLEEDING  TENDERNESS IN MOUTH AND THROAT WITH OR WITHOUT PRESENCE OF ULCERS  *URINARY PROBLEMS  *BOWEL PROBLEMS  UNUSUAL RASH Items with * indicate a potential emergency and should be followed up as soon as possible.  Feel free to call the clinic should you have any questions or concerns. The clinic phone number is [phone].  Please show the [facility] at check-in to the Emergency Department and triage nurse.

## 2019-09-02 NOTE — Progress Notes (Signed)
Cardiology Office Note   Date:  09/05/2019   ID:  [name], DOB July 08, 1955, MRN [id number]  PCP:  Larry Stain, MD  Cardiologist: Dr. Marlou Porch, MD   Chief Complaint  Patient presents with  . Follow-up    History of Present Illness: [name] is a 64 y.o. male who presents for CAD follow-up, seen for [name].  [name] has a history of CAD status post CABG 2010, prior cocaine use, ischemic cardiomyopathy with an LVEF at 35 to 40%, and tobacco use  He was last seen in follow-up 10/17/2018.  Noted to be lost in follow-up for quite some time.  He was staying at home and [place] designed to help individuals with drug addiction and obtain employment.  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  Had an episode of chest pain which was left-sided and sharp lasting approximately 1 to 2 minutes in duration.  EKG showed inferolateral T wave inversions which were not more pronounced than prior EKGs from 2014.   He had no further chest pain at last office visit.  Continued to smoke cigarettes and was in need of a dental extractions.  Was noted to have some weight loss however denied intention.  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  He initially presented with bulky right hilar and central perihilar mass involving the right upper right middle lobes with massive enlargement of the spleen as well as lymphadenopathy as well as central necrotic lymphadenopathy in the left para-aortic, left common iliac, left external iliac and left inguinal chain.   Currently being treated with systemic chemotherapy with R-CHOP every 3 weeks>> followed by [name].  He was last seen by oncology 09/02/2019 with no specific complaints other than peripheral neuropathy secondary to chemotherapy.  Plan is to follow for repeat CT scan of chest, abdomen, and pelvis for restaging of disease in 3 weeks.   Per [name], plan  from a cardiology standpoint is to repeat echocardiogram given chemotherapy treatment and known ischemic cardiomyopathy with poor EF.  Today [name] is here with his niece. He reports that he is currently living at [facility] while undergoing chemo and radiation. He states that he has been doing really well. Feels a little poor today due to last chemo treatment and will soon undergo his last radiation cycle therefore he is a little fatigued by the end of the week . He denies SOB, chest pain, LE edema, palpitations, dizziness, or syncope. His BP is on the low side today however reports no issues.  Reports his intake has been well.    Past Medical History:  Diagnosis Date  . CAD (coronary artery disease)    a. 04/2008 Cath: LM nl, LAD 50p, 56m, LCX min irregs, RCA 50p/m, 40d, RPDA 60-70ost; b. 04/2008 CABG x 4: LIMA->LAD, VG->D2, VG->RPDA->RPL.  . Cancer ([place])   . Cocaine abuse ([place])    a. Quit early 2019.  . Ischemic cardiomyopathy    a. LV gram: EF 30-35%, apical/periapical AK.  . Stroke ([place]) 02/2019  . Stroke ([place]) 02/2019  . Tobacco abuse     Past Surgical History:  Procedure Laterality Date  . BYPASS GRAFT  2010  . CARDIAC SURGERY  2010  . IR IMAGING GUIDED PORT INSERTION  05/26/2019  . SUPRACLAVICAL NODE BIOPSY Left 05/08/2019   Procedure: SUPRACLAVICAL NODE BIOPSY;  Surgeon: Larry Nakayama, MD;  Location: [facility];  Service: Thoracic;  Laterality: Left;     Current Outpatient Medications  Medication Sig Dispense Refill  . acetaminophen (TYLENOL) 325 MG tablet Take 2 tablets (650 mg total) by mouth every 4 (four) hours as needed for mild pain (or temp > 37.5 C (99.5 F)).    Marland Kitchen aspirin EC 81 MG tablet Take 1 tablet (81 mg total) by mouth daily. 30 tablet 3  . docusate sodium (COLACE) 100 MG capsule Take 1 capsule (100 mg total) by mouth every 12 (twelve) hours. 60 capsule 0  . lidocaine-prilocaine (EMLA) cream Apply 1 application topically as needed. Apply a teaspoon amount  to port site an hour prior to chemo therapy, DO NOT rub in, cover with plastic wrap. 30 g 0  . oxyCODONE-acetaminophen (PERCOCET/ROXICET) 5-325 MG tablet Take 1 tablet by mouth every 6 (six) hours as needed for severe pain. 30 tablet 0  . polyethylene glycol (MIRALAX / GLYCOLAX) 17 g packet Take 17 g by mouth daily. 14 each 0  . predniSONE (DELTASONE) 50 MG tablet 2 tablet p.o. daily for 5 days start with the first day of every chemotherapy cycle. 80 tablet 0  . prochlorperazine (COMPAZINE) 10 MG tablet Take 1 tablet (10 mg total) by mouth every 6 (six) hours as needed for nausea or vomiting. 30 tablet 2   No current facility-administered medications for this visit.    Allergies:   Patient has no known allergies.    Social History:  The patient  reports that he has quit smoking. His smoking use included cigarettes. He smoked 0.30 packs per day. He uses smokeless tobacco. He reports previous alcohol use. He reports previous drug use. Drug: Cocaine.   Family History:  The patient's Family history is unknown by patient.    ROS:  Please see the history of present illness.  Otherwise, review of systems are positive for none.   All other systems are reviewed and negative.    PHYSICAL EXAM: VS:  BP 90/66   Pulse 80   Ht 5\' 7"  (1.702 m)   Wt 120 lb 6.4 oz (54.6 kg)   SpO2 98%   BMI 18.86 kg/m  , BMI Body mass index is 18.86 kg/m.   General: Frail, NAD Neck: Negative for carotid bruits. No JVD Lungs:Clear to ausculation bilaterally. No wheezes, rales, or rhonchi. Breathing is unlabored. Cardiovascular: RRR with S1 S2. No murmurs Extremities: No edema. No clubbing or cyanosis.  Neuro: Alert and oriented. No focal deficits. No facial asymmetry. MAE spontaneously. Psych: Responds to questions appropriately with normal affect.    EKG:  EKG is not ordered today.  Recent Labs: 03/06/2019: Magnesium 1.9 09/02/2019: ALT 12; BUN 14; Creatinine 0.82; Hemoglobin 13.3; Platelet Count 279;  Potassium 3.9; Sodium 140    Lipid Panel    Component Value Date/Time   CHOL 184 03/05/2019 0443   TRIG 72 03/05/2019 0443   HDL 34 (L) 03/05/2019 0443   CHOLHDL 5.4 03/05/2019 0443   VLDL 14 03/05/2019 0443   LDLCALC 136 (H) 03/05/2019 0443      Wt Readings from Last 3 Encounters:  09/05/19 120 lb 6.4 oz (54.6 kg)  09/02/19 118 lb 1.6 oz (53.6 kg)  08/26/19 120 lb (54.4 kg)      Other studies Reviewed: Additional studies/ records that were reviewed today include: Review of the above records demonstrates  Echo 10/17/17 Study Conclusions  - Left ventricle: Septal and apical akinesis mid and basal inferior wall hypokinesis. The cavity size was mildly dilated. Wall thickness  was normal. Systolic function was moderately reduced. The estimated ejection fraction was in the range of 35% to 40%. Wall motion was normal; there were no regional wall motion abnormalities. Doppler parameters are consistent with abnormal left ventricular relaxation (grade 1 diastolic dysfunction). - Atrial septum: No defect or patent foramen ovale was identified.  ASSESSMENT AND PLAN:  1.  CAD with a history of CABG: -Denies anginal symptoms -Continue ASA -No beta-blocker secondary to hypotension -Discussed statin therapy however will defer until after chemo/radiation  2.  Ischemic cardiomyopathy -Does not appear to be fluid volume overloaded on exam -Repeat echo completed prior to OV, pending results  3. Hypotension: -Currently not on antihypertensive therapies secondary to low BPs -BP today 90/66  4.  Tobacco use: -Has reduced intake with plans for cessation  5.  HLD: -LDL found to be 133 -Discussed statin therapy however will defer until after chemo/radiation -Plan for next OV  6.  Non-Hodgkin's lymphoma: -Followed by [name] -Reports last chemo/radiation this week with plans for repeat PET scan   Current medicines are reviewed at length with the patient today.   The patient does not have concerns regarding medicines.  The following changes have been made:  no change  Labs/ tests ordered today include: None  No orders of the defined types were placed in this encounter.    Disposition:   FU with [name] in 6 months  Signed, Kathyrn Drown, NP  09/05/2019 10:31 AM    [address], [place], [place]  [address] Phone: [phone]; Fax: [phone]

## 2019-09-02 NOTE — Progress Notes (Signed)
[facility] [phone]   Fax:([phone]) [phone]  OFFICE PROGRESS NOTE  Elsie Stain, MD 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    PRIOR THERAPY: None.  CURRENT THERAPY: Systemic chemotherapy with R-CHOP every 3 weeks.  First dose May 20, 2019.  Status post 5 cycles.  INTERVAL HISTORY: [name] 64 y.o. male returns to the clinic today for follow-up visit.  The patient is feeling fine today with no concerning complaints except for the peripheral neuropathy in the feet.  He does not take any medication.  He denied having any chest pain, shortness of breath, cough or hemoptysis.  He denied having any fever or chills.  He has no nausea, vomiting, diarrhea or constipation.  He denied having any headache or visual changes.  He continues to tolerate his treatment with R-CHOP fairly well.  The patient is here today for evaluation before starting cycle #6.  MEDICAL HISTORY: Past Medical History:  Diagnosis Date   CAD (coronary artery disease)    a. 04/2008 Cath: LM nl, LAD 50p, 44m, LCX min irregs, RCA 50p/m, 40d, RPDA 60-70ost; b. 04/2008 CABG x 4: LIMA->LAD, VG->D2, VG->RPDA->RPL.   Cancer ([place])    Cocaine abuse ([place])    a. Quit early 2019.   Ischemic cardiomyopathy    a. LV gram: EF 30-35%, apical/periapical AK.   Stroke [facility]) 02/2019   Stroke ([place]) 02/2019   Tobacco abuse     ALLERGIES:  has No Known Allergies.  MEDICATIONS:  Current Outpatient Medications  Medication Sig Dispense Refill   acetaminophen (TYLENOL) 325 MG tablet Take 2 tablets  (650 mg total) by mouth every 4 (four) hours as needed for mild pain (or temp > 37.5 C (99.5 F)).     allopurinol (ZYLOPRIM) 100 MG tablet Take 1 tablet (100 mg total) by mouth 2 (two) times daily. 60 tablet 2   aspirin EC 81 MG tablet Take 1 tablet (81 mg total) by mouth daily. 30 tablet 3   docusate sodium (COLACE) 100 MG capsule Take 1 capsule (100 mg total) by mouth every 12 (twelve) hours. 60 capsule 0   lidocaine-prilocaine (EMLA) cream Apply 1 application topically as needed. Apply a teaspoon amount to port site an hour prior to chemo therapy, DO NOT rub in, cover with plastic wrap. 30 g 0   oxyCODONE-acetaminophen (PERCOCET/ROXICET) 5-325 MG tablet Take 1 tablet by mouth every 6 (six) hours as needed for severe pain. 30 tablet 0   polyethylene glycol (MIRALAX / GLYCOLAX) 17 g packet Take 17 g by mouth daily. 14 each 0   predniSONE (DELTASONE) 50 MG tablet 2 tablet p.o. daily for 5 days start with the first day of every chemotherapy cycle. 80 tablet 0   prochlorperazine (COMPAZINE) 10 MG tablet Take 1 tablet (10 mg total) by mouth every 6 (six) hours as needed for nausea or vomiting. 30 tablet 2   No current facility-administered medications for this visit.    SURGICAL HISTORY:  Past Surgical History:  Procedure Laterality Date   BYPASS GRAFT  2010   CARDIAC SURGERY  2010   IR IMAGING GUIDED  PORT INSERTION  05/26/2019   SUPRACLAVICAL NODE BIOPSY Left 05/08/2019   Procedure: SUPRACLAVICAL NODE BIOPSY;  Surgeon: [name], MD;  Location: [facility];  Service: Thoracic;  Laterality: Left;    REVIEW OF SYSTEMS:  A comprehensive review of systems was negative except for: Constitutional: positive for fatigue Neurological: positive for paresthesia   PHYSICAL EXAMINATION: General appearance: alert, cooperative, fatigued and no distress Head: Normocephalic, without obvious abnormality, atraumatic Neck: no adenopathy, no JVD, supple, symmetrical, trachea midline and thyroid  not enlarged, symmetric, no tenderness/mass/nodules Lymph nodes: Cervical, supraclavicular, and axillary nodes normal. Resp: clear to auscultation bilaterally Back: symmetric, no curvature. ROM normal. No CVA tenderness. Cardio: regular rate and rhythm, S1, S2 normal, no murmur, click, rub or gallop GI: soft, non-tender; bowel sounds normal; no masses,  no organomegaly Extremities: extremities normal, atraumatic, no cyanosis or edema  ECOG PERFORMANCE STATUS: 1 - Symptomatic but completely ambulatory  Blood pressure 108/73, pulse 73, temperature 97.7 F (36.5 C), temperature source Temporal, resp. rate 18, height 5\' 7"  (1.702 m), weight 118 lb 1.6 oz (53.6 kg), SpO2 100 %.  LABORATORY DATA: Lab Results  Component Value Date   WBC 5.1 09/02/2019   HGB 13.3 09/02/2019   HCT 39.2 09/02/2019   MCV 94.5 09/02/2019   PLT 279 09/02/2019      Chemistry      Component Value Date/Time   NA 140 09/02/2019 0941   K 3.9 09/02/2019 0941   CL 106 09/02/2019 0941   CO2 25 09/02/2019 0941   BUN 14 09/02/2019 0941   CREATININE 0.82 09/02/2019 0941      Component Value Date/Time   CALCIUM 9.6 09/02/2019 0941   ALKPHOS 85 09/02/2019 0941   AST 11 (L) 09/02/2019 0941   ALT 12 09/02/2019 0941   BILITOT 0.5 09/02/2019 0941       RADIOGRAPHIC STUDIES: No results found.  ASSESSMENT AND PLAN: This is a 64 years old African-American male recently diagnosed with 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  The patient is currently undergoing systemic chemotherapy with R-CHOP status post 5 cycles.  The patient is doing fine today with no concerning complaints.  He continues to tolerate his treatment with R-CHOP fairly well. I recommended for him to proceed with cycle #6 today  as planned. I will see him back for follow-up visit in 3 weeks for evaluation with repeat CT scan of the chest, abdomen pelvis for restaging of his disease. For the peripheral neuropathy, we will continue to monitor for now and consider the patient for treatment with gabapentin in the future I will reduce his dose of vincristine if his condition continues to get worse. He was advised to call immediately if he has any concerning symptoms in the interval. The patient voices understanding of current disease status and treatment options and is in agreement with the current care plan.  All questions were answered. The patient knows to call the clinic with any problems, questions or concerns. We can certainly see the patient much sooner if necessary.   Disclaimer: This note was dictated with voice recognition software. Similar sounding words can inadvertently be transcribed and may not be corrected upon review.

## 2019-09-03 ENCOUNTER — Telehealth: Payer: Self-pay | Admitting: Internal Medicine

## 2019-09-03 NOTE — Telephone Encounter (Signed)
Scheduled appt per 6/8 los - pt to get an  Updated schedule next visit.

## 2019-09-04 ENCOUNTER — Other Ambulatory Visit: Payer: Self-pay

## 2019-09-04 ENCOUNTER — Other Ambulatory Visit: Payer: Self-pay | Admitting: Medical Oncology

## 2019-09-04 ENCOUNTER — Telehealth: Payer: Self-pay | Admitting: Medical Oncology

## 2019-09-04 ENCOUNTER — Inpatient Hospital Stay: Payer: Medicaid Other

## 2019-09-04 VITALS — BP 99/72 | HR 73 | Temp 98.6°F | Resp 18

## 2019-09-04 DIAGNOSIS — C8338 Diffuse large B-cell lymphoma, lymph nodes of multiple sites: Secondary | ICD-10-CM

## 2019-09-04 DIAGNOSIS — Z5112 Encounter for antineoplastic immunotherapy: Secondary | ICD-10-CM | POA: Diagnosis not present

## 2019-09-04 MED ORDER — PREDNISONE 50 MG PO TABS: ORAL_TABLET | ORAL | 0 refills | Status: DC

## 2019-09-04 MED ORDER — PEGFILGRASTIM-JMDB 6 MG/0.6ML ~~LOC~~ SOSY
PREFILLED_SYRINGE | SUBCUTANEOUS | Status: AC
  Filled 2019-09-04: qty 0.6

## 2019-09-04 MED ORDER — PEGFILGRASTIM-JMDB 6 MG/0.6ML ~~LOC~~ SOSY
6.0000 mg | PREFILLED_SYRINGE | Freq: Once | SUBCUTANEOUS | Status: AC
  Administered 2019-09-04: 6 mg via SUBCUTANEOUS

## 2019-09-04 NOTE — Patient Instructions (Signed)

## 2019-09-04 NOTE — Telephone Encounter (Signed)
[facility] does not have prednisone rx at [facility]. Rx called to pharmacy and I told Verdene Lennert to pick it up and take to pt. Alma Downs notified that he needs to take it.

## 2019-09-05 ENCOUNTER — Encounter: Payer: Self-pay | Admitting: Cardiology

## 2019-09-05 ENCOUNTER — Ambulatory Visit (INDEPENDENT_AMBULATORY_CARE_PROVIDER_SITE_OTHER): Payer: Medicaid Other | Admitting: Cardiology

## 2019-09-05 ENCOUNTER — Ambulatory Visit (HOSPITAL_COMMUNITY): Payer: Medicaid Other | Attending: Cardiology

## 2019-09-05 VITALS — BP 90/66 | HR 80 | Ht 67.0 in | Wt 120.4 lb

## 2019-09-05 DIAGNOSIS — I9589 Other hypotension: Secondary | ICD-10-CM

## 2019-09-05 DIAGNOSIS — I2510 Atherosclerotic heart disease of native coronary artery without angina pectoris: Secondary | ICD-10-CM | POA: Diagnosis not present

## 2019-09-05 DIAGNOSIS — I255 Ischemic cardiomyopathy: Secondary | ICD-10-CM | POA: Insufficient documentation

## 2019-09-05 DIAGNOSIS — E782 Mixed hyperlipidemia: Secondary | ICD-10-CM

## 2019-09-05 DIAGNOSIS — E861 Hypovolemia: Secondary | ICD-10-CM

## 2019-09-05 DIAGNOSIS — Z720 Tobacco use: Secondary | ICD-10-CM | POA: Diagnosis not present

## 2019-09-05 DIAGNOSIS — C8338 Diffuse large B-cell lymphoma, lymph nodes of multiple sites: Secondary | ICD-10-CM | POA: Diagnosis not present

## 2019-09-05 NOTE — Patient Instructions (Signed)
Medication Instructions:   Your physician recommends that you continue on your current medications as directed. Please refer to the Current Medication list given to you today.  *If you need a refill on your cardiac medications before your next appointment, please call your pharmacy*  Lab Work:  None ordered today  Testing/Procedures:  None ordered today  Follow-Up: At [facility], you and your health needs are our priority.  As part of our continuing mission to provide you with exceptional heart care, we have created designated Provider Care Teams.  These Care Teams include your primary Cardiologist (physician) and Advanced Practice Providers (APPs -  Physician Assistants and Nurse Practitioners) who all work together to provide you with the care you need, when you need it.  We recommend signing up for the patient portal called "MyChart".  Sign up information is provided on this After Visit Summary.  MyChart is used to connect with patients for Virtual Visits (Telemedicine).  Patients are able to view lab/test results, encounter notes, upcoming appointments, etc.  Non-urgent messages can be sent to your provider as well.   To learn more about what you can do with MyChart, go to [email or web address].    Your next appointment:   6 month(s)  The format for your next appointment:   In Person  Provider:   You may see Candee Furbish, MD or one of the following Advanced Practice Providers on your designated Care Team:    Truitt Merle, NP  Cecilie Kicks, NP  Kathyrn Drown, NP

## 2019-09-08 ENCOUNTER — Other Ambulatory Visit: Payer: Self-pay | Admitting: Physician Assistant

## 2019-09-09 ENCOUNTER — Inpatient Hospital Stay: Payer: Medicaid Other

## 2019-09-09 ENCOUNTER — Other Ambulatory Visit: Payer: Self-pay

## 2019-09-09 DIAGNOSIS — C8338 Diffuse large B-cell lymphoma, lymph nodes of multiple sites: Secondary | ICD-10-CM

## 2019-09-09 DIAGNOSIS — Z95828 Presence of other vascular implants and grafts: Secondary | ICD-10-CM | POA: Insufficient documentation

## 2019-09-09 DIAGNOSIS — Z5112 Encounter for antineoplastic immunotherapy: Secondary | ICD-10-CM | POA: Diagnosis not present

## 2019-09-09 LAB — CBC WITH DIFFERENTIAL (CANCER CENTER ONLY)
Abs Immature Granulocytes: 0.54 10*3/uL — ABNORMAL HIGH (ref 0.00–0.07)
Basophils Absolute: 0 10*3/uL (ref 0.0–0.1)
Basophils Relative: 0 %
Eosinophils Absolute: 0.2 10*3/uL (ref 0.0–0.5)
Eosinophils Relative: 1 %
HCT: 35.6 % — ABNORMAL LOW (ref 39.0–52.0)
Hemoglobin: 12 g/dL — ABNORMAL LOW (ref 13.0–17.0)
Immature Granulocytes: 3 %
Lymphocytes Relative: 3 %
Lymphs Abs: 0.6 10*3/uL — ABNORMAL LOW (ref 0.7–4.0)
MCH: 32.3 pg (ref 26.0–34.0)
MCHC: 33.7 g/dL (ref 30.0–36.0)
MCV: 96 fL (ref 80.0–100.0)
Monocytes Absolute: 1.1 10*3/uL — ABNORMAL HIGH (ref 0.1–1.0)
Monocytes Relative: 6 %
Neutro Abs: 16.7 10*3/uL — ABNORMAL HIGH (ref 1.7–7.7)
Neutrophils Relative %: 87 %
Platelet Count: 259 10*3/uL (ref 150–400)
RBC: 3.71 MIL/uL — ABNORMAL LOW (ref 4.22–5.81)
RDW: 14.8 % (ref 11.5–15.5)
WBC Count: 19.2 10*3/uL — ABNORMAL HIGH (ref 4.0–10.5)
nRBC: 0 % (ref 0.0–0.2)

## 2019-09-09 LAB — CMP (CANCER CENTER ONLY)
ALT: 14 U/L (ref 0–44)
AST: 10 U/L — ABNORMAL LOW (ref 15–41)
Albumin: 3.6 g/dL (ref 3.5–5.0)
Alkaline Phosphatase: 135 U/L — ABNORMAL HIGH (ref 38–126)
Anion gap: 9 (ref 5–15)
BUN: 13 mg/dL (ref 8–23)
CO2: 26 mmol/L (ref 22–32)
Calcium: 9.1 mg/dL (ref 8.9–10.3)
Chloride: 106 mmol/L (ref 98–111)
Creatinine: 0.71 mg/dL (ref 0.61–1.24)
GFR, Est AFR Am: >60 mL/min (ref >60)
GFR, Estimated: >60 mL/min (ref >60)
Glucose, Bld: 81 mg/dL (ref 70–99)
Potassium: 3.8 mmol/L (ref 3.5–5.1)
Sodium: 141 mmol/L (ref 135–145)
Total Bilirubin: 0.3 mg/dL (ref 0.3–1.2)
Total Protein: 6.5 g/dL (ref 6.5–8.1)

## 2019-09-09 MED ORDER — SODIUM CHLORIDE 0.9% FLUSH
10.0000 mL | Freq: Once | INTRAVENOUS | Status: AC
  Administered 2019-09-09: 10 mL
  Filled 2019-09-09: qty 10

## 2019-09-09 MED ORDER — HEPARIN SOD (PORK) LOCK FLUSH 100 UNIT/ML IV SOLN
500.0000 [IU] | Freq: Once | INTRAVENOUS | Status: AC
  Administered 2019-09-09: 500 [IU]
  Filled 2019-09-09: qty 5

## 2019-09-16 ENCOUNTER — Inpatient Hospital Stay: Payer: Medicaid Other

## 2019-09-16 ENCOUNTER — Encounter (HOSPITAL_COMMUNITY): Payer: Self-pay

## 2019-09-16 ENCOUNTER — Other Ambulatory Visit: Payer: Self-pay

## 2019-09-16 ENCOUNTER — Ambulatory Visit (HOSPITAL_COMMUNITY)
Admission: RE | Admit: 2019-09-16 | Discharge: 2019-09-16 | Disposition: A | Discharge status: Home / Self Care | Payer: Medicaid Other | Source: Ambulatory Visit | Attending: Internal Medicine | Admitting: Internal Medicine

## 2019-09-16 DIAGNOSIS — C8338 Diffuse large B-cell lymphoma, lymph nodes of multiple sites: Secondary | ICD-10-CM | POA: Insufficient documentation

## 2019-09-16 MED ORDER — HEPARIN SOD (PORK) LOCK FLUSH 100 UNIT/ML IV SOLN
INTRAVENOUS | Status: AC
  Administered 2019-09-16: 500 [IU] via INTRAVENOUS
  Filled 2019-09-16: qty 5

## 2019-09-16 MED ORDER — SODIUM CHLORIDE (PF) 0.9 % IJ SOLN
INTRAMUSCULAR | Status: AC
  Filled 2019-09-16: qty 50

## 2019-09-16 MED ORDER — IOHEXOL 300 MG/ML  SOLN
100.0000 mL | Freq: Once | INTRAMUSCULAR | Status: AC | PRN
  Administered 2019-09-16: 100 mL via INTRAVENOUS

## 2019-09-16 MED ORDER — HEPARIN SOD (PORK) LOCK FLUSH 100 UNIT/ML IV SOLN: 500.0000 [IU] | Freq: Once | INTRAVENOUS | Status: AC

## 2019-09-18 ENCOUNTER — Telehealth: Payer: Self-pay | Admitting: Critical Care Medicine

## 2019-09-18 ENCOUNTER — Telehealth: Payer: Self-pay | Admitting: Internal Medicine

## 2019-09-18 NOTE — Telephone Encounter (Signed)
Rec'd call from patient's niece, Verdene Lennert, and I explained to her why I was calling and she say that patient was at [facility] facility.  I spoke with her about Palliative services and she was in agreement with our NP seeing patient at the facility.  I told her that we would contact the facility to get there approval to see patient there and she was in agreement with this.

## 2019-09-18 NOTE — Telephone Encounter (Signed)
Stacy from [facility] called requesting verbal orders to continue for palliative care.   Phone: [phone] option 2

## 2019-09-19 NOTE — Telephone Encounter (Signed)
Ok to give order? 

## 2019-09-19 NOTE — Telephone Encounter (Signed)
Spoke with Langley Gauss Verbal orders for palliative care were given .

## 2019-09-23 ENCOUNTER — Inpatient Hospital Stay: Payer: Medicaid Other

## 2019-09-23 ENCOUNTER — Inpatient Hospital Stay (HOSPITAL_BASED_OUTPATIENT_CLINIC_OR_DEPARTMENT_OTHER): Payer: Medicaid Other | Admitting: Internal Medicine

## 2019-09-23 ENCOUNTER — Encounter: Payer: Self-pay | Admitting: Internal Medicine

## 2019-09-23 ENCOUNTER — Other Ambulatory Visit: Payer: Self-pay

## 2019-09-23 VITALS — BP 105/68 | HR 80 | Temp 98.1°F | Resp 18 | Ht 67.0 in | Wt 118.3 lb

## 2019-09-23 VITALS — BP 97/75 | HR 70 | Temp 97.5°F | Resp 17

## 2019-09-23 DIAGNOSIS — Z5111 Encounter for antineoplastic chemotherapy: Secondary | ICD-10-CM

## 2019-09-23 DIAGNOSIS — I10 Essential (primary) hypertension: Secondary | ICD-10-CM | POA: Diagnosis not present

## 2019-09-23 DIAGNOSIS — Z5112 Encounter for antineoplastic immunotherapy: Secondary | ICD-10-CM | POA: Diagnosis not present

## 2019-09-23 DIAGNOSIS — Z95828 Presence of other vascular implants and grafts: Secondary | ICD-10-CM

## 2019-09-23 DIAGNOSIS — C8338 Diffuse large B-cell lymphoma, lymph nodes of multiple sites: Secondary | ICD-10-CM | POA: Diagnosis not present

## 2019-09-23 LAB — CMP (CANCER CENTER ONLY)
ALT: 27 U/L (ref 0–44)
AST: 16 U/L (ref 15–41)
Albumin: 3.8 g/dL (ref 3.5–5.0)
Alkaline Phosphatase: 87 U/L (ref 38–126)
Anion gap: 7 (ref 5–15)
BUN: 16 mg/dL (ref 8–23)
CO2: 24 mmol/L (ref 22–32)
Calcium: 9.4 mg/dL (ref 8.9–10.3)
Chloride: 109 mmol/L (ref 98–111)
Creatinine: 0.78 mg/dL (ref 0.61–1.24)
GFR, Est AFR Am: >60 mL/min (ref >60)
GFR, Estimated: >60 mL/min (ref >60)
Glucose, Bld: 120 mg/dL — ABNORMAL HIGH (ref 70–99)
Potassium: 3.9 mmol/L (ref 3.5–5.1)
Sodium: 140 mmol/L (ref 135–145)
Total Bilirubin: 0.3 mg/dL (ref 0.3–1.2)
Total Protein: 6.7 g/dL (ref 6.5–8.1)

## 2019-09-23 LAB — CBC WITH DIFFERENTIAL (CANCER CENTER ONLY)
Abs Immature Granulocytes: 0.03 10*3/uL (ref 0.00–0.07)
Basophils Absolute: 0.1 10*3/uL (ref 0.0–0.1)
Basophils Relative: 1 %
Eosinophils Absolute: 0.1 10*3/uL (ref 0.0–0.5)
Eosinophils Relative: 1 %
HCT: 39.9 % (ref 39.0–52.0)
Hemoglobin: 13.5 g/dL (ref 13.0–17.0)
Immature Granulocytes: 1 %
Lymphocytes Relative: 6 %
Lymphs Abs: 0.3 10*3/uL — ABNORMAL LOW (ref 0.7–4.0)
MCH: 32.5 pg (ref 26.0–34.0)
MCHC: 33.8 g/dL (ref 30.0–36.0)
MCV: 96.1 fL (ref 80.0–100.0)
Monocytes Absolute: 0.3 10*3/uL (ref 0.1–1.0)
Monocytes Relative: 5 %
Neutro Abs: 5.1 10*3/uL (ref 1.7–7.7)
Neutrophils Relative %: 86 %
Platelet Count: 266 10*3/uL (ref 150–400)
RBC: 4.15 MIL/uL — ABNORMAL LOW (ref 4.22–5.81)
RDW: 14.8 % (ref 11.5–15.5)
WBC Count: 5.8 10*3/uL (ref 4.0–10.5)
nRBC: 0 % (ref 0.0–0.2)

## 2019-09-23 LAB — URIC ACID: Uric Acid, Serum: 3.5 mg/dL — ABNORMAL LOW (ref 3.7–8.6)

## 2019-09-23 MED ORDER — HEPARIN SOD (PORK) LOCK FLUSH 100 UNIT/ML IV SOLN
500.0000 [IU] | Freq: Once | INTRAVENOUS | Status: AC | PRN
  Administered 2019-09-23: 500 [IU]
  Filled 2019-09-23: qty 5

## 2019-09-23 MED ORDER — VINCRISTINE SULFATE CHEMO INJECTION 1 MG/ML
2.0000 mg | Freq: Once | INTRAVENOUS | Status: AC
  Administered 2019-09-23: 2 mg via INTRAVENOUS
  Filled 2019-09-23: qty 2

## 2019-09-23 MED ORDER — SODIUM CHLORIDE 0.9 % IV SOLN
150.0000 mg | Freq: Once | INTRAVENOUS | Status: AC
  Administered 2019-09-23: 150 mg via INTRAVENOUS
  Filled 2019-09-23: qty 150

## 2019-09-23 MED ORDER — PALONOSETRON HCL INJECTION 0.25 MG/5ML
0.2500 mg | Freq: Once | INTRAVENOUS | Status: AC
  Administered 2019-09-23: 0.25 mg via INTRAVENOUS

## 2019-09-23 MED ORDER — DIPHENHYDRAMINE HCL 25 MG PO CAPS
50.0000 mg | ORAL_CAPSULE | Freq: Once | ORAL | Status: AC
  Administered 2019-09-23: 50 mg via ORAL

## 2019-09-23 MED ORDER — SODIUM CHLORIDE 0.9% FLUSH
10.0000 mL | INTRAVENOUS | Status: DC | PRN
  Administered 2019-09-23: 10 mL
  Filled 2019-09-23: qty 10

## 2019-09-23 MED ORDER — DOXORUBICIN HCL CHEMO IV INJECTION 2 MG/ML
25.0000 mg/m2 | Freq: Once | INTRAVENOUS | Status: AC
  Administered 2019-09-23: 38 mg via INTRAVENOUS
  Filled 2019-09-23: qty 19

## 2019-09-23 MED ORDER — SODIUM CHLORIDE 0.9 % IV SOLN
750.0000 mg/m2 | Freq: Once | INTRAVENOUS | Status: AC
  Administered 2019-09-23: 1120 mg via INTRAVENOUS
  Filled 2019-09-23: qty 56

## 2019-09-23 MED ORDER — PALONOSETRON HCL INJECTION 0.25 MG/5ML
INTRAVENOUS | Status: AC
  Filled 2019-09-23: qty 5

## 2019-09-23 MED ORDER — SODIUM CHLORIDE 0.9% FLUSH
10.0000 mL | Freq: Once | INTRAVENOUS | Status: AC
  Administered 2019-09-23: 10 mL
  Filled 2019-09-23: qty 10

## 2019-09-23 MED ORDER — SODIUM CHLORIDE 0.9 % IV SOLN
375.0000 mg/m2 | Freq: Once | INTRAVENOUS | Status: AC
  Administered 2019-09-23: 600 mg via INTRAVENOUS
  Filled 2019-09-23: qty 10

## 2019-09-23 MED ORDER — ACETAMINOPHEN 325 MG PO TABS
650.0000 mg | ORAL_TABLET | Freq: Once | ORAL | Status: AC
  Administered 2019-09-23: 650 mg via ORAL

## 2019-09-23 MED ORDER — SODIUM CHLORIDE 0.9 % IV SOLN
10.0000 mg | Freq: Once | INTRAVENOUS | Status: AC
  Administered 2019-09-23: 10 mg via INTRAVENOUS
  Filled 2019-09-23: qty 10

## 2019-09-23 MED ORDER — DIPHENHYDRAMINE HCL 25 MG PO CAPS
ORAL_CAPSULE | ORAL | Status: AC
  Filled 2019-09-23: qty 2

## 2019-09-23 MED ORDER — SODIUM CHLORIDE 0.9 % IV SOLN
Freq: Once | INTRAVENOUS | Status: AC
  Filled 2019-09-23: qty 250

## 2019-09-23 MED ORDER — ACETAMINOPHEN 325 MG PO TABS
ORAL_TABLET | ORAL | Status: AC
  Filled 2019-09-23: qty 2

## 2019-09-23 NOTE — Patient Instructions (Signed)
[facility] Discharge Instructions for Patients Receiving Chemotherapy  Today you received the following chemotherapy agents Adriamycin, Vincristine, Cytoxan, Rituxan.  To help prevent nausea and vomiting after your treatment, we encourage you to take your nausea medication as directed.  If you develop nausea and vomiting that is not controlled by your nausea medication, call the clinic.   BELOW ARE SYMPTOMS THAT SHOULD BE REPORTED IMMEDIATELY:  *FEVER GREATER THAN 100.5 F  *CHILLS WITH OR WITHOUT FEVER  NAUSEA AND VOMITING THAT IS NOT CONTROLLED WITH YOUR NAUSEA MEDICATION  *UNUSUAL SHORTNESS OF BREATH  *UNUSUAL BRUISING OR BLEEDING  TENDERNESS IN MOUTH AND THROAT WITH OR WITHOUT PRESENCE OF ULCERS  *URINARY PROBLEMS  *BOWEL PROBLEMS  UNUSUAL RASH Items with * indicate a potential emergency and should be followed up as soon as possible.  Feel free to call the clinic should you have any questions or concerns. The clinic phone number is [phone].  Please show the CHEMO ALERT CARD at check-in to the Emergency Department and triage nurse.   

## 2019-09-23 NOTE — Progress Notes (Signed)
[name] [phone]   [phone]  OFFICE PROGRESS NOTE  Elsie Stain, MD 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    PRIOR THERAPY: None.  CURRENT THERAPY: Systemic chemotherapy with R-CHOP every 3 weeks.  First dose May 20, 2019.  Status post 6 cycles.  INTERVAL HISTORY: [name] 64 y.o. male returns to the clinic today for follow-up visit.  The patient is feeling fine today with no concerning complaints.  He denied having any current chest pain, shortness of breath, cough or hemoptysis.  He denied having any fever or chills.  He has no nausea, vomiting, diarrhea or constipation.  He has no headache or visual changes.  He has been tolerating this treatment fairly well with no concerning adverse effects.  The patient had repeat CT scan of the chest, abdomen pelvis performed recently and he is here for evaluation and discussion of his scan results.  MEDICAL HISTORY: Past Medical History:  Diagnosis Date  . CAD (coronary artery disease)    a. 04/2008 Cath: LM nl, LAD 50p, 57m, LCX min irregs, RCA 50p/m, 40d, RPDA 60-70ost; b. 04/2008 CABG x 4: LIMA->LAD, VG->D2, VG->RPDA->RPL.  . Cancer ([place])   . Cocaine abuse ([place])    a. Quit early 2019.  . Ischemic cardiomyopathy    a. LV gram: EF 30-35%, apical/periapical AK.  . Stroke ([place]) 02/2019  . Stroke ([place] ) 02/2019  . Tobacco abuse     ALLERGIES:  has No Known Allergies.  MEDICATIONS:  Current Outpatient Medications  Medication Sig Dispense Refill  . acetaminophen (TYLENOL) 325 MG tablet Take  2 tablets (650 mg total) by mouth every 4 (four) hours as needed for mild pain (or temp > 37.5 C (99.5 F)).    Marland Kitchen allopurinol (ZYLOPRIM) 100 MG tablet TAKE 1 TABLET (100 MG TOTAL) BY MOUTH 2 (TWO) TIMES DAILY. 60 tablet 2  . aspirin EC 81 MG tablet Take 1 tablet (81 mg total) by mouth daily. 30 tablet 3  . docusate sodium (COLACE) 100 MG capsule Take 1 capsule (100 mg total) by mouth every 12 (twelve) hours. 60 capsule 0  . lidocaine-prilocaine (EMLA) cream Apply 1 application topically as needed. Apply a teaspoon amount to port site an hour prior to chemo therapy, DO NOT rub in, cover with plastic wrap. 30 g 0  . oxyCODONE-acetaminophen (PERCOCET/ROXICET) 5-325 MG tablet Take 1 tablet by mouth every 6 (six) hours as needed for severe pain. 30 tablet 0  . polyethylene glycol (MIRALAX / GLYCOLAX) 17 g packet Take 17 g by mouth daily. 14 each 0  . predniSONE (DELTASONE) 50 MG tablet 2 tablet p.o. daily for 5 days start with the first day of every chemotherapy cycle. 80 tablet 0  . prochlorperazine (COMPAZINE) 10 MG tablet Take 1 tablet (10 mg total) by mouth every 6 (six) hours as needed for nausea or vomiting. 30 tablet 2   No current facility-administered medications for this visit.    SURGICAL HISTORY:  Past Surgical History:  Procedure Laterality Date  . BYPASS GRAFT  2010  . CARDIAC SURGERY  2010  . IR  IMAGING GUIDED PORT INSERTION  05/26/2019  . SUPRACLAVICAL NODE BIOPSY Left 05/08/2019   Procedure: SUPRACLAVICAL NODE BIOPSY;  Surgeon: [name], MD;  Location: [facility];  Service: Thoracic;  Laterality: Left;    REVIEW OF SYSTEMS:  Constitutional: positive for fatigue Eyes: negative Ears, nose, mouth, throat, and face: negative Respiratory: negative Cardiovascular: negative Gastrointestinal: negative Genitourinary:negative Integument/breast: negative Hematologic/lymphatic: negative Musculoskeletal:negative Neurological: negative Behavioral/Psych: negative Endocrine:  negative Allergic/Immunologic: negative   PHYSICAL EXAMINATION: General appearance: alert, cooperative, fatigued and no distress Head: Normocephalic, without obvious abnormality, atraumatic Neck: no adenopathy, no JVD, supple, symmetrical, trachea midline and thyroid not enlarged, symmetric, no tenderness/mass/nodules Lymph nodes: Cervical, supraclavicular, and axillary nodes normal. Resp: clear to auscultation bilaterally Back: symmetric, no curvature. ROM normal. No CVA tenderness. Cardio: regular rate and rhythm, S1, S2 normal, no murmur, click, rub or gallop GI: soft, non-tender; bowel sounds normal; no masses,  no organomegaly Extremities: extremities normal, atraumatic, no cyanosis or edema Neurologic: Alert and oriented X 3, normal strength and tone. Normal symmetric reflexes. Normal coordination and gait  ECOG PERFORMANCE STATUS: 1 - Symptomatic but completely ambulatory  Blood pressure 105/68, pulse 80, temperature 98.1 F (36.7 C), temperature source Temporal, resp. rate 18, height 5\' 7"  (1.702 m), weight 118 lb 4.8 oz (53.7 kg), SpO2 99 %.  LABORATORY DATA: Lab Results  Component Value Date   WBC 5.8 09/23/2019   HGB 13.5 09/23/2019   HCT 39.9 09/23/2019   MCV 96.1 09/23/2019   PLT 266 09/23/2019      Chemistry      Component Value Date/Time   NA 141 09/09/2019 1105   K 3.8 09/09/2019 1105   CL 106 09/09/2019 1105   CO2 26 09/09/2019 1105   BUN 13 09/09/2019 1105   CREATININE 0.71 09/09/2019 1105      Component Value Date/Time   CALCIUM 9.1 09/09/2019 1105   ALKPHOS 135 (H) 09/09/2019 1105   AST 10 (L) 09/09/2019 1105   ALT 14 09/09/2019 1105   BILITOT 0.3 09/09/2019 1105       RADIOGRAPHIC STUDIES: CT Chest W Contrast  Result Date: 09/16/2019 CLINICAL DATA:  Primary Cancer Type: Lymphoma Imaging Indication: Assess response to therapy Interval therapy since last imaging? Yes Initial Cancer Diagnosis Date: 05/08/2019; Established by: Biopsy-proven  Detailed Pathology: Stage IV diffuse large B-cell non-Hodgkin lymphoma. Primary Tumor location: Right hilar lung mass; left supraclavicular adenopathy. Chemotherapy: Yes, R-CHOP; Ongoing? Yes; Most recent administration: 09/02/2019 Immunotherapy?  Yes; Type: Neulasta; Ongoing? Yes EXAM: CT CHEST, ABDOMEN, AND PELVIS WITH CONTRAST TECHNIQUE: Multidetector CT imaging of the chest, abdomen and pelvis was performed following the standard protocol during bolus administration of intravenous contrast. CONTRAST:  116mL OMNIPAQUE IOHEXOL 300 MG/ML SOLN, additional oral enteric contrast COMPARISON:  Most recent CT chest, abdomen and pelvis 07/17/2019. 04/18/2019 PET-CT. FINDINGS: CT CHEST FINDINGS Cardiovascular: Right chest port catheter. Normal heart size. Three-vessel coronary artery calcifications and/or stents. No pericardial effusion. Mediastinum/Nodes: No enlarged mediastinal, hilar, or axillary lymph nodes. Thyroid gland, trachea, and esophagus demonstrate no significant findings. Lungs/Pleura: Unchanged bandlike scarring and fibrosis of the perihilar right lung, predominantly involving the central right middle lobe (series 6, image 90). Multiple small bilateral pulmonary nodules are unchanged, the largest abutting the right hemidiaphragm, measuring 1.1 cm (series 6, image 104) and an additional index nodule in the left upper lobe measuring 6 mm (series 6, image 78). No pleural effusion or pneumothorax. Musculoskeletal: No chest wall mass or suspicious bone lesions identified. Surgical clips in the lower left  neck. CT ABDOMEN PELVIS FINDINGS Hepatobiliary: No solid liver abnormality is seen. Hepatic steatosis. No gallstones, gallbladder wall thickening, or biliary dilatation. Pancreas: Unremarkable. No pancreatic ductal dilatation or surrounding inflammatory changes. Spleen: Decreased bulk of the spleen, which remains very heterogeneous in appearance, now measuring approximately 9.0 cm in greatest span, previously 9.7  cm. Adrenals/Urinary Tract: Adrenal glands are unremarkable. Kidneys are normal, without renal calculi, solid lesion, or hydronephrosis. Incidental small diverticulum of the posterior left bladder. Stomach/Bowel: Stomach is within normal limits. Appendix appears normal. No evidence of bowel wall thickening, distention, or inflammatory changes. Vascular/Lymphatic: Aortic atherosclerosis. No enlarged abdominal or pelvic lymph nodes. There is residual retroperitoneal soft tissue which is slightly decreased in thickness (series 2, image 70). Reproductive: No mass or other abnormality. Other: No abdominal wall hernia or abnormality. No abdominopelvic ascites. Musculoskeletal: No acute or significant osseous findings. IMPRESSION: 1. Unchanged post treatment bandlike scarring and fibrosis of the perihilar right lung, predominantly involving the central right middle lobe. No residual mass or soft tissue. 2. Decreased bulk of the spleen, which remains very heterogeneous in appearance, consistent with continued treatment response. 3. There is residual retroperitoneal soft tissue which is slightly decreased in thickness, consistent with continued treatment response. 4. No residual or recurrent lymphadenopathy in the chest, abdomen, or pelvis. 5. Multiple small bilateral pulmonary nodules are unchanged and remain nonspecific. Continued attention on follow-up. 6. Hepatic steatosis. 7. Coronary artery disease.  Aortic Atherosclerosis (ICD10-I70.0). Electronically Signed   By: Eddie Candle M.D.   On: 09/16/2019 10:23   CT Abdomen Pelvis W Contrast  Result Date: 09/16/2019 CLINICAL DATA:  Primary Cancer Type: Lymphoma Imaging Indication: Assess response to therapy Interval therapy since last imaging? Yes Initial Cancer Diagnosis Date: 05/08/2019; Established by: Biopsy-proven Detailed Pathology: Stage IV diffuse large B-cell non-Hodgkin lymphoma. Primary Tumor location: Right hilar lung mass; left supraclavicular adenopathy.  Chemotherapy: Yes, R-CHOP; Ongoing? Yes; Most recent administration: 09/02/2019 Immunotherapy?  Yes; Type: Neulasta; Ongoing? Yes EXAM: CT CHEST, ABDOMEN, AND PELVIS WITH CONTRAST TECHNIQUE: Multidetector CT imaging of the chest, abdomen and pelvis was performed following the standard protocol during bolus administration of intravenous contrast. CONTRAST:  167mL OMNIPAQUE IOHEXOL 300 MG/ML SOLN, additional oral enteric contrast COMPARISON:  Most recent CT chest, abdomen and pelvis 07/17/2019. 04/18/2019 PET-CT. FINDINGS: CT CHEST FINDINGS Cardiovascular: Right chest port catheter. Normal heart size. Three-vessel coronary artery calcifications and/or stents. No pericardial effusion. Mediastinum/Nodes: No enlarged mediastinal, hilar, or axillary lymph nodes. Thyroid gland, trachea, and esophagus demonstrate no significant findings. Lungs/Pleura: Unchanged bandlike scarring and fibrosis of the perihilar right lung, predominantly involving the central right middle lobe (series 6, image 90). Multiple small bilateral pulmonary nodules are unchanged, the largest abutting the right hemidiaphragm, measuring 1.1 cm (series 6, image 104) and an additional index nodule in the left upper lobe measuring 6 mm (series 6, image 78). No pleural effusion or pneumothorax. Musculoskeletal: No chest wall mass or suspicious bone lesions identified. Surgical clips in the lower left neck. CT ABDOMEN PELVIS FINDINGS Hepatobiliary: No solid liver abnormality is seen. Hepatic steatosis. No gallstones, gallbladder wall thickening, or biliary dilatation. Pancreas: Unremarkable. No pancreatic ductal dilatation or surrounding inflammatory changes. Spleen: Decreased bulk of the spleen, which remains very heterogeneous in appearance, now measuring approximately 9.0 cm in greatest span, previously 9.7 cm. Adrenals/Urinary Tract: Adrenal glands are unremarkable. Kidneys are normal, without renal calculi, solid lesion, or hydronephrosis. Incidental  small diverticulum of the posterior left bladder. Stomach/Bowel: Stomach is within normal limits. Appendix appears normal. No evidence  of bowel wall thickening, distention, or inflammatory changes. Vascular/Lymphatic: Aortic atherosclerosis. No enlarged abdominal or pelvic lymph nodes. There is residual retroperitoneal soft tissue which is slightly decreased in thickness (series 2, image 70). Reproductive: No mass or other abnormality. Other: No abdominal wall hernia or abnormality. No abdominopelvic ascites. Musculoskeletal: No acute or significant osseous findings. IMPRESSION: 1. Unchanged post treatment bandlike scarring and fibrosis of the perihilar right lung, predominantly involving the central right middle lobe. No residual mass or soft tissue. 2. Decreased bulk of the spleen, which remains very heterogeneous in appearance, consistent with continued treatment response. 3. There is residual retroperitoneal soft tissue which is slightly decreased in thickness, consistent with continued treatment response. 4. No residual or recurrent lymphadenopathy in the chest, abdomen, or pelvis. 5. Multiple small bilateral pulmonary nodules are unchanged and remain nonspecific. Continued attention on follow-up. 6. Hepatic steatosis. 7. Coronary artery disease.  Aortic Atherosclerosis (ICD10-I70.0). Electronically Signed   By: Eddie Candle M.D.   On: 09/16/2019 10:23   ECHOCARDIOGRAM COMPLETE  Result Date: 09/05/2019    ECHOCARDIOGRAM REPORT   Patient Name:   [name] Date of Exam: 09/05/2019 Medical Rec #:  [id number]       Height:       67.0 in Accession #:    [id number]      Weight:       118.1 lb Date of Birth:  1955-07-18       BSA:          1.617 m Patient Age:    84 years        BP:           99/72 mmHg Patient Gender: M               HR:           75 bpm. Exam Location:  [facility] Procedure: 2D Echo, Cardiac Doppler, Color Doppler and Strain Analysis Indications:    Receiving treatment for Non Hodgkin's  Lymphoma                 Congestive heart failure  History:        Patient has prior history of Echocardiogram examinations, most                 recent 05/16/2019. Risk Factors:Former cocaine abuse. Ischemic                 cardiomyopathy. Stroke. Coronary artery disease.  Sonographer:    Wilford Sports Rodgers-Jones RDCS Referring Phys: [id number] MARK C SKAINS IMPRESSIONS  1. Left ventricular ejection fraction, by estimation, is 40 to 45%. The left ventricle has mildly decreased function. The left ventricle demonstrates regional wall motion abnormalities (see scoring diagram/findings for description). Left ventricular diastolic parameters are indeterminate.  2. Right ventricular systolic function is normal. The right ventricular size is normal. There is normal pulmonary artery systolic pressure.  3. The mitral valve is normal in structure. Trivial mitral valve regurgitation. No evidence of mitral stenosis.  4. The aortic valve is tricuspid. Aortic valve regurgitation is not visualized. No aortic stenosis is present.  5. Aortic dilatation noted. There is borderline dilatation of the ascending aorta measuring 37 mm.  6. The inferior vena cava is normal in size with greater than 50% respiratory variability, suggesting right atrial pressure of 3 mmHg. FINDINGS  Left Ventricle: Left ventricular ejection fraction, by estimation, is 40 to 45%. The left ventricle has mildly decreased function. The left ventricle demonstrates regional wall motion  abnormalities. Severe hypokinesis of the left ventricular, mid-apical  anteroseptal wall, apical segment, anterior wall and anterolateral wall. The left ventricular internal cavity size was normal in size. There is no left ventricular hypertrophy. Left ventricular diastolic parameters are indeterminate. Right Ventricle: The right ventricular size is normal. No increase in right ventricular wall thickness. Right ventricular systolic function is normal. There is normal pulmonary artery systolic  pressure. The tricuspid regurgitant velocity is 1.89 m/s, and  with an assumed right atrial pressure of 3 mmHg, the estimated right ventricular systolic pressure is 81.0 mmHg. Left Atrium: Left atrial size was normal in size. Right Atrium: Right atrial size was normal in size. Pericardium: There is no evidence of pericardial effusion. Mitral Valve: The mitral valve is normal in structure. There is mild thickening of the mitral valve leaflet(s). Trivial mitral valve regurgitation. No evidence of mitral valve stenosis. Tricuspid Valve: The tricuspid valve is normal in structure. Tricuspid valve regurgitation is trivial. No evidence of tricuspid stenosis. Aortic Valve: The aortic valve is tricuspid. Aortic valve regurgitation is not visualized. No aortic stenosis is present. Pulmonic Valve: The pulmonic valve was not well visualized. Pulmonic valve regurgitation is not visualized. No evidence of pulmonic stenosis. Aorta: Aortic dilatation noted. There is borderline dilatation of the ascending aorta measuring 37 mm. Venous: The inferior vena cava is normal in size with greater than 50% respiratory variability, suggesting right atrial pressure of 3 mmHg. IAS/Shunts: No atrial level shunt detected by color flow Doppler.  LEFT VENTRICLE PLAX 2D LVIDd:         4.70 cm  Diastology LVIDs:         3.70 cm  LV e' lateral:   5.00 cm/s LV PW:         0.90 cm  LV E/e' lateral: 8.2 LV IVS:        0.90 cm  LV e' medial:    5.66 cm/s LVOT diam:     2.40 cm  LV E/e' medial:  7.3 LV SV:         69 LV SV Index:   43 LVOT Area:     4.52 cm  RIGHT VENTRICLE RV Basal diam:  4.00 cm RV S prime:     13.40 cm/s TAPSE (M-mode): 1.7 cm LEFT ATRIUM             Index       RIGHT ATRIUM           Index LA diam:        3.70 cm 2.29 cm/m  RA Area:     11.50 cm LA Vol (A2C):   34.0 ml 21.03 ml/m RA Volume:   30.20 ml  18.68 ml/m LA Vol (A4C):   27.5 ml 17.01 ml/m LA Biplane Vol: 30.9 ml 19.11 ml/m  AORTIC VALVE LVOT Vmax:   64.60 cm/s LVOT  Vmean:  51.900 cm/s LVOT VTI:    0.153 m  AORTA Ao Root diam: 3.80 cm Ao Asc diam:  3.70 cm MITRAL VALVE               TRICUSPID VALVE MV Area (PHT): 2.80 cm    TR Peak grad:   14.3 mmHg MV Decel Time: 271 msec    TR Vmax:        189.00 cm/s MV E velocity: 41.10 cm/s MV A velocity: 67.70 cm/s  SHUNTS MV E/A ratio:  0.61        Systemic VTI:  0.15 m  Systemic Diam: 2.40 cm Buford Dresser MD Electronically signed by Buford Dresser MD Signature Date/Time: 09/05/2019/5:36:53 PM    Final     ASSESSMENT AND PLAN: This is a 64 years old African-American male recently diagnosed with 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  The patient is currently undergoing systemic chemotherapy with R-CHOP status post 6 cycles.  The patient has been tolerating this treatment well with no concerning adverse effect except for mild fatigue and mild peripheral neuropathy. He had repeat CT scan of the chest, abdomen pelvis performed recently.  I personally and independently reviewed the scans and discussed the results with the patient today. His scan showed further improvement of his disease with no concerning findings of progression. I recommended for the patient to proceed with cycle #7 today as planned.  We we will complete a total of 8 cycles of his treatment. The patient will come back for follow-up visit in 3 weeks for evaluation before the next cycle of his treatment. For the peripheral neuropathy, it is currently mild and we will continue to monitor for now. The patient was advised to call immediately if he has any other concerning symptoms in the interval. The patient voices understanding of current disease status and treatment options and is in agreement with  the current care plan.  All questions were answered. The patient knows to call the clinic with any problems, questions or concerns. We can certainly see the patient much sooner if necessary.   Disclaimer: This note was dictated with voice recognition software. Similar sounding words can inadvertently be transcribed and may not be corrected upon review.

## 2019-09-24 ENCOUNTER — Telehealth: Payer: Self-pay

## 2019-09-24 NOTE — Telephone Encounter (Signed)
Signed adult home care orders faxed to [facility] - attn : [email or web address]

## 2019-09-25 ENCOUNTER — Other Ambulatory Visit: Payer: Self-pay

## 2019-09-25 ENCOUNTER — Inpatient Hospital Stay: Payer: Medicaid Other | Attending: Internal Medicine

## 2019-09-25 VITALS — BP 96/59 | HR 73 | Temp 98.3°F | Resp 17

## 2019-09-25 DIAGNOSIS — Z5189 Encounter for other specified aftercare: Secondary | ICD-10-CM | POA: Insufficient documentation

## 2019-09-25 DIAGNOSIS — C8338 Diffuse large B-cell lymphoma, lymph nodes of multiple sites: Secondary | ICD-10-CM | POA: Insufficient documentation

## 2019-09-25 DIAGNOSIS — H109 Unspecified conjunctivitis: Secondary | ICD-10-CM | POA: Diagnosis not present

## 2019-09-25 DIAGNOSIS — Z5111 Encounter for antineoplastic chemotherapy: Secondary | ICD-10-CM | POA: Diagnosis present

## 2019-09-25 DIAGNOSIS — Z5112 Encounter for antineoplastic immunotherapy: Secondary | ICD-10-CM | POA: Diagnosis not present

## 2019-09-25 DIAGNOSIS — G629 Polyneuropathy, unspecified: Secondary | ICD-10-CM | POA: Diagnosis not present

## 2019-09-25 DIAGNOSIS — Z87891 Personal history of nicotine dependence: Secondary | ICD-10-CM | POA: Diagnosis not present

## 2019-09-25 MED ORDER — PEGFILGRASTIM-JMDB 6 MG/0.6ML ~~LOC~~ SOSY
PREFILLED_SYRINGE | SUBCUTANEOUS | Status: AC
  Filled 2019-09-25: qty 0.6

## 2019-09-25 MED ORDER — PEGFILGRASTIM-JMDB 6 MG/0.6ML ~~LOC~~ SOSY
6.0000 mg | PREFILLED_SYRINGE | Freq: Once | SUBCUTANEOUS | Status: AC
  Administered 2019-09-25: 6 mg via SUBCUTANEOUS

## 2019-09-25 NOTE — Patient Instructions (Signed)

## 2019-09-30 ENCOUNTER — Inpatient Hospital Stay (HOSPITAL_BASED_OUTPATIENT_CLINIC_OR_DEPARTMENT_OTHER): Payer: Medicaid Other | Admitting: Medical

## 2019-09-30 ENCOUNTER — Inpatient Hospital Stay: Payer: Medicaid Other

## 2019-09-30 ENCOUNTER — Telehealth: Payer: Self-pay | Admitting: Medical Oncology

## 2019-09-30 ENCOUNTER — Other Ambulatory Visit: Payer: Self-pay

## 2019-09-30 VITALS — BP 92/61 | HR 78 | Temp 98.1°F | Resp 17 | Ht 67.0 in | Wt 123.0 lb

## 2019-09-30 DIAGNOSIS — C8338 Diffuse large B-cell lymphoma, lymph nodes of multiple sites: Secondary | ICD-10-CM

## 2019-09-30 DIAGNOSIS — Z95828 Presence of other vascular implants and grafts: Secondary | ICD-10-CM

## 2019-09-30 DIAGNOSIS — H10231 Serous conjunctivitis, except viral, right eye: Secondary | ICD-10-CM | POA: Diagnosis not present

## 2019-09-30 DIAGNOSIS — Z5112 Encounter for antineoplastic immunotherapy: Secondary | ICD-10-CM | POA: Diagnosis not present

## 2019-09-30 LAB — CBC WITH DIFFERENTIAL (CANCER CENTER ONLY)
Abs Immature Granulocytes: 0.5 10*3/uL — ABNORMAL HIGH (ref 0.00–0.07)
Basophils Absolute: 0.1 10*3/uL (ref 0.0–0.1)
Basophils Relative: 1 %
Eosinophils Absolute: 0.1 10*3/uL (ref 0.0–0.5)
Eosinophils Relative: 1 %
HCT: 34.3 % — ABNORMAL LOW (ref 39.0–52.0)
Hemoglobin: 11.8 g/dL — ABNORMAL LOW (ref 13.0–17.0)
Immature Granulocytes: 3 %
Lymphocytes Relative: 4 %
Lymphs Abs: 0.7 10*3/uL (ref 0.7–4.0)
MCH: 33.1 pg (ref 26.0–34.0)
MCHC: 34.4 g/dL (ref 30.0–36.0)
MCV: 96.1 fL (ref 80.0–100.0)
Monocytes Absolute: 0.9 10*3/uL (ref 0.1–1.0)
Monocytes Relative: 5 %
Neutro Abs: 14.1 10*3/uL — ABNORMAL HIGH (ref 1.7–7.7)
Neutrophils Relative %: 86 %
Platelet Count: 150 10*3/uL (ref 150–400)
RBC: 3.57 MIL/uL — ABNORMAL LOW (ref 4.22–5.81)
RDW: 14.5 % (ref 11.5–15.5)
WBC Count: 16.3 10*3/uL — ABNORMAL HIGH (ref 4.0–10.5)
nRBC: 0 % (ref 0.0–0.2)

## 2019-09-30 LAB — CMP (CANCER CENTER ONLY)
ALT: 19 U/L (ref 0–44)
AST: 10 U/L — ABNORMAL LOW (ref 15–41)
Albumin: 3.6 g/dL (ref 3.5–5.0)
Alkaline Phosphatase: 123 U/L (ref 38–126)
Anion gap: 9 (ref 5–15)
BUN: 10 mg/dL (ref 8–23)
CO2: 27 mmol/L (ref 22–32)
Calcium: 9.4 mg/dL (ref 8.9–10.3)
Chloride: 102 mmol/L (ref 98–111)
Creatinine: 0.7 mg/dL (ref 0.61–1.24)
GFR, Est AFR Am: >60 mL/min (ref >60)
GFR, Estimated: >60 mL/min (ref >60)
Glucose, Bld: 133 mg/dL — ABNORMAL HIGH (ref 70–99)
Potassium: 3.6 mmol/L (ref 3.5–5.1)
Sodium: 138 mmol/L (ref 135–145)
Total Bilirubin: 0.5 mg/dL (ref 0.3–1.2)
Total Protein: 6.4 g/dL — ABNORMAL LOW (ref 6.5–8.1)

## 2019-09-30 MED ORDER — GENTAMICIN SULFATE 0.1 % EX OINT: 1.0000 "application " | TOPICAL_OINTMENT | Freq: Three times a day (TID) | CUTANEOUS | 0 refills | Status: DC

## 2019-09-30 MED ORDER — HEPARIN SOD (PORK) LOCK FLUSH 100 UNIT/ML IV SOLN
500.0000 [IU] | Freq: Once | INTRAVENOUS | Status: AC
  Administered 2019-09-30: 500 [IU]
  Filled 2019-09-30: qty 5

## 2019-09-30 MED ORDER — SODIUM CHLORIDE 0.9% FLUSH
10.0000 mL | Freq: Once | INTRAVENOUS | Status: AC
  Administered 2019-09-30: 10 mL
  Filled 2019-09-30: qty 10

## 2019-09-30 NOTE — Telephone Encounter (Signed)
LVM that [facility] planned for 8 cycles of chemo.

## 2019-09-30 NOTE — Patient Instructions (Signed)
      [facility]    , [place]    Phone:      September 30, 2019  Patient: [name]  Date of Birth: 02/14/1956  Date of Visit: September 30, 2019    To Whom It May Concern:  Elbert Spickler was seen and treated on September 30, 2019 and  was given a prescription for an ointment for his right eye infection. His prescription needs to be picked up at the [facility] today.  .      If you have any questions or concerns, please don't hesitate to call.   Sincerely,   Sandi Mealy, MHS, PA-C Physician Assistant    Treatment Team:  Attending Provider: Harle Stanford PA-C Physician Assistant: Harle Stanford., PA-C

## 2019-10-02 NOTE — Progress Notes (Signed)
[facility] Progress Note   [name] [id number] 07/13/1955 64 y.o.  [name] is managed by Dr. Fanny Navarro. [name]  Actively treated with chemotherapy/immunotherapy/hormonal therapy: yes  Current therapy: R-CHOP   Last treated:  09/23/2019 (cycle #7)  Next scheduled appointment with provider: 10/14/2019  Assessment: Plan:    Serous conjunctivitis of right eye - Plan: gentamicin ointment (GARAMYCIN) 0.1 %  Diffuse large B-cell lymphoma of lymph nodes of multiple regions (HCC)   Conjunctivitis of the right eye: The patient was given a prescription for gentamicin ointment.  Diffuse large B-cell lymphoma: The patient continues to be managed by Dr. Julien Navarro and is status post cycle 7 of R-CHOP which was dosed on 09/23/2019. He is scheduled to be seen in follow-up on 10/14/2019.  Please see After Visit Summary for patient specific instructions.  Future Appointments  Date Time Provider [place]  10/07/2019 10:00 AM [facility] LAB 1 [facility] None  10/07/2019 10:15 AM [facility] [place] FLUSH [facility] None  10/14/2019 10:45 AM [facility] ONLY [facility] None  10/14/2019 11:00 AM [facility] [place] FLUSH [facility] None  10/14/2019 11:30 AM Larry Bears, MD [facility] None  10/14/2019 12:15 PM [facility] INFUSION [facility] None  10/16/2019 11:30 AM [facility] [place] FLUSH [facility] None  10/21/2019 10:00 AM [facility]/FLUSH [facility] None  10/21/2019 10:15 AM [facility] [place] FLUSH [facility] None  10/28/2019 10:00 AM [facility] LAB/FLUSH [facility] None  10/28/2019 10:15 AM [facility] [place] FLUSH [facility] None  11/04/2019 10:00 AM [facility]/FLUSH [facility] None  11/04/2019 10:15 AM [facility] [place] FLUSH [facility] None  02/25/2020 10:45 AM McCue, Larry Billow, NP [facility] None    No orders of the defined types were placed in this encounter.      Subjective:   Patient ID:  [name] is a 64 y.o. (DOB 1955-03-31) male.  Chief Complaint:  Chief Complaint    Patient presents with  . Eye Problem    Right    HPI [name] is a 64 y.o. male with a diagnosis of large B-cell lymphoma. He is managed by Dr. Earlie Navarro and is status post cycle 7 of R-CHOP which was dosed on 09/23/2019. The patient presents to the clinic today as a walk-in patient. He reports that he has had swelling and irritation of his right eye. He denies any injury or changes in health. He has not had any recent upper respiratory tract infections. No one else in his home has been sick.  Medications: I have reviewed the patient's current medications.  Allergies: No Known Allergies  Past Medical History:  Diagnosis Date  . CAD (coronary artery disease)    a. 04/2008 Cath: LM nl, LAD 50p, 70m, LCX min irregs, RCA 50p/m, 40d, RPDA 60-70ost; b. 04/2008 CABG x 4: LIMA->LAD, VG->D2, VG->RPDA->RPL.  . Cancer ([place])   . Cocaine abuse ([place])    a. Quit early 2019.  . Ischemic cardiomyopathy    a. LV gram: EF 30-35%, apical/periapical AK.  . Stroke ([place]) 02/2019  . Stroke ([place]) 02/2019  . Tobacco abuse     Past Surgical History:  Procedure Laterality Date  . BYPASS GRAFT  2010  . CARDIAC SURGERY  2010  . IR IMAGING GUIDED PORT INSERTION  05/26/2019  . SUPRACLAVICAL NODE BIOPSY Left 05/08/2019   Procedure: SUPRACLAVICAL NODE BIOPSY;  Surgeon: [name], MD;  Location: [facility] OR;  Service: Thoracic;  Laterality: Left;    Family History  Family history unknown: Yes    Social History   Socioeconomic History  .  Marital status: Single    Spouse name: Not on file  . Number of children: Not on file  . Years of education: Not on file  . Highest education level: Not on file  Occupational History  . Not on file  Tobacco Use  . Smoking status: Former Smoker    Packs/day: 0.30    Types: Cigarettes  . Smokeless tobacco: Current User  . Tobacco comment: has smoked for 40+ yrs - at most 1ppd, currently 1ppwk.  Vaping Use  . Vaping Use: Never used  Substance and Sexual  Activity  . Alcohol use: Not Currently    Comment: quite > 30 yrs ago.  . Drug use: Not Currently    Types: Cocaine    Comment: prev used cocaine - quit early 2019.  Marland Kitchen Sexual activity: Not on file  Other Topics Concern  . Not on file  Social History Narrative   Lives in [place] by himself.  Currently staying in a half-way house designed to help those addicted to drugs get clean, and find housing/employment.  He exercises some - walks/push ups.   Social Determinants of Health   Financial Resource Strain:   . Difficulty of Paying Living Expenses:   Food Insecurity:   . Worried About Charity fundraiser in the Last Year:   . Arboriculturist in the Last Year:   Transportation Needs:   . Film/video editor (Medical):   Marland Kitchen Lack of Transportation (Non-Medical):   Physical Activity:   . Days of Exercise per Week:   . Minutes of Exercise per Session:   Stress:   . Feeling of Stress :   Social Connections:   . Frequency of Communication with Friends and Family:   . Frequency of Social Gatherings with Friends and Family:   . Attends Religious Services:   . Active Member of Clubs or Organizations:   . Attends Archivist Meetings:   Marland Kitchen Marital Status:   Intimate Partner Violence:   . Fear of Current or Ex-Partner:   . Emotionally Abused:   Marland Kitchen Physically Abused:   . Sexually Abused:     Past Medical History, Surgical history, Social history, and Family history were reviewed and updated as appropriate.   Please see review of systems for further details on the patient's review from today.   Review of Systems:  Review of Systems  Constitutional: Negative for chills, diaphoresis and fever.  HENT: Negative for trouble swallowing and voice change.   Eyes: Positive for pain and redness.  Respiratory: Negative for cough, chest tightness, shortness of breath and wheezing.   Cardiovascular: Negative for chest pain and palpitations.  Gastrointestinal: Negative for abdominal pain,  constipation, diarrhea, nausea and vomiting.  Musculoskeletal: Negative for back pain and myalgias.  Neurological: Negative for dizziness, light-headedness and headaches.    Objective:   Physical Exam:  BP 92/61 (BP Location: Left Arm)   Pulse 78   Temp 98.1 F (36.7 C)   Resp 17   Ht 5\' 7"  (1.702 m)   Wt 123 lb (55.8 kg)   SpO2 100%   BMI 19.26 kg/m  ECOG: 1  Physical Exam Constitutional:      General: He is not in acute distress.    Appearance: He is not diaphoretic.  HENT:     Head: Normocephalic and atraumatic.  Eyes:     General:        Right eye: No discharge.        Left eye:  No discharge.     Conjunctiva/sclera:     Right eye: Right conjunctiva is injected. No exudate or hemorrhage.    Left eye: Left conjunctiva is not injected. No exudate or hemorrhage.    Pupils: Pupils are equal.     Funduscopic exam:    Right eye: No hemorrhage, exudate, AV nicking or papilledema.        Left eye: No hemorrhage, exudate, AV nicking or papilledema.     Comments: The right inferior eyelid is edematous. No discharge is appreciated.  Cardiovascular:     Rate and Rhythm: Normal rate and regular rhythm.     Heart sounds: Normal heart sounds. No murmur heard.  No friction rub. No gallop.   Pulmonary:     Effort: Pulmonary effort is normal. No respiratory distress.     Breath sounds: Normal breath sounds. No wheezing or rales.  Skin:    General: Skin is warm and dry.     Findings: No erythema or rash.  Neurological:     Mental Status: He is alert.     Lab Review:     Component Value Date/Time   NA 138 09/30/2019 0956   K 3.6 09/30/2019 0956   CL 102 09/30/2019 0956   CO2 27 09/30/2019 0956   GLUCOSE 133 (H) 09/30/2019 0956   BUN 10 09/30/2019 0956   CREATININE 0.70 09/30/2019 0956   CALCIUM 9.4 09/30/2019 0956   PROT 6.4 (L) 09/30/2019 0956   ALBUMIN 3.6 09/30/2019 0956   AST 10 (L) 09/30/2019 0956   ALT 19 09/30/2019 0956   ALKPHOS 123 09/30/2019 0956    BILITOT 0.5 09/30/2019 0956   GFRNONAA >60 09/30/2019 0956   GFRAA >60 09/30/2019 0956       Component Value Date/Time   WBC 16.3 (H) 09/30/2019 0956   WBC 8.9 05/08/2019 0550   RBC 3.57 (L) 09/30/2019 0956   HGB 11.8 (L) 09/30/2019 0956   HGB 13.4 01/01/2008 1334   HCT 34.3 (L) 09/30/2019 0956   HCT 38.7 01/01/2008 1334   PLT 150 09/30/2019 0956   PLT 200 01/01/2008 1334   MCV 96.1 09/30/2019 0956   MCV 94.5 01/01/2008 1334   MCH 33.1 09/30/2019 0956   MCHC 34.4 09/30/2019 0956   RDW 14.5 09/30/2019 0956   RDW 13.9 01/01/2008 1334   LYMPHSABS 0.7 09/30/2019 0956   LYMPHSABS 1.1 01/01/2008 1334   MONOABS 0.9 09/30/2019 0956   MONOABS 0.2 01/01/2008 1334   EOSABS 0.1 09/30/2019 0956   EOSABS 0.1 01/01/2008 1334   BASOSABS 0.1 09/30/2019 0956   BASOSABS 0.0 01/01/2008 1334   -------------------------------  Imaging from last 24 hours (if applicable):  Radiology interpretation: CT Chest W Contrast  Result Date: 09/16/2019 CLINICAL DATA:  Primary Cancer Type: Lymphoma Imaging Indication: Assess response to therapy Interval therapy since last imaging? Yes Initial Cancer Diagnosis Date: 05/08/2019; Established by: Biopsy-proven Detailed Pathology: Stage IV diffuse large B-cell non-Hodgkin lymphoma. Primary Tumor location: Right hilar lung mass; left supraclavicular adenopathy. Chemotherapy: Yes, R-CHOP; Ongoing? Yes; Most recent administration: 09/02/2019 Immunotherapy?  Yes; Type: Neulasta; Ongoing? Yes EXAM: CT CHEST, ABDOMEN, AND PELVIS WITH CONTRAST TECHNIQUE: Multidetector CT imaging of the chest, abdomen and pelvis was performed following the standard protocol during bolus administration of intravenous contrast. CONTRAST:  174mL OMNIPAQUE IOHEXOL 300 MG/ML SOLN, additional oral enteric contrast COMPARISON:  Most recent CT chest, abdomen and pelvis 07/17/2019. 04/18/2019 PET-CT. FINDINGS: CT CHEST FINDINGS Cardiovascular: Right chest port catheter. Normal heart size. Three-vessel  coronary artery  calcifications and/or stents. No pericardial effusion. Mediastinum/Nodes: No enlarged mediastinal, hilar, or axillary lymph nodes. Thyroid gland, trachea, and esophagus demonstrate no significant findings. Lungs/Pleura: Unchanged bandlike scarring and fibrosis of the perihilar right lung, predominantly involving the central right middle lobe (series 6, image 90). Multiple small bilateral pulmonary nodules are unchanged, the largest abutting the right hemidiaphragm, measuring 1.1 cm (series 6, image 104) and an additional index nodule in the left upper lobe measuring 6 mm (series 6, image 78). No pleural effusion or pneumothorax. Musculoskeletal: No chest wall mass or suspicious bone lesions identified. Surgical clips in the lower left neck. CT ABDOMEN PELVIS FINDINGS Hepatobiliary: No solid liver abnormality is seen. Hepatic steatosis. No gallstones, gallbladder wall thickening, or biliary dilatation. Pancreas: Unremarkable. No pancreatic ductal dilatation or surrounding inflammatory changes. Spleen: Decreased bulk of the spleen, which remains very heterogeneous in appearance, now measuring approximately 9.0 cm in greatest span, previously 9.7 cm. Adrenals/Urinary Tract: Adrenal glands are unremarkable. Kidneys are normal, without renal calculi, solid lesion, or hydronephrosis. Incidental small diverticulum of the posterior left bladder. Stomach/Bowel: Stomach is within normal limits. Appendix appears normal. No evidence of bowel wall thickening, distention, or inflammatory changes. Vascular/Lymphatic: Aortic atherosclerosis. No enlarged abdominal or pelvic lymph nodes. There is residual retroperitoneal soft tissue which is slightly decreased in thickness (series 2, image 70). Reproductive: No mass or other abnormality. Other: No abdominal wall hernia or abnormality. No abdominopelvic ascites. Musculoskeletal: No acute or significant osseous findings. IMPRESSION: 1. Unchanged post treatment bandlike  scarring and fibrosis of the perihilar right lung, predominantly involving the central right middle lobe. No residual mass or soft tissue. 2. Decreased bulk of the spleen, which remains very heterogeneous in appearance, consistent with continued treatment response. 3. There is residual retroperitoneal soft tissue which is slightly decreased in thickness, consistent with continued treatment response. 4. No residual or recurrent lymphadenopathy in the chest, abdomen, or pelvis. 5. Multiple small bilateral pulmonary nodules are unchanged and remain nonspecific. Continued attention on follow-up. 6. Hepatic steatosis. 7. Coronary artery disease.  Aortic Atherosclerosis (ICD10-I70.0). Electronically Signed   By: Eddie Candle M.D.   On: 09/16/2019 10:23   CT Abdomen Pelvis W Contrast  Result Date: 09/16/2019 CLINICAL DATA:  Primary Cancer Type: Lymphoma Imaging Indication: Assess response to therapy Interval therapy since last imaging? Yes Initial Cancer Diagnosis Date: 05/08/2019; Established by: Biopsy-proven Detailed Pathology: Stage IV diffuse large B-cell non-Hodgkin lymphoma. Primary Tumor location: Right hilar lung mass; left supraclavicular adenopathy. Chemotherapy: Yes, R-CHOP; Ongoing? Yes; Most recent administration: 09/02/2019 Immunotherapy?  Yes; Type: Neulasta; Ongoing? Yes EXAM: CT CHEST, ABDOMEN, AND PELVIS WITH CONTRAST TECHNIQUE: Multidetector CT imaging of the chest, abdomen and pelvis was performed following the standard protocol during bolus administration of intravenous contrast. CONTRAST:  139mL OMNIPAQUE IOHEXOL 300 MG/ML SOLN, additional oral enteric contrast COMPARISON:  Most recent CT chest, abdomen and pelvis 07/17/2019. 04/18/2019 PET-CT. FINDINGS: CT CHEST FINDINGS Cardiovascular: Right chest port catheter. Normal heart size. Three-vessel coronary artery calcifications and/or stents. No pericardial effusion. Mediastinum/Nodes: No enlarged mediastinal, hilar, or axillary lymph nodes.  Thyroid gland, trachea, and esophagus demonstrate no significant findings. Lungs/Pleura: Unchanged bandlike scarring and fibrosis of the perihilar right lung, predominantly involving the central right middle lobe (series 6, image 90). Multiple small bilateral pulmonary nodules are unchanged, the largest abutting the right hemidiaphragm, measuring 1.1 cm (series 6, image 104) and an additional index nodule in the left upper lobe measuring 6 mm (series 6, image 78). No pleural effusion or pneumothorax. Musculoskeletal: No chest  wall mass or suspicious bone lesions identified. Surgical clips in the lower left neck. CT ABDOMEN PELVIS FINDINGS Hepatobiliary: No solid liver abnormality is seen. Hepatic steatosis. No gallstones, gallbladder wall thickening, or biliary dilatation. Pancreas: Unremarkable. No pancreatic ductal dilatation or surrounding inflammatory changes. Spleen: Decreased bulk of the spleen, which remains very heterogeneous in appearance, now measuring approximately 9.0 cm in greatest span, previously 9.7 cm. Adrenals/Urinary Tract: Adrenal glands are unremarkable. Kidneys are normal, without renal calculi, solid lesion, or hydronephrosis. Incidental small diverticulum of the posterior left bladder. Stomach/Bowel: Stomach is within normal limits. Appendix appears normal. No evidence of bowel wall thickening, distention, or inflammatory changes. Vascular/Lymphatic: Aortic atherosclerosis. No enlarged abdominal or pelvic lymph nodes. There is residual retroperitoneal soft tissue which is slightly decreased in thickness (series 2, image 70). Reproductive: No mass or other abnormality. Other: No abdominal wall hernia or abnormality. No abdominopelvic ascites. Musculoskeletal: No acute or significant osseous findings. IMPRESSION: 1. Unchanged post treatment bandlike scarring and fibrosis of the perihilar right lung, predominantly involving the central right middle lobe. No residual mass or soft tissue. 2.  Decreased bulk of the spleen, which remains very heterogeneous in appearance, consistent with continued treatment response. 3. There is residual retroperitoneal soft tissue which is slightly decreased in thickness, consistent with continued treatment response. 4. No residual or recurrent lymphadenopathy in the chest, abdomen, or pelvis. 5. Multiple small bilateral pulmonary nodules are unchanged and remain nonspecific. Continued attention on follow-up. 6. Hepatic steatosis. 7. Coronary artery disease.  Aortic Atherosclerosis (ICD10-I70.0). Electronically Signed   By: Eddie Candle M.D.   On: 09/16/2019 10:23   ECHOCARDIOGRAM COMPLETE  Result Date: 09/05/2019    ECHOCARDIOGRAM REPORT   Patient Name:   JEMERY STACEY Date of Exam: 09/05/2019 Medical Rec #:  [id number]       Height:       67.0 in Accession #:    [id number]      Weight:       118.1 lb Date of Birth:  [birth year]       BSA:          1.617 m Patient Age:    49 years        BP:           99/72 mmHg Patient Gender: M               HR:           75 bpm. Exam Location:  [place] Procedure: 2D Echo, Cardiac Doppler, Color Doppler and Strain Analysis Indications:    Receiving treatment for Non Hodgkin's Lymphoma                 Congestive heart failure  History:        Patient has prior history of Echocardiogram examinations, most                 recent 05/16/2019. Risk Factors:Former cocaine abuse. Ischemic                 cardiomyopathy. Stroke. Coronary artery disease.  Sonographer:    Wilford Sports Rodgers-Jones RDCS Referring Phys: [id number] MARK C SKAINS IMPRESSIONS  1. Left ventricular ejection fraction, by estimation, is 40 to 45%. The left ventricle has mildly decreased function. The left ventricle demonstrates regional wall motion abnormalities (see scoring diagram/findings for description). Left ventricular diastolic parameters are indeterminate.  2. Right ventricular systolic function is normal. The right ventricular size is normal. There is normal  pulmonary artery systolic  pressure.  3. The mitral valve is normal in structure. Trivial mitral valve regurgitation. No evidence of mitral stenosis.  4. The aortic valve is tricuspid. Aortic valve regurgitation is not visualized. No aortic stenosis is present.  5. Aortic dilatation noted. There is borderline dilatation of the ascending aorta measuring 37 mm.  6. The inferior vena cava is normal in size with greater than 50% respiratory variability, suggesting right atrial pressure of 3 mmHg. FINDINGS  Left Ventricle: Left ventricular ejection fraction, by estimation, is 40 to 45%. The left ventricle has mildly decreased function. The left ventricle demonstrates regional wall motion abnormalities. Severe hypokinesis of the left ventricular, mid-apical  anteroseptal wall, apical segment, anterior wall and anterolateral wall. The left ventricular internal cavity size was normal in size. There is no left ventricular hypertrophy. Left ventricular diastolic parameters are indeterminate. Right Ventricle: The right ventricular size is normal. No increase in right ventricular wall thickness. Right ventricular systolic function is normal. There is normal pulmonary artery systolic pressure. The tricuspid regurgitant velocity is 1.89 m/s, and  with an assumed right atrial pressure of 3 mmHg, the estimated right ventricular systolic pressure is 29.5 mmHg. Left Atrium: Left atrial size was normal in size. Right Atrium: Right atrial size was normal in size. Pericardium: There is no evidence of pericardial effusion. Mitral Valve: The mitral valve is normal in structure. There is mild thickening of the mitral valve leaflet(s). Trivial mitral valve regurgitation. No evidence of mitral valve stenosis. Tricuspid Valve: The tricuspid valve is normal in structure. Tricuspid valve regurgitation is trivial. No evidence of tricuspid stenosis. Aortic Valve: The aortic valve is tricuspid. Aortic valve regurgitation is not visualized. No  aortic stenosis is present. Pulmonic Valve: The pulmonic valve was not well visualized. Pulmonic valve regurgitation is not visualized. No evidence of pulmonic stenosis. Aorta: Aortic dilatation noted. There is borderline dilatation of the ascending aorta measuring 37 mm. Venous: The inferior vena cava is normal in size with greater than 50% respiratory variability, suggesting right atrial pressure of 3 mmHg. IAS/Shunts: No atrial level shunt detected by color flow Doppler.  LEFT VENTRICLE PLAX 2D LVIDd:         4.70 cm  Diastology LVIDs:         3.70 cm  LV e' lateral:   5.00 cm/s LV PW:         0.90 cm  LV E/e' lateral: 8.2 LV IVS:        0.90 cm  LV e' medial:    5.66 cm/s LVOT diam:     2.40 cm  LV E/e' medial:  7.3 LV SV:         69 LV SV Index:   43 LVOT Area:     4.52 cm  RIGHT VENTRICLE RV Basal diam:  4.00 cm RV S prime:     13.40 cm/s TAPSE (M-mode): 1.7 cm LEFT ATRIUM             Index       RIGHT ATRIUM           Index LA diam:        3.70 cm 2.29 cm/m  RA Area:     11.50 cm LA Vol (A2C):   34.0 ml 21.03 ml/m RA Volume:   30.20 ml  18.68 ml/m LA Vol (A4C):   27.5 ml 17.01 ml/m LA Biplane Vol: 30.9 ml 19.11 ml/m  AORTIC VALVE LVOT Vmax:   64.60 cm/s LVOT Vmean:  51.900 cm/s LVOT VTI:  0.153 m  AORTA Ao Root diam: 3.80 cm Ao Asc diam:  3.70 cm MITRAL VALVE               TRICUSPID VALVE MV Area (PHT): 2.80 cm    TR Peak grad:   14.3 mmHg MV Decel Time: 271 msec    TR Vmax:        189.00 cm/s MV E velocity: 41.10 cm/s MV A velocity: 67.70 cm/s  SHUNTS MV E/A ratio:  0.61        Systemic VTI:  0.15 m                            Systemic Diam: 2.40 cm Buford Dresser MD Electronically signed by Buford Dresser MD Signature Date/Time: 09/05/2019/5:36:53 PM    Final

## 2019-10-03 ENCOUNTER — Non-Acute Institutional Stay: Payer: Medicaid Other | Admitting: Hospice

## 2019-10-03 ENCOUNTER — Other Ambulatory Visit: Payer: Self-pay

## 2019-10-03 DIAGNOSIS — Z515 Encounter for palliative care: Secondary | ICD-10-CM

## 2019-10-03 NOTE — Progress Notes (Signed)
[facility] Consult Note Telephone: [phone]  Fax: [phone]  PATIENT NAME: [name] DOB: 20-Oct-1955 MRN: [id number]  PRIMARY CARE PROVIDER:   Elsie Stain, MD  REFERRING PROVIDER: Dr. Charlott Navarro RESPONSIBLE PARTY:  Self/[name] [phone].     RECOMMENDATIONS/PLAN:   Advance Care Planning/Goals of Care: Visit at the request of Dr. Charlott Navarro  for palliative consult. Visit consisted of building trust and discussions on Palliative Medicine as specialized medical care for people living with serious illness, aimed at facilitating better quality of life through symptoms relief, assisting with advance care plan and establishing goals of care.  Patient said he wanted to continue with chemo and would like to go to the hospital when needed but will like to be a DO NOT RESUSCITATE.  DNR form signed today for patient and handed over to director of clinical services in the facility;  goals of care include to maximize quality of life and symptom management.  Discussion with [name] who affirmed patient's DO NOT RESUSCITATE CODE STATUS; she said she will visit patient today to further confirm and would reach out to NP if patient changes his mind.  Ample emotional support provided.  Palliative care will continue to support patient/family and the medical team. 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  He denies nausea or vomiting, in no acute distress.  He said he had poor appetite but has gained back his appetite and eats up to 75% of his meals. Next chemo 10/14/2019. Chart review indicates patient with Serous conjuctivitis of R eye resolving with gentamicin ointment treatment.  No coughing, no shortness of breath.  No pain during visit.  Nursing staff with no concerns at this time.  Encouraged ongoing nursing  care. Follow up: Palliative care will continue to follow patient for goals of care clarification and symptom management. I spent 1 hour and 20 minutes providing this consultation; time iincludes time spent with patient/family, chart review, provider coordination,  and documentation. More than 50% of the time in this consultation was spent on coordinating communication  HISTORY OF PRESENT ILLNESS:  [name] is a 64 y.o. year old male with multiple medical problems including Diffuse large B-cell lymphoma of lymph nodes of multiple regions , CAD, CHF stroke. Palliative Care was asked to help address goals of care.   CODE STATUS: DNR  PPS: 70% HOSPICE ELIGIBILITY/DIAGNOSIS: TBD  PAST MEDICAL HISTORY:  Past Medical History:  Diagnosis Date   CAD (coronary artery disease)    a. 04/2008 Cath: LM nl, LAD 50p, 18m, LCX min irregs, RCA 50p/m, 40d, RPDA 60-70ost; b. 04/2008 CABG x 4: LIMA->LAD, VG->D2, VG->RPDA->RPL.   Cancer ([place])    Cocaine abuse ([place])    a. Quit early 2019.   Ischemic cardiomyopathy    a. LV gram: EF 30-35%, apical/periapical AK.   Stroke [facility]) 02/2019   Stroke ([place]) 02/2019   Tobacco abuse     SOCIAL HX:  Social History   Tobacco Use   Smoking status: Former Smoker    Packs/day: 0.30    Types: Cigarettes   Smokeless tobacco: Current User   Tobacco comment: has smoked for 40+ yrs - at most 1ppd, currently 1ppwk.  Substance Use Topics   Alcohol use: Not Currently    Comment: quite > 30 yrs ago.    ALLERGIES: No Known Allergies   PERTINENT MEDICATIONS:  Outpatient  Encounter Medications as of 10/03/2019  Medication Sig   acetaminophen (TYLENOL) 325 MG tablet Take 2 tablets (650 mg total) by mouth every 4 (four) hours as needed for mild pain (or temp > 37.5 C (99.5 F)).   allopurinol (ZYLOPRIM) 100 MG tablet TAKE 1 TABLET (100 MG TOTAL) BY MOUTH 2 (TWO) TIMES DAILY.   aspirin EC 81 MG tablet Take 1 tablet (81 mg total) by mouth daily.   docusate  sodium (COLACE) 100 MG capsule Take 1 capsule (100 mg total) by mouth every 12 (twelve) hours.   gentamicin ointment (GARAMYCIN) 0.1 % Apply 1 application topically 3 (three) times daily.   lidocaine-prilocaine (EMLA) cream Apply 1 application topically as needed. Apply a teaspoon amount to port site an hour prior to chemo therapy, DO NOT rub in, cover with plastic wrap.   oxyCODONE-acetaminophen (PERCOCET/ROXICET) 5-325 MG tablet Take 1 tablet by mouth every 6 (six) hours as needed for severe pain.   polyethylene glycol (MIRALAX / GLYCOLAX) 17 g packet Take 17 g by mouth daily.   predniSONE (DELTASONE) 50 MG tablet 2 tablet p.o. daily for 5 days start with the first day of every chemotherapy cycle.   prochlorperazine (COMPAZINE) 10 MG tablet Take 1 tablet (10 mg total) by mouth every 6 (six) hours as needed for nausea or vomiting.   No facility-administered encounter medications on file as of 10/03/2019.    PHYSICAL EXAM/ROS:  General: NAD, frail appearing, thin, cooperative Cardiovascular: regular rate and rhythm; denies chest pain Pulmonary: clear ant/post fields; no adventitious lung sounds on auscultation Abdomen: soft, nontender, + bowel sounds GU: no suprapubic tenderness Extremities: no edema, no joint deformities Skin: no rashes to exposed skin Neurological: Weakness but otherwise nonfocal  Teodoro Spray, NP

## 2019-10-07 ENCOUNTER — Inpatient Hospital Stay: Payer: Medicaid Other

## 2019-10-07 ENCOUNTER — Other Ambulatory Visit: Payer: Self-pay

## 2019-10-07 ENCOUNTER — Other Ambulatory Visit: Payer: Self-pay | Admitting: Medical Oncology

## 2019-10-07 DIAGNOSIS — Z95828 Presence of other vascular implants and grafts: Secondary | ICD-10-CM

## 2019-10-07 DIAGNOSIS — C8338 Diffuse large B-cell lymphoma, lymph nodes of multiple sites: Secondary | ICD-10-CM

## 2019-10-07 DIAGNOSIS — Z5112 Encounter for antineoplastic immunotherapy: Secondary | ICD-10-CM | POA: Diagnosis not present

## 2019-10-07 LAB — CMP (CANCER CENTER ONLY)
ALT: 21 U/L (ref 0–44)
AST: 14 U/L — ABNORMAL LOW (ref 15–41)
Albumin: 3.6 g/dL (ref 3.5–5.0)
Alkaline Phosphatase: 105 U/L (ref 38–126)
Anion gap: 8 (ref 5–15)
BUN: 13 mg/dL (ref 8–23)
CO2: 24 mmol/L (ref 22–32)
Calcium: 9.2 mg/dL (ref 8.9–10.3)
Chloride: 106 mmol/L (ref 98–111)
Creatinine: 0.77 mg/dL (ref 0.61–1.24)
GFR, Est AFR Am: >60 mL/min (ref >60)
GFR, Estimated: >60 mL/min (ref >60)
Glucose, Bld: 130 mg/dL — ABNORMAL HIGH (ref 70–99)
Potassium: 3.6 mmol/L (ref 3.5–5.1)
Sodium: 138 mmol/L (ref 135–145)
Total Bilirubin: <0.2 mg/dL — ABNORMAL LOW (ref 0.3–1.2)
Total Protein: 6.7 g/dL (ref 6.5–8.1)

## 2019-10-07 LAB — CBC WITH DIFFERENTIAL (CANCER CENTER ONLY)
Abs Immature Granulocytes: 0.13 10*3/uL — ABNORMAL HIGH (ref 0.00–0.07)
Basophils Absolute: 0.1 10*3/uL (ref 0.0–0.1)
Basophils Relative: 1 %
Eosinophils Absolute: 0 10*3/uL (ref 0.0–0.5)
Eosinophils Relative: 0 %
HCT: 37.2 % — ABNORMAL LOW (ref 39.0–52.0)
Hemoglobin: 12.5 g/dL — ABNORMAL LOW (ref 13.0–17.0)
Immature Granulocytes: 2 %
Lymphocytes Relative: 7 %
Lymphs Abs: 0.7 10*3/uL (ref 0.7–4.0)
MCH: 32.4 pg (ref 26.0–34.0)
MCHC: 33.6 g/dL (ref 30.0–36.0)
MCV: 96.4 fL (ref 80.0–100.0)
Monocytes Absolute: 0.5 10*3/uL (ref 0.1–1.0)
Monocytes Relative: 5 %
Neutro Abs: 7.6 10*3/uL (ref 1.7–7.7)
Neutrophils Relative %: 85 %
Platelet Count: 213 10*3/uL (ref 150–400)
RBC: 3.86 MIL/uL — ABNORMAL LOW (ref 4.22–5.81)
RDW: 14.8 % (ref 11.5–15.5)
WBC Count: 8.9 10*3/uL (ref 4.0–10.5)
nRBC: 0 % (ref 0.0–0.2)

## 2019-10-07 MED ORDER — SODIUM CHLORIDE 0.9% FLUSH
10.0000 mL | Freq: Once | INTRAVENOUS | Status: AC
  Administered 2019-10-07: 10 mL
  Filled 2019-10-07: qty 10

## 2019-10-07 MED ORDER — HEPARIN SOD (PORK) LOCK FLUSH 100 UNIT/ML IV SOLN
500.0000 [IU] | Freq: Once | INTRAVENOUS | Status: AC
  Administered 2019-10-07: 500 [IU]
  Filled 2019-10-07: qty 5

## 2019-10-07 NOTE — Telephone Encounter (Signed)
I called [facility] to deliver refill of  Allopurinol #1 of 2 to pt.

## 2019-10-13 ENCOUNTER — Telehealth: Payer: Self-pay

## 2019-10-13 NOTE — Telephone Encounter (Signed)
Updated medication orders faxed to [facility]

## 2019-10-14 ENCOUNTER — Inpatient Hospital Stay: Payer: Medicaid Other

## 2019-10-14 ENCOUNTER — Encounter: Payer: Self-pay | Admitting: Internal Medicine

## 2019-10-14 ENCOUNTER — Other Ambulatory Visit: Payer: Self-pay

## 2019-10-14 ENCOUNTER — Other Ambulatory Visit: Payer: Self-pay | Admitting: Medical Oncology

## 2019-10-14 ENCOUNTER — Inpatient Hospital Stay (HOSPITAL_BASED_OUTPATIENT_CLINIC_OR_DEPARTMENT_OTHER): Payer: Medicaid Other | Admitting: Internal Medicine

## 2019-10-14 VITALS — BP 120/90 | HR 77 | Temp 97.8°F | Resp 18

## 2019-10-14 VITALS — BP 99/79 | HR 76 | Temp 98.2°F | Resp 18 | Ht 67.0 in | Wt 119.7 lb

## 2019-10-14 DIAGNOSIS — C8338 Diffuse large B-cell lymphoma, lymph nodes of multiple sites: Secondary | ICD-10-CM

## 2019-10-14 DIAGNOSIS — Z5111 Encounter for antineoplastic chemotherapy: Secondary | ICD-10-CM

## 2019-10-14 DIAGNOSIS — Z5112 Encounter for antineoplastic immunotherapy: Secondary | ICD-10-CM | POA: Diagnosis not present

## 2019-10-14 DIAGNOSIS — I10 Essential (primary) hypertension: Secondary | ICD-10-CM

## 2019-10-14 DIAGNOSIS — Z95828 Presence of other vascular implants and grafts: Secondary | ICD-10-CM

## 2019-10-14 DIAGNOSIS — H10231 Serous conjunctivitis, except viral, right eye: Secondary | ICD-10-CM

## 2019-10-14 LAB — CBC WITH DIFFERENTIAL (CANCER CENTER ONLY)
Abs Immature Granulocytes: 0.02 10*3/uL (ref 0.00–0.07)
Basophils Absolute: 0.1 10*3/uL (ref 0.0–0.1)
Basophils Relative: 3 %
Eosinophils Absolute: 0.1 10*3/uL (ref 0.0–0.5)
Eosinophils Relative: 2 %
HCT: 39 % (ref 39.0–52.0)
Hemoglobin: 13.1 g/dL (ref 13.0–17.0)
Immature Granulocytes: 1 %
Lymphocytes Relative: 15 %
Lymphs Abs: 0.6 10*3/uL — ABNORMAL LOW (ref 0.7–4.0)
MCH: 32.3 pg (ref 26.0–34.0)
MCHC: 33.6 g/dL (ref 30.0–36.0)
MCV: 96.3 fL (ref 80.0–100.0)
Monocytes Absolute: 0.7 10*3/uL (ref 0.1–1.0)
Monocytes Relative: 18 %
Neutro Abs: 2.5 10*3/uL (ref 1.7–7.7)
Neutrophils Relative %: 61 %
Platelet Count: 342 10*3/uL (ref 150–400)
RBC: 4.05 MIL/uL — ABNORMAL LOW (ref 4.22–5.81)
RDW: 14.6 % (ref 11.5–15.5)
WBC Count: 4 10*3/uL (ref 4.0–10.5)
nRBC: 0 % (ref 0.0–0.2)

## 2019-10-14 LAB — CMP (CANCER CENTER ONLY)
ALT: 17 U/L (ref 0–44)
AST: 15 U/L (ref 15–41)
Albumin: 3.7 g/dL (ref 3.5–5.0)
Alkaline Phosphatase: 90 U/L (ref 38–126)
Anion gap: 7 (ref 5–15)
BUN: 15 mg/dL (ref 8–23)
CO2: 25 mmol/L (ref 22–32)
Calcium: 9.2 mg/dL (ref 8.9–10.3)
Chloride: 106 mmol/L (ref 98–111)
Creatinine: 0.8 mg/dL (ref 0.61–1.24)
GFR, Est AFR Am: >60 mL/min (ref >60)
GFR, Estimated: >60 mL/min (ref >60)
Glucose, Bld: 115 mg/dL — ABNORMAL HIGH (ref 70–99)
Potassium: 3.7 mmol/L (ref 3.5–5.1)
Sodium: 138 mmol/L (ref 135–145)
Total Bilirubin: 0.4 mg/dL (ref 0.3–1.2)
Total Protein: 6.8 g/dL (ref 6.5–8.1)

## 2019-10-14 LAB — URIC ACID: Uric Acid, Serum: 3.3 mg/dL — ABNORMAL LOW (ref 3.7–8.6)

## 2019-10-14 MED ORDER — SODIUM CHLORIDE 0.9 % IV SOLN
Freq: Once | INTRAVENOUS | Status: AC
  Filled 2019-10-14: qty 250

## 2019-10-14 MED ORDER — PALONOSETRON HCL INJECTION 0.25 MG/5ML
0.2500 mg | Freq: Once | INTRAVENOUS | Status: AC
  Administered 2019-10-14: 0.25 mg via INTRAVENOUS

## 2019-10-14 MED ORDER — HEPARIN SOD (PORK) LOCK FLUSH 100 UNIT/ML IV SOLN
500.0000 [IU] | Freq: Once | INTRAVENOUS | Status: AC | PRN
  Administered 2019-10-14: 500 [IU]
  Filled 2019-10-14: qty 5

## 2019-10-14 MED ORDER — VINCRISTINE SULFATE CHEMO INJECTION 1 MG/ML
2.0000 mg | Freq: Once | INTRAVENOUS | Status: AC
  Administered 2019-10-14: 2 mg via INTRAVENOUS
  Filled 2019-10-14: qty 2

## 2019-10-14 MED ORDER — DIPHENHYDRAMINE HCL 25 MG PO CAPS
50.0000 mg | ORAL_CAPSULE | Freq: Once | ORAL | Status: AC
  Administered 2019-10-14: 50 mg via ORAL

## 2019-10-14 MED ORDER — SODIUM CHLORIDE 0.9% FLUSH
10.0000 mL | INTRAVENOUS | Status: DC | PRN
  Administered 2019-10-14: 10 mL
  Filled 2019-10-14: qty 10

## 2019-10-14 MED ORDER — ACETAMINOPHEN 325 MG PO TABS
650.0000 mg | ORAL_TABLET | Freq: Once | ORAL | Status: AC
  Administered 2019-10-14: 650 mg via ORAL

## 2019-10-14 MED ORDER — DIPHENHYDRAMINE HCL 25 MG PO CAPS
ORAL_CAPSULE | ORAL | Status: AC
  Filled 2019-10-14: qty 2

## 2019-10-14 MED ORDER — SODIUM CHLORIDE 0.9 % IV SOLN
150.0000 mg | Freq: Once | INTRAVENOUS | Status: AC
  Administered 2019-10-14: 150 mg via INTRAVENOUS
  Filled 2019-10-14: qty 150

## 2019-10-14 MED ORDER — SODIUM CHLORIDE 0.9 % IV SOLN
10.0000 mg | Freq: Once | INTRAVENOUS | Status: AC
  Administered 2019-10-14: 10 mg via INTRAVENOUS
  Filled 2019-10-14: qty 10

## 2019-10-14 MED ORDER — DOXORUBICIN HCL CHEMO IV INJECTION 2 MG/ML
25.0000 mg/m2 | Freq: Once | INTRAVENOUS | Status: AC
  Administered 2019-10-14: 38 mg via INTRAVENOUS
  Filled 2019-10-14: qty 19

## 2019-10-14 MED ORDER — ACETAMINOPHEN 325 MG PO TABS
ORAL_TABLET | ORAL | Status: AC
  Filled 2019-10-14: qty 2

## 2019-10-14 MED ORDER — SODIUM CHLORIDE 0.9% FLUSH
10.0000 mL | Freq: Once | INTRAVENOUS | Status: AC
  Administered 2019-10-14: 10 mL
  Filled 2019-10-14: qty 10

## 2019-10-14 MED ORDER — SODIUM CHLORIDE 0.9 % IV SOLN
375.0000 mg/m2 | Freq: Once | INTRAVENOUS | Status: AC
  Administered 2019-10-14: 600 mg via INTRAVENOUS
  Filled 2019-10-14: qty 50

## 2019-10-14 MED ORDER — SODIUM CHLORIDE 0.9 % IV SOLN
750.0000 mg/m2 | Freq: Once | INTRAVENOUS | Status: AC
  Administered 2019-10-14: 1120 mg via INTRAVENOUS
  Filled 2019-10-14: qty 56

## 2019-10-14 MED ORDER — GENTAMICIN SULFATE 0.1 % EX OINT: TOPICAL_OINTMENT | Freq: Three times a day (TID) | CUTANEOUS | 0 refills | Status: DC

## 2019-10-14 MED ORDER — PALONOSETRON HCL INJECTION 0.25 MG/5ML
INTRAVENOUS | Status: AC
  Filled 2019-10-14: qty 5

## 2019-10-14 NOTE — Progress Notes (Signed)
[facility] [phone]   [phone]  OFFICE PROGRESS NOTE  Elsie Stain, MD 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    PRIOR THERAPY: None.  CURRENT THERAPY: Systemic chemotherapy with R-CHOP every 3 weeks.  First dose May 20, 2019.  Status post 7 cycles.  INTERVAL HISTORY: [name] 64 y.o. male returns to the clinic today for follow-up visit.  The patient is feeling much better today with no concerning complaints except for conjunctivitis.  He was given prescription for gentamicin eyedrops but he is not receiving it at the facility.  He denied having any current chest pain, shortness of breath, cough or hemoptysis.  He denied having any fever or chills.  He has no nausea, vomiting, diarrhea or constipation.  He has no headache or visual changes.  He is here today for evaluation before the last cycle of his systemic chemotherapy.   MEDICAL HISTORY: Past Medical History:  Diagnosis Date  . CAD (coronary artery disease)    a. 04/2008 Cath: LM nl, LAD 50p, 105m, LCX min irregs, RCA 50p/m, 40d, RPDA 60-70ost; b. 04/2008 CABG x 4: LIMA->LAD, VG->D2, VG->RPDA->RPL.  . Cancer ([place])   . Cocaine abuse ([place])    a. Quit early 2019.  . Ischemic cardiomyopathy    a. LV gram: EF 30-35%, apical/periapical AK.  . Stroke ([place]) 02/2019  . Stroke ([place]) 02/2019  . Tobacco abuse     ALLERGIES:  has No Known Allergies.  MEDICATIONS:  Current Outpatient Medications  Medication Sig Dispense Refill  . acetaminophen (TYLENOL) 325 MG tablet Take 2 tablets (650 mg  total) by mouth every 4 (four) hours as needed for mild pain (or temp > 37.5 C (99.5 F)).    Mar[place] Kitchen allopurinol (ZYLOPRIM) 100 MG tablet TAKE 1 TABLET (100 MG TOTAL) BY MOUTH 2 (TWO) TIMES DAILY. 60 tablet 2  . aspirin EC 81 MG tablet Take 1 tablet (81 mg total) by mouth daily. 30 tablet 3  . docusate sodium (COLACE) 100 MG capsule Take 1 capsule (100 mg total) by mouth every 12 (twelve) hours. 60 capsule 0  . gentamicin ointment (GARAMYCIN) 0.1 % Apply 1 application topically 3 (three) times daily. 30 g 0  . lidocaine-prilocaine (EMLA) cream Apply 1 application topically as needed. Apply a teaspoon amount to port site an hour prior to chemo therapy, DO NOT rub in, cover with plastic wrap. 30 g 0  . oxyCODONE-acetaminophen (PERCOCET/ROXICET) 5-325 MG tablet Take 1 tablet by mouth every 6 (six) hours as needed for severe pain. 30 tablet 0  . polyethylene glycol (MIRALAX / GLYCOLAX) 17 g packet Take 17 g by mouth daily. 14 each 0  . predniSONE (DELTASONE) 50 MG tablet 2 tablet p.o. daily for 5 days start with the first day of every chemotherapy cycle. 80 tablet 0  . prochlorperazine (COMPAZINE) 10 MG tablet Take 1 tablet (10 mg total) by mouth every 6 (six) hours as needed for nausea or vomiting. 30 tablet 2   No current facility-administered medications for this visit.    SURGICAL HISTORY:  Past Surgical History:  Procedure Laterality Date  .  BYPASS GRAFT  2010  . CARDIAC SURGERY  2010  . IR IMAGING GUIDED PORT INSERTION  05/26/2019  . SUPRACLAVICAL NODE BIOPSY Left 05/08/2019   Procedure: SUPRACLAVICAL NODE BIOPSY;  Surgeon: [name], MD;  Location: [facility];  Service: Thoracic;  Laterality: Left;    REVIEW OF SYSTEMS:  A comprehensive review of systems was negative except for: Eyes: positive for irritation   PHYSICAL EXAMINATION: General appearance: alert, cooperative, fatigued and no distress Head: Normocephalic, without obvious abnormality, atraumatic Neck: no adenopathy, no JVD,  supple, symmetrical, trachea midline and thyroid not enlarged, symmetric, no tenderness/mass/nodules Lymph nodes: Cervical, supraclavicular, and axillary nodes normal. Resp: clear to auscultation bilaterally Back: symmetric, no curvature. ROM normal. No CVA tenderness. Cardio: regular rate and rhythm, S1, S2 normal, no murmur, click, rub or gallop GI: soft, non-tender; bowel sounds normal; no masses,  no organomegaly Extremities: extremities normal, atraumatic, no cyanosis or edema  ECOG PERFORMANCE STATUS: 1 - Symptomatic but completely ambulatory  Blood pressure 99/79, pulse 76, temperature 98.2 F (36.8 C), temperature source Temporal, resp. rate 18, height 5\' 7"  (1.702 m), weight 119 lb 11.2 oz (54.3 kg), SpO2 99 %.  LABORATORY DATA: Lab Results  Component Value Date   WBC 4.0 10/14/2019   HGB 13.1 10/14/2019   HCT 39.0 10/14/2019   MCV 96.3 10/14/2019   PLT 342 10/14/2019      Chemistry      Component Value Date/Time   NA 138 10/14/2019 1040   K 3.7 10/14/2019 1040   CL 106 10/14/2019 1040   CO2 25 10/14/2019 1040   BUN 15 10/14/2019 1040   CREATININE 0.80 10/14/2019 1040      Component Value Date/Time   CALCIUM 9.2 10/14/2019 1040   ALKPHOS 90 10/14/2019 1040   AST 15 10/14/2019 1040   ALT 17 10/14/2019 1040   BILITOT 0.4 10/14/2019 1040       RADIOGRAPHIC STUDIES: CT Chest W Contrast  Result Date: 09/16/2019 CLINICAL DATA:  Primary Cancer Type: Lymphoma Imaging Indication: Assess response to therapy Interval therapy since last imaging? Yes Initial Cancer Diagnosis Date: 05/08/2019; Established by: Biopsy-proven Detailed Pathology: Stage IV diffuse large B-cell non-Hodgkin lymphoma. Primary Tumor location: Right hilar lung mass; left supraclavicular adenopathy. Chemotherapy: Yes, R-CHOP; Ongoing? Yes; Most recent administration: 09/02/2019 Immunotherapy?  Yes; Type: Neulasta; Ongoing? Yes EXAM: CT CHEST, ABDOMEN, AND PELVIS WITH CONTRAST TECHNIQUE: Multidetector CT  imaging of the chest, abdomen and pelvis was performed following the standard protocol during bolus administration of intravenous contrast. CONTRAST:  167mL OMNIPAQUE IOHEXOL 300 MG/ML SOLN, additional oral enteric contrast COMPARISON:  Most recent CT chest, abdomen and pelvis 07/17/2019. 04/18/2019 PET-CT. FINDINGS: CT CHEST FINDINGS Cardiovascular: Right chest port catheter. Normal heart size. Three-vessel coronary artery calcifications and/or stents. No pericardial effusion. Mediastinum/Nodes: No enlarged mediastinal, hilar, or axillary lymph nodes. Thyroid gland, trachea, and esophagus demonstrate no significant findings. Lungs/Pleura: Unchanged bandlike scarring and fibrosis of the perihilar right lung, predominantly involving the central right middle lobe (series 6, image 90). Multiple small bilateral pulmonary nodules are unchanged, the largest abutting the right hemidiaphragm, measuring 1.1 cm (series 6, image 104) and an additional index nodule in the left upper lobe measuring 6 mm (series 6, image 78). No pleural effusion or pneumothorax. Musculoskeletal: No chest wall mass or suspicious bone lesions identified. Surgical clips in the lower left neck. CT ABDOMEN PELVIS FINDINGS Hepatobiliary: No solid liver abnormality is seen. Hepatic steatosis. No gallstones, gallbladder wall thickening, or biliary dilatation. Pancreas: Unremarkable. No pancreatic  ductal dilatation or surrounding inflammatory changes. Spleen: Decreased bulk of the spleen, which remains very heterogeneous in appearance, now measuring approximately 9.0 cm in greatest span, previously 9.7 cm. Adrenals/Urinary Tract: Adrenal glands are unremarkable. Kidneys are normal, without renal calculi, solid lesion, or hydronephrosis. Incidental small diverticulum of the posterior left bladder. Stomach/Bowel: Stomach is within normal limits. Appendix appears normal. No evidence of bowel wall thickening, distention, or inflammatory changes.  Vascular/Lymphatic: Aortic atherosclerosis. No enlarged abdominal or pelvic lymph nodes. There is residual retroperitoneal soft tissue which is slightly decreased in thickness (series 2, image 70). Reproductive: No mass or other abnormality. Other: No abdominal wall hernia or abnormality. No abdominopelvic ascites. Musculoskeletal: No acute or significant osseous findings. IMPRESSION: 1. Unchanged post treatment bandlike scarring and fibrosis of the perihilar right lung, predominantly involving the central right middle lobe. No residual mass or soft tissue. 2. Decreased bulk of the spleen, which remains very heterogeneous in appearance, consistent with continued treatment response. 3. There is residual retroperitoneal soft tissue which is slightly decreased in thickness, consistent with continued treatment response. 4. No residual or recurrent lymphadenopathy in the chest, abdomen, or pelvis. 5. Multiple small bilateral pulmonary nodules are unchanged and remain nonspecific. Continued attention on follow-up. 6. Hepatic steatosis. 7. Coronary artery disease.  Aortic Atherosclerosis (ICD10-I70.0). Electronically Signed   By: Eddie Candle M.D.   On: 09/16/2019 10:23   CT Abdomen Pelvis W Contrast  Result Date: 09/16/2019 CLINICAL DATA:  Primary Cancer Type: Lymphoma Imaging Indication: Assess response to therapy Interval therapy since last imaging? Yes Initial Cancer Diagnosis Date: 05/08/2019; Established by: Biopsy-proven Detailed Pathology: Stage IV diffuse large B-cell non-Hodgkin lymphoma. Primary Tumor location: Right hilar lung mass; left supraclavicular adenopathy. Chemotherapy: Yes, R-CHOP; Ongoing? Yes; Most recent administration: 09/02/2019 Immunotherapy?  Yes; Type: Neulasta; Ongoing? Yes EXAM: CT CHEST, ABDOMEN, AND PELVIS WITH CONTRAST TECHNIQUE: Multidetector CT imaging of the chest, abdomen and pelvis was performed following the standard protocol during bolus administration of intravenous contrast.  CONTRAST:  157mL OMNIPAQUE IOHEXOL 300 MG/ML SOLN, additional oral enteric contrast COMPARISON:  Most recent CT chest, abdomen and pelvis 07/17/2019. 04/18/2019 PET-CT. FINDINGS: CT CHEST FINDINGS Cardiovascular: Right chest port catheter. Normal heart size. Three-vessel coronary artery calcifications and/or stents. No pericardial effusion. Mediastinum/Nodes: No enlarged mediastinal, hilar, or axillary lymph nodes. Thyroid gland, trachea, and esophagus demonstrate no significant findings. Lungs/Pleura: Unchanged bandlike scarring and fibrosis of the perihilar right lung, predominantly involving the central right middle lobe (series 6, image 90). Multiple small bilateral pulmonary nodules are unchanged, the largest abutting the right hemidiaphragm, measuring 1.1 cm (series 6, image 104) and an additional index nodule in the left upper lobe measuring 6 mm (series 6, image 78). No pleural effusion or pneumothorax. Musculoskeletal: No chest wall mass or suspicious bone lesions identified. Surgical clips in the lower left neck. CT ABDOMEN PELVIS FINDINGS Hepatobiliary: No solid liver abnormality is seen. Hepatic steatosis. No gallstones, gallbladder wall thickening, or biliary dilatation. Pancreas: Unremarkable. No pancreatic ductal dilatation or surrounding inflammatory changes. Spleen: Decreased bulk of the spleen, which remains very heterogeneous in appearance, now measuring approximately 9.0 cm in greatest span, previously 9.7 cm. Adrenals/Urinary Tract: Adrenal glands are unremarkable. Kidneys are normal, without renal calculi, solid lesion, or hydronephrosis. Incidental small diverticulum of the posterior left bladder. Stomach/Bowel: Stomach is within normal limits. Appendix appears normal. No evidence of bowel wall thickening, distention, or inflammatory changes. Vascular/Lymphatic: Aortic atherosclerosis. No enlarged abdominal or pelvic lymph nodes. There is residual retroperitoneal soft tissue which is slightly  decreased in thickness (series 2, image 70). Reproductive: No mass or other abnormality. Other: No abdominal wall hernia or abnormality. No abdominopelvic ascites. Musculoskeletal: No acute or significant osseous findings. IMPRESSION: 1. Unchanged post treatment bandlike scarring and fibrosis of the perihilar right lung, predominantly involving the central right middle lobe. No residual mass or soft tissue. 2. Decreased bulk of the spleen, which remains very heterogeneous in appearance, consistent with continued treatment response. 3. There is residual retroperitoneal soft tissue which is slightly decreased in thickness, consistent with continued treatment response. 4. No residual or recurrent lymphadenopathy in the chest, abdomen, or pelvis. 5. Multiple small bilateral pulmonary nodules are unchanged and remain nonspecific. Continued attention on follow-up. 6. Hepatic steatosis. 7. Coronary artery disease.  Aortic Atherosclerosis (ICD10-I70.0). Electronically Signed   By: Eddie Candle M.D.   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  The patient is currently undergoing systemic chemotherapy with R-CHOP status post 7 cycles.  The patient has been tolerating this treatment well with no concerning adverse effects. I recommended for him to proceed with cycle #8 today as planned. He will come back for follow-up visit in 1 months for evaluation with repeat blood work as well as PET scan for restaging of his disease after completion of the chemotherapy. For the conjunctivitis, he will continue with the gentamicin eyedrops. For the peripheral neuropathy we will continue to monitor  for now. He was advised to call immediately if he has any concerning symptoms in the interval. The patient voices understanding of current disease status and treatment options and is in agreement with the current care plan.  All questions were answered. The patient knows to call the clinic with any problems, questions or concerns. We can certainly see the patient much sooner if necessary.   Disclaimer: This note was dictated with voice recognition software. Similar sounding words can inadvertently be transcribed and may not be corrected upon review.

## 2019-10-14 NOTE — Patient Instructions (Signed)
[facility] Discharge Instructions for Patients Receiving Chemotherapy  Today you received the following chemotherapy agents Adriamycin, Vincristine, Cytoxan and Rituxan  To help prevent nausea and vomiting after your treatment, we encourage you to take your nausea medication as directed.    If you develop nausea and vomiting that is not controlled by your nausea medication, call the clinic.   BELOW ARE SYMPTOMS THAT SHOULD BE REPORTED IMMEDIATELY:  *FEVER GREATER THAN 100.5 F  *CHILLS WITH OR WITHOUT FEVER  NAUSEA AND VOMITING THAT IS NOT CONTROLLED WITH YOUR NAUSEA MEDICATION  *UNUSUAL SHORTNESS OF BREATH  *UNUSUAL BRUISING OR BLEEDING  TENDERNESS IN MOUTH AND THROAT WITH OR WITHOUT PRESENCE OF ULCERS  *URINARY PROBLEMS  *BOWEL PROBLEMS  UNUSUAL RASH Items with * indicate a potential emergency and should be followed up as soon as possible.  Feel free to call the clinic should you have any questions or concerns. The clinic phone number is [phone].  Please show the [facility] at check-in to the Emergency Department and triage nurse.

## 2019-10-15 ENCOUNTER — Telehealth: Payer: Self-pay | Admitting: Internal Medicine

## 2019-10-15 NOTE — Telephone Encounter (Signed)
Scheduled per los. Called and left msg. Mailed printout  °

## 2019-10-16 ENCOUNTER — Inpatient Hospital Stay: Payer: Medicaid Other

## 2019-10-16 ENCOUNTER — Other Ambulatory Visit: Payer: Self-pay

## 2019-10-16 VITALS — BP 93/68 | HR 71 | Temp 97.6°F | Resp 18

## 2019-10-16 DIAGNOSIS — C8338 Diffuse large B-cell lymphoma, lymph nodes of multiple sites: Secondary | ICD-10-CM

## 2019-10-16 DIAGNOSIS — Z5112 Encounter for antineoplastic immunotherapy: Secondary | ICD-10-CM | POA: Diagnosis not present

## 2019-10-16 MED ORDER — PEGFILGRASTIM-JMDB 6 MG/0.6ML ~~LOC~~ SOSY
6.0000 mg | PREFILLED_SYRINGE | Freq: Once | SUBCUTANEOUS | Status: AC
  Administered 2019-10-16: 6 mg via SUBCUTANEOUS

## 2019-10-16 MED ORDER — PEGFILGRASTIM-JMDB 6 MG/0.6ML ~~LOC~~ SOSY
PREFILLED_SYRINGE | SUBCUTANEOUS | Status: AC
  Filled 2019-10-16: qty 0.6

## 2019-10-16 NOTE — Progress Notes (Signed)
Notified dr of low blood pressure reading. Administered fulphila per dr order.

## 2019-10-21 ENCOUNTER — Inpatient Hospital Stay: Payer: Medicaid Other

## 2019-10-21 ENCOUNTER — Other Ambulatory Visit: Payer: Self-pay

## 2019-10-21 DIAGNOSIS — Z95828 Presence of other vascular implants and grafts: Secondary | ICD-10-CM

## 2019-10-21 DIAGNOSIS — Z5112 Encounter for antineoplastic immunotherapy: Secondary | ICD-10-CM | POA: Diagnosis not present

## 2019-10-21 DIAGNOSIS — C8338 Diffuse large B-cell lymphoma, lymph nodes of multiple sites: Secondary | ICD-10-CM

## 2019-10-21 LAB — CBC WITH DIFFERENTIAL (CANCER CENTER ONLY)
Abs Immature Granulocytes: 0.59 10*3/uL — ABNORMAL HIGH (ref 0.00–0.07)
Basophils Absolute: 0 10*3/uL (ref 0.0–0.1)
Basophils Relative: 0 %
Eosinophils Absolute: 0.2 10*3/uL (ref 0.0–0.5)
Eosinophils Relative: 1 %
HCT: 34.3 % — ABNORMAL LOW (ref 39.0–52.0)
Hemoglobin: 11.4 g/dL — ABNORMAL LOW (ref 13.0–17.0)
Immature Granulocytes: 3 %
Lymphocytes Relative: 2 %
Lymphs Abs: 0.5 10*3/uL — ABNORMAL LOW (ref 0.7–4.0)
MCH: 32 pg (ref 26.0–34.0)
MCHC: 33.2 g/dL (ref 30.0–36.0)
MCV: 96.3 fL (ref 80.0–100.0)
Monocytes Absolute: 1.5 10*3/uL — ABNORMAL HIGH (ref 0.1–1.0)
Monocytes Relative: 7 %
Neutro Abs: 19.9 10*3/uL — ABNORMAL HIGH (ref 1.7–7.7)
Neutrophils Relative %: 87 %
Platelet Count: 225 10*3/uL (ref 150–400)
RBC: 3.56 MIL/uL — ABNORMAL LOW (ref 4.22–5.81)
RDW: 15 % (ref 11.5–15.5)
WBC Count: 22.7 10*3/uL — ABNORMAL HIGH (ref 4.0–10.5)
nRBC: 0 % (ref 0.0–0.2)

## 2019-10-21 LAB — CMP (CANCER CENTER ONLY)
ALT: 12 U/L (ref 0–44)
AST: 8 U/L — ABNORMAL LOW (ref 15–41)
Albumin: 3.5 g/dL (ref 3.5–5.0)
Alkaline Phosphatase: 122 U/L (ref 38–126)
Anion gap: 7 (ref 5–15)
BUN: 9 mg/dL (ref 8–23)
CO2: 26 mmol/L (ref 22–32)
Calcium: 9.3 mg/dL (ref 8.9–10.3)
Chloride: 109 mmol/L (ref 98–111)
Creatinine: 0.68 mg/dL (ref 0.61–1.24)
GFR, Est AFR Am: >60 mL/min (ref >60)
GFR, Estimated: >60 mL/min (ref >60)
Glucose, Bld: 77 mg/dL (ref 70–99)
Potassium: 3.5 mmol/L (ref 3.5–5.1)
Sodium: 142 mmol/L (ref 135–145)
Total Bilirubin: 0.3 mg/dL (ref 0.3–1.2)
Total Protein: 6.1 g/dL — ABNORMAL LOW (ref 6.5–8.1)

## 2019-10-21 MED ORDER — SODIUM CHLORIDE 0.9% FLUSH
10.0000 mL | Freq: Once | INTRAVENOUS | Status: AC
  Administered 2019-10-21: 10 mL
  Filled 2019-10-21: qty 10

## 2019-10-27 ENCOUNTER — Telehealth: Payer: Self-pay | Admitting: Medical Oncology

## 2019-10-27 NOTE — Telephone Encounter (Signed)
"  Please send order for nursing staff to continue eyedrops. [name] said his eyes are swollen."  Garamycin prescription expired Saturday.  He has appt with PCP next Friday .   [facility] appt tomorrow to assess eyes. Schedule message sent.

## 2019-10-28 ENCOUNTER — Inpatient Hospital Stay: Payer: Medicaid Other

## 2019-10-28 ENCOUNTER — Other Ambulatory Visit: Payer: Self-pay

## 2019-10-28 ENCOUNTER — Inpatient Hospital Stay (HOSPITAL_BASED_OUTPATIENT_CLINIC_OR_DEPARTMENT_OTHER): Payer: Medicaid Other | Admitting: Medical

## 2019-10-28 ENCOUNTER — Encounter: Payer: Self-pay | Admitting: Medical

## 2019-10-28 ENCOUNTER — Inpatient Hospital Stay: Payer: Medicaid Other | Attending: Internal Medicine

## 2019-10-28 VITALS — BP 98/77 | HR 80 | Temp 97.9°F | Resp 18 | Ht 67.0 in | Wt 120.0 lb

## 2019-10-28 DIAGNOSIS — G629 Polyneuropathy, unspecified: Secondary | ICD-10-CM | POA: Diagnosis not present

## 2019-10-28 DIAGNOSIS — Z95828 Presence of other vascular implants and grafts: Secondary | ICD-10-CM | POA: Diagnosis not present

## 2019-10-28 DIAGNOSIS — H1013 Acute atopic conjunctivitis, bilateral: Secondary | ICD-10-CM

## 2019-10-28 DIAGNOSIS — C8338 Diffuse large B-cell lymphoma, lymph nodes of multiple sites: Secondary | ICD-10-CM | POA: Insufficient documentation

## 2019-10-28 DIAGNOSIS — Z87891 Personal history of nicotine dependence: Secondary | ICD-10-CM | POA: Insufficient documentation

## 2019-10-28 LAB — LACTATE DEHYDROGENASE: LDH: 175 U/L (ref 98–192)

## 2019-10-28 LAB — CBC WITH DIFFERENTIAL (CANCER CENTER ONLY)
Abs Immature Granulocytes: 0.15 10*3/uL — ABNORMAL HIGH (ref 0.00–0.07)
Basophils Absolute: 0.1 10*3/uL (ref 0.0–0.1)
Basophils Relative: 1 %
Eosinophils Absolute: 0.1 10*3/uL (ref 0.0–0.5)
Eosinophils Relative: 1 %
HCT: 39.1 % (ref 39.0–52.0)
Hemoglobin: 12.9 g/dL — ABNORMAL LOW (ref 13.0–17.0)
Immature Granulocytes: 1 %
Lymphocytes Relative: 6 %
Lymphs Abs: 0.7 10*3/uL (ref 0.7–4.0)
MCH: 31.3 pg (ref 26.0–34.0)
MCHC: 33 g/dL (ref 30.0–36.0)
MCV: 94.9 fL (ref 80.0–100.0)
Monocytes Absolute: 0.8 10*3/uL (ref 0.1–1.0)
Monocytes Relative: 7 %
Neutro Abs: 9.7 10*3/uL — ABNORMAL HIGH (ref 1.7–7.7)
Neutrophils Relative %: 84 %
Platelet Count: 190 10*3/uL (ref 150–400)
RBC: 4.12 MIL/uL — ABNORMAL LOW (ref 4.22–5.81)
RDW: 15.4 % (ref 11.5–15.5)
WBC Count: 11.5 10*3/uL — ABNORMAL HIGH (ref 4.0–10.5)
nRBC: 0 % (ref 0.0–0.2)

## 2019-10-28 LAB — CMP (CANCER CENTER ONLY)
ALT: 14 U/L (ref 0–44)
AST: 14 U/L — ABNORMAL LOW (ref 15–41)
Albumin: 3.6 g/dL (ref 3.5–5.0)
Alkaline Phosphatase: 115 U/L (ref 38–126)
Anion gap: 6 (ref 5–15)
BUN: 10 mg/dL (ref 8–23)
CO2: 24 mmol/L (ref 22–32)
Calcium: 9.7 mg/dL (ref 8.9–10.3)
Chloride: 109 mmol/L (ref 98–111)
Creatinine: 0.76 mg/dL (ref 0.61–1.24)
GFR, Est AFR Am: >60 mL/min (ref >60)
GFR, Estimated: >60 mL/min (ref >60)
Glucose, Bld: 82 mg/dL (ref 70–99)
Potassium: 4 mmol/L (ref 3.5–5.1)
Sodium: 139 mmol/L (ref 135–145)
Total Bilirubin: 0.2 mg/dL — ABNORMAL LOW (ref 0.3–1.2)
Total Protein: 6.6 g/dL (ref 6.5–8.1)

## 2019-10-28 LAB — URIC ACID: Uric Acid, Serum: 3.5 mg/dL — ABNORMAL LOW (ref 3.7–8.6)

## 2019-10-28 MED ORDER — SODIUM CHLORIDE 0.9% FLUSH
10.0000 mL | Freq: Once | INTRAVENOUS | Status: AC
  Administered 2019-10-28: 10 mL
  Filled 2019-10-28: qty 10

## 2019-10-28 MED ORDER — CROMOLYN SODIUM 4 % OP SOLN: 2.0000 [drp] | Freq: Four times a day (QID) | OPHTHALMIC | 3 refills | Status: DC

## 2019-10-28 MED ORDER — HEPARIN SOD (PORK) LOCK FLUSH 100 UNIT/ML IV SOLN
500.0000 [IU] | Freq: Once | INTRAVENOUS | Status: AC
  Administered 2019-10-28: 500 [IU]
  Filled 2019-10-28: qty 5

## 2019-10-28 NOTE — Progress Notes (Signed)
Pt left accessed for symptom management

## 2019-10-28 NOTE — Patient Instructions (Signed)

## 2019-10-30 ENCOUNTER — Telehealth: Payer: Self-pay

## 2019-10-30 NOTE — Telephone Encounter (Signed)
Information regarding payee faxed to [employer] - attn: Joycelyn Schmid

## 2019-10-30 NOTE — Progress Notes (Signed)
[facility] Progress Note   [name] [id number] 1956/02/19 64 y.o.  [name] is managed by Dr. Fanny Bien. [name]  Actively treated with chemotherapy/immunotherapy/hormonal therapy: yes  Current therapy: R-CHOP   Last treated: 10/16/2019 (cycle #8)  Next scheduled appointment with provider: 11/17/2019  Assessment: Plan:    Port-A-Cath in place - Plan: heparin lock flush 100 unit/mL, sodium chloride flush (NS) 0.9 % injection 10 mL  Diffuse large B-cell lymphoma of lymph nodes of multiple regions ([place]) - Plan: heparin lock flush 100 unit/mL, sodium chloride flush (NS) 0.9 % injection 10 mL  Allergic conjunctivitis of both eyes   Allergic conjunctivitis of the right eye: The patient was given a prescription for cromolyn eyedrops.  Diffuse large B-cell lymphoma: The patient continues to be managed by Dr. Julien Nordmann and is status post cycle 8 of R-CHOP which was dosed on 10/16/2019. He is scheduled to be seen in follow-up on 11/17/2019 after having a restaging CT/PET scan completed on 11/14/2019.Marland Kitchen  Please see After Visit Summary for patient specific instructions.  Future Appointments  Date Time Provider [place]  11/04/2019 10:00 AM [facility] LAB/FLUSH [facility] None  11/04/2019 10:15 AM [facility] [place] FLUSH [facility] None  11/12/2019 10:45 AM Andrew Au, MD [facility]  11/14/2019  8:00 AM [facility] PET CT 1 [facility] [place]  11/17/2019  3:15 PM [name], MD [facility] None  02/25/2020 10:45 AM Frann Rider, NP [facility] None    No orders of the defined types were placed in this encounter.      Subjective:   Patient ID:  [name] is a 64 y.o. (DOB [birth year]) male.  Chief Complaint:  Chief Complaint  Patient presents with  . Eye Problem    Eye Problem  Associated symptoms include an eye discharge, eye redness and itching. Pertinent negatives include no fever, nausea, photophobia or vomiting.   [name] is  a 64 y.o. male with a diagnosis of large B-cell lymphoma. He is managed by Dr. Earlie Server and is status post cycle 8 of R-CHOP which was dosed on 10/16/2019.  He was seen on 09/30/2019 for a conjunctivitis.  He was prescribed gentamicin ointment.  He reports that his eyes had felt better while he was on this medication.  His eyes are hurting him today.  He reports it feels as though something is in his eye.  His eye is sore, red, itching, and watering.  He denies any change in vision.  Medications: I have reviewed the patient's current medications.  Allergies: No Known Allergies  Past Medical History:  Diagnosis Date  . CAD (coronary artery disease)    a. 04/2008 Cath: LM nl, LAD 50p, 23m, LCX min irregs, RCA 50p/m, 40d, RPDA 60-70ost; b. 04/2008 CABG x 4: LIMA->LAD, VG->D2, VG->RPDA->RPL.  . Cancer ([place])   . Cocaine abuse ([place])    a. Quit early 2019.  . Ischemic cardiomyopathy    a. LV gram: EF 30-35%, apical/periapical AK.  . Stroke ([place]) 02/2019  . Stroke ([place]) 02/2019  . Tobacco abuse     Past Surgical History:  Procedure Laterality Date  . BYPASS GRAFT  2010  . CARDIAC SURGERY  2010  . IR IMAGING GUIDED PORT INSERTION  05/26/2019  . SUPRACLAVICAL NODE BIOPSY Left 05/08/2019   Procedure: SUPRACLAVICAL NODE BIOPSY;  Surgeon: [name], MD;  Location: [facility] OR;  Service: Thoracic;  Laterality: Left;    Family History  Family history unknown: Yes    Social  History   Socioeconomic History  . Marital status: Single    Spouse name: Not on file  . Number of children: Not on file  . Years of education: Not on file  . Highest education level: Not on file  Occupational History  . Not on file  Tobacco Use  . Smoking status: Former Smoker    Packs/day: 0.30    Types: Cigarettes  . Smokeless tobacco: Current User  . Tobacco comment: has smoked for 40+ yrs - at most 1ppd, currently 1ppwk.  Vaping Use  . Vaping Use: Never used  Substance and Sexual Activity  . Alcohol use:  Not Currently    Comment: quite > 30 yrs ago.  . Drug use: Not Currently    Types: Cocaine    Comment: prev used cocaine - quit early 2019.  Marland Kitchen Sexual activity: Not on file  Other Topics Concern  . Not on file  Social History Narrative   Lives in [place] by himself.  Currently staying in a half-way house designed to help those addicted to drugs get clean, and find housing/employment.  He exercises some - walks/push ups.   Social Determinants of Health   Financial Resource Strain:   . Difficulty of Paying Living Expenses:   Food Insecurity:   . Worried About Charity fundraiser in the Last Year:   . Arboriculturist in the Last Year:   Transportation Needs:   . Film/video editor (Medical):   Marland Kitchen Lack of Transportation (Non-Medical):   Physical Activity:   . Days of Exercise per Week:   . Minutes of Exercise per Session:   Stress:   . Feeling of Stress :   Social Connections:   . Frequency of Communication with Friends and Family:   . Frequency of Social Gatherings with Friends and Family:   . Attends Religious Services:   . Active Member of Clubs or Organizations:   . Attends Archivist Meetings:   Marland Kitchen Marital Status:   Intimate Partner Violence:   . Fear of Current or Ex-Partner:   . Emotionally Abused:   Marland Kitchen Physically Abused:   . Sexually Abused:     Past Medical History, Surgical history, Social history, and Family history were reviewed and updated as appropriate.   Please see review of systems for further details on the patient's review from today.   Review of Systems:  Review of Systems  Constitutional: Negative for chills, diaphoresis and fever.  HENT: Negative for trouble swallowing and voice change.   Eyes: Positive for pain, discharge, redness and itching. Negative for photophobia and visual disturbance.  Respiratory: Negative for cough, chest tightness, shortness of breath and wheezing.   Cardiovascular: Negative for chest pain and palpitations.    Gastrointestinal: Negative for abdominal pain, constipation, diarrhea, nausea and vomiting.  Musculoskeletal: Negative for back pain and myalgias.  Neurological: Negative for dizziness, light-headedness and headaches.    Objective:   Physical Exam:  BP 98/77 (BP Location: Left Arm, Patient Position: Sitting) Comment: notified nurse  Pulse 80   Temp 97.9 F (36.6 C) (Temporal)   Resp 18   Ht 5\' 7"  (1.702 m)   Wt 120 lb (54.4 kg)   SpO2 100%   BMI 18.79 kg/m  ECOG: 1  Physical Exam Constitutional:      General: He is not in acute distress.    Appearance: He is not diaphoretic.  HENT:     Head: Normocephalic and atraumatic.  Eyes:  General:        Right eye: No discharge.        Left eye: No discharge.     Conjunctiva/sclera:     Right eye: Right conjunctiva is injected. No exudate or hemorrhage.    Left eye: Left conjunctiva is not injected. No exudate or hemorrhage.    Pupils: Pupils are equal.     Funduscopic exam:    Right eye: No hemorrhage, exudate, AV nicking or papilledema.        Left eye: No hemorrhage, exudate, AV nicking or papilledema.  Skin:    General: Skin is warm and dry.     Findings: No erythema or rash.  Neurological:     Mental Status: He is alert.     Coordination: Coordination normal.     Gait: Gait normal.  Psychiatric:        Mood and Affect: Mood normal.        Behavior: Behavior normal.        Thought Content: Thought content normal.        Judgment: Judgment normal.     Lab Review:     Component Value Date/Time   NA 139 10/28/2019 0940   K 4.0 10/28/2019 0940   CL 109 10/28/2019 0940   CO2 24 10/28/2019 0940   GLUCOSE 82 10/28/2019 0940   BUN 10 10/28/2019 0940   CREATININE 0.76 10/28/2019 0940   CALCIUM 9.7 10/28/2019 0940   PROT 6.6 10/28/2019 0940   ALBUMIN 3.6 10/28/2019 0940   AST 14 (L) 10/28/2019 0940   ALT 14 10/28/2019 0940   ALKPHOS 115 10/28/2019 0940   BILITOT 0.2 (L) 10/28/2019 0940   GFRNONAA >60  10/28/2019 0940   GFRAA >60 10/28/2019 0940       Component Value Date/Time   WBC 11.5 (H) 10/28/2019 0940   WBC 8.9 05/08/2019 0550   RBC 4.12 (L) 10/28/2019 0940   HGB 12.9 (L) 10/28/2019 0940   HGB 13.4 01/01/2008 1334   HCT 39.1 10/28/2019 0940   HCT 38.7 01/01/2008 1334   PLT 190 10/28/2019 0940   PLT 200 01/01/2008 1334   MCV 94.9 10/28/2019 0940   MCV 94.5 01/01/2008 1334   MCH 31.3 10/28/2019 0940   MCHC 33.0 10/28/2019 0940   RDW 15.4 10/28/2019 0940   RDW 13.9 01/01/2008 1334   LYMPHSABS 0.7 10/28/2019 0940   LYMPHSABS 1.1 01/01/2008 1334   MONOABS 0.8 10/28/2019 0940   MONOABS 0.2 01/01/2008 1334   EOSABS 0.1 10/28/2019 0940   EOSABS 0.1 01/01/2008 1334   BASOSABS 0.1 10/28/2019 0940   BASOSABS 0.0 01/01/2008 1334   -------------------------------  Imaging from last 24 hours (if applicable):  Radiology interpretation: No results found.      This case was discussed with Dr. Julien Nordmann. He expressed agreement with my management of this patient.

## 2019-11-03 ENCOUNTER — Telehealth: Payer: Self-pay | Admitting: Medical Oncology

## 2019-11-03 ENCOUNTER — Encounter: Payer: Self-pay | Admitting: Medical Oncology

## 2019-11-03 NOTE — Telephone Encounter (Signed)
Asking if Prednisone can be discontinued . Note faxed to discontinue prednisone.

## 2019-11-04 ENCOUNTER — Inpatient Hospital Stay: Payer: Medicaid Other

## 2019-11-04 ENCOUNTER — Other Ambulatory Visit: Payer: Self-pay

## 2019-11-04 DIAGNOSIS — Z95828 Presence of other vascular implants and grafts: Secondary | ICD-10-CM

## 2019-11-04 DIAGNOSIS — C8338 Diffuse large B-cell lymphoma, lymph nodes of multiple sites: Secondary | ICD-10-CM | POA: Diagnosis not present

## 2019-11-04 LAB — CMP (CANCER CENTER ONLY)
ALT: 9 U/L (ref 0–44)
AST: 11 U/L — ABNORMAL LOW (ref 15–41)
Albumin: 3.5 g/dL (ref 3.5–5.0)
Alkaline Phosphatase: 78 U/L (ref 38–126)
Anion gap: 7 (ref 5–15)
BUN: 12 mg/dL (ref 8–23)
CO2: 25 mmol/L (ref 22–32)
Calcium: 9.6 mg/dL (ref 8.9–10.3)
Chloride: 109 mmol/L (ref 98–111)
Creatinine: 0.75 mg/dL (ref 0.61–1.24)
GFR, Est AFR Am: >60 mL/min (ref >60)
GFR, Estimated: >60 mL/min (ref >60)
Glucose, Bld: 109 mg/dL — ABNORMAL HIGH (ref 70–99)
Potassium: 3.8 mmol/L (ref 3.5–5.1)
Sodium: 141 mmol/L (ref 135–145)
Total Bilirubin: 0.5 mg/dL (ref 0.3–1.2)
Total Protein: 6.6 g/dL (ref 6.5–8.1)

## 2019-11-04 LAB — CBC WITH DIFFERENTIAL (CANCER CENTER ONLY)
Abs Immature Granulocytes: 0.02 10*3/uL (ref 0.00–0.07)
Basophils Absolute: 0.1 10*3/uL (ref 0.0–0.1)
Basophils Relative: 2 %
Eosinophils Absolute: 0.1 10*3/uL (ref 0.0–0.5)
Eosinophils Relative: 2 %
HCT: 38.2 % — ABNORMAL LOW (ref 39.0–52.0)
Hemoglobin: 12.8 g/dL — ABNORMAL LOW (ref 13.0–17.0)
Immature Granulocytes: 0 %
Lymphocytes Relative: 11 %
Lymphs Abs: 0.6 10*3/uL — ABNORMAL LOW (ref 0.7–4.0)
MCH: 32.2 pg (ref 26.0–34.0)
MCHC: 33.5 g/dL (ref 30.0–36.0)
MCV: 96 fL (ref 80.0–100.0)
Monocytes Absolute: 0.5 10*3/uL (ref 0.1–1.0)
Monocytes Relative: 11 %
Neutro Abs: 3.7 10*3/uL (ref 1.7–7.7)
Neutrophils Relative %: 74 %
Platelet Count: 268 10*3/uL (ref 150–400)
RBC: 3.98 MIL/uL — ABNORMAL LOW (ref 4.22–5.81)
RDW: 15.4 % (ref 11.5–15.5)
WBC Count: 5 10*3/uL (ref 4.0–10.5)
nRBC: 0 % (ref 0.0–0.2)

## 2019-11-04 MED ORDER — SODIUM CHLORIDE 0.9% FLUSH
10.0000 mL | Freq: Once | INTRAVENOUS | Status: AC
  Administered 2019-11-04: 10 mL
  Filled 2019-11-04: qty 10

## 2019-11-04 MED ORDER — HEPARIN SOD (PORK) LOCK FLUSH 100 UNIT/ML IV SOLN
500.0000 [IU] | Freq: Once | INTRAVENOUS | Status: AC
  Administered 2019-11-04: 500 [IU]
  Filled 2019-11-04: qty 5

## 2019-11-12 ENCOUNTER — Encounter: Payer: Medicaid Other | Admitting: Student

## 2019-11-14 ENCOUNTER — Ambulatory Visit (HOSPITAL_COMMUNITY)
Admission: RE | Admit: 2019-11-14 | Discharge: 2019-11-14 | Disposition: A | Discharge status: Home / Self Care | Payer: Medicaid Other | Source: Ambulatory Visit | Attending: Internal Medicine | Admitting: Internal Medicine

## 2019-11-14 ENCOUNTER — Other Ambulatory Visit: Payer: Self-pay

## 2019-11-14 DIAGNOSIS — C8338 Diffuse large B-cell lymphoma, lymph nodes of multiple sites: Secondary | ICD-10-CM | POA: Insufficient documentation

## 2019-11-14 LAB — GLUCOSE, CAPILLARY: Glucose-Capillary: 77 mg/dL (ref 70–99)

## 2019-11-14 MED ORDER — FLUDEOXYGLUCOSE F - 18 (FDG) INJECTION
5.9000 | Freq: Once | INTRAVENOUS | Status: AC | PRN
  Administered 2019-11-14: 5.9 via INTRAVENOUS

## 2019-11-17 ENCOUNTER — Inpatient Hospital Stay (HOSPITAL_BASED_OUTPATIENT_CLINIC_OR_DEPARTMENT_OTHER): Payer: Medicaid Other | Admitting: Internal Medicine

## 2019-11-17 ENCOUNTER — Other Ambulatory Visit: Payer: Self-pay

## 2019-11-17 ENCOUNTER — Telehealth: Payer: Self-pay | Admitting: Internal Medicine

## 2019-11-17 ENCOUNTER — Encounter: Payer: Self-pay | Admitting: Internal Medicine

## 2019-11-17 VITALS — BP 94/66 | HR 69 | Temp 97.6°F | Resp 18 | Ht 67.0 in | Wt 120.3 lb

## 2019-11-17 DIAGNOSIS — I10 Essential (primary) hypertension: Secondary | ICD-10-CM | POA: Diagnosis not present

## 2019-11-17 DIAGNOSIS — C8338 Diffuse large B-cell lymphoma, lymph nodes of multiple sites: Secondary | ICD-10-CM

## 2019-11-17 NOTE — Progress Notes (Signed)
[facility] [facility] Telephone:([phone]) [phone]   Fax:(336) [phone]  OFFICE PROGRESS NOTE  Elsie Stain, MD 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    PRIOR THERAPY: Systemic chemotherapy with R-CHOP every 3 weeks.  First dose May 20, 2019.  Status post 8 cycles.   CURRENT THERAPY: Observation.  INTERVAL HISTORY: [name] 64 y.o. male returns to the clinic today for follow-up visit.  The patient is feeling fine today with no concerning complaints.  He denied having any chest pain, shortness of breath, cough or hemoptysis.  He denied having any fever or chills.  He has no nausea, vomiting, diarrhea or constipation.  He denied having any headache or visual changes.  He has no recent weight loss or night sweats.  The patient tolerated his previous systemic chemotherapy with R-CHOP fairly well.  He had repeat PET scan performed recently and he is here for evaluation and discussion of his scan results and further recommendation regarding his condition.  MEDICAL HISTORY: Past Medical History:  Diagnosis Date  . CAD (coronary artery disease)    a. 04/2008 Cath: LM nl, LAD 50p, 6m, LCX min irregs, RCA 50p/m, 40d, RPDA 60-70ost; b. 04/2008 CABG x 4: LIMA->LAD, VG->D2, VG->RPDA->RPL.  . Cancer ([place])   . Cocaine abuse ([place])    a. Quit early 2019.  . Ischemic cardiomyopathy    a. LV gram: EF 30-35%, apical/periapical AK.  . Stroke ([place]) 02/2019  . Stroke ([place]) 02/2019  . Tobacco abuse     ALLERGIES:  has No Known Allergies.  MEDICATIONS:  Current Outpatient Medications    Medication Sig Dispense Refill  . acetaminophen (TYLENOL) 325 MG tablet Take 2 tablets (650 mg total) by mouth every 4 (four) hours as needed for mild pain (or temp > 37.5 C (99.5 F)).    Marland Kitchen allopurinol (ZYLOPRIM) 100 MG tablet TAKE 1 TABLET (100 MG TOTAL) BY MOUTH 2 (TWO) TIMES DAILY. 60 tablet 2  . aspirin EC 81 MG tablet Take 1 tablet (81 mg total) by mouth daily. 30 tablet 3  . cromolyn (OPTICROM) 4 % ophthalmic solution Place 2 drops into the right eye 4 (four) times daily. 10 mL 3  . docusate sodium (COLACE) 100 MG capsule Take 1 capsule (100 mg total) by mouth every 12 (twelve) hours. 60 capsule 0  . gentamicin ointment (GARAMYCIN) 0.1 % Apply topically 3 (three) times daily. Apply 1/2 inch strip to both lower eyelid pouches x 10 days. 30 g 0  . lidocaine-prilocaine (EMLA) cream Apply 1 application topically as needed. Apply a teaspoon amount to port site an hour prior to chemo therapy, DO NOT rub in, cover with plastic wrap. 30 g 0  . oxyCODONE-acetaminophen (PERCOCET/ROXICET) 5-325 MG tablet Take 1 tablet by mouth every 6 (six) hours as needed for severe pain. 30 tablet 0  . polyethylene glycol (MIRALAX / GLYCOLAX) 17 g packet Take 17 g by mouth daily. 14 each 0  . predniSONE (DELTASONE) 50 MG tablet 2 tablet p.o. daily for 5 days start with the first day of every chemotherapy cycle. 80 tablet 0  . prochlorperazine (COMPAZINE) 10  MG tablet Take 1 tablet (10 mg total) by mouth every 6 (six) hours as needed for nausea or vomiting. 30 tablet 2   No current facility-administered medications for this visit.    SURGICAL HISTORY:  Past Surgical History:  Procedure Laterality Date  . BYPASS GRAFT  2010  . CARDIAC SURGERY  2010  . IR IMAGING GUIDED PORT INSERTION  05/26/2019  . SUPRACLAVICAL NODE BIOPSY Left 05/08/2019   Procedure: SUPRACLAVICAL NODE BIOPSY;  Surgeon: [name], MD;  Location: [facility];  Service: Thoracic;  Laterality: Left;    REVIEW OF SYSTEMS:  Constitutional:  positive for fatigue Eyes: negative Ears, nose, mouth, throat, and face: negative Respiratory: negative Cardiovascular: negative Gastrointestinal: negative Genitourinary:negative Integument/breast: negative Hematologic/lymphatic: negative Musculoskeletal:negative Neurological: negative Behavioral/Psych: negative Endocrine: negative Allergic/Immunologic: negative   PHYSICAL EXAMINATION: General appearance: alert, cooperative, fatigued and no distress Head: Normocephalic, without obvious abnormality, atraumatic Neck: no adenopathy, no JVD, supple, symmetrical, trachea midline and thyroid not enlarged, symmetric, no tenderness/mass/nodules Lymph nodes: Cervical, supraclavicular, and axillary nodes normal. Resp: clear to auscultation bilaterally Back: symmetric, no curvature. ROM normal. No CVA tenderness. Cardio: regular rate and rhythm, S1, S2 normal, no murmur, click, rub or gallop GI: soft, non-tender; bowel sounds normal; no masses,  no organomegaly Extremities: extremities normal, atraumatic, no cyanosis or edema Neurologic: Alert and oriented X 3, normal strength and tone. Normal symmetric reflexes. Normal coordination and gait  ECOG PERFORMANCE STATUS: 1 - Symptomatic but completely ambulatory  Blood pressure 94/66, pulse 69, temperature 97.6 F (36.4 C), temperature source Tympanic, resp. rate 18, height 5\' 7"  (1.702 m), weight 120 lb 4.8 oz (54.6 kg), SpO2 99 %.  LABORATORY DATA: Lab Results  Component Value Date   WBC 5.0 11/04/2019   HGB 12.8 (L) 11/04/2019   HCT 38.2 (L) 11/04/2019   MCV 96.0 11/04/2019   PLT 268 11/04/2019      Chemistry      Component Value Date/Time   NA 141 11/04/2019 1001   K 3.8 11/04/2019 1001   CL 109 11/04/2019 1001   CO2 25 11/04/2019 1001   BUN 12 11/04/2019 1001   CREATININE 0.75 11/04/2019 1001      Component Value Date/Time   CALCIUM 9.6 11/04/2019 1001   ALKPHOS 78 11/04/2019 1001   AST 11 (L) 11/04/2019 1001   ALT 9  11/04/2019 1001   BILITOT 0.5 11/04/2019 1001       RADIOGRAPHIC STUDIES: NM PET Image Restag (PS) Skull Base To Thigh  Result Date: 11/14/2019 CLINICAL DATA:  Subsequent treatment strategy for diffuse large B-cell lymphoma. EXAM: NUCLEAR MEDICINE PET SKULL BASE TO THIGH TECHNIQUE: 5.9 mCi F-18 FDG was injected intravenously. Full-ring PET imaging was performed from the skull base to thigh after the radiotracer. CT data was obtained and used for attenuation correction and anatomic localization. Fasting blood glucose: 77 mg/dl COMPARISON:  04/18/2019 PET-CT. 09/16/2019 CT chest, abdomen and pelvis. FINDINGS: Mediastinal blood pool activity: SUV max 2.8 Liver activity: SUV max 3.2 NECK: The PET and CT images are mass aligned in the head and neck region due to differential patient head rotation, limiting assessment. No appreciable new or residual hypermetabolic lymph nodes in the neck. Incidental CT findings: Subcentimeter hypodense left thyroid nodule is non hypermetabolic and stable. Not clinically significant; no follow-up imaging recommended (ref: J Am Coll Radiol. 2015 Feb;12(2): 143-50).Right internal jugular Port-A-Cath terminates at the cavoatrial junction. Surgical clips in the left supraclavicular neck. CHEST: No new or residual hypermetabolic axillary, mediastinal or hilar lymph  nodes. No residual mass or hypermetabolism in the right perihilar region. Patchy low level FDG uptake associated with mild patchy consolidation in the anterior basilar right upper lobe (series 8/image 48). Incidental CT findings: Mild centrilobular emphysema. A few scattered small solid pulmonary nodules bilaterally up to 5 mm in anterior left upper lobe (series 8/image 39), all stable since 04/18/2019 PET-CT and below PET resolution. No new significant pulmonary nodules. Coronary atherosclerosis status post CABG. Atherosclerotic nonaneurysmal thoracic aorta. ABDOMEN/PELVIS: No abnormal hypermetabolic activity within the  liver, pancreas, adrenal glands, or spleen. Spleen is now normal in size. Craniocaudal splenic length 6.5 cm, previously 13.7 cm on 04/18/2019 PET-CT. No new or residual hypermetabolic or enlarged lymph nodes in the abdomen or pelvis. Incidental CT findings: Small to moderate posterior left bladder diverticulum is stable. SKELETON: No focal hypermetabolic activity to suggest skeletal metastasis. Incidental CT findings: Intact sternotomy wires. IMPRESSION: 1. Complete metabolic response. No new or residual hypermetabolic lymphadenopathy. Right perihilar lung mass has resolved. Spleen is now normal in size and non hypermetabolic. 2. Low level FDG uptake associated with mild patchy consolidation in the anterior basilar right upper lobe, favor inflammatory uptake from post treatment change. Attention on future follow-up chest CT advised. 3. Chronic findings include: Aortic Atherosclerosis (ICD10-I70.0) and Emphysema (ICD10-J43.9). Electronically Signed   By: Ilona Sorrel M.D.   On: 11/14/2019 12:07    ASSESSMENT AND PLAN: This is a 64 years old African-American male recently diagnosed with 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  The patient completed course of systemic chemotherapy with R-CHOP status post 8 cycles.  He tolerated his treatment fairly well with no significant adverse effects. The patient had repeat PET scan performed recently.  I personally and independently reviewed the PET scan images and discussed the result and showed the images to the patient today. The patient had complete response to this treatment with no concerning findings. I recommended for the patient to continue on observation with repeat blood work in 3 months. He was advised to call immediately if he has any  concerning symptoms in the interval. The patient voices understanding of current disease status and treatment options and is in agreement with the current care plan.  All questions were answered. The patient knows to call the clinic with any problems, questions or concerns. We can certainly see the patient much sooner if necessary.   Disclaimer: This note was dictated with voice recognition software. Similar sounding words can inadvertently be transcribed and may not be corrected upon review.

## 2019-11-17 NOTE — Telephone Encounter (Signed)
Scheduled per los. Gave avs and calendar  

## 2019-11-25 ENCOUNTER — Non-Acute Institutional Stay: Payer: Medicaid Other | Admitting: Hospice

## 2019-11-25 ENCOUNTER — Other Ambulatory Visit: Payer: Self-pay

## 2019-11-25 DIAGNOSIS — Z515 Encounter for palliative care: Secondary | ICD-10-CM

## 2019-11-25 DIAGNOSIS — C8250 Diffuse follicle center lymphoma, unspecified site: Secondary | ICD-10-CM

## 2019-11-25 NOTE — Progress Notes (Signed)
[facility] Consult Note Telephone: [phone]  Fax: [phone]  PATIENT NAME: [name] DOB: November 01, 1955 MRN: [id number]  PRIMARY CARE PROVIDER:   Elsie Stain, MD  REFERRING PROVIDER: Dr. Charlott Rakes RESPONSIBLE PARTY:  Self/[name] Weidinger - Niece [phone].     RECOMMENDATIONS/PLAN:   Advance Care Planning:  Visit consisted of building trust and discussions on Palliative Medicine as specialized medical care for people living with serious illness, aimed at facilitating better quality of life through symptoms relief, assisting with advance care plan and establishing goals of care.   Patient said he plans to be discharged to an apartment and will like palliative service to continue to follow him when that happens.  Code Status: Patient is a DO NOT RESUSCITATE.   Goals of Care: Goals of care-to maximize quality of life and symptom management; NP called and updated [name] on visit and patient's status. She expressed appreciation for the call/update. Palliative care will continue to support patient/family and the medical team. 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  Chart view from his last visit with Oncologist 11/17/2019 indicates the patient tolerated his previous systemic chemotherapy with R-CHOP fairly well; PET scan reviewed with patient showed the patient had complete response to this treatment with no concerning findings.He denies nausea or vomiting, in no acute distress.  Appetite is fair; his niece brings choice food occasionally. He denies pain during visit, no coughing, no respiratory distress.  Nursing staff with no concerns at this time.  Encouraged ongoing nursing care. Follow up: Palliative care will continue to follow patient for  goals of care clarification and symptom management. I spent 50 minutes providing this consultation; time iincludes time spent with patient/family, chart review, provider coordination,  and documentation. More than 50% of the time in this consultation was spent on coordinating communication  HISTORY OF PRESENT ILLNESS:  [name] is a 64 y.o. year old male with multiple medical problems including Diffuse large B-cell lymphoma of lymph nodes of multiple regions , CAD, CHF stroke. Palliative Care was asked to help address goals of care.   CODE STATUS: DNR  PPS: 70% HOSPICE ELIGIBILITY/DIAGNOSIS: TBD  PAST MEDICAL HISTORY:  Past Medical History:  Diagnosis Date  . CAD (coronary artery disease)    a. 04/2008 Cath: LM nl, LAD 50p, 25m, LCX min irregs, RCA 50p/m, 40d, RPDA 60-70ost; b. 04/2008 CABG x 4: LIMA->LAD, VG->D2, VG->RPDA->RPL.  . Cancer ([place])   . Cocaine abuse ([place])    a. Quit early 2019.  . Ischemic cardiomyopathy    a. LV gram: EF 30-35%, apical/periapical AK.  . Stroke ([place]) 02/2019  . Stroke ([place]) 02/2019  . Tobacco abuse     SOCIAL HX:  Social History   Tobacco Use  . Smoking status: Former Smoker    Packs/day: 0.30    Types: Cigarettes  . Smokeless tobacco: Current User  . Tobacco comment: has smoked for 40+ yrs - at most 1ppd, currently 1ppwk.  Substance Use Topics  . Alcohol use: Not Currently    Comment: quite > 30 yrs ago.    ALLERGIES: No Known Allergies   PERTINENT MEDICATIONS:  Outpatient Encounter Medications as of 11/25/2019  Medication Sig  . acetaminophen (TYLENOL) 325 MG tablet Take 2 tablets (650 mg total) by mouth every 4 (four) hours as  needed for mild pain (or temp > 37.5 C (99.5 F)).  Marland Kitchen allopurinol (ZYLOPRIM) 100 MG tablet TAKE 1 TABLET (100 MG TOTAL) BY MOUTH 2 (TWO) TIMES DAILY.  Marland Kitchen aspirin EC 81 MG tablet Take 1 tablet (81 mg total) by mouth daily.  . cromolyn (OPTICROM) 4 % ophthalmic solution Place 2 drops into the right eye 4 (four)  times daily.  Marland Kitchen docusate sodium (COLACE) 100 MG capsule Take 1 capsule (100 mg total) by mouth every 12 (twelve) hours.  Marland Kitchen gentamicin ointment (GARAMYCIN) 0.1 % Apply topically 3 (three) times daily. Apply 1/2 inch strip to both lower eyelid pouches x 10 days.  Marland Kitchen lidocaine-prilocaine (EMLA) cream Apply 1 application topically as needed. Apply a teaspoon amount to port site an hour prior to chemo therapy, DO NOT rub in, cover with plastic wrap.  . oxyCODONE-acetaminophen (PERCOCET/ROXICET) 5-325 MG tablet Take 1 tablet by mouth every 6 (six) hours as needed for severe pain.  . polyethylene glycol (MIRALAX / GLYCOLAX) 17 g packet Take 17 g by mouth daily.  . predniSONE (DELTASONE) 50 MG tablet 2 tablet p.o. daily for 5 days start with the first day of every chemotherapy cycle.  . prochlorperazine (COMPAZINE) 10 MG tablet Take 1 tablet (10 mg total) by mouth every 6 (six) hours as needed for nausea or vomiting.   No facility-administered encounter medications on file as of 11/25/2019.    PHYSICAL EXAM/ROS:  General: NAD, frail appearing, thin, cooperative Cardiovascular: regular rate and rhythm; denies chest pain; Port a cath to right upper chest Pulmonary: clear ant/post fields; no adventitious lung sounds on auscultation Abdomen: soft, nontender, + bowel sounds GU: no suprapubic tenderness Extremities: no edema, no joint deformities Skin: no rashes to exposed skin Neurological: Weakness but otherwise nonfocal; alert and oriented x 3 Teodoro Spray, NP

## 2020-01-14 ENCOUNTER — Encounter: Payer: Self-pay | Admitting: *Deleted

## 2020-01-14 NOTE — Progress Notes (Signed)
Oncology Nurse Navigator Documentation  Oncology Nurse Navigator Flowsheets 01/14/2020  Abnormal Finding Date 03/24/2019  Confirmed Diagnosis Date 03/26/2019  Diagnosis Status Confirmed Diagnosis Complete  Planned Course of Treatment Chemotherapy  Phase of Treatment Chemo  Chemotherapy Actual Start Date: 05/20/2019  Chemotherapy Actual End Date: 01/02/2020  Navigator Follow Up Date: 01/14/2020  Navigator Follow Up Reason: Appointment Review  Navigation Complete Date: 01/14/2020  Post Navigation: Continue to Follow Patient? No  Reason Not Navigating Patient: Hospice/Death  Navigator Location [facility]  Navigator Encounter Type Other:  Telephone -  Multidisiplinary Clinic Date -  Multidisiplinary Clinic Type -  Patient Visit Type -  Treatment Phase Other  Barriers/Navigation Needs No Needs  Education -  Interventions Other  Acuity Level 1-No Barriers  Coordination of Care -  Education Method -  Time Spent with Patient 15

## 2020-02-17 ENCOUNTER — Other Ambulatory Visit: Payer: Self-pay

## 2020-02-17 ENCOUNTER — Inpatient Hospital Stay: Payer: Medicaid Other | Attending: Internal Medicine | Admitting: Internal Medicine

## 2020-02-17 ENCOUNTER — Inpatient Hospital Stay: Payer: Medicaid Other

## 2020-02-17 ENCOUNTER — Encounter: Payer: Self-pay | Admitting: Internal Medicine

## 2020-02-17 VITALS — BP 103/71 | HR 62 | Temp 97.2°F | Resp 17 | Ht 67.0 in | Wt 121.9 lb

## 2020-02-17 DIAGNOSIS — D72819 Decreased white blood cell count, unspecified: Secondary | ICD-10-CM | POA: Insufficient documentation

## 2020-02-17 DIAGNOSIS — Z95828 Presence of other vascular implants and grafts: Secondary | ICD-10-CM

## 2020-02-17 DIAGNOSIS — Z8673 Personal history of transient ischemic attack (TIA), and cerebral infarction without residual deficits: Secondary | ICD-10-CM | POA: Diagnosis not present

## 2020-02-17 DIAGNOSIS — Z87891 Personal history of nicotine dependence: Secondary | ICD-10-CM | POA: Diagnosis not present

## 2020-02-17 DIAGNOSIS — R161 Splenomegaly, not elsewhere classified: Secondary | ICD-10-CM

## 2020-02-17 DIAGNOSIS — I10 Essential (primary) hypertension: Secondary | ICD-10-CM

## 2020-02-17 DIAGNOSIS — C8338 Diffuse large B-cell lymphoma, lymph nodes of multiple sites: Secondary | ICD-10-CM | POA: Insufficient documentation

## 2020-02-17 DIAGNOSIS — Z9221 Personal history of antineoplastic chemotherapy: Secondary | ICD-10-CM | POA: Diagnosis not present

## 2020-02-17 LAB — CMP (CANCER CENTER ONLY)
ALT: 16 U/L (ref 0–44)
AST: 17 U/L (ref 15–41)
Albumin: 3.7 g/dL (ref 3.5–5.0)
Alkaline Phosphatase: 87 U/L (ref 38–126)
Anion gap: 11 (ref 5–15)
BUN: 12 mg/dL (ref 8–23)
CO2: 23 mmol/L (ref 22–32)
Calcium: 9.2 mg/dL (ref 8.9–10.3)
Chloride: 108 mmol/L (ref 98–111)
Creatinine: 1.07 mg/dL (ref 0.61–1.24)
GFR, Estimated: >60 mL/min (ref >60)
Glucose, Bld: 95 mg/dL (ref 70–99)
Potassium: 3.8 mmol/L (ref 3.5–5.1)
Sodium: 142 mmol/L (ref 135–145)
Total Bilirubin: 0.5 mg/dL (ref 0.3–1.2)
Total Protein: 6.9 g/dL (ref 6.5–8.1)

## 2020-02-17 LAB — CBC WITH DIFFERENTIAL (CANCER CENTER ONLY)
Abs Immature Granulocytes: 0 10*3/uL (ref 0.00–0.07)
Basophils Absolute: 0.1 10*3/uL (ref 0.0–0.1)
Basophils Relative: 2 %
Eosinophils Absolute: 0.1 10*3/uL (ref 0.0–0.5)
Eosinophils Relative: 5 %
HCT: 42.9 % (ref 39.0–52.0)
Hemoglobin: 14.7 g/dL (ref 13.0–17.0)
Immature Granulocytes: 0 %
Lymphocytes Relative: 32 %
Lymphs Abs: 0.8 10*3/uL (ref 0.7–4.0)
MCH: 32 pg (ref 26.0–34.0)
MCHC: 34.3 g/dL (ref 30.0–36.0)
MCV: 93.3 fL (ref 80.0–100.0)
Monocytes Absolute: 0.3 10*3/uL (ref 0.1–1.0)
Monocytes Relative: 13 %
Neutro Abs: 1.1 10*3/uL — ABNORMAL LOW (ref 1.7–7.7)
Neutrophils Relative %: 48 %
Platelet Count: 211 10*3/uL (ref 150–400)
RBC: 4.6 MIL/uL (ref 4.22–5.81)
RDW: 13.8 % (ref 11.5–15.5)
WBC Count: 2.4 10*3/uL — ABNORMAL LOW (ref 4.0–10.5)
nRBC: 0 % (ref 0.0–0.2)

## 2020-02-17 LAB — LACTATE DEHYDROGENASE: LDH: 140 U/L (ref 98–192)

## 2020-02-17 MED ORDER — HEPARIN SOD (PORK) LOCK FLUSH 100 UNIT/ML IV SOLN
500.0000 [IU] | Freq: Once | INTRAVENOUS | Status: AC
  Administered 2020-02-17: 500 [IU]
  Filled 2020-02-17: qty 5

## 2020-02-17 MED ORDER — LIDOCAINE-PRILOCAINE 2.5-2.5 % EX CREA
TOPICAL_CREAM | CUTANEOUS | Status: AC
  Filled 2020-02-17: qty 5

## 2020-02-17 MED ORDER — SODIUM CHLORIDE 0.9% FLUSH
10.0000 mL | Freq: Once | INTRAVENOUS | Status: AC
  Administered 2020-02-17: 10 mL
  Filled 2020-02-17: qty 10

## 2020-02-17 NOTE — Progress Notes (Signed)
[facility] Telephone:([phone]) [phone]   Fax:([phone]) [phone]  OFFICE PROGRESS NOTE  Elsie Stain, MD 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    PRIOR THERAPY: Systemic chemotherapy with R-CHOP every 3 weeks.  First dose May 20, 2019.  Status post 8 cycles.   CURRENT THERAPY: Observation.  INTERVAL HISTORY: [name] 64 y.o. male returns to the clinic today for follow-up visit.  The patient is feeling fine today with no concerning complaints.  He denied having any chest pain, shortness of breath, cough or hemoptysis.  He denied having any nausea, vomiting, diarrhea or constipation.  He has no headache or visual changes.  He is currently on observation.  The patient lives with his cousin.  He is here today for evaluation and repeat blood work.  MEDICAL HISTORY: Past Medical History:  Diagnosis Date  . CAD (coronary artery disease)    a. 04/2008 Cath: LM nl, LAD 50p, 46m, LCX min irregs, RCA 50p/m, 40d, RPDA 60-70ost; b. 04/2008 CABG x 4: LIMA->LAD, VG->D2, VG->RPDA->RPL.  . Cancer ([place])   . Cocaine abuse ([place])    a. Quit early 2019.  . Ischemic cardiomyopathy    a. LV gram: EF 30-35%, apical/periapical AK.  . Stroke ([place]) 02/2019  . Stroke ([place]) 02/2019  . Tobacco abuse     ALLERGIES:  has No Known Allergies.  MEDICATIONS:  Current Outpatient Medications  Medication Sig Dispense Refill  . acetaminophen (TYLENOL) 325 MG tablet Take 2 tablets (650 mg total) by mouth every 4 (four) hours as needed for mild pain (or temp > 37.5 C (99.5 F)).    Marland Kitchen allopurinol  (ZYLOPRIM) 100 MG tablet TAKE 1 TABLET (100 MG TOTAL) BY MOUTH 2 (TWO) TIMES DAILY. 60 tablet 2  . aspirin EC 81 MG tablet Take 1 tablet (81 mg total) by mouth daily. 30 tablet 3  . cromolyn (OPTICROM) 4 % ophthalmic solution Place 2 drops into the right eye 4 (four) times daily. 10 mL 3  . docusate sodium (COLACE) 100 MG capsule Take 1 capsule (100 mg total) by mouth every 12 (twelve) hours. 60 capsule 0  . gentamicin ointment (GARAMYCIN) 0.1 % Apply topically 3 (three) times daily. Apply 1/2 inch strip to both lower eyelid pouches x 10 days. 30 g 0  . lidocaine-prilocaine (EMLA) cream Apply 1 application topically as needed. Apply a teaspoon amount to port site an hour prior to chemo therapy, DO NOT rub in, cover with plastic wrap. 30 g 0  . oxyCODONE-acetaminophen (PERCOCET/ROXICET) 5-325 MG tablet Take 1 tablet by mouth every 6 (six) hours as needed for severe pain. 30 tablet 0  . polyethylene glycol (MIRALAX / GLYCOLAX) 17 g packet Take 17 g by mouth daily. 14 each 0  . predniSONE (DELTASONE) 50 MG tablet 2 tablet p.o. daily for 5 days start with the first day of every chemotherapy cycle. 80 tablet 0  . prochlorperazine (COMPAZINE) 10 MG tablet Take 1 tablet (10 mg total) by mouth every 6 (six) hours as needed for nausea or vomiting. 30 tablet 2   No current facility-administered medications for this visit.  SURGICAL HISTORY:  Past Surgical History:  Procedure Laterality Date  . BYPASS GRAFT  2010  . CARDIAC SURGERY  2010  . IR IMAGING GUIDED PORT INSERTION  05/26/2019  . SUPRACLAVICAL NODE BIOPSY Left 05/08/2019   Procedure: SUPRACLAVICAL NODE BIOPSY;  Surgeon: [name], MD;  Location: [facility];  Service: Thoracic;  Laterality: Left;    REVIEW OF SYSTEMS:  A comprehensive review of systems was negative except for: Constitutional: positive for fatigue   PHYSICAL EXAMINATION: General appearance: alert, cooperative and no distress Head: Normocephalic, without obvious  abnormality, atraumatic Neck: no adenopathy, no JVD, supple, symmetrical, trachea midline and thyroid not enlarged, symmetric, no tenderness/mass/nodules Lymph nodes: Cervical, supraclavicular, and axillary nodes normal. Resp: clear to auscultation bilaterally Back: symmetric, no curvature. ROM normal. No CVA tenderness. Cardio: regular rate and rhythm, S1, S2 normal, no murmur, click, rub or gallop GI: soft, non-tender; bowel sounds normal; no masses,  no organomegaly Extremities: extremities normal, atraumatic, no cyanosis or edema  ECOG PERFORMANCE STATUS: 1 - Symptomatic but completely ambulatory  Blood pressure 103/71, pulse 62, temperature (!) 97.2 F (36.2 C), temperature source Tympanic, resp. rate 17, height 5\' 7"  (1.702 m), weight 121 lb 14.4 oz (55.3 kg), SpO2 98 %.  LABORATORY DATA: Lab Results  Component Value Date   WBC 2.4 (L) 02/17/2020   HGB 14.7 02/17/2020   HCT 42.9 02/17/2020   MCV 93.3 02/17/2020   PLT 211 02/17/2020      Chemistry      Component Value Date/Time   NA 141 11/04/2019 1001   K 3.8 11/04/2019 1001   CL 109 11/04/2019 1001   CO2 25 11/04/2019 1001   BUN 12 11/04/2019 1001   CREATININE 0.75 11/04/2019 1001      Component Value Date/Time   CALCIUM 9.6 11/04/2019 1001   ALKPHOS 78 11/04/2019 1001   AST 11 (L) 11/04/2019 1001   ALT 9 11/04/2019 1001   BILITOT 0.5 11/04/2019 1001       RADIOGRAPHIC STUDIES: No results found.  ASSESSMENT AND PLAN: This is a 64 years old African-American male recently diagnosed with 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  The patient completed course of systemic chemotherapy with R-CHOP status post 8 cycles.  He tolerated his treatment fairly well with no significant adverse  effects. The patient is current on observation and he is feeling fine today with no concerning complaints. His blood work today is unremarkable except for leukocytopenia. I recommended for the patient to continue on observation but I will repeat CBC and LDH in 2 weeks for further evaluation of his leukocytopenia.  I also advised the patient to stop taking allopurinol and prednisone and also to avoid any NSAIDs. He will come back for follow-up visit in 3 months with repeat CT scan of the chest, abdomen pelvis for restaging of his disease. The patient was advised to call immediately if he has any concerning symptoms in the interval. The patient voices understanding of current disease status and treatment options and is in agreement with the current care plan.  All questions were answered. The patient knows to call the clinic with any problems, questions or concerns. We can certainly see the patient much sooner if necessary.   Disclaimer: This note was dictated with voice recognition software. Similar sounding words can inadvertently  be transcribed and may not be corrected upon review.

## 2020-02-25 ENCOUNTER — Encounter: Payer: Self-pay | Admitting: Adult Health

## 2020-02-25 ENCOUNTER — Other Ambulatory Visit: Payer: Self-pay

## 2020-02-25 ENCOUNTER — Ambulatory Visit: Payer: Medicaid Other | Admitting: Adult Health

## 2020-02-25 VITALS — BP 99/66 | HR 71 | Ht 67.0 in | Wt 119.0 lb

## 2020-02-25 DIAGNOSIS — I10 Essential (primary) hypertension: Secondary | ICD-10-CM

## 2020-02-25 DIAGNOSIS — E782 Mixed hyperlipidemia: Secondary | ICD-10-CM

## 2020-02-25 DIAGNOSIS — I63511 Cerebral infarction due to unspecified occlusion or stenosis of right middle cerebral artery: Secondary | ICD-10-CM | POA: Diagnosis not present

## 2020-02-25 NOTE — Patient Instructions (Signed)
Continue aspirin 81 mg daily  for secondary stroke prevention  Continue to follow up with PCP regarding cholesterol and blood pressure management  Maintain strict control of hypertension with blood pressure goal below 130/90 and cholesterol with LDL cholesterol (bad cholesterol) goal below 70 mg/dL.    Overall stable from a stroke standpoint and recommend follow up on an as needed basis Please do not hesitate to call with any questions or concerns regarding your prior stroke     Thank you for coming to see [place] at [facility]. I hope we have been able to provide you high quality care today.  You may receive a patient satisfaction survey over the next few weeks. We would appreciate your feedback and comments so that we may continue to improve ourselves and the health of our patients.

## 2020-02-25 NOTE — Progress Notes (Signed)
[facility] [address] [place]. [place] [address] [phone]       STROKE FOLLOW UP NOTE  [name] Date of Birth:  04-14-1955 Medical Record Number:  [id number]   Reason for Referral: stroke follow up    CHIEF COMPLAINT:  Chief Complaint  Patient presents with  . Follow-up    rm 9  . Cerebrovascular Accident    pt is having no new sx. Pt said he was dignosed with Cancer te same time of the stroke.    HPI:  Today, 02/25/2020, [name] returns for 83-month stroke follow-up.  He has been doing well since prior visit without new or worsening stroke/TIA symptoms with residual LUE weakness.  He is currently ambulating without assistive device and has been getting stronger since completing chemo in 09/2019.  He has since been discharged from ALF around September and is currently residing with his cousin.  Remains on aspirin 81 mg daily without bleeding or bruising.  Blood pressure today 99/66.  No concerns at this time.    History provided for reference purposes only Update 08/26/2019 [name]: [name] returns for follow-up regarding right MCA stroke in 02/2019 secondary to cardiomyopathy and cocaine use.  He is accompanied by his niece.  Residual stroke deficits of left hemiparesis and endorses ongoing improvement.  Ambulating with RW due to legs fatiguing occassionally. After his stroke, his sister became more involved in his care and was present during prior visit but she has unfortunately passed away.  His niece and nephew continue assisting with care needs and as he was previously homeless, he has been admitted to [facility]. Per niece, housing authority possibly will be finding him an apartment to live independently in the near future. 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   He continues to follow with oncology closely.  He continues  on aspirin 81 mg daily for secondary stroke prevention without side effects.  Blood pressure today 100/64. He has not restarted statin - has appt with cardiology next week and he plans on discussing further at that time. No concerns at this time.   Initial visit 04/22/2019 [name]: [name] is a 64 year old male who is being seen today for hospital stroke follow-up accompanied by his sister.  Residual stroke deficits of left hemiparesis but does endorse improvement.  He has not started outpatient therapy due to multiple appointments and work-up with oncology.  He is currently in a wheelchair for which he uses for long distance but is able to ambulate without difficulty short distance.  He was recently referred to social work by oncology office for psychosocial needs.  His sister recently had contact with patient after learning of his diagnosis and plans on supporting him ongoing. [place].  His greatest concern at today's visit is regarding excessive amount of pain which limits his daily functioning and activity.  Aspirin and Plavix were placed on hold during recent admission at the end of December due to undergoing biopsies and unable to determine why these have not been restarted.  He also has discontinued atorvastatin for undetermined reasons.  Blood pressure today 100/65.  No further concerns at this time.  Stroke admission 03/04/2019: [name] is a 63 y.o. male with history of CAD s/p CABG, cocaine abuse, ischemic cardiomyopathy and tobacco abuse who presented on 03/04/2019  with L sided weakness.  Evaluated by Dr. Leonie Man and stroke team with stroke work-up revealing right MCA infarct embolic as evidenced on MRI in setting of cocaine use and cardiomyopathy.  CTA head/neck showed right M1 occlusion and severe bilateral ICA cavernous stenosis.  CT perfusion no core infarct.  2D echo showed diminished EF 40 to 45% with global hypokinesis.  UDS positive for cocaine.   Previously on aspirin 81 mg daily and recommended DAPT for 3 weeks then aspirin alone given mild stroke.  HTN stable.  LDL 136 and recommended resuming atorvastatin 40 mg daily as patient previously noncompliant.  Prediabetes with A1c 6.3.  Other stroke risk factors include tobacco use with smoking cessation counseling provided, EtOH use with alcohol level <10, substance abuse with UDS cocaine positive, CAD s/p CABG and stents and ischemic cardiomyopathy with known low EF 30 to 35%.  Residual stroke deficits with mild left hemiparesis and was discharged to CIR for ongoing therapy on 03/06/19.  During CIR, patient bouts of constipation KUB negative for ileus there was a large stool burden patient did receive fleets enema.  Patient with incidental findings of inguinal mass with ultrasound showed oval hypoechoic mass just below the skin over the left inguinal region corresponding to patient's palpable abnormality.  Felt this could possibly represent abnormal lymph node versus complex fluid collection such as hematoma or subcutaneous abscess remained afebrile with ongoing monitoring.  He was eventually discharged home on 03/15/2019. 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  Biopsy of left inguinal metastatic adenopathy with final pathology showing necrotic tissue with no evidence for malignancy.  He did have follow-up with oncology on 04/03/2019 and recommended undergoing PET scan for further evaluation and additional biopsies.  Reevaluated yesterday by oncology after PET scan with high suspicion malignancy  questionable primary bronchogenic carcinoma versus lymphoma with extensive hypermetabolic adenopathy in the abdomen and pelvis with bulky right perihilar mass and left supraclavicular adenopathy as well as large unchanged hypodense masses within the spleen.  He was referred to Dr. Roxan Hockey (thoracic surgery) for reevaluation and consideration of biopsy.      ROS:   14 system review of systems performed and negative with exception of weakness  PMH:  Past Medical History:  Diagnosis Date  . CAD (coronary artery disease)    a. 04/2008 Cath: LM nl, LAD 50p, 63m, LCX min irregs, RCA 50p/m, 40d, RPDA 60-70ost; b. 04/2008 CABG x 4: LIMA->LAD, VG->D2, VG->RPDA->RPL.  . Cancer ([place])   . Cocaine abuse ([place])    a. Quit early 2019.  . Ischemic cardiomyopathy    a. LV gram: EF 30-35%, apical/periapical AK.  . Stroke ([place]) 02/2019  . Stroke ([place]) 02/2019  . Tobacco abuse     PSH:  Past Surgical History:  Procedure Laterality Date  . BYPASS GRAFT  2010  . CARDIAC SURGERY  2010  . IR IMAGING GUIDED PORT INSERTION  05/26/2019  . SUPRACLAVICAL NODE BIOPSY Left 05/08/2019   Procedure: SUPRACLAVICAL NODE BIOPSY;  Surgeon: [name], MD;  Location: [facility] OR;  Service: Thoracic;  Laterality: Left;    Social History:  Social History   Socioeconomic History  .  Marital status: Single    Spouse name: Not on file  . Number of children: Not on file  . Years of education: Not on file  . Highest education level: Not on file  Occupational History  . Not on file  Tobacco Use  . Smoking status: Former Smoker    Packs/day: 0.30    Types: Cigarettes  . Smokeless tobacco: Current User  . Tobacco comment: has smoked for 40+ yrs - at most 1ppd, currently 1ppwk.  Vaping Use  . Vaping Use: Never used  Substance and Sexual Activity  . Alcohol use: Not Currently    Comment: quite > 30 yrs ago.  . Drug use: Not Currently    Types: Cocaine    Comment: prev used cocaine - quit early 2019.  Marland Kitchen  Sexual activity: Not on file  Other Topics Concern  . Not on file  Social History Narrative   Lives in [place] by himself.  Currently staying in a half-way house designed to help those addicted to drugs get clean, and find housing/employment.  He exercises some - walks/push ups.   Social Determinants of Health   Financial Resource Strain:   . Difficulty of Paying Living Expenses: Not on file  Food Insecurity:   . Worried About Charity fundraiser in the Last Year: Not on file  . Ran Out of Food in the Last Year: Not on file  Transportation Needs:   . Lack of Transportation (Medical): Not on file  . Lack of Transportation (Non-Medical): Not on file  Physical Activity:   . Days of Exercise per Week: Not on file  . Minutes of Exercise per Session: Not on file  Stress:   . Feeling of Stress : Not on file  Social Connections:   . Frequency of Communication with Friends and Family: Not on file  . Frequency of Social Gatherings with Friends and Family: Not on file  . Attends Religious Services: Not on file  . Active Member of Clubs or Organizations: Not on file  . Attends Archivist Meetings: Not on file  . Marital Status: Not on file  Intimate Partner Violence:   . Fear of Current or Ex-Partner: Not on file  . Emotionally Abused: Not on file  . Physically Abused: Not on file  . Sexually Abused: Not on file    Family History:  Family History  Family history unknown: Yes    Medications:   Current Outpatient Medications on File Prior to Visit  Medication Sig Dispense Refill  . aspirin EC 81 MG tablet Take 1 tablet (81 mg total) by mouth daily. 30 tablet 3  . cromolyn (OPTICROM) 4 % ophthalmic solution Place 2 drops into the right eye 4 (four) times daily. 10 mL 3  . lidocaine-prilocaine (EMLA) cream Apply 1 application topically as needed.     No current facility-administered medications on file prior to visit.    Allergies:  No Known Allergies   Physical  Exam  Vitals:   02/25/20 1039  BP: 99/66  Pulse: 71  Weight: 119 lb (54 kg)  Height: 5\' 7"  (1.702 m)   Body mass index is 18.64 kg/m. No exam data present  General: Frail pleasant elderly African-American male, seated, in no evident distress Head: head normocephalic and atraumatic.   Neck: supple with no carotid or supraclavicular bruits Cardiovascular: regular rate and rhythm, no murmurs Musculoskeletal: no deformity Skin:  no rash/petichiae Vascular:  Normal pulses all extremities   Neurologic Exam Mental Status:  Awake and fully alert.   Fluent speech and language. Oriented to place and time. Recent and remote memory intact. Attention span, concentration and fund of knowledge appropriate. Mood and affect appropriate.  Cranial Nerves: Pupils equal, briskly reactive to light. Extraocular movements full without nystagmus. Visual fields full to confrontation. Hearing intact. Facial sensation intact. Face, tongue, palate moves normally and symmetrically.  Motor: Normal bulk and tone.  LUE 4/5 with decreased finger dexterity; all other extremities 5/5 strength Sensory.: intact to touch , pinprick , position and vibratory sensation.  Coordination: Rapid alternating movements normal in all extremities except decreased left hand. Finger-to-nose and heel-to-shin performed accurately bilaterally.  Orbits right arm over left arm. Gait and Station: Stands from seated position without difficulty.  Stance is normal.  Gait demonstrates normal stride length and balance without use of assistive device. Reflexes: 1+ and symmetric. Toes downgoing.        ASSESSMENT: FENIX RORKE is a 64 y.o. year old male presented with left-sided weakness on 03/04/2019 with stroke work-up revealing right MCA infarct embolic in setting of cocaine use and cardiomyopathy. Vascular risk factors include UDS positive cocaine, EtOH use with alcohol level >10, HTN, HLD, prediabetes, ischemic cardiomyopathy and CAD s/p  CABG and stents.  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    PLAN:  1. Right MCA stroke:  a. Residual deficit: LUE weakness - stable b. Continue aspirin 81 mg daily for secondary stroke prevention.  Plans on following up with cardiology in the near future to discuss statin therapy as this was on hold during chemo c. Declines ongoing EtOH or drug use and highly encouraged continuation of abstinence d. Discussed secondary stroke prevention measures and importance of close PCP follow-up for aggressive stroke risk factor management 2. HTN: BP goal<130/90.  Stable without BP meds monitored by PCP 3. HLD: LDL goal<70.  Plans on follow-up with cardiology for repeat lipid panel and possible initiation of statin as previously on hold during chemo.  Unable to obtain lipid panel today as he is not fasting 4. Ischemic cardiomyopathy and CAD: Monitored by cardiology 5. Non-Hodgkin's lymphoma: Monitored by oncology; completed chemo    Overall stable from stroke standpoint and recommend follow-up on an as-needed basis   I spent 30 minutes of face-to-face and non-face-to-face time with patient.  This included previsit chart review, lab review, study review, order entry, electronic health record documentation, patient education and discussion regarding history of prior stroke, residual deficits, importance of managing stroke risk factors and answered all questions to patient satisfaction   Frann Rider, [facility]  [facility] [address] [place], [place] [address]  Phone [phone] Fax [phone] Note: This document was prepared with digital dictation and possible smart phrase technology. Any transcriptional errors that result from this process are unintentional.

## 2020-02-26 NOTE — Progress Notes (Signed)
I agree with the above plan 

## 2020-03-02 ENCOUNTER — Inpatient Hospital Stay: Payer: Medicaid Other

## 2020-03-02 ENCOUNTER — Other Ambulatory Visit: Payer: Self-pay

## 2020-03-02 ENCOUNTER — Inpatient Hospital Stay: Payer: Medicaid Other | Attending: Internal Medicine

## 2020-03-02 DIAGNOSIS — Z8673 Personal history of transient ischemic attack (TIA), and cerebral infarction without residual deficits: Secondary | ICD-10-CM | POA: Diagnosis not present

## 2020-03-02 DIAGNOSIS — Z95828 Presence of other vascular implants and grafts: Secondary | ICD-10-CM

## 2020-03-02 DIAGNOSIS — C8338 Diffuse large B-cell lymphoma, lymph nodes of multiple sites: Secondary | ICD-10-CM | POA: Insufficient documentation

## 2020-03-02 DIAGNOSIS — Z9221 Personal history of antineoplastic chemotherapy: Secondary | ICD-10-CM | POA: Insufficient documentation

## 2020-03-02 DIAGNOSIS — Z87891 Personal history of nicotine dependence: Secondary | ICD-10-CM | POA: Insufficient documentation

## 2020-03-02 DIAGNOSIS — D72819 Decreased white blood cell count, unspecified: Secondary | ICD-10-CM | POA: Diagnosis not present

## 2020-03-02 DIAGNOSIS — R161 Splenomegaly, not elsewhere classified: Secondary | ICD-10-CM

## 2020-03-02 LAB — CBC WITH DIFFERENTIAL (CANCER CENTER ONLY)
Abs Immature Granulocytes: 0.01 10*3/uL (ref 0.00–0.07)
Basophils Absolute: 0 10*3/uL (ref 0.0–0.1)
Basophils Relative: 1 %
Eosinophils Absolute: 0.1 10*3/uL (ref 0.0–0.5)
Eosinophils Relative: 4 %
HCT: 40.9 % (ref 39.0–52.0)
Hemoglobin: 14.1 g/dL (ref 13.0–17.0)
Immature Granulocytes: 0 %
Lymphocytes Relative: 22 %
Lymphs Abs: 0.6 10*3/uL — ABNORMAL LOW (ref 0.7–4.0)
MCH: 32 pg (ref 26.0–34.0)
MCHC: 34.5 g/dL (ref 30.0–36.0)
MCV: 92.7 fL (ref 80.0–100.0)
Monocytes Absolute: 0.4 10*3/uL (ref 0.1–1.0)
Monocytes Relative: 15 %
Neutro Abs: 1.6 10*3/uL — ABNORMAL LOW (ref 1.7–7.7)
Neutrophils Relative %: 58 %
Platelet Count: 162 10*3/uL (ref 150–400)
RBC: 4.41 MIL/uL (ref 4.22–5.81)
RDW: 14 % (ref 11.5–15.5)
WBC Count: 2.8 10*3/uL — ABNORMAL LOW (ref 4.0–10.5)
nRBC: 0 % (ref 0.0–0.2)

## 2020-03-02 LAB — LACTATE DEHYDROGENASE: LDH: 133 U/L (ref 98–192)

## 2020-03-02 MED ORDER — SODIUM CHLORIDE 0.9% FLUSH
10.0000 mL | Freq: Once | INTRAVENOUS | Status: AC
  Administered 2020-03-02: 10 mL
  Filled 2020-03-02: qty 10

## 2020-03-02 MED ORDER — HEPARIN SOD (PORK) LOCK FLUSH 100 UNIT/ML IV SOLN
500.0000 [IU] | Freq: Once | INTRAVENOUS | Status: AC
  Administered 2020-03-02: 500 [IU]
  Filled 2020-03-02: qty 5

## 2020-03-26 ENCOUNTER — Other Ambulatory Visit: Payer: Self-pay | Admitting: Physician Assistant

## 2020-04-05 ENCOUNTER — Encounter: Payer: Self-pay | Admitting: Cardiology

## 2020-04-05 ENCOUNTER — Other Ambulatory Visit: Payer: Self-pay

## 2020-04-05 ENCOUNTER — Ambulatory Visit (INDEPENDENT_AMBULATORY_CARE_PROVIDER_SITE_OTHER): Payer: Medicaid Other | Admitting: Cardiology

## 2020-04-05 VITALS — BP 90/68 | HR 62 | Ht 67.0 in | Wt 124.0 lb

## 2020-04-05 DIAGNOSIS — I2510 Atherosclerotic heart disease of native coronary artery without angina pectoris: Secondary | ICD-10-CM

## 2020-04-05 DIAGNOSIS — Z79899 Other long term (current) drug therapy: Secondary | ICD-10-CM | POA: Diagnosis not present

## 2020-04-05 DIAGNOSIS — E7800 Pure hypercholesterolemia, unspecified: Secondary | ICD-10-CM

## 2020-04-05 DIAGNOSIS — I5042 Chronic combined systolic (congestive) and diastolic (congestive) heart failure: Secondary | ICD-10-CM

## 2020-04-05 MED ORDER — ROSUVASTATIN CALCIUM 10 MG PO TABS: 10.0000 mg | ORAL_TABLET | Freq: Every day | ORAL | 3 refills | Status: DC

## 2020-04-05 NOTE — Progress Notes (Signed)
Cardiology Office Note:    Date:  04/05/2020   ID:  [name], DOB 1955/04/19, MRN [id number]  PCP:  Patient, No Pcp Per  [facility] Cardiologist:  Candee Furbish, MD  [facility] Electrophysiologist:  None   Referring MD: Elsie Stain, MD     History of Present Illness:    [name] is a 65 y.o. male here for the follow-up of coronary artery disease status post CABG in 2010 with ischemic cardiomyopathy EF 35 to 40%, tobacco use, prior cocaine use.  Previously was lost to follow-up.  He was seen last by Kathyrn Drown, NP on 09/05/2019.  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  Bulky right hilar and central perihilar mass.  Followed by Dr. Julien Nordmann.  Had some neuropathy following chemo.  R-CHOP.  He was living at [facility] while undergoing chemo and radiation.  Bottom of foot burn. ? Chemo  Overall been doing quite well from a heart perspective.  No significant shortness of breath no syncope no chest pain.  Past Medical History:  Diagnosis Date  . CAD (coronary artery disease)    a. 04/2008 Cath: LM nl, LAD 50p, 66m, LCX min irregs, RCA 50p/m, 40d, RPDA 60-70ost; b. 04/2008 CABG x 4: LIMA->LAD, VG->D2, VG->RPDA->RPL.  . Cancer ([place])   . Cocaine abuse ([place])    a. Quit early 2019.  . Ischemic cardiomyopathy    a. LV gram: EF 30-35%, apical/periapical AK.  . Stroke ([place]) 02/2019  . Stroke ([place]) 02/2019  . Tobacco abuse     Past Surgical History:  Procedure Laterality Date  . BYPASS GRAFT  2010  . CARDIAC SURGERY  2010  . IR IMAGING GUIDED PORT INSERTION  05/26/2019  . SUPRACLAVICAL NODE BIOPSY Left 05/08/2019   Procedure: SUPRACLAVICAL NODE BIOPSY;  Surgeon: [name], MD;  Location: [facility] OR;  Service: Thoracic;  Laterality: Left;    Current Medications: Current Meds  Medication Sig  . aspirin EC 81 MG tablet Take 1 tablet (81 mg total) by mouth daily. (Patient taking differently: Take 81 mg by  mouth daily as needed.)  . cromolyn (OPTICROM) 4 % ophthalmic solution Place 2 drops into the right eye 4 (four) times daily.  . rosuvastatin (CRESTOR) 10 MG tablet Take 1 tablet (10 mg total) by mouth daily.     Allergies:   Patient has no known allergies.   Social History   Socioeconomic History  . Marital status: Single    Spouse name: Not on file  . Number of children: Not on file  . Years of education: Not on file  . Highest education level: Not on file  Occupational History  . Not on file  Tobacco Use  . Smoking status: Current Some Day Smoker    Packs/day: 0.30    Types: Cigarettes  . Smokeless tobacco: Current User  . Tobacco comment: has smoked for 40+ yrs - at most 1ppd, currently 1ppwk.  Vaping Use  . Vaping Use: Never used  Substance and Sexual Activity  . Alcohol use: Not Currently    Comment: quite > 30 yrs ago.  . Drug use: Not Currently    Types: Cocaine    Comment: prev used cocaine - quit early 2019.  Marland Kitchen Sexual activity: Not on file  Other Topics Concern  . Not on file  Social History Narrative   Lives in [place] by himself.  Currently staying in a half-way house designed  to help those addicted to drugs get clean, and find housing/employment.  He exercises some - walks/push ups.   Social Determinants of Health   Financial Resource Strain: Not on file  Food Insecurity: Not on file  Transportation Needs: Not on file  Physical Activity: Not on file  Stress: Not on file  Social Connections: Not on file    Family History: The patient's Family history is unknown by patient.  ROS:   Please see the history of present illness.     All other systems reviewed and are negative.  EKGs/Labs/Other Studies Reviewed:    The following studies were reviewed today: Echocardiogram 09/05/2019:  1. Left ventricular ejection fraction, by estimation, is 40 to 45%. The  left ventricle has mildly decreased function. The left ventricle  demonstrates regional wall motion  abnormalities (see scoring  diagram/findings for description). Left ventricular  diastolic parameters are indeterminate.  2. Right ventricular systolic function is normal. The right ventricular  size is normal. There is normal pulmonary artery systolic pressure.  3. The mitral valve is normal in structure. Trivial mitral valve  regurgitation. No evidence of mitral stenosis.  4. The aortic valve is tricuspid. Aortic valve regurgitation is not  visualized. No aortic stenosis is present.  5. Aortic dilatation noted. There is borderline dilatation of the  ascending aorta measuring 37 mm.  6. The inferior vena cava is normal in size with greater than 50%  respiratory variability, suggesting right atrial pressure of 3 mmHg.   FINDINGS  Left Ventricle: Left ventricular ejection fraction, by estimation, is 40  to 45%. The left ventricle has mildly decreased function. The left  ventricle demonstrates regional wall motion abnormalities. Severe  hypokinesis of the left ventricular, mid-apical  anteroseptal wall, apical segment, anterior wall and anterolateral wall.  The left ventricular internal cavity size was normal in size. There is no  left ventricular hypertrophy. Left ventricular diastolic parameters are  indeterminate.   Right Ventricle: The right ventricular size is normal. No increase in  right ventricular wall thickness. Right ventricular systolic function is  normal. There is normal pulmonary artery systolic pressure. The tricuspid  regurgitant velocity is 1.89 m/s, and  with an assumed right atrial pressure of 3 mmHg, the estimated right  ventricular systolic pressure is 78.6 mmHg.   Left Atrium: Left atrial size was normal in size.   Right Atrium: Right atrial size was normal in size.   Pericardium: There is no evidence of pericardial effusion.   Mitral Valve: The mitral valve is normal in structure. There is mild  thickening of the mitral valve leaflet(s). Trivial  mitral valve  regurgitation. No evidence of mitral valve stenosis.   Tricuspid Valve: The tricuspid valve is normal in structure. Tricuspid  valve regurgitation is trivial. No evidence of tricuspid stenosis.   Aortic Valve: The aortic valve is tricuspid. Aortic valve regurgitation is  not visualized. No aortic stenosis is present.   Pulmonic Valve: The pulmonic valve was not well visualized. Pulmonic valve  regurgitation is not visualized. No evidence of pulmonic stenosis.   Aorta: Aortic dilatation noted. There is borderline dilatation of the  ascending aorta measuring 37 mm.   Venous: The inferior vena cava is normal in size with greater than 50%  respiratory variability, suggesting right atrial pressure of 3 mmHg.   IAS/Shunts: No atrial level shunt detected by color flow Doppler.   EKG:  EKG is  ordered today.  The ekg ordered today demonstrates sinus rhythm 62 T wave inversion V3  through V6, no change from prior  Recent Labs: 02/17/2020: ALT 16; BUN 12; Creatinine 1.07; Potassium 3.8; Sodium 142 03/02/2020: Hemoglobin 14.1; Platelet Count 162  Recent Lipid Panel    Component Value Date/Time   CHOL 184 03/05/2019 0443   TRIG 72 03/05/2019 0443   HDL 34 (L) 03/05/2019 0443   CHOLHDL 5.4 03/05/2019 0443   VLDL 14 03/05/2019 0443   LDLCALC 136 (H) 03/05/2019 0443     Risk Assessment/Calculations:       Physical Exam:    VS:  BP 90/68   Pulse 62   Ht 5\' 7"  (1.702 m)   Wt 124 lb (56.2 kg)   BMI 19.42 kg/m     Wt Readings from Last 3 Encounters:  04/05/20 124 lb (56.2 kg)  02/25/20 119 lb (54 kg)  02/17/20 121 lb 14.4 oz (55.3 kg)     GEN:  Well nourished, well developed in no acute distress HEENT: Normal NECK: No JVD; No carotid bruits LYMPHATICS: No lymphadenopathy CARDIAC: RRR, no murmurs, rubs, gallops, Port-A-Cath in place RESPIRATORY:  Clear to auscultation without rales, wheezing or rhonchi  ABDOMEN: Soft, non-tender, non-distended MUSCULOSKELETAL:   No edema; No deformity  SKIN: Warm and dry NEUROLOGIC:  Alert and oriented x 3 PSYCHIATRIC:  Normal affect   ASSESSMENT:    1. Coronary artery disease involving native coronary artery of native heart without angina pectoris   2. Pure hypercholesterolemia   3. Chronic combined systolic (congestive) and diastolic (congestive) heart failure ([place])   4. Medication management    PLAN:    In order of problems listed above:  Coronary artery disease status post CABG - Continue with aggressive secondary risk factor prevention.  Aspirin.  No beta-blocker secondary to hypotension. - Encouraged statin use however deferring after chemo and radiation. -Since he is off of chemo at this point, we will start him on Crestor 10 mg a day and repeat lipid panel and ALT in 3 months.  Ischemic cardiomyopathy - EF 40 to 45% stable.  Most recent echocardiogram reviewed as above.  NYHA class I-II.  Creatinine 1.07, potassium 3.8  Hypotension - Currently not on any antihypertensive therapy secondary to hypotension.  BP has been in the 90s over 60s.  Tobacco use - Continue to encourage cessation.  Hyperlipidemia - Prior LDL 136.  Deferring statin therapy till after chemoradiation.  Discussing at this office visit.  ALT 16  Non-Hodgkin's lymphoma - Followed by Dr. 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  Prior left hemiparesis.  Abnormal EKG - T wave inversions V3 through V6, stable from prior.      Medication Adjustments/Labs and Tests Ordered: Current medicines are reviewed at length with the patient today.  Concerns regarding medicines are outlined above.  Orders Placed This Encounter  Procedures  . ALT  . Lipid panel  . EKG 12-Lead   Meds ordered this encounter  Medications  . rosuvastatin (CRESTOR) 10 MG tablet    Sig: Take 1 tablet (10 mg total) by mouth daily.    Dispense:  90 tablet    Refill:  3    Patient  Instructions  Medication Instructions:  Please start Crestor 10 mg once a day. Continue all other medications as listed.  *If you need a refill on your cardiac medications before your next appointment, please call your pharmacy*  Lab Work: Please return in 3 months for Lipid/ALT.   If you have labs (blood work) drawn  today and your tests are completely normal, you will receive your results only by: Marland Kitchen MyChart Message (if you have MyChart) OR . A paper copy in the mail If you have any lab test that is abnormal or we need to change your treatment, we will call you to review the results.  Follow-Up: At [facility], you and your health needs are our priority.  As part of our continuing mission to provide you with exceptional heart care, we have created designated Provider Care Teams.  These Care Teams include your primary Cardiologist (physician) and Advanced Practice Providers (APPs -  Physician Assistants and Nurse Practitioners) who all work together to provide you with the care you need, when you need it.  We recommend signing up for the patient portal called "MyChart".  Sign up information is provided on this After Visit Summary.  MyChart is used to connect with patients for Virtual Visits (Telemedicine).  Patients are able to view lab/test results, encounter notes, upcoming appointments, etc.  Non-urgent messages can be sent to your provider as well.   To learn more about what you can do with MyChart, go to [email or web address].    Your next appointment:   6 month(s)  The format for your next appointment:   In Person  Provider:   Candee Furbish, MD   Thank you for choosing [facility]!!         Signed, Candee Furbish, MD  04/05/2020 9:06 AM    [place]

## 2020-04-05 NOTE — Patient Instructions (Signed)
Medication Instructions:  Please start Crestor 10 mg once a day. Continue all other medications as listed.  *If you need a refill on your cardiac medications before your next appointment, please call your pharmacy*  Lab Work: Please return in 3 months for Lipid/ALT.   If you have labs (blood work) drawn today and your tests are completely normal, you will receive your results only by: Marland Kitchen MyChart Message (if you have MyChart) OR . A paper copy in the mail If you have any lab test that is abnormal or we need to change your treatment, we will call you to review the results.  Follow-Up: At [facility], you and your health needs are our priority.  As part of our continuing mission to provide you with exceptional heart care, we have created designated Provider Care Teams.  These Care Teams include your primary Cardiologist (physician) and Advanced Practice Providers (APPs -  Physician Assistants and Nurse Practitioners) who all work together to provide you with the care you need, when you need it.  We recommend signing up for the patient portal called "MyChart".  Sign up information is provided on this After Visit Summary.  MyChart is used to connect with patients for Virtual Visits (Telemedicine).  Patients are able to view lab/test results, encounter notes, upcoming appointments, etc.  Non-urgent messages can be sent to your provider as well.   To learn more about what you can do with MyChart, go to [email or web address].    Your next appointment:   6 month(s)  The format for your next appointment:   In Person  Provider:   Candee Furbish, MD   Thank you for choosing [facility]!!

## 2020-04-13 ENCOUNTER — Other Ambulatory Visit: Payer: Self-pay

## 2020-04-13 ENCOUNTER — Other Ambulatory Visit: Payer: Self-pay | Admitting: Medical Oncology

## 2020-04-13 ENCOUNTER — Inpatient Hospital Stay: Payer: Medicaid Other | Attending: Internal Medicine

## 2020-04-13 DIAGNOSIS — C8338 Diffuse large B-cell lymphoma, lymph nodes of multiple sites: Secondary | ICD-10-CM | POA: Diagnosis present

## 2020-04-13 DIAGNOSIS — Z95828 Presence of other vascular implants and grafts: Secondary | ICD-10-CM

## 2020-04-13 DIAGNOSIS — Z452 Encounter for adjustment and management of vascular access device: Secondary | ICD-10-CM | POA: Diagnosis present

## 2020-04-13 MED ORDER — HEPARIN SOD (PORK) LOCK FLUSH 100 UNIT/ML IV SOLN
500.0000 [IU] | Freq: Once | INTRAVENOUS | Status: AC
  Administered 2020-04-13: 500 [IU]
  Filled 2020-04-13: qty 5

## 2020-04-13 MED ORDER — LIDOCAINE-PRILOCAINE 2.5-2.5 % EX CREA: 1.0000 "application " | TOPICAL_CREAM | CUTANEOUS | 0 refills | Status: DC | PRN

## 2020-04-13 MED ORDER — SODIUM CHLORIDE 0.9% FLUSH
10.0000 mL | Freq: Once | INTRAVENOUS | Status: AC
  Administered 2020-04-13: 10 mL
  Filled 2020-04-13: qty 10

## 2020-05-17 ENCOUNTER — Inpatient Hospital Stay: Payer: Medicaid Other | Attending: Internal Medicine

## 2020-05-17 ENCOUNTER — Other Ambulatory Visit: Payer: Self-pay

## 2020-05-17 ENCOUNTER — Ambulatory Visit (HOSPITAL_COMMUNITY)
Admission: RE | Admit: 2020-05-17 | Discharge: 2020-05-17 | Disposition: A | Discharge status: Home / Self Care | Payer: Medicaid Other | Source: Ambulatory Visit | Attending: Internal Medicine | Admitting: Internal Medicine

## 2020-05-17 DIAGNOSIS — R161 Splenomegaly, not elsewhere classified: Secondary | ICD-10-CM | POA: Insufficient documentation

## 2020-05-17 DIAGNOSIS — Z8572 Personal history of non-Hodgkin lymphomas: Secondary | ICD-10-CM | POA: Diagnosis not present

## 2020-05-17 DIAGNOSIS — Z9221 Personal history of antineoplastic chemotherapy: Secondary | ICD-10-CM | POA: Diagnosis not present

## 2020-05-17 LAB — CBC WITH DIFFERENTIAL (CANCER CENTER ONLY)
Abs Immature Granulocytes: 0.01 10*3/uL (ref 0.00–0.07)
Basophils Absolute: 0 10*3/uL (ref 0.0–0.1)
Basophils Relative: 1 %
Eosinophils Absolute: 0.1 10*3/uL (ref 0.0–0.5)
Eosinophils Relative: 3 %
HCT: 45.1 % (ref 39.0–52.0)
Hemoglobin: 15.2 g/dL (ref 13.0–17.0)
Immature Granulocytes: 0 %
Lymphocytes Relative: 26 %
Lymphs Abs: 0.8 10*3/uL (ref 0.7–4.0)
MCH: 32.3 pg (ref 26.0–34.0)
MCHC: 33.7 g/dL (ref 30.0–36.0)
MCV: 96 fL (ref 80.0–100.0)
Monocytes Absolute: 0.3 10*3/uL (ref 0.1–1.0)
Monocytes Relative: 9 %
Neutro Abs: 2 10*3/uL (ref 1.7–7.7)
Neutrophils Relative %: 61 %
Platelet Count: 172 10*3/uL (ref 150–400)
RBC: 4.7 MIL/uL (ref 4.22–5.81)
RDW: 13.5 % (ref 11.5–15.5)
WBC Count: 3.2 10*3/uL — ABNORMAL LOW (ref 4.0–10.5)
nRBC: 0 % (ref 0.0–0.2)

## 2020-05-17 LAB — CMP (CANCER CENTER ONLY)
ALT: 38 U/L (ref 0–44)
AST: 25 U/L (ref 15–41)
Albumin: 4.1 g/dL (ref 3.5–5.0)
Alkaline Phosphatase: 77 U/L (ref 38–126)
Anion gap: 11 (ref 5–15)
BUN: 16 mg/dL (ref 8–23)
CO2: 24 mmol/L (ref 22–32)
Calcium: 9.2 mg/dL (ref 8.9–10.3)
Chloride: 109 mmol/L (ref 98–111)
Creatinine: 0.98 mg/dL (ref 0.61–1.24)
GFR, Estimated: >60 mL/min (ref >60)
Glucose, Bld: 92 mg/dL (ref 70–99)
Potassium: 4 mmol/L (ref 3.5–5.1)
Sodium: 144 mmol/L (ref 135–145)
Total Bilirubin: 0.7 mg/dL (ref 0.3–1.2)
Total Protein: 7.3 g/dL (ref 6.5–8.1)

## 2020-05-17 LAB — LACTATE DEHYDROGENASE: LDH: 115 U/L (ref 98–192)

## 2020-05-17 MED ORDER — IOHEXOL 9 MG/ML PO SOLN
ORAL | Status: AC
  Filled 2020-05-17: qty 1000

## 2020-05-17 MED ORDER — IOHEXOL 300 MG/ML  SOLN
100.0000 mL | Freq: Once | INTRAMUSCULAR | Status: AC | PRN
  Administered 2020-05-17: 100 mL via INTRAVENOUS

## 2020-05-17 MED ORDER — IOHEXOL 9 MG/ML PO SOLN
1000.0000 mL | ORAL | Status: AC
  Administered 2020-05-17: 1000 mL via ORAL

## 2020-05-17 MED ORDER — IOHEXOL 9 MG/ML PO SOLN: 500.0000 mL | ORAL | Status: AC

## 2020-05-19 ENCOUNTER — Other Ambulatory Visit: Payer: Self-pay

## 2020-05-19 ENCOUNTER — Telehealth: Payer: Self-pay | Admitting: Medical Oncology

## 2020-05-19 ENCOUNTER — Inpatient Hospital Stay (HOSPITAL_BASED_OUTPATIENT_CLINIC_OR_DEPARTMENT_OTHER): Payer: Medicaid Other | Admitting: Internal Medicine

## 2020-05-19 VITALS — BP 98/67 | HR 67 | Temp 98.0°F | Resp 14 | Ht 67.0 in | Wt 122.7 lb

## 2020-05-19 DIAGNOSIS — R590 Localized enlarged lymph nodes: Secondary | ICD-10-CM | POA: Diagnosis not present

## 2020-05-19 DIAGNOSIS — C8258 Diffuse follicle center lymphoma, lymph nodes of multiple sites: Secondary | ICD-10-CM

## 2020-05-19 DIAGNOSIS — Z8572 Personal history of non-Hodgkin lymphomas: Secondary | ICD-10-CM | POA: Diagnosis not present

## 2020-05-19 NOTE — Telephone Encounter (Signed)
called for transportation.

## 2020-05-19 NOTE — Progress Notes (Signed)
[facility] Telephone:([phone]) [phone]   Fax:([phone]) [phone]  OFFICE PROGRESS NOTE  Patient, No Pcp Per No address on file  DIAGNOSIS: Stage IV large B cell non-Hodgkin lymphoma presented with bulky right hilar and central perihilar mass involving the right upper lobe and right middle lobe with massive enlargement of the spleen as well as lymphadenopathy as well as central necrotic lymphadenopathy in the left para-aortic, left common iliac, left external iliac and left inguinal chain diagnosed in February 2021.    PRIOR THERAPY: Systemic chemotherapy with R-CHOP every 3 weeks.  First dose May 20, 2019.  Status post 8 cycles.   CURRENT THERAPY: Observation.  INTERVAL HISTORY: [name] 64 y.o. male returns to the clinic today for 6 months follow-up visit.  The patient is feeling fine today with no concerning complaints.  He denied having any current chest pain, shortness of breath, cough or hemoptysis.  He denied having any recent weight loss or night sweats.  He has no nausea, vomiting, diarrhea or constipation.  He has no headache or visual changes.  He is currently on observation and the patient had repeat CT scan of the chest, abdomen pelvis performed recently and he is here for evaluation and discussion of his scan results.   MEDICAL HISTORY: Past Medical History:  Diagnosis Date  . CAD (coronary artery disease)    a. 04/2008 Cath: LM nl, LAD 50p, 26m, LCX min irregs, RCA 50p/m, 40d, RPDA 60-70ost; b. 04/2008 CABG x 4: LIMA->LAD, VG->D2, VG->RPDA->RPL.  . Cancer ([place])   . Cocaine abuse ([place])    a. Quit early 2019.  . Ischemic cardiomyopathy    a. LV gram: EF 30-35%, apical/periapical AK.  . Stroke ([place]) 02/2019  . Stroke ([place]) 02/2019  . Tobacco abuse     ALLERGIES:  has No Known Allergies.  MEDICATIONS:  Current Outpatient Medications  Medication Sig Dispense Refill  . cromolyn (OPTICROM) 4 % ophthalmic solution Place 2 drops into the right eye 4  (four) times daily. 10 mL 3  . lidocaine-prilocaine (EMLA) cream Apply 1 application topically as needed. 30 g 0  . rosuvastatin (CRESTOR) 10 MG tablet Take 1 tablet (10 mg total) by mouth daily. 90 tablet 3  . aspirin EC 81 MG tablet Take 1 tablet (81 mg total) by mouth daily. (Patient not taking: Reported on 05/19/2020) 30 tablet 3   No current facility-administered medications for this visit.    SURGICAL HISTORY:  Past Surgical History:  Procedure Laterality Date  . BYPASS GRAFT  2010  . CARDIAC SURGERY  2010  . IR IMAGING GUIDED PORT INSERTION  05/26/2019  . SUPRACLAVICAL NODE BIOPSY Left 05/08/2019   Procedure: SUPRACLAVICAL NODE BIOPSY;  Surgeon: [name], MD;  Location: [facility] OR;  Service: Thoracic;  Laterality: Left;    REVIEW OF SYSTEMS:  A comprehensive review of systems was negative.   PHYSICAL EXAMINATION: General appearance: alert, cooperative and no distress Head: Normocephalic, without obvious abnormality, atraumatic Neck: no adenopathy, no JVD, supple, symmetrical, trachea midline and thyroid not enlarged, symmetric, no tenderness/mass/nodules Lymph nodes: Cervical, supraclavicular, and axillary nodes normal. Resp: clear to auscultation bilaterally Back: symmetric, no curvature. ROM normal. No CVA tenderness. Cardio: regular rate and rhythm, S1, S2 normal, no murmur, click, rub or gallop GI: soft, non-tender; bowel sounds normal; no masses,  no organomegaly Extremities: extremities normal, atraumatic, no cyanosis or edema  ECOG PERFORMANCE STATUS: 1 - Symptomatic but completely ambulatory  Blood pressure 98/67, pulse 67,  temperature 98 F (36.7 C), temperature source Tympanic, resp. rate 14, height 5\' 7"  (1.702 m), weight 122 lb 11.2 oz (55.7 kg), SpO2 99 %.  LABORATORY DATA: Lab Results  Component Value Date   WBC 3.2 (L) 05/17/2020   HGB 15.2 05/17/2020   HCT 45.1 05/17/2020   MCV 96.0 05/17/2020   PLT 172 05/17/2020      Chemistry      Component  Value Date/Time   NA 144 05/17/2020 0954   K 4.0 05/17/2020 0954   CL 109 05/17/2020 0954   CO2 24 05/17/2020 0954   BUN 16 05/17/2020 0954   CREATININE 0.98 05/17/2020 0954      Component Value Date/Time   CALCIUM 9.2 05/17/2020 0954   ALKPHOS 77 05/17/2020 0954   AST 25 05/17/2020 0954   ALT 38 05/17/2020 0954   BILITOT 0.7 05/17/2020 0954       RADIOGRAPHIC STUDIES: CT Chest W Contrast  Result Date: 05/18/2020 CLINICAL DATA:  Stage IV large B-cell lymphoma. Bulky RIGHT hilar and central perihilar mass. Splenic enlargement EXAM: CT CHEST, ABDOMEN, AND PELVIS WITH CONTRAST TECHNIQUE: Multidetector CT imaging of the chest, abdomen and pelvis was performed following the standard protocol during bolus administration of intravenous contrast. CONTRAST:  139mL OMNIPAQUE IOHEXOL 300 MG/ML  SOLN COMPARISON:  PET-CT 11/14/2019, CT 09/16/2019 FINDINGS: CT CHEST FINDINGS Cardiovascular: Post CABG. Port in the anterior chest wall with tip in distal SVC. Mediastinum/Nodes: No axillary or supraclavicular adenopathy. No mediastinal or hilar adenopathy. No pericardial fluid. Esophagus normal. Lungs/Pleura: 6 mm LEFT upper lobe pulmonary nodule (image 72/7) unchanged. Subpleural nodule in the LEFT upper lobe measuring 4 mm unchanged. No new nodularity. Perihilar bronchiectasis in the RIGHT middle lobe unchanged. Musculoskeletal: No aggressive osseous lesion. CT ABDOMEN AND PELVIS FINDINGS Hepatobiliary: No focal hepatic lesion. No biliary ductal dilatation. Gallbladder is normal. Common bile duct is normal. Pancreas: Pancreas is normal. No ductal dilatation. No pancreatic inflammation. Spleen: Normal volume spleen. Low-density lesions in the spleen are unchanged from prior. Adrenals/urinary tract: Adrenal glands and kidneys are normal. The ureters and bladder normal. Stomach/Bowel: Stomach, small bowel, appendix, and cecum are normal. The colon and rectosigmoid colon are normal. Vascular/Lymphatic: Abdominal  aorta is normal caliber. There is no retroperitoneal or periportal lymphadenopathy. No pelvic lymphadenopathy. Reproductive: Prostate unremarkable Other: No free fluid. Musculoskeletal: No aggressive osseous lesion. Remote RIGHT hip fracture. IMPRESSION: Chest Impression: *No evidence of lymphoma recurrence in thorax. *Stable LEFT lung pulmonary nodules. *Stable bronchiectasis in the RIGHT middle lobe. Abdomen / Pelvis Impression: 1. No lymphadenopathy in the abdomen pelvis. 2. Normal volume spleen. 3. No evidence of lymphoma recurrence. Electronically Signed   By: Suzy Bouchard M.D.   On: 05/18/2020 10:25   CT Abdomen Pelvis W Contrast  Result Date: 05/18/2020 CLINICAL DATA:  Stage IV large B-cell lymphoma. Bulky RIGHT hilar and central perihilar mass. Splenic enlargement EXAM: CT CHEST, ABDOMEN, AND PELVIS WITH CONTRAST TECHNIQUE: Multidetector CT imaging of the chest, abdomen and pelvis was performed following the standard protocol during bolus administration of intravenous contrast. CONTRAST:  147mL OMNIPAQUE IOHEXOL 300 MG/ML  SOLN COMPARISON:  PET-CT 11/14/2019, CT 09/16/2019 FINDINGS: CT CHEST FINDINGS Cardiovascular: Post CABG. Port in the anterior chest wall with tip in distal SVC. Mediastinum/Nodes: No axillary or supraclavicular adenopathy. No mediastinal or hilar adenopathy. No pericardial fluid. Esophagus normal. Lungs/Pleura: 6 mm LEFT upper lobe pulmonary nodule (image 72/7) unchanged. Subpleural nodule in the LEFT upper lobe measuring 4 mm unchanged. No new nodularity. Perihilar bronchiectasis in the  RIGHT middle lobe unchanged. Musculoskeletal: No aggressive osseous lesion. CT ABDOMEN AND PELVIS FINDINGS Hepatobiliary: No focal hepatic lesion. No biliary ductal dilatation. Gallbladder is normal. Common bile duct is normal. Pancreas: Pancreas is normal. No ductal dilatation. No pancreatic inflammation. Spleen: Normal volume spleen. Low-density lesions in the spleen are unchanged from prior.  Adrenals/urinary tract: Adrenal glands and kidneys are normal. The ureters and bladder normal. Stomach/Bowel: Stomach, small bowel, appendix, and cecum are normal. The colon and rectosigmoid colon are normal. Vascular/Lymphatic: Abdominal aorta is normal caliber. There is no retroperitoneal or periportal lymphadenopathy. No pelvic lymphadenopathy. Reproductive: Prostate unremarkable Other: No free fluid. Musculoskeletal: No aggressive osseous lesion. Remote RIGHT hip fracture. IMPRESSION: Chest Impression: *No evidence of lymphoma recurrence in thorax. *Stable LEFT lung pulmonary nodules. *Stable bronchiectasis in the RIGHT middle lobe. Abdomen / Pelvis Impression: 1. No lymphadenopathy in the abdomen pelvis. 2. Normal volume spleen. 3. No evidence of lymphoma recurrence. Electronically Signed   By: Suzy Bouchard M.D.   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  The patient completed course of systemic chemotherapy with R-CHOP status post 8 cycles.  He tolerated his treatment fairly well with no significant adverse effects. The patient is currently on observation and he is feeling fine today with no concerning complaints. He had repeat CT scan of the chest, abdomen pelvis performed recently.  I personally and independently reviewed the scans and discussed the results with the patient today. His scan showed no concerning findings for disease recurrence or metastasis. I recommended for him to continue on observation with repeat follow-up visit and lab work in 6 months. The patient was advised to call immediately if he has any concerning symptoms in the interval. The  patient voices understanding of current disease status and treatment options and is in agreement with the current care plan.  All questions were answered. The patient knows to call the clinic with any problems, questions or concerns. We can certainly see the patient much sooner if necessary.   Disclaimer: This note was dictated with voice recognition software. Similar sounding words can inadvertently be transcribed and may not be corrected upon review.

## 2020-05-20 ENCOUNTER — Telehealth: Payer: Self-pay | Admitting: Internal Medicine

## 2020-05-20 NOTE — Telephone Encounter (Signed)
Scheduled appts per 2/23 los. Unable to leave voicemail. Pt to get updated appt calendar at next visit per appt notes.

## 2020-05-25 ENCOUNTER — Other Ambulatory Visit: Payer: Self-pay | Admitting: Medical Oncology

## 2020-05-25 ENCOUNTER — Inpatient Hospital Stay: Payer: Medicaid Other | Attending: Internal Medicine

## 2020-05-25 ENCOUNTER — Other Ambulatory Visit: Payer: Self-pay

## 2020-05-25 DIAGNOSIS — Z8572 Personal history of non-Hodgkin lymphomas: Secondary | ICD-10-CM | POA: Insufficient documentation

## 2020-05-25 DIAGNOSIS — Z95828 Presence of other vascular implants and grafts: Secondary | ICD-10-CM

## 2020-05-25 DIAGNOSIS — Z452 Encounter for adjustment and management of vascular access device: Secondary | ICD-10-CM | POA: Insufficient documentation

## 2020-05-25 DIAGNOSIS — C8338 Diffuse large B-cell lymphoma, lymph nodes of multiple sites: Secondary | ICD-10-CM

## 2020-05-25 MED ORDER — SODIUM CHLORIDE 0.9% FLUSH
10.0000 mL | Freq: Once | INTRAVENOUS | Status: AC
  Administered 2020-05-25: 10 mL
  Filled 2020-05-25: qty 10

## 2020-05-25 MED ORDER — LIDOCAINE-PRILOCAINE 2.5-2.5 % EX CREA: 1.0000 "application " | TOPICAL_CREAM | CUTANEOUS | 1 refills | Status: DC | PRN

## 2020-05-25 MED ORDER — HEPARIN SOD (PORK) LOCK FLUSH 100 UNIT/ML IV SOLN
500.0000 [IU] | Freq: Once | INTRAVENOUS | Status: AC
Start: 2020-05-25 — End: 2020-05-25
  Administered 2020-05-25: 500 [IU]
  Filled 2020-05-25: qty 5

## 2020-05-25 NOTE — Patient Instructions (Signed)
Implanted Port Insertion, Care After This sheet gives you information about how to care for yourself after your procedure. Your health care provider may also give you more specific instructions. If you have problems or questions, contact your health care provider. What can I expect after the procedure? After the procedure, it is common to have:  Discomfort at the port insertion site.  Bruising on the skin over the port. This should improve over 3-4 days. Follow these instructions at home: Port care  After your port is placed, you will get a manufacturer's information card. The card has information about your port. Keep this card with you at all times.  Take care of the port as told by your health care provider. Ask your health care provider if you or a family member can get training for taking care of the port at home. A home health care nurse may also take care of the port.  Make sure to remember what type of port you have. Incision care  Follow instructions from your health care provider about how to take care of your port insertion site. Make sure you: ? Wash your hands with soap and water before and after you change your bandage (dressing). If soap and water are not available, use hand sanitizer. ? Change your dressing as told by your health care provider. ? Leave stitches (sutures), skin glue, or adhesive strips in place. These skin closures may need to stay in place for 2 weeks or longer. If adhesive strip edges start to loosen and curl up, you may trim the loose edges. Do not remove adhesive strips completely unless your health care provider tells you to do that.  Check your port insertion site every day for signs of infection. Check for: ? Redness, swelling, or pain. ? Fluid or blood. ? Warmth. ? Pus or a bad smell.      Activity  Return to your normal activities as told by your health care provider. Ask your health care provider what activities are safe for you.  Do not  lift anything that is heavier than 10 lb (4.5 kg), or the limit that you are told, until your health care provider says that it is safe. General instructions  Take over-the-counter and prescription medicines only as told by your health care provider.  Do not take baths, swim, or use a hot tub until your health care provider approves. Ask your health care provider if you may take showers. You may only be allowed to take sponge baths.  Do not drive for 24 hours if you were given a sedative during your procedure.  Wear a medical alert bracelet in case of an emergency. This will tell any health care providers that you have a port.  Keep all follow-up visits as told by your health care provider. This is important. Contact a health care provider if:  You cannot flush your port with saline as directed, or you cannot draw blood from the port.  You have a fever or chills.  You have redness, swelling, or pain around your port insertion site.  You have fluid or blood coming from your port insertion site.  Your port insertion site feels warm to the touch.  You have pus or a bad smell coming from the port insertion site. Get help right away if:  You have chest pain or shortness of breath.  You have bleeding from your port that you cannot control. Summary  Take care of the port as told by your   health care provider. Keep the manufacturer's information card with you at all times.  Change your dressing as told by your health care provider.  Contact a health care provider if you have a fever or chills or if you have redness, swelling, or pain around your port insertion site.  Keep all follow-up visits as told by your health care provider. This information is not intended to replace advice given to you by your health care provider. Make sure you discuss any questions you have with your health care provider. Document Revised: 10/09/2017 Document Reviewed: 10/09/2017 Elsevier Patient Education   2021 [employer] Inc.  

## 2020-06-07 ENCOUNTER — Other Ambulatory Visit: Payer: Self-pay

## 2020-06-07 ENCOUNTER — Encounter (HOSPITAL_COMMUNITY): Payer: Self-pay | Admitting: Emergency Medicine

## 2020-06-07 ENCOUNTER — Emergency Department (HOSPITAL_COMMUNITY)
Admission: EM | Admit: 2020-06-07 | Discharge: 2020-06-07 | Disposition: A | Discharge status: Home / Self Care | Payer: Medicaid Other | Attending: Emergency Medicine | Admitting: Emergency Medicine

## 2020-06-07 ENCOUNTER — Emergency Department (HOSPITAL_COMMUNITY): Payer: Medicaid Other

## 2020-06-07 DIAGNOSIS — I2510 Atherosclerotic heart disease of native coronary artery without angina pectoris: Secondary | ICD-10-CM | POA: Diagnosis not present

## 2020-06-07 DIAGNOSIS — M549 Dorsalgia, unspecified: Secondary | ICD-10-CM | POA: Diagnosis not present

## 2020-06-07 DIAGNOSIS — F17210 Nicotine dependence, cigarettes, uncomplicated: Secondary | ICD-10-CM | POA: Diagnosis not present

## 2020-06-07 DIAGNOSIS — Z859 Personal history of malignant neoplasm, unspecified: Secondary | ICD-10-CM | POA: Insufficient documentation

## 2020-06-07 DIAGNOSIS — I110 Hypertensive heart disease with heart failure: Secondary | ICD-10-CM | POA: Diagnosis not present

## 2020-06-07 DIAGNOSIS — Z79899 Other long term (current) drug therapy: Secondary | ICD-10-CM | POA: Diagnosis not present

## 2020-06-07 DIAGNOSIS — I5042 Chronic combined systolic (congestive) and diastolic (congestive) heart failure: Secondary | ICD-10-CM | POA: Insufficient documentation

## 2020-06-07 DIAGNOSIS — M542 Cervicalgia: Secondary | ICD-10-CM | POA: Insufficient documentation

## 2020-06-07 DIAGNOSIS — Z951 Presence of aortocoronary bypass graft: Secondary | ICD-10-CM | POA: Insufficient documentation

## 2020-06-07 MED ORDER — PREDNISONE 20 MG PO TABS: 20.0000 mg | ORAL_TABLET | Freq: Every day | ORAL | 0 refills | Status: AC

## 2020-06-07 MED ORDER — CYCLOBENZAPRINE HCL 10 MG PO TABS: 10.0000 mg | ORAL_TABLET | Freq: Two times a day (BID) | ORAL | 0 refills | Status: DC | PRN

## 2020-06-07 NOTE — ED Notes (Signed)
Patient worried his ride is going leave him, called his ride at [phone] and explained we are doing a CT scan and she said that is fine and she will come back and get him

## 2020-06-07 NOTE — ED Notes (Signed)
Ambulatory to restroom with assistance.

## 2020-06-07 NOTE — ED Provider Notes (Signed)
[facility] DEPT Provider Note   CSN: [id number] Arrival date & time: 06/07/20  1141     History Chief Complaint  Patient presents with  . back and neck pain    [name] is a 65 y.o. male.  HPI    Patient with significant medical history of CAD, stage V non-Hodgkin's lymphoma, currently not on chemotherapy presents with chief complaint of neck and back pain.  He endorses pain started originally 1 week ago and wosened over the weekend.  He describes a dull throbbing sensation on the left side of his neck as well as the middle of his neck, denies recent trauma to the area, no history of autoimmune diseases, never had chronic neck or back pain in the past.  He denies paresthesias or weakness in the upper or lower extremities, denies difficulty with neck movement.  Reviewing patient's chart he had a PET scan performed on 11/14/2019 does not show signs of lesions on the spine or new lesions.  Patient is scheduled to follow-up with Dr. Earlie Server next week.  Patient denies alleviating factors.  Patient denies headaches, fevers, chills, shortness of breath, chest pain, abdominal pain, nausea, vomiting, diarrhea, worsening pedal edema.  Past Medical History:  Diagnosis Date  . CAD (coronary artery disease)    a. 04/2008 Cath: LM nl, LAD 50p, 68m, LCX min irregs, RCA 50p/m, 40d, RPDA 60-70ost; b. 04/2008 CABG x 4: LIMA->LAD, VG->D2, VG->RPDA->RPL.  . Cancer ([place])   . Cocaine abuse ([place])    a. Quit early 2019.  . Ischemic cardiomyopathy    a. LV gram: EF 30-35%, apical/periapical AK.  . Stroke ([place]) 02/2019  . Stroke ([place]) 02/2019  . Tobacco abuse     Patient Active Problem List   Diagnosis Date Noted  . Port-A-Cath in place 09/09/2019  . Cancer related pain 07/01/2019  . Non-Hodgkin lymphoma ([place]) 05/13/2019  . Goals of care, counseling/discussion 05/13/2019  . Encounter for antineoplastic chemotherapy 05/13/2019  . Mass of upper lobe of right lung  04/03/2019  . Lymphadenopathy, inguinal 04/03/2019  . Splenomegaly 04/03/2019  . Lung mass 03/24/2019  . Slow transit constipation   . Inguinal mass   . Chronic combined systolic and diastolic CHF (congestive heart failure) ([place])   . Mass of left inguinal region   . Greater trochanteric bursitis of left hip   . Hyponatremia   . Acute blood loss anemia   . Chronic combined systolic (congestive) and diastolic (congestive) heart failure ([place])   . Essential hypertension   . Left leg pain   . 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 Left hemiparesis ([place]) 03/06/2019  . Acute cerebrovascular accident (CVA) ([place]) 03/04/2019  . Chest pain 10/16/2017  . Hyperlipidemia 06/16/2008  . TOBACCO ABUSE 06/16/2008  . MARIJUANA ABUSE 06/16/2008  . Cocaine abuse ([place]) 06/16/2008  . CAD, ARTERY BYPASS GRAFT 04/30/2008    Past Surgical History:  Procedure Laterality Date  . BYPASS GRAFT  2010  . CARDIAC SURGERY  2010  . IR IMAGING GUIDED PORT INSERTION  05/26/2019  . SUPRACLAVICAL NODE BIOPSY Left 05/08/2019   Procedure: SUPRACLAVICAL NODE BIOPSY;  Surgeon: [name], MD;  Location: [facility] OR;  Service: Thoracic;  Laterality: Left;       Family History  Family history unknown: Yes    Social History   Tobacco Use  . Smoking status: Current Some Day Smoker    Packs/day: 0.30    Types: Cigarettes  . Smokeless tobacco: Current User  .  Tobacco comment: has smoked for 40+ yrs - at most 1ppd, currently 1ppwk.  Vaping Use  . Vaping Use: Never used  Substance Use Topics  . Alcohol use: Not Currently    Comment: quite > 30 yrs ago.  . Drug use: Not Currently    Types: Cocaine    Comment: prev used cocaine - quit early 2019.    Home Medications Prior to Admission medications   Medication Sig Start Date End Date Taking? Authorizing Provider  cyclobenzaprine (FLEXERIL) 10 MG tablet Take 1 tablet (10 mg total) by mouth 2 (two) times daily as needed for muscle spasms.  06/07/20  Yes Marcello Fennel, PA-C  predniSONE (DELTASONE) 20 MG tablet Take 1 tablet (20 mg total) by mouth daily for 5 days. 06/07/20 06/12/20 Yes Marcello Fennel, PA-C  aspirin EC 81 MG tablet Take 1 tablet (81 mg total) by mouth daily. Patient not taking: Reported on 05/19/2020 04/22/19   Frann Rider, NP  cromolyn (OPTICROM) 4 % ophthalmic solution Place 2 drops into the right eye 4 (four) times daily. 10/28/19   Tanner, Lyndon Code., PA-C  lidocaine-prilocaine (EMLA) cream Apply 1 application topically as needed. 05/25/20   [name], MD  rosuvastatin (CRESTOR) 10 MG tablet Take 1 tablet (10 mg total) by mouth daily. 04/05/20   Jerline Pain, MD    Allergies    Patient has no known allergies.  Review of Systems   Review of Systems  Constitutional: Negative for chills and fever.  HENT: Negative for congestion.   Respiratory: Negative for shortness of breath.   Cardiovascular: Negative for chest pain.  Gastrointestinal: Negative for abdominal pain.  Genitourinary: Negative for enuresis.  Musculoskeletal: Positive for back pain and neck pain.  Skin: Negative for rash.  Neurological: Negative for headaches.  Hematological: Does not bruise/bleed easily.    Physical Exam Updated Vital Signs BP 110/88   Pulse 68   Temp 98 F (36.7 C)   Resp 20   Ht 5\' 7"  (1.702 m)   Wt 55.3 kg   SpO2 100%   BMI 19.11 kg/m   Physical Exam Vitals and nursing note reviewed.  Constitutional:      General: He is not in acute distress.    Appearance: He is not ill-appearing.  HENT:     Head: Normocephalic and atraumatic.     Nose: No congestion.     Mouth/Throat:     Mouth: Mucous membranes are moist.     Pharynx: Oropharynx is clear. No oropharyngeal exudate or posterior oropharyngeal erythema.  Eyes:     Conjunctiva/sclera: Conjunctivae normal.  Neck:     Comments: No torticollis present, he had slight tenderness to location on his cervical spine on C6 and C7 as well as T1 and T2.   No crepitus or deformities present. Cardiovascular:     Rate and Rhythm: Normal rate and regular rhythm.     Pulses: Normal pulses.     Heart sounds: No murmur heard. No friction rub. No gallop.   Pulmonary:     Effort: No respiratory distress.     Breath sounds: No wheezing, rhonchi or rales.  Abdominal:     Palpations: Abdomen is soft.     Tenderness: There is no abdominal tenderness.  Musculoskeletal:     Comments: Patient is moving all 4 extremities at difficulty.  Skin:    General: Skin is warm and dry.  Neurological:     Mental Status: He is alert.  Psychiatric:  Mood and Affect: Mood normal.     ED Results / Procedures / Treatments   Labs (all labs ordered are listed, but only abnormal results are displayed) Labs Reviewed - No data to display  EKG None  Radiology CT Cervical Spine Wo Contrast  Result Date: 06/07/2020 CLINICAL DATA:  Neck pain. History of diffuse large B-cell lymphoma. EXAM: CT CERVICAL SPINE WITHOUT CONTRAST TECHNIQUE: Multidetector CT imaging of the cervical spine was performed without intravenous contrast. Multiplanar CT image reconstructions were also generated. COMPARISON:  PET-CT 11/14/2019 CT scans 05/17/2020 FINDINGS: Alignment: Moderate right convex scoliosis. Straightening of the normal cervical lordosis with age advanced degenerative cervical spondylosis. Skull base and vertebrae: No acute cervical spine fracture or worrisome bone lesions. Soft tissues and spinal canal: No prevertebral fluid or swelling. No visible canal hematoma. Disc levels: Advanced degenerative cervical spondylosis with multilevel disc disease and facet disease. No signal spinal stenosis or large/obvious disc protrusions. There is multilevel foraminal narrowing due to uncinate spurring and facet disease. Upper chest: The lung apices are grossly clear.  Mild tracheomegaly. Other: Right-sided central venous catheter is unremarkable. No neck mass or adenopathy. Bilateral  carotid artery calcifications are noted. IMPRESSION: 1. Advanced degenerative cervical spondylosis with multilevel disc disease and facet disease. 2. No acute cervical spine fracture or worrisome bone lesions. Electronically Signed   By: Marijo Sanes M.D.   On: 06/07/2020 14:18   CT Thoracic Spine Wo Contrast  Result Date: 06/07/2020 CLINICAL DATA:  Onset back pain when the patient woke up this morning. No known injury. EXAM: CT THORACIC SPINE WITHOUT CONTRAST TECHNIQUE: Multidetector CT images of the thoracic were obtained using the standard protocol without intravenous contrast. COMPARISON:  CT chest, abdomen and pelvis 05/17/2020. FINDINGS: Alignment: Normal. Vertebrae: No fracture or focal pathologic process. Paraspinal and other soft tissues: No acute or focal abnormality. Disc levels: Mild loss of disc space height and endplate spurring are most notable at C7-T1, T8-9 and T9-10. IMPRESSION: No acute abnormality. No finding to explain the patient's symptoms. No change in scattered degenerative disease. Electronically Signed   By: Inge Rise M.D.   On: 06/07/2020 14:08    Procedures Procedures   Medications Ordered in ED Medications - No data to display  ED Course  I have reviewed the triage vital signs and the nursing notes.  Pertinent labs & imaging results that were available during my care of the patient were reviewed by me and considered in my medical decision making (see chart for details).    MDM Rules/Calculators/A&P                         Initial impression-patient presents with new onset of neck and back pain.  He is alert, does not appear in acute distress, vital signs reassuring.  Concern for possible new metastases versus pathologic fractures will obtain imaging for further evaluation.  Work-up-CT C-spine and thoracic spine are both negative for acute findings.  Rule out- I have low suspicion for spinal fracture or spinal cord abnormality as patient denies urinary  incontinency, retention, difficulty with bowel movements, denies saddle paresthesias.  Spine was palpated there is no step-off, crepitus or gross deformities felt, patient had  full range of motion, neurovascular fully intact.  Imaging negative for fractures or dislocation.  I have low suspicion for new lytic lesion from cancer as imaging is negative for acute findings.  Plan-I suspect patient suffering from a muscular strain will recommend over-the-counter pain medications, muscle relaxers,  steroids, follow-up with oncology for further evaluation.  Vital signs have remained stable, no indication for hospital admission.  Patient given at home care as well strict return precautions.  Patient verbalized that they understood agreed to said plan.   Final Clinical Impression(s) / ED Diagnoses Final diagnoses:  Neck pain    Rx / DC Orders ED Discharge Orders         Ordered    predniSONE (DELTASONE) 20 MG tablet  Daily        06/07/20 1327    cyclobenzaprine (FLEXERIL) 10 MG tablet  2 times daily PRN        06/07/20 1327           Aron Baba 06/07/20 1506    Daleen Bo, MD 06/09/20 2216

## 2020-06-07 NOTE — ED Notes (Signed)
PA is going to perform xray prior to discharge

## 2020-06-07 NOTE — ED Triage Notes (Signed)
Per pt, states neck pain radiating to lower back-no injury or trauma-states he just woke up with it

## 2020-06-07 NOTE — Discharge Instructions (Signed)
You have been seen here for neck pain, I given you a prescription for steroids and muscle relaxers please take as prescribed.  Please beware muscle relaxers can make you drowsy do not consume alcohol or operate heavy machinery while taking this medication.  Recommend take this at nighttime.  I recommend taking over-the-counter pain medications like ibuprofen and/or Tylenol every 6 as needed.  Please follow dosage and on the back of bottle.  I also recommend applying heat to the area and stretching out the muscles as this will help decrease stiffness and pain.  I have given you information on exercises please follow.  Recommend following up with Dr. Earlie Server for further evaluation.  Come back to the emergency department if you develop chest pain, shortness of breath, severe abdominal pain, uncontrolled nausea, vomiting, diarrhea.

## 2020-07-02 IMAGING — CT CT CHEST W/ CM
2 of 5 series · 12 of 36 positions shown, 15 images · IV contrast (omnipaque)
Comparison: Most recent CT chest, abdomen and pelvis 07/17/2019.
04/18/2019 PET-CT.

CLINICAL DATA: Primary Cancer Type: Lymphoma
TECHNIQUE: Multidetector CT imaging of the chest, abdomen and pelvis was
performed following the standard protocol during bolus
administration of intravenous contrast.

CONTRAST:  100mL OMNIPAQUE IOHEXOL 300 MG/ML SOLN, additional oral
enteric contrast

[Series 2: cap with · axial · 0.68mm/px · z∈[-548,-78]mm · 9 of 118 slices shown, 12 images]
[im 12/118  mediastinal]
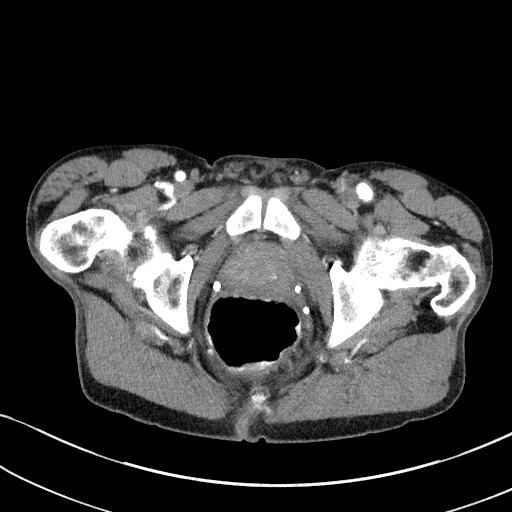
[im 12/118  lung]
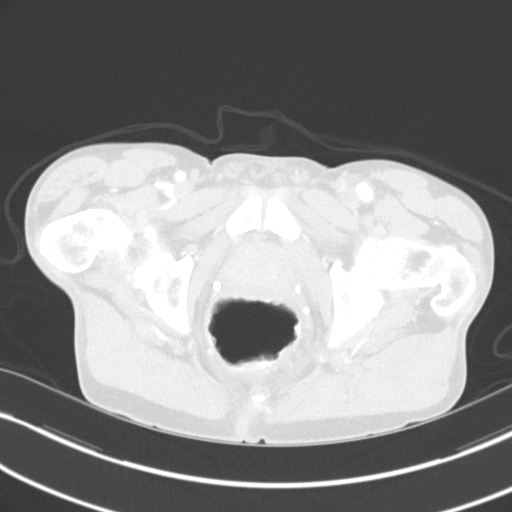
[im 24/118  lung]
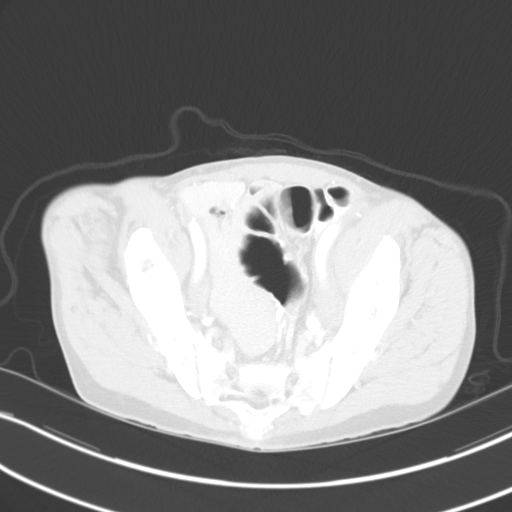
[im 36/118  lung]
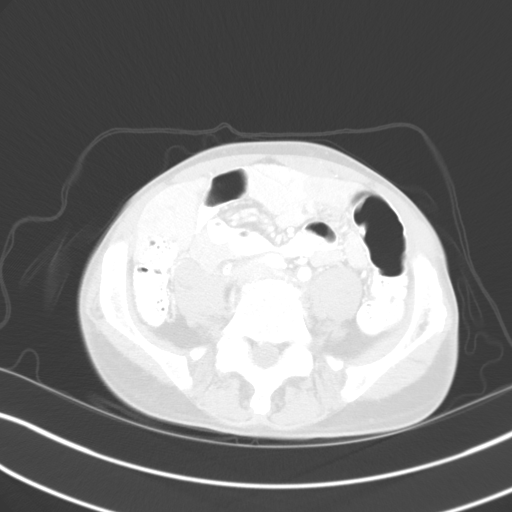
[im 47/118  lung]
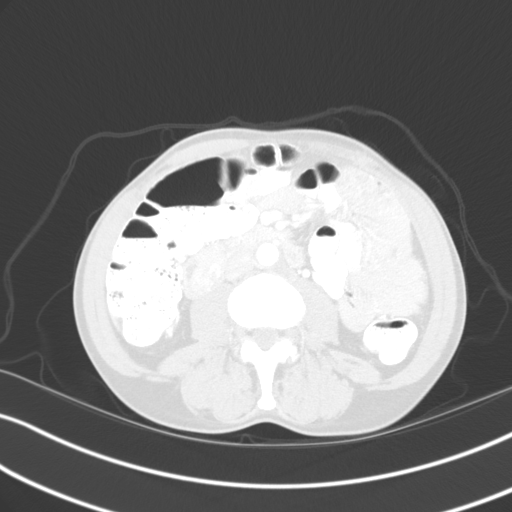
[im 59/118  mediastinal]
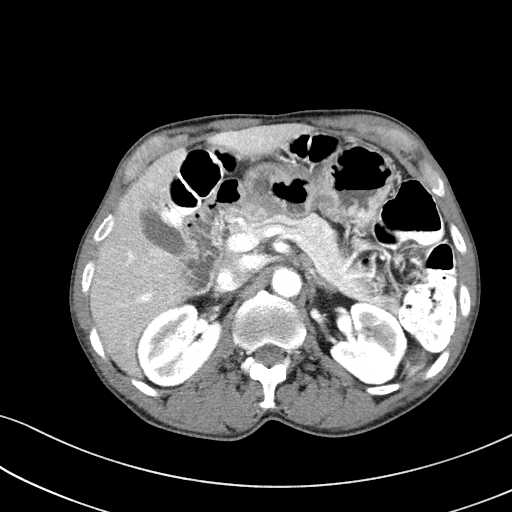
[im 59/118  lung]
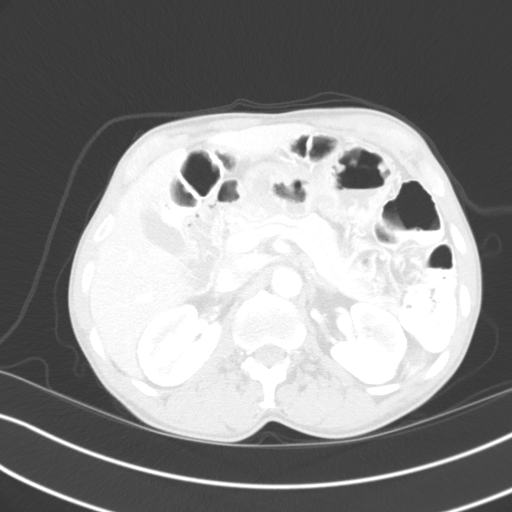
[im 71/118  lung]
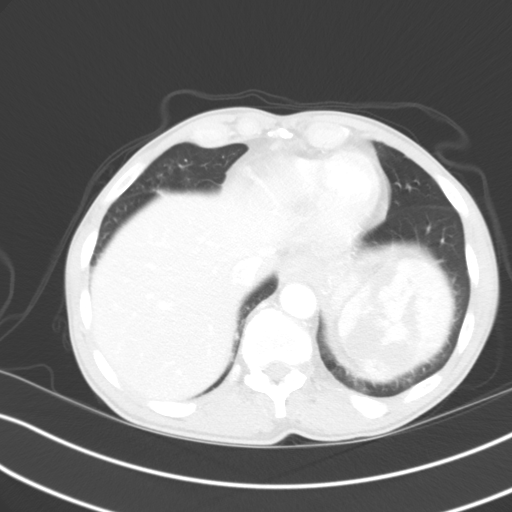
[im 82/118  lung]
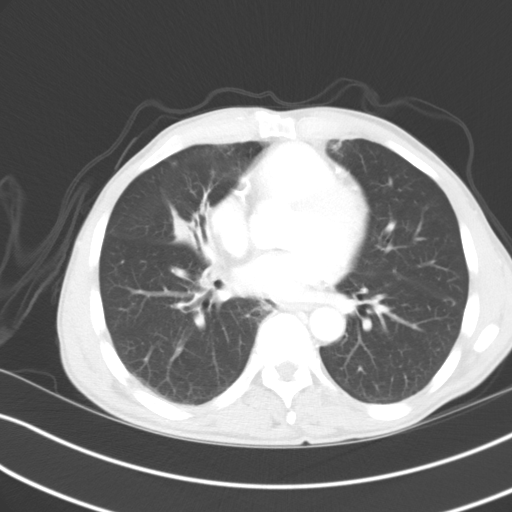
[im 94/118  lung]
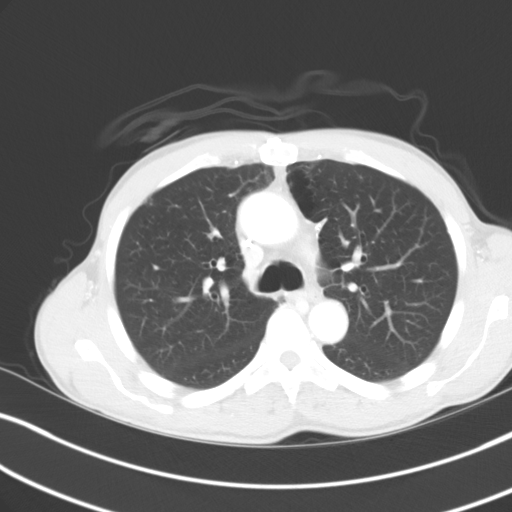
[im 106/118  mediastinal]
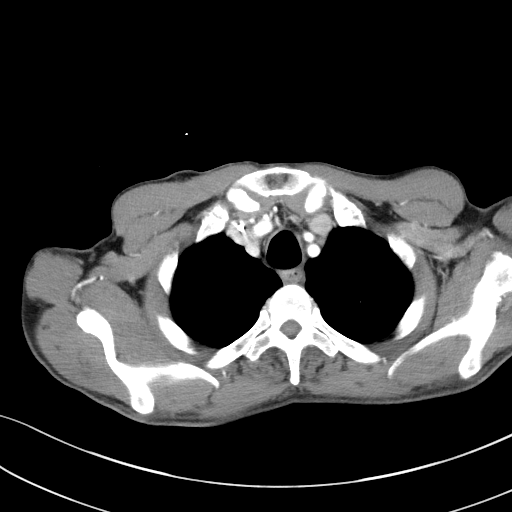
[im 106/118  lung]
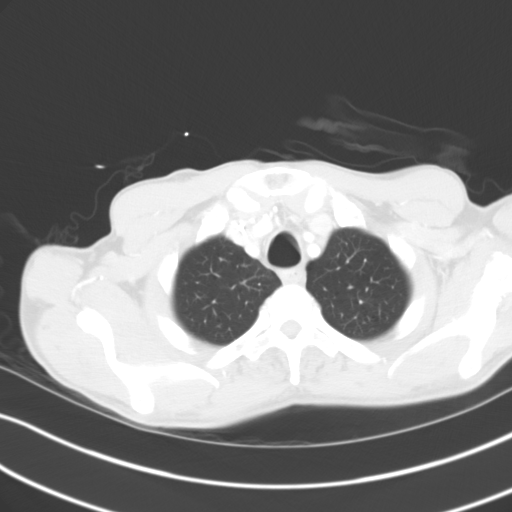

[Series 4: coronals · coronal · 0.70mm/px · 3 of 137 slices shown]
[im 28/137  lung]
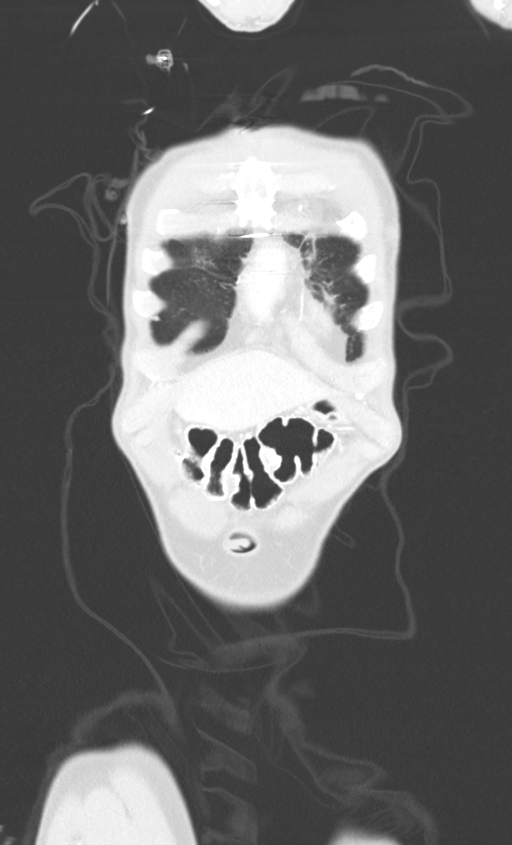
[im 55/137  lung]
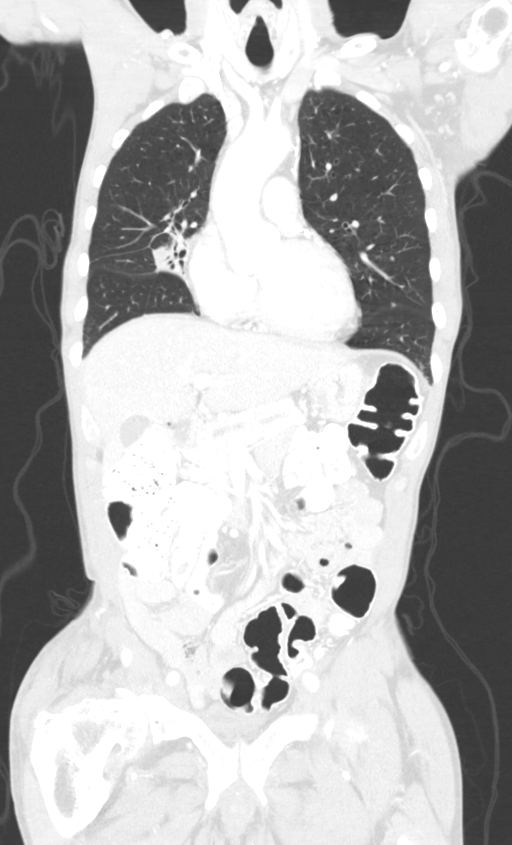
[im 82/137  lung]
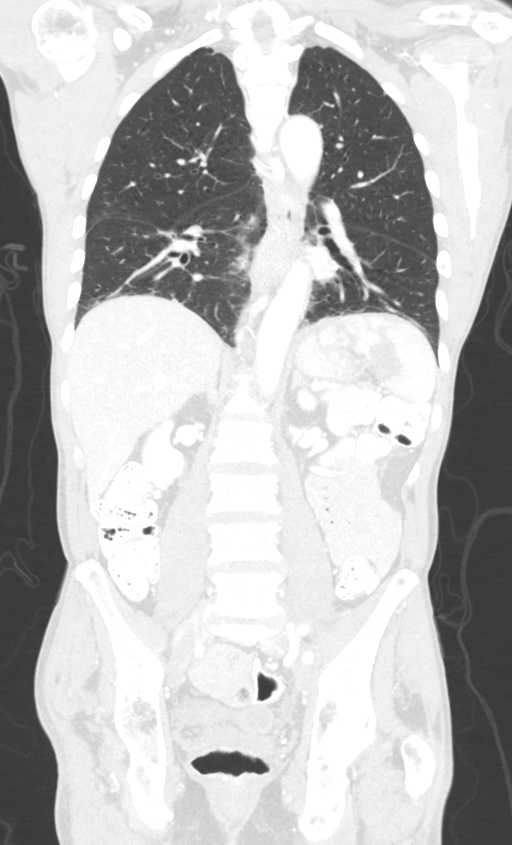

[12 of 36 positions shown; findings below may reference images not displayed]

Imaging Indication: Assess response to therapy

Interval therapy since last imaging? Yes

Initial Cancer Diagnosis

Date: 05/08/2019; Established by: Biopsy-proven

Detailed Pathology: Stage IV diffuse large B-cell non-Hodgkin
lymphoma.

Primary Tumor location: Right hilar lung mass; left supraclavicular
adenopathy.

Chemotherapy: Yes, R-CHOP; Ongoing? Yes; Most recent administration:
09/02/2019

Immunotherapy?  Yes; Type: Neulasta; Ongoing? Yes

EXAM:
CT CHEST, ABDOMEN, AND PELVIS WITH CONTRAST
FINDINGS: CT CHEST FINDINGS

Cardiovascular: Right chest port catheter. Normal heart size.
Three-vessel coronary artery calcifications and/or stents. No
pericardial effusion.

Mediastinum/Nodes: No enlarged mediastinal, hilar, or axillary lymph
nodes. Thyroid gland, trachea, and esophagus demonstrate no
significant findings.

Lungs/Pleura: Unchanged bandlike scarring and fibrosis of the
perihilar right lung, predominantly involving the central right
middle lobe (series 6, image 90). Multiple small bilateral pulmonary
nodules are unchanged, the largest abutting the right hemidiaphragm,
measuring 1.1 cm (series 6, image 104) and an additional index
nodule in the left upper lobe measuring 6 mm (series 6, image 78).
No pleural effusion or pneumothorax.

Musculoskeletal: No chest wall mass or suspicious bone lesions
identified. Surgical clips in the lower left neck.

CT ABDOMEN PELVIS FINDINGS

Hepatobiliary: No solid liver abnormality is seen. Hepatic
steatosis. No gallstones, gallbladder wall thickening, or biliary
dilatation.

Pancreas: Unremarkable. No pancreatic ductal dilatation or
surrounding inflammatory changes.

Spleen: Decreased bulk of the spleen, which remains very
heterogeneous in appearance, now measuring approximately 9.0 cm in
greatest span, previously 9.7 cm.

Adrenals/Urinary Tract: Adrenal glands are unremarkable. Kidneys are
normal, without renal calculi, solid lesion, or hydronephrosis.
Incidental small diverticulum of the posterior left bladder.

Stomach/Bowel: Stomach is within normal limits. Appendix appears
normal. No evidence of bowel wall thickening, distention, or
inflammatory changes.

Vascular/Lymphatic: Aortic atherosclerosis. No enlarged abdominal or
pelvic lymph nodes. There is residual retroperitoneal soft tissue
which is slightly decreased in thickness (series 2, image 70).

Reproductive: No mass or other abnormality.

Other: No abdominal wall hernia or abnormality. No abdominopelvic
ascites.

Musculoskeletal: No acute or significant osseous findings.
IMPRESSION: 1. Unchanged post treatment bandlike scarring and fibrosis of the
perihilar right lung, predominantly involving the central right
middle lobe. No residual mass or soft tissue.
2. Decreased bulk of the spleen, which remains very heterogeneous in
appearance, consistent with continued treatment response.
3. There is residual retroperitoneal soft tissue which is slightly
decreased in thickness, consistent with continued treatment
response.
4. No residual or recurrent lymphadenopathy in the chest, abdomen,
or pelvis.
5. Multiple small bilateral pulmonary nodules are unchanged and
remain nonspecific. Continued attention on follow-up.
6. Hepatic steatosis.
7. Coronary artery disease.  Aortic Atherosclerosis ([id number]).

## 2020-07-02 IMAGING — CT CT ABD-PELV W/ CM
2 of 5 series · 12 of 36 positions shown, 15 images · IV contrast (OMNIPAQUE)
Comparison: Most recent CT chest, abdomen and pelvis 07/17/2019.
04/18/2019 PET-CT.

CLINICAL DATA: Primary Cancer Type: Lymphoma
TECHNIQUE: Multidetector CT imaging of the chest, abdomen and pelvis was
performed following the standard protocol during bolus
administration of intravenous contrast.

CONTRAST:  100mL OMNIPAQUE IOHEXOL 300 MG/ML SOLN, additional oral
enteric contrast

[Series 2: cap with · axial · 0.68mm/px · z∈[-548,-78]mm · 9 of 118 slices shown, 12 images]
[im 12/118  mediastinal]
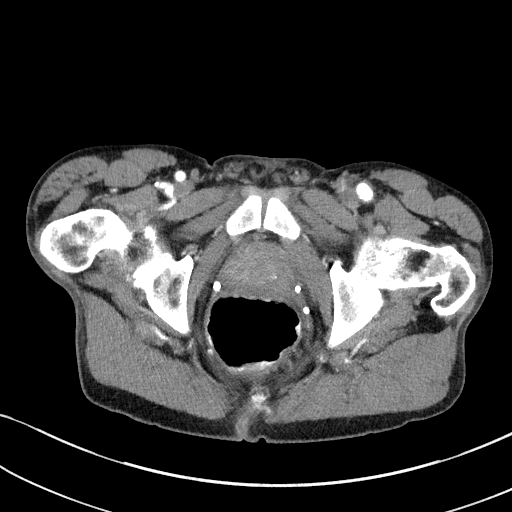
[im 12/118  lung]
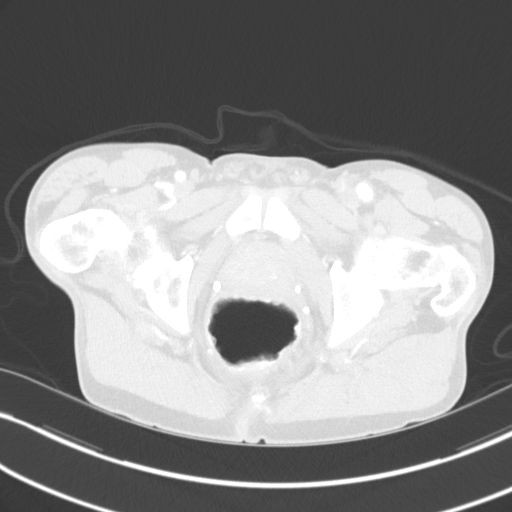
[im 24/118  lung]
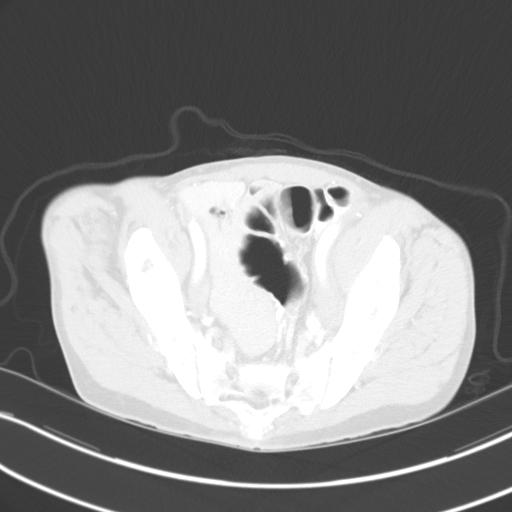
[im 36/118  lung]
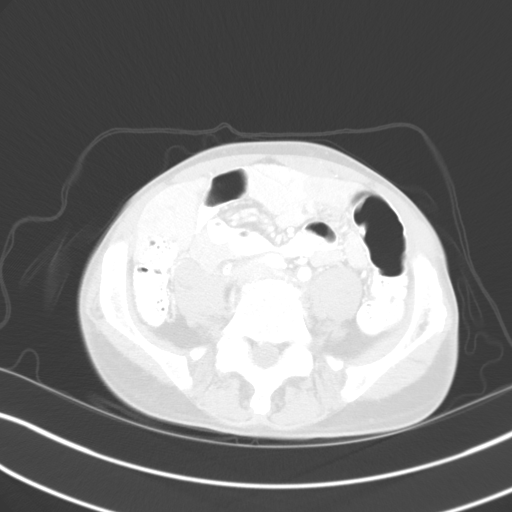
[im 47/118  lung]
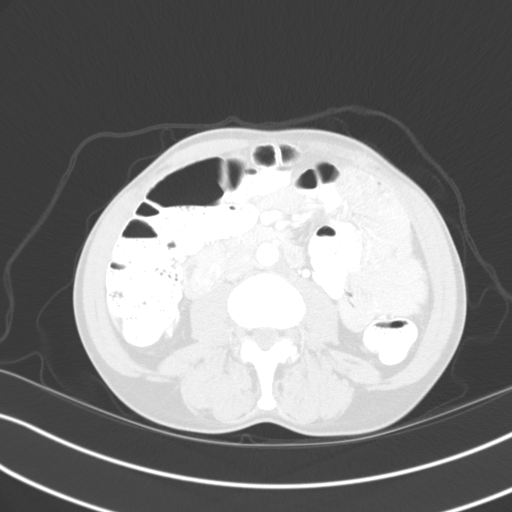
[im 59/118  mediastinal]
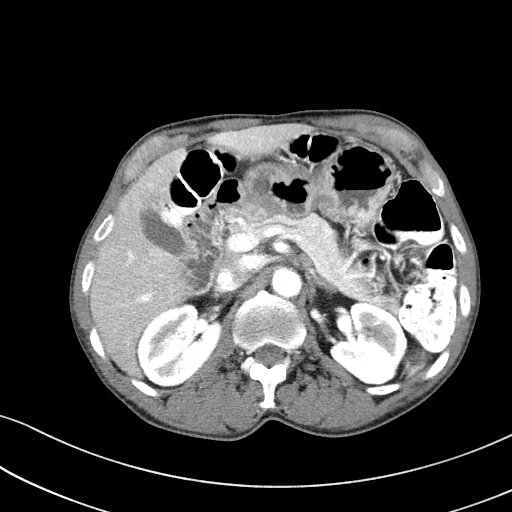
[im 59/118  lung]
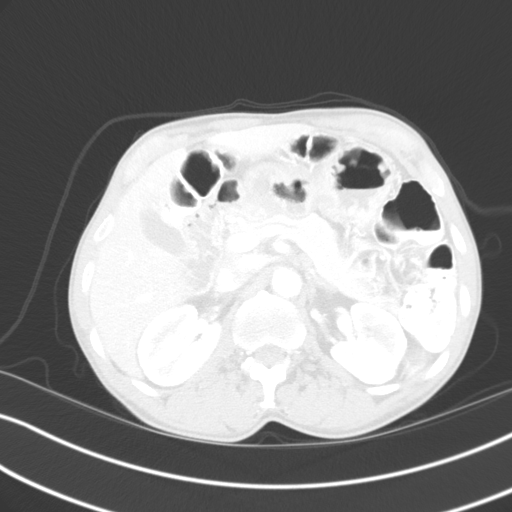
[im 71/118  lung]
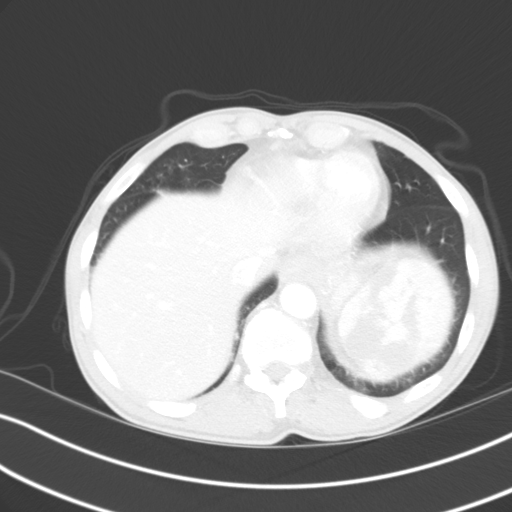
[im 82/118  lung]
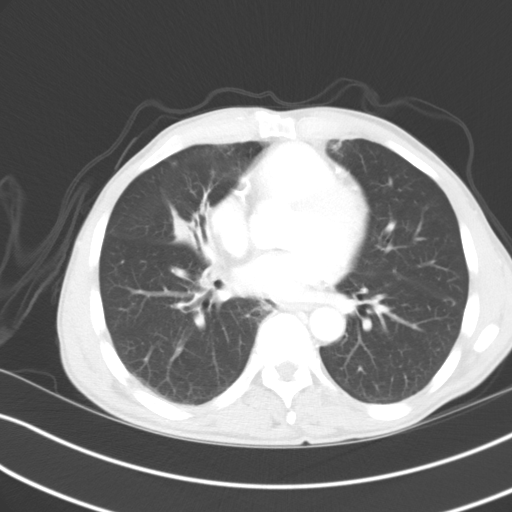
[im 94/118  lung]
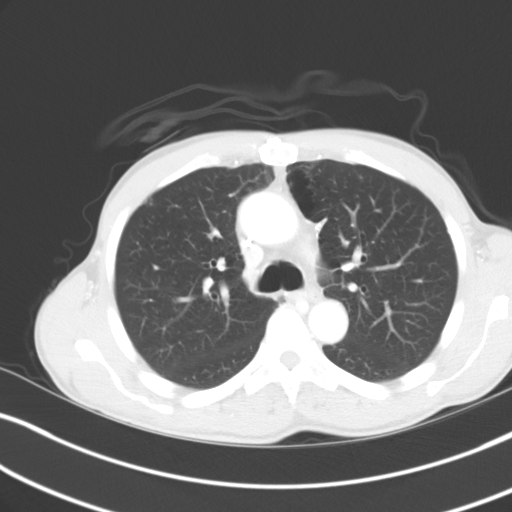
[im 106/118  mediastinal]
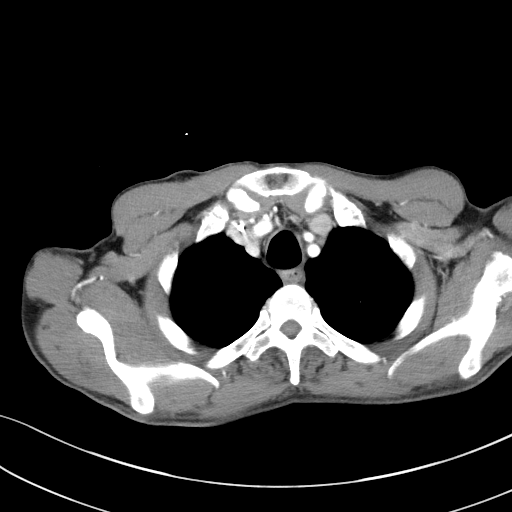
[im 106/118  lung]
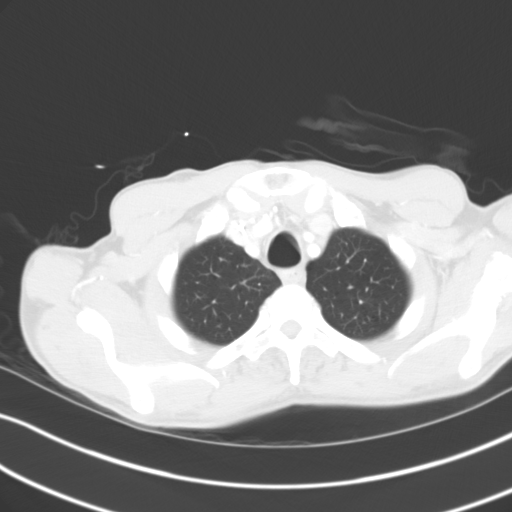

[Series 4: coronals · coronal · 0.70mm/px · 3 of 137 slices shown]
[im 28/137  lung]
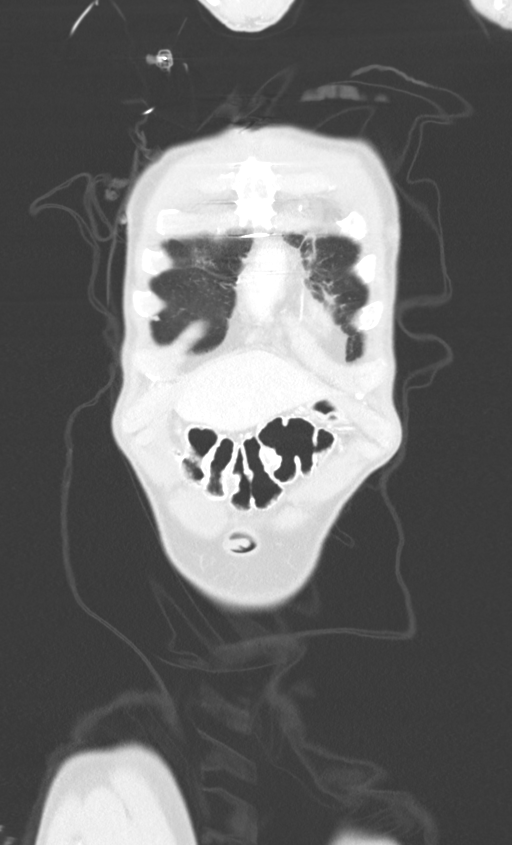
[im 55/137  lung]
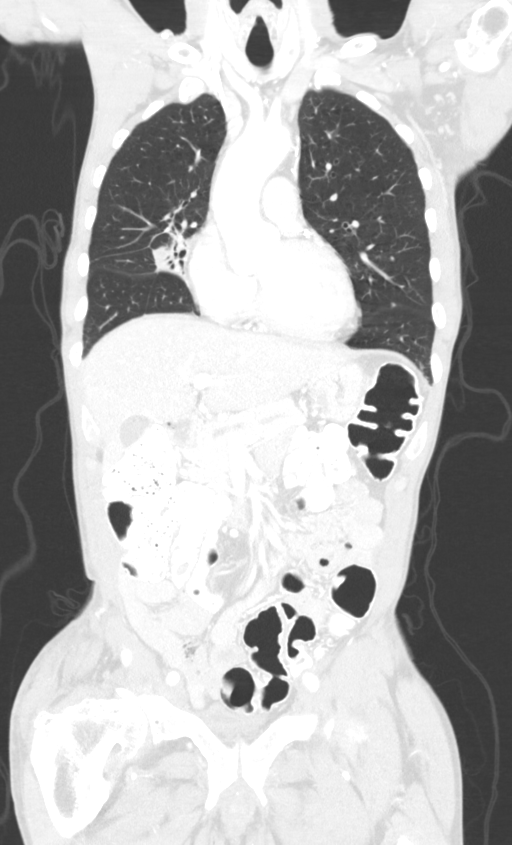
[im 82/137  lung]
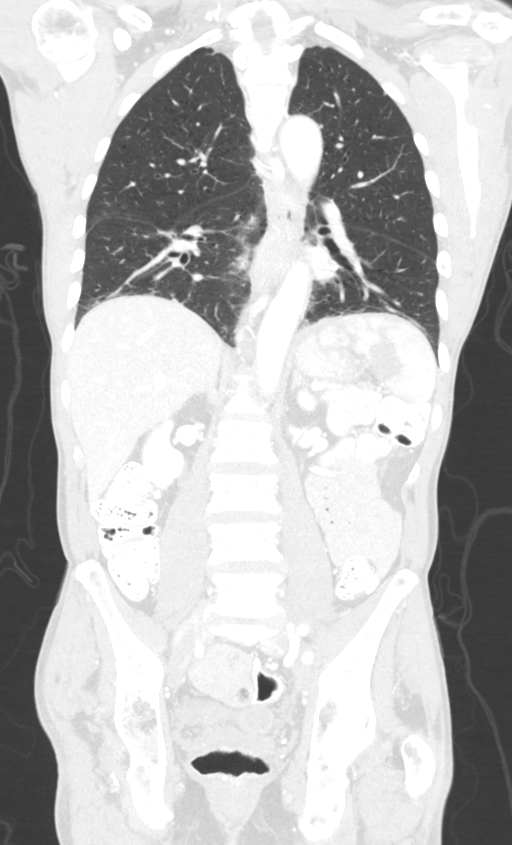

[12 of 36 positions shown; findings below may reference images not displayed]

Imaging Indication: Assess response to therapy

Interval therapy since last imaging? Yes

Initial Cancer Diagnosis

Date: 05/08/2019; Established by: Biopsy-proven

Detailed Pathology: Stage IV diffuse large B-cell non-Hodgkin
lymphoma.

Primary Tumor location: Right hilar lung mass; left supraclavicular
adenopathy.

Chemotherapy: Yes, R-CHOP; Ongoing? Yes; Most recent administration:
09/02/2019

Immunotherapy?  Yes; Type: Neulasta; Ongoing? Yes

EXAM:
CT CHEST, ABDOMEN, AND PELVIS WITH CONTRAST
FINDINGS: CT CHEST FINDINGS

Cardiovascular: Right chest port catheter. Normal heart size.
Three-vessel coronary artery calcifications and/or stents. No
pericardial effusion.

Mediastinum/Nodes: No enlarged mediastinal, hilar, or axillary lymph
nodes. Thyroid gland, trachea, and esophagus demonstrate no
significant findings.

Lungs/Pleura: Unchanged bandlike scarring and fibrosis of the
perihilar right lung, predominantly involving the central right
middle lobe (series 6, image 90). Multiple small bilateral pulmonary
nodules are unchanged, the largest abutting the right hemidiaphragm,
measuring 1.1 cm (series 6, image 104) and an additional index
nodule in the left upper lobe measuring 6 mm (series 6, image 78).
No pleural effusion or pneumothorax.

Musculoskeletal: No chest wall mass or suspicious bone lesions
identified. Surgical clips in the lower left neck.

CT ABDOMEN PELVIS FINDINGS

Hepatobiliary: No solid liver abnormality is seen. Hepatic
steatosis. No gallstones, gallbladder wall thickening, or biliary
dilatation.

Pancreas: Unremarkable. No pancreatic ductal dilatation or
surrounding inflammatory changes.

Spleen: Decreased bulk of the spleen, which remains very
heterogeneous in appearance, now measuring approximately 9.0 cm in
greatest span, previously 9.7 cm.

Adrenals/Urinary Tract: Adrenal glands are unremarkable. Kidneys are
normal, without renal calculi, solid lesion, or hydronephrosis.
Incidental small diverticulum of the posterior left bladder.

Stomach/Bowel: Stomach is within normal limits. Appendix appears
normal. No evidence of bowel wall thickening, distention, or
inflammatory changes.

Vascular/Lymphatic: Aortic atherosclerosis. No enlarged abdominal or
pelvic lymph nodes. There is residual retroperitoneal soft tissue
which is slightly decreased in thickness (series 2, image 70).

Reproductive: No mass or other abnormality.

Other: No abdominal wall hernia or abnormality. No abdominopelvic
ascites.

Musculoskeletal: No acute or significant osseous findings.
IMPRESSION: 1. Unchanged post treatment bandlike scarring and fibrosis of the
perihilar right lung, predominantly involving the central right
middle lobe. No residual mass or soft tissue.
2. Decreased bulk of the spleen, which remains very heterogeneous in
appearance, consistent with continued treatment response.
3. There is residual retroperitoneal soft tissue which is slightly
decreased in thickness, consistent with continued treatment
response.
4. No residual or recurrent lymphadenopathy in the chest, abdomen,
or pelvis.
5. Multiple small bilateral pulmonary nodules are unchanged and
remain nonspecific. Continued attention on follow-up.
6. Hepatic steatosis.
7. Coronary artery disease.  Aortic Atherosclerosis ([id number]).

## 2020-07-05 ENCOUNTER — Other Ambulatory Visit: Payer: Medicaid Other | Admitting: *Deleted

## 2020-07-05 ENCOUNTER — Encounter (INDEPENDENT_AMBULATORY_CARE_PROVIDER_SITE_OTHER): Payer: Self-pay

## 2020-07-05 ENCOUNTER — Other Ambulatory Visit: Payer: Self-pay

## 2020-07-05 DIAGNOSIS — E7800 Pure hypercholesterolemia, unspecified: Secondary | ICD-10-CM

## 2020-07-05 DIAGNOSIS — Z79899 Other long term (current) drug therapy: Secondary | ICD-10-CM

## 2020-07-05 LAB — LIPID PANEL
Chol/HDL Ratio: 4 ratio (ref 0.0–5.0)
Cholesterol, Total: 250 mg/dL — ABNORMAL HIGH (ref 100–199)
HDL: 62 mg/dL (ref >39)
LDL Chol Calc (NIH): 172 mg/dL — ABNORMAL HIGH (ref 0–99)
Triglycerides: 93 mg/dL (ref 0–149)
VLDL Cholesterol Cal: 16 mg/dL (ref 5–40)

## 2020-07-05 LAB — ALT: ALT: 27 IU/L (ref 0–44)

## 2020-07-07 ENCOUNTER — Telehealth: Payer: Self-pay | Admitting: *Deleted

## 2020-07-07 DIAGNOSIS — Z79899 Other long term (current) drug therapy: Secondary | ICD-10-CM

## 2020-07-07 DIAGNOSIS — E7800 Pure hypercholesterolemia, unspecified: Secondary | ICD-10-CM

## 2020-07-07 NOTE — Telephone Encounter (Signed)
Liver function normal 27  LDL highly elevated at 172.   Recommend starting Crestor 10 mg a day. If he has been on Crestor, increase it to 20 mg a day. Recheck lipid panel and ALT in 3 months.  Candee Furbish, MD   Called and spoke with patient regarding his lab work.  He reports he has been out of his Crestor and has not been taking it because he didn't know if he was supposed to or not.  Advised pt he is supposed to be taking it.  Advised per Dr Marlou Porch if he has been taking 10 mg he would like for him to increase to 20 mg a day.  He will also need to repeat in 3 months.  Pt requests I call and speak with his niece [place].  Attempted to call - lm for her to c/b to discuss.

## 2020-07-08 MED ORDER — ROSUVASTATIN CALCIUM 20 MG PO TABS: 20.0000 mg | ORAL_TABLET | Freq: Every day | ORAL | 3 refills | Status: DC

## 2020-07-08 NOTE — Telephone Encounter (Signed)
Pt wife Verdene Lennert is returning your call for Lab results from yesterday, she ask that you call back on her cell phone.

## 2020-07-08 NOTE — Telephone Encounter (Signed)
Spoke with niece Verdene Lennert and advised pt is to increase Crestor to 20 mg daily and repeat lab in 3 months.  She states understanding.  Lab scheduled for 10/07/2020.  She is also aware he should be taking ASA 81 mg.

## 2020-07-16 ENCOUNTER — Telehealth: Payer: Self-pay

## 2020-07-16 DIAGNOSIS — Z95828 Presence of other vascular implants and grafts: Secondary | ICD-10-CM

## 2020-07-16 MED ORDER — LIDOCAINE-PRILOCAINE 2.5-2.5 % EX CREA: 1.0000 "application " | TOPICAL_CREAM | CUTANEOUS | 1 refills | Status: DC | PRN

## 2020-07-16 NOTE — Telephone Encounter (Signed)
Pt niece called requesting refill of EMLA cream.

## 2020-07-26 ENCOUNTER — Inpatient Hospital Stay: Payer: Medicaid Other | Attending: Internal Medicine

## 2020-07-26 ENCOUNTER — Other Ambulatory Visit: Payer: Self-pay

## 2020-07-26 DIAGNOSIS — L299 Pruritus, unspecified: Secondary | ICD-10-CM | POA: Diagnosis present

## 2020-07-26 DIAGNOSIS — Z8572 Personal history of non-Hodgkin lymphomas: Secondary | ICD-10-CM | POA: Insufficient documentation

## 2020-07-26 DIAGNOSIS — C8338 Diffuse large B-cell lymphoma, lymph nodes of multiple sites: Secondary | ICD-10-CM

## 2020-07-26 DIAGNOSIS — Z9221 Personal history of antineoplastic chemotherapy: Secondary | ICD-10-CM | POA: Insufficient documentation

## 2020-07-26 DIAGNOSIS — Z95828 Presence of other vascular implants and grafts: Secondary | ICD-10-CM

## 2020-07-26 MED ORDER — SODIUM CHLORIDE 0.9% FLUSH
10.0000 mL | Freq: Once | INTRAVENOUS | Status: AC
Start: 2020-07-26 — End: 2020-07-26
  Administered 2020-07-26: 10 mL
  Filled 2020-07-26: qty 10

## 2020-07-26 MED ORDER — HEPARIN SOD (PORK) LOCK FLUSH 100 UNIT/ML IV SOLN
500.0000 [IU] | Freq: Once | INTRAVENOUS | Status: AC
  Administered 2020-07-26: 500 [IU]
  Filled 2020-07-26: qty 5

## 2020-07-26 NOTE — Patient Instructions (Signed)

## 2020-08-04 ENCOUNTER — Telehealth: Payer: Self-pay | Admitting: Medical Oncology

## 2020-08-04 ENCOUNTER — Other Ambulatory Visit: Payer: Self-pay

## 2020-08-04 ENCOUNTER — Inpatient Hospital Stay (HOSPITAL_BASED_OUTPATIENT_CLINIC_OR_DEPARTMENT_OTHER): Payer: Medicaid Other | Admitting: Medical

## 2020-08-04 ENCOUNTER — Telehealth: Payer: Self-pay | Admitting: Medical

## 2020-08-04 VITALS — BP 104/68 | HR 73 | Temp 97.1°F | Resp 18 | Wt 122.0 lb

## 2020-08-04 DIAGNOSIS — L299 Pruritus, unspecified: Secondary | ICD-10-CM

## 2020-08-04 MED ORDER — TRIAMCINOLONE ACETONIDE 0.1 % EX CREA: 1.0000 "application " | TOPICAL_CREAM | Freq: Two times a day (BID) | CUTANEOUS | 0 refills | Status: DC

## 2020-08-04 NOTE — Telephone Encounter (Signed)
Scheduled appt per 5/11 sch msg. Spoke to pt's niece Verdene Lennert, who is aware of appt.

## 2020-08-04 NOTE — Telephone Encounter (Signed)
Pt reported to his sister that his "port is bothering me , It itches" . Appt with [facility] today. Schedule message sent.

## 2020-08-04 NOTE — Progress Notes (Signed)
[facility]pt[facility] Management Clinic Progress Note   [name] [id number] [birth year] 65 y.o.  [name] is managed by Dr. Fanny Bien. [name]  Actively treated with chemotherapy/immunotherapy/hormonal therapy: no  Current therapy: Observation.  Next scheduled appointment with provider: 11/30/2020  Assessment: Plan:    Itching - Plan: triamcinolone cream (KENALOG) 0.1 %   Itching at right chest wall Port-A-Cath site: [name] was given a prescription for 0.1% triamcinolone cream with instructions to use twice daily for itching for no more than 2 weeks.  Stage IV large B cell non-Hodgkin lymphoma: [name] is status post 8 cycles of R-CHOP.  He is currently being managed with observation.  He is scheduled to be seen in follow-up by Dr. Fanny Bien. [name] on 11/30/2020.  Please see After Visit Summary for patient specific instructions.  Future Appointments  Date Time Provider [place]  09/28/2020  9:00 AM [facility] [place] [facility] None  10/07/2020  8:15 AM [facility]  11/30/2020  9:30 AM [facility] [place] [facility] None  11/30/2020 10:00 AM [name], MD [facility] None    No orders of the defined types were placed in this encounter.      Subjective:   Patient ID:  [name] is a 65 y.o. (DOB Dec 16, 1955) male.  Chief Complaint: No chief complaint on file.   HPI [name]  is a 65 y.o. male with a diagnosis of a stage IV large B cell non-Hodgkin lymphoma.  He is managed by Dr. Fanny Bien. [name] and is status post 8 cycles of R-CHOP.  He is currently being managed with observation.  He is scheduled to be seen in follow-up by Dr. Fanny Bien. [name] on 11/30/2020.  He presents to the clinic today with report of itching around his right chest wall Port-A-Cath for the past 2 weeks.  He reports that he has had a itching around the area after his port was originally placed.  This had since resolved.  He has had no  changes in activity or trauma.  He denies any other issues of concern.   Medications: I have reviewed the patient's current medications.  Allergies: No Known Allergies  Past Medical History:  Diagnosis Date  . CAD (coronary artery disease)    a. 04/2008 Cath: LM nl, LAD 50p, 22m, LCX min irregs, RCA 50p/m, 40d, RPDA 60-70ost; b. 04/2008 CABG x 4: LIMA->LAD, VG->D2, VG->RPDA->RPL.  . Cancer ([place])   . Cocaine abuse ([place])    a. Quit early 2019.  . Ischemic cardiomyopathy    a. LV gram: EF 30-35%, apical/periapical AK.  . Stroke ([place]) 02/2019  . Stroke ([place]) 02/2019  . Tobacco abuse     Past Surgical History:  Procedure Laterality Date  . BYPASS GRAFT  2010  . CARDIAC SURGERY  2010  . IR IMAGING GUIDED PORT INSERTION  05/26/2019  . SUPRACLAVICAL NODE BIOPSY Left 05/08/2019   Procedure: SUPRACLAVICAL NODE BIOPSY;  Surgeon: [name], MD;  Location: [facility];  Service: Thoracic;  Laterality: Left;    Family History  Family history unknown: Yes    Social History   Socioeconomic History  . Marital status: Single    Spouse name: Not on file  . Number of children: Not on file  . Years of education: Not on file  . Highest education level: Not on file  Occupational History  . Not on file  Tobacco Use  . Smoking status: Current Some Day Smoker  Packs/day: 0.30    Types: Cigarettes  . Smokeless tobacco: Current User  . Tobacco comment: has smoked for 40+ yrs - at most 1ppd, currently 1ppwk.  Vaping Use  . Vaping Use: Never used  Substance and Sexual Activity  . Alcohol use: Not Currently    Comment: quite > 30 yrs ago.  . Drug use: Not Currently    Types: Cocaine    Comment: prev used cocaine - quit early 2019.  Marland Kitchen Sexual activity: Not on file  Other Topics Concern  . Not on file  Social History Narrative   Lives in [place] by himself.  Currently staying in a half-way house designed to help those addicted to drugs get clean, and find housing/employment.  He exercises  some - walks/push ups.   Social Determinants of Health   Financial Resource Strain: Not on file  Food Insecurity: Not on file  Transportation Needs: Not on file  Physical Activity: Not on file  Stress: Not on file  Social Connections: Not on file  Intimate Partner Violence: Not on file    Past Medical History, Surgical history, Social history, and Family history were reviewed and updated as appropriate.   Please see review of systems for further details on the patient's review from today.   Review of Systems:  Review of Systems  Constitutional: Negative for chills, diaphoresis and fever.  HENT: Negative for facial swelling and trouble swallowing.   Respiratory: Negative for cough, chest tightness and shortness of breath.   Cardiovascular: Negative for chest pain.  Skin: Negative for rash and wound.       Itching at the site of a right chest wall Port-A-Cath.    Objective:   Physical Exam:  BP 104/68 (BP Location: Left Arm, Patient Position: Sitting)   Pulse 73   Temp (!) 97.1 F (36.2 C) (Tympanic)   Resp 18   Wt 122 lb (55.3 kg)   SpO2 98%   BMI 19.11 kg/m  ECOG: 0  Physical Exam Constitutional:      General: He is not in acute distress.    Appearance: Normal appearance. He is not ill-appearing, toxic-appearing or diaphoretic.  HENT:     Head: Normocephalic and atraumatic.  Eyes:     General: No scleral icterus.       Right eye: No discharge.        Left eye: No discharge.     Conjunctiva/sclera: Conjunctivae normal.  Skin:    General: Skin is warm and dry.     Coloration: Skin is not jaundiced or pale.     Findings: No bruising, erythema, lesion or rash.     Comments: There is a right chest wall Port-A-Cath noted.  There are areas of excoriation surrounding the Port-A-Cath.  There is no exudate, erythema, or increased warmth.  The area is well-healed and is without any breakdown tissue.  Neurological:     Mental Status: He is alert.     Coordination:  Coordination normal.     Gait: Gait normal.  Psychiatric:        Mood and Affect: Mood normal.        Behavior: Behavior normal.        Thought Content: Thought content normal.        Judgment: Judgment normal.     Lab Review:     Component Value Date/Time   NA 144 05/17/2020 0954   K 4.0 05/17/2020 0954   CL 109 05/17/2020 0954   CO2 24 05/17/2020  0954   GLUCOSE 92 05/17/2020 0954   BUN 16 05/17/2020 0954   CREATININE 0.98 05/17/2020 0954   CALCIUM 9.2 05/17/2020 0954   PROT 7.3 05/17/2020 0954   ALBUMIN 4.1 05/17/2020 0954   AST 25 05/17/2020 0954   ALT 27 07/05/2020 0743   ALT 38 05/17/2020 0954   ALKPHOS 77 05/17/2020 0954   BILITOT 0.7 05/17/2020 0954   GFRNONAA >60 05/17/2020 0954   GFRAA >60 11/04/2019 1001       Component Value Date/Time   WBC 3.2 (L) 05/17/2020 0954   WBC 8.9 05/08/2019 0550   RBC 4.70 05/17/2020 0954   HGB 15.2 05/17/2020 0954   HGB 13.4 01/01/2008 1334   HCT 45.1 05/17/2020 0954   HCT 38.7 01/01/2008 1334   PLT 172 05/17/2020 0954   PLT 200 01/01/2008 1334   MCV 96.0 05/17/2020 0954   MCV 94.5 01/01/2008 1334   MCH 32.3 05/17/2020 0954   MCHC 33.7 05/17/2020 0954   RDW 13.5 05/17/2020 0954   RDW 13.9 01/01/2008 1334   LYMPHSABS 0.8 05/17/2020 0954   LYMPHSABS 1.1 01/01/2008 1334   MONOABS 0.3 05/17/2020 0954   MONOABS 0.2 01/01/2008 1334   EOSABS 0.1 05/17/2020 0954   EOSABS 0.1 01/01/2008 1334   BASOSABS 0.0 05/17/2020 0954   BASOSABS 0.0 01/01/2008 1334   -------------------------------  Imaging from last 24 hours (if applicable):  Radiology interpretation: No results found.

## 2020-09-21 ENCOUNTER — Encounter: Payer: Self-pay | Admitting: Internal Medicine

## 2020-09-28 ENCOUNTER — Inpatient Hospital Stay: Payer: Medicaid Other | Attending: Internal Medicine

## 2020-10-07 ENCOUNTER — Other Ambulatory Visit: Payer: Medicaid Other

## 2020-11-02 ENCOUNTER — Inpatient Hospital Stay: Payer: Medicare Other

## 2020-11-02 ENCOUNTER — Other Ambulatory Visit: Payer: Self-pay

## 2020-11-02 MED ORDER — SODIUM CHLORIDE 0.9% FLUSH
10.0000 mL | Freq: Once | INTRAVENOUS | Status: AC
  Filled 2020-11-02: qty 10

## 2020-11-02 MED ORDER — HEPARIN SOD (PORK) LOCK FLUSH 100 UNIT/ML IV SOLN
500.0000 [IU] | Freq: Once | INTRAVENOUS | Status: AC
  Filled 2020-11-02: qty 5

## 2020-11-02 NOTE — Progress Notes (Unsigned)
Pt to reschedule port flush appt due to him not being aware that is what his appt was and did not have lidocaine over port. Pt refused to do it without lidocaine cause he is scared of needles.

## 2020-11-09 ENCOUNTER — Inpatient Hospital Stay: Payer: Medicare Other | Attending: Internal Medicine

## 2020-11-09 ENCOUNTER — Other Ambulatory Visit: Payer: Self-pay

## 2020-11-09 DIAGNOSIS — C8253 Diffuse follicle center lymphoma, intra-abdominal lymph nodes: Secondary | ICD-10-CM | POA: Diagnosis present

## 2020-11-09 DIAGNOSIS — C8338 Diffuse large B-cell lymphoma, lymph nodes of multiple sites: Secondary | ICD-10-CM

## 2020-11-09 DIAGNOSIS — C8258 Diffuse follicle center lymphoma, lymph nodes of multiple sites: Secondary | ICD-10-CM

## 2020-11-09 DIAGNOSIS — Z95828 Presence of other vascular implants and grafts: Secondary | ICD-10-CM

## 2020-11-09 DIAGNOSIS — R590 Localized enlarged lymph nodes: Secondary | ICD-10-CM

## 2020-11-09 LAB — CBC WITH DIFFERENTIAL (CANCER CENTER ONLY)
Abs Immature Granulocytes: 0 10*3/uL (ref 0.00–0.07)
Basophils Absolute: 0 10*3/uL (ref 0.0–0.1)
Basophils Relative: 1 %
Eosinophils Absolute: 0.1 10*3/uL (ref 0.0–0.5)
Eosinophils Relative: 3 %
HCT: 40.4 % (ref 39.0–52.0)
Hemoglobin: 13.5 g/dL (ref 13.0–17.0)
Immature Granulocytes: 0 %
Lymphocytes Relative: 41 %
Lymphs Abs: 1 10*3/uL (ref 0.7–4.0)
MCH: 32.1 pg (ref 26.0–34.0)
MCHC: 33.4 g/dL (ref 30.0–36.0)
MCV: 96.2 fL (ref 80.0–100.0)
Monocytes Absolute: 0.2 10*3/uL (ref 0.1–1.0)
Monocytes Relative: 10 %
Neutro Abs: 1.1 10*3/uL — ABNORMAL LOW (ref 1.7–7.7)
Neutrophils Relative %: 45 %
Platelet Count: 181 10*3/uL (ref 150–400)
RBC: 4.2 MIL/uL — ABNORMAL LOW (ref 4.22–5.81)
RDW: 13.2 % (ref 11.5–15.5)
WBC Count: 2.4 10*3/uL — ABNORMAL LOW (ref 4.0–10.5)
nRBC: 0 % (ref 0.0–0.2)

## 2020-11-09 LAB — CMP (CANCER CENTER ONLY)
ALT: 14 U/L (ref 0–44)
AST: 15 U/L (ref 15–41)
Albumin: 3.8 g/dL (ref 3.5–5.0)
Alkaline Phosphatase: 65 U/L (ref 38–126)
Anion gap: 5 (ref 5–15)
BUN: 13 mg/dL (ref 8–23)
CO2: 27 mmol/L (ref 22–32)
Calcium: 9 mg/dL (ref 8.9–10.3)
Chloride: 109 mmol/L (ref 98–111)
Creatinine: 0.87 mg/dL (ref 0.61–1.24)
GFR, Estimated: >60 mL/min (ref >60)
Glucose, Bld: 86 mg/dL (ref 70–99)
Potassium: 3.9 mmol/L (ref 3.5–5.1)
Sodium: 141 mmol/L (ref 135–145)
Total Bilirubin: 0.9 mg/dL (ref 0.3–1.2)
Total Protein: 6.5 g/dL (ref 6.5–8.1)

## 2020-11-09 LAB — LACTATE DEHYDROGENASE: LDH: 125 U/L (ref 98–192)

## 2020-11-09 MED ORDER — HEPARIN SOD (PORK) LOCK FLUSH 100 UNIT/ML IV SOLN
500.0000 [IU] | Freq: Once | INTRAVENOUS | Status: AC
  Administered 2020-11-09: 500 [IU]

## 2020-11-09 MED ORDER — SODIUM CHLORIDE 0.9% FLUSH
10.0000 mL | Freq: Once | INTRAVENOUS | Status: AC
  Administered 2020-11-09: 10 mL

## 2020-11-24 ENCOUNTER — Other Ambulatory Visit: Payer: Self-pay | Admitting: Nurse Practitioner

## 2020-11-24 DIAGNOSIS — Z720 Tobacco use: Secondary | ICD-10-CM

## 2020-11-26 ENCOUNTER — Other Ambulatory Visit: Payer: Self-pay

## 2020-11-26 DIAGNOSIS — C8338 Diffuse large B-cell lymphoma, lymph nodes of multiple sites: Secondary | ICD-10-CM

## 2020-11-30 ENCOUNTER — Inpatient Hospital Stay: Payer: Medicare Other

## 2020-11-30 ENCOUNTER — Inpatient Hospital Stay: Payer: Medicare Other | Attending: Internal Medicine | Admitting: Internal Medicine

## 2020-11-30 ENCOUNTER — Other Ambulatory Visit: Payer: Self-pay

## 2020-11-30 ENCOUNTER — Other Ambulatory Visit: Payer: Medicaid Other

## 2020-11-30 VITALS — BP 99/81 | HR 64 | Temp 96.5°F | Resp 18 | Wt 117.4 lb

## 2020-11-30 DIAGNOSIS — C8253 Diffuse follicle center lymphoma, intra-abdominal lymph nodes: Secondary | ICD-10-CM | POA: Diagnosis present

## 2020-11-30 LAB — CBC WITH DIFFERENTIAL (CANCER CENTER ONLY)
Abs Immature Granulocytes: 0 10*3/uL (ref 0.00–0.07)
Basophils Absolute: 0 10*3/uL (ref 0.0–0.1)
Basophils Relative: 1 %
Eosinophils Absolute: 0 10*3/uL (ref 0.0–0.5)
Eosinophils Relative: 1 %
HCT: 46 % (ref 39.0–52.0)
Hemoglobin: 15.4 g/dL (ref 13.0–17.0)
Immature Granulocytes: 0 %
Lymphocytes Relative: 35 %
Lymphs Abs: 1 10*3/uL (ref 0.7–4.0)
MCH: 32.8 pg (ref 26.0–34.0)
MCHC: 33.5 g/dL (ref 30.0–36.0)
MCV: 97.9 fL (ref 80.0–100.0)
Monocytes Absolute: 0.3 10*3/uL (ref 0.1–1.0)
Monocytes Relative: 9 %
Neutro Abs: 1.5 10*3/uL — ABNORMAL LOW (ref 1.7–7.7)
Neutrophils Relative %: 54 %
Platelet Count: 174 10*3/uL (ref 150–400)
RBC: 4.7 MIL/uL (ref 4.22–5.81)
RDW: 13.3 % (ref 11.5–15.5)
WBC Count: 2.8 10*3/uL — ABNORMAL LOW (ref 4.0–10.5)
nRBC: 0 % (ref 0.0–0.2)

## 2020-11-30 LAB — CMP (CANCER CENTER ONLY)
ALT: 18 U/L (ref 0–44)
AST: 16 U/L (ref 15–41)
Albumin: 4.1 g/dL (ref 3.5–5.0)
Alkaline Phosphatase: 71 U/L (ref 38–126)
Anion gap: 9 (ref 5–15)
BUN: 15 mg/dL (ref 8–23)
CO2: 24 mmol/L (ref 22–32)
Calcium: 9.5 mg/dL (ref 8.9–10.3)
Chloride: 109 mmol/L (ref 98–111)
Creatinine: 0.92 mg/dL (ref 0.61–1.24)
GFR, Estimated: >60 mL/min (ref >60)
Glucose, Bld: 79 mg/dL (ref 70–99)
Potassium: 4.1 mmol/L (ref 3.5–5.1)
Sodium: 142 mmol/L (ref 135–145)
Total Bilirubin: 0.7 mg/dL (ref 0.3–1.2)
Total Protein: 7.2 g/dL (ref 6.5–8.1)

## 2020-11-30 MED ORDER — HEPARIN SOD (PORK) LOCK FLUSH 100 UNIT/ML IV SOLN
500.0000 [IU] | Freq: Once | INTRAVENOUS | Status: AC
Start: 2020-11-30 — End: ?

## 2020-11-30 MED ORDER — SODIUM CHLORIDE 0.9% FLUSH: 10.0000 mL | Freq: Once | INTRAVENOUS | Status: AC

## 2020-11-30 NOTE — Progress Notes (Signed)
[facility] [phone]   [phone]  OFFICE PROGRESS NOTE  Patient, No Pcp Per (Inactive) No address on file  DIAGNOSIS: Stage IV large B cell non-Hodgkin lymphoma presented with bulky right hilar and central perihilar mass involving the right upper lobe and right middle lobe with massive enlargement of the spleen as well as lymphadenopathy as well as central necrotic lymphadenopathy in the left para-aortic, left common iliac, left external iliac and left inguinal chain diagnosed in February 2021.    PRIOR THERAPY: Systemic chemotherapy with R-CHOP every 3 weeks.  First dose May 20, 2019.  Status post 8 cycles.   CURRENT THERAPY: Observation.  INTERVAL HISTORY: [name] 65 y.o. male returns to the clinic today for follow-up visit.  The patient is feeling fine today with no concerning complaints except for feeling cold.  He denied having any current chest pain, shortness of breath, cough or hemoptysis.  He denied having any fever or chills.  He has no nausea, vomiting, diarrhea or constipation.  He has no headache or visual changes.  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  The patient is here today for evaluation and repeat blood work.   MEDICAL HISTORY: Past Medical History:  Diagnosis Date   CAD (coronary artery disease)    a. 04/2008 Cath: LM nl, LAD 50p, 22m, LCX min irregs, RCA 50p/m, 40d, RPDA 60-70ost; b. 04/2008 CABG x 4: LIMA->LAD, VG->D2, VG->RPDA->RPL.   Cancer ([place])    Cocaine abuse ([place])    a. Quit early 2019.   Ischemic cardiomyopathy    a. LV gram: EF 30-35%, apical/periapical AK.   Stroke [facility]) 02/2019   Stroke ([place]) 02/2019   Tobacco abuse     ALLERGIES:  has No Known Allergies.  MEDICATIONS:  Current Outpatient Medications  Medication Sig Dispense Refill   aspirin EC 81 MG tablet Take 1 tablet (81 mg total) by mouth daily. (Patient not taking: Reported on 05/19/2020) 30 tablet 3   cromolyn  (OPTICROM) 4 % ophthalmic solution Place 2 drops into the right eye 4 (four) times daily. 10 mL 3   cyclobenzaprine (FLEXERIL) 10 MG tablet Take 1 tablet (10 mg total) by mouth 2 (two) times daily as needed for muscle spasms. 20 tablet 0   lidocaine-prilocaine (EMLA) cream Apply 1 application topically as needed. 30 g 1   rosuvastatin (CRESTOR) 20 MG tablet Take 1 tablet (20 mg total) by mouth daily. 90 tablet 3   triamcinolone cream (KENALOG) 0.1 % Apply 1 application topically 2 (two) times daily. Use no longer than 2 weeks. Allow at least 1 week off before restarting if needed. 45 g 0   No current facility-administered medications for this visit.   Facility-Administered Medications Ordered in Other Visits  Medication Dose Route Frequency Provider Last Rate Last Admin   heparin lock flush 100 unit/mL  500 Units Intracatheter Once [name], MD       heparin lock flush 100 unit/mL  500 Units Intracatheter Once [name], MD       sodium chloride flush (NS) 0.9 % injection 10 mL  10 mL Intracatheter Once [name], MD       sodium chloride flush (NS) 0.9 % injection 10 mL  10 mL Intracatheter Once [name], MD        SURGICAL HISTORY:  Past Surgical History:  Procedure Laterality Date   BYPASS GRAFT  2010   CARDIAC SURGERY  2010  IR IMAGING GUIDED PORT INSERTION  05/26/2019   SUPRACLAVICAL NODE BIOPSY Left 05/08/2019   Procedure: SUPRACLAVICAL NODE BIOPSY;  Surgeon: [name], MD;  Location: [place];  Service: Thoracic;  Laterality: Left;    REVIEW OF SYSTEMS:  A comprehensive review of systems was negative.   PHYSICAL EXAMINATION: General appearance: alert, cooperative, and no distress Head: Normocephalic, without obvious abnormality, atraumatic Neck: no adenopathy, no JVD, supple, symmetrical, trachea midline, and thyroid not enlarged, symmetric, no tenderness/mass/nodules Lymph nodes: Cervical, supraclavicular, and axillary nodes normal. Resp:  clear to auscultation bilaterally Back: symmetric, no curvature. ROM normal. No CVA tenderness. Cardio: regular rate and rhythm, S1, S2 normal, no murmur, click, rub or gallop GI: soft, non-tender; bowel sounds normal; no masses,  no organomegaly Extremities: extremities normal, atraumatic, no cyanosis or edema  ECOG PERFORMANCE STATUS: 1 - Symptomatic but completely ambulatory  Blood pressure 99/81, pulse 64, temperature (!) 96.5 F (35.8 C), resp. rate 18, weight 117 lb 7 oz (53.3 kg), SpO2 100 %.  LABORATORY DATA: Lab Results  Component Value Date   WBC 2.8 (L) 11/30/2020   HGB 15.4 11/30/2020   HCT 46.0 11/30/2020   MCV 97.9 11/30/2020   PLT 174 11/30/2020      Chemistry      Component Value Date/Time   NA 142 11/30/2020 1023   K 4.1 11/30/2020 1023   CL 109 11/30/2020 1023   CO2 24 11/30/2020 1023   BUN 15 11/30/2020 1023   CREATININE 0.92 11/30/2020 1023      Component Value Date/Time   CALCIUM 9.0 11/09/2020 0853   ALKPHOS 65 11/09/2020 0853   AST 15 11/09/2020 0853   ALT 14 11/09/2020 0853   BILITOT 0.9 11/09/2020 0853       RADIOGRAPHIC STUDIES: No results found.   ASSESSMENT AND PLAN: This is a 65  years old African-American male recently diagnosed with 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  The patient completed course of systemic chemotherapy with R-CHOP status post 8 cycles.  He tolerated his treatment fairly well with no significant adverse effects. The patient is currently on observation. He is doing fine today with no concerning issues. Repeat CBC showed the persistent mild leukocytopenia.  Comprehensive metabolic panel is unremarkable. I recommended for the patient to continue on observation with repeat lab and scan before  next MD visit in 6 months. I will arrange for the patient to have Port-A-Cath flush every 2 months during this period. He was advised to call immediately if he has any other concerning issues in the interval. The patient voices understanding of current disease status and treatment options and is in agreement with the current care plan.  All questions were answered. The patient knows to call the clinic with any problems, questions or concerns. We can certainly see the patient much sooner if necessary.   Disclaimer: This note was dictated with voice recognition software. Similar sounding words can inadvertently be transcribed and may not be corrected upon review.

## 2020-11-30 NOTE — Progress Notes (Unsigned)
Pt did not want port accessed, he states he was not aware he had a port flush appointment and did not use the numbing cream. Pt said he was okay with getting blood drawn peripherally.

## 2020-11-30 NOTE — Progress Notes (Signed)
Call received from the lab stating pts labs need to be re-drawn. I advised the [name] of this and he stated he was leaving and was not going back to the lab. I attempted several times to try to talk to the [name] and let him know that Dr. Julien Nordmann really needs to go over his lab results with him and he began to use profane language and walked out.

## 2020-12-01 ENCOUNTER — Telehealth: Payer: Self-pay | Admitting: Internal Medicine

## 2020-12-01 NOTE — Telephone Encounter (Signed)
Scheduled appt per 9/6 los - mailed letter with appt date and time

## 2021-01-31 ENCOUNTER — Inpatient Hospital Stay: Payer: Medicare Other | Attending: Internal Medicine

## 2021-01-31 ENCOUNTER — Other Ambulatory Visit: Payer: Self-pay

## 2021-01-31 DIAGNOSIS — C8338 Diffuse large B-cell lymphoma, lymph nodes of multiple sites: Secondary | ICD-10-CM

## 2021-01-31 DIAGNOSIS — C8253 Diffuse follicle center lymphoma, intra-abdominal lymph nodes: Secondary | ICD-10-CM | POA: Insufficient documentation

## 2021-01-31 DIAGNOSIS — Z452 Encounter for adjustment and management of vascular access device: Secondary | ICD-10-CM | POA: Diagnosis present

## 2021-01-31 DIAGNOSIS — Z95828 Presence of other vascular implants and grafts: Secondary | ICD-10-CM

## 2021-01-31 MED ORDER — HEPARIN SOD (PORK) LOCK FLUSH 100 UNIT/ML IV SOLN
500.0000 [IU] | Freq: Once | INTRAVENOUS | Status: AC
  Administered 2021-01-31: 500 [IU]

## 2021-01-31 MED ORDER — SODIUM CHLORIDE 0.9% FLUSH
10.0000 mL | Freq: Once | INTRAVENOUS | Status: AC
  Administered 2021-01-31: 10 mL

## 2021-03-01 ENCOUNTER — Emergency Department (HOSPITAL_COMMUNITY): Payer: Medicare Other

## 2021-03-01 ENCOUNTER — Observation Stay (HOSPITAL_COMMUNITY)
Admission: EM | Admit: 2021-03-01 | Discharge: 2021-03-02 | Disposition: A | Discharge status: Home / Self Care | Payer: Medicare Other | Attending: Emergency Medicine | Admitting: Emergency Medicine

## 2021-03-01 ENCOUNTER — Other Ambulatory Visit: Payer: Self-pay

## 2021-03-01 ENCOUNTER — Encounter (HOSPITAL_COMMUNITY): Payer: Self-pay

## 2021-03-01 DIAGNOSIS — I69353 Hemiplegia and hemiparesis following cerebral infarction affecting right non-dominant side: Secondary | ICD-10-CM | POA: Diagnosis not present

## 2021-03-01 DIAGNOSIS — F17210 Nicotine dependence, cigarettes, uncomplicated: Secondary | ICD-10-CM | POA: Insufficient documentation

## 2021-03-01 DIAGNOSIS — Z951 Presence of aortocoronary bypass graft: Secondary | ICD-10-CM | POA: Insufficient documentation

## 2021-03-01 DIAGNOSIS — Z79899 Other long term (current) drug therapy: Secondary | ICD-10-CM | POA: Diagnosis not present

## 2021-03-01 DIAGNOSIS — R29898 Other symptoms and signs involving the musculoskeletal system: Secondary | ICD-10-CM | POA: Diagnosis present

## 2021-03-01 DIAGNOSIS — E7800 Pure hypercholesterolemia, unspecified: Secondary | ICD-10-CM | POA: Diagnosis not present

## 2021-03-01 DIAGNOSIS — I5043 Acute on chronic combined systolic (congestive) and diastolic (congestive) heart failure: Secondary | ICD-10-CM | POA: Diagnosis not present

## 2021-03-01 DIAGNOSIS — F17200 Nicotine dependence, unspecified, uncomplicated: Secondary | ICD-10-CM

## 2021-03-01 DIAGNOSIS — I5022 Chronic systolic (congestive) heart failure: Secondary | ICD-10-CM

## 2021-03-01 DIAGNOSIS — Z20822 Contact with and (suspected) exposure to covid-19: Secondary | ICD-10-CM | POA: Diagnosis not present

## 2021-03-01 DIAGNOSIS — F1410 Cocaine abuse, uncomplicated: Secondary | ICD-10-CM

## 2021-03-01 DIAGNOSIS — E785 Hyperlipidemia, unspecified: Secondary | ICD-10-CM | POA: Diagnosis present

## 2021-03-01 DIAGNOSIS — I2510 Atherosclerotic heart disease of native coronary artery without angina pectoris: Secondary | ICD-10-CM | POA: Insufficient documentation

## 2021-03-01 DIAGNOSIS — I110 Hypertensive heart disease with heart failure: Secondary | ICD-10-CM | POA: Diagnosis not present

## 2021-03-01 DIAGNOSIS — Z8673 Personal history of transient ischemic attack (TIA), and cerebral infarction without residual deficits: Secondary | ICD-10-CM | POA: Diagnosis not present

## 2021-03-01 DIAGNOSIS — R55 Syncope and collapse: Secondary | ICD-10-CM | POA: Diagnosis present

## 2021-03-01 DIAGNOSIS — Z7982 Long term (current) use of aspirin: Secondary | ICD-10-CM | POA: Diagnosis not present

## 2021-03-01 LAB — CBC WITH DIFFERENTIAL/PLATELET
Abs Immature Granulocytes: 0 10*3/uL (ref 0.00–0.07)
Basophils Absolute: 0 10*3/uL (ref 0.0–0.1)
Basophils Relative: 0 %
Eosinophils Absolute: 0 10*3/uL (ref 0.0–0.5)
Eosinophils Relative: 1 %
HCT: 41.5 % (ref 39.0–52.0)
Hemoglobin: 13.4 g/dL (ref 13.0–17.0)
Immature Granulocytes: 0 %
Lymphocytes Relative: 10 %
Lymphs Abs: 0.5 10*3/uL — ABNORMAL LOW (ref 0.7–4.0)
MCH: 32.2 pg (ref 26.0–34.0)
MCHC: 32.3 g/dL (ref 30.0–36.0)
MCV: 99.8 fL (ref 80.0–100.0)
Monocytes Absolute: 0.2 10*3/uL (ref 0.1–1.0)
Monocytes Relative: 5 %
Neutro Abs: 4.1 10*3/uL (ref 1.7–7.7)
Neutrophils Relative %: 84 %
Platelets: 168 10*3/uL (ref 150–400)
RBC: 4.16 MIL/uL — ABNORMAL LOW (ref 4.22–5.81)
RDW: 13.2 % (ref 11.5–15.5)
WBC: 4.9 10*3/uL (ref 4.0–10.5)
nRBC: 0 % (ref 0.0–0.2)

## 2021-03-01 LAB — RESP PANEL BY RT-PCR (FLU A&B, COVID) ARPGX2
Influenza A by PCR: NEGATIVE
Influenza B by PCR: NEGATIVE
SARS Coronavirus 2 by RT PCR: NEGATIVE

## 2021-03-01 LAB — COMPREHENSIVE METABOLIC PANEL
ALT: 12 U/L (ref 0–44)
AST: 17 U/L (ref 15–41)
Albumin: 3.6 g/dL (ref 3.5–5.0)
Alkaline Phosphatase: 65 U/L (ref 38–126)
Anion gap: 7 (ref 5–15)
BUN: 10 mg/dL (ref 8–23)
CO2: 26 mmol/L (ref 22–32)
Calcium: 8.8 mg/dL — ABNORMAL LOW (ref 8.9–10.3)
Chloride: 105 mmol/L (ref 98–111)
Creatinine, Ser: 0.96 mg/dL (ref 0.61–1.24)
GFR, Estimated: >60 mL/min (ref >60)
Glucose, Bld: 74 mg/dL (ref 70–99)
Potassium: 3.5 mmol/L (ref 3.5–5.1)
Sodium: 138 mmol/L (ref 135–145)
Total Bilirubin: 0.9 mg/dL (ref 0.3–1.2)
Total Protein: 6.4 g/dL — ABNORMAL LOW (ref 6.5–8.1)

## 2021-03-01 LAB — I-STAT CHEM 8, ED
BUN: 10 mg/dL (ref 8–23)
Calcium, Ion: 1.19 mmol/L (ref 1.15–1.40)
Chloride: 104 mmol/L (ref 98–111)
Creatinine, Ser: 0.9 mg/dL (ref 0.61–1.24)
Glucose, Bld: 67 mg/dL — ABNORMAL LOW (ref 70–99)
HCT: 42 % (ref 39.0–52.0)
Hemoglobin: 14.3 g/dL (ref 13.0–17.0)
Potassium: 3.5 mmol/L (ref 3.5–5.1)
Sodium: 142 mmol/L (ref 135–145)
TCO2: 29 mmol/L (ref 22–32)

## 2021-03-01 LAB — URINALYSIS, ROUTINE W REFLEX MICROSCOPIC
Bilirubin Urine: NEGATIVE
Glucose, UA: NEGATIVE mg/dL
Hgb urine dipstick: NEGATIVE
Ketones, ur: NEGATIVE mg/dL
Leukocytes,Ua: NEGATIVE
Nitrite: NEGATIVE
Protein, ur: NEGATIVE mg/dL
Specific Gravity, Urine: <1.005 — ABNORMAL LOW (ref 1.005–1.030)
pH: 7 (ref 5.0–8.0)

## 2021-03-01 LAB — PROTIME-INR
INR: 1 (ref 0.8–1.2)
Prothrombin Time: 13.2 seconds (ref 11.4–15.2)

## 2021-03-01 LAB — CBG MONITORING, ED
Glucose-Capillary: 73 mg/dL (ref 70–99)
Glucose-Capillary: 81 mg/dL (ref 70–99)

## 2021-03-01 LAB — TROPONIN I (HIGH SENSITIVITY)
Troponin I (High Sensitivity): 4 ng/L (ref <18)
Troponin I (High Sensitivity): 5 ng/L (ref <18)

## 2021-03-01 MED ORDER — DEXTROSE 50 % IV SOLN: 1.0000 | Freq: Once | INTRAVENOUS | Status: DC

## 2021-03-01 MED ORDER — POTASSIUM CHLORIDE CRYS ER 20 MEQ PO TBCR
40.0000 meq | EXTENDED_RELEASE_TABLET | Freq: Once | ORAL | Status: AC
  Administered 2021-03-01: 40 meq via ORAL
  Filled 2021-03-01: qty 2

## 2021-03-01 MED ORDER — POTASSIUM CHLORIDE CRYS ER 20 MEQ PO TBCR
40.0000 meq | EXTENDED_RELEASE_TABLET | Freq: Once | ORAL | Status: DC
Start: 2021-03-01 — End: 2021-03-01

## 2021-03-01 MED ORDER — NICOTINE 14 MG/24HR TD PT24: 14.0000 mg | MEDICATED_PATCH | Freq: Every day | TRANSDERMAL | Status: DC | PRN

## 2021-03-01 MED ORDER — ACETAMINOPHEN 650 MG RE SUPP: 650.0000 mg | Freq: Four times a day (QID) | RECTAL | Status: DC | PRN

## 2021-03-01 MED ORDER — ASPIRIN 325 MG PO TABS
325.0000 mg | ORAL_TABLET | Freq: Once | ORAL | Status: AC
  Administered 2021-03-01: 325 mg via ORAL
  Filled 2021-03-01: qty 1

## 2021-03-01 MED ORDER — IOHEXOL 350 MG/ML SOLN
100.0000 mL | Freq: Once | INTRAVENOUS | Status: AC | PRN
  Administered 2021-03-01: 100 mL via INTRAVENOUS

## 2021-03-01 MED ORDER — BENZONATATE 100 MG PO CAPS: 200.0000 mg | ORAL_CAPSULE | Freq: Three times a day (TID) | ORAL | Status: DC | PRN

## 2021-03-01 MED ORDER — STROKE: EARLY STAGES OF RECOVERY BOOK
Freq: Once | Status: DC
  Filled 2021-03-01: qty 1

## 2021-03-01 MED ORDER — ACETAMINOPHEN 325 MG PO TABS
650.0000 mg | ORAL_TABLET | Freq: Four times a day (QID) | ORAL | Status: DC | PRN
  Administered 2021-03-01 – 2021-03-02 (×2): 650 mg via ORAL
  Filled 2021-03-01 (×3): qty 2

## 2021-03-01 NOTE — ED Notes (Signed)
Received verbal report from Janice Coffin at this time

## 2021-03-01 NOTE — Consult Note (Addendum)
Neurology Consultation Note  Consult Requested by: Dr. Velia Meyer  Reason for Consult: syncope, left leg weakness  Consult Date: 03/01/21   The history was obtained from the pt.  During history and examination, all items were able to obtain unless otherwise noted.  History of Present Illness:  [name] is a 65 y.o. African American male with PMH of hypertension, hyperlipidemia, CAD status post CABG, cocaine abuse, cardiomyopathy, smoker, non-Hodgkin's lymphoma, stroke in 02/2019 presented to ED for syncope, headache, hypotension and left lower extremity worsening weakness.  Patient had a stroke in 02/2019 with left-sided weakness.  MRI showed right MCA multifocal infarcts.  CTA head and neck showed right M1 occlusion, severe bilateral siphon stenosis.  EF 40 to 45%, LDL 136, A1c 6.3.  He was discharged with DAPT for 3 weeks and Lipitor 40.  Patient states that for the last 1 week, he started coughing after his roommate.  Last night and this morning he was coughing, however he was able to walk at his baseline.  Since last stroke, he had slight weakness and numbness on left side but he was able to walk without difficulty without any device.  He went to class this morning, while standing up he fell to the ground and he could not recall why he fell.  By report, he had a syncope in the class.  On EMS arrival, he was on the ground, however eyes open alert, seems in discomfort.  In ED he became agitated, reported chest pain, headache and back pain, BP 92/62.  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  MRI negative for acute infarct.  However, patient continued complaining of left leg weakness worse than normal baseline.  Neurology was consulted.  Currently BP 110s.  LSN: Around noon in the class tPA Given: No: Outside window, no infarct on MRI  Past Medical History:  Diagnosis Date    CAD (coronary artery disease)    a. 04/2008 Cath: LM nl, LAD 50p, 78m, LCX min irregs, RCA 50p/m, 40d, RPDA 60-70ost; b. 04/2008 CABG x 4: LIMA->LAD, VG->D2, VG->RPDA->RPL.   Cancer ([place])    Cocaine abuse ([place])    a. Quit early 2019.   Ischemic cardiomyopathy    a. LV gram: EF 30-35%, apical/periapical AK.   Stroke ([place]) 02/2019   Stroke ([place]) 02/2019   Tobacco abuse     Past Surgical History:  Procedure Laterality Date   BYPASS GRAFT  2010   CARDIAC SURGERY  2010   IR IMAGING GUIDED PORT INSERTION  05/26/2019   SUPRACLAVICAL NODE BIOPSY Left 05/08/2019   Procedure: SUPRACLAVICAL NODE BIOPSY;  Surgeon: [name], MD;  Location: [facility] OR;  Service: Thoracic;  Laterality: Left;    Family History  Family history unknown: Yes    Social History:  reports that he has been smoking cigarettes. He has been smoking an average of .3 packs per day. He uses smokeless tobacco. He reports that he does not currently use alcohol. He reports that he does not currently use drugs after having used the following drugs: Cocaine.  Allergies: No Known Allergies  Current Facility-Administered Medications on File Prior to Encounter  Medication Dose Route Frequency Provider Last Rate Last Admin   heparin lock flush 100 unit/mL  500 Units Intracatheter Once [name], MD       heparin lock flush 100 unit/mL  500 Units Intracatheter Once [name], MD  sodium chloride flush (NS) 0.9 % injection 10 mL  10 mL Intracatheter Once [name], MD       sodium chloride flush (NS) 0.9 % injection 10 mL  10 mL Intracatheter Once [name], MD       Current Outpatient Medications on File Prior to Encounter  Medication Sig Dispense Refill   aspirin EC 81 MG tablet Take 1 tablet (81 mg total) by mouth daily. (Patient not taking: Reported on 05/19/2020) 30 tablet 3   rosuvastatin (CRESTOR) 20 MG tablet Take 1 tablet (20 mg total) by mouth daily. (Patient not taking: Reported on  03/01/2021) 90 tablet 3    Review of Systems: A full ROS was attempted today and was able to be performed.  Systems assessed include - Constitutional, Eyes, HENT, Respiratory, Cardiovascular, Gastrointestinal, Genitourinary, Integument/breast, Hematologic/lymphatic, Musculoskeletal, Neurological, Behavioral/Psych, Endocrine, Allergic/Immunologic - with pertinent responses as per HPI.  Physical Examination: Temp:  [98.9 F (37.2 C)] 98.9 F (37.2 C) (12/06 1445) Pulse Rate:  [69-82] 82 (12/06 2130) Resp:  [18-19] 18 (12/06 2130) BP: (92-137)/(62-86) 111/68 (12/06 2130) SpO2:  [96 %-98 %] 96 % (12/06 2130)  General - well nourished, well developed, in no apparent distress.    Ophthalmologic - fundi not visualized due to noncooperation.    Cardiovascular - regular rhythm and rate  Mental Status -  Level of arousal and orientation to time, place, and person were intact. Language including expression, naming, repetition, comprehension, reading, and writing was assessed and found intact. Fund of Knowledge was assessed and was intact.  Cranial Nerves II - XII - II - Vision intact OU. III, IV, VI - Extraocular movements intact. V - Facial sensation intact bilaterally. VII - slight left nasolabial fold flattening. VIII - Hearing & vestibular intact bilaterally. X - Palate elevates symmetrically. XI - Chin turning & shoulder shrug intact bilaterally. XII - Tongue protrusion intact.  Motor Strength - The patient's strength was normal in bilateral upper extremities and pronator drift was absent.  Right lower extremity 4/5 proximal and distal ankle DF/PF.  However, left lower extremity lack of effort on exam, giveaway weakness, complaining of left leg pain and left ankle pain, not able to dorsi or plantarflexion of ankle due to pain.  Motor Tone & Bulk - Muscle tone was assessed at the neck and appendages and was normal.  Bulk was normal and fasciculations were absent.   Reflexes - The  patient's reflexes were 2+in bilateral patellar reflex and diminished at b/l ankle reflex and 1+ bicep and tricep.  he had no pathological reflexes.  Sensory - Light touch, temperature/pinprick were assessed and were mildly decreased light touch sensation at left upper and lower extremity.  No sensory level.  However, patient refused proprioception and vibration test  Coordination - The patient had normal movements in the hands with no ataxia or dysmetria.  Tremor was absent.  Gait and Station - deferred  Data Reviewed: CT ANGIO HEAD NECK W WO CM  Result Date: 03/01/2021 CLINICAL DATA:  Neuro deficit, acute, stroke suspected more EXAM: CT ANGIOGRAPHY HEAD AND NECK TECHNIQUE: Multidetector CT imaging of the head and neck was performed using the standard protocol during bolus administration of intravenous contrast. Multiplanar CT image reconstructions and MIPs were obtained to evaluate the vascular anatomy. Carotid stenosis measurements (when applicable) are obtained utilizing NASCET criteria, using the distal internal carotid diameter as the denominator. CONTRAST:  186mL OMNIPAQUE IOHEXOL 350 MG/ML SOLN COMPARISON:  03/05/2019, correlation is made with MRI 03/04/2019. FINDINGS: CT  HEAD FINDINGS Brain: No acute infarct, hemorrhage, mass, mass effect, or midline shift. No hydrocephalus or extra-axial collection. Focal encephalomalacia in the right corona radiata, which correlates with the prior MRI. Vascular: No hyperdense vessel.  Please see CTA findings below. Skull: No acute osseous abnormality. Sinuses: Imaged portions are clear. Orbits: No acute finding. Review of the MIP images confirms the above findings CTA NECK FINDINGS Aortic arch: Standard branching. Imaged portion shows no evidence of aneurysm or dissection. No significant stenosis of the major arch vessel origins. Right carotid system: No evidence of dissection, stenosis (50% or greater) or occlusion. Left carotid system: No evidence of  dissection, stenosis (50% or greater) or occlusion. Vertebral arteries: Codominant. No evidence of dissection, stenosis (50% or greater) or occlusion. Skeleton: No acute osseous abnormality. Degenerative changes in the cervical spine. Status post median sternotomy. Other neck: Negative. Upper chest: For findings in the thorax, please see same day CT chest. Review of the MIP images confirms the above findings CTA HEAD FINDINGS Anterior circulation: Redemonstrated severe stenosis in the bilateral cavernous segment, not significantly changed from the prior exam. A1 segments patent. Normal anterior communicating artery. Anterior cerebral arteries are patent to their distal aspects. Redemonstrated right M1 occlusion, with reconstitution, unchanged. Moderate narrowing of the left M1, which has progressed slightly since the prior exam. Distal MCA branches perfused, although there is decreased perfusion in the right compared to the left. Posterior circulation: Mild irregularity in the right V4 segment, which is new compared to the prior exam. Vertebral arteries are otherwise patent to the vertebrobasilar junction without stenosis. Posterior inferior cerebral arteries patent bilaterally. Basilar patent to its distal aspect. Superior cerebellar arteries patent bilaterally. PCAs perfused to their distal aspects without stenosis. The bilateral posterior communicating arteries are not visualized. Venous sinuses: As permitted by contrast timing, patent. Anatomic variants: None significant. Review of the MIP images confirms the above findings IMPRESSION: 1. Redemonstrated occlusion of the right M1, with reconstitution. Distal right MCA branches are perfused, although less so than on the left. 2. Moderate narrowing of the left M1, which has progressed slightly since the prior exam. 3. 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 4. No hemodynamically significant  stenosis in the neck. Electronically Signed   By: Merilyn Baba M.D.   On: 03/01/2021 18:12   MR BRAIN WO CONTRAST  Result Date: 03/01/2021 CLINICAL DATA:  Neuro deficit, stroke suspected EXAM: MRI HEAD WITHOUT CONTRAST TECHNIQUE: Multiplanar, multiecho pulse sequences of the brain and surrounding structures were obtained without intravenous contrast. COMPARISON:  03/01/2021 CTA, correlation is also made with MRI 03/04/2019 FINDINGS: Brain: No restricted diffusion to suggest acute or subacute infarct. No acute hemorrhage, mass, mass effect, or midline shift. Remote infarcts in the right corona radiata, which correlate to acute infarcts seen on the 03/04/2019 exam. No hydrocephalus or extra-axial collection. Vascular: Normal flow voids. Skull and upper cervical spine: Normal marrow signal. Sinuses/Orbits: Negative. Other: The mastoids are well aerated. IMPRESSION: No acute intracranial process. Electronically Signed   By: Merilyn Baba M.D.   On: 03/01/2021 19:02   DG Chest Port 1 View  Result Date: 03/01/2021 CLINICAL DATA:  Chest pain. EXAM: PORTABLE CHEST 1 VIEW COMPARISON:  CT chest dated May 17, 2020. Chest x-ray dated May 08, 2019. FINDINGS: Unchanged right chest wall port catheter with tip in the proximal right atrium. The heart size and mediastinal contours are within normal limits. Prior CABG. Mildly increased interstitial opacities at the lung bases.  Chronic scarring in the medial right middle lobe. No focal consolidation, pleural effusion, or pneumothorax. No acute osseous abnormality. IMPRESSION: 1. Mildly increased interstitial opacities at the lung bases could reflect edema or atypical infection. 2. Appropriately positioned right chest wall port catheter. Electronically Signed   By: Titus Dubin M.D.   On: 03/01/2021 16:05   CT Angio Chest/Abd/Pel for Dissection W and/or W/WO  Result Date: 03/01/2021 CLINICAL DATA:  Chest and back pain.  Concern for aortic dissection. EXAM: CT  ANGIOGRAPHY CHEST, ABDOMEN AND PELVIS TECHNIQUE: Multidetector CT imaging through the chest, abdomen and pelvis was performed using the standard protocol during bolus administration of intravenous contrast. Multiplanar reconstructed images and MIPs were obtained and reviewed to evaluate the vascular anatomy. CONTRAST:  11mL OMNIPAQUE IOHEXOL 350 MG/ML SOLN COMPARISON:  Chest radiograph dated 03/01/2021. CT abdomen pelvis dated 05/17/2020. FINDINGS: Evaluation is limited due to streak artifact caused by patient's arms. CTA CHEST FINDINGS Cardiovascular: There is no cardiomegaly or pericardial effusion. Advanced 3 vessel coronary vascular calcification and postsurgical changes of CABG. The thoracic aorta is unremarkable. The origins of the great vessels of the aortic arch appear patent as visualized. The central pulmonary arteries are unremarkable for the degree of opacification. Right-sided Port-A-Cath with tip at the cavoatrial junction. Mediastinum/Nodes: No hilar or mediastinal adenopathy. The esophagus is grossly unremarkable. No mediastinal fluid collection. Lungs/Pleura: Minimal bibasilar dependent atelectasis. There is a 6 mm left upper lobe nodule (37/10). No focal consolidation, pleural effusion, pneumothorax. The central airways are patent. Musculoskeletal: Median sternotomy wires. Degenerative changes of the spine. No acute osseous pathology. Review of the MIP images confirms the above findings. CTA ABDOMEN AND PELVIS FINDINGS VASCULAR Aorta: Normal caliber aorta without aneurysm, dissection, vasculitis or significant stenosis. Celiac: Patent without evidence of aneurysm, dissection, vasculitis or significant stenosis. SMA: Patent without evidence of aneurysm, dissection, vasculitis or significant stenosis. Renals: Both renal arteries are patent without evidence of aneurysm, dissection, vasculitis, fibromuscular dysplasia or significant stenosis. IMA: Patent without evidence of aneurysm, dissection,  vasculitis or significant stenosis. Inflow: Mild atherosclerotic calcification. The iliac arteries are patent. No aneurysm or dissection. Veins: Suboptimally evaluated but grossly unremarkable. Review of the MIP images confirms the above findings. NON-VASCULAR No intra-abdominal free air or free fluid. Hepatobiliary: The liver is unremarkable.  No calcified gallstone. Pancreas: Unremarkable. No pancreatic ductal dilatation or surrounding inflammatory changes. Spleen: Indeterminate areas of hypodensity within the spleen similar to the CT of 05/17/2020, likely cysts. Adrenals/Urinary Tract: The adrenal glands are poorly visualized. There is no hydronephrosis on either side. There is symmetric enhancement and excretion of contrast by both kidneys. The visualized ureters appear unremarkable. Irregular posterior bladder wall with bladder diverticula. Stomach/Bowel: Evaluation of the bowel is limited in the absence of oral contrast and paucity of abdominal fat. No evidence of bowel obstruction. Lymphatic: No adenopathy. Reproductive: Mildly enlarged prostate gland. Other: Loss of subcutaneous fat and cachexia. Musculoskeletal: Degenerative changes of the spine. Old healed fracture of the right femoral neck. No acute osseous pathology. Review of the MIP images confirms the above findings. IMPRESSION: 1. No acute intrathoracic, abdominal, or pelvic pathology. No aortic aneurysm or dissection. 2. A 6 mm left upper lobe nodule. 3. Aortic Atherosclerosis (ICD10-I70.0). Electronically Signed   By: Anner Crete M.D.   On: 03/01/2021 17:49    Assessment: 64 y.o. male with PMH of hypertension, hyperlipidemia, CAD status post CABG, cocaine abuse, cardiomyopathy, smoker, non-Hodgkin's lymphoma, stroke in 02/2019 presented to ED for syncope, headache, hypotension and left lower extremity worsening  weakness. Patient had a stroke in 02/2019 with very mild left-sided residual weakness and numbness.  Today he had a syncopal event  in the class, but also developed agitated, chest pain, headache and back pain, and hypotension. 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  MRI negative for acute infarct.  Patient continued complaining of left leg weakness worse than normal baseline.  However on exam, significant left lower extremity giveaway weakness, lack of effort due to left leg pain and left ankle pain, but denies any back pain.  Exam does not consistent with spinal cord compression or spinal stenosis.  Recommend continue syncope work-up and PT OT consultation.  If no improvement with PT/OT, may consider MRI C/T spine.   Plan: Recommend hospitalist service admission for continued syncope work-up including 2D echo UDS, LDL and hemoglobin A1c PT/OT consultation.  If no improvement of left lower extremity weakness with PT/OT, may consider MRI C/T-spine Continue home aspirin and Crestor Will follow  Thank you for this consultation and allowing [place] to participate in the care of this patient.  Rosalin Hawking, MD PhD Stroke Neurology 03/01/2021 10:41 PM

## 2021-03-01 NOTE — ED Provider Notes (Addendum)
[facility] EMERGENCY DEPARTMENT Provider Note   CSN: [id number] Arrival date & time: 03/01/21  1435     History No chief complaint on file.   [name] is a 65 y.o. male.  HPI Patient brought in by EMS.  They were initially called out for chest pain but then told that the patient had syncopized.  He was found on the ground but was alert although seemingly mumbling and repeatedly saying "I am sorry "he had a normal mental status in triage when evaluated by triage provider.  He is complaining of chest pain, back pain, headache.  He is also complaining of some body aches and chills  He does not member passing out and states that he does not member having any left leg weakness after waking up from the syncopal episode however is experiencing this now.  10:15am came in normal As the day progressed ~12:15 started having head drooping and he was noted to be leaning.  Eventually he fell over. He couldn't talk but seemed uncomfortable. 12:30 couldn't walk.  Not able to say much at first but after a few minutes he started saying "i'm sorry." No facial droop. Weak all over at that time.      Some of the patient's history was provided by patient's teacher Ms Meredith Mody  Her phone number [phone]    Past Medical History:  Diagnosis Date   CAD (coronary artery disease)    a. 04/2008 Cath: LM nl, LAD 50p, 53m, LCX min irregs, RCA 50p/m, 40d, RPDA 60-70ost; b. 04/2008 CABG x 4: LIMA->LAD, VG->D2, VG->RPDA->RPL.   Cancer ([place])    Cocaine abuse ([place])    a. Quit early 2019.   Ischemic cardiomyopathy    a. LV gram: EF 30-35%, apical/periapical AK.   Stroke ([place]) 02/2019   Stroke ([place]) 02/2019   Tobacco abuse     Patient Active Problem List   Diagnosis Date Noted   Syncope 03/01/2021   Port-A-Cath in place 09/09/2019   Cancer related pain 07/01/2019   Non-Hodgkin lymphoma ([place]) 05/13/2019   Goals of care, counseling/discussion 05/13/2019   Encounter for  antineoplastic chemotherapy 05/13/2019   Mass of upper lobe of right lung 04/03/2019   Lymphadenopathy, inguinal 04/03/2019   Splenomegaly 04/03/2019   Lung mass 03/24/2019   Slow transit constipation    Inguinal mass    Chronic combined systolic and diastolic CHF (congestive heart failure) ([place])    Mass of left inguinal region    Greater trochanteric bursitis of left hip    Hyponatremia    Acute blood loss anemia    Chronic combined systolic (congestive) and diastolic (congestive) heart failure (HCC)    Essential hypertension    Left leg pain    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   Left hemiparesis ([place]) 03/06/2019   Acute cerebrovascular accident (CVA) ([place]) 03/04/2019   Chest pain 10/16/2017   Hyperlipidemia 06/16/2008   TOBACCO ABUSE 06/16/2008   MARIJUANA ABUSE 06/16/2008   Cocaine abuse ([place]) 06/16/2008   CAD, ARTERY BYPASS GRAFT 04/30/2008    Past Surgical History:  Procedure Laterality Date   BYPASS GRAFT  2010   CARDIAC SURGERY  2010   IR IMAGING GUIDED PORT INSERTION  05/26/2019   SUPRACLAVICAL NODE BIOPSY Left 05/08/2019   Procedure: SUPRACLAVICAL NODE BIOPSY;  Surgeon: [name], MD;  Location: [facility] OR;  Service: Thoracic;  Laterality: Left;       Family History  Family history unknown: Yes    Social  History   Tobacco Use   Smoking status: Some Days    Packs/day: 0.30    Types: Cigarettes   Smokeless tobacco: Current   Tobacco comments:    has smoked for 40+ yrs - at most 1ppd, currently 1ppwk.  Vaping Use   Vaping Use: Never used  Substance Use Topics   Alcohol use: Not Currently    Comment: quite > 30 yrs ago.   Drug use: Not Currently    Types: Cocaine    Comment: prev used cocaine - quit early 2019.    Home Medications Prior to Admission medications   Medication Sig Start Date End Date Taking? Authorizing Provider  aspirin EC 81 MG tablet Take 1 tablet (81 mg total) by mouth daily. Patient not taking: Reported on  05/19/2020 04/22/19   Frann Rider, NP  rosuvastatin (CRESTOR) 20 MG tablet Take 1 tablet (20 mg total) by mouth daily. Patient not taking: Reported on 03/01/2021 07/08/20   Jerline Pain, MD    Allergies    Patient has no known allergies.  Review of Systems   Review of Systems  Constitutional:  Negative for chills and fever.  HENT:  Negative for congestion.   Eyes:  Negative for pain.  Respiratory:  Negative for cough and shortness of breath.   Cardiovascular:  Positive for chest pain. Negative for leg swelling.  Gastrointestinal:  Negative for abdominal pain, diarrhea, nausea and vomiting.  Genitourinary:  Negative for dysuria.  Musculoskeletal:  Positive for myalgias.  Skin:  Negative for rash.  Neurological:  Positive for syncope, weakness and headaches. Negative for dizziness.   Physical Exam Updated Vital Signs BP 112/72   Pulse 71   Temp 98.9 F (37.2 C) (Oral)   Resp 19   SpO2 96%   Physical Exam Vitals and nursing note reviewed.  Constitutional:      General: He is not in acute distress. HENT:     Head: Normocephalic and atraumatic.     Nose: Nose normal.  Eyes:     General: No scleral icterus. Cardiovascular:     Rate and Rhythm: Normal rate and regular rhythm.     Pulses: Normal pulses.     Heart sounds: Normal heart sounds.     Comments: Port present in right upper chest  Bilateral right upper and left upper extremity pulses 3+ and symmetric Pulmonary:     Effort: Pulmonary effort is normal. No respiratory distress.     Breath sounds: No wheezing.  Abdominal:     Palpations: Abdomen is soft.     Tenderness: There is no abdominal tenderness. There is no guarding or rebound.  Musculoskeletal:     Cervical back: Normal range of motion.     Right lower leg: No edema.     Left lower leg: No edema.  Skin:    General: Skin is warm and dry.     Capillary Refill: Capillary refill takes less than 2 seconds.  Neurological:     Mental Status: He is alert.  Mental status is at baseline.     Comments: Left arm and left leg weakness Left-sided facial droop  Speech is clear with no dysarthria. Alert and oriented x3 Sensation intact in all 4 extremities  Psychiatric:        Mood and Affect: Mood normal.        Behavior: Behavior normal.    ED Results / Procedures / Treatments   Labs (all labs ordered are listed, but only abnormal results are  displayed) Labs Reviewed  COMPREHENSIVE METABOLIC PANEL - Abnormal; Notable for the following components:      Result Value   Calcium 8.8 (*)    Total Protein 6.4 (*)    All other components within normal limits  CBC WITH DIFFERENTIAL/PLATELET - Abnormal; Notable for the following components:   RBC 4.16 (*)    Lymphs Abs 0.5 (*)    All other components within normal limits  URINALYSIS, ROUTINE W REFLEX MICROSCOPIC - Abnormal; Notable for the following components:   Specific Gravity, Urine <1.005 (*)    All other components within normal limits  I-STAT CHEM 8, ED - Abnormal; Notable for the following components:   Glucose, Bld 67 (*)    All other components within normal limits  RESP PANEL BY RT-PCR (FLU A&B, COVID) ARPGX2  PROTIME-INR  RAPID URINE DRUG SCREEN, HOSP PERFORMED  HIV ANTIBODY (ROUTINE TESTING W REFLEX)  HEMOGLOBIN A1C  LIPID PANEL  MAGNESIUM  MAGNESIUM  COMPREHENSIVE METABOLIC PANEL  CBC  CBG MONITORING, ED  CBG MONITORING, ED  TROPONIN I (HIGH SENSITIVITY)  TROPONIN I (HIGH SENSITIVITY)    EKG EKG Interpretation  Date/Time:  Tuesday March 01 2021 14:52:22 EST Ventricular Rate:  79 PR Interval:  150 QRS Duration: 84 QT Interval:  376 QTC Calculation: 431 R Axis:   85 Text Interpretation: Sinus rhythm T wave abnormality, consider inferior ischemia improved since last tracing Confirmed by Blanchie Dessert [id number]) on 03/01/2021 5:35:09 PM  Radiology CT ANGIO HEAD NECK W WO CM  Result Date: 03/01/2021 CLINICAL DATA:  Neuro deficit, acute, stroke suspected more  EXAM: CT ANGIOGRAPHY HEAD AND NECK TECHNIQUE: Multidetector CT imaging of the head and neck was performed using the standard protocol during bolus administration of intravenous contrast. Multiplanar CT image reconstructions and MIPs were obtained to evaluate the vascular anatomy. Carotid stenosis measurements (when applicable) are obtained utilizing NASCET criteria, using the distal internal carotid diameter as the denominator. CONTRAST:  169mL OMNIPAQUE IOHEXOL 350 MG/ML SOLN COMPARISON:  03/05/2019, correlation is made with MRI 03/04/2019. FINDINGS: CT HEAD FINDINGS Brain: No acute infarct, hemorrhage, mass, mass effect, or midline shift. No hydrocephalus or extra-axial collection. Focal encephalomalacia in the right corona radiata, which correlates with the prior MRI. Vascular: No hyperdense vessel.  Please see CTA findings below. Skull: No acute osseous abnormality. Sinuses: Imaged portions are clear. Orbits: No acute finding. Review of the MIP images confirms the above findings CTA NECK FINDINGS Aortic arch: Standard branching. Imaged portion shows no evidence of aneurysm or dissection. No significant stenosis of the major arch vessel origins. Right carotid system: No evidence of dissection, stenosis (50% or greater) or occlusion. Left carotid system: No evidence of dissection, stenosis (50% or greater) or occlusion. Vertebral arteries: Codominant. No evidence of dissection, stenosis (50% or greater) or occlusion. Skeleton: No acute osseous abnormality. Degenerative changes in the cervical spine. Status post median sternotomy. Other neck: Negative. Upper chest: For findings in the thorax, please see same day CT chest. Review of the MIP images confirms the above findings CTA HEAD FINDINGS Anterior circulation: Redemonstrated severe stenosis in the bilateral cavernous segment, not significantly changed from the prior exam. A1 segments patent. Normal anterior communicating artery. Anterior cerebral arteries are  patent to their distal aspects. Redemonstrated right M1 occlusion, with reconstitution, unchanged. Moderate narrowing of the left M1, which has progressed slightly since the prior exam. Distal MCA branches perfused, although there is decreased perfusion in the right compared to the left. Posterior circulation: Mild irregularity in the right  V4 segment, which is new compared to the prior exam. Vertebral arteries are otherwise patent to the vertebrobasilar junction without stenosis. Posterior inferior cerebral arteries patent bilaterally. Basilar patent to its distal aspect. Superior cerebellar arteries patent bilaterally. PCAs perfused to their distal aspects without stenosis. The bilateral posterior communicating arteries are not visualized. Venous sinuses: As permitted by contrast timing, patent. Anatomic variants: None significant. Review of the MIP images confirms the above findings IMPRESSION: 1. Redemonstrated occlusion of the right M1, with reconstitution. Distal right MCA branches are perfused, although less so than on the left. 2. Moderate narrowing of the left M1, which has progressed slightly since the prior exam. 3. 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 4. No hemodynamically significant stenosis in the neck. Electronically Signed   By: Merilyn Baba M.D.   On: 03/01/2021 18:12   MR BRAIN WO CONTRAST  Result Date: 03/01/2021 CLINICAL DATA:  Neuro deficit, stroke suspected EXAM: MRI HEAD WITHOUT CONTRAST TECHNIQUE: Multiplanar, multiecho pulse sequences of the brain and surrounding structures were obtained without intravenous contrast. COMPARISON:  03/01/2021 CTA, correlation is also made with MRI 03/04/2019 FINDINGS: Brain: No restricted diffusion to suggest acute or subacute infarct. No acute hemorrhage, mass, mass effect, or midline shift. Remote infarcts in the right corona radiata, which correlate to acute infarcts seen on  the 03/04/2019 exam. No hydrocephalus or extra-axial collection. Vascular: Normal flow voids. Skull and upper cervical spine: Normal marrow signal. Sinuses/Orbits: Negative. Other: The mastoids are well aerated. IMPRESSION: No acute intracranial process. Electronically Signed   By: Merilyn Baba M.D.   On: 03/01/2021 19:02   DG Chest Port 1 View  Result Date: 03/01/2021 CLINICAL DATA:  Chest pain. EXAM: PORTABLE CHEST 1 VIEW COMPARISON:  CT chest dated May 17, 2020. Chest x-ray dated May 08, 2019. FINDINGS: Unchanged right chest wall port catheter with tip in the proximal right atrium. The heart size and mediastinal contours are within normal limits. Prior CABG. Mildly increased interstitial opacities at the lung bases. Chronic scarring in the medial right middle lobe. No focal consolidation, pleural effusion, or pneumothorax. No acute osseous abnormality. IMPRESSION: 1. Mildly increased interstitial opacities at the lung bases could reflect edema or atypical infection. 2. Appropriately positioned right chest wall port catheter. Electronically Signed   By: Titus Dubin M.D.   On: 03/01/2021 16:05   CT Angio Chest/Abd/Pel for Dissection W and/or W/WO  Result Date: 03/01/2021 CLINICAL DATA:  Chest and back pain.  Concern for aortic dissection. EXAM: CT ANGIOGRAPHY CHEST, ABDOMEN AND PELVIS TECHNIQUE: Multidetector CT imaging through the chest, abdomen and pelvis was performed using the standard protocol during bolus administration of intravenous contrast. Multiplanar reconstructed images and MIPs were obtained and reviewed to evaluate the vascular anatomy. CONTRAST:  170mL OMNIPAQUE IOHEXOL 350 MG/ML SOLN COMPARISON:  Chest radiograph dated 03/01/2021. CT abdomen pelvis dated 05/17/2020. FINDINGS: Evaluation is limited due to streak artifact caused by patient's arms. CTA CHEST FINDINGS Cardiovascular: There is no cardiomegaly or pericardial effusion. Advanced 3 vessel coronary vascular  calcification and postsurgical changes of CABG. The thoracic aorta is unremarkable. The origins of the great vessels of the aortic arch appear patent as visualized. The central pulmonary arteries are unremarkable for the degree of opacification. Right-sided Port-A-Cath with tip at the cavoatrial junction. Mediastinum/Nodes: No hilar or mediastinal adenopathy. The esophagus is grossly unremarkable. No mediastinal fluid collection. Lungs/Pleura: Minimal bibasilar dependent atelectasis. There is a 6 mm left upper lobe nodule (37/10). No  focal consolidation, pleural effusion, pneumothorax. The central airways are patent. Musculoskeletal: Median sternotomy wires. Degenerative changes of the spine. No acute osseous pathology. Review of the MIP images confirms the above findings. CTA ABDOMEN AND PELVIS FINDINGS VASCULAR Aorta: Normal caliber aorta without aneurysm, dissection, vasculitis or significant stenosis. Celiac: Patent without evidence of aneurysm, dissection, vasculitis or significant stenosis. SMA: Patent without evidence of aneurysm, dissection, vasculitis or significant stenosis. Renals: Both renal arteries are patent without evidence of aneurysm, dissection, vasculitis, fibromuscular dysplasia or significant stenosis. IMA: Patent without evidence of aneurysm, dissection, vasculitis or significant stenosis. Inflow: Mild atherosclerotic calcification. The iliac arteries are patent. No aneurysm or dissection. Veins: Suboptimally evaluated but grossly unremarkable. Review of the MIP images confirms the above findings. NON-VASCULAR No intra-abdominal free air or free fluid. Hepatobiliary: The liver is unremarkable.  No calcified gallstone. Pancreas: Unremarkable. No pancreatic ductal dilatation or surrounding inflammatory changes. Spleen: Indeterminate areas of hypodensity within the spleen similar to the CT of 05/17/2020, likely cysts. Adrenals/Urinary Tract: The adrenal glands are poorly visualized. There is no  hydronephrosis on either side. There is symmetric enhancement and excretion of contrast by both kidneys. The visualized ureters appear unremarkable. Irregular posterior bladder wall with bladder diverticula. Stomach/Bowel: Evaluation of the bowel is limited in the absence of oral contrast and paucity of abdominal fat. No evidence of bowel obstruction. Lymphatic: No adenopathy. Reproductive: Mildly enlarged prostate gland. Other: Loss of subcutaneous fat and cachexia. Musculoskeletal: Degenerative changes of the spine. Old healed fracture of the right femoral neck. No acute osseous pathology. Review of the MIP images confirms the above findings. IMPRESSION: 1. No acute intrathoracic, abdominal, or pelvic pathology. No aortic aneurysm or dissection. 2. A 6 mm left upper lobe nodule. 3. Aortic Atherosclerosis (ICD10-I70.0). Electronically Signed   By: Anner Crete M.D.   On: 03/01/2021 17:49    Procedures .Critical Care Performed by: Tedd Sias, PA Authorized by: Tedd Sias, PA   Critical care provider statement:    Critical care time (minutes):  35   Critical care time was exclusive of:  Separately billable procedures and treating other patients and teaching time   Critical care was time spent personally by me on the following activities:  Development of treatment plan with patient or surrogate, review of old charts, re-evaluation of patient's condition, pulse oximetry, ordering and review of radiographic studies, ordering and review of laboratory studies, ordering and performing treatments and interventions, obtaining history from patient or surrogate, examination of patient and evaluation of patient's response to treatment   Care discussed with: admitting provider     Medications Ordered in ED Medications  dextrose 50 % solution 50 mL (0 mLs Intravenous Hold 03/01/21 1833)  acetaminophen (TYLENOL) tablet 650 mg (has no administration in time range)    Or  acetaminophen (TYLENOL)  suppository 650 mg (has no administration in time range)   stroke: mapping our early stages of recovery book (has no administration in time range)  iohexol (OMNIPAQUE) 350 MG/ML injection 100 mL (100 mLs Intravenous Contrast Given 03/01/21 1725)    ED Course  I have reviewed the triage vital signs and the nursing notes.  Pertinent labs & imaging results that were available during my care of the patient were reviewed by me and considered in my medical decision making (see chart for details).  Clinical Course as of 03/01/21 1951  Tue Mar 01, 2021  1637 10:15am came in normal As the day progressed ~12:15 started having head drooping and he was noted  to be leaning.  Eventually he fell over. He couldn't talk but seemed uncomfortable. 12:30 couldn't walk.  Not able to say much at first but after a few minutes he started saying "i'm sorry." No facial droop. Weak all over at that time.  [WF]  1643 Discussed with Sal x2 - reviewing images.  [WF]    Clinical Course User Index [WF] Tedd Sias, PA   MDM Rules/Calculators/A&P                          Patient here after questionable syncopal episode also chest pain also with left upper extremity weakness which is not a new symptom but with new left lower extremity weakness.  Discussed with neurology who evaluated imaging. MRI brain and CT angio head and neck unremarkable Dissection studies CT angio chest abdomen pelvis negative for large PE or thoracic or dissection.  I personally reviewed all laboratory work and imaging.  Metabolic panel without any acute abnormality specifically kidney function within normal limits and no significant electrolyte abnormalities. CBC without leukocytosis or significant anemia.   Troponin within normal limits x2.  EKG nonischemic.  Will admit to hospitalist service for new left lower extremity weakness and syncope work-up.   Final Clinical Impression(s) / ED Diagnoses Final diagnoses:  Left leg  weakness  Syncope, unspecified syncope type    Rx / DC Orders ED Discharge Orders     None        Tedd Sias, [place] 03/01/21 1954    Tedd Sias, [place] 03/01/21 1954    Wyvonnia Dusky, MD 03/02/21 [phone]

## 2021-03-01 NOTE — ED Notes (Signed)
Attempted to complete a neuro exam on pt however pt will not follow commands. States that he wants to sleep and rest and that he is feeling bad. Able to answer all questions appropriately. Pt rolled over in bed and started to go back to sleep

## 2021-03-01 NOTE — ED Notes (Signed)
Patient transported to CT 

## 2021-03-01 NOTE — ED Notes (Signed)
Patient transported to MRI 

## 2021-03-01 NOTE — ED Notes (Signed)
Admitting notified of that is was unsafe to perform the standing BP for the orthostatic VS

## 2021-03-01 NOTE — ED Provider Notes (Signed)
Emergency Medicine Provider Triage Evaluation Note  [name] , a 65 y.o. male  was evaluated in triage.  Pt arrives via EMS.  They report they were initially called out for chest pain when they arrived fire department was there and the patient had reportedly had a syncopal episode and was found on the ground but alert.  With EMS he was alert and had a normal mental status upon arriving to the department patient seems to becoming increasingly uncomfortable and agitated.  Repeatedly asking to lay on the ground, complaining of pain in his upper back and a headache.  Patient has difficulty telling me what happened today.  Reports he does not feel well and feels like he has pains and chills going through his body.  Reports he has had some pain starting in his chest and going into his thoracic back and now has a headache.  Does not remember falling or passing out.  Able to provide limited additional history  Review of Systems  Positive: Chest pain, back pain, headache, syncope, chills Negative: Abdominal pain, vomiting  Physical Exam  BP 92/62   Pulse 80   Temp 98.9 F (37.2 C) (Oral)   Resp 18   SpO2 98%  Gen:   Awake, seems uncomfortable repeatedly asking to lay down, difficulty answering questions Resp:  Respirations are equal and unlabored MSK:   Moves extremities without difficulty but complaining of pain in his thoracic back, not reproducible on exam Other:    Medical Decision Making  Medically screening exam initiated at 2:39 PM.  Appropriate orders placed.  [name] was informed that the remainder of the evaluation will be completed by another provider, this initial triage assessment does not replace that evaluation, and the importance of remaining in the ED until their evaluation is complete.  Patient reportedly was complaining of chest pain and had a witnessed syncopal episode, EMS was called and patient was initially coherent but upon arriving to the ED seems more agitated  and is having difficulty answering questions or providing history, now complaining of back pain and headache.  Blood pressure mildly elevated with EMS, now 92/62 on arrival, concern for potential aortic dissection, will also get CT head given sudden headache, possible head trauma and change in mental status.  Patient needs acute bed for further evaluation. Triage nurse aware   Jacqlyn Larsen, PA-C 03/01/21 [place], [place], DO 03/01/21 1507

## 2021-03-01 NOTE — H&P (Signed)
History and Physical    PLEASE NOTE THAT DRAGON DICTATION SOFTWARE WAS USED IN THE CONSTRUCTION OF THIS NOTE.   [name] ZSM:[id number] DOB: 1955-08-25 DOA: 03/01/2021  PCP: Patient, No Pcp Per (Inactive) (will further assess) Patient coming from: home   I have personally briefly reviewed patient's old medical records in [place]  Chief Complaint: syncope  HPI: [name] is a 65 y.o. male with medical history significant for cad s/p cabg x 4 in 2010, prior ischemic stroke with reported residual weakness in LUE, non-hodkins lymphoma with right-sided port-a-cath, chronic systolic hf, who is admitted to [facility] on 03/01/2021 for further eval/management of suspected syncope after presenting from home to [facility] ED complaining of episode of loss of conciousness.   Of note: Dragon dictation not currently functioning. Therefore, portions of this note will be typed, as alternate means of data entry/documentation.   Patient expierenced an episode of loss of consciouness while his was still in chair attending a self-help / former addiction class , with episdoes occurring at approximately 1015 AM on 03/01/21. Per patient, episdoe of LOC was not a/w preceding sensation of dizziness, lightheadedness, diaphoresis, subjective sensation of impending loss of consciousness. He conveys that he was was out acute sympotom this morning until he awoke on the floor of the classroom with witnesses conveying that he had lost consciousness for less than 10 seconds, with the patient reportedly not exhibiting any evidence of confusion upon regaining consciousness. Unclear if pt hit his head as complentn of falling out of chair.   As it relates to syncope, the patient denies any recent, immediately preceding, or ensuing chest pain, sob, palpitations, diaphoresis, nausea, vomiting dizziness.  Denies any prior history of syncopal event.  no known h/o seizures. This episode was not associated with any  generalized tonic-clonic activity, nor associate with any tongue biting or loss of bowel/bladder function.    Patient with reportedly h/o prior acute ischemic cva in Dec '20 with reported h/o of residual LUE weakness. Upon regaing conscioussness from synopal episode today, patient reports new onset LLE weakness that was not present leiading up to this syncope. Witnesses of the syncope also convyed their convcern ofver potential left sided facial droop. Otherwise, pt denies any acute focal numbness, paresthesias, slurred speech, expressive aphasia, acute change in vision, dysphagia, vertigo.  Denies any associated or ensuing headache or neck pain. Denies any additional resultant acute arthralgias or myalgias. Dosen't take any meds as outpatient, including no aspirin, statin, althogh has a documented h/o HLD. No reported h/o DM, afib, osa,htn. Per patient, he acknowledges a prior history of cocaine use, but states that he last used cocaine in 2019. No recent/recurrent etoh use. No recent N/V/D.     ED Course:  Vital signs in the ED were notable for the following: AF, HR 71-80; bp initially 92/62 with subseuqnet bp's in the range of 102/72 - 137/86; rr 18-19; O2 96-98on RA.   Labs were notable for the following: poc cbg x 2 : 73-81; without any interval administered glucose/food/ d50. CMP notable for potassium 3.5 , creatinine .96 compared to most recent prio of 0.92 in Sept '22. Trop 4, with repeat increaing slightly to 5. INR 1.0. UA showed no wbc's. UDS ordered with result pending. Covid/flu screen negative.   Imaging and additional notable ED work-up: ekg, in comparison to most recent prior ekg from Jan'22, shows SR with HR 79, normal INT, t ave flattening in 2,3, avf which are imporved since  T wave flatteningin most recent prior ekg; t wave invserion in v3 v4, v5, imporoved from ost recent prior ekg, and no e/o ST elevation.   CTA chest/abd/pelvis WITH AORTIC PROTOCAL AS OPPOSED TO PE PROTOCOL: no  acute intrathoracid, intraabdoiinal or intrapelvic process, and no e/o aortic aneursym, disection , or thrombus.   CT hhead showed no acute process.  CTA neck showed no hemodynically signiciant stenosis. CTA head redomonstrated occlusion of right M1 with reconstitution ; distal right mca braches are perfused. Moderate narrowing of the left M1 , representing slight progression from prior. Also redemonsstrated severe stenosis in b/l cavernous segment, without signicant change from most recent prior CTA head in Dec '20.   MRI brain shows no acute process including no e/o acute ischemic infarct.   EDP discussed case w/o on-call neuro, with Dr. Erlinda Hong to fomrally consult. Additional neuro recs pending at this time.     Review of Systems: As per HPI otherwise 10 point review of systems negative.   Past Medical History:  Diagnosis Date   CAD (coronary artery disease)    a. 04/2008 Cath: LM nl, LAD 50p, 66m, LCX min irregs, RCA 50p/m, 40d, RPDA 60-70ost; b. 04/2008 CABG x 4: LIMA->LAD, VG->D2, VG->RPDA->RPL.   Cancer ([place])    Cocaine abuse ([place])    a. Quit early 2019.   Ischemic cardiomyopathy    a. LV gram: EF 30-35%, apical/periapical AK.   Stroke ([place]) 02/2019   Stroke ([place]) 02/2019   Tobacco abuse     Past Surgical History:  Procedure Laterality Date   BYPASS GRAFT  2010   CARDIAC SURGERY  2010   IR IMAGING GUIDED PORT INSERTION  05/26/2019   SUPRACLAVICAL NODE BIOPSY Left 05/08/2019   Procedure: SUPRACLAVICAL NODE BIOPSY;  Surgeon: [name], MD;  Location: [place];  Service: Thoracic;  Laterality: Left;    Social History:  reports that he has been smoking cigarettes. He has been smoking an average of .3 packs per day. He uses smokeless tobacco. He reports that he does not currently use alcohol. He reports that he does not currently use drugs after having used the following drugs: Cocaine.   No Known Allergies  Family History  Family history unknown: Yes     Prior to  Admission medications   Medication Sig Start Date End Date Taking? Authorizing Provider  aspirin EC 81 MG tablet Take 1 tablet (81 mg total) by mouth daily. Patient not taking: Reported on 05/19/2020 04/22/19   Frann Rider, NP  rosuvastatin (CRESTOR) 20 MG tablet Take 1 tablet (20 mg total) by mouth daily. Patient not taking: Reported on 03/01/2021 07/08/20   Jerline Pain, MD     Objective    Physical Exam: Vitals:   03/01/21 1445 03/01/21 1600 03/01/21 1615 03/01/21 1800  BP: 92/62 102/66 137/86 112/72  Pulse: 80 69 79 71  Resp: 18  18 19   Temp: 98.9 F (37.2 C)     TempSrc: Oral     SpO2: 98% 97% 98% 96%    General: appears to be stated age; alert, oriented Skin: warm, dry, no rash Head:  AT/[place] Mouth:  Oral mucosa membranes appear moist, normal dentition Neck: supple; trachea midline Heart:  RRR; did not appreciate any M/R/G Lungs: CTAB, did not appreciate any wheezes, rales, or rhonchi Abdomen: + BS; soft, ND, NT Vascular: 2+ pedal pulses b/l; 2+ radial pulses b/l Extremities: no peripheral edema, no muscle wasting Neuro: 5/5 strength of the proximal and distal flexors and  extensors of the upper extremities b/l as well as the RLE; 2/5 strength of LLE. cranial nerves II through XII grossly intact; no pronator drift; no evidence suggestive of slurred speech, dysarthria, or facial droop; No tremors.    Labs on Admission: I have personally reviewed following labs and imaging studies  CBC: Recent Labs  Lab 03/01/21 1451 03/01/21 1521  WBC 4.9  --   NEUTROABS 4.1  --   HGB 13.4 14.3  HCT 41.5 42.0  MCV 99.8  --   PLT 168  --    Basic Metabolic Panel: Recent Labs  Lab 03/01/21 1451 03/01/21 1521  NA 138 142  K 3.5 3.5  CL 105 104  CO2 26  --   GLUCOSE 74 67*  BUN 10 10  CREATININE 0.96 0.90  CALCIUM 8.8*  --    GFR: CrCl cannot be calculated (Unknown ideal weight.). Liver Function Tests: Recent Labs  Lab 03/01/21 1451  AST 17  ALT 12  ALKPHOS 65   BILITOT 0.9  PROT 6.4*  ALBUMIN 3.6   No results for input(s): LIPASE, AMYLASE in the last 168 hours. No results for input(s): AMMONIA in the last 168 hours. Coagulation Profile: Recent Labs  Lab 03/01/21 1451  INR 1.0   Cardiac Enzymes: No results for input(s): CKTOTAL, CKMB, CKMBINDEX, TROPONINI in the last 168 hours. BNP (last 3 results) No results for input(s): PROBNP in the last 8760 hours. HbA1C: No results for input(s): HGBA1C in the last 72 hours. CBG: Recent Labs  Lab 03/01/21 1614 03/01/21 1824  GLUCAP 73 81   Lipid Profile: No results for input(s): CHOL, HDL, LDLCALC, TRIG, CHOLHDL, LDLDIRECT in the last 72 hours. Thyroid Function Tests: No results for input(s): TSH, T4TOTAL, FREET4, T3FREE, THYROIDAB in the last 72 hours. Anemia Panel: No results for input(s): VITAMINB12, FOLATE, FERRITIN, TIBC, IRON, RETICCTPCT in the last 72 hours. Urine analysis:    Component Value Date/Time   COLORURINE YELLOW 03/01/2021 1742   APPEARANCEUR CLEAR 03/01/2021 1742   LABSPEC <1.005 (L) 03/01/2021 1742   PHURINE 7.0 03/01/2021 1742   GLUCOSEU NEGATIVE 03/01/2021 1742   HGBUR NEGATIVE 03/01/2021 1742   BILIRUBINUR NEGATIVE 03/01/2021 1742   KETONESUR NEGATIVE 03/01/2021 1742   PROTEINUR NEGATIVE 03/01/2021 1742   UROBILINOGEN 1.0 04/30/2008 0423   NITRITE NEGATIVE 03/01/2021 1742   LEUKOCYTESUR NEGATIVE 03/01/2021 1742    Radiological Exams on Admission: CT ANGIO HEAD NECK W WO CM  Result Date: 03/01/2021 CLINICAL DATA:  Neuro deficit, acute, stroke suspected more EXAM: CT ANGIOGRAPHY HEAD AND NECK TECHNIQUE: Multidetector CT imaging of the head and neck was performed using the standard protocol during bolus administration of intravenous contrast. Multiplanar CT image reconstructions and MIPs were obtained to evaluate the vascular anatomy. Carotid stenosis measurements (when applicable) are obtained utilizing NASCET criteria, using the distal internal carotid diameter  as the denominator. CONTRAST:  138mL OMNIPAQUE IOHEXOL 350 MG/ML SOLN COMPARISON:  03/05/2019, correlation is made with MRI 03/04/2019. FINDINGS: CT HEAD FINDINGS Brain: No acute infarct, hemorrhage, mass, mass effect, or midline shift. No hydrocephalus or extra-axial collection. Focal encephalomalacia in the right corona radiata, which correlates with the prior MRI. Vascular: No hyperdense vessel.  Please see CTA findings below. Skull: No acute osseous abnormality. Sinuses: Imaged portions are clear. Orbits: No acute finding. Review of the MIP images confirms the above findings CTA NECK FINDINGS Aortic arch: Standard branching. Imaged portion shows no evidence of aneurysm or dissection. No significant stenosis of the major arch vessel origins. Right  carotid system: No evidence of dissection, stenosis (50% or greater) or occlusion. Left carotid system: No evidence of dissection, stenosis (50% or greater) or occlusion. Vertebral arteries: Codominant. No evidence of dissection, stenosis (50% or greater) or occlusion. Skeleton: No acute osseous abnormality. Degenerative changes in the cervical spine. Status post median sternotomy. Other neck: Negative. Upper chest: For findings in the thorax, please see same day CT chest. Review of the MIP images confirms the above findings CTA HEAD FINDINGS Anterior circulation: Redemonstrated severe stenosis in the bilateral cavernous segment, not significantly changed from the prior exam. A1 segments patent. Normal anterior communicating artery. Anterior cerebral arteries are patent to their distal aspects. Redemonstrated right M1 occlusion, with reconstitution, unchanged. Moderate narrowing of the left M1, which has progressed slightly since the prior exam. Distal MCA branches perfused, although there is decreased perfusion in the right compared to the left. Posterior circulation: Mild irregularity in the right V4 segment, which is new compared to the prior exam. Vertebral arteries  are otherwise patent to the vertebrobasilar junction without stenosis. Posterior inferior cerebral arteries patent bilaterally. Basilar patent to its distal aspect. Superior cerebellar arteries patent bilaterally. PCAs perfused to their distal aspects without stenosis. The bilateral posterior communicating arteries are not visualized. Venous sinuses: As permitted by contrast timing, patent. Anatomic variants: None significant. Review of the MIP images confirms the above findings IMPRESSION: 1. Redemonstrated occlusion of the right M1, with reconstitution. Distal right MCA branches are perfused, although less so than on the left. 2. Moderate narrowing of the left M1, which has progressed slightly since the prior exam. 3. 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 4. No hemodynamically significant stenosis in the neck. Electronically Signed   By: Merilyn Baba M.D.   On: 03/01/2021 18:12   MR BRAIN WO CONTRAST  Result Date: 03/01/2021 CLINICAL DATA:  Neuro deficit, stroke suspected EXAM: MRI HEAD WITHOUT CONTRAST TECHNIQUE: Multiplanar, multiecho pulse sequences of the brain and surrounding structures were obtained without intravenous contrast. COMPARISON:  03/01/2021 CTA, correlation is also made with MRI 03/04/2019 FINDINGS: Brain: No restricted diffusion to suggest acute or subacute infarct. No acute hemorrhage, mass, mass effect, or midline shift. Remote infarcts in the right corona radiata, which correlate to acute infarcts seen on the 03/04/2019 exam. No hydrocephalus or extra-axial collection. Vascular: Normal flow voids. Skull and upper cervical spine: Normal marrow signal. Sinuses/Orbits: Negative. Other: The mastoids are well aerated. IMPRESSION: No acute intracranial process. Electronically Signed   By: Merilyn Baba M.D.   On: 03/01/2021 19:02   DG Chest Port 1 View  Result Date: 03/01/2021 CLINICAL DATA:  Chest pain. EXAM:  PORTABLE CHEST 1 VIEW COMPARISON:  CT chest dated May 17, 2020. Chest x-ray dated May 08, 2019. FINDINGS: Unchanged right chest wall port catheter with tip in the proximal right atrium. The heart size and mediastinal contours are within normal limits. Prior CABG. Mildly increased interstitial opacities at the lung bases. Chronic scarring in the medial right middle lobe. No focal consolidation, pleural effusion, or pneumothorax. No acute osseous abnormality. IMPRESSION: 1. Mildly increased interstitial opacities at the lung bases could reflect edema or atypical infection. 2. Appropriately positioned right chest wall port catheter. Electronically Signed   By: Titus Dubin M.D.   On: 03/01/2021 16:05   CT Angio Chest/Abd/Pel for Dissection W and/or W/WO  Result Date: 03/01/2021 CLINICAL DATA:  Chest and back pain.  Concern for aortic dissection. EXAM: CT ANGIOGRAPHY CHEST, ABDOMEN AND PELVIS  TECHNIQUE: Multidetector CT imaging through the chest, abdomen and pelvis was performed using the standard protocol during bolus administration of intravenous contrast. Multiplanar reconstructed images and MIPs were obtained and reviewed to evaluate the vascular anatomy. CONTRAST:  158mL OMNIPAQUE IOHEXOL 350 MG/ML SOLN COMPARISON:  Chest radiograph dated 03/01/2021. CT abdomen pelvis dated 05/17/2020. FINDINGS: Evaluation is limited due to streak artifact caused by patient's arms. CTA CHEST FINDINGS Cardiovascular: There is no cardiomegaly or pericardial effusion. Advanced 3 vessel coronary vascular calcification and postsurgical changes of CABG. The thoracic aorta is unremarkable. The origins of the great vessels of the aortic arch appear patent as visualized. The central pulmonary arteries are unremarkable for the degree of opacification. Right-sided Port-A-Cath with tip at the cavoatrial junction. Mediastinum/Nodes: No hilar or mediastinal adenopathy. The esophagus is grossly unremarkable. No mediastinal fluid  collection. Lungs/Pleura: Minimal bibasilar dependent atelectasis. There is a 6 mm left upper lobe nodule (37/10). No focal consolidation, pleural effusion, pneumothorax. The central airways are patent. Musculoskeletal: Median sternotomy wires. Degenerative changes of the spine. No acute osseous pathology. Review of the MIP images confirms the above findings. CTA ABDOMEN AND PELVIS FINDINGS VASCULAR Aorta: Normal caliber aorta without aneurysm, dissection, vasculitis or significant stenosis. Celiac: Patent without evidence of aneurysm, dissection, vasculitis or significant stenosis. SMA: Patent without evidence of aneurysm, dissection, vasculitis or significant stenosis. Renals: Both renal arteries are patent without evidence of aneurysm, dissection, vasculitis, fibromuscular dysplasia or significant stenosis. IMA: Patent without evidence of aneurysm, dissection, vasculitis or significant stenosis. Inflow: Mild atherosclerotic calcification. The iliac arteries are patent. No aneurysm or dissection. Veins: Suboptimally evaluated but grossly unremarkable. Review of the MIP images confirms the above findings. NON-VASCULAR No intra-abdominal free air or free fluid. Hepatobiliary: The liver is unremarkable.  No calcified gallstone. Pancreas: Unremarkable. No pancreatic ductal dilatation or surrounding inflammatory changes. Spleen: Indeterminate areas of hypodensity within the spleen similar to the CT of 05/17/2020, likely cysts. Adrenals/Urinary Tract: The adrenal glands are poorly visualized. There is no hydronephrosis on either side. There is symmetric enhancement and excretion of contrast by both kidneys. The visualized ureters appear unremarkable. Irregular posterior bladder wall with bladder diverticula. Stomach/Bowel: Evaluation of the bowel is limited in the absence of oral contrast and paucity of abdominal fat. No evidence of bowel obstruction. Lymphatic: No adenopathy. Reproductive: Mildly enlarged prostate  gland. Other: Loss of subcutaneous fat and cachexia. Musculoskeletal: Degenerative changes of the spine. Old healed fracture of the right femoral neck. No acute osseous pathology. Review of the MIP images confirms the above findings. IMPRESSION: 1. No acute intrathoracic, abdominal, or pelvic pathology. No aortic aneurysm or dissection. 2. A 6 mm left upper lobe nodule. 3. Aortic Atherosclerosis (ICD10-I70.0). Electronically Signed   By: Anner Crete M.D.   On: 03/01/2021 17:49     EKG: Independently reviewed, with result as described above.    Assessment/Plan   Principal Problem:   Syncope Active Problems:   Hyperlipidemia   TOBACCO ABUSE   Cocaine abuse ([facility])   Left leg weakness   Chronic systolic CHF (congestive heart failure) ([place])    #) Syncope: episode of syncope that occurred while seated, without associited prodrome, decreasing likelhood of orthostatic vs vasovagal etiologies. Of note, not on any BP meds at home. In setting of reported associated acute LLE weakness and reportedly acute left facial droop (spontaneously resolved by  time of my eval), ddx included TIA vis acute ischemic stroke, altough mri brain shows no acute process. CT head no acute bleed. CTA head and neck,  as detailed above, without significant contributory chanrge relative to most recent prior correspodingi imaging in dec 2020. Presentation also less suggestive of seizures.   Presentation appears less consistent with ACS at this time, with serial troponin found to be nonelevated, presenting EKG showing no evidence of acute ischemic changes, and the absence of any associated CP. In absence of prodrome, ddx also includes ventribular  arrhythmia , while noting histroy of cocaine use. Will  closely monitor on telemetry overnight for any evidence of potential contributory arrhythmia, while also assessing serum Mg level.   Dr. Erlinda Hong of neuro will formally consult, with additional recs pending at this time.    Plan: I  have placed a nursing communication order requesting that orthostatic vital signs x 1 set be checked and documented. Monitor on telemetry. Monitor strict I's and O's.  Add-on serum Mg level. Check CMP, CBC, serum Mg level in the AM. Fall precautions ordered. Trend trop. Echo ordered for AM. Serial neuro checks. Neuro consulted, as above, with additional recs pending. PT/OT consults placed. Check lipid panel and A1c. Aspirin 325 mg po x 1. Serial accuhecks to eval for hypoglycemia. F/u UDS result. Kcl 40 mEq po x 1 .        #) Chronic systolic heart failure: documented history of such, with most recent echocardiogram performed in June 2021 notable for LV EF 40-45%, indeteriminant diastolic parameters, and nml RV systolic function, as well as no significant valve pathology. Not on any associcated medications as outpatient, including no bb, ace-I/arb, diuertic . No clinical/radiogrpahic  evidence to suggest acutely decompensated heart failure at this time.   Plan: monitor strict I's & O's and daily weights. Repeat BMP in the morning. Check serum magnesium level. Consideration for outpatient initiation of bb/ace-I or arb. Echo ordered as compoonent of syncope / potential acute focal neuro deficit work-up, as detailed above.        #) h/o cocaine abuse: documented h/o such, with patient reporting that he has not used cocaine since 2019. In setting of presenting syncope, will check UDS to further eval.   Plan: UDS. Echo in AM, as above.        #) Hyperlipidemia: documented h/o such. Pt convyes that this is managed via lifestyle modifcations as outpatient, in absence of any pharmacologi intervention.    plan: check lipid panel in AM, as above.       #) chronic tobacco abuse: patient acknoleweges that he is current smoker, although has recently reduced daily cigarrett smoking to about 0.25 ppd, relative to prior frequncy of 1.0 ppd x 40 years.    Plan: prn nicotine patch ordered.  Counsled pt on importance of complete discontinaution of smoking, particularly givien his h/o prior acute ishchemic storke.      DVT prophylaxis: SCD's   Code Status: DNR (confirmed per my discussions with patient) Family Communication: none Disposition Plan: Per Rounding Team Consults called: Dr. Erlinda Hong of neurology consulted, as further detailed above.   Admission status: obs; med-tele    PLEASE NOTE THAT DRAGON DICTATION SOFTWARE WAS USED IN THE CONSTRUCTION OF THIS NOTE.   [place] DO [facility]  From [place]   03/01/2021, 7:43 PM

## 2021-03-01 NOTE — ED Triage Notes (Signed)
Patient arrived by [facility] [place] with complaint of chest pain and back pain. On arrival patient asking to lay down and appears restless. During EKG patient states that I think I have the flu. Unable to report any medical hx

## 2021-03-02 ENCOUNTER — Observation Stay (HOSPITAL_BASED_OUTPATIENT_CLINIC_OR_DEPARTMENT_OTHER): Payer: Medicare Other

## 2021-03-02 DIAGNOSIS — I5022 Chronic systolic (congestive) heart failure: Secondary | ICD-10-CM | POA: Diagnosis not present

## 2021-03-02 DIAGNOSIS — Z951 Presence of aortocoronary bypass graft: Secondary | ICD-10-CM

## 2021-03-02 DIAGNOSIS — F17200 Nicotine dependence, unspecified, uncomplicated: Secondary | ICD-10-CM | POA: Diagnosis not present

## 2021-03-02 DIAGNOSIS — E7800 Pure hypercholesterolemia, unspecified: Secondary | ICD-10-CM | POA: Diagnosis not present

## 2021-03-02 DIAGNOSIS — F1410 Cocaine abuse, uncomplicated: Secondary | ICD-10-CM | POA: Diagnosis not present

## 2021-03-02 DIAGNOSIS — R55 Syncope and collapse: Secondary | ICD-10-CM | POA: Diagnosis not present

## 2021-03-02 DIAGNOSIS — F1290 Cannabis use, unspecified, uncomplicated: Secondary | ICD-10-CM

## 2021-03-02 DIAGNOSIS — R29898 Other symptoms and signs involving the musculoskeletal system: Secondary | ICD-10-CM | POA: Diagnosis present

## 2021-03-02 LAB — RAPID URINE DRUG SCREEN, HOSP PERFORMED
Amphetamines: NOT DETECTED
Barbiturates: NOT DETECTED
Benzodiazepines: NOT DETECTED
Cocaine: POSITIVE — AB
Opiates: NOT DETECTED
Tetrahydrocannabinol: POSITIVE — AB

## 2021-03-02 LAB — COMPREHENSIVE METABOLIC PANEL
ALT: 15 U/L (ref 0–44)
AST: 17 U/L (ref 15–41)
Albumin: 3.3 g/dL — ABNORMAL LOW (ref 3.5–5.0)
Alkaline Phosphatase: 62 U/L (ref 38–126)
Anion gap: 8 (ref 5–15)
BUN: 8 mg/dL (ref 8–23)
CO2: 23 mmol/L (ref 22–32)
Calcium: 8.6 mg/dL — ABNORMAL LOW (ref 8.9–10.3)
Chloride: 105 mmol/L (ref 98–111)
Creatinine, Ser: 0.93 mg/dL (ref 0.61–1.24)
GFR, Estimated: >60 mL/min (ref >60)
Glucose, Bld: 94 mg/dL (ref 70–99)
Potassium: 4.2 mmol/L (ref 3.5–5.1)
Sodium: 136 mmol/L (ref 135–145)
Total Bilirubin: 0.8 mg/dL (ref 0.3–1.2)
Total Protein: 6 g/dL — ABNORMAL LOW (ref 6.5–8.1)

## 2021-03-02 LAB — LIPID PANEL
Cholesterol: 206 mg/dL — ABNORMAL HIGH (ref 0–200)
HDL: 57 mg/dL (ref >40)
LDL Cholesterol: 137 mg/dL — ABNORMAL HIGH (ref 0–99)
Total CHOL/HDL Ratio: 3.6 RATIO
Triglycerides: 59 mg/dL (ref <150)
VLDL: 12 mg/dL (ref 0–40)

## 2021-03-02 LAB — CBC
HCT: 39.5 % (ref 39.0–52.0)
Hemoglobin: 13.4 g/dL (ref 13.0–17.0)
MCH: 33.1 pg (ref 26.0–34.0)
MCHC: 33.9 g/dL (ref 30.0–36.0)
MCV: 97.5 fL (ref 80.0–100.0)
Platelets: 156 10*3/uL (ref 150–400)
RBC: 4.05 MIL/uL — ABNORMAL LOW (ref 4.22–5.81)
RDW: 13.2 % (ref 11.5–15.5)
WBC: 5.3 10*3/uL (ref 4.0–10.5)
nRBC: 0 % (ref 0.0–0.2)

## 2021-03-02 LAB — MAGNESIUM
Magnesium: 2 mg/dL (ref 1.7–2.4)
Magnesium: 2 mg/dL (ref 1.7–2.4)

## 2021-03-02 LAB — BRAIN NATRIURETIC PEPTIDE: B Natriuretic Peptide: 81.8 pg/mL (ref 0.0–100.0)

## 2021-03-02 LAB — ECHOCARDIOGRAM COMPLETE
Area-P 1/2: 4.71 cm2
Calc EF: 42 %
S' Lateral: 3.9 cm
Single Plane A2C EF: 41.9 %
Single Plane A4C EF: 43.3 %

## 2021-03-02 LAB — HEMOGLOBIN A1C
Hgb A1c MFr Bld: 5.5 % (ref 4.8–5.6)
Mean Plasma Glucose: 111.15 mg/dL

## 2021-03-02 LAB — PROCALCITONIN: Procalcitonin: <0.1 ng/mL

## 2021-03-02 LAB — TROPONIN I (HIGH SENSITIVITY): Troponin I (High Sensitivity): 10 ng/L (ref <18)

## 2021-03-02 LAB — HIV ANTIBODY (ROUTINE TESTING W REFLEX): HIV Screen 4th Generation wRfx: NONREACTIVE

## 2021-03-02 MED ORDER — HEPARIN SOD (PORK) LOCK FLUSH 100 UNIT/ML IV SOLN
500.0000 [IU] | INTRAVENOUS | Status: AC | PRN
  Administered 2021-03-02: 500 [IU]
  Filled 2021-03-02: qty 5

## 2021-03-02 MED ORDER — CHLORHEXIDINE GLUCONATE CLOTH 2 % EX PADS: 6.0000 | MEDICATED_PAD | Freq: Every day | CUTANEOUS | Status: DC

## 2021-03-02 MED ORDER — ASPIRIN EC 81 MG PO TBEC: 81.0000 mg | DELAYED_RELEASE_TABLET | Freq: Every day | ORAL | 3 refills | Status: AC

## 2021-03-02 MED ORDER — ROSUVASTATIN CALCIUM 20 MG PO TABS: 20.0000 mg | ORAL_TABLET | Freq: Every day | ORAL | 3 refills | Status: AC

## 2021-03-02 NOTE — TOC CAGE-AID Note (Signed)
Transition of Care [facility]) - CAGE-AID Screening   Patient Details  Name: [name] MRN: [id number] Date of Birth: 05/27/1955  [facility]) CM/SW Contact:    Pollie Friar, RN Phone Number: 03/02/2021, 2:49 PM   Clinical Narrative: Patient wouldn't participate in [place]. CM provided resources for inpatient/ outpatient counseling.   CAGE-AID Screening: Substance Abuse Screening unable to be completed due to: : Patient Refused

## 2021-03-02 NOTE — Progress Notes (Signed)
Discharge instructions discussed with patient, All questions answered. IV removed, telemetry discontinued. Port de- accessed by IV team nurse. Pt wheeled off unit for transport by nephew.  Gwendolyn Grant, RN

## 2021-03-02 NOTE — Progress Notes (Signed)
Occupational Therapy Evaluation Evaluation limited as pt refusing to complete any mobility. Pt inappropriately cussing at therapist, telling therapist to return in "3 hours". Per nsg pt being moved to the floor. Will attempt again after lunch.    03/02/21 1100 03/02/21 1400  OT Visit Information  Last OT Received On 03/02/21  --   Assistance Needed +1  --   History of Present Illness 65 y.o. African American male 65 y.o. African American male presented to ED for syncope, headache, hypotension and left lower extremity worsening weakness.  PMH of hypertension, hyperlipidemia, CAD status post CABG, cocaine abuse, cardiomyopathy, smoker, non-Hodgkin's lymphoma, stroke in 02/2019 with residual L sided weakness.  --   Precautions  Precautions Fall  --   Precaution Comments port  --   Home Living  Family/patient expects to be discharged to: Private residence  --   Living Arrangements Other relatives  --   Available Help at Discharge Available 24 hours/day  --   Type of Home Apartment  --   Home Access Stairs to enter  --   Entrance Stairs-Number of Steps 1  --   Home Layout One level  --   Bathroom Shower/Tub Tub/shower unit  --   [place] Accessibility Yes  --   How Accessible Accessible via walker  --   [place] (2 wheels);Cane - single point  --   Prior Function  Prior Level of Function  Independent/Modified Independent  --   Communication  Communication No difficulties  --   Pain Assessment  Faces Pain Scale 4  --   Pain Location throat  --   Pain Descriptors / Indicators Aching;Burning  --   Cognition  Arousal/Alertness Awake/alert  --   Behavior During Therapy Agitated;Restless;Impulsive  --   Overall Cognitive Status No family/caregiver present to determine baseline cognitive functioning  --   General Comments most likely at baseline  --   Upper Extremity Assessment  Upper Extremity Assessment Overall WFL for tasks  assessed (L residual weakness form prior CVA)  --   Cervical / Trunk Assessment  Cervical / Trunk Assessment Kyphotic  --   Vision- History  Patient Visual Report Blurring of vision  --   Transfers  General transfer comment pr refused  --   OT - End of Session  Activity Tolerance Other (comment) (pt limited participation)  --   Patient left in bed;with call bell/phone within reach  --   Nurse Communication Need for lift equipment (DC needs)  --   OT Assessment  OT Recommendation/Assessment  (will further assess)  --   OT Visit Diagnosis Other abnormalities of gait and mobility (R26.89);Unsteadiness on feet (R26.81)  --   OT Problem List Impaired balance (sitting and/or standing);Decreased activity tolerance;Decreased strength  --   AM-PAC OT "6 Clicks" Daily Activity Outcome Measure (Version 2)  Help from another person eating meals? 4 4  Help from another person taking care of personal grooming? 3 4  Help from another person toileting, which includes using toliet, bedpan, or urinal? 3 3  Help from another person bathing (including washing, rinsing, drying)? 3 3  Help from another person to put on and taking off regular upper body clothing? 3 3  Help from another person to put on and taking off regular lower body clothing? 3 3  6  Click Score 19 20  Progressive Mobility  What is the highest level of mobility based on the progressive mobility assessment?  (  pt delcined)  --   OT Recommendation  Recommendations for Other Services PT consult  --   OT Equipment Other (comment) (RW)  --   Individuals Consulted  Consulted and Agree with Results and Recommendations Patient  --   Acute Rehab OT Goals  Patient Stated Goal to be left alone for 3 hours  --   OT Time Calculation  OT Start Time (ACUTE ONLY) 1046  --   OT Stop Time (ACUTE ONLY) 1108  --   OT Time Calculation (min) 22 min  --   OT General Charges  $OT Visit 1 Visit  --   OT Evaluation  $OT Eval Moderate Complexity 1 low   --   Written Expression  Dominant Hand Right  --   Maurie Boettcher, OT/L   Acute OT Clinical Specialist [place] Pager [phone] Office [phone]

## 2021-03-02 NOTE — Progress Notes (Incomplete)
°  Echocardiogram 2D Echocardiogram has been performed.  Fidel Levy 03/02/2021, 11:57 AM

## 2021-03-02 NOTE — Progress Notes (Signed)
PT Evaluation Note   03/02/21 1550  PT Visit Information  Last PT Received On 03/02/21  Assistance Needed +1  History of Present Illness 65 y.o. African American male 65 y.o. African American male presented to ED for syncope, headache, hypotension and left lower extremity worsening weakness.  PMH of hypertension, hyperlipidemia, CAD status post CABG, cocaine abuse, cardiomyopathy, smoker, non-Hodgkin's lymphoma, stroke in 02/2019 with residual L sided weakness.  Precautions  Precautions Fall  Precaution Comments port  Restrictions  Weight Bearing Restrictions No  Home Living  Family/patient expects to be discharged to: Private residence  Living Arrangements Other relatives  Available Help at Discharge Available 24 hours/day  Type of [place] to enter  Entrance Stairs-Number of Steps 1  [place] One level  Bathroom Environmental health practitioner (2 wheels);Cane - single point  Prior Function  Prior Level of Function  Independent/Modified Independent  Communication  Communication No difficulties  Pain Assessment  Pain Assessment Faces  Faces Pain Scale 4  Pain Location throat  Pain Descriptors / Indicators Aching;Burning  Pain Intervention(s) Limited activity within patient's tolerance;Monitored during session;Repositioned  Cognition  Arousal/Alertness Awake/alert  Behavior During Therapy Agitated;Restless;Impulsive  Overall Cognitive Status No family/caregiver present to determine baseline cognitive functioning  General Comments most likely at baseline  Upper Extremity Assessment  Upper Extremity Assessment Defer to OT evaluation  Lower Extremity Assessment  Lower Extremity Assessment Generalized weakness  Cervical / Trunk Assessment  Cervical / Trunk Assessment Kyphotic  Bed Mobility  Overal bed mobility Modified Independent  Transfers  Overall transfer level  Needs assistance  Transfers Sit to/from Stand  Sit to Stand Supervision  General transfer comment Close supervision. Pt would not allow PT to assist with mobility as he stated hands were too cold. Required min A when standing at toiled as pt tended to shift too far anteriorly  Ambulation/Gait  Ambulation/Gait assistance Supervision  Gait Distance (Feet) 35 Feet  Assistive device Rolling walker (2 wheels)  Gait Pattern/deviations Step-through pattern;Decreased stride length;Trunk flexed  General Gait Details Close supervision for safety using RW. Pt would not allow PT to assist.  Gait velocity Decreased  Balance  Overall balance assessment Needs assistance  Sitting-balance support No upper extremity supported  Sitting balance-Leahy Scale Fair  Standing balance support Bilateral upper extremity supported  Standing balance-Leahy Scale Poor  Standing balance comment Reliant on BUE support  General Comments  General comments (skin integrity, edema, etc.) incontinent of urine; cussing at therapists  PT - End of Session  Equipment Utilized During Treatment Gait belt  Activity Tolerance Treatment limited secondary to agitation  Patient left in bed;with call bell/phone within reach;with bed alarm set  Nurse Communication Mobility status  PT Assessment  PT Recommendation/Assessment Patient needs continued PT services  PT Visit Diagnosis Unsteadiness on feet (R26.81);Muscle weakness (generalized) (M62.81)  PT Problem List Decreased activity tolerance;Decreased strength;Decreased balance;Decreased mobility;Decreased cognition;Decreased safety awareness;Decreased knowledge of precautions  PT Plan  PT Frequency (ACUTE ONLY) Min 3X/week  PT Treatment/Interventions (ACUTE ONLY) DME instruction;Gait training;Stair training;Therapeutic activities;Functional mobility training;Therapeutic exercise;Balance training;Patient/family education  AM-PAC PT "6 Clicks" Mobility Outcome Measure (Version 2)  Help  needed turning from your back to your side while in a flat bed without using bedrails? 4  Help needed moving from lying on your back to sitting on the side of a flat bed without using bedrails? 4  Help needed moving to and from a  bed to a chair (including a wheelchair)? 3  Help needed standing up from a chair using your arms (e.g., wheelchair or bedside chair)? 3  Help needed to walk in hospital room? 3  Help needed climbing 3-5 steps with a railing?  3  6 Click Score 20  Consider Recommendation of Discharge To: Home with no services  Progressive Mobility  What is the highest level of mobility based on the progressive mobility assessment? Level 5 (Walks with assist in room/hall) - Balance while stepping forward/back and can walk in room with assist - Complete  Mobility Out of bed for toileting;Out of bed to chair with meals  PT Recommendation  Follow Up Recommendations Home health PT  Assistance recommended at discharge Intermittent Supervision/Assistance  Functional Status Assessment Patient has had a recent decline in their functional status and demonstrates the ability to make significant improvements in function in a reasonable and predictable amount of time.  PT equipment Rolling walker (2 wheels)  Individuals Consulted  Consulted and Agree with Results and Recommendations Patient  Acute Rehab PT Goals  Patient Stated Goal none stated  PT Goal Formulation With patient  Time For Goal Achievement 03/16/21  Potential to Achieve Goals Fair  PT Time Calculation  PT Start Time (ACUTE ONLY) 1400  PT Stop Time (ACUTE ONLY) 1413  PT Time Calculation (min) (ACUTE ONLY) 13 min  PT General Charges  $$ ACUTE PT VISIT 1 Visit  PT Evaluation  $PT Eval Moderate Complexity 1 Mod  Written Expression  Dominant Hand Right   Pt admitted secondary to problem above with deficits above. Agitated throughout and cussing at therapists at times. Close supervision for safety using RW. Educated about using  RW at home. Recommending HHPT, but pt will likely refuse. Will continue to follow acutely.   Reuel Derby, PT, DPT  Acute Rehabilitation Services  Pager: [phone] Office: [phone]

## 2021-03-02 NOTE — Discharge Summary (Signed)
Physician Discharge Summary  [name] LYY:[id number] DOB: Jul 14, 1955 DOA: 03/01/2021  PCP: Patient, No Pcp Per (Inactive)  Admit date: 03/01/2021 Discharge date: 03/02/2021 Admitted From: Home Disposition: Home Recommendations for Outpatient Follow-up:  Follow ups as below. Please obtain CBC/BMP/Mag at follow up Please follow up on the following pending results: None Home Health: PT/OT ordered but patient refused. Equipment/Devices: Rolling walker ordered but patient refused. Discharge Condition: Stable CODE STATUS: DNR/DNI  Follow-up Information     Jerline Pain, MD. Schedule an appointment as soon as possible for a visit in 2 week(s).   Specialty: Cardiology Contact information: [address] [address] [phone]                Hospital Course: 65 year old M with PMH of CAD/CABG in 2010, prior ischemic CVA with residual LUE weakness, non-Hodgkin's lymphoma, chronic systolic CHF/ICM, polysubstance use including cocaine, marijuana and tobacco presenting with brief episode of LOC while sitting in the chair attending self-help for former addiction class.  LOC lasted about 10 to 15 seconds and resolved.  Reportedly had new LLE weakness but no seizure-like activity or postictal symptoms.   In ED, vital signs within normal.  Labs without significant finding.  CT head, MRI brain, CTA head and neck and CTA chest/abdomen/pelvis without significant finding to explain patient's symptoms.  However, CTA head redemonstrated occlusion of right M1 with reconstitution and perfusion of distal right MCA, moderate narrowing of left M1 and severe stenosis in bilateral cavernous segments not significantly changed from the prior exam.  Echocardiogram basically unchanged from prior.  Patient's UDS positive for cocaine and marijuana although patient denies using cocaine.  LDL 137.  A1c 5.5%.   Patient had no event on telemetry.  Evaluated by therapy and home  health and DME were recommended although patient refused.    See individual problem list below for more on hospital course.  Discharge Diagnoses:  Transient LOC-likely due to cocaine.  UDS positive for cocaine and marijuana but patient denies using cocaine.  CVA work-up unrevealing. -Advised to stop cocaine -Provided with resources by TOC.  Chronic systolic CHF: Appears euvolemic.  Not on diuretics.  History of CVA with residual LUE weakness -Continue statin and aspirin.  Physical deconditioning -HH and DME ordered but patient declined.  History of CAD/CABG: Stable.  No anginal symptoms. -Advised to avoid cocaine.  History of non-Hodgkin's lymphoma -Outpatient follow-up with oncology.  Polysubstance use including cocaine, marijuana and tobacco -Cessation counseling given.   There is no height or weight on file to calculate BMI.           Discharge Exam: Vitals:   03/02/21 0905 03/02/21 1220 03/02/21 1528 03/02/21 1634  BP: (!) 91/58 112/67    Pulse: 83 78    Temp:  99.2 F (37.3 C) (!) 101.5 F (38.6 C) 98.6 F (37 C)  Resp: 16 18 20    SpO2: 96% 99% 97%   TempSrc:  Oral Oral Oral     GENERAL: No apparent distress.  Nontoxic. HEENT: MMM.  Vision and hearing grossly intact.  NECK: Supple.  No apparent JVD.  RESP: 97% on RA.  No IWOB.  Fair aeration bilaterally. CVS:  RRR. Heart sounds normal.  ABD/GI/GU: Bowel sounds present. Soft. Non tender.  MSK/EXT:  Moves extremities. No apparent deformity. No edema.  SKIN: no apparent skin lesion or wound NEURO: Awake and alert.  Oriented appropriately.  No apparent focal neurodeficit other than LUE and LLE weakness. PSYCH: Easily  upset and not cooperative.  Discharge Instructions  Discharge Instructions     Diet - low sodium heart healthy   Complete by: As directed    Discharge instructions   Complete by: As directed    It has been a pleasure taking care of you!  You were hospitalized with brief loss of  consciousness and left leg weakness.  The test is we have done including CT of your head, MRI of your brain and echocardiogram did not reveal anything acute to explain his symptoms.  However, your urine test was positive for cocaine and marijuana which might have contributed.  We strongly recommend you avoid cocaine and marijuana.  Please take your medications as prescribed.  Follow-up with your primary care doctor in 1 to 2 weeks or sooner if needed.   Take care,   Increase activity slowly   Complete by: As directed       Allergies as of 03/02/2021   No Known Allergies      Medication List     TAKE these medications    aspirin EC 81 MG tablet Take 1 tablet (81 mg total) by mouth daily.   rosuvastatin 20 MG tablet Commonly known as: CRESTOR Take 1 tablet (20 mg total) by mouth daily.               Durable Medical Equipment  (From admission, onward)           Start     Ordered   03/02/21 1737  For home use only DME Walker rolling  Once       Question Answer Comment  Walker: With 5 Inch Wheels   Patient needs a walker to treat with the following condition Generalized weakness      03/02/21 1736   03/02/21 1737  For home use only DME Bedside commode  Once       Question:  Patient needs a bedside commode to treat with the following condition  Answer:  Generalized weakness   03/02/21 1736            Consultations: Neurology  Procedures/Studies: None   CT ANGIO HEAD NECK W WO CM  Result Date: 03/01/2021 CLINICAL DATA:  Neuro deficit, acute, stroke suspected more EXAM: CT ANGIOGRAPHY HEAD AND NECK TECHNIQUE: Multidetector CT imaging of the head and neck was performed using the standard protocol during bolus administration of intravenous contrast. Multiplanar CT image reconstructions and MIPs were obtained to evaluate the vascular anatomy. Carotid stenosis measurements (when applicable) are obtained utilizing NASCET criteria, using the distal internal  carotid diameter as the denominator. CONTRAST:  167mL OMNIPAQUE IOHEXOL 350 MG/ML SOLN COMPARISON:  03/05/2019, correlation is made with MRI 03/04/2019. FINDINGS: CT HEAD FINDINGS Brain: No acute infarct, hemorrhage, mass, mass effect, or midline shift. No hydrocephalus or extra-axial collection. Focal encephalomalacia in the right corona radiata, which correlates with the prior MRI. Vascular: No hyperdense vessel.  Please see CTA findings below. Skull: No acute osseous abnormality. Sinuses: Imaged portions are clear. Orbits: No acute finding. Review of the MIP images confirms the above findings CTA NECK FINDINGS Aortic arch: Standard branching. Imaged portion shows no evidence of aneurysm or dissection. No significant stenosis of the major arch vessel origins. Right carotid system: No evidence of dissection, stenosis (50% or greater) or occlusion. Left carotid system: No evidence of dissection, stenosis (50% or greater) or occlusion. Vertebral arteries: Codominant. No evidence of dissection, stenosis (50% or greater) or occlusion. Skeleton: No acute osseous abnormality. Degenerative changes in the  cervical spine. Status post median sternotomy. Other neck: Negative. Upper chest: For findings in the thorax, please see same day CT chest. Review of the MIP images confirms the above findings CTA HEAD FINDINGS Anterior circulation: Redemonstrated severe stenosis in the bilateral cavernous segment, not significantly changed from the prior exam. A1 segments patent. Normal anterior communicating artery. Anterior cerebral arteries are patent to their distal aspects. Redemonstrated right M1 occlusion, with reconstitution, unchanged. Moderate narrowing of the left M1, which has progressed slightly since the prior exam. Distal MCA branches perfused, although there is decreased perfusion in the right compared to the left. Posterior circulation: Mild irregularity in the right V4 segment, which is new compared to the prior exam.  Vertebral arteries are otherwise patent to the vertebrobasilar junction without stenosis. Posterior inferior cerebral arteries patent bilaterally. Basilar patent to its distal aspect. Superior cerebellar arteries patent bilaterally. PCAs perfused to their distal aspects without stenosis. The bilateral posterior communicating arteries are not visualized. Venous sinuses: As permitted by contrast timing, patent. Anatomic variants: None significant. Review of the MIP images confirms the above findings IMPRESSION: 1. Redemonstrated occlusion of the right M1, with reconstitution. Distal right MCA branches are perfused, although less so than on the left. 2. Moderate narrowing of the left M1, which has progressed slightly since the prior exam. 3. 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 4. No hemodynamically significant stenosis in the neck. Electronically Signed   By: Merilyn Baba M.D.   On: 03/01/2021 18:12   MR BRAIN WO CONTRAST  Result Date: 03/01/2021 CLINICAL DATA:  Neuro deficit, stroke suspected EXAM: MRI HEAD WITHOUT CONTRAST TECHNIQUE: Multiplanar, multiecho pulse sequences of the brain and surrounding structures were obtained without intravenous contrast. COMPARISON:  03/01/2021 CTA, correlation is also made with MRI 03/04/2019 FINDINGS: Brain: No restricted diffusion to suggest acute or subacute infarct. No acute hemorrhage, mass, mass effect, or midline shift. Remote infarcts in the right corona radiata, which correlate to acute infarcts seen on the 03/04/2019 exam. No hydrocephalus or extra-axial collection. Vascular: Normal flow voids. Skull and upper cervical spine: Normal marrow signal. Sinuses/Orbits: Negative. Other: The mastoids are well aerated. IMPRESSION: No acute intracranial process. Electronically Signed   By: Merilyn Baba M.D.   On: 03/01/2021 19:02   DG Chest Port 1 View  Result Date: 03/01/2021 CLINICAL DATA:   Chest pain. EXAM: PORTABLE CHEST 1 VIEW COMPARISON:  CT chest dated May 17, 2020. Chest x-ray dated May 08, 2019. FINDINGS: Unchanged right chest wall port catheter with tip in the proximal right atrium. The heart size and mediastinal contours are within normal limits. Prior CABG. Mildly increased interstitial opacities at the lung bases. Chronic scarring in the medial right middle lobe. No focal consolidation, pleural effusion, or pneumothorax. No acute osseous abnormality. IMPRESSION: 1. Mildly increased interstitial opacities at the lung bases could reflect edema or atypical infection. 2. Appropriately positioned right chest wall port catheter. Electronically Signed   By: Titus Dubin M.D.   On: 03/01/2021 16:05   ECHOCARDIOGRAM COMPLETE  Result Date: 03/02/2021    ECHOCARDIOGRAM REPORT   Patient Name:   [name] Date of Exam: 03/02/2021 Medical Rec #:  [id number]       Height:       67.0 in Accession #:    [id number]      Weight:       117.4 lb Date of Birth:  [birth year]       BSA:  1.613 m Patient Age:    39 years        BP:           106/66 mmHg Patient Gender: M               HR:           82 bpm. Exam Location:  Inpatient Procedure: 2D Echo, Cardiac Doppler, Color Doppler and Strain Analysis Indications:    R55 Syncope  History:        Patient has prior history of Echocardiogram examinations, most                 recent 09/05/2019. Idiopathic CMP, CAD, Stroke; Risk                 Factors:Current Smoker.  Sonographer:    Bernadene Person RDCS Referring Phys: [id number] [place]  1. Akinesis of the entire apex with overall mild to moderate LV dysfunction.  2. Left ventricular ejection fraction, by estimation, is 40 to 45%. The left ventricle has mild to moderately decreased function. The left ventricle demonstrates regional wall motion abnormalities (see scoring diagram/findings for description). Left ventricular diastolic parameters are consistent with Grade I  diastolic dysfunction (impaired relaxation). The average left ventricular global longitudinal strain is -9.1 %. The global longitudinal strain is abnormal.  3. Right ventricular systolic function is normal. The right ventricular size is normal.  4. The mitral valve is normal in structure. Trivial mitral valve regurgitation. No evidence of mitral stenosis.  5. The aortic valve is tricuspid. Aortic valve regurgitation is not visualized. No aortic stenosis is present.  6. Aortic dilatation noted. There is borderline dilatation of the aortic root, measuring 38 mm.  7. The inferior vena cava is normal in size with greater than 50% respiratory variability, suggesting right atrial pressure of 3 mmHg. FINDINGS  Left Ventricle: Left ventricular ejection fraction, by estimation, is 40 to 45%. The left ventricle has mild to moderately decreased function. The left ventricle demonstrates regional wall motion abnormalities. The average left ventricular global longitudinal strain is -9.1 %. The global longitudinal strain is abnormal. The left ventricular internal cavity size was normal in size. There is no left ventricular hypertrophy. Left ventricular diastolic parameters are consistent with Grade I diastolic  dysfunction (impaired relaxation). Right Ventricle: The right ventricular size is normal. Right ventricular systolic function is normal. Left Atrium: Left atrial size was normal in size. Right Atrium: Right atrial size was normal in size. Pericardium: There is no evidence of pericardial effusion. Mitral Valve: The mitral valve is normal in structure. Trivial mitral valve regurgitation. No evidence of mitral valve stenosis. Tricuspid Valve: The tricuspid valve is normal in structure. Tricuspid valve regurgitation is trivial. No evidence of tricuspid stenosis. Aortic Valve: The aortic valve is tricuspid. Aortic valve regurgitation is not visualized. No aortic stenosis is present. Pulmonic Valve: The pulmonic valve was normal  in structure. Pulmonic valve regurgitation is not visualized. No evidence of pulmonic stenosis. Aorta: Aortic dilatation noted. There is borderline dilatation of the aortic root, measuring 38 mm. Venous: The inferior vena cava is normal in size with greater than 50% respiratory variability, suggesting right atrial pressure of 3 mmHg. IAS/Shunts: No atrial level shunt detected by color flow Doppler. Additional Comments: Akinesis of the entire apex with overall mild to moderate LV dysfunction.  LEFT VENTRICLE PLAX 2D LVIDd:         5.00 cm      Diastology LVIDs:  3.90 cm      LV e' medial:    8.59 cm/s LV PW:         0.90 cm      LV E/e' medial:  6.3 LV IVS:        1.00 cm      LV e' lateral:   9.79 cm/s LVOT diam:     2.10 cm      LV E/e' lateral: 5.6 LV SV:         50 LV SV Index:   31           2D Longitudinal Strain LVOT Area:     3.46 cm     2D Strain GLS Avg:     -9.1 %  LV Volumes (MOD) LV vol d, MOD A2C: 106.0 ml LV vol d, MOD A4C: 121.0 ml LV vol s, MOD A2C: 61.6 ml LV vol s, MOD A4C: 68.6 ml LV SV MOD A2C:     44.4 ml LV SV MOD A4C:     121.0 ml LV SV MOD BP:      47.8 ml RIGHT VENTRICLE RV S prime:     10.10 cm/s TAPSE (M-mode): 1.8 cm LEFT ATRIUM           Index        RIGHT ATRIUM           Index LA diam:      3.80 cm 2.36 cm/m   RA Area:     12.40 cm LA Vol (A2C): 32.8 ml 20.34 ml/m  RA Volume:   30.10 ml  18.66 ml/m LA Vol (A4C): 41.6 ml 25.79 ml/m  AORTIC VALVE LVOT Vmax:   85.30 cm/s LVOT Vmean:  55.300 cm/s LVOT VTI:    0.143 m  AORTA Ao Root diam: 3.80 cm Ao Asc diam:  4.20 cm MITRAL VALVE MV Area (PHT): 4.71 cm    SHUNTS MV Decel Time: 161 msec    Systemic VTI:  0.14 m MV E velocity: 54.40 cm/s  Systemic Diam: 2.10 cm MV A velocity: 63.40 cm/s MV E/A ratio:  0.86 Kirk Ruths MD Electronically signed by Kirk Ruths MD Signature Date/Time: 03/02/2021/1:25:06 PM    Final    CT Angio Chest/Abd/Pel for Dissection W and/or W/WO  Result Date: 03/01/2021 CLINICAL DATA:  Chest and  back pain.  Concern for aortic dissection. EXAM: CT ANGIOGRAPHY CHEST, ABDOMEN AND PELVIS TECHNIQUE: Multidetector CT imaging through the chest, abdomen and pelvis was performed using the standard protocol during bolus administration of intravenous contrast. Multiplanar reconstructed images and MIPs were obtained and reviewed to evaluate the vascular anatomy. CONTRAST:  149mL OMNIPAQUE IOHEXOL 350 MG/ML SOLN COMPARISON:  Chest radiograph dated 03/01/2021. CT abdomen pelvis dated 05/17/2020. FINDINGS: Evaluation is limited due to streak artifact caused by patient's arms. CTA CHEST FINDINGS Cardiovascular: There is no cardiomegaly or pericardial effusion. Advanced 3 vessel coronary vascular calcification and postsurgical changes of CABG. The thoracic aorta is unremarkable. The origins of the great vessels of the aortic arch appear patent as visualized. The central pulmonary arteries are unremarkable for the degree of opacification. Right-sided Port-A-Cath with tip at the cavoatrial junction. Mediastinum/Nodes: No hilar or mediastinal adenopathy. The esophagus is grossly unremarkable. No mediastinal fluid collection. Lungs/Pleura: Minimal bibasilar dependent atelectasis. There is a 6 mm left upper lobe nodule (37/10). No focal consolidation, pleural effusion, pneumothorax. The central airways are patent. Musculoskeletal: Median sternotomy wires. Degenerative changes of the spine. No acute osseous pathology. Review of the MIP  images confirms the above findings. CTA ABDOMEN AND PELVIS FINDINGS VASCULAR Aorta: Normal caliber aorta without aneurysm, dissection, vasculitis or significant stenosis. Celiac: Patent without evidence of aneurysm, dissection, vasculitis or significant stenosis. SMA: Patent without evidence of aneurysm, dissection, vasculitis or significant stenosis. Renals: Both renal arteries are patent without evidence of aneurysm, dissection, vasculitis, fibromuscular dysplasia or significant stenosis. IMA:  Patent without evidence of aneurysm, dissection, vasculitis or significant stenosis. Inflow: Mild atherosclerotic calcification. The iliac arteries are patent. No aneurysm or dissection. Veins: Suboptimally evaluated but grossly unremarkable. Review of the MIP images confirms the above findings. NON-VASCULAR No intra-abdominal free air or free fluid. Hepatobiliary: The liver is unremarkable.  No calcified gallstone. Pancreas: Unremarkable. No pancreatic ductal dilatation or surrounding inflammatory changes. Spleen: Indeterminate areas of hypodensity within the spleen similar to the CT of 05/17/2020, likely cysts. Adrenals/Urinary Tract: The adrenal glands are poorly visualized. There is no hydronephrosis on either side. There is symmetric enhancement and excretion of contrast by both kidneys. The visualized ureters appear unremarkable. Irregular posterior bladder wall with bladder diverticula. Stomach/Bowel: Evaluation of the bowel is limited in the absence of oral contrast and paucity of abdominal fat. No evidence of bowel obstruction. Lymphatic: No adenopathy. Reproductive: Mildly enlarged prostate gland. Other: Loss of subcutaneous fat and cachexia. Musculoskeletal: Degenerative changes of the spine. Old healed fracture of the right femoral neck. No acute osseous pathology. Review of the MIP images confirms the above findings. IMPRESSION: 1. No acute intrathoracic, abdominal, or pelvic pathology. No aortic aneurysm or dissection. 2. A 6 mm left upper lobe nodule. 3. Aortic Atherosclerosis (ICD10-I70.0). Electronically Signed   By: Anner Crete M.D.   On: 03/01/2021 17:49       The results of significant diagnostics from this hospitalization (including imaging, microbiology, ancillary and laboratory) are listed below for reference.     Microbiology: Recent Results (from the past 240 hour(s))  Resp Panel by RT-PCR (Flu A&B, Covid) Nasopharyngeal Swab     Status: None   Collection Time: 03/01/21  2:44  PM   Specimen: Nasopharyngeal Swab; Nasopharyngeal(NP) swabs in vial transport medium  Result Value Ref Range Status   SARS Coronavirus 2 by RT PCR NEGATIVE NEGATIVE Final    Comment: (NOTE) SARS-CoV-2 target nucleic acids are NOT DETECTED.  The SARS-CoV-2 RNA is generally detectable in upper respiratory specimens during the acute phase of infection. The lowest concentration of SARS-CoV-2 viral copies this assay can detect is 138 copies/mL. A negative result does not preclude SARS-Cov-2 infection and should not be used as the sole basis for treatment or other patient management decisions. A negative result may occur with  improper specimen collection/handling, submission of specimen other than nasopharyngeal swab, presence of viral mutation(s) within the areas targeted by this assay, and inadequate number of viral copies(<138 copies/mL). A negative result must be combined with clinical observations, patient history, and epidemiological information. The expected result is Negative.  Fact Sheet for Patients:  EntrepreneurPulse.com.au  Fact Sheet for Healthcare Providers:  IncredibleEmployment.be  This test is no t yet approved or cleared by the [place] FDA and  has been authorized for detection and/or diagnosis of SARS-CoV-2 by FDA under an Emergency Use Authorization (EUA). This EUA will remain  in effect (meaning this test can be used) for the duration of the COVID-19 declaration under Section 564(b)(1) of the Act, 21 U.S.C.section 360bbb-3(b)(1), unless the authorization is terminated  or revoked sooner.       Influenza A by PCR NEGATIVE NEGATIVE Final  Influenza B by PCR NEGATIVE NEGATIVE Final    Comment: (NOTE) The Xpert Xpress SARS-CoV-2/FLU/RSV plus assay is intended as an aid in the diagnosis of influenza from Nasopharyngeal swab specimens and should not be used as a sole basis for treatment. Nasal washings and aspirates are  unacceptable for Xpert Xpress SARS-CoV-2/FLU/RSV testing.  Fact Sheet for Patients: EntrepreneurPulse.com.au  Fact Sheet for Healthcare Providers: IncredibleEmployment.be  This test is not yet approved or cleared by the [place] FDA and has been authorized for detection and/or diagnosis of SARS-CoV-2 by FDA under an Emergency Use Authorization (EUA). This EUA will remain in effect (meaning this test can be used) for the duration of the COVID-19 declaration under Section 564(b)(1) of the Act, 21 U.S.C. section 360bbb-3(b)(1), unless the authorization is terminated or revoked.  Performed at [facility], [place] [address], [place], [place] [address]      Labs:  CBC: Recent Labs  Lab 03/01/21 1451 03/01/21 1521 03/02/21 0354  WBC 4.9  --  5.3  NEUTROABS 4.1  --   --   HGB 13.4 14.3 13.4  HCT 41.5 42.0 39.5  MCV 99.8  --  97.5  PLT 168  --  156   BMP &GFR Recent Labs  Lab 03/01/21 1451 03/01/21 1521 03/02/21 0354  NA 138 142 136  K 3.5 3.5 4.2  CL 105 104 105  CO2 26  --  23  GLUCOSE 74 67* 94  BUN 10 10 8   CREATININE 0.96 0.90 0.93  CALCIUM 8.8*  --  8.6*  MG  --   --  2.0  2.0   CrCl cannot be calculated (Unknown ideal weight.). Liver & Pancreas: Recent Labs  Lab 03/01/21 1451 03/02/21 0354  AST 17 17  ALT 12 15  ALKPHOS 65 62  BILITOT 0.9 0.8  PROT 6.4* 6.0*  ALBUMIN 3.6 3.3*   No results for input(s): LIPASE, AMYLASE in the last 168 hours. No results for input(s): AMMONIA in the last 168 hours. Diabetic: Recent Labs    03/02/21 0354  HGBA1C 5.5   Recent Labs  Lab 03/01/21 1614 03/01/21 1824  GLUCAP 73 81   Cardiac Enzymes: No results for input(s): CKTOTAL, CKMB, CKMBINDEX, TROPONINI in the last 168 hours. No results for input(s): PROBNP in the last 8760 hours. Coagulation Profile: Recent Labs  Lab 03/01/21 1451  INR 1.0   Thyroid Function Tests: No results for input(s): TSH, T4TOTAL,  FREET4, T3FREE, THYROIDAB in the last 72 hours. Lipid Profile: Recent Labs    03/02/21 0354  CHOL 206*  HDL 57  LDLCALC 137*  TRIG 59  CHOLHDL 3.6   Anemia Panel: No results for input(s): VITAMINB12, FOLATE, FERRITIN, TIBC, IRON, RETICCTPCT in the last 72 hours. Urine analysis:    Component Value Date/Time   COLORURINE YELLOW 03/01/2021 1742   APPEARANCEUR CLEAR 03/01/2021 1742   LABSPEC <1.005 (L) 03/01/2021 1742   PHURINE 7.0 03/01/2021 1742   GLUCOSEU NEGATIVE 03/01/2021 1742   HGBUR NEGATIVE 03/01/2021 1742   BILIRUBINUR NEGATIVE 03/01/2021 1742   KETONESUR NEGATIVE 03/01/2021 1742   PROTEINUR NEGATIVE 03/01/2021 1742   UROBILINOGEN 1.0 04/30/2008 0423   NITRITE NEGATIVE 03/01/2021 1742   LEUKOCYTESUR NEGATIVE 03/01/2021 1742   Sepsis Labs: Invalid input(s): PROCALCITONIN, LACTICIDVEN   Time coordinating discharge: 45 minutes  SIGNED:  Mercy Riding, MD  [facility] 03/02/2021, 5:37 PM

## 2021-03-02 NOTE — ED Notes (Signed)
ED TO INPATIENT HANDOFF REPORT  ED Nurse Name and Phone # Teodoro Spray [phone]  S Name/Age/Gender [name] 65 y.o. male Room/Bed: 001C/001C  Code Status   Code Status: DNR  Home/SNF/Other Home Patient oriented to: self, place, and time Is this baseline? Yes   Triage Complete: Triage complete  Chief Complaint Syncope [R55]  Triage Note Patient arrived by [facility] [place] with complaint of chest pain and back pain. On arrival patient asking to lay down and appears restless. During EKG patient states that I think I have the flu. Unable to report any medical hx   Allergies No Known Allergies  Level of Care/Admitting Diagnosis ED Disposition     ED Disposition  Admit   Condition  --   Comment  Hospital Area: [place] [[id number]]  Level of Care: Telemetry Medical [104]  May place patient in observation at [facility] or [facility] if equivalent level of care is available:: No  Covid Evaluation: Confirmed COVID Negative  Diagnosis: Syncope [206001]  Admitting Physician: Rhetta Mura [[id number]]  Attending Physician: Rhetta Mura [[id number]]          B Medical/Surgery History Past Medical History:  Diagnosis Date   CAD (coronary artery disease)    a. 04/2008 Cath: LM nl, LAD 50p, 36m, LCX min irregs, RCA 50p/m, 40d, RPDA 60-70ost; b. 04/2008 CABG x 4: LIMA->LAD, VG->D2, VG->RPDA->RPL.   Cancer ([place])    Cocaine abuse ([place])    a. Quit early 2019.   Ischemic cardiomyopathy    a. LV gram: EF 30-35%, apical/periapical AK.   Stroke ([place]) 02/2019   Stroke ([place]) 02/2019   Tobacco abuse    Past Surgical History:  Procedure Laterality Date   BYPASS GRAFT  2010   CARDIAC SURGERY  2010   IR IMAGING GUIDED PORT INSERTION  05/26/2019   SUPRACLAVICAL NODE BIOPSY Left 05/08/2019   Procedure: SUPRACLAVICAL NODE BIOPSY;  Surgeon: [name], MD;  Location: [facility];  Service: Thoracic;  Laterality: Left;     A IV  Location/Drains/Wounds Patient Lines/Drains/Airways Status     Active Line/Drains/Airways     Name Placement date Placement time Site Days   Implanted Port 05/26/19 Right Chest 05/26/19  1156  Chest  646   Peripheral IV 03/02/21 Anterior;Proximal;Right Forearm 03/02/21  0300  Forearm  less than 1   Incision (Closed) 03/26/19 Groin Left 03/26/19  1151  -- 707   Incision (Closed) 05/08/19 Chest Left 05/08/19  0838  -- 664            Intake/Output Last 24 hours No intake or output data in the 24 hours ending 03/02/21 1047  Labs/Imaging Results for orders placed or performed during the hospital encounter of 03/01/21 (from the past 48 hour(s))  Resp Panel by RT-PCR (Flu A&B, Covid) Nasopharyngeal Swab     Status: None   Collection Time: 03/01/21  2:44 PM   Specimen: Nasopharyngeal Swab; Nasopharyngeal(NP) swabs in vial transport medium  Result Value Ref Range   SARS Coronavirus 2 by RT PCR NEGATIVE NEGATIVE    Comment: (NOTE) SARS-CoV-2 target nucleic acids are NOT DETECTED.  The SARS-CoV-2 RNA is generally detectable in upper respiratory specimens during the acute phase of infection. The lowest concentration of SARS-CoV-2 viral copies this assay can detect is 138 copies/mL. A negative result does not preclude SARS-Cov-2 infection and should not be used as the sole basis for treatment or other patient management decisions. A negative result may  occur with  improper specimen collection/handling, submission of specimen other than nasopharyngeal swab, presence of viral mutation(s) within the areas targeted by this assay, and inadequate number of viral copies(<138 copies/mL). A negative result must be combined with clinical observations, patient history, and epidemiological information. The expected result is Negative.  Fact Sheet for Patients:  EntrepreneurPulse.com.au  Fact Sheet for Healthcare Providers:  IncredibleEmployment.be  This test  is no t yet approved or cleared by the [place] FDA and  has been authorized for detection and/or diagnosis of SARS-CoV-2 by FDA under an Emergency Use Authorization (EUA). This EUA will remain  in effect (meaning this test can be used) for the duration of the COVID-19 declaration under Section 564(b)(1) of the Act, 21 U.S.C.section 360bbb-3(b)(1), unless the authorization is terminated  or revoked sooner.       Influenza A by PCR NEGATIVE NEGATIVE   Influenza B by PCR NEGATIVE NEGATIVE    Comment: (NOTE) The Xpert Xpress SARS-CoV-2/FLU/RSV plus assay is intended as an aid in the diagnosis of influenza from Nasopharyngeal swab specimens and should not be used as a sole basis for treatment. Nasal washings and aspirates are unacceptable for Xpert Xpress SARS-CoV-2/FLU/RSV testing.  Fact Sheet for Patients: EntrepreneurPulse.com.au  Fact Sheet for Healthcare Providers: IncredibleEmployment.be  This test is not yet approved or cleared by the [place] FDA and has been authorized for detection and/or diagnosis of SARS-CoV-2 by FDA under an Emergency Use Authorization (EUA). This EUA will remain in effect (meaning this test can be used) for the duration of the COVID-19 declaration under Section 564(b)(1) of the Act, 21 U.S.C. section 360bbb-3(b)(1), unless the authorization is terminated or revoked.  Performed at [facility], [place] [address], [place], [place] [address]   Comprehensive metabolic panel     Status: Abnormal   Collection Time: 03/01/21  2:51 PM  Result Value Ref Range   Sodium 138 135 - 145 mmol/L   Potassium 3.5 3.5 - 5.1 mmol/L   Chloride 105 98 - 111 mmol/L   CO2 26 22 - 32 mmol/L   Glucose, Bld 74 70 - 99 mg/dL    Comment: Glucose reference range applies only to samples taken after fasting for at least 8 hours.   BUN 10 8 - 23 mg/dL   Creatinine, Ser 0.96 0.61 - 1.24 mg/dL   Calcium 8.8 (L) 8.9 - 10.3 mg/dL    Total Protein 6.4 (L) 6.5 - 8.1 g/dL   Albumin 3.6 3.5 - 5.0 g/dL   AST 17 15 - 41 U/L   ALT 12 0 - 44 U/L   Alkaline Phosphatase 65 38 - 126 U/L   Total Bilirubin 0.9 0.3 - 1.2 mg/dL   GFR, Estimated >60 >60 mL/min    Comment: (NOTE) Calculated using the CKD-EPI Creatinine Equation (2021)    Anion gap 7 5 - 15    Comment: Performed at [facility], [place] [address], [place], [place] [address]  CBC with Differential     Status: Abnormal   Collection Time: 03/01/21  2:51 PM  Result Value Ref Range   WBC 4.9 4.0 - 10.5 K/uL   RBC 4.16 (L) 4.22 - 5.81 MIL/uL   Hemoglobin 13.4 13.0 - 17.0 g/dL   HCT 41.5 39.0 - 52.0 %   MCV 99.8 80.0 - 100.0 fL   MCH 32.2 26.0 - 34.0 pg   MCHC 32.3 30.0 - 36.0 g/dL   RDW 13.2 11.5 - 15.5 %   Platelets 168  150 - 400 K/uL   nRBC 0.0 0.0 - 0.2 %   Neutrophils Relative % 84 %   Neutro Abs 4.1 1.7 - 7.7 K/uL   Lymphocytes Relative 10 %   Lymphs Abs 0.5 (L) 0.7 - 4.0 K/uL   Monocytes Relative 5 %   Monocytes Absolute 0.2 0.1 - 1.0 K/uL   Eosinophils Relative 1 %   Eosinophils Absolute 0.0 0.0 - 0.5 K/uL   Basophils Relative 0 %   Basophils Absolute 0.0 0.0 - 0.1 K/uL   Immature Granulocytes 0 %   Abs Immature Granulocytes 0.00 0.00 - 0.07 K/uL    Comment: Performed at [facility] [address], [place], [place] [address]  Troponin I (High Sensitivity)     Status: None   Collection Time: 03/01/21  2:51 PM  Result Value Ref Range   Troponin I (High Sensitivity) 4 <18 ng/L    Comment: (NOTE) Elevated high sensitivity troponin I (hsTnI) values and significant  changes across serial measurements may suggest ACS but many other  chronic and acute conditions are known to elevate hsTnI results.  Refer to the "Links" section for chest pain algorithms and additional  guidance. Performed at [facility], [place] [address], [place], [place] [address]   Protime-INR     Status: None   Collection Time: 03/01/21  2:51 PM  Result Value Ref  Range   Prothrombin Time 13.2 11.4 - 15.2 seconds   INR 1.0 0.8 - 1.2    Comment: (NOTE) INR goal varies based on device and disease states. Performed at [facility], [place] [address], [place], [place] [address]   I-stat chem 8, ED     Status: Abnormal   Collection Time: 03/01/21  3:21 PM  Result Value Ref Range   Sodium 142 135 - 145 mmol/L   Potassium 3.5 3.5 - 5.1 mmol/L   Chloride 104 98 - 111 mmol/L   BUN 10 8 - 23 mg/dL   Creatinine, Ser 0.90 0.61 - 1.24 mg/dL   Glucose, Bld 67 (L) 70 - 99 mg/dL    Comment: Glucose reference range applies only to samples taken after fasting for at least 8 hours.   Calcium, Ion 1.19 1.15 - 1.40 mmol/L   TCO2 29 22 - 32 mmol/L   Hemoglobin 14.3 13.0 - 17.0 g/dL   HCT 42.0 39.0 - 52.0 %  CBG monitoring, ED     Status: None   Collection Time: 03/01/21  4:14 PM  Result Value Ref Range   Glucose-Capillary 73 70 - 99 mg/dL    Comment: Glucose reference range applies only to samples taken after fasting for at least 8 hours.  Troponin I (High Sensitivity)     Status: None   Collection Time: 03/01/21  5:20 PM  Result Value Ref Range   Troponin I (High Sensitivity) 5 <18 ng/L    Comment: (NOTE) Elevated high sensitivity troponin I (hsTnI) values and significant  changes across serial measurements may suggest ACS but many other  chronic and acute conditions are known to elevate hsTnI results.  Refer to the "Links" section for chest pain algorithms and additional  guidance. Performed at [facility], [place] [address] [place], [place] [address]   Urinalysis, Routine w reflex microscopic     Status: Abnormal   Collection Time: 03/01/21  5:42 PM  Result Value Ref Range   Color, Urine YELLOW YELLOW   APPearance CLEAR CLEAR   Specific Gravity, Urine <  1.005 (L) 1.005 - 1.030   pH 7.0 5.0 - 8.0   Glucose, UA NEGATIVE NEGATIVE mg/dL   Hgb urine dipstick NEGATIVE NEGATIVE   Bilirubin Urine NEGATIVE NEGATIVE   Ketones, ur NEGATIVE NEGATIVE  mg/dL   Protein, ur NEGATIVE NEGATIVE mg/dL   Nitrite NEGATIVE NEGATIVE   Leukocytes,Ua NEGATIVE NEGATIVE    Comment: Microscopic not done on urines with negative protein, blood, leukocytes, nitrite, or glucose < 500 mg/dL. Performed at [facility], [place] [address], [place], [place] [address]   Rapid urine drug screen (hospital performed)     Status: Abnormal   Collection Time: 03/01/21  5:42 PM  Result Value Ref Range   Opiates NONE DETECTED NONE DETECTED   Cocaine POSITIVE (A) NONE DETECTED   Benzodiazepines NONE DETECTED NONE DETECTED   Amphetamines NONE DETECTED NONE DETECTED   Tetrahydrocannabinol POSITIVE (A) NONE DETECTED   Barbiturates NONE DETECTED NONE DETECTED    Comment: (NOTE) DRUG SCREEN FOR MEDICAL PURPOSES ONLY.  IF CONFIRMATION IS NEEDED FOR ANY PURPOSE, NOTIFY LAB WITHIN 5 DAYS.  LOWEST DETECTABLE LIMITS FOR URINE DRUG SCREEN Drug Class                     Cutoff (ng/mL) Amphetamine and metabolites    1000 Barbiturate and metabolites    200 Benzodiazepine                 427 Tricyclics and metabolites     300 Opiates and metabolites        300 Cocaine and metabolites        300 THC                            50 Performed at [facility], [place] [address], [place], [place] [address]   POC CBG, ED     Status: None   Collection Time: 03/01/21  6:24 PM  Result Value Ref Range   Glucose-Capillary 81 70 - 99 mg/dL    Comment: Glucose reference range applies only to samples taken after fasting for at least 8 hours.  Hemoglobin A1c     Status: None   Collection Time: 03/02/21  3:54 AM  Result Value Ref Range   Hgb A1c MFr Bld 5.5 4.8 - 5.6 %    Comment: (NOTE) Pre diabetes:          5.7%-6.4%  Diabetes:              >6.4%  Glycemic control for   <7.0% adults with diabetes    Mean Plasma Glucose 111.15 mg/dL    Comment: Performed at [facility] [address], [place], [place] [address]  Lipid panel     Status: Abnormal   Collection  Time: 03/02/21  3:54 AM  Result Value Ref Range   Cholesterol 206 (H) 0 - 200 mg/dL   Triglycerides 59 <150 mg/dL   HDL 57 >40 mg/dL   Total CHOL/HDL Ratio 3.6 RATIO   VLDL 12 0 - 40 mg/dL   LDL Cholesterol 137 (H) 0 - 99 mg/dL    Comment:        Total Cholesterol/HDL:CHD Risk Coronary Heart Disease Risk Table                     Men   Women  1/2 Average Risk   3.4   3.3  Average Risk  5.0   4.4  2 X Average Risk   9.6   7.1  3 X Average Risk  23.4   11.0        Use the calculated Patient Ratio above and the CHD Risk Table to determine the patient's CHD Risk.        ATP III CLASSIFICATION (LDL):  <100     mg/dL   Optimal  100-129  mg/dL   Near or Above                    Optimal  130-159  mg/dL   Borderline  160-189  mg/dL   High  >190     mg/dL   Very High Performed at [facility] [address], [place], [place] [address]   Magnesium     Status: None   Collection Time: 03/02/21  3:54 AM  Result Value Ref Range   Magnesium 2.0 1.7 - 2.4 mg/dL    Comment: Performed at [facility] [address], [place], [place] [address]  Comprehensive metabolic panel     Status: Abnormal   Collection Time: 03/02/21  3:54 AM  Result Value Ref Range   Sodium 136 135 - 145 mmol/L   Potassium 4.2 3.5 - 5.1 mmol/L   Chloride 105 98 - 111 mmol/L   CO2 23 22 - 32 mmol/L   Glucose, Bld 94 70 - 99 mg/dL    Comment: Glucose reference range applies only to samples taken after fasting for at least 8 hours.   BUN 8 8 - 23 mg/dL   Creatinine, Ser 0.93 0.61 - 1.24 mg/dL   Calcium 8.6 (L) 8.9 - 10.3 mg/dL   Total Protein 6.0 (L) 6.5 - 8.1 g/dL   Albumin 3.3 (L) 3.5 - 5.0 g/dL   AST 17 15 - 41 U/L   ALT 15 0 - 44 U/L   Alkaline Phosphatase 62 38 - 126 U/L   Total Bilirubin 0.8 0.3 - 1.2 mg/dL   GFR, Estimated >60 >60 mL/min    Comment: (NOTE) Calculated using the CKD-EPI Creatinine Equation (2021)    Anion gap 8 5 - 15    Comment: Performed at [facility],  [place] [address] [place], [place] [address]  CBC     Status: Abnormal   Collection Time: 03/02/21  3:54 AM  Result Value Ref Range   WBC 5.3 4.0 - 10.5 K/uL   RBC 4.05 (L) 4.22 - 5.81 MIL/uL   Hemoglobin 13.4 13.0 - 17.0 g/dL   HCT 39.5 39.0 - 52.0 %   MCV 97.5 80.0 - 100.0 fL   MCH 33.1 26.0 - 34.0 pg   MCHC 33.9 30.0 - 36.0 g/dL   RDW 13.2 11.5 - 15.5 %   Platelets 156 150 - 400 K/uL   nRBC 0.0 0.0 - 0.2 %    Comment: Performed at [facility], [place] [address], [place], [place] [address]  Troponin I (High Sensitivity)     Status: None   Collection Time: 03/02/21  5:05 AM  Result Value Ref Range   Troponin I (High Sensitivity) 10 <18 ng/L    Comment: (NOTE) Elevated high sensitivity troponin I (hsTnI) values and significant  changes across serial measurements may suggest ACS but many other  chronic and acute conditions are known to elevate hsTnI results.  Refer to the "Links" section for chest pain algorithms and additional  guidance. Performed at [facility],  [address] [place], [place] [address]    CT ANGIO HEAD NECK W WO CM  Result Date: 03/01/2021 CLINICAL DATA:  Neuro deficit, acute, stroke suspected more EXAM: CT ANGIOGRAPHY HEAD AND NECK TECHNIQUE: Multidetector CT imaging of the head and neck was performed using the standard protocol during bolus administration of intravenous contrast. Multiplanar CT image reconstructions and MIPs were obtained to evaluate the vascular anatomy. Carotid stenosis measurements (when applicable) are obtained utilizing NASCET criteria, using the distal internal carotid diameter as the denominator. CONTRAST:  135mL OMNIPAQUE IOHEXOL 350 MG/ML SOLN COMPARISON:  03/05/2019, correlation is made with MRI 03/04/2019. FINDINGS: CT HEAD FINDINGS Brain: No acute infarct, hemorrhage, mass, mass effect, or midline shift. No hydrocephalus or extra-axial collection. Focal encephalomalacia in the right corona radiata, which correlates with the prior  MRI. Vascular: No hyperdense vessel.  Please see CTA findings below. Skull: No acute osseous abnormality. Sinuses: Imaged portions are clear. Orbits: No acute finding. Review of the MIP images confirms the above findings CTA NECK FINDINGS Aortic arch: Standard branching. Imaged portion shows no evidence of aneurysm or dissection. No significant stenosis of the major arch vessel origins. Right carotid system: No evidence of dissection, stenosis (50% or greater) or occlusion. Left carotid system: No evidence of dissection, stenosis (50% or greater) or occlusion. Vertebral arteries: Codominant. No evidence of dissection, stenosis (50% or greater) or occlusion. Skeleton: No acute osseous abnormality. Degenerative changes in the cervical spine. Status post median sternotomy. Other neck: Negative. Upper chest: For findings in the thorax, please see same day CT chest. Review of the MIP images confirms the above findings CTA HEAD FINDINGS Anterior circulation: Redemonstrated severe stenosis in the bilateral cavernous segment, not significantly changed from the prior exam. A1 segments patent. Normal anterior communicating artery. Anterior cerebral arteries are patent to their distal aspects. Redemonstrated right M1 occlusion, with reconstitution, unchanged. Moderate narrowing of the left M1, which has progressed slightly since the prior exam. Distal MCA branches perfused, although there is decreased perfusion in the right compared to the left. Posterior circulation: Mild irregularity in the right V4 segment, which is new compared to the prior exam. Vertebral arteries are otherwise patent to the vertebrobasilar junction without stenosis. Posterior inferior cerebral arteries patent bilaterally. Basilar patent to its distal aspect. Superior cerebellar arteries patent bilaterally. PCAs perfused to their distal aspects without stenosis. The bilateral posterior communicating arteries are not visualized. Venous sinuses: As  permitted by contrast timing, patent. Anatomic variants: None significant. Review of the MIP images confirms the above findings IMPRESSION: 1. Redemonstrated occlusion of the right M1, with reconstitution. Distal right MCA branches are perfused, although less so than on the left. 2. Moderate narrowing of the left M1, which has progressed slightly since the prior exam. 3. 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 4. No hemodynamically significant stenosis in the neck. Electronically Signed   By: Merilyn Baba M.D.   On: 03/01/2021 18:12   MR BRAIN WO CONTRAST  Result Date: 03/01/2021 CLINICAL DATA:  Neuro deficit, stroke suspected EXAM: MRI HEAD WITHOUT CONTRAST TECHNIQUE: Multiplanar, multiecho pulse sequences of the brain and surrounding structures were obtained without intravenous contrast. COMPARISON:  03/01/2021 CTA, correlation is also made with MRI 03/04/2019 FINDINGS: Brain: No restricted diffusion to suggest acute or subacute infarct. No acute hemorrhage, mass, mass effect, or midline shift. Remote infarcts in the right corona radiata, which correlate to acute infarcts seen on the 03/04/2019 exam. No hydrocephalus or extra-axial collection.  Vascular: Normal flow voids. Skull and upper cervical spine: Normal marrow signal. Sinuses/Orbits: Negative. Other: The mastoids are well aerated. IMPRESSION: No acute intracranial process. Electronically Signed   By: Merilyn Baba M.D.   On: 03/01/2021 19:02   DG Chest Port 1 View  Result Date: 03/01/2021 CLINICAL DATA:  Chest pain. EXAM: PORTABLE CHEST 1 VIEW COMPARISON:  CT chest dated May 17, 2020. Chest x-ray dated May 08, 2019. FINDINGS: Unchanged right chest wall port catheter with tip in the proximal right atrium. The heart size and mediastinal contours are within normal limits. Prior CABG. Mildly increased interstitial opacities at the lung bases. Chronic scarring in the  medial right middle lobe. No focal consolidation, pleural effusion, or pneumothorax. No acute osseous abnormality. IMPRESSION: 1. Mildly increased interstitial opacities at the lung bases could reflect edema or atypical infection. 2. Appropriately positioned right chest wall port catheter. Electronically Signed   By: Titus Dubin M.D.   On: 03/01/2021 16:05   CT Angio Chest/Abd/Pel for Dissection W and/or W/WO  Result Date: 03/01/2021 CLINICAL DATA:  Chest and back pain.  Concern for aortic dissection. EXAM: CT ANGIOGRAPHY CHEST, ABDOMEN AND PELVIS TECHNIQUE: Multidetector CT imaging through the chest, abdomen and pelvis was performed using the standard protocol during bolus administration of intravenous contrast. Multiplanar reconstructed images and MIPs were obtained and reviewed to evaluate the vascular anatomy. CONTRAST:  144mL OMNIPAQUE IOHEXOL 350 MG/ML SOLN COMPARISON:  Chest radiograph dated 03/01/2021. CT abdomen pelvis dated 05/17/2020. FINDINGS: Evaluation is limited due to streak artifact caused by patient's arms. CTA CHEST FINDINGS Cardiovascular: There is no cardiomegaly or pericardial effusion. Advanced 3 vessel coronary vascular calcification and postsurgical changes of CABG. The thoracic aorta is unremarkable. The origins of the great vessels of the aortic arch appear patent as visualized. The central pulmonary arteries are unremarkable for the degree of opacification. Right-sided Port-A-Cath with tip at the cavoatrial junction. Mediastinum/Nodes: No hilar or mediastinal adenopathy. The esophagus is grossly unremarkable. No mediastinal fluid collection. Lungs/Pleura: Minimal bibasilar dependent atelectasis. There is a 6 mm left upper lobe nodule (37/10). No focal consolidation, pleural effusion, pneumothorax. The central airways are patent. Musculoskeletal: Median sternotomy wires. Degenerative changes of the spine. No acute osseous pathology. Review of the MIP images confirms the above  findings. CTA ABDOMEN AND PELVIS FINDINGS VASCULAR Aorta: Normal caliber aorta without aneurysm, dissection, vasculitis or significant stenosis. Celiac: Patent without evidence of aneurysm, dissection, vasculitis or significant stenosis. SMA: Patent without evidence of aneurysm, dissection, vasculitis or significant stenosis. Renals: Both renal arteries are patent without evidence of aneurysm, dissection, vasculitis, fibromuscular dysplasia or significant stenosis. IMA: Patent without evidence of aneurysm, dissection, vasculitis or significant stenosis. Inflow: Mild atherosclerotic calcification. The iliac arteries are patent. No aneurysm or dissection. Veins: Suboptimally evaluated but grossly unremarkable. Review of the MIP images confirms the above findings. NON-VASCULAR No intra-abdominal free air or free fluid. Hepatobiliary: The liver is unremarkable.  No calcified gallstone. Pancreas: Unremarkable. No pancreatic ductal dilatation or surrounding inflammatory changes. Spleen: Indeterminate areas of hypodensity within the spleen similar to the CT of 05/17/2020, likely cysts. Adrenals/Urinary Tract: The adrenal glands are poorly visualized. There is no hydronephrosis on either side. There is symmetric enhancement and excretion of contrast by both kidneys. The visualized ureters appear unremarkable. Irregular posterior bladder wall with bladder diverticula. Stomach/Bowel: Evaluation of the bowel is limited in the absence of oral contrast and paucity of abdominal fat. No evidence of bowel obstruction. Lymphatic: No adenopathy. Reproductive: Mildly enlarged prostate gland. Other: Loss of  subcutaneous fat and cachexia. Musculoskeletal: Degenerative changes of the spine. Old healed fracture of the right femoral neck. No acute osseous pathology. Review of the MIP images confirms the above findings. IMPRESSION: 1. No acute intrathoracic, abdominal, or pelvic pathology. No aortic aneurysm or dissection. 2. A 6 mm left  upper lobe nodule. 3. Aortic Atherosclerosis (ICD10-I70.0). Electronically Signed   By: Anner Crete M.D.   On: 03/01/2021 17:49    Pending Labs Unresulted Labs (From admission, onward)     Start     Ordered   03/02/21 0500  HIV Antibody (routine testing w rflx)  (HIV Antibody (Routine testing w reflex) panel)  Tomorrow morning,   R        03/01/21 1939   03/01/21 2002  Brain natriuretic peptide  Add-on,   AD        03/01/21 2001   03/01/21 2002  Procalcitonin - Baseline  Add-on,   AD        03/01/21 2001   03/01/21 1944  Magnesium  Add-on,   AD       Question:  Specimen collection method  Answer:  Lab=Lab collect   03/01/21 1943            Vitals/Pain Today's Vitals   03/02/21 0500 03/02/21 0600 03/02/21 0800 03/02/21 0905  BP: (!) 97/57 (!) 103/57 107/70 (!) 91/58  Pulse: 83 81 86 83  Resp: 14 16  16   Temp:      TempSrc:      SpO2: 93% 95% 96% 96%  PainSc:        Isolation Precautions No active isolations  Medications Medications  dextrose 50 % solution 50 mL (0 mLs Intravenous Hold 03/01/21 1833)  acetaminophen (TYLENOL) tablet 650 mg (650 mg Oral Given 03/01/21 2151)    Or  acetaminophen (TYLENOL) suppository 650 mg ( Rectal See Alternative 03/01/21 2151)  nicotine (NICODERM CQ - dosed in mg/24 hours) patch 14 mg (has no administration in time range)  benzonatate (TESSALON) capsule 200 mg (has no administration in time range)   stroke: mapping our early stages of recovery book ( Does not apply Not Given 03/02/21 0934)  iohexol (OMNIPAQUE) 350 MG/ML injection 100 mL (100 mLs Intravenous Contrast Given 03/01/21 1725)  aspirin tablet 325 mg (325 mg Oral Given 03/01/21 2106)  potassium chloride SA (KLOR-CON M) CR tablet 40 mEq (40 mEq Oral Given 03/01/21 2106)    Mobility walks with device Moderate fall risk   Focused Assessments Neuro Assessment Handoff:  Swallow screen pass? Yes    NIH Stroke Scale ( + Modified Stroke Scale Criteria)  Interval: Shift  assessment Level of Consciousness (1a.)   : Alert, keenly responsive LOC Questions (1b. )   +: Answers both questions correctly LOC Commands (1c. )   + : Performs both tasks correctly Best Gaze (2. )  +: Normal Visual (3. )  +: No visual loss (Pt would not allow RN to assess r/t to HA) Facial Palsy (4. )    : Normal symmetrical movements Motor Arm, Left (5a. )   +: Drift (r/t to previous stroke deficit) Motor Arm, Right (5b. )   +: No drift Motor Leg, Left (6a. )   +: Drift Motor Leg, Right (6b. )   +: Drift Limb Ataxia (7. ): Present in one limb Sensory (8. )   +: Mild-to-moderate sensory loss, patient feels pinprick is less sharp or is dull on the affected side, or there is a loss of superficial pain  with pinprick, but patient is aware of being touched (related to previous deficit) Best Language (9. )   +: No aphasia Dysarthria (10. ): Normal Extinction/Inattention (11.)   +: No Abnormality Modified SS Total  +: 4 Complete NIHSS TOTAL: 5     Neuro Assessment: Within Defined Limits Neuro Checks:   Shift assessment (03/01/21 2134)  Last Documented NIHSS Modified Score: 4 (03/02/21 0930) Has TPA been given? No If patient is a Neuro Trauma and patient is going to OR before floor call report to [place] nurse: [phone] or [phone]   R Recommendations: See Admitting Provider Note  Report given to:   Additional Notes:

## 2021-03-02 NOTE — Progress Notes (Addendum)
Occupational Therapy Treatment Patient Details Name: [name] MRN: [id number] DOB: Nov 16, 1955 Today's Date: 03/02/2021   History of present illness 64 y.o. African American male 65 y.o. African American male presented to ED for syncope, headache, hypotension and left lower extremity worsening weakness.  PMH of hypertension, hyperlipidemia, CAD status post CABG, cocaine abuse, cardiomyopathy, smoker, non-Hodgkin's lymphoma, stroke in 02/2019 with residual L sided weakness.   OT comments  Pt's primary complaint is his sore throat. Pt required encouragement to participate with OT in effort to see how he was mobilizing and able to take care of himself to facilitate safe DC home. Pt unsteady and required use of RW with S. Recommend DC home @ RW level with S for mobility and ADL. Pt states his "people" will be able to assist at DC. Would benefit from [facility], counseling on drug cessation and following up with an eye doctor due to his low vision. No further acute OT needed.    Recommendations for follow up therapy are one component of a multi-disciplinary discharge planning process, led by the attending physician.  Recommendations may be updated based on patient status, additional functional criteria and insurance authorization.    Follow Up Recommendations  Home health OT    Assistance Recommended at Discharge Intermittent Supervision/Assistance  Equipment Recommendations  Other (comment) (RW)    Recommendations for Other Services PT consult    Precautions / Restrictions Precautions Precautions: Fall Precaution Comments: port       Mobility Bed Mobility Overal bed mobility: Modified Independent                  Transfers Overall transfer level: Needs assistance   Transfers: Sit to/from Stand Sit to Stand: Supervision           General transfer comment: pr refused     Balance Overall balance assessment: Needs assistance   Sitting balance-Leahy Scale: Fair        Standing balance-Leahy Scale: Poor                             ADL either performed or assessed with clinical judgement   ADL Overall ADL's : S to prevent falls                                       General ADL Comments: S for safety    Extremity/Trunk Assessment Upper Extremity Assessment Upper Extremity Assessment: Generalized weakness (hx of L sided weakness; at baseline; using funcitonally without any difficulty)   Lower Extremity Assessment Lower Extremity Assessment: Defer to PT evaluation   Cervical / Trunk Assessment Cervical / Trunk Assessment: Kyphotic    Vision Patient Visual Report: Blurring of vision Additional Comments: low vision at baseline; suggested he follow up with an eye doctor which he does not do currently   Perception     Praxis      Cognition Arousal/Alertness: Awake/alert Behavior During Therapy: Agitated;Restless;Impulsive Overall Cognitive Status: No family/caregiver present to determine baseline cognitive functioning                                 General Comments: most likely at baseline          Exercises     Shoulder Instructions       General Comments incontinent of  urine; cussing at therapists    Pertinent Vitals/ Pain       Pain Assessment: Faces Faces Pain Scale: Hurts little more Pain Location: throat Pain Descriptors / Indicators: Aching;Burning Pain Intervention(s): Limited activity within patient's tolerance  Home Living Family/patient expects to be discharged to:: Private residence Living Arrangements: Other relatives Available Help at Discharge: Available 24 hours/day Type of Home: Apartment Home Access: Stairs to enter [employer] of Steps: 1   Home Layout: One level     Bathroom Shower/Tub: Teacher, early years/pre: Standard Bathroom Accessibility: Yes How Accessible: Accessible via walker Home Equipment: Rolling Walker (2 wheels);Cane -  single point          Prior Functioning/Environment              Frequency           Progress Toward Goals  OT Goals(current goals can now be found in the care plan section)  Progress towards OT goals:  (all further OT to be met by [name])  Acute Rehab OT Goals Patient Stated Goal: to sleep and be left alone OT Goal Formulation: All assessment and education complete, DC therapy  Plan      Co-evaluation                 AM-PAC OT "6 Clicks" Daily Activity     Outcome Measure   Help from another person eating meals?: None Help from another person taking care of personal grooming?: None Help from another person toileting, which includes using toliet, bedpan, or urinal?: A Little Help from another person bathing (including washing, rinsing, drying)?: A Little Help from another person to put on and taking off regular upper body clothing?: A Little Help from another person to put on and taking off regular lower body clothing?: A Little 6 Click Score: 20    End of Session Equipment Utilized During Treatment: Rolling walker (2 wheels)  OT Visit Diagnosis: Other abnormalities of gait and mobility (R26.89);Unsteadiness on feet (R26.81)   Activity Tolerance Patient tolerated treatment well   Patient Left in bed;with call bell/phone within reach;with bed alarm set   Nurse Communication Mobility status;Other (comment) (DC needs)        Time: 1359-1416 OT Time Calculation (min): 17 min  Charges: OT General Charges $OT Visit: 1 Visit   OT Treatments $Self Care/Home Management : 8-22 mins  Maurie Boettcher, OT/L   Acute OT Clinical Specialist [place] Pager [phone] Office [phone]   [facility] 03/02/2021, 2:40 PM

## 2021-03-02 NOTE — TOC Transition Note (Signed)
Transition of Care [facility]) - CM/SW Discharge Note   Patient Details  Name: [name] MRN: [id number] Date of Birth: 1955-10-16  Transition of Care [facility]) CM/SW Contact:  Pollie Friar, RN Phone Number: 03/02/2021, 2:50 PM   Clinical Narrative:    Patient discharging back to his boarding house. He states there is one other person living there that could call for assist if needed.  Recommendations for home health and walker. Pt states he has a walker at home. Pt refusing home health services.  Pt's nephew to provide transport home.   Final next level of care: Home/Self Care Barriers to Discharge: No Barriers Identified   Patient Goals and CMS Choice        Discharge Placement                       Discharge Plan and Services                                     Social Determinants of Health (SDOH) Interventions     Readmission Risk Interventions No flowsheet data found.

## 2021-03-02 NOTE — ED Notes (Signed)
Pt asleep when entering room to obtain blood samples and perform neuro check. Able to arouse pt easily, A+Ox4. Pt adamantly declined NIHSS assessment. Pt moving all limbs freely, NAD at this time.

## 2021-03-02 NOTE — ED Notes (Signed)
Breakfast orders placed 

## 2021-04-04 ENCOUNTER — Other Ambulatory Visit: Payer: Self-pay

## 2021-04-04 ENCOUNTER — Inpatient Hospital Stay: Payer: Medicare Other | Attending: Internal Medicine

## 2021-04-04 DIAGNOSIS — C8253 Diffuse follicle center lymphoma, intra-abdominal lymph nodes: Secondary | ICD-10-CM | POA: Diagnosis present

## 2021-04-04 DIAGNOSIS — Z452 Encounter for adjustment and management of vascular access device: Secondary | ICD-10-CM | POA: Diagnosis present

## 2021-04-04 DIAGNOSIS — Z95828 Presence of other vascular implants and grafts: Secondary | ICD-10-CM

## 2021-04-04 DIAGNOSIS — C8338 Diffuse large B-cell lymphoma, lymph nodes of multiple sites: Secondary | ICD-10-CM

## 2021-04-04 MED ORDER — HEPARIN SOD (PORK) LOCK FLUSH 100 UNIT/ML IV SOLN
500.0000 [IU] | Freq: Once | INTRAVENOUS | Status: AC
  Administered 2021-04-04: 500 [IU]

## 2021-04-04 MED ORDER — SODIUM CHLORIDE 0.9% FLUSH
10.0000 mL | Freq: Once | INTRAVENOUS | Status: AC
  Administered 2021-04-04: 10 mL

## 2021-05-31 ENCOUNTER — Ambulatory Visit (HOSPITAL_COMMUNITY)
Admission: RE | Admit: 2021-05-31 | Discharge: 2021-05-31 | Disposition: A | Discharge status: Home / Self Care | Payer: Medicare Other | Source: Ambulatory Visit | Attending: Internal Medicine | Admitting: Internal Medicine

## 2021-05-31 ENCOUNTER — Inpatient Hospital Stay: Payer: Medicare Other

## 2021-05-31 ENCOUNTER — Other Ambulatory Visit: Payer: Self-pay

## 2021-05-31 ENCOUNTER — Inpatient Hospital Stay: Payer: Medicare Other | Attending: Internal Medicine

## 2021-05-31 DIAGNOSIS — Z95828 Presence of other vascular implants and grafts: Secondary | ICD-10-CM

## 2021-05-31 DIAGNOSIS — C8253 Diffuse follicle center lymphoma, intra-abdominal lymph nodes: Secondary | ICD-10-CM | POA: Diagnosis present

## 2021-05-31 DIAGNOSIS — Z9221 Personal history of antineoplastic chemotherapy: Secondary | ICD-10-CM | POA: Insufficient documentation

## 2021-05-31 DIAGNOSIS — C8338 Diffuse large B-cell lymphoma, lymph nodes of multiple sites: Secondary | ICD-10-CM

## 2021-05-31 LAB — CBC WITH DIFFERENTIAL (CANCER CENTER ONLY)
Abs Immature Granulocytes: 0 10*3/uL (ref 0.00–0.07)
Basophils Absolute: 0 10*3/uL (ref 0.0–0.1)
Basophils Relative: 1 %
Eosinophils Absolute: 0.1 10*3/uL (ref 0.0–0.5)
Eosinophils Relative: 3 %
HCT: 40.2 % (ref 39.0–52.0)
Hemoglobin: 13.4 g/dL (ref 13.0–17.0)
Immature Granulocytes: 0 %
Lymphocytes Relative: 32 %
Lymphs Abs: 0.9 10*3/uL (ref 0.7–4.0)
MCH: 31.9 pg (ref 26.0–34.0)
MCHC: 33.3 g/dL (ref 30.0–36.0)
MCV: 95.7 fL (ref 80.0–100.0)
Monocytes Absolute: 0.3 10*3/uL (ref 0.1–1.0)
Monocytes Relative: 9 %
Neutro Abs: 1.6 10*3/uL — ABNORMAL LOW (ref 1.7–7.7)
Neutrophils Relative %: 55 %
Platelet Count: 225 10*3/uL (ref 150–400)
RBC: 4.2 MIL/uL — ABNORMAL LOW (ref 4.22–5.81)
RDW: 13 % (ref 11.5–15.5)
WBC Count: 2.8 10*3/uL — ABNORMAL LOW (ref 4.0–10.5)
nRBC: 0 % (ref 0.0–0.2)

## 2021-05-31 LAB — CMP (CANCER CENTER ONLY)
ALT: 15 U/L (ref 0–44)
AST: 18 U/L (ref 15–41)
Albumin: 3.8 g/dL (ref 3.5–5.0)
Alkaline Phosphatase: 65 U/L (ref 38–126)
Anion gap: 3 — ABNORMAL LOW (ref 5–15)
BUN: 7 mg/dL — ABNORMAL LOW (ref 8–23)
CO2: 30 mmol/L (ref 22–32)
Calcium: 8.9 mg/dL (ref 8.9–10.3)
Chloride: 105 mmol/L (ref 98–111)
Creatinine: 0.81 mg/dL (ref 0.61–1.24)
GFR, Estimated: >60 mL/min (ref >60)
Glucose, Bld: 77 mg/dL (ref 70–99)
Potassium: 4 mmol/L (ref 3.5–5.1)
Sodium: 138 mmol/L (ref 135–145)
Total Bilirubin: 0.5 mg/dL (ref 0.3–1.2)
Total Protein: 6.7 g/dL (ref 6.5–8.1)

## 2021-05-31 LAB — LACTATE DEHYDROGENASE: LDH: 120 U/L (ref 98–192)

## 2021-05-31 MED ORDER — SODIUM CHLORIDE 0.9% FLUSH
10.0000 mL | Freq: Once | INTRAVENOUS | Status: AC
  Administered 2021-05-31: 10 mL

## 2021-05-31 MED ORDER — IOHEXOL 300 MG/ML  SOLN
80.0000 mL | Freq: Once | INTRAMUSCULAR | Status: AC | PRN
  Administered 2021-05-31: 80 mL via INTRAVENOUS

## 2021-05-31 MED ORDER — HEPARIN SOD (PORK) LOCK FLUSH 100 UNIT/ML IV SOLN
500.0000 [IU] | Freq: Once | INTRAVENOUS | Status: AC
Start: 2021-05-31 — End: 2021-05-31
  Administered 2021-05-31: 500 [IU] via INTRAVENOUS

## 2021-05-31 MED ORDER — HEPARIN SOD (PORK) LOCK FLUSH 100 UNIT/ML IV SOLN
INTRAVENOUS | Status: AC
Start: 2021-05-31 — End: 2021-05-31
  Filled 2021-05-31: qty 5

## 2021-05-31 MED ORDER — SODIUM CHLORIDE (PF) 0.9 % IJ SOLN
INTRAMUSCULAR | Status: AC
  Filled 2021-05-31: qty 50

## 2021-06-02 ENCOUNTER — Inpatient Hospital Stay (HOSPITAL_BASED_OUTPATIENT_CLINIC_OR_DEPARTMENT_OTHER): Payer: Medicare Other | Admitting: Internal Medicine

## 2021-06-02 ENCOUNTER — Encounter: Payer: Self-pay | Admitting: Internal Medicine

## 2021-06-02 ENCOUNTER — Other Ambulatory Visit: Payer: Self-pay

## 2021-06-02 ENCOUNTER — Inpatient Hospital Stay: Payer: Medicare Other

## 2021-06-02 VITALS — BP 92/81 | HR 65 | Temp 96.6°F | Resp 19 | Ht 67.0 in | Wt 116.9 lb

## 2021-06-02 DIAGNOSIS — C8338 Diffuse large B-cell lymphoma, lymph nodes of multiple sites: Secondary | ICD-10-CM

## 2021-06-02 DIAGNOSIS — C8253 Diffuse follicle center lymphoma, intra-abdominal lymph nodes: Secondary | ICD-10-CM | POA: Diagnosis not present

## 2021-06-02 DIAGNOSIS — Z9221 Personal history of antineoplastic chemotherapy: Secondary | ICD-10-CM | POA: Diagnosis not present

## 2021-06-02 NOTE — Progress Notes (Signed)
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    PRIOR THERAPY: Systemic chemotherapy with R-CHOP every 3 weeks.  First dose May 20, 2019.  Status post 8 cycles.  Last dose was given October 14, 2019.   CURRENT THERAPY: Observation.  INTERVAL HISTORY: [name] 66 y.o. male returns to the clinic today for follow-up visit.  The patient is feeling fine today with no concerning complaints.  He denied having any current weight loss or night sweats.  He has no nausea, vomiting, abdominal pain, diarrhea or constipation.  He has no chest pain, shortness of breath, cough or hemoptysis.  He has no fever or chills.  He has been in observation for more than 18 months and he is here for evaluation and repeat blood work and scan for restaging of his disease.   MEDICAL HISTORY: Past Medical History:  Diagnosis Date   CAD (coronary artery disease)    a. 04/2008 Cath: LM nl, LAD 50p, 84m, LCX min irregs, RCA 50p/m, 40d, RPDA 60-70ost; b. 04/2008 CABG x 4: LIMA->LAD, VG->D2, VG->RPDA->RPL.   Cancer ([place])    Cocaine abuse ([place])    a. Quit early 2019.   Ischemic cardiomyopathy    a. LV gram: EF 30-35%, apical/periapical AK.   Stroke [facility]) 02/2019   Stroke ([place]) 02/2019   Tobacco abuse     ALLERGIES:  has No Known Allergies.  MEDICATIONS:  Current Outpatient Medications  Medication Sig Dispense Refill   aspirin EC 81 MG tablet Take 1 tablet (81 mg total) by mouth daily. 30 tablet 3   rosuvastatin  (CRESTOR) 20 MG tablet Take 1 tablet (20 mg total) by mouth daily. 90 tablet 3   No current facility-administered medications for this visit.   Facility-Administered Medications Ordered in Other Visits  Medication Dose Route Frequency Provider Last Rate Last Admin   heparin lock flush 100 unit/mL  500 Units Intracatheter Once [name], MD       heparin lock flush 100 unit/mL  500 Units Intracatheter Once [name], MD       sodium chloride flush (NS) 0.9 % injection 10 mL  10 mL Intracatheter Once [name], MD       sodium chloride flush (NS) 0.9 % injection 10 mL  10 mL Intracatheter Once [name], MD        SURGICAL HISTORY:  Past Surgical History:  Procedure Laterality Date   BYPASS GRAFT  2010   CARDIAC SURGERY  2010   IR IMAGING GUIDED PORT INSERTION  05/26/2019   SUPRACLAVICAL NODE BIOPSY Left 05/08/2019   Procedure: SUPRACLAVICAL NODE BIOPSY;  Surgeon: [name], MD;  Location: [place];  Service: Thoracic;  Laterality: Left;    REVIEW OF SYSTEMS:  A comprehensive review of systems was negative.   PHYSICAL EXAMINATION: General appearance: alert, cooperative, and no distress Head: Normocephalic, without obvious abnormality, atraumatic Neck: no adenopathy, no JVD, supple, symmetrical, trachea midline, and thyroid not enlarged, symmetric, no tenderness/mass/nodules Lymph nodes: Cervical, supraclavicular, and axillary nodes normal. Resp:  clear to auscultation bilaterally Back: symmetric, no curvature. ROM normal. No CVA tenderness. Cardio: regular rate and rhythm, S1, S2 normal, no murmur, click, rub or gallop GI: soft, non-tender; bowel sounds normal; no masses,  no organomegaly Extremities: extremities normal, atraumatic, no cyanosis or edema  ECOG PERFORMANCE STATUS: 1 - Symptomatic but completely ambulatory  Blood pressure 92/81, pulse 65, temperature (!) 96.6 F (35.9 C), temperature source Tympanic, resp. rate 19, height 5\' 7"  (1.702  m), weight 116 lb 14.4 oz (53 kg), SpO2 100 %.  LABORATORY DATA: Lab Results  Component Value Date   WBC 2.8 (L) 05/31/2021   HGB 13.4 05/31/2021   HCT 40.2 05/31/2021   MCV 95.7 05/31/2021   PLT 225 05/31/2021      Chemistry      Component Value Date/Time   NA 138 05/31/2021 0910   K 4.0 05/31/2021 0910   CL 105 05/31/2021 0910   CO2 30 05/31/2021 0910   BUN 7 (L) 05/31/2021 0910   CREATININE 0.81 05/31/2021 0910      Component Value Date/Time   CALCIUM 8.9 05/31/2021 0910   ALKPHOS 65 05/31/2021 0910   AST 18 05/31/2021 0910   ALT 15 05/31/2021 0910   BILITOT 0.5 05/31/2021 0910       RADIOGRAPHIC STUDIES: CT Chest W Contrast  Result Date: 05/31/2021 CLINICAL DATA:  History of stage IV large B-cell lymphoma, bulky right hilar and central perihilar mass. Splenic enlargement. Surveillance. EXAM: CT CHEST, ABDOMEN, AND PELVIS WITH CONTRAST TECHNIQUE: Multidetector CT imaging of the chest, abdomen and pelvis was performed following the standard protocol during bolus administration of intravenous contrast. RADIATION DOSE REDUCTION: This exam was performed according to the departmental dose-optimization program which includes automated exposure control, adjustment of the mA and/or kV according to patient size and/or use of iterative reconstruction technique. CONTRAST:  33mL OMNIPAQUE IOHEXOL 300 MG/ML  SOLN COMPARISON:  Multiple priors including most recent CTs May 17, 2020 and March 01, 2021. FINDINGS: CT CHEST FINDINGS Cardiovascular: Accessed right chest Port-A-Cath with tip at the superior cavoatrial junction. Prior median sternotomy and CABG. Normal caliber thoracic aorta. No central pulmonary embolus on this nondedicated study. Dense calcifications of the native coronary arteries. Normal size heart. No significant pericardial effusion/thickening. Mediastinum/Nodes: No supraclavicular adenopathy. No discrete thyroid nodule. No pathologically enlarged mediastinal, hilar or  axillary lymph nodes. The trachea and esophagus are grossly unremarkable. Lungs/Pleura: Stable 6 mm left upper lobe pulmonary nodule on image 66/4. Stable subpleural nodule in the left upper lobe measuring 4 mm on image 55/4. Stable tiny pulmonary nodules in the anterior right upper lobe on image 65/4 measuring up to 2 mm. Stable 3 mm pulmonary nodule in the right lung apex on image 28/4. No new suspicious pulmonary nodules or masses. No pleural effusion or pneumothorax. Musculoskeletal: Multilevel degenerative changes spine. Prior median sternotomy. No aggressive lytic or blastic lesion of bone. CT ABDOMEN PELVIS FINDINGS Hepatobiliary: No suspicious hepatic lesion. Gallbladder is unremarkable. No biliary ductal dilation. Pancreas: Similar prominence of the pancreatic duct without evidence of acute inflammation. Spleen: No splenomegaly. Similar lobular splenic contour. The low-density splenic lesions measuring up to 11 mm on image 48/2 are stable over multiple priors. Adrenals/Urinary Tract: Bilateral adrenal glands appear normal. No hydronephrosis. Kidneys demonstrate symmetric enhancement and excretion of contrast. No solid enhancing renal mass. Urinary bladder wall diverticulum. Stomach/Bowel: Radiopaque enteric contrast material traverses the rectum. Stomach is unremarkable for degree of distension. No pathologic dilation of small or large bowel. No evidence of acute  bowel inflammation. Vascular/Lymphatic: No abdominal aortic aneurysm. No gastrohepatic or hepatoduodenal ligament lymphadenopathy. No retroperitoneal or mesenteric lymphadenopathy. No pelvic sidewall lymphadenopathy. No groin lymphadenopathy. Reproductive: Prostate is unremarkable. Other: No significant abdominopelvic free fluid. Musculoskeletal: No aggressive lytic or blastic lesion of bone. Multilevel degenerative changes spine. Similar chronic irregularity of the right hip related to prior fracture. Degenerative changes of the right greater than  left hips. IMPRESSION: 1. Stable examination without adenopathy in the chest, abdomen, or pelvis. 2. No splenomegaly or evidence of osseous lymphomatous involvement. 3. Stable small bilateral pulmonary nodules. Electronically Signed   By: Dahlia Bailiff M.D.   On: 05/31/2021 14:59   CT Abdomen Pelvis W Contrast  Result Date: 05/31/2021 CLINICAL DATA:  History of stage IV large B-cell lymphoma, bulky right hilar and central perihilar mass. Splenic enlargement. Surveillance. EXAM: CT CHEST, ABDOMEN, AND PELVIS WITH CONTRAST TECHNIQUE: Multidetector CT imaging of the chest, abdomen and pelvis was performed following the standard protocol during bolus administration of intravenous contrast. RADIATION DOSE REDUCTION: This exam was performed according to the departmental dose-optimization program which includes automated exposure control, adjustment of the mA and/or kV according to patient size and/or use of iterative reconstruction technique. CONTRAST:  35mL OMNIPAQUE IOHEXOL 300 MG/ML  SOLN COMPARISON:  Multiple priors including most recent CTs May 17, 2020 and March 01, 2021. FINDINGS: CT CHEST FINDINGS Cardiovascular: Accessed right chest Port-A-Cath with tip at the superior cavoatrial junction. Prior median sternotomy and CABG. Normal caliber thoracic aorta. No central pulmonary embolus on this nondedicated study. Dense calcifications of the native coronary arteries. Normal size heart. No significant pericardial effusion/thickening. Mediastinum/Nodes: No supraclavicular adenopathy. No discrete thyroid nodule. No pathologically enlarged mediastinal, hilar or axillary lymph nodes. The trachea and esophagus are grossly unremarkable. Lungs/Pleura: Stable 6 mm left upper lobe pulmonary nodule on image 66/4. Stable subpleural nodule in the left upper lobe measuring 4 mm on image 55/4. Stable tiny pulmonary nodules in the anterior right upper lobe on image 65/4 measuring up to 2 mm. Stable 3 mm pulmonary nodule in  the right lung apex on image 28/4. No new suspicious pulmonary nodules or masses. No pleural effusion or pneumothorax. Musculoskeletal: Multilevel degenerative changes spine. Prior median sternotomy. No aggressive lytic or blastic lesion of bone. CT ABDOMEN PELVIS FINDINGS Hepatobiliary: No suspicious hepatic lesion. Gallbladder is unremarkable. No biliary ductal dilation. Pancreas: Similar prominence of the pancreatic duct without evidence of acute inflammation. Spleen: No splenomegaly. Similar lobular splenic contour. The low-density splenic lesions measuring up to 11 mm on image 48/2 are stable over multiple priors. Adrenals/Urinary Tract: Bilateral adrenal glands appear normal. No hydronephrosis. Kidneys demonstrate symmetric enhancement and excretion of contrast. No solid enhancing renal mass. Urinary bladder wall diverticulum. Stomach/Bowel: Radiopaque enteric contrast material traverses the rectum. Stomach is unremarkable for degree of distension. No pathologic dilation of small or large bowel. No evidence of acute bowel inflammation. Vascular/Lymphatic: No abdominal aortic aneurysm. No gastrohepatic or hepatoduodenal ligament lymphadenopathy. No retroperitoneal or mesenteric lymphadenopathy. No pelvic sidewall lymphadenopathy. No groin lymphadenopathy. Reproductive: Prostate is unremarkable. Other: No significant abdominopelvic free fluid. Musculoskeletal: No aggressive lytic or blastic lesion of bone. Multilevel degenerative changes spine. Similar chronic irregularity of the right hip related to prior fracture. Degenerative changes of the right greater than left hips. IMPRESSION: 1. Stable examination without adenopathy in the chest, abdomen, or pelvis. 2. No splenomegaly or evidence of osseous lymphomatous involvement. 3. Stable small bilateral pulmonary nodules. Electronically Signed   By: Dahlia Bailiff M.D.   On: 05/31/2021 14:59  ASSESSMENT AND PLAN: This is a 66  years old African-American male  recently diagnosed with 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  The patient completed course of systemic chemotherapy with R-CHOP status post 8 cycles.  He tolerated his treatment fairly well with no significant adverse effects. The patient has been in observation since August 2021.  He has been doing fine with no concerning issues. He had repeat CT scan of the chest, abdomen pelvis performed recently.  I personally and independently reviewed the scans and discussed the result with the patient today. His scan showed no concerning findings for disease recurrence or metastasis. I recommended for him to continue on observation with repeat blood work in 6 months. He was advised to call immediately if he has any concerning symptoms in the interval. The patient voices understanding of current disease status and treatment options and is in agreement with the current care plan.  All questions were answered. The patient knows to call the clinic with any problems, questions or concerns. We can certainly see the patient much sooner if necessary.   Disclaimer: This note was dictated with voice recognition software. Similar sounding words can inadvertently be transcribed and may not be corrected upon review.

## 2021-08-02 ENCOUNTER — Other Ambulatory Visit: Payer: Self-pay

## 2021-08-02 ENCOUNTER — Inpatient Hospital Stay: Payer: Medicare Other

## 2021-08-02 ENCOUNTER — Inpatient Hospital Stay: Payer: Medicare Other | Attending: Internal Medicine

## 2021-08-02 DIAGNOSIS — C8338 Diffuse large B-cell lymphoma, lymph nodes of multiple sites: Secondary | ICD-10-CM

## 2021-08-02 DIAGNOSIS — C8253 Diffuse follicle center lymphoma, intra-abdominal lymph nodes: Secondary | ICD-10-CM | POA: Diagnosis present

## 2021-08-02 DIAGNOSIS — Z452 Encounter for adjustment and management of vascular access device: Secondary | ICD-10-CM | POA: Insufficient documentation

## 2021-08-02 DIAGNOSIS — Z95828 Presence of other vascular implants and grafts: Secondary | ICD-10-CM

## 2021-08-02 MED ORDER — SODIUM CHLORIDE 0.9% FLUSH
10.0000 mL | Freq: Once | INTRAVENOUS | Status: AC
  Administered 2021-08-02: 10 mL

## 2021-08-02 MED ORDER — HEPARIN SOD (PORK) LOCK FLUSH 100 UNIT/ML IV SOLN
500.0000 [IU] | Freq: Once | INTRAVENOUS | Status: AC
  Administered 2021-08-02: 500 [IU]

## 2021-09-30 ENCOUNTER — Telehealth: Payer: Self-pay | Admitting: Internal Medicine

## 2021-09-30 NOTE — Telephone Encounter (Signed)
Rescheduled per charge's nurse request, patient has been called and voicemail was left.

## 2021-10-03 ENCOUNTER — Inpatient Hospital Stay: Payer: Medicare Other | Attending: Internal Medicine

## 2021-10-03 ENCOUNTER — Inpatient Hospital Stay: Payer: Medicare Other

## 2021-10-03 ENCOUNTER — Other Ambulatory Visit: Payer: Self-pay

## 2021-10-03 DIAGNOSIS — Z452 Encounter for adjustment and management of vascular access device: Secondary | ICD-10-CM | POA: Insufficient documentation

## 2021-10-03 DIAGNOSIS — C8253 Diffuse follicle center lymphoma, intra-abdominal lymph nodes: Secondary | ICD-10-CM | POA: Diagnosis present

## 2021-10-03 DIAGNOSIS — Z95828 Presence of other vascular implants and grafts: Secondary | ICD-10-CM

## 2021-10-03 DIAGNOSIS — C8338 Diffuse large B-cell lymphoma, lymph nodes of multiple sites: Secondary | ICD-10-CM

## 2021-10-03 MED ORDER — SODIUM CHLORIDE 0.9% FLUSH
10.0000 mL | Freq: Once | INTRAVENOUS | Status: AC
  Administered 2021-10-03: 10 mL

## 2021-10-03 MED ORDER — HEPARIN SOD (PORK) LOCK FLUSH 100 UNIT/ML IV SOLN
500.0000 [IU] | Freq: Once | INTRAVENOUS | Status: AC
  Administered 2021-10-03: 500 [IU]

## 2021-11-17 ENCOUNTER — Telehealth: Payer: Self-pay | Admitting: Internal Medicine

## 2021-11-17 NOTE — Telephone Encounter (Signed)
Rescheduled 09/07 appointments due to scheduling conflict and provider pal, called to inform patient. Left a voicemail.

## 2021-12-01 ENCOUNTER — Other Ambulatory Visit: Payer: Medicare Other

## 2021-12-01 ENCOUNTER — Ambulatory Visit: Payer: Medicare Other | Admitting: Internal Medicine

## 2021-12-05 ENCOUNTER — Other Ambulatory Visit: Payer: Medicare Other

## 2021-12-06 ENCOUNTER — Inpatient Hospital Stay: Payer: Medicare Other

## 2021-12-06 ENCOUNTER — Telehealth: Payer: Self-pay | Admitting: Medical Oncology

## 2021-12-06 ENCOUNTER — Other Ambulatory Visit: Payer: Self-pay

## 2021-12-06 ENCOUNTER — Inpatient Hospital Stay: Payer: Medicare Other | Attending: Internal Medicine | Admitting: Internal Medicine

## 2021-12-06 VITALS — BP 142/78 | HR 56 | Temp 98.8°F | Ht 67.0 in | Wt 113.7 lb

## 2021-12-06 DIAGNOSIS — Z452 Encounter for adjustment and management of vascular access device: Secondary | ICD-10-CM | POA: Insufficient documentation

## 2021-12-06 DIAGNOSIS — C8253 Diffuse follicle center lymphoma, intra-abdominal lymph nodes: Secondary | ICD-10-CM | POA: Insufficient documentation

## 2021-12-06 DIAGNOSIS — D649 Anemia, unspecified: Secondary | ICD-10-CM | POA: Diagnosis not present

## 2021-12-06 DIAGNOSIS — Z95828 Presence of other vascular implants and grafts: Secondary | ICD-10-CM

## 2021-12-06 DIAGNOSIS — Z9221 Personal history of antineoplastic chemotherapy: Secondary | ICD-10-CM | POA: Insufficient documentation

## 2021-12-06 DIAGNOSIS — D72819 Decreased white blood cell count, unspecified: Secondary | ICD-10-CM | POA: Insufficient documentation

## 2021-12-06 DIAGNOSIS — C8333 Diffuse large B-cell lymphoma, intra-abdominal lymph nodes: Secondary | ICD-10-CM

## 2021-12-06 DIAGNOSIS — C8338 Diffuse large B-cell lymphoma, lymph nodes of multiple sites: Secondary | ICD-10-CM

## 2021-12-06 LAB — CMP (CANCER CENTER ONLY)
ALT: 12 U/L (ref 0–44)
AST: 12 U/L — ABNORMAL LOW (ref 15–41)
Albumin: 3.5 g/dL (ref 3.5–5.0)
Alkaline Phosphatase: 59 U/L (ref 38–126)
Anion gap: 3 — ABNORMAL LOW (ref 5–15)
BUN: 11 mg/dL (ref 8–23)
CO2: 30 mmol/L (ref 22–32)
Calcium: 8.8 mg/dL — ABNORMAL LOW (ref 8.9–10.3)
Chloride: 109 mmol/L (ref 98–111)
Creatinine: 0.79 mg/dL (ref 0.61–1.24)
GFR, Estimated: >60 mL/min (ref >60)
Glucose, Bld: 102 mg/dL — ABNORMAL HIGH (ref 70–99)
Potassium: 3.8 mmol/L (ref 3.5–5.1)
Sodium: 142 mmol/L (ref 135–145)
Total Bilirubin: 0.4 mg/dL (ref 0.3–1.2)
Total Protein: 5.9 g/dL — ABNORMAL LOW (ref 6.5–8.1)

## 2021-12-06 LAB — CBC WITH DIFFERENTIAL (CANCER CENTER ONLY)
Abs Immature Granulocytes: 0 10*3/uL (ref 0.00–0.07)
Basophils Absolute: 0 10*3/uL (ref 0.0–0.1)
Basophils Relative: 1 %
Eosinophils Absolute: 0.1 10*3/uL (ref 0.0–0.5)
Eosinophils Relative: 2 %
HCT: 37.1 % — ABNORMAL LOW (ref 39.0–52.0)
Hemoglobin: 12.8 g/dL — ABNORMAL LOW (ref 13.0–17.0)
Immature Granulocytes: 0 %
Lymphocytes Relative: 41 %
Lymphs Abs: 1.1 10*3/uL (ref 0.7–4.0)
MCH: 33.2 pg (ref 26.0–34.0)
MCHC: 34.5 g/dL (ref 30.0–36.0)
MCV: 96.1 fL (ref 80.0–100.0)
Monocytes Absolute: 0.2 10*3/uL (ref 0.1–1.0)
Monocytes Relative: 6 %
Neutro Abs: 1.3 10*3/uL — ABNORMAL LOW (ref 1.7–7.7)
Neutrophils Relative %: 50 %
Platelet Count: 152 10*3/uL (ref 150–400)
RBC: 3.86 MIL/uL — ABNORMAL LOW (ref 4.22–5.81)
RDW: 13.3 % (ref 11.5–15.5)
Smear Review: ADEQUATE
WBC Count: 2.7 10*3/uL — ABNORMAL LOW (ref 4.0–10.5)
nRBC: 0 % (ref 0.0–0.2)

## 2021-12-06 LAB — LACTATE DEHYDROGENASE: LDH: 99 U/L (ref 98–192)

## 2021-12-06 MED ORDER — HEPARIN SOD (PORK) LOCK FLUSH 100 UNIT/ML IV SOLN
500.0000 [IU] | Freq: Once | INTRAVENOUS | Status: AC
  Administered 2021-12-06: 500 [IU]

## 2021-12-06 MED ORDER — SODIUM CHLORIDE 0.9% FLUSH
10.0000 mL | Freq: Once | INTRAVENOUS | Status: AC
  Administered 2021-12-06: 10 mL

## 2021-12-06 NOTE — Telephone Encounter (Signed)
LVM for niece Verdene Lennert to call re housing situation.

## 2021-12-06 NOTE — Progress Notes (Signed)
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    PRIOR THERAPY: Systemic chemotherapy with R-CHOP every 3 weeks.  First dose May 20, 2019.  Status post 8 cycles.  Last dose was given October 14, 2019.  CURRENT THERAPY: Observation.  INTERVAL HISTORY: [name] 66 y.o. male returns to the clinic today for 6 months follow-up visit.  The patient is feeling fine today with no concerning complaints.  He denied having any chest pain, shortness of breath, cough or hemoptysis.  He has no nausea, vomiting, diarrhea or constipation.  He has no headache or visual changes.  He denied having any fever or chills.  He lost few pounds since his last visit.  He is here today for evaluation and repeat blood work.  MEDICAL HISTORY: Past Medical History:  Diagnosis Date   CAD (coronary artery disease)    a. 04/2008 Cath: LM nl, LAD 50p, 42m, LCX min irregs, RCA 50p/m, 40d, RPDA 60-70ost; b. 04/2008 CABG x 4: LIMA->LAD, VG->D2, VG->RPDA->RPL.   Cancer ([place])    Cocaine abuse ([place])    a. Quit early 2019.   Ischemic cardiomyopathy    a. LV gram: EF 30-35%, apical/periapical AK.   Stroke [facility]) 02/2019   Stroke ([place]) 02/2019   Tobacco abuse     ALLERGIES:  has No Known Allergies.  MEDICATIONS:  Current Outpatient Medications  Medication Sig Dispense Refill   aspirin EC 81 MG tablet Take 1 tablet (81 mg total) by mouth daily. 30 tablet 3   rosuvastatin (CRESTOR) 20 MG tablet Take 1 tablet (20 mg total) by mouth daily. 90  tablet 3   No current facility-administered medications for this visit.   Facility-Administered Medications Ordered in Other Visits  Medication Dose Route Frequency Provider Last Rate Last Admin   heparin lock flush 100 unit/mL  500 Units Intracatheter Once [name], MD       heparin lock flush 100 unit/mL  500 Units Intracatheter Once [name], MD       sodium chloride flush (NS) 0.9 % injection 10 mL  10 mL Intracatheter Once [name], MD       sodium chloride flush (NS) 0.9 % injection 10 mL  10 mL Intracatheter Once [name], MD        SURGICAL HISTORY:  Past Surgical History:  Procedure Laterality Date   BYPASS GRAFT  2010   CARDIAC SURGERY  2010   IR IMAGING GUIDED PORT INSERTION  05/26/2019   SUPRACLAVICAL NODE BIOPSY Left 05/08/2019   Procedure: SUPRACLAVICAL NODE BIOPSY;  Surgeon: [name], MD;  Location: [facility];  Service: Thoracic;  Laterality: Left;    REVIEW OF SYSTEMS:  A comprehensive review of systems was negative except for: Constitutional: positive for fatigue and weight loss   PHYSICAL EXAMINATION: General appearance: alert, cooperative, and no distress Head: Normocephalic, without obvious abnormality, atraumatic Neck: no adenopathy, no JVD, supple, symmetrical, trachea midline, and thyroid not enlarged, symmetric, no tenderness/mass/nodules Lymph nodes: Cervical, supraclavicular, and axillary nodes normal. Resp: clear to auscultation  bilaterally Back: symmetric, no curvature. ROM normal. No CVA tenderness. Cardio: regular rate and rhythm, S1, S2 normal, no murmur, click, rub or gallop GI: soft, non-tender; bowel sounds normal; no masses,  no organomegaly Extremities: extremities normal, atraumatic, no cyanosis or edema  ECOG PERFORMANCE STATUS: 1 - Symptomatic but completely ambulatory  Blood pressure (!) 142/78, pulse (!) 56, temperature 98.8 F (37.1 C), temperature source Tympanic, height 5\' 7"  (1.702 m), weight 113  lb 11.2 oz (51.6 kg), SpO2 100 %.  LABORATORY DATA: Lab Results  Component Value Date   WBC 2.8 (L) 05/31/2021   HGB 13.4 05/31/2021   HCT 40.2 05/31/2021   MCV 95.7 05/31/2021   PLT 225 05/31/2021      Chemistry      Component Value Date/Time   NA 142 12/06/2021 0913   K 3.8 12/06/2021 0913   CL 109 12/06/2021 0913   CO2 30 12/06/2021 0913   BUN 11 12/06/2021 0913   CREATININE 0.79 12/06/2021 0913      Component Value Date/Time   CALCIUM 8.8 (L) 12/06/2021 0913   ALKPHOS 59 12/06/2021 0913   AST 12 (L) 12/06/2021 0913   ALT 12 12/06/2021 0913   BILITOT 0.4 12/06/2021 0913       RADIOGRAPHIC STUDIES: No results found.   ASSESSMENT AND PLAN: This is a 66  years old African-American male recently diagnosed with 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  The patient completed course of systemic chemotherapy with R-CHOP status post 8 cycles.  He tolerated his treatment fairly well with no significant adverse effects. The patient has been on observation since August 2021 and he is feeling fine with no concerning complaints except for mild fatigue. Blood work today is unremarkable except for mild leukocytopenia and anemia. I recommended for him to continue on observation with repeat labs and CT scan of the chest, abdomen and pelvis in 6 months. The patient will have Port-A-Cath flush every 2 months during this interval. He was advised to call immediately if he has any other concerning symptoms in the interval. The patient voices understanding of current disease status and treatment options and is in agreement with the current care plan.  All questions were answered. The patient knows to call the clinic with any problems, questions or concerns. We can  certainly see the patient much sooner if necessary.   Disclaimer: This note was dictated with voice recognition software. Similar sounding words can inadvertently be transcribed and may not be corrected upon review.

## 2022-02-07 ENCOUNTER — Other Ambulatory Visit: Payer: Self-pay

## 2022-02-07 ENCOUNTER — Inpatient Hospital Stay: Payer: Medicare Other

## 2022-02-07 ENCOUNTER — Inpatient Hospital Stay: Payer: Medicare Other | Attending: Internal Medicine

## 2022-02-07 VITALS — BP 117/73 | HR 56 | Temp 97.8°F | Resp 16

## 2022-02-07 DIAGNOSIS — C8338 Diffuse large B-cell lymphoma, lymph nodes of multiple sites: Secondary | ICD-10-CM

## 2022-02-07 DIAGNOSIS — Z452 Encounter for adjustment and management of vascular access device: Secondary | ICD-10-CM | POA: Insufficient documentation

## 2022-02-07 DIAGNOSIS — D72819 Decreased white blood cell count, unspecified: Secondary | ICD-10-CM | POA: Diagnosis not present

## 2022-02-07 DIAGNOSIS — C8253 Diffuse follicle center lymphoma, intra-abdominal lymph nodes: Secondary | ICD-10-CM | POA: Diagnosis present

## 2022-02-07 DIAGNOSIS — Z9221 Personal history of antineoplastic chemotherapy: Secondary | ICD-10-CM | POA: Diagnosis not present

## 2022-02-07 DIAGNOSIS — D649 Anemia, unspecified: Secondary | ICD-10-CM | POA: Insufficient documentation

## 2022-02-07 DIAGNOSIS — Z95828 Presence of other vascular implants and grafts: Secondary | ICD-10-CM

## 2022-02-07 MED ORDER — SODIUM CHLORIDE 0.9% FLUSH
10.0000 mL | Freq: Once | INTRAVENOUS | Status: AC
  Administered 2022-02-07: 10 mL

## 2022-02-07 MED ORDER — HEPARIN SOD (PORK) LOCK FLUSH 100 UNIT/ML IV SOLN
500.0000 [IU] | Freq: Once | INTRAVENOUS | Status: AC
  Administered 2022-02-07: 500 [IU]

## 2022-02-07 NOTE — Patient Instructions (Signed)
[employer], Adult A central line is a long, thin tube (catheter) that is put into a vein so that it goes to a large vein above your heart. It can be used to: Give you medicine or fluids. Give you food and nutrients. Take blood or give you blood for testing or treatments. Types of central lines There are four main types of central lines: Peripherally inserted central catheter (PICC) line. This type is usually put in the upper arm and goes up the arm to the heart. Tunneled central line. This type is placed in a large vein in the neck, chest, or groin. It is tunneled under the skin and brought out through a second incision. Non-tunneled central line. This type is used for a shorter time than other types, usually for 7 days at the most. It is inserted in the neck, chest, or groin. Implanted port. This type can stay in place longer than other types of central lines. It is normally put in the upper chest but can also be placed in the upper arm or the belly. Surgery is needed to put it in and take it out. The type of central line you get will depend on how long you need it and your medical condition. Tell a doctor about: Any allergies you have. All medicines you are taking. These include vitamins, herbs, eye drops, creams, and over-the-counter medicines. Any problems you or family members have had with anesthetic medicines. Any blood disorders you have. Any surgeries you have had. Any medical conditions you have. Whether you are pregnant or may be pregnant. What are the risks? Generally, central lines are safe. However, problems may occur, including: Infection. A blood clot. Bleeding from the place where the central line was inserted. Getting a hole or crack in the central line. If this happens, the central line will need to be replaced. Central line failure. The catheter moving or coming out of place. What happens before the procedure? Medicines Ask your doctor about changing or  stopping: Your normal medicines. Vitamins, herbs, and supplements. Over-the-counter medicines. Do not take aspirin or ibuprofen unless you are told to. General instructions Follow instructions from your doctor about eating or drinking. For your safety, your doctor may: [place]ta [place] the area of the procedure. Remove hair at the procedure site. Ask you to wash with a soap that kills germs. Plan to have a responsible adult take you home from the hospital or clinic. If you will be going home right after the procedure, plan to have a responsible adult care for you for the time you are told. This is important. What happens during the procedure? An IV tube will be put into one of your veins. You may be given: A sedative. This medicine helps you relax. Anesthetics. These medicines numb certain areas of your body. Your skin will be cleaned with a germ-killing (antiseptic) solution. You may be covered with clean drapes. Your blood pressure, heart rate, breathing rate, and blood oxygen level will be monitored during the procedure. The central line will be put into the vein and moved through it to the correct spot. The doctor may use X-ray equipment to help guide the central line to the right place. A bandage (dressing) will be placed over the insertion area. The procedure may vary among doctors and hospitals. What can I expect after the procedure? You will be monitored until you leave the hospital or clinic. This includes checking your blood pressure, heart rate, breathing rate, and blood oxygen level. Caps may  be placed on the ends of the central line tubing. If you were given a sedative during your procedure, do not drive or use machines until your doctor says that it is safe. Follow these instructions at home: Caring for the tube  Follow instructions from your doctor about: Flushing the tube. Cleaning the tube and the area around it. Only use germ-free (sterile) supplies to flush. The supplies  should be from your doctor, a pharmacy, or another place that your doctor recommends. Before you flush the tube or clean the area around the tube: Wash your hands with soap and water for at least 20 seconds. If you cannot use soap and water, use hand sanitizer. Clean the central line hub with rubbing alcohol. To do this: Scrub it using a twisting motion and rub for 10 to 15 seconds or for 30 twists. Follow the manufacturer's instructions. Be sure you scrub the top of the hub, not just the sides. Never reuse alcohol pads. Let the hub dry before use. Keep it from touching anything while drying. Caring for your skin Check the skin around the central line every day for signs of infection. Check for: Redness, swelling, or pain. Fluid or blood. Warmth. Pus or a bad smell. Keep the area where the tube was put in clean and dry. Change bandages only as told by your doctor. Keep your bandage dry. If a bandage gets wet, have it changed right away. General instructions Keep the tube clamped, unless it is being used. If you or someone else accidentally pulls on the tube, make sure: The bandage is okay. There is no bleeding. The tube has not been pulled out. Do not use scissors or sharp objects near the tube. Do not take baths, swim, or use a hot tub until your doctor says it is okay. Ask your doctor if you may take showers. You may only be allowed to take sponge baths. Ask your doctor what activities are safe for you. Your doctor may tell you not to lift anything or move your arm too much. Take over-the-counter and prescription medicines only as told by your doctor. Keep all follow-up visits. Storing and throwing away supplies Keep your supplies in a clean, dry location. Throw away any used syringes in a container that is only for sharp items (sharps container). You can buy a sharps container from a pharmacy, or you can make one by using an empty hard plastic bottle with a cover. Place any used  bandages or infusion bags into a plastic bag. Throw that bag in the trash. Contact a doctor if: You have any of these signs of infection where the tube was put in: Redness, swelling, or pain. Fluid or blood. Warmth. Pus or a bad smell. Get help right away if: You have: A fever or chills. Shortness of breath. Pain in your chest. A fast heartbeat. Swelling in your neck, face, chest, or arm. You feel dizzy or you faint. There are red lines coming from where the tube was put in. The area where the tube was put in is bleeding and the bleeding will not stop. Your tube is hard to flush. You do not get a blood return from the tube. The tube gets loose or comes out. The tube has a hole or a tear. The tube leaks. Summary A central line is a long, thin tube (catheter) that is put in your vein. It can be used to give you medicine, food, or fluids. Follow instructions from your doctor about flushing  and cleaning the tube. Keep the area where the tube was put in clean and dry. Ask your doctor what activities are safe for you. This information is not intended to replace advice given to you by your health care provider. Make sure you discuss any questions you have with your health care provider. Document Revised: 11/13/2019 Document Reviewed: 11/13/2019 Elsevier Patient Education  [place].

## 2022-03-04 ENCOUNTER — Emergency Department (HOSPITAL_COMMUNITY)
Admission: EM | Admit: 2022-03-04 | Discharge: 2022-03-04 | Disposition: A | Discharge status: Home / Self Care | Payer: Medicare Other | Attending: Emergency Medicine | Admitting: Emergency Medicine

## 2022-03-04 ENCOUNTER — Other Ambulatory Visit: Payer: Self-pay

## 2022-03-04 ENCOUNTER — Encounter (HOSPITAL_COMMUNITY): Payer: Self-pay | Admitting: Emergency Medicine

## 2022-03-04 ENCOUNTER — Emergency Department (HOSPITAL_COMMUNITY): Payer: Medicare Other

## 2022-03-04 DIAGNOSIS — Z8572 Personal history of non-Hodgkin lymphomas: Secondary | ICD-10-CM | POA: Insufficient documentation

## 2022-03-04 DIAGNOSIS — Z7982 Long term (current) use of aspirin: Secondary | ICD-10-CM | POA: Insufficient documentation

## 2022-03-04 DIAGNOSIS — I10 Essential (primary) hypertension: Secondary | ICD-10-CM | POA: Insufficient documentation

## 2022-03-04 DIAGNOSIS — J069 Acute upper respiratory infection, unspecified: Secondary | ICD-10-CM | POA: Diagnosis not present

## 2022-03-04 DIAGNOSIS — R059 Cough, unspecified: Secondary | ICD-10-CM | POA: Diagnosis present

## 2022-03-04 DIAGNOSIS — I2510 Atherosclerotic heart disease of native coronary artery without angina pectoris: Secondary | ICD-10-CM | POA: Insufficient documentation

## 2022-03-04 DIAGNOSIS — Z20822 Contact with and (suspected) exposure to covid-19: Secondary | ICD-10-CM | POA: Diagnosis not present

## 2022-03-04 LAB — RESP PANEL BY RT-PCR (RSV, FLU A&B, COVID)  RVPGX2
Influenza A by PCR: NEGATIVE
Influenza B by PCR: NEGATIVE
Resp Syncytial Virus by PCR: NEGATIVE
SARS Coronavirus 2 by RT PCR: NEGATIVE

## 2022-03-04 MED ORDER — CETIRIZINE HCL 5 MG/5ML PO SOLN
5.0000 mg | Freq: Once | ORAL | Status: AC
  Administered 2022-03-04: 5 mg via ORAL
  Filled 2022-03-04: qty 5

## 2022-03-04 MED ORDER — ACETAMINOPHEN 325 MG PO TABS
650.0000 mg | ORAL_TABLET | Freq: Once | ORAL | Status: AC
Start: 2022-03-04 — End: 2022-03-04
  Administered 2022-03-04: 650 mg via ORAL
  Filled 2022-03-04: qty 2

## 2022-03-04 MED ORDER — BENZONATATE 100 MG PO CAPS: 100.0000 mg | ORAL_CAPSULE | Freq: Three times a day (TID) | ORAL | 0 refills | Status: AC

## 2022-03-04 MED ORDER — BENZONATATE 100 MG PO CAPS
100.0000 mg | ORAL_CAPSULE | Freq: Once | ORAL | Status: AC
Start: 2022-03-04 — End: 2022-03-04
  Administered 2022-03-04: 100 mg via ORAL
  Filled 2022-03-04: qty 1

## 2022-03-04 NOTE — ED Provider Notes (Signed)
[facility] EMERGENCY DEPARTMENT Provider Note   CSN: [id number] Arrival date & time: 03/04/22  1550     History  Chief Complaint  Patient presents with   Cough    [name] is a 66 y.o. male.   Cough   66 year old male presents emergency department with complaints of nasal congestion, chills, cough for the past 3 to 4 days.  Describes cough as nonproductive in nature.  Patient is taking no medication for this.  Denies any known sick contacts.  Patient with history of stage IV B-cell lymphoma was treated with chemotherapy completed on August 2021.  Denies known fever, chest pain, shortness of breath, sore throat, abdominal pain, nausea, vomiting, urinary symptoms, change in bowel habits.  Past medical history significant for CVA, CAD, B-cell lymphoma, hypertension  Home Medications Prior to Admission medications   Medication Sig Start Date End Date Taking? Authorizing Provider  benzonatate (TESSALON) 100 MG capsule Take 1 capsule (100 mg total) by mouth every 8 (eight) hours. 03/04/22  Yes Dion Saucier A, PA  aspirin EC 81 MG tablet Take 1 tablet (81 mg total) by mouth daily. 03/02/21   Mercy Riding, MD  rosuvastatin (CRESTOR) 20 MG tablet Take 1 tablet (20 mg total) by mouth daily. 03/02/21   Mercy Riding, MD      Allergies    Patient has no known allergies.    Review of Systems   Review of Systems  Respiratory:  Positive for cough.   All other systems reviewed and are negative.   Physical Exam Updated Vital Signs BP 123/73   Pulse 73   Temp 98 F (36.7 C) (Oral)   Resp 16   Ht 5\' 7"  (1.702 m)   Wt 54.4 kg   SpO2 98%   BMI 18.79 kg/m  Physical Exam Vitals and nursing note reviewed.  Constitutional:      General: He is not in acute distress.    Appearance: He is well-developed.  HENT:     Head: Normocephalic and atraumatic.     Nose: Congestion and rhinorrhea present.  Eyes:     Conjunctiva/sclera: Conjunctivae normal.   Cardiovascular:     Rate and Rhythm: Normal rate and regular rhythm.     Heart sounds: No murmur heard. Pulmonary:     Effort: Pulmonary effort is normal. No respiratory distress.     Breath sounds: Normal breath sounds. No stridor. No wheezing, rhonchi or rales.  Abdominal:     Palpations: Abdomen is soft.     Tenderness: There is no abdominal tenderness.  Musculoskeletal:        General: No swelling.     Cervical back: Neck supple.     Right lower leg: No edema.     Left lower leg: No edema.  Skin:    General: Skin is warm and dry.     Capillary Refill: Capillary refill takes less than 2 seconds.  Neurological:     Mental Status: He is alert.  Psychiatric:        Mood and Affect: Mood normal.     ED Results / Procedures / Treatments   Labs (all labs ordered are listed, but only abnormal results are displayed) Labs Reviewed  RESP PANEL BY RT-PCR (RSV, FLU A&B, COVID)  [id number]    EKG None  Radiology DG Chest 2 View  Result Date: 03/04/2022 CLINICAL DATA:  Cough, runny nose EXAM: CHEST - 2 VIEW COMPARISON:  05/02/2020 FINDINGS: Transverse diameter of heart  is slightly increased. There is previous coronary bypass surgery. Tip of right IJ chest port is seen at the junction of superior vena cava and right atrium. There are no signs of alveolar pulmonary edema or focal pulmonary consolidation. There is no significant pleural effusion or pneumothorax. IMPRESSION: There are no signs of pulmonary edema or focal pulmonary consolidation. Electronically Signed   By: Elmer Picker M.D.   On: 03/04/2022 17:11    Procedures Procedures    Medications Ordered in ED Medications  benzonatate (TESSALON) capsule 100 mg (has no administration in time range)  acetaminophen (TYLENOL) tablet 650 mg (650 mg Oral Given 03/04/22 1713)  cetirizine HCl (Zyrtec) 5 MG/5ML solution 5 mg (5 mg Oral Given 03/04/22 1713)    ED Course/ Medical Decision Making/ A&P                            Medical Decision Making  This patient presents to the ED for concern of cough, congestion, this involves an extensive number of treatment options, and is a complaint that carries with it a high risk of complications and morbidity.  The differential diagnosis includes malignancy, COVID, influenza, viral URI, pneumonia, CHF exacerbation, COPD/asthma exacerbation   Co morbidities that complicate the patient evaluation  See HPI   Additional history obtained:  Additional history obtained from EMR External records from outside source obtained and reviewed including hospital records   Lab Tests:  I Ordered, and personally interpreted labs.  The pertinent results include: Respiratory viral panel negative.   Imaging Studies ordered:  I ordered imaging studies including chest x-ray I independently visualized and interpreted imaging which showed no acute signs of cardiopulmonary abnormalities. I agree with the radiologist interpretation   Cardiac Monitoring: / EKG:  The patient was maintained on a cardiac monitor.  I personally viewed and interpreted the cardiac monitored which showed an underlying rhythm of: Sinus rhythm   Consultations Obtained:  Discussed case with attending physician Dr. Alvino Chapel who is in agreement with treatment plan going forward.   Problem List / ED Course / Critical interventions / Medication management  Viral URI with cough I ordered medication including cetirizine, Tylenol, benzonatate   Reevaluation of the patient after these medicines showed that the patient improved I have reviewed the patients home medicines and have made adjustments as needed   Social Determinants of Health:  Reports some cigarette use.  Denies illicit drug use.   Test / Admission - Considered:  Viral URI with cough Vitals signs within normal range and stable throughout visit. Laboratory/imaging studies significant for: See above Patient's symptoms likely secondary to  viral URI with cough.  Patient has history of large B-cell lymphoma with reassuring prior CT thoracic studies since treatment of B-cell lymphoma.  Patient with negative respiratory viral panel as well as negative chest x-ray today.  Symptoms most likely secondary to viral URI with cough.  Discussed case with attending physician Dr. Alvino Chapel given that patient has history of B-cell lymphoma and he was in agreement to repeat CT imaging at this time and was deemed unnecessary.  Symptomatic therapy recommended at home with Tylenol/Motrin as needed for myalgias/fever, steroid nasal spray, daily antihistamine as well as cough suppressant daily as needed.  Recommend follow-up with primary care in 3 to 5 days for reevaluation of symptoms.  Treatment plan discussed with the patient and he acknowledged understanding was agreeable to said plan. Worrisome signs and symptoms were discussed with the patient, and  the patient acknowledged understanding to return to the ED if noticed. Patient was stable upon discharge.          Final Clinical Impression(s) / ED Diagnoses Final diagnoses:  Viral URI with cough    Rx / DC Orders ED Discharge Orders          Ordered    benzonatate (TESSALON) 100 MG capsule  Every 8 hours        03/04/22 1854              Wilnette Kales, [place] 03/04/22 1901    Davonna Belling, MD 03/04/22 2311

## 2022-03-04 NOTE — ED Triage Notes (Addendum)
Pt states he has had a cough and runny nose for the past 3 days. Denies chest pain or shortness of breath.

## 2022-03-04 NOTE — ED Provider Triage Note (Signed)
Emergency Medicine Provider Triage Evaluation Note  [name] , a 66 y.o. male  was evaluated in triage.  Pt complains of cough and congestion x 4 days, chills without fever. Cough is non productive. Hx lymphoma, not on chemo per pt, has port. Review of Systems  Positive: As above Negative: As above  Physical Exam  BP 123/73   Pulse 73   Temp 98 F (36.7 C) (Oral)   Resp 16   Ht 5\' 7"  (1.702 m)   Wt 54.4 kg   SpO2 98%   BMI 18.79 kg/m  Gen:   Awake, no distress   Resp:  Normal effort  MSK:   Moves extremities without difficulty  Other:    Medical Decision Making  Medically screening exam initiated at 4:26 PM.  Appropriate orders placed.  [name] was informed that the remainder of the evaluation will be completed by another provider, this initial triage assessment does not replace that evaluation, and the importance of remaining in the ED until their evaluation is complete.     Tacy Learn, PA-C 03/04/22 1627

## 2022-03-04 NOTE — Discharge Instructions (Addendum)
Note the workup today was overall reassuring.  As discussed, symptoms most likely secondary to virus.  He tested negative for COVID, influenza and RSV.  Recommend symptomatic therapy at home with Tylenol/Motrin as needed for fever/body aches.  Recommend daily antihistamine form of Zyrtec or Allegra.  Also recommended nasal steroid spray in the form of Nasacort or Flonase.  You can find these medications over-the-counter at your local pharmacy.  I will also prescribe a cough suppressant called Tessalon Perles or benzonatate to take as needed for cough.  Please not hesitate to return to emergency department if the worrisome signs and symptoms we discussed become apparent.

## 2022-04-11 ENCOUNTER — Other Ambulatory Visit: Payer: Self-pay | Admitting: Medical Oncology

## 2022-04-11 ENCOUNTER — Inpatient Hospital Stay: Payer: Medicare Other | Attending: Internal Medicine

## 2022-04-11 ENCOUNTER — Inpatient Hospital Stay: Payer: Medicare Other

## 2022-04-11 VITALS — BP 110/65 | HR 62 | Temp 97.9°F | Resp 18

## 2022-04-11 DIAGNOSIS — C8338 Diffuse large B-cell lymphoma, lymph nodes of multiple sites: Secondary | ICD-10-CM

## 2022-04-11 DIAGNOSIS — Z452 Encounter for adjustment and management of vascular access device: Secondary | ICD-10-CM | POA: Insufficient documentation

## 2022-04-11 DIAGNOSIS — C8253 Diffuse follicle center lymphoma, intra-abdominal lymph nodes: Secondary | ICD-10-CM | POA: Insufficient documentation

## 2022-04-11 DIAGNOSIS — Z95828 Presence of other vascular implants and grafts: Secondary | ICD-10-CM

## 2022-04-11 MED ORDER — HEPARIN SOD (PORK) LOCK FLUSH 100 UNIT/ML IV SOLN
500.0000 [IU] | Freq: Once | INTRAVENOUS | Status: AC
  Administered 2022-04-11: 500 [IU]

## 2022-04-11 MED ORDER — LIDOCAINE-PRILOCAINE 2.5-2.5 % EX CREA: 1.0000 | TOPICAL_CREAM | CUTANEOUS | 0 refills | Status: DC | PRN

## 2022-04-11 MED ORDER — SODIUM CHLORIDE 0.9% FLUSH
10.0000 mL | Freq: Once | INTRAVENOUS | Status: AC
  Administered 2022-04-11: 10 mL

## 2022-04-11 NOTE — Patient Instructions (Signed)

## 2022-04-25 ENCOUNTER — Telehealth: Payer: Self-pay

## 2022-04-25 NOTE — Telephone Encounter (Signed)
Notified Patient by voicemail of prior authorization approval for Lidocaine-Prilocaine 2.5% Cream (EMLA Cream). Medication is approved through 07/23/2022.

## 2022-06-06 ENCOUNTER — Ambulatory Visit (HOSPITAL_COMMUNITY)
Admission: RE | Admit: 2022-06-06 | Discharge: 2022-06-06 | Disposition: A | Discharge status: Home / Self Care | Payer: Medicare Other | Source: Ambulatory Visit | Attending: Internal Medicine | Admitting: Internal Medicine

## 2022-06-06 ENCOUNTER — Inpatient Hospital Stay: Payer: Medicare Other | Attending: Internal Medicine

## 2022-06-06 ENCOUNTER — Other Ambulatory Visit: Payer: Medicare Other

## 2022-06-06 ENCOUNTER — Other Ambulatory Visit: Payer: Self-pay

## 2022-06-06 ENCOUNTER — Encounter (HOSPITAL_COMMUNITY): Payer: Self-pay

## 2022-06-06 DIAGNOSIS — C8338 Diffuse large B-cell lymphoma, lymph nodes of multiple sites: Secondary | ICD-10-CM

## 2022-06-06 DIAGNOSIS — Z452 Encounter for adjustment and management of vascular access device: Secondary | ICD-10-CM | POA: Insufficient documentation

## 2022-06-06 DIAGNOSIS — C8253 Diffuse follicle center lymphoma, intra-abdominal lymph nodes: Secondary | ICD-10-CM | POA: Insufficient documentation

## 2022-06-06 DIAGNOSIS — C8333 Diffuse large B-cell lymphoma, intra-abdominal lymph nodes: Secondary | ICD-10-CM

## 2022-06-06 DIAGNOSIS — Z95828 Presence of other vascular implants and grafts: Secondary | ICD-10-CM

## 2022-06-06 LAB — CMP (CANCER CENTER ONLY)
ALT: 13 U/L (ref 0–44)
AST: 14 U/L — ABNORMAL LOW (ref 15–41)
Albumin: 3.6 g/dL (ref 3.5–5.0)
Alkaline Phosphatase: 61 U/L (ref 38–126)
Anion gap: 3 — ABNORMAL LOW (ref 5–15)
BUN: 12 mg/dL (ref 8–23)
CO2: 28 mmol/L (ref 22–32)
Calcium: 8.4 mg/dL — ABNORMAL LOW (ref 8.9–10.3)
Chloride: 108 mmol/L (ref 98–111)
Creatinine: 0.75 mg/dL (ref 0.61–1.24)
GFR, Estimated: >60 mL/min (ref >60)
Glucose, Bld: 83 mg/dL (ref 70–99)
Potassium: 3.7 mmol/L (ref 3.5–5.1)
Sodium: 139 mmol/L (ref 135–145)
Total Bilirubin: 0.3 mg/dL (ref 0.3–1.2)
Total Protein: 6.2 g/dL — ABNORMAL LOW (ref 6.5–8.1)

## 2022-06-06 LAB — CBC WITH DIFFERENTIAL (CANCER CENTER ONLY)
Abs Immature Granulocytes: 0.01 10*3/uL (ref 0.00–0.07)
Basophils Absolute: 0 10*3/uL (ref 0.0–0.1)
Basophils Relative: 1 %
Eosinophils Absolute: 0.1 10*3/uL (ref 0.0–0.5)
Eosinophils Relative: 2 %
HCT: 37.7 % — ABNORMAL LOW (ref 39.0–52.0)
Hemoglobin: 12.8 g/dL — ABNORMAL LOW (ref 13.0–17.0)
Immature Granulocytes: 0 %
Lymphocytes Relative: 37 %
Lymphs Abs: 1 10*3/uL (ref 0.7–4.0)
MCH: 32.9 pg (ref 26.0–34.0)
MCHC: 34 g/dL (ref 30.0–36.0)
MCV: 96.9 fL (ref 80.0–100.0)
Monocytes Absolute: 0.2 10*3/uL (ref 0.1–1.0)
Monocytes Relative: 7 %
Neutro Abs: 1.4 10*3/uL — ABNORMAL LOW (ref 1.7–7.7)
Neutrophils Relative %: 53 %
Platelet Count: 194 10*3/uL (ref 150–400)
RBC: 3.89 MIL/uL — ABNORMAL LOW (ref 4.22–5.81)
RDW: 13.3 % (ref 11.5–15.5)
WBC Count: 2.7 10*3/uL — ABNORMAL LOW (ref 4.0–10.5)
nRBC: 0 % (ref 0.0–0.2)

## 2022-06-06 LAB — LACTATE DEHYDROGENASE: LDH: 124 U/L (ref 98–192)

## 2022-06-06 MED ORDER — SODIUM CHLORIDE (PF) 0.9 % IJ SOLN
INTRAMUSCULAR | Status: AC
  Filled 2022-06-06: qty 50

## 2022-06-06 MED ORDER — HEPARIN SOD (PORK) LOCK FLUSH 100 UNIT/ML IV SOLN: 500.0000 [IU] | Freq: Once | INTRAVENOUS | Status: AC

## 2022-06-06 MED ORDER — SODIUM CHLORIDE 0.9% FLUSH
10.0000 mL | Freq: Once | INTRAVENOUS | Status: AC
  Administered 2022-06-06: 10 mL

## 2022-06-06 MED ORDER — IOHEXOL 9 MG/ML PO SOLN
500.0000 mL | ORAL | Status: AC
  Administered 2022-06-06 (×2): 500 mL via ORAL

## 2022-06-06 MED ORDER — HEPARIN SOD (PORK) LOCK FLUSH 100 UNIT/ML IV SOLN
INTRAVENOUS | Status: AC
  Administered 2022-06-06: 500 [IU] via INTRAVENOUS
  Filled 2022-06-06: qty 5

## 2022-06-06 MED ORDER — IOHEXOL 9 MG/ML PO SOLN
ORAL | Status: AC
  Filled 2022-06-06: qty 1000

## 2022-06-06 MED ORDER — IOHEXOL 300 MG/ML  SOLN
75.0000 mL | Freq: Once | INTRAMUSCULAR | Status: AC | PRN
  Administered 2022-06-06: 75 mL via INTRAVENOUS

## 2022-06-08 ENCOUNTER — Inpatient Hospital Stay: Payer: Medicare Other | Admitting: Internal Medicine

## 2022-06-09 ENCOUNTER — Telehealth: Payer: Self-pay | Admitting: Internal Medicine

## 2022-06-09 NOTE — Telephone Encounter (Signed)
Per 3/15 IB reached out to patient to schedule, patient requested I call his niece as well , left voicemail on her phone.

## 2022-06-12 ENCOUNTER — Telehealth: Payer: Self-pay | Admitting: Internal Medicine

## 2022-06-12 NOTE — Telephone Encounter (Signed)
Per 3/ IB reached out to patient to schedule. Left voicemail.

## 2022-06-16 ENCOUNTER — Other Ambulatory Visit: Payer: Self-pay | Admitting: Physician Assistant

## 2022-06-16 DIAGNOSIS — R918 Other nonspecific abnormal finding of lung field: Secondary | ICD-10-CM

## 2022-06-20 ENCOUNTER — Inpatient Hospital Stay: Payer: Medicare Other | Admitting: Internal Medicine

## 2022-06-20 ENCOUNTER — Inpatient Hospital Stay: Payer: Medicare Other

## 2022-07-17 ENCOUNTER — Telehealth: Payer: Self-pay | Admitting: Internal Medicine

## 2022-07-17 NOTE — Telephone Encounter (Signed)
Patient called to reschedule his missed appointments.

## 2022-07-19 ENCOUNTER — Other Ambulatory Visit: Payer: Self-pay

## 2022-07-19 DIAGNOSIS — R918 Other nonspecific abnormal finding of lung field: Secondary | ICD-10-CM

## 2022-07-20 ENCOUNTER — Telehealth: Payer: Self-pay | Admitting: Medical Oncology

## 2022-07-20 ENCOUNTER — Inpatient Hospital Stay: Payer: Medicare Other | Attending: Internal Medicine

## 2022-07-20 ENCOUNTER — Other Ambulatory Visit: Payer: Self-pay

## 2022-07-20 ENCOUNTER — Inpatient Hospital Stay (HOSPITAL_BASED_OUTPATIENT_CLINIC_OR_DEPARTMENT_OTHER): Payer: Medicare Other | Admitting: Internal Medicine

## 2022-07-20 VITALS — BP 131/78 | HR 73 | Temp 97.9°F | Resp 18 | Ht 67.0 in | Wt 113.5 lb

## 2022-07-20 DIAGNOSIS — R590 Localized enlarged lymph nodes: Secondary | ICD-10-CM

## 2022-07-20 DIAGNOSIS — C8253 Diffuse follicle center lymphoma, intra-abdominal lymph nodes: Secondary | ICD-10-CM | POA: Diagnosis present

## 2022-07-20 DIAGNOSIS — R634 Abnormal weight loss: Secondary | ICD-10-CM | POA: Diagnosis not present

## 2022-07-20 DIAGNOSIS — D649 Anemia, unspecified: Secondary | ICD-10-CM | POA: Insufficient documentation

## 2022-07-20 DIAGNOSIS — Z8673 Personal history of transient ischemic attack (TIA), and cerebral infarction without residual deficits: Secondary | ICD-10-CM | POA: Insufficient documentation

## 2022-07-20 DIAGNOSIS — R5383 Other fatigue: Secondary | ICD-10-CM | POA: Insufficient documentation

## 2022-07-20 DIAGNOSIS — R918 Other nonspecific abnormal finding of lung field: Secondary | ICD-10-CM | POA: Insufficient documentation

## 2022-07-20 DIAGNOSIS — D72819 Decreased white blood cell count, unspecified: Secondary | ICD-10-CM | POA: Insufficient documentation

## 2022-07-20 DIAGNOSIS — Z79899 Other long term (current) drug therapy: Secondary | ICD-10-CM | POA: Insufficient documentation

## 2022-07-20 DIAGNOSIS — I2510 Atherosclerotic heart disease of native coronary artery without angina pectoris: Secondary | ICD-10-CM | POA: Diagnosis not present

## 2022-07-20 LAB — CBC WITH DIFFERENTIAL/PLATELET
Abs Immature Granulocytes: 0.01 10*3/uL (ref 0.00–0.07)
Basophils Absolute: 0 10*3/uL (ref 0.0–0.1)
Basophils Relative: 1 %
Eosinophils Absolute: 0.1 10*3/uL (ref 0.0–0.5)
Eosinophils Relative: 2 %
HCT: 42.6 % (ref 39.0–52.0)
Hemoglobin: 14.2 g/dL (ref 13.0–17.0)
Immature Granulocytes: 0 %
Lymphocytes Relative: 36 %
Lymphs Abs: 1 10*3/uL (ref 0.7–4.0)
MCH: 32.8 pg (ref 26.0–34.0)
MCHC: 33.3 g/dL (ref 30.0–36.0)
MCV: 98.4 fL (ref 80.0–100.0)
Monocytes Absolute: 0.3 10*3/uL (ref 0.1–1.0)
Monocytes Relative: 9 %
Neutro Abs: 1.5 10*3/uL — ABNORMAL LOW (ref 1.7–7.7)
Neutrophils Relative %: 52 %
Platelets: 159 10*3/uL (ref 150–400)
RBC: 4.33 MIL/uL (ref 4.22–5.81)
RDW: 13.4 % (ref 11.5–15.5)
WBC: 2.8 10*3/uL — ABNORMAL LOW (ref 4.0–10.5)
nRBC: 0 % (ref 0.0–0.2)

## 2022-07-20 LAB — COMPREHENSIVE METABOLIC PANEL
ALT: 8 U/L (ref 0–44)
AST: 11 U/L — ABNORMAL LOW (ref 15–41)
Albumin: 3.9 g/dL (ref 3.5–5.0)
Alkaline Phosphatase: 59 U/L (ref 38–126)
Anion gap: 4 — ABNORMAL LOW (ref 5–15)
BUN: 14 mg/dL (ref 8–23)
CO2: 29 mmol/L (ref 22–32)
Calcium: 9.1 mg/dL (ref 8.9–10.3)
Chloride: 106 mmol/L (ref 98–111)
Creatinine, Ser: 1.02 mg/dL (ref 0.61–1.24)
GFR, Estimated: >60 mL/min (ref >60)
Glucose, Bld: 105 mg/dL — ABNORMAL HIGH (ref 70–99)
Potassium: 3.7 mmol/L (ref 3.5–5.1)
Sodium: 139 mmol/L (ref 135–145)
Total Bilirubin: 0.5 mg/dL (ref 0.3–1.2)
Total Protein: 6.7 g/dL (ref 6.5–8.1)

## 2022-07-20 NOTE — Progress Notes (Signed)
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    PRIOR THERAPY: Systemic chemotherapy with R-CHOP every 3 weeks.  First dose May 20, 2019.  Status post 8 cycles.  Last dose was given October 14, 2019.  CURRENT THERAPY: Observation.  INTERVAL HISTORY: [name] 67 y.o. male returns to clinic today for 6 months follow-up visit.  The patient is feeling fine today with no concerning complaints except for fatigue.  He denied having any current chest pain, shortness of breath, cough or hemoptysis.  He has no nausea, vomiting, diarrhea or constipation.  He has no headache or visual changes.  He lost few pounds since his last visit.  He stays home most of the time watching NBA games.  The patient is here today for evaluation and repeat blood work.   MEDICAL HISTORY: Past Medical History:  Diagnosis Date   CAD (coronary artery disease)    a. 04/2008 Cath: LM nl, LAD 50p, 75m, LCX min irregs, RCA 50p/m, 40d, RPDA 60-70ost; b. 04/2008 CABG x 4: LIMA->LAD, VG->D2, VG->RPDA->RPL.   Cancer (HCC)    Cocaine abuse (HCC)    a. Quit early 2019.   Ischemic cardiomyopathy    a. LV gram: EF 30-35%, apical/periapical AK.   Stroke [facility]) 02/2019   Stroke (HCC) 02/2019   Tobacco abuse     ALLERGIES:  has No Known Allergies.  MEDICATIONS:  Current Outpatient Medications  Medication Sig Dispense Refill   aspirin EC 81 MG tablet Take 1 tablet (81 mg total) by mouth daily. 30 tablet 3   benzonatate (TESSALON) 100 MG  capsule Take 1 capsule (100 mg total) by mouth every 8 (eight) hours. 21 capsule 0   lidocaine-prilocaine (EMLA) cream Apply 1 Application topically as needed. Apply one application 1 hours prior to lab appointment. Cover with plastic wrap. 30 g 0   rosuvastatin (CRESTOR) 20 MG tablet Take 1 tablet (20 mg total) by mouth daily. 90 tablet 3   No current facility-administered medications for this visit.   Facility-Administered Medications Ordered in Other Visits  Medication Dose Route Frequency Provider Last Rate Last Admin   heparin lock flush 100 unit/mL  500 Units Intracatheter Once Si Gaul, MD       heparin lock flush 100 unit/mL  500 Units Intracatheter Once Si Gaul, MD       sodium chloride flush (NS) 0.9 % injection 10 mL  10 mL Intracatheter Once Si Gaul, MD       sodium chloride flush (NS) 0.9 % injection 10 mL  10 mL Intracatheter Once Si Gaul, MD        SURGICAL HISTORY:  Past Surgical History:  Procedure Laterality Date   BYPASS GRAFT  2010   CARDIAC SURGERY  2010   IR IMAGING GUIDED PORT INSERTION  05/26/2019   SUPRACLAVICAL NODE BIOPSY Left 05/08/2019   Procedure: SUPRACLAVICAL NODE BIOPSY;  Surgeon: Loreli Slot, MD;  Location: [facility] OR;  Service: Thoracic;  Laterality: Left;    REVIEW OF SYSTEMS:  A comprehensive review of  systems was negative except for: Constitutional: positive for fatigue and weight loss   PHYSICAL EXAMINATION: General appearance: alert, cooperative, fatigued, and no distress Head: Normocephalic, without obvious abnormality, atraumatic Neck: no adenopathy, no JVD, supple, symmetrical, trachea midline, and thyroid not enlarged, symmetric, no tenderness/mass/nodules Lymph nodes: Cervical, supraclavicular, and axillary nodes normal. Resp: clear to auscultation bilaterally Back: symmetric, no curvature. ROM normal. No CVA tenderness. Cardio: regular rate and rhythm, S1, S2 normal, no murmur, click, rub or gallop GI:  soft, non-tender; bowel sounds normal; no masses,  no organomegaly Extremities: extremities normal, atraumatic, no cyanosis or edema  ECOG PERFORMANCE STATUS: 1 - Symptomatic but completely ambulatory  Blood pressure 131/78, pulse 73, temperature 97.9 F (36.6 C), temperature source Temporal, resp. rate 18, height  (1.702 m), weight 113 lb 8 oz (51.5 kg), SpO2 97 %.  LABORATORY DATA: Lab Results  Component Value Date   WBC 2.7 (L) 06/06/2022   HGB 12.8 (L) 06/06/2022   HCT 37.7 (L) 06/06/2022   MCV 96.9 06/06/2022   PLT 194 06/06/2022      Chemistry      Component Value Date/Time   NA 139 06/06/2022 0953   K 3.7 06/06/2022 0953   CL 108 06/06/2022 0953   CO2 28 06/06/2022 0953   BUN 12 06/06/2022 0953   CREATININE 0.75 06/06/2022 0953      Component Value Date/Time   CALCIUM 8.4 (L) 06/06/2022 0953   ALKPHOS 61 06/06/2022 0953   AST 14 (L) 06/06/2022 0953   ALT 13 06/06/2022 0953   BILITOT 0.3 06/06/2022 0953       RADIOGRAPHIC STUDIES: No results found.   ASSESSMENT AND PLAN: This is a 67  years old African-American male recently diagnosed with 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  The patient completed course of systemic chemotherapy with R-CHOP status post 8 cycles.  He tolerated his treatment fairly well with no significant adverse effects. The patient has been on observation since August 2021 and he is feeling fine with no concerning complaints except for mild fatigue. The patient is currently on observation. His last CT scan of the chest, abdomen and pelvis performed on June 06, 2022 showed no concerning findings for disease recurrence or progression. His lab work today showed persistent mild leukocytopenia but there was improvement  of his anemia. I recommended for the patient to continue on observation with repeat blood work in 6 months. I will refer the patient to IR for removal of the Port-A-Cath. The patient was advised to call immediately if he has any other concerning symptoms in the interval. The patient voices understanding of current disease status and treatment options and is in agreement with the current care plan.  All questions were answered. The patient knows to call the clinic with any problems, questions or concerns. We can certainly see the patient much sooner if necessary.   Disclaimer: This note was dictated with voice recognition software. Similar sounding words can inadvertently be transcribed and may not be corrected upon review.

## 2022-07-20 NOTE — Telephone Encounter (Signed)
Pt will be late because of [facility] transportation.

## 2022-07-26 ENCOUNTER — Ambulatory Visit (HOSPITAL_COMMUNITY)
Admission: RE | Admit: 2022-07-26 | Discharge: 2022-07-26 | Disposition: A | Discharge status: Home / Self Care | Payer: Medicare Other | Source: Ambulatory Visit | Attending: Internal Medicine | Admitting: Internal Medicine

## 2022-07-26 ENCOUNTER — Inpatient Hospital Stay: Payer: Medicare Other | Attending: Internal Medicine

## 2022-07-26 ENCOUNTER — Telehealth (HOSPITAL_COMMUNITY): Payer: Self-pay | Admitting: *Deleted

## 2022-07-26 DIAGNOSIS — Z8572 Personal history of non-Hodgkin lymphomas: Secondary | ICD-10-CM | POA: Diagnosis not present

## 2022-07-26 DIAGNOSIS — Z452 Encounter for adjustment and management of vascular access device: Secondary | ICD-10-CM | POA: Diagnosis not present

## 2022-07-26 DIAGNOSIS — R590 Localized enlarged lymph nodes: Secondary | ICD-10-CM

## 2022-07-26 HISTORY — PX: IR REMOVAL TUN ACCESS W/ PORT W/O FL MOD SED: IMG2290

## 2022-07-26 MED ORDER — LIDOCAINE-EPINEPHRINE 1 %-1:100000 IJ SOLN
INTRAMUSCULAR | Status: AC
  Filled 2022-07-26: qty 1

## 2022-07-26 MED ORDER — LIDOCAINE-EPINEPHRINE 1 %-1:100000 IJ SOLN
20.0000 mL | Freq: Once | INTRAMUSCULAR | Status: AC
  Administered 2022-07-26: 10 mL via INTRADERMAL

## 2022-07-26 NOTE — Procedures (Signed)
Interventional Radiology Procedure:   Indications: Port is no longer needed.  Procedure: Port removal  Findings:  Complete removal of right chest port  Complications: None     EBL: Minimal, less than 10 ml  Plan: Keep port site and incisions dry for at least 24 hours.     Thelbert Gartin R. Lowella Dandy, MD  Pager: [phone]

## 2022-08-07 ENCOUNTER — Telehealth: Payer: Self-pay | Admitting: Medical Oncology

## 2022-08-07 NOTE — Telephone Encounter (Signed)
Cancel all port flushes -porta a cath removed 07/26/2022.

## 2022-08-14 ENCOUNTER — Emergency Department (HOSPITAL_COMMUNITY): Payer: Medicare Other | Admitting: Certified Registered Nurse Anesthetist

## 2022-08-14 ENCOUNTER — Emergency Department (HOSPITAL_COMMUNITY): Payer: Medicare Other

## 2022-08-14 ENCOUNTER — Encounter (HOSPITAL_COMMUNITY): Payer: Self-pay | Admitting: Certified Registered Nurse Anesthetist

## 2022-08-14 ENCOUNTER — Inpatient Hospital Stay (HOSPITAL_COMMUNITY): Payer: Medicare Other

## 2022-08-14 ENCOUNTER — Encounter (HOSPITAL_COMMUNITY)
Admission: EM | Disposition: A | Discharge status: Home Health | Payer: Self-pay | Source: Home / Self Care | Attending: Neurology

## 2022-08-14 ENCOUNTER — Inpatient Hospital Stay (HOSPITAL_COMMUNITY)
Admission: EM | Admit: 2022-08-14 | Discharge: 2022-08-17 | DRG: 023 | Disposition: A | Discharge status: Home Health | Payer: Medicare Other | Attending: Neurology | Admitting: Neurology

## 2022-08-14 ENCOUNTER — Other Ambulatory Visit: Payer: Self-pay

## 2022-08-14 DIAGNOSIS — F1410 Cocaine abuse, uncomplicated: Secondary | ICD-10-CM | POA: Diagnosis present

## 2022-08-14 DIAGNOSIS — I6300 Cerebral infarction due to thrombosis of unspecified precerebral artery: Secondary | ICD-10-CM | POA: Diagnosis not present

## 2022-08-14 DIAGNOSIS — I472 Ventricular tachycardia, unspecified: Secondary | ICD-10-CM | POA: Diagnosis not present

## 2022-08-14 DIAGNOSIS — I429 Cardiomyopathy, unspecified: Secondary | ICD-10-CM | POA: Diagnosis present

## 2022-08-14 DIAGNOSIS — R54 Age-related physical debility: Secondary | ICD-10-CM | POA: Diagnosis present

## 2022-08-14 DIAGNOSIS — C8330 Diffuse large B-cell lymphoma, unspecified site: Secondary | ICD-10-CM | POA: Diagnosis present

## 2022-08-14 DIAGNOSIS — I5023 Acute on chronic systolic (congestive) heart failure: Secondary | ICD-10-CM | POA: Diagnosis present

## 2022-08-14 DIAGNOSIS — Z951 Presence of aortocoronary bypass graft: Secondary | ICD-10-CM

## 2022-08-14 DIAGNOSIS — I69334 Monoplegia of upper limb following cerebral infarction affecting left non-dominant side: Secondary | ICD-10-CM | POA: Diagnosis not present

## 2022-08-14 DIAGNOSIS — I63512 Cerebral infarction due to unspecified occlusion or stenosis of left middle cerebral artery: Secondary | ICD-10-CM | POA: Diagnosis not present

## 2022-08-14 DIAGNOSIS — I6602 Occlusion and stenosis of left middle cerebral artery: Secondary | ICD-10-CM | POA: Diagnosis present

## 2022-08-14 DIAGNOSIS — Z681 Body mass index (BMI) 19 or less, adult: Secondary | ICD-10-CM | POA: Diagnosis not present

## 2022-08-14 DIAGNOSIS — R0689 Other abnormalities of breathing: Secondary | ICD-10-CM | POA: Diagnosis not present

## 2022-08-14 DIAGNOSIS — I63412 Cerebral infarction due to embolism of left middle cerebral artery: Principal | ICD-10-CM | POA: Diagnosis present

## 2022-08-14 DIAGNOSIS — I255 Ischemic cardiomyopathy: Secondary | ICD-10-CM | POA: Diagnosis not present

## 2022-08-14 DIAGNOSIS — I639 Cerebral infarction, unspecified: Secondary | ICD-10-CM | POA: Diagnosis present

## 2022-08-14 DIAGNOSIS — R4701 Aphasia: Secondary | ICD-10-CM | POA: Diagnosis present

## 2022-08-14 DIAGNOSIS — R29810 Facial weakness: Secondary | ICD-10-CM | POA: Diagnosis present

## 2022-08-14 DIAGNOSIS — I63312 Cerebral infarction due to thrombosis of left middle cerebral artery: Secondary | ICD-10-CM | POA: Diagnosis not present

## 2022-08-14 DIAGNOSIS — E785 Hyperlipidemia, unspecified: Secondary | ICD-10-CM | POA: Diagnosis present

## 2022-08-14 DIAGNOSIS — I110 Hypertensive heart disease with heart failure: Secondary | ICD-10-CM | POA: Diagnosis present

## 2022-08-14 DIAGNOSIS — E1140 Type 2 diabetes mellitus with diabetic neuropathy, unspecified: Secondary | ICD-10-CM | POA: Diagnosis present

## 2022-08-14 DIAGNOSIS — F1910 Other psychoactive substance abuse, uncomplicated: Secondary | ICD-10-CM

## 2022-08-14 DIAGNOSIS — R29727 NIHSS score 27: Secondary | ICD-10-CM | POA: Diagnosis present

## 2022-08-14 DIAGNOSIS — R451 Restlessness and agitation: Secondary | ICD-10-CM | POA: Diagnosis present

## 2022-08-14 DIAGNOSIS — G9389 Other specified disorders of brain: Secondary | ICD-10-CM | POA: Diagnosis present

## 2022-08-14 DIAGNOSIS — I519 Heart disease, unspecified: Secondary | ICD-10-CM | POA: Diagnosis not present

## 2022-08-14 DIAGNOSIS — I493 Ventricular premature depolarization: Secondary | ICD-10-CM | POA: Diagnosis not present

## 2022-08-14 DIAGNOSIS — S065XAA Traumatic subdural hemorrhage with loss of consciousness status unknown, initial encounter: Secondary | ICD-10-CM

## 2022-08-14 DIAGNOSIS — I2510 Atherosclerotic heart disease of native coronary artery without angina pectoris: Secondary | ICD-10-CM | POA: Diagnosis present

## 2022-08-14 DIAGNOSIS — G8191 Hemiplegia, unspecified affecting right dominant side: Secondary | ICD-10-CM | POA: Diagnosis present

## 2022-08-14 DIAGNOSIS — R64 Cachexia: Secondary | ICD-10-CM | POA: Diagnosis present

## 2022-08-14 HISTORY — PX: IR CT HEAD LTD: IMG2386

## 2022-08-14 HISTORY — PX: IR PERCUTANEOUS ART THROMBECTOMY/INFUSION INTRACRANIAL INC DIAG ANGIO: IMG6087

## 2022-08-14 HISTORY — PX: IR ANGIO INTRA EXTRACRAN SEL COM CAROTID INNOMINATE UNI R MOD SED: IMG5359

## 2022-08-14 HISTORY — PX: RADIOLOGY WITH ANESTHESIA: SHX6223

## 2022-08-14 HISTORY — PX: IR ANGIO VERTEBRAL SEL SUBCLAVIAN INNOMINATE UNI R MOD SED: IMG5365

## 2022-08-14 LAB — COMPREHENSIVE METABOLIC PANEL
ALT: 14 U/L (ref 0–44)
AST: 16 U/L (ref 15–41)
Albumin: 3.2 g/dL — ABNORMAL LOW (ref 3.5–5.0)
Alkaline Phosphatase: 54 U/L (ref 38–126)
Anion gap: 7 (ref 5–15)
BUN: 12 mg/dL (ref 8–23)
CO2: 22 mmol/L (ref 22–32)
Calcium: 8.3 mg/dL — ABNORMAL LOW (ref 8.9–10.3)
Chloride: 105 mmol/L (ref 98–111)
Creatinine, Ser: 0.99 mg/dL (ref 0.61–1.24)
GFR, Estimated: >60 mL/min (ref >60)
Glucose, Bld: 100 mg/dL — ABNORMAL HIGH (ref 70–99)
Potassium: 3.7 mmol/L (ref 3.5–5.1)
Sodium: 134 mmol/L — ABNORMAL LOW (ref 135–145)
Total Bilirubin: 0.7 mg/dL (ref 0.3–1.2)
Total Protein: 6.1 g/dL — ABNORMAL LOW (ref 6.5–8.1)

## 2022-08-14 LAB — ECHOCARDIOGRAM COMPLETE
AR max vel: 3.4 cm2
AV Area VTI: 3.48 cm2
AV Area mean vel: 3.22 cm2
AV Mean grad: 1 mmHg
AV Peak grad: 2.2 mmHg
Ao pk vel: 0.74 m/s
Area-P 1/2: 6.07 cm2
Height: 66 in
S' Lateral: 4.7 cm
Weight: 1760.15 oz

## 2022-08-14 LAB — DIFFERENTIAL
Abs Immature Granulocytes: 0.01 10*3/uL (ref 0.00–0.07)
Basophils Absolute: 0 10*3/uL (ref 0.0–0.1)
Basophils Relative: 1 %
Eosinophils Absolute: 0.1 10*3/uL (ref 0.0–0.5)
Eosinophils Relative: 2 %
Immature Granulocytes: 0 %
Lymphocytes Relative: 40 %
Lymphs Abs: 1.5 10*3/uL (ref 0.7–4.0)
Monocytes Absolute: 0.3 10*3/uL (ref 0.1–1.0)
Monocytes Relative: 9 %
Neutro Abs: 1.8 10*3/uL (ref 1.7–7.7)
Neutrophils Relative %: 48 %

## 2022-08-14 LAB — I-STAT CHEM 8, ED
BUN: 13 mg/dL (ref 8–23)
Calcium, Ion: 1.05 mmol/L — ABNORMAL LOW (ref 1.15–1.40)
Chloride: 106 mmol/L (ref 98–111)
Creatinine, Ser: 0.9 mg/dL (ref 0.61–1.24)
Glucose, Bld: 99 mg/dL (ref 70–99)
HCT: 40 % (ref 39.0–52.0)
Hemoglobin: 13.6 g/dL (ref 13.0–17.0)
Potassium: 3.8 mmol/L (ref 3.5–5.1)
Sodium: 139 mmol/L (ref 135–145)
TCO2: 25 mmol/L (ref 22–32)

## 2022-08-14 LAB — APTT: aPTT: 33 seconds (ref 24–36)

## 2022-08-14 LAB — CBC
HCT: 40 % (ref 39.0–52.0)
Hemoglobin: 13.3 g/dL (ref 13.0–17.0)
MCH: 33 pg (ref 26.0–34.0)
MCHC: 33.3 g/dL (ref 30.0–36.0)
MCV: 99.3 fL (ref 80.0–100.0)
Platelets: 207 10*3/uL (ref 150–400)
RBC: 4.03 MIL/uL — ABNORMAL LOW (ref 4.22–5.81)
RDW: 13.6 % (ref 11.5–15.5)
WBC: 3.7 10*3/uL — ABNORMAL LOW (ref 4.0–10.5)
nRBC: 0 % (ref 0.0–0.2)

## 2022-08-14 LAB — CBG MONITORING, ED: Glucose-Capillary: 98 mg/dL (ref 70–99)

## 2022-08-14 LAB — HEMOGLOBIN A1C
Hgb A1c MFr Bld: 5.6 % (ref 4.8–5.6)
Mean Plasma Glucose: 114.02 mg/dL

## 2022-08-14 LAB — RAPID URINE DRUG SCREEN, HOSP PERFORMED
Amphetamines: NOT DETECTED
Barbiturates: NOT DETECTED
Benzodiazepines: NOT DETECTED
Cocaine: POSITIVE — AB
Opiates: NOT DETECTED
Tetrahydrocannabinol: NOT DETECTED

## 2022-08-14 LAB — PROTIME-INR
INR: 1.1 (ref 0.8–1.2)
Prothrombin Time: 13.9 seconds (ref 11.4–15.2)

## 2022-08-14 LAB — HIV ANTIBODY (ROUTINE TESTING W REFLEX): HIV Screen 4th Generation wRfx: NONREACTIVE

## 2022-08-14 LAB — MRSA NEXT GEN BY PCR, NASAL: MRSA by PCR Next Gen: NOT DETECTED

## 2022-08-14 LAB — ETHANOL: Alcohol, Ethyl (B): <10 mg/dL (ref <10)

## 2022-08-14 SURGERY — IR WITH ANESTHESIA
Anesthesia: General

## 2022-08-14 MED ORDER — FAMOTIDINE 20 MG PO TABS
20.0000 mg | ORAL_TABLET | Freq: Two times a day (BID) | ORAL | Status: DC
  Administered 2022-08-14: 20 mg
  Filled 2022-08-14: qty 1

## 2022-08-14 MED ORDER — SENNOSIDES-DOCUSATE SODIUM 8.6-50 MG PO TABS: 1.0000 | ORAL_TABLET | Freq: Every evening | ORAL | Status: DC | PRN

## 2022-08-14 MED ORDER — SODIUM CHLORIDE 0.9 % IV SOLN: INTRAVENOUS | Status: DC

## 2022-08-14 MED ORDER — ACETAMINOPHEN 160 MG/5ML PO SOLN: 650.0000 mg | ORAL | Status: DC | PRN

## 2022-08-14 MED ORDER — FENTANYL 2500MCG IN NS 250ML (10MCG/ML) PREMIX INFUSION
25.0000 ug/h | INTRAVENOUS | Status: DC
  Filled 2022-08-14: qty 250

## 2022-08-14 MED ORDER — ACETAMINOPHEN 325 MG PO TABS: 650.0000 mg | ORAL_TABLET | ORAL | Status: DC | PRN

## 2022-08-14 MED ORDER — POLYETHYLENE GLYCOL 3350 17 G PO PACK: 17.0000 g | PACK | Freq: Every day | ORAL | Status: DC

## 2022-08-14 MED ORDER — DOCUSATE SODIUM 50 MG/5ML PO LIQD: 100.0000 mg | Freq: Two times a day (BID) | ORAL | Status: DC

## 2022-08-14 MED ORDER — SUCCINYLCHOLINE CHLORIDE 200 MG/10ML IV SOSY
PREFILLED_SYRINGE | INTRAVENOUS | Status: DC | PRN
  Administered 2022-08-14: 80 mg via INTRAVENOUS

## 2022-08-14 MED ORDER — CHLORHEXIDINE GLUCONATE CLOTH 2 % EX PADS
6.0000 | MEDICATED_PAD | Freq: Every day | CUTANEOUS | Status: DC
  Administered 2022-08-14 – 2022-08-17 (×2): 6 via TOPICAL

## 2022-08-14 MED ORDER — ASPIRIN 81 MG PO TBEC
81.0000 mg | DELAYED_RELEASE_TABLET | Freq: Every day | ORAL | Status: DC
  Administered 2022-08-16 – 2022-08-17 (×2): 81 mg via ORAL
  Filled 2022-08-14 (×2): qty 1

## 2022-08-14 MED ORDER — IOHEXOL 350 MG/ML SOLN
100.0000 mL | Freq: Once | INTRAVENOUS | Status: AC | PRN
  Administered 2022-08-14: 100 mL via INTRAVENOUS

## 2022-08-14 MED ORDER — PROPOFOL 10 MG/ML IV BOLUS
INTRAVENOUS | Status: DC | PRN
  Administered 2022-08-14: 100 mg via INTRAVENOUS

## 2022-08-14 MED ORDER — SODIUM CHLORIDE 0.9 % IV SOLN: INTRAVENOUS | Status: DC | PRN

## 2022-08-14 MED ORDER — LACTATED RINGERS IV SOLN: INTRAVENOUS | Status: DC | PRN

## 2022-08-14 MED ORDER — LIDOCAINE 2% (20 MG/ML) 5 ML SYRINGE
INTRAMUSCULAR | Status: DC | PRN
  Administered 2022-08-14: 60 mg via INTRAVENOUS

## 2022-08-14 MED ORDER — FENTANYL CITRATE (PF) 100 MCG/2ML IJ SOLN
INTRAMUSCULAR | Status: DC | PRN
  Administered 2022-08-14 (×2): 50 ug via INTRAVENOUS

## 2022-08-14 MED ORDER — CALCIUM GLUCONATE-NACL 1-0.675 GM/50ML-% IV SOLN
1.0000 g | Freq: Once | INTRAVENOUS | Status: AC
  Administered 2022-08-14: 1000 mg via INTRAVENOUS
  Filled 2022-08-14: qty 50

## 2022-08-14 MED ORDER — TENECTEPLASE FOR STROKE: 0.2500 mg/kg | PACK | Freq: Once | INTRAVENOUS | Status: DC

## 2022-08-14 MED ORDER — ROCURONIUM BROMIDE 100 MG/10ML IV SOLN
INTRAVENOUS | Status: DC | PRN
  Administered 2022-08-14: 20 mg via INTRAVENOUS
  Administered 2022-08-14: 50 mg via INTRAVENOUS

## 2022-08-14 MED ORDER — FENTANYL CITRATE PF 50 MCG/ML IJ SOSY
25.0000 ug | PREFILLED_SYRINGE | INTRAMUSCULAR | Status: DC | PRN
  Administered 2022-08-14 (×2): 100 ug via INTRAVENOUS
  Filled 2022-08-14 (×2): qty 2

## 2022-08-14 MED ORDER — GLYCOPYRROLATE 0.2 MG/ML IJ SOLN
INTRAMUSCULAR | Status: DC | PRN
  Administered 2022-08-14: .2 mg via INTRAVENOUS

## 2022-08-14 MED ORDER — ASPIRIN 81 MG PO TBEC: 81.0000 mg | DELAYED_RELEASE_TABLET | Freq: Every day | ORAL | Status: DC

## 2022-08-14 MED ORDER — SODIUM CHLORIDE (PF) 0.9 % IJ SOLN
INTRAVENOUS | Status: AC | PRN
  Administered 2022-08-14 (×2): 25 ug via INTRA_ARTERIAL

## 2022-08-14 MED ORDER — ACETAMINOPHEN 650 MG RE SUPP: 650.0000 mg | RECTAL | Status: DC | PRN

## 2022-08-14 MED ORDER — PROPOFOL 1000 MG/100ML IV EMUL
0.0000 ug/kg/min | INTRAVENOUS | Status: DC
  Administered 2022-08-14 (×2): 50 ug/kg/min via INTRAVENOUS
  Filled 2022-08-14 (×2): qty 100

## 2022-08-14 MED ORDER — STROKE: EARLY STAGES OF RECOVERY BOOK: Freq: Once | Status: AC

## 2022-08-14 MED ORDER — PROPOFOL 500 MG/50ML IV EMUL
INTRAVENOUS | Status: DC | PRN
  Administered 2022-08-14: 75 ug/kg/min via INTRAVENOUS

## 2022-08-14 MED ORDER — CLEVIDIPINE BUTYRATE 0.5 MG/ML IV EMUL
0.0000 mg/h | INTRAVENOUS | Status: AC
  Administered 2022-08-14 (×2): 2 mg/h via INTRAVENOUS
  Filled 2022-08-14: qty 50

## 2022-08-14 MED ORDER — CLEVIDIPINE BUTYRATE 0.5 MG/ML IV EMUL
INTRAVENOUS | Status: AC
  Filled 2022-08-14: qty 50

## 2022-08-14 MED ORDER — IOHEXOL 300 MG/ML  SOLN
150.0000 mL | Freq: Once | INTRAMUSCULAR | Status: AC | PRN
  Administered 2022-08-14: 65 mL via INTRA_ARTERIAL

## 2022-08-14 MED ORDER — FENTANYL CITRATE (PF) 100 MCG/2ML IJ SOLN
INTRAMUSCULAR | Status: AC
  Filled 2022-08-14: qty 2

## 2022-08-14 MED ORDER — CEFAZOLIN SODIUM-DEXTROSE 2-4 GM/100ML-% IV SOLN
INTRAVENOUS | Status: AC
  Filled 2022-08-14: qty 100

## 2022-08-14 MED ORDER — CEFAZOLIN SODIUM-DEXTROSE 2-3 GM-%(50ML) IV SOLR
INTRAVENOUS | Status: DC | PRN
  Administered 2022-08-14: 2 g via INTRAVENOUS

## 2022-08-14 MED ORDER — ASPIRIN 300 MG RE SUPP
300.0000 mg | Freq: Every day | RECTAL | Status: DC
  Administered 2022-08-14: 300 mg via RECTAL
  Filled 2022-08-14: qty 1

## 2022-08-14 MED ORDER — NITROGLYCERIN 1 MG/10 ML FOR IR/CATH LAB
INTRA_ARTERIAL | Status: AC
  Filled 2022-08-14: qty 10

## 2022-08-14 MED ORDER — FENTANYL BOLUS VIA INFUSION: 25.0000 ug | INTRAVENOUS | Status: DC | PRN

## 2022-08-14 MED ORDER — FENTANYL CITRATE PF 50 MCG/ML IJ SOSY: 25.0000 ug | PREFILLED_SYRINGE | Freq: Once | INTRAMUSCULAR | Status: DC

## 2022-08-14 MED ORDER — SODIUM CHLORIDE 0.9% FLUSH
3.0000 mL | Freq: Once | INTRAVENOUS | Status: AC
  Administered 2022-08-14: 3 mL via INTRAVENOUS

## 2022-08-14 MED ORDER — FENTANYL CITRATE PF 50 MCG/ML IJ SOSY: 25.0000 ug | PREFILLED_SYRINGE | INTRAMUSCULAR | Status: DC | PRN

## 2022-08-14 MED ORDER — PHENYLEPHRINE HCL-NACL 20-0.9 MG/250ML-% IV SOLN
INTRAVENOUS | Status: DC | PRN
  Administered 2022-08-14: 40 ug/min via INTRAVENOUS

## 2022-08-14 NOTE — Anesthesia Postprocedure Evaluation (Signed)
Anesthesia Post Note  Patient: [name]  Procedure(s) Performed: IR WITH ANESTHESIA     Patient location during evaluation: SICU Anesthesia Type: General Level of consciousness: sedated Pain management: pain level controlled Vital Signs Assessment: post-procedure vital signs reviewed and stable Respiratory status: patient remains intubated per anesthesia plan Cardiovascular status: stable Postop Assessment: no apparent nausea or vomiting Anesthetic complications: no   No notable events documented.  Last Vitals:  Vitals:   08/14/22 0815 08/14/22 0830  BP: (!) 151/93 (!) 140/93  Pulse:  (!) 56  Resp: 16 16  Temp:    SpO2:  100%    Last Pain:  Vitals:   08/14/22 0510  TempSrc: Axillary                 Nadia Viar S

## 2022-08-14 NOTE — H&P (Addendum)
Admission H&P    Chief Complaint: Acute onset of right hemiplegia and aphasia  HPI: [name] is an 67 y.o. male with a PMHx of stroke with LUE weakness, non-Hodgkin's lymphoma, s/p portacath placement followed by removal on 07/26/22, CAD s/p CABG x 4 in 2010, CHF and substance abuse who presents to the ED via EMS as a Code Stroke. LKN was 0430 when he was interacting with associates at a local house which per EMS appeared to be a residential building used for the purpose of substance abuse and exchange. He abruptly became aphasic with right sided weakness per bystanders who called EMS. On EMS arrival they noted same. CBG 139, BP 132/77.  Modified Rankin Scale 1 based on bystander report of totally independent function prior to onset of symptoms  PMHx: As per HPI  No family history on file.  Social History: Substance abuse  Allergies: NKDA  Meds: ASA Tessalon Rosuvastatin  ROS: Unable to obtain due to aphasia   Physical Examination: Weight 49.4 kg.  Physical Exam  HEENT-  /AT    Lungs- Respirations unlabored Extremities- No edema  Neurological Examination Mental Status: Obtunded with dense receptive and expressive aphasia. Not following any commands except for an attempt to cooperate when examiner lifted LUE and asked him to keep it raised. Nonverbal with no attempts to communicate. Cranial Nerves: II: Blinks to threat on the left but not on the right. PERRL 3 mm >> 2 mm III,IV, VI: Eyelids are half-open. Forced gaze deviation to the left which cannot be overcome with oculocephalic maneuver.   V: Reacts to eyelid stimulation on the left but not on the right.   VII: Prominent right facial droop, lower quadrant VIII: Unable to assess IX,X: Gag reflex deferred XI: Head preferentially rotated to the left.  XII: Not following commands for assessment Motor/Sensory: RUE with increased flexor tone; drops immediately to bed when elevated and released by examiner; reflexive  internal rotation to pinch after a delay RLE drops to bed with slight effort against gravity after passive elevation and release. Withdraws weakly to noxious plantar stimulation LUE 5/5 LLE with brisk withdrawal to noxious plantar stimulation  Deep Tendon Reflexes: Brisk low amplitude reflexes x 4 without asymmetry Cerebellar: Unable to assess  Gait: Unable to assess  NIHSS: 27  Results for orders placed or performed during the hospital encounter of 08/14/22 (from the past 48 hour(s))  CBG monitoring, ED     Status: None   Collection Time: 08/14/22  5:10 AM  Result Value Ref Range   Glucose-Capillary 98 70 - 99 mg/dL    Comment: Glucose reference range applies only to samples taken after fasting for at least 8 hours.  I-stat chem 8, ED     Status: Abnormal   Collection Time: 08/14/22  5:17 AM  Result Value Ref Range   Sodium 139 135 - 145 mmol/L   Potassium 3.8 3.5 - 5.1 mmol/L   Chloride 106 98 - 111 mmol/L   BUN 13 8 - 23 mg/dL   Creatinine, Ser 4.09 0.61 - 1.24 mg/dL   Glucose, Bld 99 70 - 99 mg/dL    Comment: Glucose reference range applies only to samples taken after fasting for at least 8 hours.   Calcium, Ion 1.05 (L) 1.15 - 1.40 mmol/L   TCO2 25 22 - 32 mmol/L   Hemoglobin 13.6 13.0 - 17.0 g/dL   HCT 81.1 91.4 - 78.2 %   CT HEAD CODE STROKE WO CONTRAST  Result Date: 08/14/2022  CLINICAL DATA:  Code stroke. Male of unknown age with right side weakness. EXAM: CT HEAD WITHOUT CONTRAST TECHNIQUE: Contiguous axial images were obtained from the base of the skull through the vertex without intravenous contrast. RADIATION DOSE REDUCTION: This exam was performed according to the departmental dose-optimization program which includes automated exposure control, adjustment of the mA and/or kV according to patient size and/or use of iterative reconstruction technique. COMPARISON:  None Available. FINDINGS: Brain: Bilateral subdural hematoma. Mostly hypodense left side subdural hematoma  measures 4-5 mm in thickness at most levels. Smaller but more I so to hyperdense right side subdural hematoma is at most 3-4 mm, and less than that along the anterior frontal convexity. No significant midline shift. Mild mass effect on the ventricles more apparent on the left. Dural calcification along the falx. Small if any para falcine or tentorial component of blood. Basilar cisterns remain patent including the suprasellar cistern. No IVH or ventriculomegaly. No intra-axial or subarachnoid hemorrhage component is identified. No cortically based acute infarct identified. Chronic appearing white matter encephalomalacia in the right frontal lobe. No cortical encephalomalacia identified. Vascular: No suspicious intracranial vascular hyperdensity. Calcified atherosclerosis at the skull base. Skull: No acute osseous abnormality identified. Sinuses/Orbits: Visualized paranasal sinuses and mastoids are well aerated. Other: Slight leftward gaze. Orbit and scalp soft tissues otherwise negative. ASPECTS [facility] Stroke Program Early CT Score) Total score (0-10 with 10 being normal): Probably not applicable due to bilateral subdural hematoma. IMPRESSION: 1. Bilateral Subdural Hematoma, slightly larger (5 mm) and hypodense on the Left, more isodense and at most 3-4 mm on the Right. 2. Mild associated intracranial mass effect but no midline shift. Basilar cisterns remain patent. 3. No skull fracture or other acute intracranial abnormality identified. Small areas of chronic appearing white matter encephalomalacia in the right frontal lobe. #1 was communicated to Dr. Otelia Limes at 5:23 am on 08/14/2022 by text page via the [facility] messaging system. Electronically Signed   By: Odessa Fleming M.D.   On: 08/14/2022 05:26     Assessment: 67 year old male presenting with acute onset of right hemiplegia and aphasia - Exam reveals findings consistent with a large left MCA territory stroke - CT head: 1. Bilateral Subdural Hematoma, slightly  larger (5 mm) and hypodense on the Left, more isodense and at most 3-4 mm on the Right. 2. Mild associated intracranial mass effect but no midline shift. Basilar cisterns remain patent. 3. No skull fracture or other acute intracranial abnormality identified. Small areas of chronic appearing white matter encephalomalacia in the right frontal lobe.  - The patient is not a TNK candidate due to subdural hematomas - CTA of head and neck: Positive for Left MCA distal M1 ELVO. Underlying advanced intracranial atherosclerosis including chronic severe bilateral ICA siphon stenosis due to bulky cavernous segment plaque. Right MCA distal M1/bifurcation mild-to-moderate stenosis appears progressed since the 2022 CTA. Similar Right PCA P2 segment moderate stenosis also appears progressed. Comparatively mild extracranial atherosclerosis. No significant stenosis in the neck. - CTP: 38 mL left MCA infarct core. 105 mL volume of left MCA oligemia/penumbra. Hypoperfusion index 0.6. - The patient is a VIR candidate on an emergent basis. Dr. Blinda Leatherwood, Dr. Eudelia Bunch and myself have discussed extensively the risks/benefits of the procedure. With VIR, he has an approximately 50% chance of significant clinical neurological improvement, but with an associated approximate 10% chance of subarachnoid hemorrhage with associated possibility of significant worsening including death. The patient was not able to provide informed consent due to aphasi. Family was  not available to obtain consent despite multiple attempts to reach by telephone. The patient fits criteria for emergent two-physician decision-making regarding emergency treatment for his left M1 occlusion, which is potentially life-threatening due to the high risk for massive stroke in addition to a high risk for severe long-term morbidity if he survives without treatment.   - DDx for etiology includes possible acute effects of illicit substance use, atherothrombotic and cardioembolic.       Plan: 1. Admitting to Neuro ICU under the Neurology service after VIR.  2. Post-VIR order set to include frequent neuro checks and BP management.  3. No antiplatelet medications or anticoagulants for at least 24 hours following VIR, unless stent placement is indicated.  4. DVT prophylaxis with SCDs.  5. Will need to continue his statin.  6. Will need escalation of antiplatelet therapy to DAPT if follow up CT at 24 hours is negative for hemorrhagic conversion, unless he was noncompliant with his home ASA. 7. Cardiac telemetry 8. TTE.  9. MRI brain.  10. PT/OT/Speech.  11. NPO until passes swallow evaluation.  12. Fasting lipid panel, HgbA1c 13. UDS and EtOH level.  14. Drug cessation counseling 15. Full Code. Has both full code and DNR designation on prior admissions. As he is aphasic currently the default designation is Full Code.   80 minutes spent in the emergent neurological evaluation and management of this critically ill patient.   Electronically signed: Dr. Caryl Pina 08/14/2022, 5:37 AM

## 2022-08-14 NOTE — Consult Note (Signed)
NAME:  [name], MRN:  [id number], DOB:  October 12, 1955, LOS: 0 ADMISSION DATE:  08/14/2022, CONSULTATION DATE:  08/14/2022 REFERRING MD:  Dr. Otelia Limes - Neuro, CHIEF COMPLAINT: Ventilator support in the setting of CVA  History of Present Illness:  [name] is a 67 year old male with medical history significant for prior stroke with LUE weakness, non-Hodgkin's lymphoma, CAD s/p CABG x 4 in 2010, CHF, and substance abuse who presented to ED via EMS with sudden onset right-sided weakness, right gaze, and aphasic, last known normal 0430.  Per EMS it appears patient was found at a residential building use for purposes of substance abuse and exchange.  Code stroke was initiated on arrival.  Head CT on arrival with bilateral subdural hematomas slightly larger, no fracture.  CTA head and neck positive for left MCA and distal M1 ELVO.  Neurology and neurointerventional radiologist discussed case and decision made to proceed with thrombectomy with two-physician consent as patient was unable to make medical decisions and no family was present.  Patient was intubated prior to IR procedure and remained ventilated postprocedure, PCCM consulted for ventilator management.  Pertinent  Medical History  Prior stroke with LUE weakness, non-Hodgkin's lymphoma, CAD s/p CABG x 4 in 2010, CHF, and substance abuse  Significant Hospital Events: Including procedures, antibiotic start and stop dates in addition to other pertinent events   5/20 presented with right hemiplegia, aphasia, and right gaze, CTA positive for left MCA and distal M1 LVO.  Taken for emergent thrombectomy, remained intubated postprocedure, PCCM consulted  Interim History / Subjective:  Sedated on ventilator  Objective   Blood pressure (!) 139/103, pulse 76, temperature (!) 97.5 F (36.4 C), temperature source Axillary, resp. rate 14, weight 49.9 kg, SpO2 100 %.        Intake/Output Summary (Last 24 hours) at 08/14/2022 0747 Last data filed  at 08/14/2022 4098 Gross per 24 hour  Intake 1050 ml  Output 500 ml  Net 550 ml   Filed Weights   08/14/22 0500 08/14/22 0603  Weight: 49.4 kg 49.9 kg    Examination: General: Acute on chronic ill-appearing middle-age male lying in bed on mechanical ventilation in no acute distress HEENT: ETT, MM pink/moist, PERRL,  Neuro: Sedated on ventilator, unresponsive CV: s1s2 regular rate and rhythm, no murmur, rubs, or gallops,  PULM: Clear to auscultation bilaterally, no increased work of breathing, no added breath sounds, tolerating ventilator GI: soft, bowel sounds active in all 4 quadrants, non-tender, non-distended Extremities: warm/dry, no edema  Skin: no rashes or lesions  Resolved Hospital Problem list     Assessment & Plan:  Acute stroke with left MCA distal M1 ELVO  -CTA positive for LVO on admission, additional advanced areas of intracranial atherosclerosis seen -Underwent mechanical thrombectomy with IR with 1 pass, no stent required. Bilateral subdural hematomas -Slightly larger on the left with mild intracranial mass effect but no midline shift no fracture  Prior stroke with LUE weakness P: Primary management per neurology, neurointerventional radiology Maintain neuro protective measures; goal for eurothermia, euglycemia, eunatermia, normoxia, and PCO2 goal of 35-40 Nutrition and bowel regiment  Seizure precautions  AEDs per neurology  Aspirations precautions  Continue statin Secondary stroke prevention Further imaging per neurology No anticoagulation x 24 hours per neurology  Acute postprocedural respiratory insufficiency requiring mechanical ventilation P: Continue ventilator support with lung protective strategies  Wean PEEP and FiO2 for sats greater than 90%. Head of bed elevated 30 degrees. Plateau pressures less than 30 cm H20.  Follow intermittent chest x-ray and ABG.   SAT/SBT as tolerated, mentation preclude extubation  Ensure adequate pulmonary  hygiene  Follow cultures  VAP bundle in place  PAD protocol  CAD s/p CABG x 4 in 2010 HFrEF -Echocardiogram December 2022 with akinesis of the entire apex with mild to moderate LV dysfunction, EF 40 to 45%, regional wall motion abnormality, and grade 1 diastolic dysfunction P: Continuous telemetry  Strict intake and output  Daily weight to assess volume status Daily assessment for need to diurese  Closely monitor renal function and electrolytes  Repeat echo pending  Polysubstance abuse including cocaine, marijuana, tobacco P: Cessation education when appropriate  IV large B cell non-Hodgkin lymphoma  -Diagnosed February 2021, now 39-month observation per oncology.  Port-A-Cath removed 07/26/2022 P: Outpatient follow-up  Best Practice (right click and "Reselect all SmartList Selections" daily)   Diet/type: tubefeeds DVT prophylaxis: SCD GI prophylaxis: PPI Lines: N/A Foley:  N/A Code Status:  full code Last date of multidisciplinary goals of care discussion: Pending   Labs   CBC: Recent Labs  Lab 08/14/22 0515 08/14/22 0517  WBC 3.7*  --   NEUTROABS 1.8  --   HGB 13.3 13.6  HCT 40.0 40.0  MCV 99.3  --   PLT 207  --     Basic Metabolic Panel: Recent Labs  Lab 08/14/22 0515 08/14/22 0517  NA 134* 139  K 3.7 3.8  CL 105 106  CO2 22  --   GLUCOSE 100* 99  BUN 12 13  CREATININE 0.99 0.90  CALCIUM 8.3*  --    GFR: CrCl cannot be calculated (Unknown ideal weight.). Recent Labs  Lab 08/14/22 0515  WBC 3.7*    Liver Function Tests: Recent Labs  Lab 08/14/22 0515  AST 16  ALT 14  ALKPHOS 54  BILITOT 0.7  PROT 6.1*  ALBUMIN 3.2*   No results for input(s): "LIPASE", "AMYLASE" in the last 168 hours. No results for input(s): "AMMONIA" in the last 168 hours.  ABG    Component Value Date/Time   TCO2 25 08/14/2022 0517     Coagulation Profile: Recent Labs  Lab 08/14/22 0515  INR 1.1    Cardiac Enzymes: No results for input(s): "CKTOTAL",  "CKMB", "CKMBINDEX", "TROPONINI" in the last 168 hours.  HbA1C: No results found for: "HGBA1C"  CBG: Recent Labs  Lab 08/14/22 0510  GLUCAP 98    Review of Systems:   Unable to assess  Past Medical History:  He,  has no past medical history on file.   Surgical History:  History reviewed. No pertinent surgical history.   Social History:      Family History:  His family history is not on file.   Allergies Not on File   Home Medications  Prior to Admission medications   Not on File     Critical care time:   CRITICAL CARE Performed by: Madeleyn Schwimmer D. Harris   Total critical care time: 42 minutes  Critical care time was exclusive of separately billable procedures and treating other patients.  Critical care was necessary to treat or prevent imminent or life-threatening deterioration.  Critical care was time spent personally by me on the following activities: development of treatment plan with patient and/or surrogate as well as nursing, discussions with consultants, evaluation of patient's response to treatment, examination of patient, obtaining history from patient or surrogate, ordering and performing treatments and interventions, ordering and review of laboratory studies, ordering and review of radiographic studies, pulse oximetry and re-evaluation  of patient's condition.  Mahad Newstrom D. Harris, NP-C [facility] Personal contact information can be found on Amion  If no contact or response made please call [phone] 08/14/2022, 10:12 AM

## 2022-08-14 NOTE — Progress Notes (Addendum)
OT Cancellation Note  Patient Details Name: [name] MRN: [id number] DOB: 11-Oct-1955   Cancelled Treatment:    Reason Eval/Treat Not Completed: Medical issues which prohibited therapy Patient is currently intubated and sedated. OT speaking with RN, hopeful plan for extubation this afternoon. OT will follow back when medically appropriate.   Pollyann Glen E. Carra Brindley, OTR/L Acute Rehabilitation Services [phone]   Cherlyn Cushing 08/14/2022, 8:48 AM

## 2022-08-14 NOTE — Progress Notes (Signed)
SLP Cancellation Note  Patient Details Name: [name] MRN: [id number] DOB: 1955/08/27   Cancelled treatment:    Pt still intubated and sedated after procedure with possible extubation later today.   SLP will follow for readiness.  Bernestine Holsapple L. Samson Frederic, MA CCC/SLP Clinical Specialist - Acute Care SLP Acute Rehabilitation Services Office number [phone]      Blenda Mounts Laurice 08/14/2022, 10:44 AM

## 2022-08-14 NOTE — ED Triage Notes (Signed)
Pt BIB [facility] from "a unknown house". Per friends in the home, LSK was 0430. Pt had sudden onset of Rside weakness/ R guaze and aphasic. CBG 139

## 2022-08-14 NOTE — Progress Notes (Addendum)
STROKE TEAM PROGRESS NOTE   INTERVAL HISTORY No family is at the bedside.  Admitted 5-19 after presenting with acute onset of right hemiplegia and aphasia.  CTA showed left M1 occlusion patient had subdurals therefore was not a TNK candidate.  Patient taken to IR with TICI 3 revascularization of the left MCA M1.  Left intubated postprocedure.  MRI today shows punctate acute infarct left corona radiata, acute cortical infarction in left occipital lobe, along with unchanged subdural hematomas.   On exam patient follows commands and moves all extremities.  Inconsistent tracking.  Vitals:   08/14/22 1630 08/14/22 1657 08/14/22 1700 08/14/22 1736  BP: 139/84  136/88   Pulse: 65  66   Resp: 14  (!) 9   Temp:      TempSrc:      SpO2: 100% 100% 100% 97%  Weight:      Height:       CBC:  Recent Labs  Lab 08/14/22 0515 08/14/22 0517  WBC 3.7*  --   NEUTROABS 1.8  --   HGB 13.3 13.6  HCT 40.0 40.0  MCV 99.3  --   PLT 207  --    Basic Metabolic Panel:  Recent Labs  Lab 08/14/22 0515 08/14/22 0517  NA 134* 139  K 3.7 3.8  CL 105 106  CO2 22  --   GLUCOSE 100* 99  BUN 12 13  CREATININE 0.99 0.90  CALCIUM 8.3*  --    Lipid Panel: No results for input(s): "CHOL", "TRIG", "HDL", "CHOLHDL", "VLDL", "LDLCALC" in the last 168 hours. HgbA1c:  Recent Labs  Lab 08/14/22 1205  HGBA1C 5.6   Urine Drug Screen:  Recent Labs  Lab 08/14/22 1130  LABOPIA NONE DETECTED  COCAINSCRNUR POSITIVE*  LABBENZ NONE DETECTED  AMPHETMU NONE DETECTED  THCU NONE DETECTED  LABBARB NONE DETECTED    Alcohol Level  Recent Labs  Lab 08/14/22 0515  ETH <10    IMAGING past 24 hours MR BRAIN WO CONTRAST  Result Date: 08/14/2022 CLINICAL DATA:  Stroke, follow-up. EXAM: MRI HEAD WITHOUT CONTRAST TECHNIQUE: Multiplanar, multiecho pulse sequences of the brain and surrounding structures were obtained without intravenous contrast. COMPARISON:  Head CT and CTA head/neck 08/14/2022. FINDINGS: Brain:  Small focus of acute cortical infarction in the left occipital lobe (image 27 series 2). Punctate acute infarct in the left corona radiata (image 30 series 2). Unchanged bilateral mixed signal, holohemispheric subdural hematomas, measuring up to 6 mm on the right and 7 mm on the left (image 23 series 4). Unchanged mild chronic small-vessel disease with old infarcts in the right corona radiata and centrum semiovale. No hydrocephalus or midline shift. Vascular: Normal flow voids. Skull and upper cervical spine: Normal marrow signal. Sinuses/Orbits: Unremarkable. Other: None. IMPRESSION: 1. Small focus of acute cortical infarction in the left occipital lobe. Punctate acute infarct in the left corona radiata. 2. Unchanged bilateral mixed signal, holohemispheric subdural hematomas, measuring up to 6 mm on the right and 7 mm on the left. Electronically Signed   By: Orvan Falconer M.D.   On: 08/14/2022 15:33   DG Abd Portable 1V  Result Date: 08/14/2022 CLINICAL DATA:  Tube placement EXAM: PORTABLE ABDOMEN - 1 VIEW COMPARISON:  X-ray 03/14/2019.  CT 06/06/2022 FINDINGS: Limited x-ray of the upper abdomen lower chest portable demonstrates enteric tube with tip overlying the midbody of the stomach. The side hole is just past the area of the GE junction. Surgical clips seen otherwise along the lower mediastinum. Minimal bowel  gas elsewhere in the abdomen. Question gastric fold thickening versus underdistention. IMPRESSION: Enteric tube with tip overlying the midbody of the stomach Electronically Signed   By: Karen Kays M.D.   On: 08/14/2022 10:01   CT HEAD CODE STROKE WO CONTRAST  Addendum Date: 08/14/2022   ADDENDUM REPORT: 08/14/2022 05:51 ADDENDUM: ASPECTS 10. This was discussed by telephone with Lindzen on 08/14/2022 at 0536 hours. Electronically Signed   By: Odessa Fleming M.D.   On: 08/14/2022 05:51   Result Date: 08/14/2022 CLINICAL DATA:  Code stroke. Male of unknown age with right side weakness. EXAM: CT HEAD  WITHOUT CONTRAST TECHNIQUE: Contiguous axial images were obtained from the base of the skull through the vertex without intravenous contrast. RADIATION DOSE REDUCTION: This exam was performed according to the departmental dose-optimization program which includes automated exposure control, adjustment of the mA and/or kV according to patient size and/or use of iterative reconstruction technique. COMPARISON:  None Available. FINDINGS: Brain: Bilateral subdural hematoma. Mostly hypodense left side subdural hematoma measures 4-5 mm in thickness at most levels. Smaller but more I so to hyperdense right side subdural hematoma is at most 3-4 mm, and less than that along the anterior frontal convexity. No significant midline shift. Mild mass effect on the ventricles more apparent on the left. Dural calcification along the falx. Small if any para falcine or tentorial component of blood. Basilar cisterns remain patent including the suprasellar cistern. No IVH or ventriculomegaly. No intra-axial or subarachnoid hemorrhage component is identified. No cortically based acute infarct identified. Chronic appearing white matter encephalomalacia in the right frontal lobe. No cortical encephalomalacia identified. Vascular: No suspicious intracranial vascular hyperdensity. Calcified atherosclerosis at the skull base. Skull: No acute osseous abnormality identified. Sinuses/Orbits: Visualized paranasal sinuses and mastoids are well aerated. Other: Slight leftward gaze. Orbit and scalp soft tissues otherwise negative. ASPECTS [facility] Stroke Program Early CT Score) Total score (0-10 with 10 being normal): Probably not applicable due to bilateral subdural hematoma. IMPRESSION: 1. Bilateral Subdural Hematoma, slightly larger (5 mm) and hypodense on the Left, more isodense and at most 3-4 mm on the Right. 2. Mild associated intracranial mass effect but no midline shift. Basilar cisterns remain patent. 3. No skull fracture or other acute  intracranial abnormality identified. Small areas of chronic appearing white matter encephalomalacia in the right frontal lobe. #1 was communicated to Dr. Otelia Limes at 5:23 am on 08/14/2022 by text page via the [facility] messaging system. Electronically Signed: By: Odessa Fleming M.D. On: 08/14/2022 05:26   CT ANGIO HEAD NECK W WO CM W PERF (CODE STROKE)  Result Date: 08/14/2022 CLINICAL DATA:  67 year old male code stroke presentation, right side weakness. Bilateral subdural hematoma on plain head CT. EXAM: CT ANGIOGRAPHY HEAD AND NECK CT PERFUSION BRAIN. TECHNIQUE: Multidetector CT imaging of the head and neck was performed using the standard protocol during bolus administration of intravenous contrast. Multiplanar CT image reconstructions and MIPs were obtained to evaluate the vascular anatomy. Carotid stenosis measurements (when applicable) are obtained utilizing NASCET criteria, using the distal internal carotid diameter as the denominator. CT Perfusion performed during bolus contrast administration, analyzed with RAPID software. RADIATION DOSE REDUCTION: This exam was performed according to the departmental dose-optimization program which includes automated exposure control, adjustment of the mA and/or kV according to patient size and/or use of iterative reconstruction technique. CONTRAST:  OMNIPAQUE IOHEXOL 350 MG/ML SOLN COMPARISON:  Plain head CT 0519 hours today. Previous CTA head and neck 03/01/2021 FINDINGS: CTA NECK Skeleton: Absent maxillary dentition. Carious  mandible dentition. Previous sternotomy. Chronic cervical spine degeneration. No acute osseous abnormality identified. Upper chest: Some centrilobular emphysema.  Evidence of prior CABG. Other neck: No acute finding. Aortic arch: 3 vessel arch.  Relatively mild arch atherosclerosis. Right carotid system: No brachiocephalic artery or right CCA stenosis. Calcified plaque at the posterior right ICA origin and bulb with no significant stenosis. Left  carotid system: Mild left CCA plaque, no significant stenosis. Left ICA origin and bulb soft and calcified plaque with no significant stenosis. Additional mild calcified plaque just below the skull base with no significant stenosis. Vertebral arteries: No significant proximal subclavian artery stenosis. Normal vertebral artery origins. Left vertebral artery appears mildly dominant. Both are patent to the skull base with no significant stenosis. CTA HEAD Posterior circulation: Patent distal vertebral arteries, PICA origins and vertebrobasilar junction with no significant stenosis. Patent basilar artery. SCA and PCA origins remain patent. Mild left and up to moderate right PCA P1 and P2 segment irregularity is noted and appears increased since 2022. But bilateral distal PCA branches remain patent. There is moderate right P2 segment stenosis on series 12, image 18. Anterior circulation: Both ICA siphons are patent but heavily calcified. Severe right cavernous segment calcified plaque and stenosis (series 8, image 85) appears stable since 2022. The right ICA terminus remains patent. Left ICA siphon bulky calcified plaque also with severe cavernous segment stenosis on series 8, image 90, which has not significantly changed. Left ICA terminus remains patent. MCA and ACA origins remain patent. Bilateral ACA branches, anterior communicating artery, and right MCA M1 appear stable since 2022. ACA branches are within normal limits. There is mild to moderate right MCA bifurcation irregularity and stenosis. Right MCA branches are stable. There is new left MCA distal M1 segment occlusion (series 10, image 23 and series 11, image 19. Compared to the 2022 CTA, there is relatively poor early MCA branch collateral enhancement. Only the dominant posterior branch is somewhat spared. Venous sinuses: Early contrast timing, grossly patent. Anatomic variants: None significant. Other findings: No contrast extravasation identified with  either subdural hematoma. CT PERFUSION: ASPECTS 10 at 0519 hours today. Estimated infarct core 38 mL based on CBF less than 30%. Estimated penumbra 105 mL based on T-max greater than 6 S. Mismatch volume 67 mL, mismatch ratio Ratio 2.8. Hypoperfusion index 0.6. Left MCA territory affected. Review of the MIP images confirms the above findings Preliminary results of the above were communicated to Dr. Otelia Limes at 5:36 am on 08/14/2022 by text page via the [facility] messaging system. And also discussed by telephone at that same time. IMPRESSION: 1. Positive for Left MCA distal M1 ELVO. 2. CT Perfusion estimates 38 mL left MCA infarct core. ASPECTS 10 at 0519 hours today. 105 mL volume of left MCA oligemia/penumbra. Hypoperfusion index 0.6. 3. The above Study discussed by telephone with Dr. Otelia Limes on 08/14/2022 at 0536 hours. 4. Underlying advanced intracranial atherosclerosis including chronic severe bilateral ICA siphon stenosis due to bulky cavernous segment plaque. Right MCA distal M1/bifurcation mild-to-moderate stenosis appears progressed since the 2022 CTA. Similar Right PCA P2 segment moderate stenosis also appears progressed. 5. Comparatively mild extracranial atherosclerosis. No significant stenosis in the neck. 6. Plain Head CT, bilateral subdural hematoma detailed separately today. 7. Emphysema (ICD10-J43.9). Aortic Atherosclerosis (ICD10-I70.0). Prior CABG. Electronically Signed   By: Odessa Fleming M.D.   On: 08/14/2022 05:50    PHYSICAL EXAM Physical Exam  HEENT-  [place]/AT    Lungs- intubated and mechanically ventilated Extremities- No edema   Neurological  Examination Mental Status: Intubated and sedated. Follows intermittent commands, generalized weakness.  Cranial Nerves: Blinks to threat on the left but not on the right. PERRL 3 mm >> 2 mm. Does not track.  Reacts to eyelid stimulation on the left but not on the right.   Prominent right facial droop, lower quadrant  Motor/Sensory: RUE with increased  tone; drops immediately to bed when elevated and released. Wd to pain. RLE drops to bed with slight effort against gravity after passive elevation and release.  LUE 5/5, purposeful LLE with brisk withdrawal to pain.  Cerebellar: Unable to assess  Gait: Unable to assess    ASSESSMENT/PLAN [name] is an 67 y.o. male with a PMHx of stroke with LUE weakness, non-Hodgkin's lymphoma, s/p portacath placement followed by removal on 07/26/22, CAD s/p CABG x 4 in 2010, CHF and substance abuse who presents to the ED via EMS as a Code Stroke. LKN was 0430 when he was interacting with associates and abruptly became aphasic with right sided weakness per bystanders who called EMS. On EMS arrival they noted same. CBG 139, BP 132/77.   Acute cortical infarction left occipital lobe Punctate acute infarct left corona radiata Bilateral subdural hematomas Etiology: Left MCA distal M1 LVO s/p TICI 3 revascularization Underlying advanced intracranial atherosclerosis including chronic severe bilateral ICA stenosis.  Hx of CVA with residual LUE weakness  Code Stroke CT head  Bilateral subdural hematomas Small areas of chronic appearing white matter encephalomalacia in right frontal lobe CTA head & neck  Left MCA distal M1 ELVO CT perfusion  38 mm left MCA infarct core.  105 mL volume of left MCA penumbra Hypoperfusion index 0.6 Cerebral angio  Occluded left middle cerebral artery distal M1 segment.  TICI 3 revascularization. Post IR CT: No ICH.  MRI   Small focus of acute cortical infarcts in the left occipital lobe Punctate acute infarct in the left corona radiata Unchanged subdural hematomas, measuring up to 6 mm on the right 7 mm on the left.  2D Echo: pending   LDL No results found for requested labs within last 1095 days. HgbA1c 5.6 VTE prophylaxis - SCDs    Diet   Diet NPO time specified   No antithrombotic prior to admission, now on Aspirin alone. Pending swallow screen post-self  extubation. If patient does not pass, can give asa rectal x1.  Therapy recommendations:  pending Disposition:  pending  Acute procedure respiratory insufficiency requiring mechanical ventilation Appreciate CCM assistance with vent management Self-extubated around 1700, stable.   Hypertension Home meds:  none Cleviprex gtt, wean as able.  SBP goal 120-160 post-IR intervention Long-term BP goal normotensive  Hyperlipidemia Home meds:  none LDL No results found for requested labs within last 1095 days., goal < 70 Pending LDL for risk factor modification   Diabetes type II, no history Home meds:  none HgbA1c 5.6, goal < 7.0 CBGs Recent Labs    08/14/22 0510  GLUCAP 98    SSI  Other Stroke Risk Factors Advanced Age >/= 3  Substance abuse - UDS:  Cocaine POSITIVE.  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 Note/plan to be edited by MD as needed.    Lynnae January, DNP, AGACNP-BC [facility] Please use AMION for contact information & EPIC for messaging.  I have personally obtained history,examined this patient, reviewed notes, independently viewed imaging studies, participated in medical decision making and plan of care.ROS completed by me personally and pertinent positives fully documented  I have made any additions or clarifications directly to the above note. Agree with note above.  Patient presented with aphasia and right hemiplegia due to left M1 occlusion with a relatively small core and favorable penumbra and underwent thrombectomy with excellent revascularization.  TNKase not administered due to presence of bilateral chronic subdural hematomas.  Patient has self extubated himself.  Recommend swallow  eval by speech therapy.  Start aspirin alone for now and may consider dual antiplatelet therapy given significant intracranial atherosclerosis which may contributed to stroke.  Strict control of hypertension with blood pressure goal 120-160 systolic for the first 24 hours as per post thrombectomy protocol.  Aggressive risk factor modification.  Patient will need counseling to quit cocaine.  No family available at the bedside for discussion.  Discussed with Dr. Denese Killings critical care medicine. This patient is critically ill and at significant risk of neurological worsening, death and care requires constant monitoring of vital signs, hemodynamics,respiratory and cardiac monitoring, extensive review of multiple databases, frequent neurological assessment, discussion with family, other specialists and medical decision making of high complexity.I have made any additions or clarifications directly to the above note.This critical care time does not reflect procedure time, or teaching time or supervisory time of PA/NP/Med Resident etc but could involve care discussion time.  I spent 35 minutes of neurocritical care time  in the care of  this patient.     Delia Heady, MD Medical Director [facility] Stroke Center Pager: [phone] 08/14/2022 6:32 PM   To contact Stroke Continuity provider, please refer to [email or web address]. After hours, contact General Neurology

## 2022-08-14 NOTE — ED Provider Notes (Signed)
[place] EMERGENCY DEPARTMENT AT [facility] Provider Note  CSN: [id number] Arrival date & time: 08/14/22 4098  Chief Complaint(s) Code Stroke ED Triage Notes Gunnar Fusi, RN (Registered Nurse)  Nursing  Date of Service: 08/14/2022  5:08 AM  Signed Pt BIB [facility] from "a unknown house". Per friends in the home, LSK was 0430. Pt had sudden onset of Rside weakness/ R guaze and aphasic. CBG 139     HPI [name] is a 67 y.o. male brought in by EMS as a code stroke for aphasia and right-sided deficits  Remainder of history, ROS, and physical exam limited due to patient's condition.  Additional information was obtained from EMS.   Level V Caveat.   The history is provided by the EMS personnel.    Past Medical History History reviewed. No pertinent past medical history. 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  Review of Systems Review of Systems As noted in HPI  Physical Exam Vital Signs  I have reviewed the triage vital signs BP (!) 139/103   Pulse 76   Temp (!) 97.5 F (36.4 C) (Axillary)   Resp 14   Wt 49.9 kg   SpO2 100%   Physical Exam Vitals reviewed.  Constitutional:      General: He is not in acute distress.    Appearance: He is well-developed. He is not diaphoretic.  HENT:     Head: Normocephalic and atraumatic.     Nose: Nose normal.  Eyes:     General: No scleral icterus.       Right eye: No discharge.        Left eye: No discharge.     Conjunctiva/sclera: Conjunctivae normal.     Pupils: Pupils are equal, round, and reactive to light.  Cardiovascular:     Rate and Rhythm: Normal rate and regular rhythm.     Heart sounds: No  murmur heard.    No friction rub. No gallop.  Pulmonary:     Effort: Pulmonary effort is normal. No respiratory distress.     Breath sounds: Normal breath sounds. No stridor. No rales.  Abdominal:     General: There is no distension.     Palpations: Abdomen is soft.     Tenderness: There is no abdominal tenderness.  Musculoskeletal:        General: No tenderness.     Cervical back: Normal range of motion and neck supple.  Skin:    General: Skin is warm and dry.     Findings: No erythema or rash.  Neurological:     Mental Status: He is lethargic.     Comments: Aphasic Leftward gaze Right facial droop Contracted right upper extremity Decreased sensation in the right      ED Results and Treatments Labs (all labs ordered are listed, but only abnormal results are displayed) Labs Reviewed  CBC - Abnormal; Notable for the following components:      Result Value   WBC 3.7 (*)    RBC 4.03 (*)    All other components within normal limits  COMPREHENSIVE METABOLIC PANEL - Abnormal; Notable for the following components:   Sodium 134 (*)    Glucose, Bld 100 (*)    Calcium 8.3 (*)    Total Protein 6.1 (*)    Albumin 3.2 (*)    All other components within normal limits  I-STAT CHEM 8, ED - Abnormal; Notable for the following components:   Calcium, Ion 1.05 (*)    All other components within normal limits  PROTIME-INR  APTT  DIFFERENTIAL  ETHANOL  HIV ANTIBODY (ROUTINE TESTING W REFLEX)  HEMOGLOBIN A1C  CBG MONITORING, ED                                                                                                                         EKG  EKG Interpretation  Date/Time:  Monday Aug 14 2022 05:40:19 EDT Ventricular Rate:  86 PR Interval:  153 QRS Duration: 90 QT Interval:  372 QTC Calculation: 445 R Axis:   98 Text Interpretation: Sinus rhythm Multiple ventricular premature complexes Probable anterolateral infarct, age indeterm Confirmed by Drema Pry [phone]) on  08/14/2022 7:35:44 AM       Radiology CT HEAD CODE STROKE WO CONTRAST  Addendum Date: 08/14/2022   ADDENDUM REPORT: 08/14/2022 05:51 ADDENDUM: ASPECTS 10. This was discussed by telephone with Lindzen on 08/14/2022 at 0536 hours. Electronically Signed   By: Odessa Fleming M.D.   On: 08/14/2022 05:51   Result Date: 08/14/2022 CLINICAL DATA:  Code stroke. Male of unknown age with right side weakness. EXAM: CT HEAD WITHOUT CONTRAST TECHNIQUE: Contiguous axial images were obtained from the base of the skull through the vertex without intravenous contrast. RADIATION DOSE REDUCTION: This exam was performed according to the departmental dose-optimization program which includes automated exposure control, adjustment of the mA and/or kV according to patient size and/or use of iterative reconstruction technique. COMPARISON:  None Available. FINDINGS: Brain: Bilateral subdural hematoma. Mostly hypodense left side subdural hematoma measures 4-5 mm in thickness at most levels. Smaller but more I so to hyperdense right side subdural hematoma is at most 3-4 mm, and less than that along the anterior frontal convexity. No significant midline shift. Mild mass effect on the ventricles more apparent on the left. Dural calcification along the falx. Small if any para falcine or tentorial component of blood. Basilar cisterns remain patent including the suprasellar cistern. No IVH or ventriculomegaly. No intra-axial or subarachnoid hemorrhage component is identified. No cortically based acute infarct identified. Chronic  appearing white matter encephalomalacia in the right frontal lobe. No cortical encephalomalacia identified. Vascular: No suspicious intracranial vascular hyperdensity. Calcified atherosclerosis at the skull base. Skull: No acute osseous abnormality identified. Sinuses/Orbits: Visualized paranasal sinuses and mastoids are well aerated. Other: Slight leftward gaze. Orbit and scalp soft tissues otherwise negative. ASPECTS  [facility] Stroke Program Early CT Score) Total score (0-10 with 10 being normal): Probably not applicable due to bilateral subdural hematoma. IMPRESSION: 1. Bilateral Subdural Hematoma, slightly larger (5 mm) and hypodense on the Left, more isodense and at most 3-4 mm on the Right. 2. Mild associated intracranial mass effect but no midline shift. Basilar cisterns remain patent. 3. No skull fracture or other acute intracranial abnormality identified. Small areas of chronic appearing white matter encephalomalacia in the right frontal lobe. #1 was communicated to Dr. Otelia Limes at 5:23 am on 08/14/2022 by text page via the [facility] messaging system. Electronically Signed: By: Odessa Fleming M.D. On: 08/14/2022 05:26   CT ANGIO HEAD NECK W WO CM W PERF (CODE STROKE)  Result Date: 08/14/2022 CLINICAL DATA:  67 year old male code stroke presentation, right side weakness. Bilateral subdural hematoma on plain head CT. EXAM: CT ANGIOGRAPHY HEAD AND NECK CT PERFUSION BRAIN. TECHNIQUE: Multidetector CT imaging of the head and neck was performed using the standard protocol during bolus administration of intravenous contrast. Multiplanar CT image reconstructions and MIPs were obtained to evaluate the vascular anatomy. Carotid stenosis measurements (when applicable) are obtained utilizing NASCET criteria, using the distal internal carotid diameter as the denominator. CT Perfusion performed during bolus contrast administration, analyzed with RAPID software. RADIATION DOSE REDUCTION: This exam was performed according to the departmental dose-optimization program which includes automated exposure control, adjustment of the mA and/or kV according to patient size and/or use of iterative reconstruction technique. CONTRAST:  OMNIPAQUE IOHEXOL 350 MG/ML SOLN COMPARISON:  Plain head CT 0519 hours today. Previous CTA head and neck 03/01/2021 FINDINGS: CTA NECK Skeleton: Absent maxillary dentition. Carious mandible dentition. Previous  sternotomy. Chronic cervical spine degeneration. No acute osseous abnormality identified. Upper chest: Some centrilobular emphysema.  Evidence of prior CABG. Other neck: No acute finding. Aortic arch: 3 vessel arch.  Relatively mild arch atherosclerosis. Right carotid system: No brachiocephalic artery or right CCA stenosis. Calcified plaque at the posterior right ICA origin and bulb with no significant stenosis. Left carotid system: Mild left CCA plaque, no significant stenosis. Left ICA origin and bulb soft and calcified plaque with no significant stenosis. Additional mild calcified plaque just below the skull base with no significant stenosis. Vertebral arteries: No significant proximal subclavian artery stenosis. Normal vertebral artery origins. Left vertebral artery appears mildly dominant. Both are patent to the skull base with no significant stenosis. CTA HEAD Posterior circulation: Patent distal vertebral arteries, PICA origins and vertebrobasilar junction with no significant stenosis. Patent basilar artery. SCA and PCA origins remain patent. Mild left and up to moderate right PCA P1 and P2 segment irregularity is noted and appears increased since 2022. But bilateral distal PCA branches remain patent. There is moderate right P2 segment stenosis on series 12, image 18. Anterior circulation: Both ICA siphons are patent but heavily calcified. Severe right cavernous segment calcified plaque and stenosis (series 8, image 85) appears stable since 2022. The right ICA terminus remains patent. Left ICA siphon bulky calcified plaque also with severe cavernous segment stenosis on series 8, image 90, which has not significantly changed. Left ICA terminus remains patent. MCA and ACA origins remain patent. Bilateral ACA branches, anterior communicating artery,  and right MCA M1 appear stable since 2022. ACA branches are within normal limits. There is mild to moderate right MCA bifurcation irregularity and stenosis. Right  MCA branches are stable. There is new left MCA distal M1 segment occlusion (series 10, image 23 and series 11, image 19. Compared to the 2022 CTA, there is relatively poor early MCA branch collateral enhancement. Only the dominant posterior branch is somewhat spared. Venous sinuses: Early contrast timing, grossly patent. Anatomic variants: None significant. Other findings: No contrast extravasation identified with either subdural hematoma. CT PERFUSION: ASPECTS 10 at 0519 hours today. Estimated infarct core 38 mL based on CBF less than 30%. Estimated penumbra 105 mL based on T-max greater than 6 S. Mismatch volume 67 mL, mismatch ratio Ratio 2.8. Hypoperfusion index 0.6. Left MCA territory affected. Review of the MIP images confirms the above findings Preliminary results of the above were communicated to Dr. Otelia Limes at 5:36 am on 08/14/2022 by text page via the [facility] messaging system. And also discussed by telephone at that same time. IMPRESSION: 1. Positive for Left MCA distal M1 ELVO. 2. CT Perfusion estimates 38 mL left MCA infarct core. ASPECTS 10 at 0519 hours today. 105 mL volume of left MCA oligemia/penumbra. Hypoperfusion index 0.6. 3. The above Study discussed by telephone with Dr. Otelia Limes on 08/14/2022 at 0536 hours. 4. Underlying advanced intracranial atherosclerosis including chronic severe bilateral ICA siphon stenosis due to bulky cavernous segment plaque. Right MCA distal M1/bifurcation mild-to-moderate stenosis appears progressed since the 2022 CTA. Similar Right PCA P2 segment moderate stenosis also appears progressed. 5. Comparatively mild extracranial atherosclerosis. No significant stenosis in the neck. 6. Plain Head CT, bilateral subdural hematoma detailed separately today. 7. Emphysema (ICD10-J43.9). Aortic Atherosclerosis (ICD10-I70.0). Prior CABG. Electronically Signed   By: Odessa Fleming M.D.   On: 08/14/2022 05:50    Medications Ordered in ED Medications  sodium chloride flush (NS) 0.9 %  injection 3 mL (has no administration in time range)   stroke: early stages of recovery book (has no administration in time range)  0.9 %  sodium chloride infusion (has no administration in time range)  acetaminophen (TYLENOL) tablet 650 mg (has no administration in time range)    Or  acetaminophen (TYLENOL) 160 MG/5ML solution 650 mg (has no administration in time range)    Or  acetaminophen (TYLENOL) suppository 650 mg (has no administration in time range)  senna-docusate (Senokot-S) tablet 1 tablet (has no administration in time range)  nitroGLYCERIN 25 mcg in sodium chloride (PF) 0.9 % 59.71 mL (25 mcg/mL) syringe (25 mcg Intra-arterial Given 08/14/22 0647)  iohexol (OMNIPAQUE) 350 MG/ML injection 100 mL (100 mLs Intravenous Contrast Given 08/14/22 0532)  iohexol (OMNIPAQUE) 300 MG/ML solution 150 mL (65 mLs Intra-arterial Contrast Given 08/14/22 0709)   Procedures .Critical Care  Performed by: Nira Conn, MD Authorized by: Nira Conn, MD   Critical care provider statement:    Critical care time (minutes):  30   Critical care time was exclusive of:  Separately billable procedures and treating other patients   Critical care was necessary to treat or prevent imminent or life-threatening deterioration of the following conditions:  CNS failure or compromise   Critical care was time spent personally by me on the following activities:  Development of treatment plan with patient or surrogate, discussions with consultants, evaluation of patient's response to treatment, examination of patient, obtaining history from patient or surrogate, review of old charts, re-evaluation of patient's condition, pulse oximetry, ordering and review of radiographic  studies, ordering and review of laboratory studies and ordering and performing treatments and interventions   Care discussed with: admitting provider     (including critical care time) Medical Decision Making / ED Course  Click  here for ABCD2, HEART and other calculators  Medical Decision Making Amount and/or Complexity of Data Reviewed Labs: ordered. Radiology: ordered.  Risk Prescription drug management. Decision regarding hospitalization.    The patient presents to the ED for the following: Aphasia right-sided deficits  This involves an extensive number of treatment options, and is a complaint that carries with it a high risk of complications and morbidity. The differential diagnosis includes but not limited to:  Ischemic versus hemorrhagic stroke, seizure, electrolyte/metabolic encephalopathy.  Hospitalization considered:  Yes  Initial intervention:  Taken immediately to CT scan  Work up Interpretation and Management:  Cardiac Monitoring/EKG: EKG without acute ischemic changes, dysrhythmias, blocks  Laboratory Tests ordered listed below with my independent interpretation: CBC without leukocytosis or anemia Metabolic panel without significant electrolyte derangements or renal sufficiency EtOH negative    Imaging Studies ordered listed below with my independent interpretation:   ED Course:  Clinical Course as of 08/14/22 0737  Mon Aug 14, 2022  0544 CT with bilateral SDH. Not a TNK candidate. CTA with M1 occlusion on left. Will plan to take to IR for thrombectomy [PC]    Clinical Course User Index [PC] Dariela Stoker, Amadeo Garnet, MD    Final Clinical Impression(s) / ED Diagnoses Final diagnoses:  Cerebral infarction due to embolism of left middle cerebral artery (HCC)  SDH (subdural hematoma) (HCC)           This chart was dictated using voice recognition software.  Despite best efforts to proofread,  errors can occur which can change the documentation meaning.    Nira Conn, MD 08/14/22 [phone]

## 2022-08-14 NOTE — Procedures (Signed)
INR.  Status post bilateral common carotid and right vertebral artery angiograms.  Right CFA approach.  Findings.  1.  Occluded left middle cerebral artery distal M1 segment.  Status post complete revascularization of occluded left middle cerebral artery M1 segment with 1 pass with a 3 mm x 40 mm Solitaire X retrieval device, and contact  aspiration achieving a TICI 3 revascularization.  Moderate sized carotid WEB  noted in the left ICA bulb region.  Bilateral mass effect over  cerebral convexities left greater than right, secondary to chronic subdural hematomas.  Post CT brain.  No ICH  Manual compression held at the right groin puncture site with quick clot for 20 minutes.  Distal pulses dopplerable bilateral PTs unchanged.  dopplerable Lt DP.  Patient left intubated to protect airway as patient was unresponsive prior to intubation.  Fatima Sanger MD

## 2022-08-14 NOTE — Progress Notes (Signed)
Pt transported from 4N16 to MRI and back without any complications. RN at bedside.  

## 2022-08-14 NOTE — Transfer of Care (Signed)
Immediate Anesthesia Transfer of Care Note  Patient: [name]  Procedure(s) Performed: IR WITH ANESTHESIA  Patient Location: ICU  Anesthesia Type:General  Level of Consciousness: sedated and unresponsive  Airway & Oxygen Therapy: Patient remains intubated per anesthesia plan and Patient placed on Ventilator (see vital sign flow sheet for setting)  Post-op Assessment: Report given to RN and Post -op Vital signs reviewed and stable  Post vital signs: Reviewed and stable  Last Vitals:  Vitals Value Taken Time  BP    Temp    Pulse 61 08/14/22 0803  Resp 16 08/14/22 0805  SpO2 96 % 08/14/22 0803  Vitals shown include unvalidated device data.  Last Pain:  Vitals:   08/14/22 0510  TempSrc: Axillary         Complications: No notable events documented.

## 2022-08-14 NOTE — Anesthesia Preprocedure Evaluation (Signed)
Anesthesia Evaluation  Patient identified by MRN, date of birth, ID band Patient confused    Reviewed: Allergy & Precautions, H&P , NPO status , Patient's Chart, lab work & pertinent test results  Airway Mallampati: Unable to assess       Dental no notable dental hx. (+) Poor Dentition, Dental Advisory Given   Pulmonary neg pulmonary ROS   Pulmonary exam normal breath sounds clear to auscultation       Cardiovascular negative cardio ROS  Rhythm:Regular Rate:Normal     Neuro/Psych CVA, Residual Symptoms  negative psych ROS   GI/Hepatic negative GI ROS, Neg liver ROS,,,(+)     substance abuse    Endo/Other  negative endocrine ROS    Renal/GU negative Renal ROS  negative genitourinary   Musculoskeletal   Abdominal   Peds  Hematology negative hematology ROS (+)   Anesthesia Other Findings   Reproductive/Obstetrics negative OB ROS                             Anesthesia Physical Anesthesia Plan  ASA: 3 and emergent  Anesthesia Plan: General   Post-op Pain Management:    Induction: Intravenous, Rapid sequence and Cricoid pressure planned  PONV Risk Score and Plan: 2 and Ondansetron and Dexamethasone  Airway Management Planned: Oral ETT  Additional Equipment: Arterial line  Intra-op Plan:   Post-operative Plan: Possible Post-op intubation/ventilation  Informed Consent: I have reviewed the patients History and Physical, chart, labs and discussed the procedure including the risks, benefits and alternatives for the proposed anesthesia with the patient or authorized representative who has indicated his/her understanding and acceptance.     Dental advisory given  Plan Discussed with: CRNA  Anesthesia Plan Comments:        Anesthesia Quick Evaluation

## 2022-08-14 NOTE — Anesthesia Procedure Notes (Signed)
Procedure Name: Intubation Date/Time: 08/14/2022 6:13 AM  Performed by: Presli Fanguy T, CRNAPre-anesthesia Checklist: Patient identified, Emergency Drugs available, Suction available and Patient being monitored Patient Re-evaluated:Patient Re-evaluated prior to induction Oxygen Delivery Method: Circle system utilized Preoxygenation: Pre-oxygenation with 100% oxygen Induction Type: IV induction, Rapid sequence and Cricoid Pressure applied Ventilation: Mask ventilation without difficulty Laryngoscope Size: Mac and 4 Grade View: Grade I Tube type: Oral Tube size: 7.5 mm Number of attempts: 1 Airway Equipment and Method: Stylet and Oral airway Placement Confirmation: ETT inserted through vocal cords under direct vision, positive ETCO2 and breath sounds checked- equal and bilateral Secured at: 22 cm Tube secured with: Tape Dental Injury: Teeth and Oropharynx as per pre-operative assessment

## 2022-08-14 NOTE — Procedures (Signed)
Extubation Procedure Note  Patient Details:   Name: TYNELL HOLLINGHEAD DOB: [birth year] MRN: [id number]   Airway Documentation:    Vent end date: (not recorded) Vent end time: (not recorded)   Evaluation  O2 sats: stable throughout Complications: No apparent complications Patient did tolerate procedure well. Bilateral Breath Sounds: Clear, Diminished   Yes  Pt self extubated. Pt is stable at this time and is on RA, sats currently 96% with stable VS. MD notified.   Kerri Perches 08/14/2022, 5:46 PM

## 2022-08-14 NOTE — Progress Notes (Signed)
PT Cancellation Note  Patient Details Name: [name] MRN: [id number] DOB: Aug 20, 1955   Cancelled Treatment:    Reason Eval/Treat Not Completed: Medical issues which prohibited therapy; noted pt still intubated and sedated after procedure with possible extubation later today.  PT will follow up when medically ready.   Elray Mcgregor 08/14/2022, 8:55 AM Sheran Lawless, PT Acute Rehabilitation Services Office:[phone] 08/14/2022

## 2022-08-14 NOTE — TOC Initial Note (Signed)
Transition of Care [facility]) - Initial/Assessment Note    Patient Details  Name: [name] MRN: [id number] Date of Birth: Nov 14, 1955  [facility] CM/SW Contact:    Mearl Latin, LCSW Phone Number: 08/14/2022, 5:05 PM  Clinical Narrative:                 Transitions of Care following for any potential discharge needs. Patient is currently intubated.     Barriers to Discharge: Continued Medical Work up   Patient Goals and CMS Choice            Expected Discharge Plan and Services                                              Prior Living Arrangements/Services     Patient language and need for interpreter reviewed:: Yes        Need for Family Participation in Patient Care: Yes (Comment) Care giver support system in place?: Yes (comment)   Criminal Activity/Legal Involvement Pertinent to Current Situation/Hospitalization: No - Comment as needed  Activities of Daily Living      Permission Sought/Granted                  Emotional Assessment   Attitude/Demeanor/Rapport: Unable to Assess Affect (typically observed): Unable to Assess Orientation: :  (Intubated)   Psych Involvement: No (comment)  Admission diagnosis:  SDH (subdural hematoma) (HCC) [S06.5XAA] Stroke (cerebrum) (HCC) [I63.9] Cerebral infarction due to embolism of left middle cerebral artery [facility]) [I63.412] Middle cerebral artery embolism, left [I66.02] Patient Active Problem List   Diagnosis Date Noted   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

## 2022-08-14 NOTE — Code Documentation (Signed)
Stroke Response Nurse Documentation Code Documentation  [name] is a 67 y.o. male arriving to [facility]  via [facility] on 5/20 with past medical hx of CAD with CABG 2010, CHF, stroke with LUE weakness, cocaine abuse, ICM. On aspirin 81 mg daily. Code stroke was activated by EMS.   Patient from residence used for substance abuse and exchange where he was LKW at 0430 and now complaining of aphasia and right sided weakness.    Stroke team at the bedside on patient arrival. Labs drawn and patient cleared for CT by Dr. Eudelia Bunch. Patient to CT with team. NIHSS 27, see documentation for details and code stroke times. Patient with decreased LOC, disoriented, not following commands, left gaze preference , right hemianopia, right facial droop, right arm weakness, right leg weakness, right decreased sensation, Global aphasia , dysarthria , and right neglect on exam. The following imaging was completed:  CT Head, CTA, and CTP. Patient is not a candidate for IV Thrombolytic due to SDH. Patient is a candidate for IR due to LVO.   Care Plan: Thrombectomy.   Bedside handoff with IR RN.    Rose Fillers  Rapid Response RN

## 2022-08-15 ENCOUNTER — Encounter (HOSPITAL_COMMUNITY): Payer: Self-pay | Admitting: Interventional Radiology

## 2022-08-15 DIAGNOSIS — R451 Restlessness and agitation: Secondary | ICD-10-CM

## 2022-08-15 DIAGNOSIS — I5023 Acute on chronic systolic (congestive) heart failure: Secondary | ICD-10-CM

## 2022-08-15 DIAGNOSIS — I63412 Cerebral infarction due to embolism of left middle cerebral artery: Principal | ICD-10-CM

## 2022-08-15 DIAGNOSIS — F1910 Other psychoactive substance abuse, uncomplicated: Secondary | ICD-10-CM

## 2022-08-15 DIAGNOSIS — I63312 Cerebral infarction due to thrombosis of left middle cerebral artery: Secondary | ICD-10-CM

## 2022-08-15 DIAGNOSIS — I639 Cerebral infarction, unspecified: Secondary | ICD-10-CM | POA: Diagnosis not present

## 2022-08-15 LAB — LIPID PANEL
Cholesterol: 208 mg/dL — ABNORMAL HIGH (ref 0–200)
HDL: 68 mg/dL (ref >40)
LDL Cholesterol: 126 mg/dL — ABNORMAL HIGH (ref 0–99)
Total CHOL/HDL Ratio: 3.1 RATIO
Triglycerides: 70 mg/dL (ref <150)
VLDL: 14 mg/dL (ref 0–40)

## 2022-08-15 MED ORDER — FAMOTIDINE 20 MG PO TABS: 20.0000 mg | ORAL_TABLET | Freq: Two times a day (BID) | ORAL | Status: DC

## 2022-08-15 MED ORDER — POLYETHYLENE GLYCOL 3350 17 G PO PACK: 17.0000 g | PACK | Freq: Every day | ORAL | Status: DC

## 2022-08-15 MED ORDER — HALOPERIDOL 0.5 MG PO TABS: 1.0000 mg | ORAL_TABLET | Freq: Three times a day (TID) | ORAL | Status: DC | PRN

## 2022-08-15 MED ORDER — DOCUSATE SODIUM 100 MG PO CAPS
100.0000 mg | ORAL_CAPSULE | Freq: Two times a day (BID) | ORAL | Status: DC
  Administered 2022-08-15 – 2022-08-17 (×4): 100 mg via ORAL
  Filled 2022-08-15 (×4): qty 1

## 2022-08-15 MED ORDER — HALOPERIDOL LACTATE 5 MG/ML IJ SOLN
1.0000 mg | Freq: Three times a day (TID) | INTRAMUSCULAR | Status: DC | PRN
  Filled 2022-08-15: qty 1

## 2022-08-15 NOTE — Progress Notes (Signed)
Patient arrived in the unit at 2145pm from 4N-ICU; A&Ox3; denied of acute pain; on RA; initiated telemetry monitor; and will continue to monitor.

## 2022-08-15 NOTE — Progress Notes (Addendum)
NAME:  [name], MRN:  [id number], DOB:  1955/09/07, LOS: 1 ADMISSION DATE:  08/14/2022, CONSULTATION DATE:  08/14/2022 REFERRING MD:  Dr. Otelia Limes - Neuro, CHIEF COMPLAINT: Ventilator support in the setting of CVA  History of Present Illness:  NASIIR SCALA is a 67 year old male with medical history significant for prior stroke with LUE weakness, non-Hodgkin's lymphoma, CAD s/p CABG x 4 in 2010, CHF, and substance abuse who presented to ED via EMS with sudden onset right-sided weakness, right gaze, and aphasic, last known normal 0430.  Per EMS it appears patient was found at a residential building use for purposes of substance abuse and exchange.  Code stroke was initiated on arrival.  Head CT on arrival with bilateral subdural hematomas slightly larger, no fracture.  CTA head and neck positive for left MCA and distal M1 ELVO.  Neurology and neurointerventional radiologist discussed case and decision made to proceed with thrombectomy with two-physician consent as patient was unable to make medical decisions and no family was present.  Patient was intubated prior to IR procedure and remained ventilated postprocedure, PCCM consulted for ventilator management.  Pertinent  Medical History  Prior stroke with LUE weakness, non-Hodgkin's lymphoma, CAD s/p CABG x 4 in 2010, CHF, and substance abuse  Significant Hospital Events: Including procedures, antibiotic start and stop dates in addition to other pertinent events   5/20 presented with right hemiplegia, aphasia, and right gaze, CTA positive for left MCA and distal M1 LVO.  Taken for emergent thrombectomy, remained intubated postprocedure, PCCM consulted. Extubated   Interim History / Subjective:  Extubated yesterday late afternoon  Agitated this morning and aggressive   Objective   Blood pressure 120/75, pulse 61, temperature 98.3 F (36.8 C), temperature source Axillary, resp. rate 16, height 5\' 6"  (1.676 m), weight 49.9 kg, SpO2 96 %.     Vent Mode: CPAP;PSV FiO2 (%):  [40 %] 40 % Set Rate:  [16 bmp] 16 bmp Vt Set:  [510 mL] 510 mL PEEP:  [5 cmH20] 5 cmH20 Pressure Support:  [10 cmH20] 10 cmH20 Plateau Pressure:  [12 cmH20] 12 cmH20   Intake/Output Summary (Last 24 hours) at 08/15/2022 1145 Last data filed at 08/15/2022 0800 Gross per 24 hour  Intake 1492.88 ml  Output 3200 ml  Net -1707.12 ml   Filed Weights   08/14/22 0500 08/14/22 0603  Weight: 49.4 kg 49.9 kg    Examination: General: Ill older adult M  Psych: agitated, yelling, hitting/punching  Neuro: Awakens to voice moving purposefully non participatory with vocal commands.  HEENT: NCAT poor dentition  CV:  rr PULM: Even and unlabored, clear but duration of auscultation limited by exam non compliance   GI: non compliant with exam GU: non compliant with exam  Extremities:  non compliant with exam  Skin: clean and dry   Resolved Hospital Problem list     Assessment & Plan:    agitation  Polysubstance use  -cocaine mj tobacco  P: -cessation counseling  -redirect,  verba, de-escalation as able -is currently requiring BUE soft restraints giving punching, will cont to eval for de-escalation   Acute L MCA CVA Bilateral SDH Hx CVA, residual LUE weakness  -TICI3 revasc of L MCA M1 P: -SBP goal 120-160  -SLP, PT, OT -neuro protective measures  -statin, secondary stroke prevention per neuro    CAD, s/p CABGx4 (2010) Acute on chronic HFrEF  -12/22 LVEF 40-45, 5/24 LVEF 20-25 & unable to r/o  P: -cardiac monitoring -goal euvolemia  -GDMT  when taking POs  -really needs to stop using cocaine  Non hodgkins hymphoma -Diagnosed February 2021, now 89-month observation per oncology.  Port-A-Cath removed 07/26/2022 P: -outpt fu   Likely stable to transfer out of ICU   Best Practice (right click and "Reselect all SmartList Selections" daily)   Diet/type: tubefeeds DVT prophylaxis: SCD GI prophylaxis: PPI Lines: N/A Foley:  N/A Code Status:   full code Last date of multidisciplinary goals of care discussion: Per primary   Labs   CBC: Recent Labs  Lab 08/14/22 0515 08/14/22 0517  WBC 3.7*  --   NEUTROABS 1.8  --   HGB 13.3 13.6  HCT 40.0 40.0  MCV 99.3  --   PLT 207  --     Basic Metabolic Panel: Recent Labs  Lab 08/14/22 0515 08/14/22 0517  NA 134* 139  K 3.7 3.8  CL 105 106  CO2 22  --   GLUCOSE 100* 99  BUN 12 13  CREATININE 0.99 0.90  CALCIUM 8.3*  --    GFR: Estimated Creatinine Clearance: 57 mL/min (by C-G formula based on SCr of 0.9 mg/dL). Recent Labs  Lab 08/14/22 0515  WBC 3.7*    Liver Function Tests: Recent Labs  Lab 08/14/22 0515  AST 16  ALT 14  ALKPHOS 54  BILITOT 0.7  PROT 6.1*  ALBUMIN 3.2*   No results for input(s): "LIPASE", "AMYLASE" in the last 168 hours. No results for input(s): "AMMONIA" in the last 168 hours.  ABG    Component Value Date/Time   TCO2 25 08/14/2022 0517     Coagulation Profile: Recent Labs  Lab 08/14/22 0515  INR 1.1    Cardiac Enzymes: No results for input(s): "CKTOTAL", "CKMB", "CKMBINDEX", "TROPONINI" in the last 168 hours.  HbA1C: Hgb A1c MFr Bld  Date/Time Value Ref Range Status  08/14/2022 12:05 PM 5.6 4.8 - 5.6 % Final    Comment:    (NOTE) Pre diabetes:          5.7%-6.4%  Diabetes:              >6.4%  Glycemic control for   <7.0% adults with diabetes     CBG: Recent Labs  Lab 08/14/22 0510  GLUCAP 98   CCT: na    Tessie Fass MSN, AGACNP-BC [facility] Amion for pager  08/15/2022, 11:45 AM

## 2022-08-15 NOTE — Evaluation (Signed)
Occupational Therapy Evaluation Patient Details Name: [name] MRN: [id number] DOB: [birth year] Today's Date: 08/15/2022   History of Present Illness Pt is a 67 y/o male presenting on 5/19 with acute onset of R hemiplegia and aphasia. S/P TICI 3 revascularization of L MCA M1. MRI with punctate acute infarct of L corona radiata, acute cortical infarction in L occipital lobe, and unchanged bilateral subdural hematomas. Extubated 5/20. 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   Clinical Impression   Patient admitted for above and presents with problem list below.  Evaluation limited due to agitation and limited engagement, but he does reports living alone and ambulating.  Today he was able to wash his face using his R hand, but would require max-total assist for all other self care- not tested today due to limited participation.  He dangled on EOB with min guard assist with limited tolerance, VSS.  Pt with decreased awareness, problem solving and recall, voicing wanting food but not understanding the need to try water first.  Will follow acutely, anticipate further OT services acutely and after dc at inpatient setting with <3hrs/day pending progress.       Recommendations for follow up therapy are one component of a multi-disciplinary discharge planning process, led by the attending physician.  Recommendations may be updated based on patient status, additional functional criteria and insurance authorization.   Assistance Recommended at Discharge Frequent or constant Supervision/Assistance  Patient can return home with the following Two people to help with walking and/or transfers;A lot of help with bathing/dressing/bathroom;Assistance with cooking/housework;Direct supervision/assist for medications management;Direct supervision/assist for financial management;Assist for transportation;Help with stairs or ramp for entrance     Functional Status Assessment  Patient has had a recent decline in their functional status and demonstrates the ability to make significant improvements in function in a reasonable and predictable amount of time.  Equipment Recommendations  BSC/3in1;Other (comment) (defer)    Recommendations for Other Services       Precautions / Restrictions Precautions Precautions: Fall Restrictions Weight Bearing Restrictions: No      Mobility Bed Mobility Overal bed mobility: Needs Assistance Bed Mobility: Sidelying to Sit, Sit to Sidelying   Sidelying to sit: Max assist     Sit to sidelying: Mod assist General bed mobility comments: initation required to bring legs off EOB and elevate trunk, pt returned to sidelying with assist for LB only    Transfers                   General transfer comment: deferred      Balance Overall balance assessment: Needs assistance Sitting-balance support: Single extremity supported, Feet unsupported Sitting balance-Leahy Scale: Fair Sitting balance - Comments: pt propped self on L forearm, min guard for safety                                   ADL either performed or assessed with clinical judgement   ADL Overall ADL's : Needs assistance/impaired     Grooming: Wash/dry face;Set up;Bed level           Upper Body Dressing : Total assistance;Bed level   Lower Body Dressing: Total assistance;Bed level     Toilet Transfer Details (indicate cue type and reason): deferred         Functional mobility during ADLs: Maximal assistance General ADL Comments: EOB only, limited  by particiation and agitation     Vision   Additional Comments: did not assess     Perception     Praxis      Pertinent Vitals/Pain Pain Assessment Pain Assessment: Faces Faces Pain Scale: No hurt Pain Intervention(s): Monitored during session     Hand Dominance Right   Extremity/Trunk Assessment Upper Extremity Assessment Upper  Extremity Assessment: Difficult to assess due to impaired cognition (able to move BUEs, washing face with RUE but limited assessment)   Lower Extremity Assessment Lower Extremity Assessment: Defer to PT evaluation       Communication Communication Communication: No difficulties   Cognition Arousal/Alertness: Awake/alert Behavior During Therapy: Agitated, Flat affect Overall Cognitive Status: Difficult to assess                                 General Comments: pt able to follow some simple commands, mostly limited by agitation and unwillingness to participate. Perseverates on wanting to have food, but not problem solving through needing to try water before getting a meal     General Comments  limited session due to agitation, VSS RN present    Exercises     Shoulder Instructions      Home Living Family/patient expects to be discharged to:: Private residence Living Arrangements: Alone                               Additional Comments: unsure, pt with limited engagement      Prior Functioning/Environment Prior Level of Function : Independent/Modified Independent                        OT Problem List: Decreased activity tolerance;Impaired balance (sitting and/or standing);Decreased knowledge of precautions;Decreased knowledge of use of DME or AE;Decreased safety awareness;Decreased cognition      OT Treatment/Interventions: Self-care/ADL training;Therapeutic exercise;DME and/or AE instruction;Therapeutic activities;Patient/family education;Balance training;Cognitive remediation/compensation    OT Goals(Current goals can be found in the care plan section) Acute Rehab OT Goals Patient Stated Goal: get some food OT Goal Formulation: With patient Time For Goal Achievement: 08/29/22 Potential to Achieve Goals: Good  OT Frequency: Min 2X/week    Co-evaluation PT/OT/SLP Co-Evaluation/Treatment: Yes Reason for Co-Treatment: For  patient/therapist safety;To address functional/ADL transfers;Necessary to address cognition/behavior during functional activity   OT goals addressed during session: ADL's and self-care      AM-PAC OT "6 Clicks" Daily Activity     Outcome Measure Help from another person eating meals?: Total Help from another person taking care of personal grooming?: A Lot Help from another person toileting, which includes using toliet, bedpan, or urinal?: Total Help from another person bathing (including washing, rinsing, drying)?: Total Help from another person to put on and taking off regular upper body clothing?: Total Help from another person to put on and taking off regular lower body clothing?: Total 6 Click Score: 7   End of Session Nurse Communication: Mobility status  Activity Tolerance: Treatment limited secondary to agitation Patient left: in bed;with call bell/phone within reach;with bed alarm set;with restraints reapplied  OT Visit Diagnosis: Other abnormalities of gait and mobility (R26.89);Muscle weakness (generalized) (M62.81);Other symptoms and signs involving cognitive function                Time: 1130-1148 OT Time Calculation (min): 18 min Charges:  OT General Charges $OT Visit: 1 Visit  OT Evaluation $OT Eval Moderate Complexity: 1 Mod  Barry Brunner, OT Acute Rehabilitation Services Office [phone]   Chancy Milroy 08/15/2022, 12:45 PM

## 2022-08-15 NOTE — Progress Notes (Signed)
Chief Complaint: Patient was seen today for post CVA intervention   Supervising Physician: Julieanne Cotton  Patient Status: [facility] - In-pt  Subjective: S/p angiogram with revasc (L)MCA occlusion. Pt extubated but agitated when awoken. "I'm fine" and "Leave me alone"  Objective: Physical Exam: BP 120/75   Pulse 61   Temp 98.3 F (36.8 C) (Axillary)   Resp 16   Ht 5\' 6"  (1.676 m)   Wt 110 lb 0.2 oz (49.9 kg)   SpO2 96%   BMI 17.76 kg/m  Pt awake but NOT cooperative to neuro exam. Not willing to follow commands. Able to check right groin, soft, NT, no hematoma Feet warm.   Current Facility-Administered Medications:     stroke: early stages of recovery book, , Does not apply, Once, Caryl Pina, MD   0.9 %  sodium chloride infusion, , Intravenous, Continuous, Deveshwar, Sanjeev, MD, Last Rate: 75 mL/hr at 08/15/22 0800, Infusion Verify at 08/15/22 0800   acetaminophen (TYLENOL) tablet 650 mg, 650 mg, Oral, Q4H PRN **OR** acetaminophen (TYLENOL) 160 MG/5ML solution 650 mg, 650 mg, Per Tube, Q4H PRN **OR** acetaminophen (TYLENOL) suppository 650 mg, 650 mg, Rectal, Q4H PRN, Caryl Pina, MD   aspirin suppository 300 mg, 300 mg, Rectal, Daily, 300 mg at 08/14/22 2058 **OR** aspirin EC tablet 81 mg, 81 mg, Oral, Daily, Hetty Blend C, NP   Chlorhexidine Gluconate Cloth 2 % PADS 6 each, 6 each, Topical, Q0600, Lynnell Catalan, MD, 6 each at 08/14/22 0935   clevidipine (CLEVIPREX) infusion 0.5 mg/mL, 0-21 mg/hr, Intravenous, Continuous, Deveshwar, Sanjeev, MD, Stopped at 08/14/22 1058   docusate sodium (COLACE) capsule 100 mg, 100 mg, Oral, BID, Francena Hanly, RPH   famotidine (PEPCID) tablet 20 mg, 20 mg, Oral, BID, Francena Hanly, RPH   fentaNYL (SUBLIMAZE) bolus via infusion 25-100 mcg, 25-100 mcg, Intravenous, Q15 min PRN, Tiburcio Pea, Whitney D, NP   fentaNYL (SUBLIMAZE) injection 25 mcg, 25 mcg, Intravenous, Once, Harris, Whitney D, NP   polyethylene glycol (MIRALAX /  GLYCOLAX) packet 17 g, 17 g, Oral, Daily, Francena Hanly, [facility]  Labs: CBC Recent Labs    08/14/22 0515 08/14/22 0517  WBC 3.7*  --   HGB 13.3 13.6  HCT 40.0 40.0  PLT 207  --    BMET Recent Labs    08/14/22 0515 08/14/22 0517  NA 134* 139  K 3.7 3.8  CL 105 106  CO2 22  --   GLUCOSE 100* 99  BUN 12 13  CREATININE 0.99 0.90  CALCIUM 8.3*  --    LFT Recent Labs    08/14/22 0515  PROT 6.1*  ALBUMIN 3.2*  AST 16  ALT 14  ALKPHOS 54  BILITOT 0.7   PT/INR Recent Labs    08/14/22 0515  LABPROT 13.9  INR 1.1     Studies/Results: ECHOCARDIOGRAM COMPLETE  Result Date: 08/14/2022    ECHOCARDIOGRAM REPORT   Patient Name:   [name] Date of Exam: 08/14/2022 Medical Rec #:  [id number]       Height:       66.0 in Accession #:    [id number]      Weight:       110.0 lb Date of Birth:  29-Aug-1955       BSA:          1.551 m Patient Age:    67 years        BP:           141/91  mmHg Patient Gender: M               HR:           70 bpm. Exam Location:  Inpatient Procedure: 2D Echo, 3D Echo, Cardiac Doppler, Color Doppler and Strain Analysis Indications:    Stroke I63.9  History:        Patient has prior history of Echocardiogram examinations, most                 recent 03/02/2021. CAD, Prior CABG; Stroke. 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  Sonographer:    Dondra Prader RVT RCS Referring Phys: ERIC LINDZEN IMPRESSIONS  1. Global severe hypokinesis with akinesis of apex. Left ventricular ejection fraction, by estimation, is 20 to 25%. The left ventricle has severely decreased function. The left ventricle demonstrates global hypokinesis. The left ventricular internal cavity size was mildly dilated. Left ventricular diastolic parameters are indeterminate. The average left ventricular global longitudinal strain is -9.4 %. The global longitudinal strain is  abnormal.  2. Right ventricular systolic function is normal. The right ventricular size is normal.  3. Left atrial size was moderately dilated.  4. Right atrial size was mildly dilated.  5. The mitral valve is grossly normal. Trivial mitral valve regurgitation. No evidence of mitral stenosis.  6. The aortic valve is grossly normal. Aortic valve regurgitation is not visualized. No aortic stenosis is present.  7. The inferior vena cava is dilated in size with <50% respiratory variability, suggesting right atrial pressure of 15 mmHg. Comparison(s): Changes from prior study are noted. LV EF decreased compared to prior. Conclusion(s)/Recommendation(s): LVEF severely reduced, worse compared to prior. While there is no LV apical thrombus seen on imaging, would consider limited echo with echo contrast to definitively exclude LV apical thrombus. Will communicate recommendations to primary team. FINDINGS  Left Ventricle: Global severe hypokinesis with akinesis of apex. Left ventricular ejection fraction, by estimation, is 20 to 25%. The left ventricle has severely decreased function. The left ventricle demonstrates global hypokinesis. The average left ventricular global longitudinal strain is -9.4 %. The global longitudinal strain is abnormal. The left ventricular internal cavity size was mildly dilated. There is no left ventricular hypertrophy. Left ventricular diastolic parameters are indeterminate. Right Ventricle: The right ventricular size is normal. Right vetricular wall thickness was not well visualized. Right ventricular systolic function is normal. Left Atrium: Left atrial size was moderately dilated. Right Atrium: Right atrial size was mildly dilated. Pericardium: There is no evidence of pericardial effusion. Mitral Valve: The mitral valve is grossly normal. Trivial mitral valve regurgitation. No evidence of mitral valve stenosis. Tricuspid Valve: The tricuspid valve is not well visualized. Tricuspid valve  regurgitation is trivial. No evidence of tricuspid stenosis. Aortic Valve: The aortic valve is grossly normal. Aortic valve regurgitation is not visualized. No aortic stenosis is present. Aortic valve mean gradient measures 1.0 mmHg. Aortic valve peak gradient measures 2.2 mmHg. Aortic valve area, by VTI measures 3.48 cm. Pulmonic Valve: The pulmonic valve was not well visualized. Pulmonic valve regurgitation is not visualized. No evidence of pulmonic stenosis. Aorta: The aortic root, ascending aorta, aortic arch and descending aorta are all structurally normal, with no evidence of dilitation or obstruction. Venous: The  inferior vena cava is dilated in size with less than 50% respiratory variability, suggesting right atrial pressure of 15 mmHg. IAS/Shunts: The interatrial septum was not well visualized.  LEFT VENTRICLE PLAX 2D LVIDd:         5.50 cm   Diastology LVIDs:         4.70 cm   LV e' medial:   0.05 cm/s LV PW:         0.80 cm   LV E/e' medial: 7.8 LV IVS:        0.70 cm LVOT diam:     2.20 cm   2D Longitudinal Strain LV SV:         41        2D Strain GLS (A2C):   -10.6 % LV SV Index:   27        2D Strain GLS (A3C):   -8.4 % LVOT Area:     3.80 cm  2D Strain GLS (A4C):   -9.2 %                          2D Strain GLS Avg:     -9.4 %                           3D Volume EF:                          3D EF:        37 %                          LV EDV:       177 ml                          LV ESV:       111 ml                          LV SV:        66 ml RIGHT VENTRICLE          IVC RV Basal diam:  3.40 cm  IVC diam: 2.40 cm LEFT ATRIUM             Index        RIGHT ATRIUM           Index LA diam:        3.10 cm 2.00 cm/m   RA Area:     12.80 cm LA Vol (A2C):   70.0 ml 45.13 ml/m  RA Volume:   31.20 ml  20.11 ml/m LA Vol (A4C):   46.1 ml 29.69 ml/m LA Biplane Vol: 67.9 ml 43.77 ml/m  AORTIC VALVE                    PULMONIC VALVE AV Area (Vmax):    3.40 cm     PV Vmax:       0.64 m/s AV Area (Vmean):    3.22 cm     PV Peak grad:  1.6 mmHg AV Area (VTI):     3.48 cm AV Vmax:           74.40 cm/s AV Vmean:          48.800 cm/s AV VTI:  0.119 m AV Peak Grad:      2.2 mmHg AV Mean Grad:      1.0 mmHg LVOT Vmax:         66.60 cm/s LVOT Vmean:        41.300 cm/s LVOT VTI:          0.109 m LVOT/AV VTI ratio: 0.92  AORTA Ao Root diam: 3.80 cm Ao Asc diam:  3.20 cm MITRAL VALVE MV Area (PHT): 6.07 cm    SHUNTS MV Decel Time: 125 msec    Systemic VTI:  0.11 m MV E velocity: 0.36 cm/s   Systemic Diam: 2.20 cm MV A velocity: 65.10 cm/s MV E/A ratio:  0.01 Jodelle Red MD Electronically signed by Jodelle Red MD Signature Date/Time: 08/14/2022/7:46:53 PM    Final    MR BRAIN WO CONTRAST  Result Date: 08/14/2022 CLINICAL DATA:  Stroke, follow-up. EXAM: MRI HEAD WITHOUT CONTRAST TECHNIQUE: Multiplanar, multiecho pulse sequences of the brain and surrounding structures were obtained without intravenous contrast. COMPARISON:  Head CT and CTA head/neck 08/14/2022. FINDINGS: Brain: Small focus of acute cortical infarction in the left occipital lobe (image 27 series 2). Punctate acute infarct in the left corona radiata (image 30 series 2). Unchanged bilateral mixed signal, holohemispheric subdural hematomas, measuring up to 6 mm on the right and 7 mm on the left (image 23 series 4). Unchanged mild chronic small-vessel disease with old infarcts in the right corona radiata and centrum semiovale. No hydrocephalus or midline shift. Vascular: Normal flow voids. Skull and upper cervical spine: Normal marrow signal. Sinuses/Orbits: Unremarkable. Other: None. IMPRESSION: 1. Small focus of acute cortical infarction in the left occipital lobe. Punctate acute infarct in the left corona radiata. 2. Unchanged bilateral mixed signal, holohemispheric subdural hematomas, measuring up to 6 mm on the right and 7 mm on the left. Electronically Signed   By: Orvan Falconer M.D.   On: 08/14/2022 15:33   DG Abd Portable  1V  Result Date: 08/14/2022 CLINICAL DATA:  Tube placement EXAM: PORTABLE ABDOMEN - 1 VIEW COMPARISON:  X-ray 03/14/2019.  CT 06/06/2022 FINDINGS: Limited x-ray of the upper abdomen lower chest portable demonstrates enteric tube with tip overlying the midbody of the stomach. The side hole is just past the area of the GE junction. Surgical clips seen otherwise along the lower mediastinum. Minimal bowel gas elsewhere in the abdomen. Question gastric fold thickening versus underdistention. IMPRESSION: Enteric tube with tip overlying the midbody of the stomach Electronically Signed   By: Karen Kays M.D.   On: 08/14/2022 10:01   CT HEAD CODE STROKE WO CONTRAST  Addendum Date: 08/14/2022   ADDENDUM REPORT: 08/14/2022 05:51 ADDENDUM: ASPECTS 10. This was discussed by telephone with Lindzen on 08/14/2022 at 0536 hours. Electronically Signed   By: Odessa Fleming M.D.   On: 08/14/2022 05:51   Result Date: 08/14/2022 CLINICAL DATA:  Code stroke. Male of unknown age with right side weakness. EXAM: CT HEAD WITHOUT CONTRAST TECHNIQUE: Contiguous axial images were obtained from the base of the skull through the vertex without intravenous contrast. RADIATION DOSE REDUCTION: This exam was performed according to the departmental dose-optimization program which includes automated exposure control, adjustment of the mA and/or kV according to patient size and/or use of iterative reconstruction technique. COMPARISON:  None Available. FINDINGS: Brain: Bilateral subdural hematoma. Mostly hypodense left side subdural hematoma measures 4-5 mm in thickness at most levels. Smaller but more I so to hyperdense right side subdural hematoma is at most 3-4 mm, and  less than that along the anterior frontal convexity. No significant midline shift. Mild mass effect on the ventricles more apparent on the left. Dural calcification along the falx. Small if any para falcine or tentorial component of blood. Basilar cisterns remain patent including the  suprasellar cistern. No IVH or ventriculomegaly. No intra-axial or subarachnoid hemorrhage component is identified. No cortically based acute infarct identified. Chronic appearing white matter encephalomalacia in the right frontal lobe. No cortical encephalomalacia identified. Vascular: No suspicious intracranial vascular hyperdensity. Calcified atherosclerosis at the skull base. Skull: No acute osseous abnormality identified. Sinuses/Orbits: Visualized paranasal sinuses and mastoids are well aerated. Other: Slight leftward gaze. Orbit and scalp soft tissues otherwise negative. ASPECTS [facility] Stroke Program Early CT Score) Total score (0-10 with 10 being normal): Probably not applicable due to bilateral subdural hematoma. IMPRESSION: 1. Bilateral Subdural Hematoma, slightly larger (5 mm) and hypodense on the Left, more isodense and at most 3-4 mm on the Right. 2. Mild associated intracranial mass effect but no midline shift. Basilar cisterns remain patent. 3. No skull fracture or other acute intracranial abnormality identified. Small areas of chronic appearing white matter encephalomalacia in the right frontal lobe. #1 was communicated to Dr. Otelia Limes at 5:23 am on 08/14/2022 by text page via the [facility] messaging system. Electronically Signed: By: Odessa Fleming M.D. On: 08/14/2022 05:26   CT ANGIO HEAD NECK W WO CM W PERF (CODE STROKE)  Result Date: 08/14/2022 CLINICAL DATA:  67 year old male code stroke presentation, right side weakness. Bilateral subdural hematoma on plain head CT. EXAM: CT ANGIOGRAPHY HEAD AND NECK CT PERFUSION BRAIN. TECHNIQUE: Multidetector CT imaging of the head and neck was performed using the standard protocol during bolus administration of intravenous contrast. Multiplanar CT image reconstructions and MIPs were obtained to evaluate the vascular anatomy. Carotid stenosis measurements (when applicable) are obtained utilizing NASCET criteria, using the distal internal carotid diameter as the  denominator. CT Perfusion performed during bolus contrast administration, analyzed with RAPID software. RADIATION DOSE REDUCTION: This exam was performed according to the departmental dose-optimization program which includes automated exposure control, adjustment of the mA and/or kV according to patient size and/or use of iterative reconstruction technique. CONTRAST:  OMNIPAQUE IOHEXOL 350 MG/ML SOLN COMPARISON:  Plain head CT 0519 hours today. Previous CTA head and neck 03/01/2021 FINDINGS: CTA NECK Skeleton: Absent maxillary dentition. Carious mandible dentition. Previous sternotomy. Chronic cervical spine degeneration. No acute osseous abnormality identified. Upper chest: Some centrilobular emphysema.  Evidence of prior CABG. Other neck: No acute finding. Aortic arch: 3 vessel arch.  Relatively mild arch atherosclerosis. Right carotid system: No brachiocephalic artery or right CCA stenosis. Calcified plaque at the posterior right ICA origin and bulb with no significant stenosis. Left carotid system: Mild left CCA plaque, no significant stenosis. Left ICA origin and bulb soft and calcified plaque with no significant stenosis. Additional mild calcified plaque just below the skull base with no significant stenosis. Vertebral arteries: No significant proximal subclavian artery stenosis. Normal vertebral artery origins. Left vertebral artery appears mildly dominant. Both are patent to the skull base with no significant stenosis. CTA HEAD Posterior circulation: Patent distal vertebral arteries, PICA origins and vertebrobasilar junction with no significant stenosis. Patent basilar artery. SCA and PCA origins remain patent. Mild left and up to moderate right PCA P1 and P2 segment irregularity is noted and appears increased since 2022. But bilateral distal PCA branches remain patent. There is moderate right P2 segment stenosis on series 12, image 18. Anterior circulation: Both ICA siphons are  patent but heavily  calcified. Severe right cavernous segment calcified plaque and stenosis (series 8, image 85) appears stable since 2022. The right ICA terminus remains patent. Left ICA siphon bulky calcified plaque also with severe cavernous segment stenosis on series 8, image 90, which has not significantly changed. Left ICA terminus remains patent. MCA and ACA origins remain patent. Bilateral ACA branches, anterior communicating artery, and right MCA M1 appear stable since 2022. ACA branches are within normal limits. There is mild to moderate right MCA bifurcation irregularity and stenosis. Right MCA branches are stable. There is new left MCA distal M1 segment occlusion (series 10, image 23 and series 11, image 19. Compared to the 2022 CTA, there is relatively poor early MCA branch collateral enhancement. Only the dominant posterior branch is somewhat spared. Venous sinuses: Early contrast timing, grossly patent. Anatomic variants: None significant. Other findings: No contrast extravasation identified with either subdural hematoma. CT PERFUSION: ASPECTS 10 at 0519 hours today. Estimated infarct core 38 mL based on CBF less than 30%. Estimated penumbra 105 mL based on T-max greater than 6 S. Mismatch volume 67 mL, mismatch ratio Ratio 2.8. Hypoperfusion index 0.6. Left MCA territory affected. Review of the MIP images confirms the above findings Preliminary results of the above were communicated to Dr. Otelia Limes at 5:36 am on 08/14/2022 by text page via the [facility] messaging system. And also discussed by telephone at that same time. IMPRESSION: 1. Positive for Left MCA distal M1 ELVO. 2. CT Perfusion estimates 38 mL left MCA infarct core. ASPECTS 10 at 0519 hours today. 105 mL volume of left MCA oligemia/penumbra. Hypoperfusion index 0.6. 3. The above Study discussed by telephone with Dr. Otelia Limes on 08/14/2022 at 0536 hours. 4. Underlying advanced intracranial atherosclerosis including chronic severe bilateral ICA siphon stenosis due to  bulky cavernous segment plaque. Right MCA distal M1/bifurcation mild-to-moderate stenosis appears progressed since the 2022 CTA. Similar Right PCA P2 segment moderate stenosis also appears progressed. 5. Comparatively mild extracranial atherosclerosis. No significant stenosis in the neck. 6. Plain Head CT, bilateral subdural hematoma detailed separately today. 7. Emphysema (ICD10-J43.9). Aortic Atherosclerosis (ICD10-I70.0). Prior CABG. Electronically Signed   By: Odessa Fleming M.D.   On: 08/14/2022 05:50    Assessment/Plan: Status post complete revascularization of occluded left middle cerebral artery M1 segment with 1 pass with a 3 mm x 40 mm Solitaire X retrieval device, and contact  aspiration achieving a TICI 3 revascularization. Awake but agitated, unwilling to cooperate with neuro exam. Possibly some withdrawal given substance abuse hx Groin soft, no hematoma   LOS: 1 day   I spent a total of 15 minutes in face to face in clinical consultation, greater than 50% of which was counseling/coordinating care for [facility] CVA revasc intervention  Brayton El PA-C 08/15/2022 8:51 AM

## 2022-08-15 NOTE — Progress Notes (Signed)
Patient affect varies between somnolent and agitated throughout shift. Patient is uncooperative with nursing care. Patient states "you need to get out of here" and "leave me alone". Patient would not allow assessment of femoral groin site at 0400.

## 2022-08-15 NOTE — Progress Notes (Signed)
STROKE TEAM PROGRESS NOTE   INTERVAL HISTORY No family is at the bedside.   Patient self-extubated himself last night.  He has been intermittently agitated and not cooperative and threatening to the nurses. MRI scan of the brain on the [facility] shows tiny left occipital cortical and left coronary radiator punctate infarcts.  Unchanged appearance of mixed signal small bilateral subdural hematomas Vitals:   08/15/22 0730 08/15/22 0800 08/15/22 0830 08/15/22 1200  BP: 117/77 115/69 120/75   Pulse: 60 (!) 57 61   Resp: 17 (!) 6 16   Temp:  98.3 F (36.8 C)  97.7 F (36.5 C)  TempSrc:  Axillary  Axillary  SpO2: 100% 98% 96%   Weight:      Height:       CBC:  Recent Labs  Lab 08/14/22 0515 08/14/22 0517  WBC 3.7*  --   NEUTROABS 1.8  --   HGB 13.3 13.6  HCT 40.0 40.0  MCV 99.3  --   PLT 207  --    Basic Metabolic Panel:  Recent Labs  Lab 08/14/22 0515 08/14/22 0517  NA 134* 139  K 3.7 3.8  CL 105 106  CO2 22  --   GLUCOSE 100* 99  BUN 12 13  CREATININE 0.99 0.90  CALCIUM 8.3*  --    Lipid Panel: No results for input(s): "CHOL", "TRIG", "HDL", "CHOLHDL", "VLDL", "LDLCALC" in the last 168 hours. HgbA1c:  Recent Labs  Lab 08/14/22 1205  HGBA1C 5.6   Urine Drug Screen:  Recent Labs  Lab 08/14/22 1130  LABOPIA NONE DETECTED  COCAINSCRNUR POSITIVE*  LABBENZ NONE DETECTED  AMPHETMU NONE DETECTED  THCU NONE DETECTED  LABBARB NONE DETECTED    Alcohol Level  Recent Labs  Lab 08/14/22 0515  ETH <10    IMAGING past 24 hours ECHOCARDIOGRAM COMPLETE  Result Date: 08/14/2022    ECHOCARDIOGRAM REPORT   Patient Name:   KIEV SONIER Date of Exam: 08/14/2022 Medical Rec #:  [id number]       Height:       66.0 in Accession #:    [id number]      Weight:       110.0 lb Date of Birth:  05/13/1955       BSA:          1.551 m Patient Age:    66 years        BP:           141/91 mmHg Patient Gender: M               HR:           70 bpm. Exam Location:  Inpatient Procedure: 2D  Echo, 3D Echo, Cardiac Doppler, Color Doppler and Strain Analysis Indications:    Stroke I63.9  History:        Patient has prior history of Echocardiogram examinations, most                 recent 03/02/2021. CAD, Prior CABG; Stroke. 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  Sonographer:    Dondra Prader RVT RCS Referring Phys: ERIC LINDZEN IMPRESSIONS  1. Global severe hypokinesis with akinesis of apex. Left ventricular ejection fraction, by estimation,  is 20 to 25%. The left ventricle has severely decreased function. The left ventricle demonstrates global hypokinesis. The left ventricular internal cavity size was mildly dilated. Left ventricular diastolic parameters are indeterminate. The average left ventricular global longitudinal strain is -9.4 %. The global longitudinal strain is abnormal.  2. Right ventricular systolic function is normal. The right ventricular size is normal.  3. Left atrial size was moderately dilated.  4. Right atrial size was mildly dilated.  5. The mitral valve is grossly normal. Trivial mitral valve regurgitation. No evidence of mitral stenosis.  6. The aortic valve is grossly normal. Aortic valve regurgitation is not visualized. No aortic stenosis is present.  7. The inferior vena cava is dilated in size with <50% respiratory variability, suggesting right atrial pressure of 15 mmHg. Comparison(s): Changes from prior study are noted. LV EF decreased compared to prior. Conclusion(s)/Recommendation(s): LVEF severely reduced, worse compared to prior. While there is no LV apical thrombus seen on imaging, would consider limited echo with echo contrast to definitively exclude LV apical thrombus. Will communicate recommendations to primary team. FINDINGS  Left Ventricle: Global severe hypokinesis with akinesis of apex. Left ventricular ejection fraction, by estimation, is 20 to  25%. The left ventricle has severely decreased function. The left ventricle demonstrates global hypokinesis. The average left ventricular global longitudinal strain is -9.4 %. The global longitudinal strain is abnormal. The left ventricular internal cavity size was mildly dilated. There is no left ventricular hypertrophy. Left ventricular diastolic parameters are indeterminate. Right Ventricle: The right ventricular size is normal. Right vetricular wall thickness was not well visualized. Right ventricular systolic function is normal. Left Atrium: Left atrial size was moderately dilated. Right Atrium: Right atrial size was mildly dilated. Pericardium: There is no evidence of pericardial effusion. Mitral Valve: The mitral valve is grossly normal. Trivial mitral valve regurgitation. No evidence of mitral valve stenosis. Tricuspid Valve: The tricuspid valve is not well visualized. Tricuspid valve regurgitation is trivial. No evidence of tricuspid stenosis. Aortic Valve: The aortic valve is grossly normal. Aortic valve regurgitation is not visualized. No aortic stenosis is present. Aortic valve mean gradient measures 1.0 mmHg. Aortic valve peak gradient measures 2.2 mmHg. Aortic valve area, by VTI measures 3.48 cm. Pulmonic Valve: The pulmonic valve was not well visualized. Pulmonic valve regurgitation is not visualized. No evidence of pulmonic stenosis. Aorta: The aortic root, ascending aorta, aortic arch and descending aorta are all structurally normal, with no evidence of dilitation or obstruction. Venous: The inferior vena cava is dilated in size with less than 50% respiratory variability, suggesting right atrial pressure of 15 mmHg. IAS/Shunts: The interatrial septum was not well visualized.  LEFT VENTRICLE PLAX 2D LVIDd:         5.50 cm   Diastology LVIDs:         4.70 cm   LV e' medial:   0.05 cm/s LV PW:         0.80 cm   LV E/e' medial: 7.8 LV IVS:        0.70 cm LVOT diam:     2.20 cm   2D Longitudinal Strain  LV SV:         41        2D Strain GLS (A2C):   -10.6 % LV SV Index:   27        2D Strain GLS (A3C):   -8.4 % LVOT Area:     3.80 cm  2D Strain GLS (A4C):   -9.2 %  2D Strain GLS Avg:     -9.4 %                           3D Volume EF:                          3D EF:        37 %                          LV EDV:       177 ml                          LV ESV:       111 ml                          LV SV:        66 ml RIGHT VENTRICLE          IVC RV Basal diam:  3.40 cm  IVC diam: 2.40 cm LEFT ATRIUM             Index        RIGHT ATRIUM           Index LA diam:        3.10 cm 2.00 cm/m   RA Area:     12.80 cm LA Vol (A2C):   70.0 ml 45.13 ml/m  RA Volume:   31.20 ml  20.11 ml/m LA Vol (A4C):   46.1 ml 29.69 ml/m LA Biplane Vol: 67.9 ml 43.77 ml/m  AORTIC VALVE                    PULMONIC VALVE AV Area (Vmax):    3.40 cm     PV Vmax:       0.64 m/s AV Area (Vmean):   3.22 cm     PV Peak grad:  1.6 mmHg AV Area (VTI):     3.48 cm AV Vmax:           74.40 cm/s AV Vmean:          48.800 cm/s AV VTI:            0.119 m AV Peak Grad:      2.2 mmHg AV Mean Grad:      1.0 mmHg LVOT Vmax:         66.60 cm/s LVOT Vmean:        41.300 cm/s LVOT VTI:          0.109 m LVOT/AV VTI ratio: 0.92  AORTA Ao Root diam: 3.80 cm Ao Asc diam:  3.20 cm MITRAL VALVE MV Area (PHT): 6.07 cm    SHUNTS MV Decel Time: 125 msec    Systemic VTI:  0.11 m MV E velocity: 0.36 cm/s   Systemic Diam: 2.20 cm MV A velocity: 65.10 cm/s MV E/A ratio:  0.01 Jodelle Red MD Electronically signed by Jodelle Red MD Signature Date/Time: 08/14/2022/7:46:53 PM    Final    MR BRAIN WO CONTRAST  Result Date: 08/14/2022 CLINICAL DATA:  Stroke, follow-up. EXAM: MRI HEAD WITHOUT CONTRAST TECHNIQUE: Multiplanar, multiecho pulse sequences of the brain and surrounding structures were obtained without intravenous contrast. COMPARISON:  Head CT and CTA head/neck 08/14/2022. FINDINGS: Brain: Small focus of acute cortical  infarction  in the left occipital lobe (image 27 series 2). Punctate acute infarct in the left corona radiata (image 30 series 2). Unchanged bilateral mixed signal, holohemispheric subdural hematomas, measuring up to 6 mm on the right and 7 mm on the left (image 23 series 4). Unchanged mild chronic small-vessel disease with old infarcts in the right corona radiata and centrum semiovale. No hydrocephalus or midline shift. Vascular: Normal flow voids. Skull and upper cervical spine: Normal marrow signal. Sinuses/Orbits: Unremarkable. Other: None. IMPRESSION: 1. Small focus of acute cortical infarction in the left occipital lobe. Punctate acute infarct in the left corona radiata. 2. Unchanged bilateral mixed signal, holohemispheric subdural hematomas, measuring up to 6 mm on the right and 7 mm on the left. Electronically Signed   By: Orvan Falconer M.D.   On: 08/14/2022 15:33    PHYSICAL EXAM Physical Exam  Frail cachectic looking middle-aged African-American male HEENT-  [place]/AT    Lungs-patient not cooperative for exam extremities- No edema   Neurological Examination Mental Status: Drowsy but can be aroused but is not cooperative for detailed exam speech is clear but will not speak a lot and can follow only simple midline and one-step commands Cranial Nerves: Blinks to threat on the left but not on the right. PERRL 3 mm >> 2 mm. Does not track.  Reacts to eyelid stimulation on the left but not on the right.   Prominent right facial droop, lower quadrant  Motor/Sensory: RUE with increased tone; drops immediately to bed when elevated and released. Wd to pain. RLE drops to bed with slight effort against gravity after passive elevation and release.  LUE 5/5, purposeful LLE with brisk withdrawal to pain.  Cerebellar: Unable to assess  Gait: Unable to assess    ASSESSMENT/PLAN YOUNG GAYNOR is an 67 y.o. male with a PMHx of stroke with LUE weakness, non-Hodgkin's lymphoma, s/p portacath placement  followed by removal on 07/26/22, CAD s/p CABG x 4 in 2010, CHF and substance abuse who presents to the ED via EMS as a Code Stroke. LKN was 0430 when he was interacting with associates and abruptly became aphasic with right sided weakness per bystanders who called EMS. On EMS arrival they noted same. CBG 139, BP 132/77.   Acute cortical infarction left occipital lobe Punctate acute infarct left corona radiata Bilateral subdural hematomas Etiology: Left MCA distal M1 LVO s/p TICI 3 revascularization Underlying advanced intracranial atherosclerosis including chronic severe bilateral ICA stenosis.  Hx of CVA with residual LUE weakness  Code Stroke CT head  Bilateral subdural hematomas Small areas of chronic appearing white matter encephalomalacia in right frontal lobe CTA head & neck  Left MCA distal M1 ELVO CT perfusion  38 mm left MCA infarct core.  105 mL volume of left MCA penumbra Hypoperfusion index 0.6 Cerebral angio  Occluded left middle cerebral artery distal M1 segment.  TICI 3 revascularization. Post IR CT: No ICH.  MRI   Small focus of acute cortical infarcts in the left occipital lobe Punctate acute infarct in the left corona radiata Unchanged subdural hematomas, measuring up to 6 mm on the right 7 mm on the left.  2D Echo: pending   LDL No results found for requested labs within last 1095 days. HgbA1c 5.6 VTE prophylaxis - SCDs    Diet   Diet regular Room service appropriate? Yes; Fluid consistency: Thin   No antithrombotic prior to admission, now on Aspirin alone. Pending swallow screen post-self extubation. If patient does not pass, can give asa  rectal x1.  Therapy recommendations:  pending Disposition:  pending  Acute procedure respiratory insufficiency requiring mechanical ventilation Appreciate CCM assistance with vent management Self-extubated around 1700, stable.   Hypertension Home meds:  none Cleviprex gtt, wean as able.  SBP goal 120-160 post-IR  intervention Long-term BP goal normotensive  Hyperlipidemia Home meds:  none LDL No results found for requested labs within last 1095 days., goal < 70 Pending LDL for risk factor modification   Diabetes type II, no history Home meds:  none HgbA1c 5.6, goal < 7.0 CBGs Recent Labs    08/14/22 0510  GLUCAP 98    SSI  Other Stroke Risk Factors Advanced Age >/= 68  Substance abuse - UDS:  Cocaine POSITIVE.  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  TNKase not administered due to presence of bilateral chronic subdural hematomas.  Patient is doing well but being uncooperative and intermittently agitated.  Recommend swallow eval by speech therapy.  Start aspirin alone for now and may consider dual antiplatelet therapy given significant intracranial atherosclerosis which may contributed to stroke.   Mobilize out of bed.  Therapy consults.  Aggressive risk factor modification.  Patient will need counseling to quit cocaine.  No family available at the bedside for discussion.    Transfer out of ICU to  stepdown bed.  Greater than 50% time during this 50-minute visit was spent in counseling and coordination of care about his stroke and MCA occlusion discussion for stroke prevention and answering questions.    Delia Heady, MD Medical Director [facility] Pager: [phone] 08/15/2022 2:54 PM   To contact Stroke Continuity provider, please refer to [email or web address]. After hours, contact General Neurology

## 2022-08-15 NOTE — Evaluation (Signed)
Physical Therapy Evaluation Patient Details Name: [name] MRN: [id number] DOB: 08-Dec-1955 Today's Date: 08/15/2022  History of Present Illness  Pt is a 67 y/o male presenting on 5/19 with acute onset of R hemiplegia and aphasia. S/P TICI 3 revascularization of L MCA M1. MRI with punctate acute infarct of L corona radiata, acute cortical infarction in L occipital lobe, and unchanged bilateral subdural hematomas. Extubated 5/20. 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  Clinical Impression  Patient presents with generalized weakness but difficult to fully assess due to patient participation.  Noted to have movement in all 4 extremities and balancing himself propped on L elbow after assisted with +2 to EOB.  He would not give history so unsure of prior level of function.  Patient may need inpatient rehab prior to d/c depending on progress.  PT will follow up.      Recommendations for follow up therapy are one component of a multi-disciplinary discharge planning process, led by the attending physician.  Recommendations may be updated based on patient status, additional functional criteria and insurance authorization.  Follow Up Recommendations Can patient physically be transported by private vehicle: No     Assistance Recommended at Discharge Intermittent Supervision/Assistance  Patient can return home with the following  Two people to help with walking and/or transfers;Two people to help with bathing/dressing/bathroom    Equipment Recommendations Other (comment) (TBA)  Recommendations for Other Services       Functional Status Assessment Patient has had a recent decline in their functional status and demonstrates the ability to make significant improvements in function in a reasonable and predictable amount of time.     Precautions / Restrictions Precautions Precautions: Fall Restrictions Weight Bearing  Restrictions: No      Mobility  Bed Mobility Overal bed mobility: Needs Assistance Bed Mobility: Sidelying to Sit, Sit to Sidelying   Sidelying to sit: Max assist     Sit to sidelying: Mod assist General bed mobility comments: initation required to bring legs off EOB and elevate trunk, pt returned to sidelying with assist for LB only    Transfers                        Ambulation/Gait                  Stairs            Wheelchair Mobility    Modified Rankin (Stroke Patients Only) Modified Rankin (Stroke Patients Only) Pre-Morbid Rankin Score: Moderate disability (unknown) Modified Rankin: Severe disability     Balance Overall balance assessment: Needs assistance   Sitting balance-Leahy Scale: Fair Sitting balance - Comments: pt propped self on L forearm, min guard for safety       Standing balance comment: NT                             Pertinent Vitals/Pain Pain Assessment Pain Assessment: Faces Faces Pain Scale: No hurt    Home Living Family/patient expects to be discharged to:: Private residence Living Arrangements: Alone                 Additional Comments: unsure, pt with limited engagement    Prior Function Prior Level of Function : Independent/Modified Independent  Hand Dominance   Dominant Hand: Right    Extremity/Trunk Assessment   Upper Extremity Assessment Upper Extremity Assessment: Defer to OT evaluation    Lower Extremity Assessment Lower Extremity Assessment: Difficult to assess due to impaired cognition (had to lift legs onto bed from partial sitting EOB)    Cervical / Trunk Assessment Cervical / Trunk Assessment: Kyphotic  Communication   Communication: No difficulties  Cognition Arousal/Alertness: Awake/alert Behavior During Therapy: Agitated, Flat affect Overall Cognitive Status: Difficult to assess                                  General Comments: pt able to follow some simple commands, mostly limited by agitation and unwillingness to participate. Perseverates on wanting to have food, but not problem solving through needing to try water before getting a meal        General Comments General comments (skin integrity, edema, etc.): patient refusing to participate in EOB or OOB; declines to drink water to assess swallow though he really wants to eat    Exercises     Assessment/Plan    PT Assessment Patient needs continued PT services  PT Problem List Decreased strength;Decreased balance;Decreased cognition;Decreased mobility;Decreased activity tolerance;Decreased safety awareness       PT Treatment Interventions DME instruction;Functional mobility training;Balance training;Patient/family education;Therapeutic activities;Gait training;Therapeutic exercise;Cognitive remediation    PT Goals (Current goals can be found in the Care Plan section)  Acute Rehab PT Goals Patient Stated Goal: to eat PT Goal Formulation: Patient unable to participate in goal setting Time For Goal Achievement: 08/29/22 Potential to Achieve Goals: Fair    Frequency Min 3X/week     Co-evaluation PT/OT/SLP Co-Evaluation/Treatment: Yes Reason for Co-Treatment: For patient/therapist safety;To address functional/ADL transfers;Necessary to address cognition/behavior during functional activity PT goals addressed during session: Mobility/safety with mobility OT goals addressed during session: ADL's and self-care       AM-PAC PT "6 Clicks" Mobility  Outcome Measure Help needed turning from your back to your side while in a flat bed without using bedrails?: A Lot Help needed moving from lying on your back to sitting on the side of a flat bed without using bedrails?: Total Help needed moving to and from a bed to a chair (including a wheelchair)?: Total Help needed standing up from a chair using your arms (e.g., wheelchair or bedside chair)?:  Total Help needed to walk in hospital room?: Total Help needed climbing 3-5 steps with a railing? : Total 6 Click Score: 7    End of Session   Activity Tolerance: Treatment limited secondary to agitation Patient left: in bed;with call bell/phone within reach;with bed alarm set   PT Visit Diagnosis: Other abnormalities of gait and mobility (R26.89);Other symptoms and signs involving the nervous system (R29.898)    Time: 1610-9604 PT Time Calculation (min) (ACUTE ONLY): 18 min   Charges:   PT Evaluation $PT Eval Moderate Complexity: 1 Mod          Cyndi Carleta Woodrow, PT Acute Rehabilitation Services Office:[phone] 08/15/2022   Elray Mcgregor 08/15/2022, 1:19 PM

## 2022-08-15 NOTE — Evaluation (Signed)
Clinical/Bedside Swallow Evaluation Patient Details  Name: [name] MRN: [id number] Date of Birth: 03-15-1956  Today's Date: 08/15/2022 Time: SLP Start Time (ACUTE ONLY): 1206 SLP Stop Time (ACUTE ONLY): 1214 SLP Time Calculation (min) (ACUTE ONLY): 8 min  Past Medical History: History reviewed. No pertinent past medical history. Past Surgical History:  Past Surgical History:  Procedure Laterality Date   RADIOLOGY WITH ANESTHESIA N/A 08/14/2022   Procedure: IR WITH ANESTHESIA;  Surgeon: Julieanne Cotton, MD;  Location: [facility] OR;  Service: Radiology;  Laterality: N/A;   HPI:  [name] is a 67 year old male who presented to ED on 5/20 via EMS with sudden onset right-sided weakness, right gaze, and aphasic. CTA positive for left MCA and distal M1 LVO.  No s/o revascularization in IR.  Remained intubated after procedue with ETT for <24 hours.  Pt with medical history significant for prior stroke with LUE weakness, non-Hodgkin's lymphoma, CAD s/p CABG x 4 in 2010, CHF, and substance abuse.    Assessment / Plan / Recommendation  Clinical Impression  Pt presents with grossly normal swallowing.  Pt was initially resistant to participate in assessment and did not follow directions for OME, but was motivated by PO trials.  There was a single cough noted during evaluation with solid textures, which was suspected to be unrelated to PO intake, as pt continued to consume additional trials with no further s/s of aspiration. Pt tolerated thin liquid, puree, and additional trials of regular solid with no clincal s/s of aspiration.  Pt exhibited adequate oral clearance of solids.    Recommend regular texture diet with thin liquids.  SLP Visit Diagnosis: Dysphagia, unspecified (R13.10)    Aspiration Risk  No limitations    Diet Recommendation Regular;Thin liquid   Liquid Administration via: Cup;Straw Medication Administration: Whole meds with liquid Supervision: Patient able to self  feed Compensations: Small sips/bites;Slow rate Postural Changes: Seated upright at 90 degrees    Other  Recommendations Oral Care Recommendations: Oral care BID    Recommendations for follow up therapy are one component of a multi-disciplinary discharge planning process, led by the attending physician.  Recommendations may be updated based on patient status, additional functional criteria and insurance authorization.  Follow up Recommendations No SLP follow up      Assistance Recommended at Discharge    Functional Status Assessment Patient has not had a recent decline in their functional status  Frequency and Duration  (N/A)          Prognosis Prognosis for improved oropharyngeal function:  (N/A)      Swallow Study   General Date of Onset: 08/14/22 HPI: [name] is a 67 year old male who presented to ED on 5/20 via EMS with sudden onset right-sided weakness, right gaze, and aphasic. CTA positive for left MCA and distal M1 LVO.  No s/o revascularization in IR.  Remained intubated after procedue with ETT for <24 hours.  Pt with medical history significant for prior stroke with LUE weakness, non-Hodgkin's lymphoma, CAD s/p CABG x 4 in 2010, CHF, and substance abuse. Type of Study: Bedside Swallow Evaluation Previous Swallow Assessment: none Diet Prior to this Study: NPO Temperature Spikes Noted: No Respiratory Status: Room air History of Recent Intubation: Yes Total duration of intubation (days): 1 days Date extubated: 08/14/22 Behavior/Cognition: Alert;Cooperative Oral Cavity Assessment: Within Functional Limits Oral Care Completed by SLP: No Oral Cavity - Dentition: Missing dentition;Poor condition Vision: Functional for self-feeding Self-Feeding Abilities: Able to feed self Patient Positioning: Upright in bed  Baseline Vocal Quality: Normal Volitional Cough: Cognitively unable to elicit Volitional Swallow: Unable to elicit    Oral/Motor/Sensory Function Overall Oral  Motor/Sensory Function: Mild impairment Facial Symmetry: Abnormal symmetry right   Ice Chips Ice chips: Not tested   Thin Liquid Thin Liquid: Within functional limits Presentation: Straw    Nectar Thick Nectar Thick Liquid: Not tested   Honey Thick Honey Thick Liquid: Not tested   Puree Puree: Within functional limits   Solid     Solid: Impaired Pharyngeal Phase Impairments: Cough - Delayed      Kerrie Pleasure, MA, CCC-SLP Acute Rehabilitation Services Office: [phone] 08/15/2022,12:56 PM

## 2022-08-16 DIAGNOSIS — I519 Heart disease, unspecified: Secondary | ICD-10-CM

## 2022-08-16 DIAGNOSIS — I255 Ischemic cardiomyopathy: Secondary | ICD-10-CM

## 2022-08-16 DIAGNOSIS — I6300 Cerebral infarction due to thrombosis of unspecified precerebral artery: Secondary | ICD-10-CM

## 2022-08-16 NOTE — Progress Notes (Signed)
Physical Therapy Treatment Patient Details Name: [name] MRN: [id number] DOB: 09-19-1955 Today's Date: 08/16/2022   History of Present Illness Pt is a 67 y/o male presenting on 5/19 with acute onset of R hemiplegia and aphasia. S/P TICI 3 revascularization of L MCA M1. MRI with punctate acute infarct of L corona radiata, acute cortical infarction in L occipital lobe, and unchanged bilateral subdural hematomas. Extubated 5/20. 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    PT Comments    Pt cooperative and participatory throughout treatment; follows commands, but demonstrates deficits in problem solving, memory, and safety awareness. Pt with preference for no assistive device. Pt ambulating 200 ft without external support at a min guard assist level. Demonstrates dynamic balance deficits, gait abnormalities, impaired cognition and decreased gait speed. Pt unable to clarify if he was close to his functional baseline. In light of pt progress and pt declining post acute rehab, updated recommendation to HHPT at d/c.    Recommendations for follow up therapy are one component of a multi-disciplinary discharge planning process, led by the attending physician.  Recommendations may be updated based on patient status, additional functional criteria and insurance authorization.  Follow Up Recommendations  Can patient physically be transported by private vehicle: Yes    Assistance Recommended at Discharge Intermittent Supervision/Assistance  Patient can return home with the following Assistance with cooking/housework;Direct supervision/assist for medications management;Direct supervision/assist for financial management;Assist for transportation;Help with stairs or ramp for entrance   Equipment Recommendations  None recommended by PT    Recommendations for Other Services       Precautions / Restrictions  Precautions Precautions: Fall Restrictions Weight Bearing Restrictions: No     Mobility  Bed Mobility Overal bed mobility: Modified Independent                  Transfers Overall transfer level: Needs assistance Equipment used: None Transfers: Sit to/from Stand Sit to Stand: Supervision                Ambulation/Gait Ambulation/Gait assistance: Min guard Gait Distance (Feet): 200 Feet Assistive device: None Gait Pattern/deviations: Step-through pattern, Decreased stride length, Decreased dorsiflexion - right, Knee flexed in stance - left, Decreased step length - right Gait velocity: decreased Gait velocity interpretation: <1.8 ft/sec, indicate of risk for recurrent falls   General Gait Details: Slow, mildly unsteady gait with slight drift to R. Decreased R step length and bilateral foot clearance   Stairs             Wheelchair Mobility    Modified Rankin (Stroke Patients Only) Modified Rankin (Stroke Patients Only) Pre-Morbid Rankin Score: No significant disability Modified Rankin: Moderately severe disability     Balance Overall balance assessment: Needs assistance Sitting-balance support: Feet supported Sitting balance-Leahy Scale: Good     Standing balance support: No upper extremity supported, During functional activity Standing balance-Leahy Scale: Good (statically)                              Cognition Arousal/Alertness: Awake/alert Behavior During Therapy: WFL for tasks assessed/performed Overall Cognitive Status: Impaired/Different from baseline Area of Impairment: Memory, Safety/judgement, Problem solving                     Memory: Decreased short-term memory   Safety/Judgement: Decreased awareness of safety, Decreased awareness of deficits   Problem Solving: Requires  verbal cues General Comments: pt calm and cooperative throughout treatment and following commands. When looking out the window, pt stating,  "oh I'm at [facility] Unclear baseline and caregiver support. Pt reports he is at a "rooming house," and girlfriend comes over frequently        Exercises      General Comments        Pertinent Vitals/Pain Pain Assessment Pain Assessment: No/denies pain    Home Living                          Prior Function            PT Goals (current goals can now be found in the care plan section) Acute Rehab PT Goals Patient Stated Goal: to go home Potential to Achieve Goals: Good Progress towards PT goals: Progressing toward goals    Frequency    Min 4X/week      PT Plan Discharge plan needs to be updated;Frequency needs to be updated    Co-evaluation              AM-PAC PT "6 Clicks" Mobility   Outcome Measure  Help needed turning from your back to your side while in a flat bed without using bedrails?: None Help needed moving from lying on your back to sitting on the side of a flat bed without using bedrails?: None Help needed moving to and from a bed to a chair (including a wheelchair)?: A Little Help needed standing up from a chair using your arms (e.g., wheelchair or bedside chair)?: A Little Help needed to walk in hospital room?: A Little Help needed climbing 3-5 steps with a railing? : A Little 6 Click Score: 20    End of Session Equipment Utilized During Treatment: Gait belt Activity Tolerance: Patient tolerated treatment well Patient left: in chair;with call bell/phone within reach;with chair alarm set Nurse Communication: Mobility status PT Visit Diagnosis: Other abnormalities of gait and mobility (R26.89);Other symptoms and signs involving the nervous system (R29.898)     Time: 8119-1478 PT Time Calculation (min) (ACUTE ONLY): 24 min  Charges:  $Therapeutic Activity: 23-37 mins                     Lillia Pauls, PT, DPT Acute Rehabilitation Services Office [phone]    Norval Morton 08/16/2022, 3:24 PM

## 2022-08-16 NOTE — Consult Note (Addendum)
Cardiology Consultation   Patient ID: [name] MRN: [id number]; DOB: 1955-06-11  Admit date: 08/14/2022 Date of Consult: 08/16/2022  PCP:  Oneita Hurt, No   [facility] Providers Cardiologist:  None     Patient Profile:   [name] is a 67 y.o. male with a hx of CAD s/p 4v CABG, CVA, substance abuse, non-hodgkin's lymphoma, HFrEF (40-45%) who is being seen 08/16/2022 for the evaluation of CHF at the request of Dr. Otelia Limes.  History of Present Illness:   [name] is a 67 yo male with PMH noted above. He has been followed by Dr. Anne Fu as an outpatient. Previously was lost to follow-up. He was seen Georgie Chard, NP on 09/05/2019. 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 Bulky right hilar and central perihilar mass. 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 Had been followed by Dr. Arbutus Ped. Last seen by Dr. Anne Fu in 03/2020 and [place] in [place] at that time. Since then he has been lost to follow up.   Did have an echo 02/2021 while inpatient that showed LVEF of 40-45%, akinesis of the entire apex, similar to prior echo from 2021.  He presented to the ED on 5/20 with complaints of right-sided weakness, right gaze and aphasia with his last known normal around 4:30 AM that day.  Per notes it appears he was found at a residential building where drug exchange was taking place.  On arrival a code stroke was called.  CT head showed bilateral subdural hematomas, no fracture.  CT a of head and neck was positive for left MCA and distal M1.  He was evaluated by neurology and neuro interventional radiologist with decision made to proceed with thrombectomy with two-physician consent as the patient was unable to make medical decisions and there was no family present.  PCCM was consulted as the patient was intubated prior to IR procedure.  Labs on admission showed sodium 134, potassium 3.7, creatinine 0.9, WBC 3.7, hemoglobin 13.3.   UDS positive for cocaine.  EKG showed sinus rhythm, 86 bpm, PVCs with probable anterior lateral infarct.   Echocardiogram 5/20 with LVEF of 20 to 25%, severe global hypokinesis, abnormal global longitudinal strain, normal RV size and function, moderately dilated LA, mildly dilated RA, trivial MR.   He self extubated on 5/21.  He has been started on aspirin and Plavix per neurology recommendations for at least 3 months then continue aspirin as monotherapy.  Cardiology now asked to evaluate regarding cardiomyopathy.   In talking with patient, he has a home and family/friends that help him with his care. States he was taking no medications prior to admission. Reports illicit drug use with cocaine. Denies any anginal episodes prior to admission.     History reviewed. No pertinent past medical history.  Past Surgical History:  Procedure Laterality Date   IR ANGIO INTRA EXTRACRAN SEL COM CAROTID INNOMINATE UNI R MOD SED  08/14/2022   IR ANGIO VERTEBRAL SEL SUBCLAVIAN INNOMINATE UNI R MOD SED  08/14/2022   IR CT HEAD LTD  08/14/2022   IR PERCUTANEOUS ART THROMBECTOMY/INFUSION INTRACRANIAL INC DIAG ANGIO  08/14/2022   RADIOLOGY WITH ANESTHESIA N/A 08/14/2022   Procedure: IR WITH ANESTHESIA;  Surgeon: Julieanne Cotton, MD;  Location: [facility] OR;  Service: Radiology;  Laterality: N/A;     Home Medications:  Prior to Admission medications   Not on File    Inpatient Medications: Scheduled Meds:  aspirin  300 mg Rectal  Cardiology Consultation   Patient ID: [name] MRN: [id number]; DOB: 1955-06-11  Admit date: 08/14/2022 Date of Consult: 08/16/2022  PCP:  Oneita Hurt, No   [facility] Providers Cardiologist:  None     Patient Profile:   [name] is a 67 y.o. male with a hx of CAD s/p 4v CABG, CVA, substance abuse, non-hodgkin's lymphoma, HFrEF (40-45%) who is being seen 08/16/2022 for the evaluation of CHF at the request of Dr. Otelia Limes.  History of Present Illness:   [name] is a 67 yo male with PMH noted above. He has been followed by Dr. Anne Fu as an outpatient. Previously was lost to follow-up. He was seen Georgie Chard, NP on 09/05/2019. 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 Bulky right hilar and central perihilar mass. 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 Had been followed by Dr. Arbutus Ped. Last seen by Dr. Anne Fu in 03/2020 and [place] living in [place] at that time. Since then he has been lost to follow up.   Did have an echo 02/2021 while inpatient that showed LVEF of 40-45%, akinesis of the entire apex, similar to prior echo from 2021.  He presented to the ED on 5/20 with complaints of right-sided weakness, right gaze and aphasia with his last known normal around 4:30 AM that day.  Per notes it appears he was found at a residential building where drug exchange was taking place.  On arrival a code stroke was called.  CT head showed bilateral subdural hematomas, no fracture.  CT a of head and neck was positive for left MCA and distal M1.  He was evaluated by neurology and neuro interventional radiologist with decision made to proceed with thrombectomy with two-physician consent as the patient was unable to make medical decisions and there was no family present.  PCCM was consulted as the patient was intubated prior to IR procedure.  Labs on admission showed sodium 134, potassium 3.7, creatinine 0.9, WBC 3.7, hemoglobin 13.3.   UDS positive for cocaine.  EKG showed sinus rhythm, 86 bpm, PVCs with probable anterior lateral infarct.   Echocardiogram 5/20 with LVEF of 20 to 25%, severe global hypokinesis, abnormal global longitudinal strain, normal RV size and function, moderately dilated LA, mildly dilated RA, trivial MR.   He self extubated on 5/21.  He has been started on aspirin and Plavix per neurology recommendations for at least 3 months then continue aspirin as monotherapy.  Cardiology now asked to evaluate regarding cardiomyopathy.   In talking with patient, he has a home and family/friends that help him with his care. States he was taking no medications prior to admission. Reports illicit drug use with cocaine. Denies any anginal episodes prior to admission.     History reviewed. No pertinent past medical history.  Past Surgical History:  Procedure Laterality Date   IR ANGIO INTRA EXTRACRAN SEL COM CAROTID INNOMINATE UNI R MOD SED  08/14/2022   IR ANGIO VERTEBRAL SEL SUBCLAVIAN INNOMINATE UNI R MOD SED  08/14/2022   IR CT HEAD LTD  08/14/2022   IR PERCUTANEOUS ART THROMBECTOMY/INFUSION INTRACRANIAL INC DIAG ANGIO  08/14/2022   RADIOLOGY WITH ANESTHESIA N/A 08/14/2022   Procedure: IR WITH ANESTHESIA;  Surgeon: Julieanne Cotton, MD;  Location: [facility] OR;  Service: Radiology;  Laterality: N/A;     Home Medications:  Prior to Admission medications   Not on File    Inpatient Medications: Scheduled Meds:  aspirin  300 mg Rectal  of the MCA distribution was obtained. A control arteriogram was then performed at 5 and 10  minutes post aspiration. These continued to demonstrate complete revascularization of the left MCA distribution. A final control arteriogram performed through the balloon guide catheter in the left common carotid artery demonstrated patency of the left internal carotid artery extra cranially and intracranially with maintenance of a TICI 3 revascularization of the left middle cerebral artery distribution. Selective diagnostic arteriograms of the right common carotid artery and of the right vertebral artery were also performed. The right common carotid arteriogram demonstrates patency of the right external carotid artery and its branches. The right internal carotid artery at the bulb demonstrates a small smooth focal outpouching at the bulb. The internal carotid artery distally demonstrates wide patency in the petrous, the cavernous and the supraclinoid ICA. The right middle cerebral artery and the right anterior cerebral artery opacify into the capillary and venous phases. The right vertebral artery origin is widely patent. The vessel is seen to opacify to the cranial skull base. Patency is seen of the right vertebrobasilar junction and the right posterior-inferior cerebellar artery. The basilar artery, the posterior cerebral arteries, the superior cerebellar arteries and the anterior-inferior cerebellar arteries demonstrate patency into the capillary and venous phases. Manual compression with quick clot was performed for hemostasis at the right groin puncture site. Distal pulses continued to demonstrate Dopplerable bilateral PT, and now Dopplerable left DP. A flat panel CT of the brain demonstrated no evidence of intracranial hemorrhage. Patient was left intubated to protect her airway and unresponsiveness prior to intubation. She was then transferred to the neuro ICU for post revascularization care. IMPRESSION: Status post complete revascularization of occluded left middle cerebral artery with 1 pass with the 3 mm  x 40 mm Solitaire X retrieval device, and contact aspiration achieving a TICI 3 revascularization. Moderate sized left carotid bulb WEB. PLAN: Follow-up as per referring MD. Electronically Signed   By: Julieanne Cotton M.D.   On: 08/16/2022 06:08   ECHOCARDIOGRAM COMPLETE  Result Date: 08/14/2022    ECHOCARDIOGRAM REPORT   Patient Name:   [name] Date of Exam: 08/14/2022 Medical Rec #:  [id number]       Height:       66.0 in Accession #:    [id number]      Weight:       110.0 lb Date of Birth:  1955-12-02       BSA:          1.551 m Patient Age:    66 years        BP:           141/91 mmHg Patient Gender: M               HR:           70 bpm. Exam Location:  Inpatient Procedure: 2D Echo, 3D Echo, Cardiac Doppler, Color Doppler and Strain Analysis Indications:    Stroke I63.9  History:        Patient has prior history of Echocardiogram examinations, most                 recent 03/02/2021. CAD, Prior CABG; Stroke. Non-Hodgkin's                 lymphoma, s/p portacath placement followed by removal on 07/26/22,                 CAD s/p CABG x 4 in 2010, CHF and  Cardiology Consultation   Patient ID: [name] MRN: [id number]; DOB: 1955-06-11  Admit date: 08/14/2022 Date of Consult: 08/16/2022  PCP:  Oneita Hurt, No   [facility] Providers Cardiologist:  None     Patient Profile:   [name] is a 67 y.o. male with a hx of CAD s/p 4v CABG, CVA, substance abuse, non-hodgkin's lymphoma, HFrEF (40-45%) who is being seen 08/16/2022 for the evaluation of CHF at the request of Dr. Otelia Limes.  History of Present Illness:   [name] is a 67 yo male with PMH noted above. He has been followed by Dr. Anne Fu as an outpatient. Previously was lost to follow-up. He was seen Georgie Chard, NP on 09/05/2019. 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 Bulky right hilar and central perihilar mass. 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 Had been followed by Dr. Arbutus Ped. Last seen by Dr. Anne Fu in 03/2020 and [place] at that time. Since then he has been lost to follow up.   Did have an echo 02/2021 while inpatient that showed LVEF of 40-45%, akinesis of the entire apex, similar to prior echo from 2021.  He presented to the ED on 5/20 with complaints of right-sided weakness, right gaze and aphasia with his last known normal around 4:30 AM that day.  Per notes it appears he was found at a residential building where drug exchange was taking place.  On arrival a code stroke was called.  CT head showed bilateral subdural hematomas, no fracture.  CT a of head and neck was positive for left MCA and distal M1.  He was evaluated by neurology and neuro interventional radiologist with decision made to proceed with thrombectomy with two-physician consent as the patient was unable to make medical decisions and there was no family present.  PCCM was consulted as the patient was intubated prior to IR procedure.  Labs on admission showed sodium 134, potassium 3.7, creatinine 0.9, WBC 3.7, hemoglobin 13.3.   UDS positive for cocaine.  EKG showed sinus rhythm, 86 bpm, PVCs with probable anterior lateral infarct.   Echocardiogram 5/20 with LVEF of 20 to 25%, severe global hypokinesis, abnormal global longitudinal strain, normal RV size and function, moderately dilated LA, mildly dilated RA, trivial MR.   He self extubated on 5/21.  He has been started on aspirin and Plavix per neurology recommendations for at least 3 months then continue aspirin as monotherapy.  Cardiology now asked to evaluate regarding cardiomyopathy.   In talking with patient, he has a home and family/friends that help him with his care. States he was taking no medications prior to admission. Reports illicit drug use with cocaine. Denies any anginal episodes prior to admission.     History reviewed. No pertinent past medical history.  Past Surgical History:  Procedure Laterality Date   IR ANGIO INTRA EXTRACRAN SEL COM CAROTID INNOMINATE UNI R MOD SED  08/14/2022   IR ANGIO VERTEBRAL SEL SUBCLAVIAN INNOMINATE UNI R MOD SED  08/14/2022   IR CT HEAD LTD  08/14/2022   IR PERCUTANEOUS ART THROMBECTOMY/INFUSION INTRACRANIAL INC DIAG ANGIO  08/14/2022   RADIOLOGY WITH ANESTHESIA N/A 08/14/2022   Procedure: IR WITH ANESTHESIA;  Surgeon: Julieanne Cotton, MD;  Location: [facility] OR;  Service: Radiology;  Laterality: N/A;     Home Medications:  Prior to Admission medications   Not on File    Inpatient Medications: Scheduled Meds:  aspirin  300 mg Rectal  Cardiology Consultation   Patient ID: [name] MRN: [id number]; DOB: 1955-06-11  Admit date: 08/14/2022 Date of Consult: 08/16/2022  PCP:  Oneita Hurt, No   [facility] Providers Cardiologist:  None     Patient Profile:   [name] is a 67 y.o. male with a hx of CAD s/p 4v CABG, CVA, substance abuse, non-hodgkin's lymphoma, HFrEF (40-45%) who is being seen 08/16/2022 for the evaluation of CHF at the request of Dr. Otelia Limes.  History of Present Illness:   [name] is a 67 yo male with PMH noted above. He has been followed by Dr. Anne Fu as an outpatient. Previously was lost to follow-up. He was seen Georgie Chard, NP on 09/05/2019. 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 Bulky right hilar and central perihilar mass. 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 Had been followed by Dr. Arbutus Ped. Last seen by Dr. Anne Fu in 03/2020 and [place] living [place] at that time. Since then he has been lost to follow up.   Did have an echo 02/2021 while inpatient that showed LVEF of 40-45%, akinesis of the entire apex, similar to prior echo from 2021.  He presented to the ED on 5/20 with complaints of right-sided weakness, right gaze and aphasia with his last known normal around 4:30 AM that day.  Per notes it appears he was found at a residential building where drug exchange was taking place.  On arrival a code stroke was called.  CT head showed bilateral subdural hematomas, no fracture.  CT a of head and neck was positive for left MCA and distal M1.  He was evaluated by neurology and neuro interventional radiologist with decision made to proceed with thrombectomy with two-physician consent as the patient was unable to make medical decisions and there was no family present.  PCCM was consulted as the patient was intubated prior to IR procedure.  Labs on admission showed sodium 134, potassium 3.7, creatinine 0.9, WBC 3.7, hemoglobin 13.3.   UDS positive for cocaine.  EKG showed sinus rhythm, 86 bpm, PVCs with probable anterior lateral infarct.   Echocardiogram 5/20 with LVEF of 20 to 25%, severe global hypokinesis, abnormal global longitudinal strain, normal RV size and function, moderately dilated LA, mildly dilated RA, trivial MR.   He self extubated on 5/21.  He has been started on aspirin and Plavix per neurology recommendations for at least 3 months then continue aspirin as monotherapy.  Cardiology now asked to evaluate regarding cardiomyopathy.   In talking with patient, he has a home and family/friends that help him with his care. States he was taking no medications prior to admission. Reports illicit drug use with cocaine. Denies any anginal episodes prior to admission.     History reviewed. No pertinent past medical history.  Past Surgical History:  Procedure Laterality Date   IR ANGIO INTRA EXTRACRAN SEL COM CAROTID INNOMINATE UNI R MOD SED  08/14/2022   IR ANGIO VERTEBRAL SEL SUBCLAVIAN INNOMINATE UNI R MOD SED  08/14/2022   IR CT HEAD LTD  08/14/2022   IR PERCUTANEOUS ART THROMBECTOMY/INFUSION INTRACRANIAL INC DIAG ANGIO  08/14/2022   RADIOLOGY WITH ANESTHESIA N/A 08/14/2022   Procedure: IR WITH ANESTHESIA;  Surgeon: Julieanne Cotton, MD;  Location: [facility] OR;  Service: Radiology;  Laterality: N/A;     Home Medications:  Prior to Admission medications   Not on File    Inpatient Medications: Scheduled Meds:  aspirin  300 mg Rectal  Cardiology Consultation   Patient ID: [name] MRN: [id number]; DOB: 1955-06-11  Admit date: 08/14/2022 Date of Consult: 08/16/2022  PCP:  Oneita Hurt, No   [facility] Providers Cardiologist:  None     Patient Profile:   [name] is a 67 y.o. male with a hx of CAD s/p 4v CABG, CVA, substance abuse, non-hodgkin's lymphoma, HFrEF (40-45%) who is being seen 08/16/2022 for the evaluation of CHF at the request of Dr. Otelia Limes.  History of Present Illness:   [name] is a 67 yo male with PMH noted above. He has been followed by Dr. Anne Fu as an outpatient. Previously was lost to follow-up. He was seen Georgie Chard, NP on 09/05/2019. 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 Bulky right hilar and central perihilar mass. 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 Had been followed by Dr. Arbutus Ped. Last seen by Dr. Anne Fu in 03/2020 and [place] living [place] at that time. Since then he has been lost to follow up.   Did have an echo 02/2021 while inpatient that showed LVEF of 40-45%, akinesis of the entire apex, similar to prior echo from 2021.  He presented to the ED on 5/20 with complaints of right-sided weakness, right gaze and aphasia with his last known normal around 4:30 AM that day.  Per notes it appears he was found at a residential building where drug exchange was taking place.  On arrival a code stroke was called.  CT head showed bilateral subdural hematomas, no fracture.  CT a of head and neck was positive for left MCA and distal M1.  He was evaluated by neurology and neuro interventional radiologist with decision made to proceed with thrombectomy with two-physician consent as the patient was unable to make medical decisions and there was no family present.  PCCM was consulted as the patient was intubated prior to IR procedure.  Labs on admission showed sodium 134, potassium 3.7, creatinine 0.9, WBC 3.7, hemoglobin 13.3.   UDS positive for cocaine.  EKG showed sinus rhythm, 86 bpm, PVCs with probable anterior lateral infarct.   Echocardiogram 5/20 with LVEF of 20 to 25%, severe global hypokinesis, abnormal global longitudinal strain, normal RV size and function, moderately dilated LA, mildly dilated RA, trivial MR.   He self extubated on 5/21.  He has been started on aspirin and Plavix per neurology recommendations for at least 3 months then continue aspirin as monotherapy.  Cardiology now asked to evaluate regarding cardiomyopathy.   In talking with patient, he has a home and family/friends that help him with his care. States he was taking no medications prior to admission. Reports illicit drug use with cocaine. Denies any anginal episodes prior to admission.     History reviewed. No pertinent past medical history.  Past Surgical History:  Procedure Laterality Date   IR ANGIO INTRA EXTRACRAN SEL COM CAROTID INNOMINATE UNI R MOD SED  08/14/2022   IR ANGIO VERTEBRAL SEL SUBCLAVIAN INNOMINATE UNI R MOD SED  08/14/2022   IR CT HEAD LTD  08/14/2022   IR PERCUTANEOUS ART THROMBECTOMY/INFUSION INTRACRANIAL INC DIAG ANGIO  08/14/2022   RADIOLOGY WITH ANESTHESIA N/A 08/14/2022   Procedure: IR WITH ANESTHESIA;  Surgeon: Julieanne Cotton, MD;  Location: [facility];  Service: Radiology;  Laterality: N/A;     Home Medications:  Prior to Admission medications   Not on File    Inpatient Medications: Scheduled Meds:  aspirin  300 mg Rectal  of the MCA distribution was obtained. A control arteriogram was then performed at 5 and 10  minutes post aspiration. These continued to demonstrate complete revascularization of the left MCA distribution. A final control arteriogram performed through the balloon guide catheter in the left common carotid artery demonstrated patency of the left internal carotid artery extra cranially and intracranially with maintenance of a TICI 3 revascularization of the left middle cerebral artery distribution. Selective diagnostic arteriograms of the right common carotid artery and of the right vertebral artery were also performed. The right common carotid arteriogram demonstrates patency of the right external carotid artery and its branches. The right internal carotid artery at the bulb demonstrates a small smooth focal outpouching at the bulb. The internal carotid artery distally demonstrates wide patency in the petrous, the cavernous and the supraclinoid ICA. The right middle cerebral artery and the right anterior cerebral artery opacify into the capillary and venous phases. The right vertebral artery origin is widely patent. The vessel is seen to opacify to the cranial skull base. Patency is seen of the right vertebrobasilar junction and the right posterior-inferior cerebellar artery. The basilar artery, the posterior cerebral arteries, the superior cerebellar arteries and the anterior-inferior cerebellar arteries demonstrate patency into the capillary and venous phases. Manual compression with quick clot was performed for hemostasis at the right groin puncture site. Distal pulses continued to demonstrate Dopplerable bilateral PT, and now Dopplerable left DP. A flat panel CT of the brain demonstrated no evidence of intracranial hemorrhage. Patient was left intubated to protect her airway and unresponsiveness prior to intubation. She was then transferred to the neuro ICU for post revascularization care. IMPRESSION: Status post complete revascularization of occluded left middle cerebral artery with 1 pass with the 3 mm  x 40 mm Solitaire X retrieval device, and contact aspiration achieving a TICI 3 revascularization. Moderate sized left carotid bulb WEB. PLAN: Follow-up as per referring MD. Electronically Signed   By: Julieanne Cotton M.D.   On: 08/16/2022 06:08   ECHOCARDIOGRAM COMPLETE  Result Date: 08/14/2022    ECHOCARDIOGRAM REPORT   Patient Name:   [name] Date of Exam: 08/14/2022 Medical Rec #:  [id number]       Height:       66.0 in Accession #:    [id number]      Weight:       110.0 lb Date of Birth:  1955-12-02       BSA:          1.551 m Patient Age:    66 years        BP:           141/91 mmHg Patient Gender: M               HR:           70 bpm. Exam Location:  Inpatient Procedure: 2D Echo, 3D Echo, Cardiac Doppler, Color Doppler and Strain Analysis Indications:    Stroke I63.9  History:        Patient has prior history of Echocardiogram examinations, most                 recent 03/02/2021. CAD, Prior CABG; Stroke. Non-Hodgkin's                 lymphoma, s/p portacath placement followed by removal on 07/26/22,                 CAD s/p CABG x 4 in 2010, CHF and  Cardiology Consultation   Patient ID: [name] MRN: [id number]; DOB: 1955-06-11  Admit date: 08/14/2022 Date of Consult: 08/16/2022  PCP:  Oneita Hurt, No   [facility] Providers Cardiologist:  None     Patient Profile:   [name] is a 67 y.o. male with a hx of CAD s/p 4v CABG, CVA, substance abuse, non-hodgkin's lymphoma, HFrEF (40-45%) who is being seen 08/16/2022 for the evaluation of CHF at the request of Dr. Otelia Limes.  History of Present Illness:   [name] is a 67 yo male with PMH noted above. He has been followed by Dr. Anne Fu as an outpatient. Previously was lost to follow-up. He was seen Georgie Chard, NP on 09/05/2019. 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 Bulky right hilar and central perihilar mass. 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 Had been followed by Dr. Arbutus Ped. Last seen by Dr. Anne Fu in 03/2020 and [place] living in [place] at that time. Since then he has been lost to follow up.   Did have an echo 02/2021 while inpatient that showed LVEF of 40-45%, akinesis of the entire apex, similar to prior echo from 2021.  He presented to the ED on 5/20 with complaints of right-sided weakness, right gaze and aphasia with his last known normal around 4:30 AM that day.  Per notes it appears he was found at a residential building where drug exchange was taking place.  On arrival a code stroke was called.  CT head showed bilateral subdural hematomas, no fracture.  CT a of head and neck was positive for left MCA and distal M1.  He was evaluated by neurology and neuro interventional radiologist with decision made to proceed with thrombectomy with two-physician consent as the patient was unable to make medical decisions and there was no family present.  PCCM was consulted as the patient was intubated prior to IR procedure.  Labs on admission showed sodium 134, potassium 3.7, creatinine 0.9, WBC 3.7, hemoglobin 13.3.   UDS positive for cocaine.  EKG showed sinus rhythm, 86 bpm, PVCs with probable anterior lateral infarct.   Echocardiogram 5/20 with LVEF of 20 to 25%, severe global hypokinesis, abnormal global longitudinal strain, normal RV size and function, moderately dilated LA, mildly dilated RA, trivial MR.   He self extubated on 5/21.  He has been started on aspirin and Plavix per neurology recommendations for at least 3 months then continue aspirin as monotherapy.  Cardiology now asked to evaluate regarding cardiomyopathy.   In talking with patient, he has a home and family/friends that help him with his care. States he was taking no medications prior to admission. Reports illicit drug use with cocaine. Denies any anginal episodes prior to admission.     History reviewed. No pertinent past medical history.  Past Surgical History:  Procedure Laterality Date   IR ANGIO INTRA EXTRACRAN SEL COM CAROTID INNOMINATE UNI R MOD SED  08/14/2022   IR ANGIO VERTEBRAL SEL SUBCLAVIAN INNOMINATE UNI R MOD SED  08/14/2022   IR CT HEAD LTD  08/14/2022   IR PERCUTANEOUS ART THROMBECTOMY/INFUSION INTRACRANIAL INC DIAG ANGIO  08/14/2022   RADIOLOGY WITH ANESTHESIA N/A 08/14/2022   Procedure: IR WITH ANESTHESIA;  Surgeon: Julieanne Cotton, MD;  Location: [facility] OR;  Service: Radiology;  Laterality: N/A;     Home Medications:  Prior to Admission medications   Not on File    Inpatient Medications: Scheduled Meds:  aspirin  300 mg Rectal  Cardiology Consultation   Patient ID: [name] MRN: [id number]; DOB: 1955-06-11  Admit date: 08/14/2022 Date of Consult: 08/16/2022  PCP:  Oneita Hurt, No   [facility] Providers Cardiologist:  None     Patient Profile:   [name] is a 67 y.o. male with a hx of CAD s/p 4v CABG, CVA, substance abuse, non-hodgkin's lymphoma, HFrEF (40-45%) who is being seen 08/16/2022 for the evaluation of CHF at the request of Dr. Otelia Limes.  History of Present Illness:   [name] is a 67 yo male with PMH noted above. He has been followed by Dr. Anne Fu as an outpatient. Previously was lost to follow-up. He was seen Georgie Chard, NP on 09/05/2019. 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 Bulky right hilar and central perihilar mass. 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 Had been followed by Dr. Arbutus Ped. Last seen by Dr. Anne Fu in 03/2020 and [place] living in [place] at that time. Since then he has been lost to follow up.   Did have an echo 02/2021 while inpatient that showed LVEF of 40-45%, akinesis of the entire apex, similar to prior echo from 2021.  He presented to the ED on 5/20 with complaints of right-sided weakness, right gaze and aphasia with his last known normal around 4:30 AM that day.  Per notes it appears he was found at a residential building where drug exchange was taking place.  On arrival a code stroke was called.  CT head showed bilateral subdural hematomas, no fracture.  CT a of head and neck was positive for left MCA and distal M1.  He was evaluated by neurology and neuro interventional radiologist with decision made to proceed with thrombectomy with two-physician consent as the patient was unable to make medical decisions and there was no family present.  PCCM was consulted as the patient was intubated prior to IR procedure.  Labs on admission showed sodium 134, potassium 3.7, creatinine 0.9, WBC 3.7, hemoglobin 13.3.   UDS positive for cocaine.  EKG showed sinus rhythm, 86 bpm, PVCs with probable anterior lateral infarct.   Echocardiogram 5/20 with LVEF of 20 to 25%, severe global hypokinesis, abnormal global longitudinal strain, normal RV size and function, moderately dilated LA, mildly dilated RA, trivial MR.   He self extubated on 5/21.  He has been started on aspirin and Plavix per neurology recommendations for at least 3 months then continue aspirin as monotherapy.  Cardiology now asked to evaluate regarding cardiomyopathy.   In talking with patient, he has a home and family/friends that help him with his care. States he was taking no medications prior to admission. Reports illicit drug use with cocaine. Denies any anginal episodes prior to admission.     History reviewed. No pertinent past medical history.  Past Surgical History:  Procedure Laterality Date   IR ANGIO INTRA EXTRACRAN SEL COM CAROTID INNOMINATE UNI R MOD SED  08/14/2022   IR ANGIO VERTEBRAL SEL SUBCLAVIAN INNOMINATE UNI R MOD SED  08/14/2022   IR CT HEAD LTD  08/14/2022   IR PERCUTANEOUS ART THROMBECTOMY/INFUSION INTRACRANIAL INC DIAG ANGIO  08/14/2022   RADIOLOGY WITH ANESTHESIA N/A 08/14/2022   Procedure: IR WITH ANESTHESIA;  Surgeon: Julieanne Cotton, MD;  Location: [facility];  Service: Radiology;  Laterality: N/A;     Home Medications:  Prior to Admission medications   Not on File    Inpatient Medications: Scheduled Meds:  aspirin  300 mg Rectal  Cardiology Consultation   Patient ID: [name] MRN: [id number]; DOB: 1955-06-11  Admit date: 08/14/2022 Date of Consult: 08/16/2022  PCP:  Oneita Hurt, No   [facility] Providers Cardiologist:  None     Patient Profile:   [name] is a 67 y.o. male with a hx of CAD s/p 4v CABG, CVA, substance abuse, non-hodgkin's lymphoma, HFrEF (40-45%) who is being seen 08/16/2022 for the evaluation of CHF at the request of Dr. Otelia Limes.  History of Present Illness:   [name] is a 67 yo male with PMH noted above. He has been followed by Dr. Anne Fu as an outpatient. Previously was lost to follow-up. He was seen Georgie Chard, NP on 09/05/2019. 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 Bulky right hilar and central perihilar mass. 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 Had been followed by Dr. Arbutus Ped. Last seen by Dr. Anne Fu in 03/2020 and [place] living in [place] at that time. Since then he has been lost to follow up.   Did have an echo 02/2021 while inpatient that showed LVEF of 40-45%, akinesis of the entire apex, similar to prior echo from 2021.  He presented to the ED on 5/20 with complaints of right-sided weakness, right gaze and aphasia with his last known normal around 4:30 AM that day.  Per notes it appears he was found at a residential building where drug exchange was taking place.  On arrival a code stroke was called.  CT head showed bilateral subdural hematomas, no fracture.  CT a of head and neck was positive for left MCA and distal M1.  He was evaluated by neurology and neuro interventional radiologist with decision made to proceed with thrombectomy with two-physician consent as the patient was unable to make medical decisions and there was no family present.  PCCM was consulted as the patient was intubated prior to IR procedure.  Labs on admission showed sodium 134, potassium 3.7, creatinine 0.9, WBC 3.7, hemoglobin 13.3.   UDS positive for cocaine.  EKG showed sinus rhythm, 86 bpm, PVCs with probable anterior lateral infarct.   Echocardiogram 5/20 with LVEF of 20 to 25%, severe global hypokinesis, abnormal global longitudinal strain, normal RV size and function, moderately dilated LA, mildly dilated RA, trivial MR.   He self extubated on 5/21.  He has been started on aspirin and Plavix per neurology recommendations for at least 3 months then continue aspirin as monotherapy.  Cardiology now asked to evaluate regarding cardiomyopathy.   In talking with patient, he has a home and family/friends that help him with his care. States he was taking no medications prior to admission. Reports illicit drug use with cocaine. Denies any anginal episodes prior to admission.     History reviewed. No pertinent past medical history.  Past Surgical History:  Procedure Laterality Date   IR ANGIO INTRA EXTRACRAN SEL COM CAROTID INNOMINATE UNI R MOD SED  08/14/2022   IR ANGIO VERTEBRAL SEL SUBCLAVIAN INNOMINATE UNI R MOD SED  08/14/2022   IR CT HEAD LTD  08/14/2022   IR PERCUTANEOUS ART THROMBECTOMY/INFUSION INTRACRANIAL INC DIAG ANGIO  08/14/2022   RADIOLOGY WITH ANESTHESIA N/A 08/14/2022   Procedure: IR WITH ANESTHESIA;  Surgeon: Julieanne Cotton, MD;  Location: [facility];  Service: Radiology;  Laterality: N/A;     Home Medications:  Prior to Admission medications   Not on File    Inpatient Medications: Scheduled Meds:  aspirin  300 mg Rectal  of the MCA distribution was obtained. A control arteriogram was then performed at 5 and 10  minutes post aspiration. These continued to demonstrate complete revascularization of the left MCA distribution. A final control arteriogram performed through the balloon guide catheter in the left common carotid artery demonstrated patency of the left internal carotid artery extra cranially and intracranially with maintenance of a TICI 3 revascularization of the left middle cerebral artery distribution. Selective diagnostic arteriograms of the right common carotid artery and of the right vertebral artery were also performed. The right common carotid arteriogram demonstrates patency of the right external carotid artery and its branches. The right internal carotid artery at the bulb demonstrates a small smooth focal outpouching at the bulb. The internal carotid artery distally demonstrates wide patency in the petrous, the cavernous and the supraclinoid ICA. The right middle cerebral artery and the right anterior cerebral artery opacify into the capillary and venous phases. The right vertebral artery origin is widely patent. The vessel is seen to opacify to the cranial skull base. Patency is seen of the right vertebrobasilar junction and the right posterior-inferior cerebellar artery. The basilar artery, the posterior cerebral arteries, the superior cerebellar arteries and the anterior-inferior cerebellar arteries demonstrate patency into the capillary and venous phases. Manual compression with quick clot was performed for hemostasis at the right groin puncture site. Distal pulses continued to demonstrate Dopplerable bilateral PT, and now Dopplerable left DP. A flat panel CT of the brain demonstrated no evidence of intracranial hemorrhage. Patient was left intubated to protect her airway and unresponsiveness prior to intubation. She was then transferred to the neuro ICU for post revascularization care. IMPRESSION: Status post complete revascularization of occluded left middle cerebral artery with 1 pass with the 3 mm  x 40 mm Solitaire X retrieval device, and contact aspiration achieving a TICI 3 revascularization. Moderate sized left carotid bulb WEB. PLAN: Follow-up as per referring MD. Electronically Signed   By: Julieanne Cotton M.D.   On: 08/16/2022 06:08   ECHOCARDIOGRAM COMPLETE  Result Date: 08/14/2022    ECHOCARDIOGRAM REPORT   Patient Name:   [name] Date of Exam: 08/14/2022 Medical Rec #:  [id number]       Height:       66.0 in Accession #:    [id number]      Weight:       110.0 lb Date of Birth:  1955-12-02       BSA:          1.551 m Patient Age:    66 years        BP:           141/91 mmHg Patient Gender: M               HR:           70 bpm. Exam Location:  Inpatient Procedure: 2D Echo, 3D Echo, Cardiac Doppler, Color Doppler and Strain Analysis Indications:    Stroke I63.9  History:        Patient has prior history of Echocardiogram examinations, most                 recent 03/02/2021. CAD, Prior CABG; Stroke. Non-Hodgkin's                 lymphoma, s/p portacath placement followed by removal on 07/26/22,                 CAD s/p CABG x 4 in 2010, CHF and  of the MCA distribution was obtained. A control arteriogram was then performed at 5 and 10  minutes post aspiration. These continued to demonstrate complete revascularization of the left MCA distribution. A final control arteriogram performed through the balloon guide catheter in the left common carotid artery demonstrated patency of the left internal carotid artery extra cranially and intracranially with maintenance of a TICI 3 revascularization of the left middle cerebral artery distribution. Selective diagnostic arteriograms of the right common carotid artery and of the right vertebral artery were also performed. The right common carotid arteriogram demonstrates patency of the right external carotid artery and its branches. The right internal carotid artery at the bulb demonstrates a small smooth focal outpouching at the bulb. The internal carotid artery distally demonstrates wide patency in the petrous, the cavernous and the supraclinoid ICA. The right middle cerebral artery and the right anterior cerebral artery opacify into the capillary and venous phases. The right vertebral artery origin is widely patent. The vessel is seen to opacify to the cranial skull base. Patency is seen of the right vertebrobasilar junction and the right posterior-inferior cerebellar artery. The basilar artery, the posterior cerebral arteries, the superior cerebellar arteries and the anterior-inferior cerebellar arteries demonstrate patency into the capillary and venous phases. Manual compression with quick clot was performed for hemostasis at the right groin puncture site. Distal pulses continued to demonstrate Dopplerable bilateral PT, and now Dopplerable left DP. A flat panel CT of the brain demonstrated no evidence of intracranial hemorrhage. Patient was left intubated to protect her airway and unresponsiveness prior to intubation. She was then transferred to the neuro ICU for post revascularization care. IMPRESSION: Status post complete revascularization of occluded left middle cerebral artery with 1 pass with the 3 mm  x 40 mm Solitaire X retrieval device, and contact aspiration achieving a TICI 3 revascularization. Moderate sized left carotid bulb WEB. PLAN: Follow-up as per referring MD. Electronically Signed   By: Julieanne Cotton M.D.   On: 08/16/2022 06:08   ECHOCARDIOGRAM COMPLETE  Result Date: 08/14/2022    ECHOCARDIOGRAM REPORT   Patient Name:   [name] Date of Exam: 08/14/2022 Medical Rec #:  [id number]       Height:       66.0 in Accession #:    [id number]      Weight:       110.0 lb Date of Birth:  1955-12-02       BSA:          1.551 m Patient Age:    66 years        BP:           141/91 mmHg Patient Gender: M               HR:           70 bpm. Exam Location:  Inpatient Procedure: 2D Echo, 3D Echo, Cardiac Doppler, Color Doppler and Strain Analysis Indications:    Stroke I63.9  History:        Patient has prior history of Echocardiogram examinations, most                 recent 03/02/2021. CAD, Prior CABG; Stroke. Non-Hodgkin's                 lymphoma, s/p portacath placement followed by removal on 07/26/22,                 CAD s/p CABG x 4 in 2010, CHF and  Cardiology Consultation   Patient ID: [name] MRN: [id number]; DOB: 1955-06-11  Admit date: 08/14/2022 Date of Consult: 08/16/2022  PCP:  Oneita Hurt, No   [facility] Providers Cardiologist:  None     Patient Profile:   [name] is a 67 y.o. male with a hx of CAD s/p 4v CABG, CVA, substance abuse, non-hodgkin's lymphoma, HFrEF (40-45%) who is being seen 08/16/2022 for the evaluation of CHF at the request of Dr. Otelia Limes.  History of Present Illness:   [name] is a 67 yo male with PMH noted above. He has been followed by Dr. Anne Fu as an outpatient. Previously was lost to follow-up. He was seen Georgie Chard, NP on 09/05/2019. 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 Bulky right hilar and central perihilar mass. 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 Had been followed by Dr. Arbutus Ped. Last seen by Dr. Anne Fu in 03/2020 and [place] living in [place] at that time. Since then he has been lost to follow up.   Did have an echo 02/2021 while inpatient that showed LVEF of 40-45%, akinesis of the entire apex, similar to prior echo from 2021.  He presented to the ED on 5/20 with complaints of right-sided weakness, right gaze and aphasia with his last known normal around 4:30 AM that day.  Per notes it appears he was found at a residential building where drug exchange was taking place.  On arrival a code stroke was called.  CT head showed bilateral subdural hematomas, no fracture.  CT a of head and neck was positive for left MCA and distal M1.  He was evaluated by neurology and neuro interventional radiologist with decision made to proceed with thrombectomy with two-physician consent as the patient was unable to make medical decisions and there was no family present.  PCCM was consulted as the patient was intubated prior to IR procedure.  Labs on admission showed sodium 134, potassium 3.7, creatinine 0.9, WBC 3.7, hemoglobin 13.3.   UDS positive for cocaine.  EKG showed sinus rhythm, 86 bpm, PVCs with probable anterior lateral infarct.   Echocardiogram 5/20 with LVEF of 20 to 25%, severe global hypokinesis, abnormal global longitudinal strain, normal RV size and function, moderately dilated LA, mildly dilated RA, trivial MR.   He self extubated on 5/21.  He has been started on aspirin and Plavix per neurology recommendations for at least 3 months then continue aspirin as monotherapy.  Cardiology now asked to evaluate regarding cardiomyopathy.   In talking with patient, he has a home and family/friends that help him with his care. States he was taking no medications prior to admission. Reports illicit drug use with cocaine. Denies any anginal episodes prior to admission.     History reviewed. No pertinent past medical history.  Past Surgical History:  Procedure Laterality Date   IR ANGIO INTRA EXTRACRAN SEL COM CAROTID INNOMINATE UNI R MOD SED  08/14/2022   IR ANGIO VERTEBRAL SEL SUBCLAVIAN INNOMINATE UNI R MOD SED  08/14/2022   IR CT HEAD LTD  08/14/2022   IR PERCUTANEOUS ART THROMBECTOMY/INFUSION INTRACRANIAL INC DIAG ANGIO  08/14/2022   RADIOLOGY WITH ANESTHESIA N/A 08/14/2022   Procedure: IR WITH ANESTHESIA;  Surgeon: Julieanne Cotton, MD;  Location: [facility] OR;  Service: Radiology;  Laterality: N/A;     Home Medications:  Prior to Admission medications   Not on File    Inpatient Medications: Scheduled Meds:  aspirin  300 mg Rectal  of the MCA distribution was obtained. A control arteriogram was then performed at 5 and 10  minutes post aspiration. These continued to demonstrate complete revascularization of the left MCA distribution. A final control arteriogram performed through the balloon guide catheter in the left common carotid artery demonstrated patency of the left internal carotid artery extra cranially and intracranially with maintenance of a TICI 3 revascularization of the left middle cerebral artery distribution. Selective diagnostic arteriograms of the right common carotid artery and of the right vertebral artery were also performed. The right common carotid arteriogram demonstrates patency of the right external carotid artery and its branches. The right internal carotid artery at the bulb demonstrates a small smooth focal outpouching at the bulb. The internal carotid artery distally demonstrates wide patency in the petrous, the cavernous and the supraclinoid ICA. The right middle cerebral artery and the right anterior cerebral artery opacify into the capillary and venous phases. The right vertebral artery origin is widely patent. The vessel is seen to opacify to the cranial skull base. Patency is seen of the right vertebrobasilar junction and the right posterior-inferior cerebellar artery. The basilar artery, the posterior cerebral arteries, the superior cerebellar arteries and the anterior-inferior cerebellar arteries demonstrate patency into the capillary and venous phases. Manual compression with quick clot was performed for hemostasis at the right groin puncture site. Distal pulses continued to demonstrate Dopplerable bilateral PT, and now Dopplerable left DP. A flat panel CT of the brain demonstrated no evidence of intracranial hemorrhage. Patient was left intubated to protect her airway and unresponsiveness prior to intubation. She was then transferred to the neuro ICU for post revascularization care. IMPRESSION: Status post complete revascularization of occluded left middle cerebral artery with 1 pass with the 3 mm  x 40 mm Solitaire X retrieval device, and contact aspiration achieving a TICI 3 revascularization. Moderate sized left carotid bulb WEB. PLAN: Follow-up as per referring MD. Electronically Signed   By: Julieanne Cotton M.D.   On: 08/16/2022 06:08   ECHOCARDIOGRAM COMPLETE  Result Date: 08/14/2022    ECHOCARDIOGRAM REPORT   Patient Name:   [name] Date of Exam: 08/14/2022 Medical Rec #:  [id number]       Height:       66.0 in Accession #:    [id number]      Weight:       110.0 lb Date of Birth:  1955-12-02       BSA:          1.551 m Patient Age:    66 years        BP:           141/91 mmHg Patient Gender: M               HR:           70 bpm. Exam Location:  Inpatient Procedure: 2D Echo, 3D Echo, Cardiac Doppler, Color Doppler and Strain Analysis Indications:    Stroke I63.9  History:        Patient has prior history of Echocardiogram examinations, most                 recent 03/02/2021. CAD, Prior CABG; Stroke. Non-Hodgkin's                 lymphoma, s/p portacath placement followed by removal on 07/26/22,                 CAD s/p CABG x 4 in 2010, CHF and  Cardiology Consultation   Patient ID: [name] MRN: [id number]; DOB: 1955-06-11  Admit date: 08/14/2022 Date of Consult: 08/16/2022  PCP:  Oneita Hurt, No   [facility] Providers Cardiologist:  None     Patient Profile:   [name] is a 67 y.o. male with a hx of CAD s/p 4v CABG, CVA, substance abuse, non-hodgkin's lymphoma, HFrEF (40-45%) who is being seen 08/16/2022 for the evaluation of CHF at the request of Dr. Otelia Limes.  History of Present Illness:   [name] is a 67 yo male with PMH noted above. He has been followed by Dr. Anne Fu as an outpatient. Previously was lost to follow-up. He was seen Georgie Chard, NP on 09/05/2019. 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 Bulky right hilar and central perihilar mass. 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 Had been followed by Dr. Arbutus Ped. Last seen by Dr. Anne Fu in 03/2020 and [place] living in [place] at that time. Since then he has been lost to follow up.   Did have an echo 02/2021 while inpatient that showed LVEF of 40-45%, akinesis of the entire apex, similar to prior echo from 2021.  He presented to the ED on 5/20 with complaints of right-sided weakness, right gaze and aphasia with his last known normal around 4:30 AM that day.  Per notes it appears he was found at a residential building where drug exchange was taking place.  On arrival a code stroke was called.  CT head showed bilateral subdural hematomas, no fracture.  CT a of head and neck was positive for left MCA and distal M1.  He was evaluated by neurology and neuro interventional radiologist with decision made to proceed with thrombectomy with two-physician consent as the patient was unable to make medical decisions and there was no family present.  PCCM was consulted as the patient was intubated prior to IR procedure.  Labs on admission showed sodium 134, potassium 3.7, creatinine 0.9, WBC 3.7, hemoglobin 13.3.   UDS positive for cocaine.  EKG showed sinus rhythm, 86 bpm, PVCs with probable anterior lateral infarct.   Echocardiogram 5/20 with LVEF of 20 to 25%, severe global hypokinesis, abnormal global longitudinal strain, normal RV size and function, moderately dilated LA, mildly dilated RA, trivial MR.   He self extubated on 5/21.  He has been started on aspirin and Plavix per neurology recommendations for at least 3 months then continue aspirin as monotherapy.  Cardiology now asked to evaluate regarding cardiomyopathy.   In talking with patient, he has a home and family/friends that help him with his care. States he was taking no medications prior to admission. Reports illicit drug use with cocaine. Denies any anginal episodes prior to admission.     History reviewed. No pertinent past medical history.  Past Surgical History:  Procedure Laterality Date   IR ANGIO INTRA EXTRACRAN SEL COM CAROTID INNOMINATE UNI R MOD SED  08/14/2022   IR ANGIO VERTEBRAL SEL SUBCLAVIAN INNOMINATE UNI R MOD SED  08/14/2022   IR CT HEAD LTD  08/14/2022   IR PERCUTANEOUS ART THROMBECTOMY/INFUSION INTRACRANIAL INC DIAG ANGIO  08/14/2022   RADIOLOGY WITH ANESTHESIA N/A 08/14/2022   Procedure: IR WITH ANESTHESIA;  Surgeon: Julieanne Cotton, MD;  Location: [facility] OR;  Service: Radiology;  Laterality: N/A;     Home Medications:  Prior to Admission medications   Not on File    Inpatient Medications: Scheduled Meds:  aspirin  300 mg Rectal  of the MCA distribution was obtained. A control arteriogram was then performed at 5 and 10  minutes post aspiration. These continued to demonstrate complete revascularization of the left MCA distribution. A final control arteriogram performed through the balloon guide catheter in the left common carotid artery demonstrated patency of the left internal carotid artery extra cranially and intracranially with maintenance of a TICI 3 revascularization of the left middle cerebral artery distribution. Selective diagnostic arteriograms of the right common carotid artery and of the right vertebral artery were also performed. The right common carotid arteriogram demonstrates patency of the right external carotid artery and its branches. The right internal carotid artery at the bulb demonstrates a small smooth focal outpouching at the bulb. The internal carotid artery distally demonstrates wide patency in the petrous, the cavernous and the supraclinoid ICA. The right middle cerebral artery and the right anterior cerebral artery opacify into the capillary and venous phases. The right vertebral artery origin is widely patent. The vessel is seen to opacify to the cranial skull base. Patency is seen of the right vertebrobasilar junction and the right posterior-inferior cerebellar artery. The basilar artery, the posterior cerebral arteries, the superior cerebellar arteries and the anterior-inferior cerebellar arteries demonstrate patency into the capillary and venous phases. Manual compression with quick clot was performed for hemostasis at the right groin puncture site. Distal pulses continued to demonstrate Dopplerable bilateral PT, and now Dopplerable left DP. A flat panel CT of the brain demonstrated no evidence of intracranial hemorrhage. Patient was left intubated to protect her airway and unresponsiveness prior to intubation. She was then transferred to the neuro ICU for post revascularization care. IMPRESSION: Status post complete revascularization of occluded left middle cerebral artery with 1 pass with the 3 mm  x 40 mm Solitaire X retrieval device, and contact aspiration achieving a TICI 3 revascularization. Moderate sized left carotid bulb WEB. PLAN: Follow-up as per referring MD. Electronically Signed   By: Julieanne Cotton M.D.   On: 08/16/2022 06:08   ECHOCARDIOGRAM COMPLETE  Result Date: 08/14/2022    ECHOCARDIOGRAM REPORT   Patient Name:   [name] Date of Exam: 08/14/2022 Medical Rec #:  [id number]       Height:       66.0 in Accession #:    [id number]      Weight:       110.0 lb Date of Birth:  1955-12-02       BSA:          1.551 m Patient Age:    66 years        BP:           141/91 mmHg Patient Gender: M               HR:           70 bpm. Exam Location:  Inpatient Procedure: 2D Echo, 3D Echo, Cardiac Doppler, Color Doppler and Strain Analysis Indications:    Stroke I63.9  History:        Patient has prior history of Echocardiogram examinations, most                 recent 03/02/2021. CAD, Prior CABG; Stroke. Non-Hodgkin's                 lymphoma, s/p portacath placement followed by removal on 07/26/22,                 CAD s/p CABG x 4 in 2010, CHF and

## 2022-08-16 NOTE — Progress Notes (Signed)
Speech Language Pathology Treatment: Dysphagia  Patient Details Name: REMBRANDT DAYAN MRN: [id number] DOB: 1955-06-20 Today's Date: 08/16/2022 Time: 4098-1191 SLP Time Calculation (min) (ACUTE ONLY): 10 min  Assessment / Plan / Recommendation Clinical Impression  Pt initially refused all PO trials. Pt is especially concerned about drinking too much because he feels like he has to urinate too often.  After cognitive linguistic assessment pt accepted PO trials.  There were no clinical s/s of aspiration with any consistencies trialed, including thin liquid by straw.  Pt fed himself puree and regular solid cracker without difficulty.  SLP will sign off for swallowing  Recommend continuing regular texture diet with thin liquid.   HPI HPI: BURNETTE GARVIE is a 67 year old male who presented to ED on 5/20 via EMS with sudden onset right-sided weakness, right gaze, and aphasic. CTA positive for left MCA and distal M1 LVO.  No s/o revascularization in IR.  Remained intubated after procedue with ETT for <24 hours.  Pt with medical history significant for prior stroke with LUE weakness, non-Hodgkin's lymphoma, CAD s/p CABG x 4 in 2010, CHF, and substance abuse.      SLP Plan  Discharge SLP treatment due to (comment);All goals met  Patient needs continued Speech Lanaguage Pathology Services   Recommendations for follow up therapy are one component of a multi-disciplinary discharge planning process, led by the attending physician.  Recommendations may be updated based on patient status, additional functional criteria and insurance authorization.    Recommendations  Diet recommendations: Regular;Thin liquid Liquids provided via: Straw;Cup Medication Administration: Whole meds with liquid Supervision: Patient able to self feed Compensations: Small sips/bites;Slow rate Postural Changes and/or Swallow Maneuvers: Seated upright 90 degrees                  Oral care BID     Dysphagia,  unspecified (R13.10)     Discharge SLP treatment due to (comment);All goals met     Kerrie Pleasure , MA, CCC-SLP Acute Rehabilitation Services Office: [phone] 08/16/2022, 11:25 AM

## 2022-08-16 NOTE — Progress Notes (Signed)
STROKE TEAM PROGRESS NOTE   INTERVAL HISTORY No family is at the bedside.   Patient passed swallow evaluation and is now on a diet.  He is more calm and interactive today.  He states he wants to go home and he has a girlfriend who can look after him.  Therapist recommended rehab in SNF Vitals:   08/16/22 0008 08/16/22 0300 08/16/22 0731 08/16/22 1154  BP: (!) 140/85 119/84 (!) 119/106 97/68  Pulse: 88 88 79 68  Resp: 18 16 14 18   Temp: 98.6 F (37 C) 97.9 F (36.6 C) 98.7 F (37.1 C) 98 F (36.7 C)  TempSrc: Oral Axillary Oral Oral  SpO2: 97% 97%  98%  Weight:      Height:       CBC:  Recent Labs  Lab 08/14/22 0515 08/14/22 0517  WBC 3.7*  --   NEUTROABS 1.8  --   HGB 13.3 13.6  HCT 40.0 40.0  MCV 99.3  --   PLT 207  --    Basic Metabolic Panel:  Recent Labs  Lab 08/14/22 0515 08/14/22 0517  NA 134* 139  K 3.7 3.8  CL 105 106  CO2 22  --   GLUCOSE 100* 99  BUN 12 13  CREATININE 0.99 0.90  CALCIUM 8.3*  --    Lipid Panel:  Recent Labs  Lab 08/15/22 1530  CHOL 208*  TRIG 70  HDL 68  CHOLHDL 3.1  VLDL 14  LDLCALC 161*   HgbA1c:  Recent Labs  Lab 08/14/22 1205  HGBA1C 5.6   Urine Drug Screen:  Recent Labs  Lab 08/14/22 1130  LABOPIA NONE DETECTED  COCAINSCRNUR POSITIVE*  LABBENZ NONE DETECTED  AMPHETMU NONE DETECTED  THCU NONE DETECTED  LABBARB NONE DETECTED    Alcohol Level  Recent Labs  Lab 08/14/22 0515  ETH <10    IMAGING past 24 hours No results found.  PHYSICAL EXAM Physical Exam  Frail cachectic looking middle-aged African-American male HEENT-  [place]/AT    Lungs-patient not cooperative for exam extremities- No edema   Neurological Examination Mental Status: Awake and alert.  Speech is clear but will not speak a lot and can follow only simple midline and one-step commands Cranial Nerves: Blinks to threat on the left but not on the right. PERRL 3 mm >> 2 mm. Does not track.  Reacts to eyelid stimulation on the left but not on  the right.   Prominent right facial droop, lower quadrant  Motor/Sensory: RUE with increased tone; drops immediately to bed when elevated and released. Wd to pain. RLE mild weakness but able to lift off the bed. LUE 5/5, purposeful LLE with brisk withdrawal to pain.  Cerebellar: Unable to assess  Gait: Unable to assess    ASSESSMENT/PLAN [name] is an 67 y.o. male with a PMHx of stroke with LUE weakness, non-Hodgkin's lymphoma, s/p portacath placement followed by removal on 07/26/22, CAD s/p CABG x 4 in 2010, CHF and substance abuse who presents to the ED via EMS as a Code Stroke. LKN was 0430 when he was interacting with associates and abruptly became aphasic with right sided weakness per bystanders who called EMS. On EMS arrival they noted same. CBG 139, BP 132/77.   Acute cortical infarction left occipital lobe Punctate acute infarct left corona radiata Bilateral subdural hematomas Etiology: Left MCA distal M1 LVO s/p TICI 3 revascularization Underlying advanced intracranial atherosclerosis including chronic severe bilateral ICA stenosis.  Hx of CVA with residual LUE weakness  Code Stroke CT head  Bilateral subdural hematomas Small areas of chronic appearing white matter encephalomalacia in right frontal lobe CTA head & neck  Left MCA distal M1 ELVO CT perfusion  38 mm left MCA infarct core.  105 mL volume of left MCA penumbra Hypoperfusion index 0.6 Cerebral angio  Occluded left middle cerebral artery distal M1 segment.  TICI 3 revascularization. Post IR CT: No ICH.  MRI   Small focus of acute cortical infarcts in the left occipital lobe Punctate acute infarct in the left corona radiata Unchanged subdural hematomas, measuring up to 6 mm on the right 7 mm on the left.  2D Echo: Global severe hypokinesis with a EF of 20 to 25% left atrial size moderately dilated LDL No results found for requested labs within last 1095 days. HgbA1c 5.6 VTE prophylaxis - SCDs     Diet   Diet regular Room service appropriate? Yes; Fluid consistency: Thin   No antithrombotic prior to admission, now on Aspirin and Plavix into 3 months and then aspirin alone  therapy recommendations: SNF disposition:  pending  Acute procedure respiratory insufficiency requiring mechanical ventilation Appreciate CCM assistance with vent management Self-extubated around 1700, stable.   Hypertension Home meds:  none Cleviprex gtt, wean as able.  SBP goal 120-160 post-IR intervention Long-term BP goal normotensive  Hyperlipidemia Home meds:  none LDL 126 mg percent   Diabetes type II, no history Home meds:  none HgbA1c 5.6, goal < 7.0 CBGs Recent Labs    08/14/22 0510  GLUCAP 98    SSI  Other Stroke Risk Factors Advanced Age >/= 76  Substance abuse - UDS:  Cocaine POSITIVE.  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  TNKase not administered due to presence of bilateral chronic subdural hematomas.  Patient is doing well and will likely need rehabilitation short-term but states she would rather go home.  He has cardiomyopathy but will not start anticoagulation due to prolonged cardiac monitoring as he does not seem to be a good candidate for long-term anticoagulation.  And Plavix for 3 months to aspirin and aspirin alone for intracranial atherosclerosis.  Rober Minion out of bed.  Cardiology consulted for cardiomyopathy.  Therapy consults.  Aggressive risk factor modification.  Patient will need counseling to quit cocaine.  No family available at the bedside for  discussion.     Greater than 50% time during this 35-minute visit was spent in counseling and coordination of care about his stroke and MCA occlusion discussion for stroke prevention and answering questions.    Delia Heady, MD Medical Director [facility] Pager: [phone] 08/16/2022 1:25 PM   To contact Stroke Continuity provider, please refer to [email or web address]. After hours, contact General Neurology

## 2022-08-16 NOTE — Evaluation (Signed)
Speech Language Pathology Evaluation Patient Details Name: [name] MRN: [id number] DOB: 1956/03/13 Today's Date: 08/16/2022 Time: 1040-1056 SLP Time Calculation (min) (ACUTE ONLY): 16 min  Problem List:  Patient Active Problem List   Diagnosis Date Noted   Agitation 08/15/2022   Polysubstance abuse (HCC) 08/15/2022   Cerebral infarction due to embolism of left middle cerebral artery (HCC) 08/15/2022   Acute on chronic HFrEF (heart failure with reduced ejection fraction) (HCC) 08/15/2022   Stroke (cerebrum) (HCC) 08/14/2022   Middle cerebral artery embolism, left 08/14/2022   Past Medical History: History reviewed. No pertinent past medical history. Past Surgical History:  Past Surgical History:  Procedure Laterality Date   IR ANGIO INTRA EXTRACRAN SEL COM CAROTID INNOMINATE UNI R MOD SED  08/14/2022   IR ANGIO VERTEBRAL SEL SUBCLAVIAN INNOMINATE UNI R MOD SED  08/14/2022   IR CT HEAD LTD  08/14/2022   IR PERCUTANEOUS ART THROMBECTOMY/INFUSION INTRACRANIAL INC DIAG ANGIO  08/14/2022   RADIOLOGY WITH ANESTHESIA N/A 08/14/2022   Procedure: IR WITH ANESTHESIA;  Surgeon: Julieanne Cotton, MD;  Location: [facility] OR;  Service: Radiology;  Laterality: N/A;   HPI:  [name] is a 67 year old male who presented to ED on 5/20 via EMS with sudden onset right-sided weakness, right gaze, and aphasic. CTA positive for left MCA and distal M1 LVO.  No s/o revascularization in IR.  Remained intubated after procedue with ETT for <24 hours.  Pt with medical history significant for prior stroke with LUE weakness, non-Hodgkin's lymphoma, CAD s/p CABG x 4 in 2010, CHF, and substance abuse.   Assessment / Plan / Recommendation Clinical Impression  Pt presents with moderate-severe cognitive impairments.  Pt was assessed using the COGNISTAT (see below for additional information).  Pt's language is relatively spared.  Pt with only borderline or mild deficits on laguage tasks, which may have been 2/2  inattention.  Pt oriented to person and place, but not time.  Pt exhibited severe impiarments in memory, reasoning, calculations and problem solving tasks.  It is unclear what pt's baseline is, or how much effort he was making during today's assessment.  When asked what 5 +3, pt replied "15" suggesting he did the wrong operation.  Pt with flat affect and seemingly disengaged during assessment, but did participate.  Pt has mild dysarthria. He is largely intelligible  but with consonatn imprecision, low vocal intensity, hypernasal resonance at times, and intermittent wet vocal quality (which was noted during cognitive assessment, but not with POs).  Pt is not disagreeable to therapy, but it is unclear how actively he will participate in therapeutic tasks.  Pt feels that his performance today is consistent with his baseline, but pt's true baseline is unknown.    Recommend ST to address the above note deficits.  Pt will likely need continued intervention at next level of care.  COGNISTAT: All subtests are within the average range, except where otherwise specified.  Orientation:  7/12, Mild-moderate impairment Attention: 6/8 Comprehension: 4/6, Mild impairment Repetition: 12/12 Naming: 6/8, borderline impairment Construction: not assessed Memory: 0/12, Profound impairment Calculations: 0/4, Severe impairment Similarities: 2/8, Severe impairment Judgment: 2/6, Moderate impairment     SLP Assessment  SLP Recommendation/Assessment: Patient needs continued Speech Lanaguage Pathology Services SLP Visit Diagnosis: Cognitive communication deficit (R41.841)    Recommendations for follow up therapy are one component of a multi-disciplinary discharge planning process, led by the attending physician.  Recommendations may be updated based on patient status, additional functional criteria and insurance authorization.  Follow Up Recommendations  Follow physician's recommendations for discharge plan and  follow up therapies (Continue ST at next level of care)    Assistance Recommended at Discharge     Functional Status Assessment Patient has had a recent decline in their functional status and demonstrates the ability to make significant improvements in function in a reasonable and predictable amount of time.  Frequency and Duration min 2x/week  2 weeks      SLP Evaluation Cognition  Overall Cognitive Status: Impaired/Different from baseline (baseline unknown) Orientation Level: Oriented to person;Oriented to place;Disoriented to time Year: 2024 Month: March Day of Week: Incorrect Attention: Focused Focused Attention: Appears intact Memory: Impaired Memory Impairment: Decreased short term memory Decreased Short Term Memory: Verbal basic Awareness: Impaired Problem Solving: Impaired Problem Solving Impairment: Verbal basic Executive Function: Reasoning Reasoning: Impaired       Comprehension  Auditory Comprehension Overall Auditory Comprehension: Impaired Commands: Impaired Two Step Basic Commands: 50-74% accurate Multistep Basic Commands: 0-24% accurate Conversation: Simple Visual Recognition/Discrimination Discrimination: Not tested Reading Comprehension Reading Status: Not tested    Expression Expression Primary Mode of Expression: Verbal Verbal Expression Overall Verbal Expression: Appears within functional limits for tasks assessed Level of Generative/Spontaneous Verbalization: Sentence Repetition: No impairment Naming: No impairment Pragmatics: Impairment Impairments: Eye contact;Monotone   Oral / Motor  Oral Motor/Sensory Function Overall Oral Motor/Sensory Function: Mild impairment Facial Symmetry: Abnormal symmetry right Motor Speech Overall Motor Speech: Impaired Respiration: Within functional limits Phonation: Aphonic Resonance: Hypernasality Articulation: Impaired Level of Impairment: Sentence Intelligibility: Intelligible Motor Planning: Witnin  functional limits            Kerrie Pleasure , MA, CCC-SLP Acute Rehabilitation Services Office: [phone] 08/16/2022, 11:22 AM

## 2022-08-16 NOTE — TOC Progression Note (Signed)
Transition of Care [facility]) - Progression Note    Patient Details  Name: MACHI VANPATTEN MRN: [id number] Date of Birth: [birth year]  [facility]) CM/SW Contact  Baldemar Lenis, [place] Phone Number: 08/16/2022, 2:23 PM  Clinical Narrative:   CSW met with patient to discuss recommendation for SNF placement. Patient saying he's been to SNF before and he won't go back, he wants to go home. CSW asked about help at home, and patient said his niece, Duwayne Heck, would stay with him or he has other friends that can help him out. Patient has yet to ambulate with therapies. CSW told patient that he needed to show that he can walk in order to discuss discharge home. CSW sent message to PT assigned today. CSW to follow.    Expected Discharge Plan: Home w Home Health Services Barriers to Discharge: Continued Medical Work up  Expected Discharge Plan and Services     Post Acute Care Choice: NA Living arrangements for the past 2 months: Single Family Home                                       Social Determinants of Health (SDOH) Interventions    Readmission Risk Interventions     No data to display

## 2022-08-17 ENCOUNTER — Other Ambulatory Visit (HOSPITAL_COMMUNITY): Payer: Self-pay

## 2022-08-17 ENCOUNTER — Telehealth (HOSPITAL_COMMUNITY): Payer: Self-pay | Admitting: Pharmacy Technician

## 2022-08-17 MED ORDER — ASPIRIN 81 MG PO TBEC: 81.0000 mg | DELAYED_RELEASE_TABLET | Freq: Every day | ORAL | 12 refills | Status: AC

## 2022-08-17 MED ORDER — CLOPIDOGREL BISULFATE 75 MG PO TABS: 75.0000 mg | ORAL_TABLET | Freq: Every day | ORAL | 0 refills | Status: AC

## 2022-08-17 MED ORDER — ATORVASTATIN CALCIUM 80 MG PO TABS
80.0000 mg | ORAL_TABLET | Freq: Every day | ORAL | Status: DC
  Administered 2022-08-17: 80 mg via ORAL
  Filled 2022-08-17: qty 1

## 2022-08-17 MED ORDER — ATORVASTATIN CALCIUM 80 MG PO TABS: 80.0000 mg | ORAL_TABLET | Freq: Every day | ORAL | 1 refills | Status: AC

## 2022-08-17 MED ORDER — CLOPIDOGREL BISULFATE 75 MG PO TABS
75.0000 mg | ORAL_TABLET | Freq: Every day | ORAL | Status: DC
  Administered 2022-08-17: 75 mg via ORAL
  Filled 2022-08-17: qty 1

## 2022-08-17 NOTE — TOC Transition Note (Addendum)
Transition of Care [facility]) - CM/SW Discharge Note   Patient Details  Name: [name] MRN: [id number] Date of Birth: [birth year]  [facility]) CM/SW Contact:  Lockie Pares, RN Phone Number: 08/17/2022, 10:59 AM   Clinical Narrative:    Spoke to [name].  Introduced self and explained role. He is amenable to Home health, declines any need for DME. Address correct on chart. Called [facility], cannot take at this time, messaged with Frances [facility] and accepted for home health.   Final next level of care: Home w Home Health Services Barriers to Discharge: No Barriers Identified   Patient Goals and CMS Choice CMS Medicare.gov Compare Post Acute Care list provided to:: Patient Choice offered to / list presented to : Patient  Discharge Placement                         Discharge Plan and Services Additional resources added to the After Visit Summary for       Post Acute Care Choice: NA                    HH Arranged: PT, OT HH Agency: [facility] Date [facility] Agency Contacted: 08/17/22 Time HH Agency Contacted: 1039 Representative spoke with at [facility] Agency: Angie  Social Determinants of Health (SDOH) Interventions     Readmission Risk Interventions     No data to display

## 2022-08-17 NOTE — Discharge Summary (Addendum)
Stroke Discharge Summary  Patient ID: [name]   MRN: [id number]      DOB: [birth year]  Date of Admission: 08/14/2022 Date of Discharge: 08/17/2022  Attending Physician:  Caryl Pina, MD, Stroke MD Consultant(s):    cardiology  Patient's PCP:  Pcp, No  DISCHARGE DIAGNOSIS:  Principal Problem:  Acute cortical infarction left occipital lobe Punctate acute infarct left corona radiata Bilateral subdural hematomas Etiology: Left MCA distal M1 LVO s/p TICI 3 revascularization Underlying advanced intracranial atherosclerosis including chronic severe bilateral ICA stenosis. Active Problems:   Middle cerebral artery embolism, left   Agitation   Polysubstance abuse (HCC)   Cerebral infarction due to embolism of left middle cerebral artery (HCC)   Acute on chronic HFrEF (heart failure with reduced ejection fraction) (HCC)  Allergies as of 08/17/2022   Not on File      Medication List     TAKE these medications    aspirin EC 81 MG tablet Take 1 tablet (81 mg total) by mouth daily. Swallow whole. Start taking on: Aug 18, 2022   atorvastatin 80 MG tablet Commonly known as: LIPITOR Take 1 tablet (80 mg total) by mouth at bedtime.   clopidogrel 75 MG tablet Commonly known as: PLAVIX Take 1 tablet (75 mg total) by mouth daily.       LABORATORY STUDIES CBC    Component Value Date/Time   WBC 3.7 (L) 08/14/2022 0515   RBC 4.03 (L) 08/14/2022 0515   HGB 13.6 08/14/2022 0517   HCT 40.0 08/14/2022 0517   PLT 207 08/14/2022 0515   MCV 99.3 08/14/2022 0515   MCH 33.0 08/14/2022 0515   MCHC 33.3 08/14/2022 0515   RDW 13.6 08/14/2022 0515   LYMPHSABS 1.5 08/14/2022 0515   MONOABS 0.3 08/14/2022 0515   EOSABS 0.1 08/14/2022 0515   BASOSABS 0.0 08/14/2022 0515   CMP    Component Value Date/Time   NA 139 08/14/2022 0517   K 3.8 08/14/2022 0517   CL 106 08/14/2022 0517   CO2 22 08/14/2022 0515   GLUCOSE 99 08/14/2022 0517   BUN 13 08/14/2022 0517   CREATININE  0.90 08/14/2022 0517   CALCIUM 8.3 (L) 08/14/2022 0515   PROT 6.1 (L) 08/14/2022 0515   ALBUMIN 3.2 (L) 08/14/2022 0515   AST 16 08/14/2022 0515   ALT 14 08/14/2022 0515   ALKPHOS 54 08/14/2022 0515   BILITOT 0.7 08/14/2022 0515   GFRNONAA >60 08/14/2022 0515   COAGS Lab Results  Component Value Date   INR 1.1 08/14/2022   Lipid Panel    Component Value Date/Time   CHOL 208 (H) 08/15/2022 1530   TRIG 70 08/15/2022 1530   HDL 68 08/15/2022 1530   CHOLHDL 3.1 08/15/2022 1530   VLDL 14 08/15/2022 1530   LDLCALC 126 (H) 08/15/2022 1530   HgbA1C  Lab Results  Component Value Date   HGBA1C 5.6 08/14/2022   Urinalysis No results found for: "COLORURINE", "APPEARANCEUR", "LABSPEC", "PHURINE", "GLUCOSEU", "HGBUR", "BILIRUBINUR", "KETONESUR", "PROTEINUR", "UROBILINOGEN", "NITRITE", "LEUKOCYTESUR" Urine Drug Screen     Component Value Date/Time   LABOPIA NONE DETECTED 08/14/2022 1130   COCAINSCRNUR POSITIVE (A) 08/14/2022 1130   LABBENZ NONE DETECTED 08/14/2022 1130   AMPHETMU NONE DETECTED 08/14/2022 1130   THCU NONE DETECTED 08/14/2022 1130   LABBARB NONE DETECTED 08/14/2022 1130    Alcohol Level    Component Value Date/Time   ETH <10 08/14/2022 0515   SIGNIFICANT DIAGNOSTIC STUDIES IR PERCUTANEOUS ART THROMBECTOMY/INFUSION INTRACRANIAL INC  Stroke Discharge Summary  Patient ID: [name]   MRN: [id number]      DOB: [birth year]  Date of Admission: 08/14/2022 Date of Discharge: 08/17/2022  Attending Physician:  Caryl Pina, MD, Stroke MD Consultant(s):    cardiology  Patient's PCP:  Pcp, No  DISCHARGE DIAGNOSIS:  Principal Problem:  Acute cortical infarction left occipital lobe Punctate acute infarct left corona radiata Bilateral subdural hematomas Etiology: Left MCA distal M1 LVO s/p TICI 3 revascularization Underlying advanced intracranial atherosclerosis including chronic severe bilateral ICA stenosis. Active Problems:   Middle cerebral artery embolism, left   Agitation   Polysubstance abuse (HCC)   Cerebral infarction due to embolism of left middle cerebral artery (HCC)   Acute on chronic HFrEF (heart failure with reduced ejection fraction) (HCC)  Allergies as of 08/17/2022   Not on File      Medication List     TAKE these medications    aspirin EC 81 MG tablet Take 1 tablet (81 mg total) by mouth daily. Swallow whole. Start taking on: Aug 18, 2022   atorvastatin 80 MG tablet Commonly known as: LIPITOR Take 1 tablet (80 mg total) by mouth at bedtime.   clopidogrel 75 MG tablet Commonly known as: PLAVIX Take 1 tablet (75 mg total) by mouth daily.       LABORATORY STUDIES CBC    Component Value Date/Time   WBC 3.7 (L) 08/14/2022 0515   RBC 4.03 (L) 08/14/2022 0515   HGB 13.6 08/14/2022 0517   HCT 40.0 08/14/2022 0517   PLT 207 08/14/2022 0515   MCV 99.3 08/14/2022 0515   MCH 33.0 08/14/2022 0515   MCHC 33.3 08/14/2022 0515   RDW 13.6 08/14/2022 0515   LYMPHSABS 1.5 08/14/2022 0515   MONOABS 0.3 08/14/2022 0515   EOSABS 0.1 08/14/2022 0515   BASOSABS 0.0 08/14/2022 0515   CMP    Component Value Date/Time   NA 139 08/14/2022 0517   K 3.8 08/14/2022 0517   CL 106 08/14/2022 0517   CO2 22 08/14/2022 0515   GLUCOSE 99 08/14/2022 0517   BUN 13 08/14/2022 0517   CREATININE  0.90 08/14/2022 0517   CALCIUM 8.3 (L) 08/14/2022 0515   PROT 6.1 (L) 08/14/2022 0515   ALBUMIN 3.2 (L) 08/14/2022 0515   AST 16 08/14/2022 0515   ALT 14 08/14/2022 0515   ALKPHOS 54 08/14/2022 0515   BILITOT 0.7 08/14/2022 0515   GFRNONAA >60 08/14/2022 0515   COAGS Lab Results  Component Value Date   INR 1.1 08/14/2022   Lipid Panel    Component Value Date/Time   CHOL 208 (H) 08/15/2022 1530   TRIG 70 08/15/2022 1530   HDL 68 08/15/2022 1530   CHOLHDL 3.1 08/15/2022 1530   VLDL 14 08/15/2022 1530   LDLCALC 126 (H) 08/15/2022 1530   HgbA1C  Lab Results  Component Value Date   HGBA1C 5.6 08/14/2022   Urinalysis No results found for: "COLORURINE", "APPEARANCEUR", "LABSPEC", "PHURINE", "GLUCOSEU", "HGBUR", "BILIRUBINUR", "KETONESUR", "PROTEINUR", "UROBILINOGEN", "NITRITE", "LEUKOCYTESUR" Urine Drug Screen     Component Value Date/Time   LABOPIA NONE DETECTED 08/14/2022 1130   COCAINSCRNUR POSITIVE (A) 08/14/2022 1130   LABBENZ NONE DETECTED 08/14/2022 1130   AMPHETMU NONE DETECTED 08/14/2022 1130   THCU NONE DETECTED 08/14/2022 1130   LABBARB NONE DETECTED 08/14/2022 1130    Alcohol Level    Component Value Date/Time   ETH <10 08/14/2022 0515   SIGNIFICANT DIAGNOSTIC STUDIES IR PERCUTANEOUS ART THROMBECTOMY/INFUSION INTRACRANIAL INC  capillary and venous phases. The right vertebral artery origin is widely patent. The vessel is seen to opacify to the cranial skull base. Patency is seen of the right vertebrobasilar junction and the right posterior-inferior cerebellar artery. The basilar artery, the posterior cerebral arteries, the superior cerebellar arteries and the anterior-inferior cerebellar arteries demonstrate patency into the capillary and venous phases. Manual compression with quick clot was performed for hemostasis at the right groin puncture site. Distal pulses continued to demonstrate Dopplerable bilateral PT, and now Dopplerable left DP. A flat panel CT of the brain  demonstrated no evidence of intracranial hemorrhage. Patient was left intubated to protect her airway and unresponsiveness prior to intubation. She was then transferred to the neuro ICU for post revascularization care. IMPRESSION: Status post complete revascularization of occluded left middle cerebral artery with 1 pass with the 3 mm x 40 mm Solitaire X retrieval device, and contact aspiration achieving a TICI 3 revascularization. Moderate sized left carotid bulb WEB. PLAN: Follow-up as per referring MD. Electronically Signed   By: Julieanne Cotton M.D.   On: 08/16/2022 06:08   ECHOCARDIOGRAM COMPLETE  Result Date: 08/14/2022    ECHOCARDIOGRAM REPORT   Patient Name:   [name] Date of Exam: 08/14/2022 Medical Rec #:  [id number]       Height:       66.0 in Accession #:    [id number]      Weight:       110.0 lb Date of Birth:  1955-08-08       BSA:          1.551 m Patient Age:    66 years        BP:           141/91 mmHg Patient Gender: M               HR:           70 bpm. Exam Location:  Inpatient Procedure: 2D Echo, 3D Echo, Cardiac Doppler, Color Doppler and Strain Analysis Indications:    Stroke I63.9  History:        Patient has prior history of Echocardiogram examinations, most                 recent 03/02/2021. CAD, Prior CABG; Stroke. 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  Sonographer:    Dondra Prader RVT RCS Referring Phys: ERIC LINDZEN IMPRESSIONS  1. Global severe hypokinesis with akinesis of apex. Left ventricular ejection fraction, by estimation, is 20 to 25%. The left ventricle has severely decreased function. The left ventricle demonstrates global hypokinesis. The left ventricular internal cavity size was mildly dilated. Left ventricular diastolic parameters are indeterminate. The average left ventricular global longitudinal strain is -9.4 %. The global longitudinal  strain is abnormal.  2. Right ventricular systolic function is normal. The right ventricular size is normal.  3. Left atrial size was moderately dilated.  4. Right atrial size was mildly dilated.  5. The mitral valve is grossly normal. Trivial mitral valve regurgitation. No evidence of mitral stenosis.  6. The aortic valve is grossly normal. Aortic valve regurgitation is not visualized. No aortic stenosis is present.  7. The inferior vena cava is dilated in  Stroke Discharge Summary  Patient ID: [name]   MRN: [id number]      DOB: [birth year]  Date of Admission: 08/14/2022 Date of Discharge: 08/17/2022  Attending Physician:  Caryl Pina, MD, Stroke MD Consultant(s):    cardiology  Patient's PCP:  Pcp, No  DISCHARGE DIAGNOSIS:  Principal Problem:  Acute cortical infarction left occipital lobe Punctate acute infarct left corona radiata Bilateral subdural hematomas Etiology: Left MCA distal M1 LVO s/p TICI 3 revascularization Underlying advanced intracranial atherosclerosis including chronic severe bilateral ICA stenosis. Active Problems:   Middle cerebral artery embolism, left   Agitation   Polysubstance abuse (HCC)   Cerebral infarction due to embolism of left middle cerebral artery (HCC)   Acute on chronic HFrEF (heart failure with reduced ejection fraction) (HCC)  Allergies as of 08/17/2022   Not on File      Medication List     TAKE these medications    aspirin EC 81 MG tablet Take 1 tablet (81 mg total) by mouth daily. Swallow whole. Start taking on: Aug 18, 2022   atorvastatin 80 MG tablet Commonly known as: LIPITOR Take 1 tablet (80 mg total) by mouth at bedtime.   clopidogrel 75 MG tablet Commonly known as: PLAVIX Take 1 tablet (75 mg total) by mouth daily.       LABORATORY STUDIES CBC    Component Value Date/Time   WBC 3.7 (L) 08/14/2022 0515   RBC 4.03 (L) 08/14/2022 0515   HGB 13.6 08/14/2022 0517   HCT 40.0 08/14/2022 0517   PLT 207 08/14/2022 0515   MCV 99.3 08/14/2022 0515   MCH 33.0 08/14/2022 0515   MCHC 33.3 08/14/2022 0515   RDW 13.6 08/14/2022 0515   LYMPHSABS 1.5 08/14/2022 0515   MONOABS 0.3 08/14/2022 0515   EOSABS 0.1 08/14/2022 0515   BASOSABS 0.0 08/14/2022 0515   CMP    Component Value Date/Time   NA 139 08/14/2022 0517   K 3.8 08/14/2022 0517   CL 106 08/14/2022 0517   CO2 22 08/14/2022 0515   GLUCOSE 99 08/14/2022 0517   BUN 13 08/14/2022 0517   CREATININE  0.90 08/14/2022 0517   CALCIUM 8.3 (L) 08/14/2022 0515   PROT 6.1 (L) 08/14/2022 0515   ALBUMIN 3.2 (L) 08/14/2022 0515   AST 16 08/14/2022 0515   ALT 14 08/14/2022 0515   ALKPHOS 54 08/14/2022 0515   BILITOT 0.7 08/14/2022 0515   GFRNONAA >60 08/14/2022 0515   COAGS Lab Results  Component Value Date   INR 1.1 08/14/2022   Lipid Panel    Component Value Date/Time   CHOL 208 (H) 08/15/2022 1530   TRIG 70 08/15/2022 1530   HDL 68 08/15/2022 1530   CHOLHDL 3.1 08/15/2022 1530   VLDL 14 08/15/2022 1530   LDLCALC 126 (H) 08/15/2022 1530   HgbA1C  Lab Results  Component Value Date   HGBA1C 5.6 08/14/2022   Urinalysis No results found for: "COLORURINE", "APPEARANCEUR", "LABSPEC", "PHURINE", "GLUCOSEU", "HGBUR", "BILIRUBINUR", "KETONESUR", "PROTEINUR", "UROBILINOGEN", "NITRITE", "LEUKOCYTESUR" Urine Drug Screen     Component Value Date/Time   LABOPIA NONE DETECTED 08/14/2022 1130   COCAINSCRNUR POSITIVE (A) 08/14/2022 1130   LABBENZ NONE DETECTED 08/14/2022 1130   AMPHETMU NONE DETECTED 08/14/2022 1130   THCU NONE DETECTED 08/14/2022 1130   LABBARB NONE DETECTED 08/14/2022 1130    Alcohol Level    Component Value Date/Time   ETH <10 08/14/2022 0515   SIGNIFICANT DIAGNOSTIC STUDIES IR PERCUTANEOUS ART THROMBECTOMY/INFUSION INTRACRANIAL INC  Stroke Discharge Summary  Patient ID: [name]   MRN: [id number]      DOB: [birth year]  Date of Admission: 08/14/2022 Date of Discharge: 08/17/2022  Attending Physician:  Caryl Pina, MD, Stroke MD Consultant(s):    cardiology  Patient's PCP:  Pcp, No  DISCHARGE DIAGNOSIS:  Principal Problem:  Acute cortical infarction left occipital lobe Punctate acute infarct left corona radiata Bilateral subdural hematomas Etiology: Left MCA distal M1 LVO s/p TICI 3 revascularization Underlying advanced intracranial atherosclerosis including chronic severe bilateral ICA stenosis. Active Problems:   Middle cerebral artery embolism, left   Agitation   Polysubstance abuse (HCC)   Cerebral infarction due to embolism of left middle cerebral artery (HCC)   Acute on chronic HFrEF (heart failure with reduced ejection fraction) (HCC)  Allergies as of 08/17/2022   Not on File      Medication List     TAKE these medications    aspirin EC 81 MG tablet Take 1 tablet (81 mg total) by mouth daily. Swallow whole. Start taking on: Aug 18, 2022   atorvastatin 80 MG tablet Commonly known as: LIPITOR Take 1 tablet (80 mg total) by mouth at bedtime.   clopidogrel 75 MG tablet Commonly known as: PLAVIX Take 1 tablet (75 mg total) by mouth daily.       LABORATORY STUDIES CBC    Component Value Date/Time   WBC 3.7 (L) 08/14/2022 0515   RBC 4.03 (L) 08/14/2022 0515   HGB 13.6 08/14/2022 0517   HCT 40.0 08/14/2022 0517   PLT 207 08/14/2022 0515   MCV 99.3 08/14/2022 0515   MCH 33.0 08/14/2022 0515   MCHC 33.3 08/14/2022 0515   RDW 13.6 08/14/2022 0515   LYMPHSABS 1.5 08/14/2022 0515   MONOABS 0.3 08/14/2022 0515   EOSABS 0.1 08/14/2022 0515   BASOSABS 0.0 08/14/2022 0515   CMP    Component Value Date/Time   NA 139 08/14/2022 0517   K 3.8 08/14/2022 0517   CL 106 08/14/2022 0517   CO2 22 08/14/2022 0515   GLUCOSE 99 08/14/2022 0517   BUN 13 08/14/2022 0517   CREATININE  0.90 08/14/2022 0517   CALCIUM 8.3 (L) 08/14/2022 0515   PROT 6.1 (L) 08/14/2022 0515   ALBUMIN 3.2 (L) 08/14/2022 0515   AST 16 08/14/2022 0515   ALT 14 08/14/2022 0515   ALKPHOS 54 08/14/2022 0515   BILITOT 0.7 08/14/2022 0515   GFRNONAA >60 08/14/2022 0515   COAGS Lab Results  Component Value Date   INR 1.1 08/14/2022   Lipid Panel    Component Value Date/Time   CHOL 208 (H) 08/15/2022 1530   TRIG 70 08/15/2022 1530   HDL 68 08/15/2022 1530   CHOLHDL 3.1 08/15/2022 1530   VLDL 14 08/15/2022 1530   LDLCALC 126 (H) 08/15/2022 1530   HgbA1C  Lab Results  Component Value Date   HGBA1C 5.6 08/14/2022   Urinalysis No results found for: "COLORURINE", "APPEARANCEUR", "LABSPEC", "PHURINE", "GLUCOSEU", "HGBUR", "BILIRUBINUR", "KETONESUR", "PROTEINUR", "UROBILINOGEN", "NITRITE", "LEUKOCYTESUR" Urine Drug Screen     Component Value Date/Time   LABOPIA NONE DETECTED 08/14/2022 1130   COCAINSCRNUR POSITIVE (A) 08/14/2022 1130   LABBENZ NONE DETECTED 08/14/2022 1130   AMPHETMU NONE DETECTED 08/14/2022 1130   THCU NONE DETECTED 08/14/2022 1130   LABBARB NONE DETECTED 08/14/2022 1130    Alcohol Level    Component Value Date/Time   ETH <10 08/14/2022 0515   SIGNIFICANT DIAGNOSTIC STUDIES IR PERCUTANEOUS ART THROMBECTOMY/INFUSION INTRACRANIAL INC  Stroke Discharge Summary  Patient ID: [name]   MRN: [id number]      DOB: [birth year]  Date of Admission: 08/14/2022 Date of Discharge: 08/17/2022  Attending Physician:  Caryl Pina, MD, Stroke MD Consultant(s):    cardiology  Patient's PCP:  Pcp, No  DISCHARGE DIAGNOSIS:  Principal Problem:  Acute cortical infarction left occipital lobe Punctate acute infarct left corona radiata Bilateral subdural hematomas Etiology: Left MCA distal M1 LVO s/p TICI 3 revascularization Underlying advanced intracranial atherosclerosis including chronic severe bilateral ICA stenosis. Active Problems:   Middle cerebral artery embolism, left   Agitation   Polysubstance abuse (HCC)   Cerebral infarction due to embolism of left middle cerebral artery (HCC)   Acute on chronic HFrEF (heart failure with reduced ejection fraction) (HCC)  Allergies as of 08/17/2022   Not on File      Medication List     TAKE these medications    aspirin EC 81 MG tablet Take 1 tablet (81 mg total) by mouth daily. Swallow whole. Start taking on: Aug 18, 2022   atorvastatin 80 MG tablet Commonly known as: LIPITOR Take 1 tablet (80 mg total) by mouth at bedtime.   clopidogrel 75 MG tablet Commonly known as: PLAVIX Take 1 tablet (75 mg total) by mouth daily.       LABORATORY STUDIES CBC    Component Value Date/Time   WBC 3.7 (L) 08/14/2022 0515   RBC 4.03 (L) 08/14/2022 0515   HGB 13.6 08/14/2022 0517   HCT 40.0 08/14/2022 0517   PLT 207 08/14/2022 0515   MCV 99.3 08/14/2022 0515   MCH 33.0 08/14/2022 0515   MCHC 33.3 08/14/2022 0515   RDW 13.6 08/14/2022 0515   LYMPHSABS 1.5 08/14/2022 0515   MONOABS 0.3 08/14/2022 0515   EOSABS 0.1 08/14/2022 0515   BASOSABS 0.0 08/14/2022 0515   CMP    Component Value Date/Time   NA 139 08/14/2022 0517   K 3.8 08/14/2022 0517   CL 106 08/14/2022 0517   CO2 22 08/14/2022 0515   GLUCOSE 99 08/14/2022 0517   BUN 13 08/14/2022 0517   CREATININE  0.90 08/14/2022 0517   CALCIUM 8.3 (L) 08/14/2022 0515   PROT 6.1 (L) 08/14/2022 0515   ALBUMIN 3.2 (L) 08/14/2022 0515   AST 16 08/14/2022 0515   ALT 14 08/14/2022 0515   ALKPHOS 54 08/14/2022 0515   BILITOT 0.7 08/14/2022 0515   GFRNONAA >60 08/14/2022 0515   COAGS Lab Results  Component Value Date   INR 1.1 08/14/2022   Lipid Panel    Component Value Date/Time   CHOL 208 (H) 08/15/2022 1530   TRIG 70 08/15/2022 1530   HDL 68 08/15/2022 1530   CHOLHDL 3.1 08/15/2022 1530   VLDL 14 08/15/2022 1530   LDLCALC 126 (H) 08/15/2022 1530   HgbA1C  Lab Results  Component Value Date   HGBA1C 5.6 08/14/2022   Urinalysis No results found for: "COLORURINE", "APPEARANCEUR", "LABSPEC", "PHURINE", "GLUCOSEU", "HGBUR", "BILIRUBINUR", "KETONESUR", "PROTEINUR", "UROBILINOGEN", "NITRITE", "LEUKOCYTESUR" Urine Drug Screen     Component Value Date/Time   LABOPIA NONE DETECTED 08/14/2022 1130   COCAINSCRNUR POSITIVE (A) 08/14/2022 1130   LABBENZ NONE DETECTED 08/14/2022 1130   AMPHETMU NONE DETECTED 08/14/2022 1130   THCU NONE DETECTED 08/14/2022 1130   LABBARB NONE DETECTED 08/14/2022 1130    Alcohol Level    Component Value Date/Time   ETH <10 08/14/2022 0515   SIGNIFICANT DIAGNOSTIC STUDIES IR PERCUTANEOUS ART THROMBECTOMY/INFUSION INTRACRANIAL INC  capillary and venous phases. The right vertebral artery origin is widely patent. The vessel is seen to opacify to the cranial skull base. Patency is seen of the right vertebrobasilar junction and the right posterior-inferior cerebellar artery. The basilar artery, the posterior cerebral arteries, the superior cerebellar arteries and the anterior-inferior cerebellar arteries demonstrate patency into the capillary and venous phases. Manual compression with quick clot was performed for hemostasis at the right groin puncture site. Distal pulses continued to demonstrate Dopplerable bilateral PT, and now Dopplerable left DP. A flat panel CT of the brain  demonstrated no evidence of intracranial hemorrhage. Patient was left intubated to protect her airway and unresponsiveness prior to intubation. She was then transferred to the neuro ICU for post revascularization care. IMPRESSION: Status post complete revascularization of occluded left middle cerebral artery with 1 pass with the 3 mm x 40 mm Solitaire X retrieval device, and contact aspiration achieving a TICI 3 revascularization. Moderate sized left carotid bulb WEB. PLAN: Follow-up as per referring MD. Electronically Signed   By: Julieanne Cotton M.D.   On: 08/16/2022 06:08   ECHOCARDIOGRAM COMPLETE  Result Date: 08/14/2022    ECHOCARDIOGRAM REPORT   Patient Name:   [name] Date of Exam: 08/14/2022 Medical Rec #:  [id number]       Height:       66.0 in Accession #:    [id number]      Weight:       110.0 lb Date of Birth:  1955-08-08       BSA:          1.551 m Patient Age:    66 years        BP:           141/91 mmHg Patient Gender: M               HR:           70 bpm. Exam Location:  Inpatient Procedure: 2D Echo, 3D Echo, Cardiac Doppler, Color Doppler and Strain Analysis Indications:    Stroke I63.9  History:        Patient has prior history of Echocardiogram examinations, most                 recent 03/02/2021. CAD, Prior CABG; Stroke. 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  Sonographer:    Dondra Prader RVT RCS Referring Phys: ERIC LINDZEN IMPRESSIONS  1. Global severe hypokinesis with akinesis of apex. Left ventricular ejection fraction, by estimation, is 20 to 25%. The left ventricle has severely decreased function. The left ventricle demonstrates global hypokinesis. The left ventricular internal cavity size was mildly dilated. Left ventricular diastolic parameters are indeterminate. The average left ventricular global longitudinal strain is -9.4 %. The global longitudinal  strain is abnormal.  2. Right ventricular systolic function is normal. The right ventricular size is normal.  3. Left atrial size was moderately dilated.  4. Right atrial size was mildly dilated.  5. The mitral valve is grossly normal. Trivial mitral valve regurgitation. No evidence of mitral stenosis.  6. The aortic valve is grossly normal. Aortic valve regurgitation is not visualized. No aortic stenosis is present.  7. The inferior vena cava is dilated in  capillary and venous phases. The right vertebral artery origin is widely patent. The vessel is seen to opacify to the cranial skull base. Patency is seen of the right vertebrobasilar junction and the right posterior-inferior cerebellar artery. The basilar artery, the posterior cerebral arteries, the superior cerebellar arteries and the anterior-inferior cerebellar arteries demonstrate patency into the capillary and venous phases. Manual compression with quick clot was performed for hemostasis at the right groin puncture site. Distal pulses continued to demonstrate Dopplerable bilateral PT, and now Dopplerable left DP. A flat panel CT of the brain  demonstrated no evidence of intracranial hemorrhage. Patient was left intubated to protect her airway and unresponsiveness prior to intubation. She was then transferred to the neuro ICU for post revascularization care. IMPRESSION: Status post complete revascularization of occluded left middle cerebral artery with 1 pass with the 3 mm x 40 mm Solitaire X retrieval device, and contact aspiration achieving a TICI 3 revascularization. Moderate sized left carotid bulb WEB. PLAN: Follow-up as per referring MD. Electronically Signed   By: Julieanne Cotton M.D.   On: 08/16/2022 06:08   ECHOCARDIOGRAM COMPLETE  Result Date: 08/14/2022    ECHOCARDIOGRAM REPORT   Patient Name:   [name] Date of Exam: 08/14/2022 Medical Rec #:  [id number]       Height:       66.0 in Accession #:    [id number]      Weight:       110.0 lb Date of Birth:  1955-08-08       BSA:          1.551 m Patient Age:    66 years        BP:           141/91 mmHg Patient Gender: M               HR:           70 bpm. Exam Location:  Inpatient Procedure: 2D Echo, 3D Echo, Cardiac Doppler, Color Doppler and Strain Analysis Indications:    Stroke I63.9  History:        Patient has prior history of Echocardiogram examinations, most                 recent 03/02/2021. CAD, Prior CABG; Stroke. 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  Sonographer:    Dondra Prader RVT RCS Referring Phys: ERIC LINDZEN IMPRESSIONS  1. Global severe hypokinesis with akinesis of apex. Left ventricular ejection fraction, by estimation, is 20 to 25%. The left ventricle has severely decreased function. The left ventricle demonstrates global hypokinesis. The left ventricular internal cavity size was mildly dilated. Left ventricular diastolic parameters are indeterminate. The average left ventricular global longitudinal strain is -9.4 %. The global longitudinal  strain is abnormal.  2. Right ventricular systolic function is normal. The right ventricular size is normal.  3. Left atrial size was moderately dilated.  4. Right atrial size was mildly dilated.  5. The mitral valve is grossly normal. Trivial mitral valve regurgitation. No evidence of mitral stenosis.  6. The aortic valve is grossly normal. Aortic valve regurgitation is not visualized. No aortic stenosis is present.  7. The inferior vena cava is dilated in  capillary and venous phases. The right vertebral artery origin is widely patent. The vessel is seen to opacify to the cranial skull base. Patency is seen of the right vertebrobasilar junction and the right posterior-inferior cerebellar artery. The basilar artery, the posterior cerebral arteries, the superior cerebellar arteries and the anterior-inferior cerebellar arteries demonstrate patency into the capillary and venous phases. Manual compression with quick clot was performed for hemostasis at the right groin puncture site. Distal pulses continued to demonstrate Dopplerable bilateral PT, and now Dopplerable left DP. A flat panel CT of the brain  demonstrated no evidence of intracranial hemorrhage. Patient was left intubated to protect her airway and unresponsiveness prior to intubation. She was then transferred to the neuro ICU for post revascularization care. IMPRESSION: Status post complete revascularization of occluded left middle cerebral artery with 1 pass with the 3 mm x 40 mm Solitaire X retrieval device, and contact aspiration achieving a TICI 3 revascularization. Moderate sized left carotid bulb WEB. PLAN: Follow-up as per referring MD. Electronically Signed   By: Julieanne Cotton M.D.   On: 08/16/2022 06:08   ECHOCARDIOGRAM COMPLETE  Result Date: 08/14/2022    ECHOCARDIOGRAM REPORT   Patient Name:   [name] Date of Exam: 08/14/2022 Medical Rec #:  [id number]       Height:       66.0 in Accession #:    [id number]      Weight:       110.0 lb Date of Birth:  1955-08-08       BSA:          1.551 m Patient Age:    66 years        BP:           141/91 mmHg Patient Gender: M               HR:           70 bpm. Exam Location:  Inpatient Procedure: 2D Echo, 3D Echo, Cardiac Doppler, Color Doppler and Strain Analysis Indications:    Stroke I63.9  History:        Patient has prior history of Echocardiogram examinations, most                 recent 03/02/2021. CAD, Prior CABG; Stroke. 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  Sonographer:    Dondra Prader RVT RCS Referring Phys: ERIC LINDZEN IMPRESSIONS  1. Global severe hypokinesis with akinesis of apex. Left ventricular ejection fraction, by estimation, is 20 to 25%. The left ventricle has severely decreased function. The left ventricle demonstrates global hypokinesis. The left ventricular internal cavity size was mildly dilated. Left ventricular diastolic parameters are indeterminate. The average left ventricular global longitudinal strain is -9.4 %. The global longitudinal  strain is abnormal.  2. Right ventricular systolic function is normal. The right ventricular size is normal.  3. Left atrial size was moderately dilated.  4. Right atrial size was mildly dilated.  5. The mitral valve is grossly normal. Trivial mitral valve regurgitation. No evidence of mitral stenosis.  6. The aortic valve is grossly normal. Aortic valve regurgitation is not visualized. No aortic stenosis is present.  7. The inferior vena cava is dilated in  Stroke Discharge Summary  Patient ID: [name]   MRN: [id number]      DOB: [birth year]  Date of Admission: 08/14/2022 Date of Discharge: 08/17/2022  Attending Physician:  Caryl Pina, MD, Stroke MD Consultant(s):    cardiology  Patient's PCP:  Pcp, No  DISCHARGE DIAGNOSIS:  Principal Problem:  Acute cortical infarction left occipital lobe Punctate acute infarct left corona radiata Bilateral subdural hematomas Etiology: Left MCA distal M1 LVO s/p TICI 3 revascularization Underlying advanced intracranial atherosclerosis including chronic severe bilateral ICA stenosis. Active Problems:   Middle cerebral artery embolism, left   Agitation   Polysubstance abuse (HCC)   Cerebral infarction due to embolism of left middle cerebral artery (HCC)   Acute on chronic HFrEF (heart failure with reduced ejection fraction) (HCC)  Allergies as of 08/17/2022   Not on File      Medication List     TAKE these medications    aspirin EC 81 MG tablet Take 1 tablet (81 mg total) by mouth daily. Swallow whole. Start taking on: Aug 18, 2022   atorvastatin 80 MG tablet Commonly known as: LIPITOR Take 1 tablet (80 mg total) by mouth at bedtime.   clopidogrel 75 MG tablet Commonly known as: PLAVIX Take 1 tablet (75 mg total) by mouth daily.       LABORATORY STUDIES CBC    Component Value Date/Time   WBC 3.7 (L) 08/14/2022 0515   RBC 4.03 (L) 08/14/2022 0515   HGB 13.6 08/14/2022 0517   HCT 40.0 08/14/2022 0517   PLT 207 08/14/2022 0515   MCV 99.3 08/14/2022 0515   MCH 33.0 08/14/2022 0515   MCHC 33.3 08/14/2022 0515   RDW 13.6 08/14/2022 0515   LYMPHSABS 1.5 08/14/2022 0515   MONOABS 0.3 08/14/2022 0515   EOSABS 0.1 08/14/2022 0515   BASOSABS 0.0 08/14/2022 0515   CMP    Component Value Date/Time   NA 139 08/14/2022 0517   K 3.8 08/14/2022 0517   CL 106 08/14/2022 0517   CO2 22 08/14/2022 0515   GLUCOSE 99 08/14/2022 0517   BUN 13 08/14/2022 0517   CREATININE  0.90 08/14/2022 0517   CALCIUM 8.3 (L) 08/14/2022 0515   PROT 6.1 (L) 08/14/2022 0515   ALBUMIN 3.2 (L) 08/14/2022 0515   AST 16 08/14/2022 0515   ALT 14 08/14/2022 0515   ALKPHOS 54 08/14/2022 0515   BILITOT 0.7 08/14/2022 0515   GFRNONAA >60 08/14/2022 0515   COAGS Lab Results  Component Value Date   INR 1.1 08/14/2022   Lipid Panel    Component Value Date/Time   CHOL 208 (H) 08/15/2022 1530   TRIG 70 08/15/2022 1530   HDL 68 08/15/2022 1530   CHOLHDL 3.1 08/15/2022 1530   VLDL 14 08/15/2022 1530   LDLCALC 126 (H) 08/15/2022 1530   HgbA1C  Lab Results  Component Value Date   HGBA1C 5.6 08/14/2022   Urinalysis No results found for: "COLORURINE", "APPEARANCEUR", "LABSPEC", "PHURINE", "GLUCOSEU", "HGBUR", "BILIRUBINUR", "KETONESUR", "PROTEINUR", "UROBILINOGEN", "NITRITE", "LEUKOCYTESUR" Urine Drug Screen     Component Value Date/Time   LABOPIA NONE DETECTED 08/14/2022 1130   COCAINSCRNUR POSITIVE (A) 08/14/2022 1130   LABBENZ NONE DETECTED 08/14/2022 1130   AMPHETMU NONE DETECTED 08/14/2022 1130   THCU NONE DETECTED 08/14/2022 1130   LABBARB NONE DETECTED 08/14/2022 1130    Alcohol Level    Component Value Date/Time   ETH <10 08/14/2022 0515   SIGNIFICANT DIAGNOSTIC STUDIES IR PERCUTANEOUS ART THROMBECTOMY/INFUSION INTRACRANIAL INC  Stroke Discharge Summary  Patient ID: [name]   MRN: [id number]      DOB: [birth year]  Date of Admission: 08/14/2022 Date of Discharge: 08/17/2022  Attending Physician:  Caryl Pina, MD, Stroke MD Consultant(s):    cardiology  Patient's PCP:  Pcp, No  DISCHARGE DIAGNOSIS:  Principal Problem:  Acute cortical infarction left occipital lobe Punctate acute infarct left corona radiata Bilateral subdural hematomas Etiology: Left MCA distal M1 LVO s/p TICI 3 revascularization Underlying advanced intracranial atherosclerosis including chronic severe bilateral ICA stenosis. Active Problems:   Middle cerebral artery embolism, left   Agitation   Polysubstance abuse (HCC)   Cerebral infarction due to embolism of left middle cerebral artery (HCC)   Acute on chronic HFrEF (heart failure with reduced ejection fraction) (HCC)  Allergies as of 08/17/2022   Not on File      Medication List     TAKE these medications    aspirin EC 81 MG tablet Take 1 tablet (81 mg total) by mouth daily. Swallow whole. Start taking on: Aug 18, 2022   atorvastatin 80 MG tablet Commonly known as: LIPITOR Take 1 tablet (80 mg total) by mouth at bedtime.   clopidogrel 75 MG tablet Commonly known as: PLAVIX Take 1 tablet (75 mg total) by mouth daily.       LABORATORY STUDIES CBC    Component Value Date/Time   WBC 3.7 (L) 08/14/2022 0515   RBC 4.03 (L) 08/14/2022 0515   HGB 13.6 08/14/2022 0517   HCT 40.0 08/14/2022 0517   PLT 207 08/14/2022 0515   MCV 99.3 08/14/2022 0515   MCH 33.0 08/14/2022 0515   MCHC 33.3 08/14/2022 0515   RDW 13.6 08/14/2022 0515   LYMPHSABS 1.5 08/14/2022 0515   MONOABS 0.3 08/14/2022 0515   EOSABS 0.1 08/14/2022 0515   BASOSABS 0.0 08/14/2022 0515   CMP    Component Value Date/Time   NA 139 08/14/2022 0517   K 3.8 08/14/2022 0517   CL 106 08/14/2022 0517   CO2 22 08/14/2022 0515   GLUCOSE 99 08/14/2022 0517   BUN 13 08/14/2022 0517   CREATININE  0.90 08/14/2022 0517   CALCIUM 8.3 (L) 08/14/2022 0515   PROT 6.1 (L) 08/14/2022 0515   ALBUMIN 3.2 (L) 08/14/2022 0515   AST 16 08/14/2022 0515   ALT 14 08/14/2022 0515   ALKPHOS 54 08/14/2022 0515   BILITOT 0.7 08/14/2022 0515   GFRNONAA >60 08/14/2022 0515   COAGS Lab Results  Component Value Date   INR 1.1 08/14/2022   Lipid Panel    Component Value Date/Time   CHOL 208 (H) 08/15/2022 1530   TRIG 70 08/15/2022 1530   HDL 68 08/15/2022 1530   CHOLHDL 3.1 08/15/2022 1530   VLDL 14 08/15/2022 1530   LDLCALC 126 (H) 08/15/2022 1530   HgbA1C  Lab Results  Component Value Date   HGBA1C 5.6 08/14/2022   Urinalysis No results found for: "COLORURINE", "APPEARANCEUR", "LABSPEC", "PHURINE", "GLUCOSEU", "HGBUR", "BILIRUBINUR", "KETONESUR", "PROTEINUR", "UROBILINOGEN", "NITRITE", "LEUKOCYTESUR" Urine Drug Screen     Component Value Date/Time   LABOPIA NONE DETECTED 08/14/2022 1130   COCAINSCRNUR POSITIVE (A) 08/14/2022 1130   LABBENZ NONE DETECTED 08/14/2022 1130   AMPHETMU NONE DETECTED 08/14/2022 1130   THCU NONE DETECTED 08/14/2022 1130   LABBARB NONE DETECTED 08/14/2022 1130    Alcohol Level    Component Value Date/Time   ETH <10 08/14/2022 0515   SIGNIFICANT DIAGNOSTIC STUDIES IR PERCUTANEOUS ART THROMBECTOMY/INFUSION INTRACRANIAL INC  Stroke Discharge Summary  Patient ID: [name]   MRN: [id number]      DOB: [birth year]  Date of Admission: 08/14/2022 Date of Discharge: 08/17/2022  Attending Physician:  Caryl Pina, MD, Stroke MD Consultant(s):    cardiology  Patient's PCP:  Pcp, No  DISCHARGE DIAGNOSIS:  Principal Problem:  Acute cortical infarction left occipital lobe Punctate acute infarct left corona radiata Bilateral subdural hematomas Etiology: Left MCA distal M1 LVO s/p TICI 3 revascularization Underlying advanced intracranial atherosclerosis including chronic severe bilateral ICA stenosis. Active Problems:   Middle cerebral artery embolism, left   Agitation   Polysubstance abuse (HCC)   Cerebral infarction due to embolism of left middle cerebral artery (HCC)   Acute on chronic HFrEF (heart failure with reduced ejection fraction) (HCC)  Allergies as of 08/17/2022   Not on File      Medication List     TAKE these medications    aspirin EC 81 MG tablet Take 1 tablet (81 mg total) by mouth daily. Swallow whole. Start taking on: Aug 18, 2022   atorvastatin 80 MG tablet Commonly known as: LIPITOR Take 1 tablet (80 mg total) by mouth at bedtime.   clopidogrel 75 MG tablet Commonly known as: PLAVIX Take 1 tablet (75 mg total) by mouth daily.       LABORATORY STUDIES CBC    Component Value Date/Time   WBC 3.7 (L) 08/14/2022 0515   RBC 4.03 (L) 08/14/2022 0515   HGB 13.6 08/14/2022 0517   HCT 40.0 08/14/2022 0517   PLT 207 08/14/2022 0515   MCV 99.3 08/14/2022 0515   MCH 33.0 08/14/2022 0515   MCHC 33.3 08/14/2022 0515   RDW 13.6 08/14/2022 0515   LYMPHSABS 1.5 08/14/2022 0515   MONOABS 0.3 08/14/2022 0515   EOSABS 0.1 08/14/2022 0515   BASOSABS 0.0 08/14/2022 0515   CMP    Component Value Date/Time   NA 139 08/14/2022 0517   K 3.8 08/14/2022 0517   CL 106 08/14/2022 0517   CO2 22 08/14/2022 0515   GLUCOSE 99 08/14/2022 0517   BUN 13 08/14/2022 0517   CREATININE  0.90 08/14/2022 0517   CALCIUM 8.3 (L) 08/14/2022 0515   PROT 6.1 (L) 08/14/2022 0515   ALBUMIN 3.2 (L) 08/14/2022 0515   AST 16 08/14/2022 0515   ALT 14 08/14/2022 0515   ALKPHOS 54 08/14/2022 0515   BILITOT 0.7 08/14/2022 0515   GFRNONAA >60 08/14/2022 0515   COAGS Lab Results  Component Value Date   INR 1.1 08/14/2022   Lipid Panel    Component Value Date/Time   CHOL 208 (H) 08/15/2022 1530   TRIG 70 08/15/2022 1530   HDL 68 08/15/2022 1530   CHOLHDL 3.1 08/15/2022 1530   VLDL 14 08/15/2022 1530   LDLCALC 126 (H) 08/15/2022 1530   HgbA1C  Lab Results  Component Value Date   HGBA1C 5.6 08/14/2022   Urinalysis No results found for: "COLORURINE", "APPEARANCEUR", "LABSPEC", "PHURINE", "GLUCOSEU", "HGBUR", "BILIRUBINUR", "KETONESUR", "PROTEINUR", "UROBILINOGEN", "NITRITE", "LEUKOCYTESUR" Urine Drug Screen     Component Value Date/Time   LABOPIA NONE DETECTED 08/14/2022 1130   COCAINSCRNUR POSITIVE (A) 08/14/2022 1130   LABBENZ NONE DETECTED 08/14/2022 1130   AMPHETMU NONE DETECTED 08/14/2022 1130   THCU NONE DETECTED 08/14/2022 1130   LABBARB NONE DETECTED 08/14/2022 1130    Alcohol Level    Component Value Date/Time   ETH <10 08/14/2022 0515   SIGNIFICANT DIAGNOSTIC STUDIES IR PERCUTANEOUS ART THROMBECTOMY/INFUSION INTRACRANIAL INC  Stroke Discharge Summary  Patient ID: [name]   MRN: [id number]      DOB: [birth year]  Date of Admission: 08/14/2022 Date of Discharge: 08/17/2022  Attending Physician:  Caryl Pina, MD, Stroke MD Consultant(s):    cardiology  Patient's PCP:  Pcp, No  DISCHARGE DIAGNOSIS:  Principal Problem:  Acute cortical infarction left occipital lobe Punctate acute infarct left corona radiata Bilateral subdural hematomas Etiology: Left MCA distal M1 LVO s/p TICI 3 revascularization Underlying advanced intracranial atherosclerosis including chronic severe bilateral ICA stenosis. Active Problems:   Middle cerebral artery embolism, left   Agitation   Polysubstance abuse (HCC)   Cerebral infarction due to embolism of left middle cerebral artery (HCC)   Acute on chronic HFrEF (heart failure with reduced ejection fraction) (HCC)  Allergies as of 08/17/2022   Not on File      Medication List     TAKE these medications    aspirin EC 81 MG tablet Take 1 tablet (81 mg total) by mouth daily. Swallow whole. Start taking on: Aug 18, 2022   atorvastatin 80 MG tablet Commonly known as: LIPITOR Take 1 tablet (80 mg total) by mouth at bedtime.   clopidogrel 75 MG tablet Commonly known as: PLAVIX Take 1 tablet (75 mg total) by mouth daily.       LABORATORY STUDIES CBC    Component Value Date/Time   WBC 3.7 (L) 08/14/2022 0515   RBC 4.03 (L) 08/14/2022 0515   HGB 13.6 08/14/2022 0517   HCT 40.0 08/14/2022 0517   PLT 207 08/14/2022 0515   MCV 99.3 08/14/2022 0515   MCH 33.0 08/14/2022 0515   MCHC 33.3 08/14/2022 0515   RDW 13.6 08/14/2022 0515   LYMPHSABS 1.5 08/14/2022 0515   MONOABS 0.3 08/14/2022 0515   EOSABS 0.1 08/14/2022 0515   BASOSABS 0.0 08/14/2022 0515   CMP    Component Value Date/Time   NA 139 08/14/2022 0517   K 3.8 08/14/2022 0517   CL 106 08/14/2022 0517   CO2 22 08/14/2022 0515   GLUCOSE 99 08/14/2022 0517   BUN 13 08/14/2022 0517   CREATININE  0.90 08/14/2022 0517   CALCIUM 8.3 (L) 08/14/2022 0515   PROT 6.1 (L) 08/14/2022 0515   ALBUMIN 3.2 (L) 08/14/2022 0515   AST 16 08/14/2022 0515   ALT 14 08/14/2022 0515   ALKPHOS 54 08/14/2022 0515   BILITOT 0.7 08/14/2022 0515   GFRNONAA >60 08/14/2022 0515   COAGS Lab Results  Component Value Date   INR 1.1 08/14/2022   Lipid Panel    Component Value Date/Time   CHOL 208 (H) 08/15/2022 1530   TRIG 70 08/15/2022 1530   HDL 68 08/15/2022 1530   CHOLHDL 3.1 08/15/2022 1530   VLDL 14 08/15/2022 1530   LDLCALC 126 (H) 08/15/2022 1530   HgbA1C  Lab Results  Component Value Date   HGBA1C 5.6 08/14/2022   Urinalysis No results found for: "COLORURINE", "APPEARANCEUR", "LABSPEC", "PHURINE", "GLUCOSEU", "HGBUR", "BILIRUBINUR", "KETONESUR", "PROTEINUR", "UROBILINOGEN", "NITRITE", "LEUKOCYTESUR" Urine Drug Screen     Component Value Date/Time   LABOPIA NONE DETECTED 08/14/2022 1130   COCAINSCRNUR POSITIVE (A) 08/14/2022 1130   LABBENZ NONE DETECTED 08/14/2022 1130   AMPHETMU NONE DETECTED 08/14/2022 1130   THCU NONE DETECTED 08/14/2022 1130   LABBARB NONE DETECTED 08/14/2022 1130    Alcohol Level    Component Value Date/Time   ETH <10 08/14/2022 0515   SIGNIFICANT DIAGNOSTIC STUDIES IR PERCUTANEOUS ART THROMBECTOMY/INFUSION INTRACRANIAL INC  Stroke Discharge Summary  Patient ID: [name]   MRN: [id number]      DOB: [birth year]  Date of Admission: 08/14/2022 Date of Discharge: 08/17/2022  Attending Physician:  Caryl Pina, MD, Stroke MD Consultant(s):    cardiology  Patient's PCP:  Pcp, No  DISCHARGE DIAGNOSIS:  Principal Problem:  Acute cortical infarction left occipital lobe Punctate acute infarct left corona radiata Bilateral subdural hematomas Etiology: Left MCA distal M1 LVO s/p TICI 3 revascularization Underlying advanced intracranial atherosclerosis including chronic severe bilateral ICA stenosis. Active Problems:   Middle cerebral artery embolism, left   Agitation   Polysubstance abuse (HCC)   Cerebral infarction due to embolism of left middle cerebral artery (HCC)   Acute on chronic HFrEF (heart failure with reduced ejection fraction) (HCC)  Allergies as of 08/17/2022   Not on File      Medication List     TAKE these medications    aspirin EC 81 MG tablet Take 1 tablet (81 mg total) by mouth daily. Swallow whole. Start taking on: Aug 18, 2022   atorvastatin 80 MG tablet Commonly known as: LIPITOR Take 1 tablet (80 mg total) by mouth at bedtime.   clopidogrel 75 MG tablet Commonly known as: PLAVIX Take 1 tablet (75 mg total) by mouth daily.       LABORATORY STUDIES CBC    Component Value Date/Time   WBC 3.7 (L) 08/14/2022 0515   RBC 4.03 (L) 08/14/2022 0515   HGB 13.6 08/14/2022 0517   HCT 40.0 08/14/2022 0517   PLT 207 08/14/2022 0515   MCV 99.3 08/14/2022 0515   MCH 33.0 08/14/2022 0515   MCHC 33.3 08/14/2022 0515   RDW 13.6 08/14/2022 0515   LYMPHSABS 1.5 08/14/2022 0515   MONOABS 0.3 08/14/2022 0515   EOSABS 0.1 08/14/2022 0515   BASOSABS 0.0 08/14/2022 0515   CMP    Component Value Date/Time   NA 139 08/14/2022 0517   K 3.8 08/14/2022 0517   CL 106 08/14/2022 0517   CO2 22 08/14/2022 0515   GLUCOSE 99 08/14/2022 0517   BUN 13 08/14/2022 0517   CREATININE  0.90 08/14/2022 0517   CALCIUM 8.3 (L) 08/14/2022 0515   PROT 6.1 (L) 08/14/2022 0515   ALBUMIN 3.2 (L) 08/14/2022 0515   AST 16 08/14/2022 0515   ALT 14 08/14/2022 0515   ALKPHOS 54 08/14/2022 0515   BILITOT 0.7 08/14/2022 0515   GFRNONAA >60 08/14/2022 0515   COAGS Lab Results  Component Value Date   INR 1.1 08/14/2022   Lipid Panel    Component Value Date/Time   CHOL 208 (H) 08/15/2022 1530   TRIG 70 08/15/2022 1530   HDL 68 08/15/2022 1530   CHOLHDL 3.1 08/15/2022 1530   VLDL 14 08/15/2022 1530   LDLCALC 126 (H) 08/15/2022 1530   HgbA1C  Lab Results  Component Value Date   HGBA1C 5.6 08/14/2022   Urinalysis No results found for: "COLORURINE", "APPEARANCEUR", "LABSPEC", "PHURINE", "GLUCOSEU", "HGBUR", "BILIRUBINUR", "KETONESUR", "PROTEINUR", "UROBILINOGEN", "NITRITE", "LEUKOCYTESUR" Urine Drug Screen     Component Value Date/Time   LABOPIA NONE DETECTED 08/14/2022 1130   COCAINSCRNUR POSITIVE (A) 08/14/2022 1130   LABBENZ NONE DETECTED 08/14/2022 1130   AMPHETMU NONE DETECTED 08/14/2022 1130   THCU NONE DETECTED 08/14/2022 1130   LABBARB NONE DETECTED 08/14/2022 1130    Alcohol Level    Component Value Date/Time   ETH <10 08/14/2022 0515   SIGNIFICANT DIAGNOSTIC STUDIES IR PERCUTANEOUS ART THROMBECTOMY/INFUSION INTRACRANIAL INC

## 2022-08-17 NOTE — Significant Event (Signed)
Reviewed AVS with patient and his niece; a copy of it given to them to take home. Patient and family verbalize understanding of content in AVS. Has all his belongings with him for discharge to home. Taken to transportation via wheelchair by NT.

## 2022-08-17 NOTE — TOC Benefit Eligibility Note (Signed)
Patient Product/process development scientist completed.    The patient is currently admitted and upon discharge could be taking Entresto 24-26 mg.  The current 30 day co-pay is $4.60.   The patient is currently admitted and upon discharge could be taking Farxiga 10 mg.  The current 30 day co-pay is $4.60.   The patient is currently admitted and upon discharge could be taking Jardiance 10 mg.  The current 30 day co-pay is $4.60.   The patient is insured through [employer]   This test claim was processed through [facility]- copay amounts may vary at other pharmacies due to pharmacy/plan contracts, or as the patient moves through the different stages of their insurance plan.  Roland Earl, CPHT Pharmacy Patient Advocate Specialist [facility] Patient Advocate Team Direct Number: [phone]  Fax: [phone]

## 2022-08-17 NOTE — Telephone Encounter (Signed)
Pharmacy Patient Advocate Encounter  Insurance verification completed.    The patient is insured through [employer] Part [employer]   The patient is currently admitted and ran test claims for the following: Sherryll Burger, Farxiga, Jardiance.  Copays and coinsurance results were relayed to Inpatient clinical team.

## 2022-08-17 NOTE — Progress Notes (Signed)
Occupational Therapy Treatment Patient Details Name: [name] MRN: [id number] DOB: 1955/12/25 Today's Date: 08/17/2022   History of present illness Pt is a 67 y/o male presenting on 5/19 with acute onset of R hemiplegia and aphasia. S/P TICI 3 revascularization of L MCA M1. MRI with punctate acute infarct of L corona radiata, acute cortical infarction in L occipital lobe, and unchanged bilateral subdural hematomas. Extubated 5/20. 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   OT comments  Patient demonstrating good gains with OT treatment with patient meeting goals for dressing and following commands. Patient declined grooming but performed UB dressing with donning gown to cover back and able to doff/donn socks seated on EOB. Patient performed mobility without assistive device and asked to return to supine. Acute OT to continue to follow.    Recommendations for follow up therapy are one component of a multi-disciplinary discharge planning process, led by the attending physician.  Recommendations may be updated based on patient status, additional functional criteria and insurance authorization.    Assistance Recommended at Discharge Frequent or constant Supervision/Assistance  Patient can return home with the following  Assistance with cooking/housework;Direct supervision/assist for medications management;Direct supervision/assist for financial management;Assist for transportation;Help with stairs or ramp for entrance;A little help with walking and/or transfers;A little help with bathing/dressing/bathroom   Equipment Recommendations  BSC/3in1    Recommendations for Other Services      Precautions / Restrictions Precautions Precautions: Fall Restrictions Weight Bearing Restrictions: No       Mobility Bed Mobility Overal bed mobility: Modified Independent             General bed mobility comments: able to  get to EOB and back to supine without assistance    Transfers Overall transfer level: Needs assistance Equipment used: None Transfers: Sit to/from Stand Sit to Stand: Supervision           General transfer comment: supervision for safety     Balance Overall balance assessment: Needs assistance Sitting-balance support: Feet supported Sitting balance-Leahy Scale: Good     Standing balance support: No upper extremity supported, During functional activity Standing balance-Leahy Scale: Good Standing balance comment: stood for donning gown with supervision                           ADL either performed or assessed with clinical judgement   ADL Overall ADL's : Needs assistance/impaired                 Upper Body Dressing : Supervision/safety;Standing Upper Body Dressing Details (indicate cue type and reason): gown to cover back Lower Body Dressing: Supervision/safety;Sitting/lateral leans Lower Body Dressing Details (indicate cue type and reason): able to doff/donn socks seated on EOB               General ADL Comments: declined grooming or toilet transfers    Extremity/Trunk Assessment              Vision       Perception     Praxis      Cognition Arousal/Alertness: Awake/alert Behavior During Therapy: WFL for tasks assessed/performed Overall Cognitive Status: Impaired/Different from baseline Area of Impairment: Memory, Safety/judgement, Problem solving                     Memory: Decreased short-term memory   Safety/Judgement: Decreased awareness of safety, Decreased awareness of deficits  Problem Solving: Requires verbal cues General Comments: able to follow 2 step commands with increased time        Exercises      Shoulder Instructions       General Comments      Pertinent Vitals/ Pain       Pain Assessment Pain Assessment: No/denies pain Faces Pain Scale: No hurt Pain Intervention(s): Monitored during  session  Home Living                                          Prior Functioning/Environment              Frequency  Min 2X/week        Progress Toward Goals  OT Goals(current goals can now be found in the care plan section)  Progress towards OT goals: Progressing toward goals  Acute Rehab OT Goals Patient Stated Goal: go home OT Goal Formulation: With patient Time For Goal Achievement: 08/29/22 Potential to Achieve Goals: Good ADL Goals Pt Will Perform Lower Body Dressing: with min assist;sit to/from stand;sitting/lateral leans Pt Will Transfer to Toilet: with min assist;ambulating;stand pivot transfer;bedside commode Additional ADL Goal #1: Pt will follow 2 step commands consistently during ADL routine.  Plan Discharge plan remains appropriate    Co-evaluation                 AM-PAC OT "6 Clicks" Daily Activity     Outcome Measure   Help from another person eating meals?: None Help from another person taking care of personal grooming?: A Little Help from another person toileting, which includes using toliet, bedpan, or urinal?: A Little Help from another person bathing (including washing, rinsing, drying)?: A Little Help from another person to put on and taking off regular upper body clothing?: A Little Help from another person to put on and taking off regular lower body clothing?: A Little 6 Click Score: 19    End of Session Equipment Utilized During Treatment: Gait belt  OT Visit Diagnosis: Other abnormalities of gait and mobility (R26.89);Muscle weakness (generalized) (M62.81);Other symptoms and signs involving cognitive function   Activity Tolerance Patient tolerated treatment well   Patient Left in bed;with call bell/phone within reach;with bed alarm set   Nurse Communication Mobility status        Time: 6578-4696 OT Time Calculation (min): 15 min  Charges: OT General Charges $OT Visit: 1 Visit OT Treatments $Self  Care/Home Management : 8-22 mins  Alfonse Flavors, OTA Acute Rehabilitation Services  Office [phone]   Dewain Penning 08/17/2022, 2:55 PM

## 2022-08-17 NOTE — Progress Notes (Signed)
   Heart Failure Stewardship Pharmacist Progress Note   PCP: Pcp, No PCP-Cardiologist: None   HPI: 67 YO male with a PMH of stroke with LUE weakness, non-Hodgkin's lymphoma, s/p portacath placement followed by removal on 07/26/22, CAD s/p CABG x 4 in 2010, CHF and substance abuse.   Patient presented to the ED on 5/20 AM as a code stroke, underwent thrombectomy and was extubated that evening. Planning ASA and Plavix for at least 3 months followed by ASA monotherapy, per neurology. Inpatient echo from 02/2021 showed LVEF of 40-45%, akinesis of the entire apex--similar to prior echo from 2021. Echo from 5/20 showed LVEF of 20 to 25%, severe global hypokinesis, abnormal global longitudinal strain, normal RV size and function, trivial MR.   Current HF Medications: None  Prior to admission HF Medications: None  Pertinent Lab Values (from 5/20): Serum creatinine 0.90, BUN 13, Potassium 3.8, Sodium 139, A1c 5.6%, Magnesium pending  Vital Signs: Weight: 110 lbs (admission weight: 108 lbs) Blood pressure: 90s-110s/70s  Heart rate: 70s-80s  I/O: +1.3L yesterday; net -1.1L  Medication Assistance / Insurance Benefits Check: Does the patient have prescription insurance?  Yes Type of insurance plan: Medicare, Medicaid  Does the patient qualify for medication assistance through manufacturers or grants?   No  Outpatient Pharmacy:  Prior to admission [facility] Is the patient willing to use [facility] pharmacy at discharge? Yes Is the patient willing to transition their  outpatient pharmacy to utilize a [facility] outpatient pharmacy?   No    Assessment: 1. Acute systolic CHF (LVEF 20-25%), due to acute CVA. NYHA class II symptoms. - Scr from 5/20 stable at 0.9. Strict I's and O's. Keep K >4 and Mg >2. Will follow-up Mg and BMP tomorrow.  - Recommend starting an SGLT2i if renal function remains stable in the morning (f/u BMP) - Low BP preventing the addition of spironolactone,  Entresto, and beta blocker at this time, but is improving.    Plan: 1) Medication changes recommended at this time: - F/u Mg and repeat BMP tomorrow AM  - Recommend starting an SGLT2i if renal function remains stable in the morning  2) Patient assistance: - Jardiance, Farxiga, Entresto - $4.60 per 30 day supply   3)  Education  - To be completed prior to discharge   Cherylin Mylar, PharmD PGY1 Pharmacy Resident 5/23/202412:25 PM

## 2022-08-17 NOTE — Progress Notes (Signed)
Physical Therapy Treatment Patient Details Name: [name] MRN: [id number] DOB: 23-Dec-1955 Today's Date: 08/17/2022   History of Present Illness Pt is a 67 y/o male presenting on 5/19 with acute onset of R hemiplegia and aphasia. S/P TICI 3 revascularization of L MCA M1. MRI with punctate acute infarct of L corona radiata, acute cortical infarction in L occipital lobe, and unchanged bilateral subdural hematomas. Extubated 5/20. 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    PT Comments    Pt continues to display improved balance, but still drifts to the R. Pt did bump into an obstacle on the R initially but negotiated around them, yet proximal to them on the R, for the remainder of the session. Challenged pt's dynamic gait balance through having pt change head positions, speeds, and directions and negotiate over and around obstacles. No LOB, min guard for safety. He continues to display deficits in cognition though as he was disoriented to the date. Will continue to follow acutely.    Recommendations for follow up therapy are one component of a multi-disciplinary discharge planning process, led by the attending physician.  Recommendations may be updated based on patient status, additional functional criteria and insurance authorization.  Follow Up Recommendations  Can patient physically be transported by private vehicle: Yes    Assistance Recommended at Discharge Intermittent Supervision/Assistance  Patient can return home with the following Assistance with cooking/housework;Direct supervision/assist for medications management;Direct supervision/assist for financial management;Assist for transportation;Help with stairs or ramp for entrance   Equipment Recommendations  None recommended by PT    Recommendations for Other Services       Precautions / Restrictions Precautions Precautions: Fall Restrictions Weight  Bearing Restrictions: No     Mobility  Bed Mobility Overal bed mobility: Modified Independent             General bed mobility comments: No assist needed    Transfers Overall transfer level: Needs assistance Equipment used: None Transfers: Sit to/from Stand Sit to Stand: Supervision           General transfer comment: Supervision for safety, no LOB    Ambulation/Gait Ambulation/Gait assistance: Min guard Gait Distance (Feet): 380 Feet Assistive device: None Gait Pattern/deviations: Step-through pattern, Decreased stride length, Decreased dorsiflexion - right, Decreased step length - right, Drifts right/left, Decreased dorsiflexion - left Gait velocity: decreased Gait velocity interpretation: <1.8 ft/sec, indicate of risk for recurrent falls   General Gait Details: Slow, mildly unsteady gait with slight drift to R. Decreased R step length and bilateral foot clearance. Pt bumping into R side of doorframe initially but otherwise no other bumping into obstacles but getting proximal, yet going around them, on the R. No LOB with head turns, head nods, speed changes, and stepping over obstacles, minguard for safety   Stairs             Wheelchair Mobility    Modified Rankin (Stroke Patients Only) Modified Rankin (Stroke Patients Only) Pre-Morbid Rankin Score: No significant disability Modified Rankin: Moderately severe disability     Balance Overall balance assessment: Needs assistance Sitting-balance support: Feet supported Sitting balance-Leahy Scale: Good     Standing balance support: No upper extremity supported, During functional activity Standing balance-Leahy Scale: Good                              Cognition Arousal/Alertness: Awake/alert Behavior During Therapy:  WFL for tasks assessed/performed Overall Cognitive Status: Impaired/Different from baseline Area of Impairment: Memory, Safety/judgement, Problem solving                      Memory: Decreased short-term memory   Safety/Judgement: Decreased awareness of safety, Decreased awareness of deficits   Problem Solving: Requires verbal cues General Comments: pt calm and cooperative throughout treatment and following commands. Oriented to location, self, and situation but not date        Exercises      General Comments        Pertinent Vitals/Pain Pain Assessment Pain Assessment: Faces Faces Pain Scale: No hurt Pain Intervention(s): Monitored during session    Home Living                          Prior Function            PT Goals (current goals can now be found in the care plan section) Acute Rehab PT Goals Patient Stated Goal: to go home PT Goal Formulation: With patient Time For Goal Achievement: 08/29/22 Potential to Achieve Goals: Good Progress towards PT goals: Progressing toward goals    Frequency    Min 4X/week      PT Plan Current plan remains appropriate    Co-evaluation              AM-PAC PT "6 Clicks" Mobility   Outcome Measure  Help needed turning from your back to your side while in a flat bed without using bedrails?: None Help needed moving from lying on your back to sitting on the side of a flat bed without using bedrails?: None Help needed moving to and from a bed to a chair (including a wheelchair)?: A Little Help needed standing up from a chair using your arms (e.g., wheelchair or bedside chair)?: A Little Help needed to walk in hospital room?: A Little Help needed climbing 3-5 steps with a railing? : A Little 6 Click Score: 20    End of Session Equipment Utilized During Treatment: Gait belt Activity Tolerance: Patient tolerated treatment well Patient left: with call bell/phone within reach;in bed;with bed alarm set   PT Visit Diagnosis: Other abnormalities of gait and mobility (R26.89);Other symptoms and signs involving the nervous system (R29.898);Unsteadiness on feet (R26.81)      Time: 1446-1500 PT Time Calculation (min) (ACUTE ONLY): 14 min  Charges:  $Gait Training: 8-22 mins                     Raymond Gurney, PT, DPT Acute Rehabilitation Services  Office: [phone]    Jewel Baize 08/17/2022, 5:51 PM

## 2022-08-18 ENCOUNTER — Encounter: Payer: Self-pay | Admitting: Internal Medicine

## 2022-08-18 ENCOUNTER — Telehealth (INDEPENDENT_AMBULATORY_CARE_PROVIDER_SITE_OTHER): Payer: Self-pay | Admitting: Primary Care

## 2022-08-18 NOTE — Telephone Encounter (Signed)
Chelsea calling from [facility] is calling to report received referral from the hospital for [facility]. Hospital Follow up scheduled 09/01/22. Pt is actively seeing oncology is very compliant. No PCP. Chelsea would like to know is Marcelino Duster ok with evaluation visit this weekend prior to Hospital follow up. Pt is fall risk. And has already fallen after hospital discharge. Please advise Cb- [phone] verbal ok on VM

## 2022-08-22 NOTE — Telephone Encounter (Signed)
Returned call to [place] she was not available got the answering service and spoke to [place]. Laneta Simmers took a message to let Leeroy Bock know I am unable to provide verbal orders till we see the pt which he is scheduled for an appt for 6/7

## 2022-08-24 ENCOUNTER — Encounter: Payer: Self-pay | Admitting: Internal Medicine

## 2022-08-31 ENCOUNTER — Telehealth (INDEPENDENT_AMBULATORY_CARE_PROVIDER_SITE_OTHER): Payer: Self-pay | Admitting: Primary Care

## 2022-08-31 NOTE — Telephone Encounter (Signed)
Left Vm with pt.

## 2022-09-01 ENCOUNTER — Inpatient Hospital Stay (INDEPENDENT_AMBULATORY_CARE_PROVIDER_SITE_OTHER): Payer: Medicare Other | Admitting: Primary Care

## 2022-09-04 ENCOUNTER — Encounter (HOSPITAL_COMMUNITY): Payer: Medicare Other

## 2022-11-15 ENCOUNTER — Encounter: Payer: Self-pay | Admitting: Neurology

## 2022-11-15 ENCOUNTER — Inpatient Hospital Stay: Payer: Medicare Other | Admitting: Neurology

## 2022-11-24 ENCOUNTER — Other Ambulatory Visit: Payer: Self-pay

## 2022-11-24 NOTE — Patient Outreach (Signed)
Telephone outreach to patient to obtain mRS was successfully completed. MRS= 1  [name] [facility] Care Management Assistant [phone] 

## 2023-01-18 ENCOUNTER — Other Ambulatory Visit: Payer: Medicare Other

## 2023-01-18 ENCOUNTER — Telehealth: Payer: Self-pay | Admitting: Medical Oncology

## 2023-01-18 ENCOUNTER — Ambulatory Visit: Payer: Medicare Other | Admitting: Internal Medicine

## 2023-01-18 NOTE — Telephone Encounter (Signed)
Niece said she scheduled pt's ride today for his appt at [facility].

## 2023-01-27 ENCOUNTER — Telehealth: Payer: Self-pay | Admitting: Internal Medicine

## 2023-01-27 NOTE — Telephone Encounter (Signed)
Vm left with patient's niece Suzette Battiest to reschedule patient's appointment from 10/24.

## 2023-02-02 ENCOUNTER — Telehealth: Payer: Self-pay | Admitting: Internal Medicine

## 2023-02-02 NOTE — Telephone Encounter (Signed)
Called and spoke with niece Suzette Battiest), schedule appointments and aware of time and dates. [name] requested appointments scheduled after Thanksgiving.

## 2023-02-28 ENCOUNTER — Inpatient Hospital Stay: Payer: Medicare Other | Admitting: Internal Medicine

## 2023-02-28 ENCOUNTER — Inpatient Hospital Stay: Payer: Medicare Other

## 2023-05-07 ENCOUNTER — Telehealth: Payer: Self-pay | Admitting: Internal Medicine

## 2023-05-07 ENCOUNTER — Encounter: Payer: Self-pay | Admitting: Internal Medicine

## 2023-05-07 ENCOUNTER — Other Ambulatory Visit: Payer: Self-pay | Admitting: Medical Oncology

## 2023-05-07 DIAGNOSIS — C8338 Diffuse large B-cell lymphoma, lymph nodes of multiple sites: Secondary | ICD-10-CM

## 2023-05-08 ENCOUNTER — Inpatient Hospital Stay (HOSPITAL_BASED_OUTPATIENT_CLINIC_OR_DEPARTMENT_OTHER): Payer: Medicare Other | Admitting: Internal Medicine

## 2023-05-08 ENCOUNTER — Inpatient Hospital Stay: Payer: Medicare Other | Attending: Internal Medicine

## 2023-05-08 VITALS — BP 124/76 | HR 71 | Temp 98.8°F | Resp 18 | Wt 116.7 lb

## 2023-05-08 DIAGNOSIS — Z8673 Personal history of transient ischemic attack (TIA), and cerebral infarction without residual deficits: Secondary | ICD-10-CM | POA: Insufficient documentation

## 2023-05-08 DIAGNOSIS — C8333 Diffuse large B-cell lymphoma, intra-abdominal lymph nodes: Secondary | ICD-10-CM | POA: Diagnosis not present

## 2023-05-08 DIAGNOSIS — C8338 Diffuse large B-cell lymphoma, lymph nodes of multiple sites: Secondary | ICD-10-CM

## 2023-05-08 DIAGNOSIS — Z7982 Long term (current) use of aspirin: Secondary | ICD-10-CM | POA: Insufficient documentation

## 2023-05-08 DIAGNOSIS — J069 Acute upper respiratory infection, unspecified: Secondary | ICD-10-CM | POA: Insufficient documentation

## 2023-05-08 DIAGNOSIS — D72819 Decreased white blood cell count, unspecified: Secondary | ICD-10-CM | POA: Diagnosis not present

## 2023-05-08 DIAGNOSIS — Z9221 Personal history of antineoplastic chemotherapy: Secondary | ICD-10-CM | POA: Diagnosis not present

## 2023-05-08 DIAGNOSIS — C8253 Diffuse follicle center lymphoma, intra-abdominal lymph nodes: Secondary | ICD-10-CM | POA: Insufficient documentation

## 2023-05-08 DIAGNOSIS — Z79899 Other long term (current) drug therapy: Secondary | ICD-10-CM | POA: Diagnosis not present

## 2023-05-08 LAB — CBC WITH DIFFERENTIAL (CANCER CENTER ONLY)
Abs Immature Granulocytes: 0 10*3/uL (ref 0.00–0.07)
Basophils Absolute: 0 10*3/uL (ref 0.0–0.1)
Basophils Relative: 1 %
Eosinophils Absolute: 0.1 10*3/uL (ref 0.0–0.5)
Eosinophils Relative: 2 %
HCT: 44.8 % (ref 39.0–52.0)
Hemoglobin: 14.6 g/dL (ref 13.0–17.0)
Immature Granulocytes: 0 %
Lymphocytes Relative: 31 %
Lymphs Abs: 1 10*3/uL (ref 0.7–4.0)
MCH: 32.9 pg (ref 26.0–34.0)
MCHC: 32.6 g/dL (ref 30.0–36.0)
MCV: 100.9 fL — ABNORMAL HIGH (ref 80.0–100.0)
Monocytes Absolute: 0.2 10*3/uL (ref 0.1–1.0)
Monocytes Relative: 6 %
Neutro Abs: 2 10*3/uL (ref 1.7–7.7)
Neutrophils Relative %: 60 %
Platelet Count: 264 10*3/uL (ref 150–400)
RBC: 4.44 MIL/uL (ref 4.22–5.81)
RDW: 12.3 % (ref 11.5–15.5)
WBC Count: 3.3 10*3/uL — ABNORMAL LOW (ref 4.0–10.5)
nRBC: 0 % (ref 0.0–0.2)

## 2023-05-08 LAB — LACTATE DEHYDROGENASE: LDH: 124 U/L (ref 98–192)

## 2023-05-08 LAB — CMP (CANCER CENTER ONLY)
ALT: 16 U/L (ref 0–44)
AST: 15 U/L (ref 15–41)
Albumin: 4 g/dL (ref 3.5–5.0)
Alkaline Phosphatase: 68 U/L (ref 38–126)
Anion gap: 5 (ref 5–15)
BUN: 16 mg/dL (ref 8–23)
CO2: 32 mmol/L (ref 22–32)
Calcium: 9.6 mg/dL (ref 8.9–10.3)
Chloride: 107 mmol/L (ref 98–111)
Creatinine: 0.99 mg/dL (ref 0.61–1.24)
GFR, Estimated: >60 mL/min (ref >60)
Glucose, Bld: 67 mg/dL — ABNORMAL LOW (ref 70–99)
Potassium: 3.9 mmol/L (ref 3.5–5.1)
Sodium: 144 mmol/L (ref 135–145)
Total Bilirubin: 0.4 mg/dL (ref 0.0–1.2)
Total Protein: 7.2 g/dL (ref 6.5–8.1)

## 2023-05-08 NOTE — Progress Notes (Signed)
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    PRIOR THERAPY: Systemic chemotherapy with R-CHOP every 3 weeks.  First dose May 20, 2019.  Status post 8 cycles.  Last dose was given October 14, 2019.  CURRENT THERAPY: Observation.  INTERVAL HISTORY: Larry HARRIOTT 68 y.o. male to the clinic today for 6 months follow-up visit.Discussed the use of AI scribe software for clinical note transcription with the patient, who gave verbal consent to proceed.  History of Present Illness   [name] is a 68 year old male with stage four non-Hodgkin lymphoma who presents for routine follow-up and lab work.  Diagnosed with stage four non-Hodgkin lymphoma in February 2021, he completed eight cycles of chemotherapy by July 2021. Since then, he has been under surveillance with regular follow-ups. His last visit was six months ago, during which a scan showed no concerning findings. He is here today for routine follow-up and lab work.  He feels like he has a 'bad cold' with symptoms including a 'real bad runny nose' and frequent coughing. No nausea, vomiting, chest pain, or shortness of breath. He mentions a possible recent weight loss.        MEDICAL HISTORY: Past Medical History:  Diagnosis Date   CAD (coronary artery disease)    a. 04/2008 Cath: LM nl, LAD 50p, 35m, LCX min irregs, RCA 50p/m, 40d, RPDA 60-70ost; b. 04/2008 CABG x 4: LIMA->LAD, VG->D2, VG->RPDA->RPL.   Cancer (HCC)    Cocaine abuse (HCC)    a. Quit early  2019.   Ischemic cardiomyopathy    a. LV gram: EF 30-35%, apical/periapical AK.   Stroke [facility]) 02/2019   Stroke (HCC) 02/2019   Tobacco abuse     ALLERGIES:  has no known allergies.  MEDICATIONS:  Current Outpatient Medications  Medication Sig Dispense Refill   aspirin EC 81 MG tablet Take 1 tablet (81 mg total) by mouth daily. 30 tablet 3   aspirin EC 81 MG tablet Take 1 tablet (81 mg total) by mouth daily. Swallow whole. 30 tablet 12   atorvastatin (LIPITOR) 80 MG tablet Take 1 tablet (80 mg total) by mouth at bedtime. 30 tablet 1   benzonatate (TESSALON) 100 MG capsule Take 1 capsule (100 mg total) by mouth every 8 (eight) hours. 21 capsule 0   clopidogrel (PLAVIX) 75 MG tablet Take 1 tablet (75 mg total) by mouth daily. 90 tablet 0   rosuvastatin (CRESTOR) 20 MG tablet Take 1 tablet (20 mg total) by mouth daily. 90 tablet 3   No current facility-administered medications for this visit.   Facility-Administered Medications Ordered in Other Visits  Medication Dose Route Frequency Provider Last Rate Last Admin   heparin lock flush 100 unit/mL  500 Units Intracatheter Once Si Gaul, MD       heparin lock flush 100 unit/mL  500 Units Intracatheter Once Si Gaul, MD       sodium chloride flush (NS) 0.9 % injection 10 mL  10 mL Intracatheter Once Si Gaul, MD  sodium chloride flush (NS) 0.9 % injection 10 mL  10 mL Intracatheter Once Si Gaul, MD        SURGICAL HISTORY:  Past Surgical History:  Procedure Laterality Date   BYPASS GRAFT  2010   CARDIAC SURGERY  2010   IR ANGIO INTRA EXTRACRAN SEL COM CAROTID INNOMINATE UNI R MOD SED  08/14/2022   IR ANGIO VERTEBRAL SEL SUBCLAVIAN INNOMINATE UNI R MOD SED  08/14/2022   IR CT HEAD LTD  08/14/2022   IR IMAGING GUIDED PORT INSERTION  05/26/2019   IR PERCUTANEOUS ART THROMBECTOMY/INFUSION INTRACRANIAL INC DIAG ANGIO  08/14/2022   IR REMOVAL TUN ACCESS W/ PORT W/O FL MOD SED  07/26/2022   RADIOLOGY WITH  ANESTHESIA N/A 08/14/2022   Procedure: IR WITH ANESTHESIA;  Surgeon: Julieanne Cotton, MD;  Location: [facility] OR;  Service: Radiology;  Laterality: N/A;   SUPRACLAVICAL NODE BIOPSY Left 05/08/2019   Procedure: SUPRACLAVICAL NODE BIOPSY;  Surgeon: Loreli Slot, MD;  Location: [facility] OR;  Service: Thoracic;  Laterality: Left;    REVIEW OF SYSTEMS:  A comprehensive review of systems was negative except for: Respiratory: positive for cough   PHYSICAL EXAMINATION: General appearance: alert, cooperative, fatigued, and no distress Head: Normocephalic, without obvious abnormality, atraumatic Neck: no adenopathy, no JVD, supple, symmetrical, trachea midline, and thyroid not enlarged, symmetric, no tenderness/mass/nodules Lymph nodes: Cervical, supraclavicular, and axillary nodes normal. Resp: clear to auscultation bilaterally Back: symmetric, no curvature. ROM normal. No CVA tenderness. Cardio: regular rate and rhythm, S1, S2 normal, no murmur, click, rub or gallop GI: soft, non-tender; bowel sounds normal; no masses,  no organomegaly Extremities: extremities normal, atraumatic, no cyanosis or edema  ECOG PERFORMANCE STATUS: 1 - Symptomatic but completely ambulatory  Blood pressure 124/76, pulse 71, temperature 98.8 F (37.1 C), temperature source Temporal, resp. rate 18, weight 116 lb 11.2 oz (52.9 kg), SpO2 99%.  LABORATORY DATA: Lab Results  Component Value Date   WBC 3.3 (L) 05/08/2023   HGB 14.6 05/08/2023   HCT 44.8 05/08/2023   MCV 100.9 (H) 05/08/2023   PLT 264 05/08/2023      Chemistry      Component Value Date/Time   NA 139 08/14/2022 0517   K 3.8 08/14/2022 0517   CL 106 08/14/2022 0517   CO2 22 08/14/2022 0515   BUN 13 08/14/2022 0517   CREATININE 0.90 08/14/2022 0517   CREATININE 0.75 06/06/2022 0953      Component Value Date/Time   CALCIUM 8.3 (L) 08/14/2022 0515   ALKPHOS 54 08/14/2022 0515   AST 16 08/14/2022 0515   AST 14 (L) 06/06/2022 0953   ALT 14  08/14/2022 0515   ALT 13 06/06/2022 0953   BILITOT 0.7 08/14/2022 0515   BILITOT 0.3 06/06/2022 0953       RADIOGRAPHIC STUDIES: No results found.   ASSESSMENT AND PLAN: This is a 68 years old African-American male recently diagnosed with 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  The patient completed course of systemic chemotherapy with R-CHOP status post 8 cycles.  He tolerated his treatment fairly well with no significant adverse effects. The patient has been on observation since August 2021.  Stage IV Non-Hodgkin Lymphoma Diagnosed February 2021. Completed eight cycles of chemotherapy by July 2021. Last scan six months ago was favorable. Current labs show  mild leukopenia, likely secondary to recent viral infection. No significant weight loss, chest pain, or dyspnea. Physical exam unremarkable. Prefers regular monitoring every six months. - Schedule follow-up in six months with lab work and scan  Upper Respiratory Infection Presents with rhinorrhea and frequent cough. No nausea, vomiting, chest pain, or dyspnea. Symptoms consistent with viral URI. - Monitor symptoms and manage conservatively.   The patient was advised to call immediately if he has any concerning symptoms in the interval. The patient voices understanding of current disease status and treatment options and is in agreement with the current care plan.  All questions were answered. The patient knows to call the clinic with any problems, questions or concerns. We can certainly see the patient much sooner if necessary.   Disclaimer: This note was dictated with voice recognition software. Similar sounding words can inadvertently be transcribed and may not be corrected upon review.

## 2023-05-29 ENCOUNTER — Ambulatory Visit
Admission: RE | Admit: 2023-05-29 | Discharge: 2023-05-29 | Disposition: A | Discharge status: Home / Self Care | Source: Ambulatory Visit | Attending: Internal Medicine | Admitting: Internal Medicine

## 2023-05-29 ENCOUNTER — Other Ambulatory Visit: Payer: Self-pay

## 2023-05-29 VITALS — BP 117/74 | HR 67 | Temp 97.7°F | Resp 16 | Ht 66.0 in | Wt 116.6 lb

## 2023-05-29 DIAGNOSIS — R350 Frequency of micturition: Secondary | ICD-10-CM | POA: Diagnosis not present

## 2023-05-29 DIAGNOSIS — N3001 Acute cystitis with hematuria: Secondary | ICD-10-CM

## 2023-05-29 LAB — POCT URINALYSIS DIP (MANUAL ENTRY)
Bilirubin, UA: NEGATIVE
Glucose, UA: NEGATIVE mg/dL
Ketones, POC UA: NEGATIVE mg/dL
Nitrite, UA: NEGATIVE
Protein Ur, POC: 100 mg/dL — AB
Spec Grav, UA: 1.02 (ref 1.010–1.025)
Urobilinogen, UA: 1 U/dL
pH, UA: 7.5 (ref 5.0–8.0)

## 2023-05-29 MED ORDER — SULFAMETHOXAZOLE-TRIMETHOPRIM 800-160 MG PO TABS: 1.0000 | ORAL_TABLET | Freq: Two times a day (BID) | ORAL | 0 refills | Status: AC

## 2023-05-29 NOTE — Discharge Instructions (Signed)
 It appears that you may have a urinary tract infection so I have prescribed an antibiotic to help treat this.  Urine culture is pending.  Please follow-up if any symptoms persist or worsen.

## 2023-05-29 NOTE — ED Provider Notes (Signed)
 [facility]    CSN: [id number] Arrival date & time: 05/29/23  0843      History   Chief Complaint Chief Complaint  Patient presents with   Urinary Frequency    HPI [name] is a 68 y.o. male.   Patient presents with concern of urinary tract infection symptoms of urinary urgency and frequency that started about a month ago.  Patient states this is the first time he has been evaluated since symptoms started.  He denies history of frequent urinary tract infections.  Denies hematuria, abdominal pain, flank pain, fever.  Patient states that he is sexually active but denies any concern or exposure to STD.   Urinary Frequency    Past Medical History:  Diagnosis Date   CAD (coronary artery disease)    a. 04/2008 Cath: LM nl, LAD 50p, 63m, LCX min irregs, RCA 50p/m, 40d, RPDA 60-70ost; b. 04/2008 CABG x 4: LIMA->LAD, VG->D2, VG->RPDA->RPL.   Cancer (HCC)    Cocaine abuse (HCC)    a. Quit early 2019.   Ischemic cardiomyopathy    a. LV gram: EF 30-35%, apical/periapical AK.   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   Acute on chronic HFrEF (heart failure with reduced ejection fraction) (HCC) 08/15/2022   Stroke (cerebrum) (HCC) 08/14/2022   Middle cerebral artery embolism, left 08/14/2022   Left leg weakness 03/02/2021   Chronic systolic CHF (congestive heart failure) (HCC) 03/02/2021   Syncope 03/01/2021   Port-A-Cath in place 09/09/2019   Cancer related pain 07/01/2019   Non-Hodgkin lymphoma (HCC) 05/13/2019   Goals of care, counseling/discussion 05/13/2019   Encounter for antineoplastic chemotherapy 05/13/2019   Mass of upper lobe of right lung 04/03/2019   Lymphadenopathy, inguinal 04/03/2019   Splenomegaly 04/03/2019   Lung mass  03/24/2019   Slow transit constipation    Inguinal mass    Chronic combined systolic and diastolic CHF (congestive heart failure) (HCC)    Mass of left inguinal region    Greater trochanteric bursitis of left hip    Hyponatremia    Acute blood loss anemia    Chronic combined systolic (congestive) and diastolic (congestive) heart failure (HCC)    Essential hypertension    Left leg pain    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   Left hemiparesis (HCC) 03/06/2019   Acute cerebrovascular accident (CVA) (HCC) 03/04/2019   Chest pain 10/16/2017   Hyperlipidemia 06/16/2008   TOBACCO ABUSE 06/16/2008   Cannabis abuse 06/16/2008   Cocaine abuse (HCC) 06/16/2008   CAD, ARTERY BYPASS GRAFT 04/30/2008    Past Surgical History:  Procedure Laterality Date   BYPASS GRAFT  2010   CARDIAC SURGERY  2010   IR ANGIO INTRA EXTRACRAN SEL COM CAROTID INNOMINATE UNI R MOD SED  08/14/2022   IR ANGIO VERTEBRAL SEL SUBCLAVIAN INNOMINATE UNI R MOD SED  08/14/2022   IR CT HEAD LTD  08/14/2022   IR IMAGING GUIDED PORT INSERTION  05/26/2019   IR PERCUTANEOUS ART THROMBECTOMY/INFUSION INTRACRANIAL INC DIAG ANGIO  08/14/2022   IR REMOVAL TUN ACCESS W/ PORT W/O FL MOD SED  07/26/2022   RADIOLOGY WITH ANESTHESIA N/A 08/14/2022   Procedure: IR WITH ANESTHESIA;  Surgeon: Julieanne Cotton, MD;  Location: [facility] OR;  Service:  Radiology;  Laterality: N/A;   SUPRACLAVICAL NODE BIOPSY Left 05/08/2019   Procedure: SUPRACLAVICAL NODE BIOPSY;  Surgeon: Loreli Slot, MD;  Location: [facility] OR;  Service: Thoracic;  Laterality: Left;       Home Medications    Prior to Admission medications   Medication Sig Start Date End Date Taking? Authorizing Provider  sulfamethoxazole-trimethoprim (BACTRIM DS) 800-160 MG tablet Take 1 tablet by mouth 2 (two) times daily for 7 days. 05/29/23 06/05/23 Yes Gustavus Bryant, FNP  aspirin EC 81 MG tablet Take 1 tablet (81 mg total) by mouth daily. 03/02/21   Almon Hercules, MD   aspirin EC 81 MG tablet Take 1 tablet (81 mg total) by mouth daily. Swallow whole. 08/18/22   Kara Mead, NP  atorvastatin (LIPITOR) 80 MG tablet Take 1 tablet (80 mg total) by mouth at bedtime. 08/17/22   Kara Mead, NP  benzonatate (TESSALON) 100 MG capsule Take 1 capsule (100 mg total) by mouth every 8 (eight) hours. 03/04/22   Peter Garter, PA  clopidogrel (PLAVIX) 75 MG tablet Take 1 tablet (75 mg total) by mouth daily. 08/17/22   Kara Mead, NP  rosuvastatin (CRESTOR) 20 MG tablet Take 1 tablet (20 mg total) by mouth daily. 03/02/21   Almon Hercules, MD    Family History Family History  Family history unknown: Yes    Social History Social History   Tobacco Use   Smoking status: Every Day    Current packs/day: 0.30    Types: Cigarettes   Smokeless tobacco: Current   Tobacco comments:    has smoked for 40+ yrs - at most 1ppd, currently 1ppwk.  Vaping Use   Vaping status: Never Used  Substance Use Topics   Alcohol use: Not Currently    Comment: quite > 30 yrs ago.   Drug use: Not Currently    Types: Cocaine    Comment: prev used cocaine - quit early 2019.     Allergies   Patient has no known allergies.   Review of Systems Review of Systems Per HPI  Physical Exam Triage Vital Signs ED Triage Vitals  Encounter Vitals Group     BP 05/29/23 0913 117/74     Systolic BP Percentile --      Diastolic BP Percentile --      Pulse Rate 05/29/23 0913 67     Resp 05/29/23 0913 16     Temp 05/29/23 0913 97.7 F (36.5 C)     Temp Source 05/29/23 0913 Oral     SpO2 05/29/23 0913 96 %     Weight 05/29/23 0912 116 lb 10 oz (52.9 kg)     Height 05/29/23 0912 5\' 6"  (1.676 m)     Head Circumference --      Peak Flow --      Pain Score 05/29/23 0912 6     Pain Loc --      Pain Education --      Exclude from Growth Chart --    No data found.  Updated Vital Signs BP 117/74 (BP Location: Left Arm)   Pulse 67   Temp 97.7 F (36.5 C) (Oral)   Resp  16   Ht 5\' 6"  (1.676 m)   Wt 116 lb 10 oz (52.9 kg)   SpO2 96%   BMI 18.82 kg/m   Visual Acuity Right Eye Distance:   Left Eye Distance:   Bilateral Distance:    Right Eye Near:  Left Eye Near:    Bilateral Near:     Physical Exam Constitutional:      General: He is not in acute distress.    Appearance: Normal appearance. He is not toxic-appearing or diaphoretic.  HENT:     Head: Normocephalic and atraumatic.  Eyes:     Extraocular Movements: Extraocular movements intact.     Conjunctiva/sclera: Conjunctivae normal.  Pulmonary:     Effort: Pulmonary effort is normal.  Neurological:     General: No focal deficit present.     Mental Status: He is alert and oriented to person, place, and time. Mental status is at baseline.  Psychiatric:        Mood and Affect: Mood normal.        Behavior: Behavior normal.        Thought Content: Thought content normal.        Judgment: Judgment normal.      UC Treatments / Results  Labs (all labs ordered are listed, but only abnormal results are displayed) Labs Reviewed  POCT URINALYSIS DIP (MANUAL ENTRY) - Abnormal; Notable for the following components:      Result Value   Clarity, UA cloudy (*)    Blood, UA small (*)    Protein Ur, POC =100 (*)    Leukocytes, UA Moderate (2+) (*)    All other components within normal limits  URINE CULTURE    EKG   Radiology No results found.  Procedures Procedures (including critical care time)  Medications Ordered in UC Medications - No data to display  Initial Impression / Assessment and Plan / UC Course  I have reviewed the triage vital signs and the nursing notes.  Pertinent labs & imaging results that were available during my care of the patient were reviewed by me and considered in my medical decision making (see chart for details).     UA indicating urinary tract infection.  Will treat with Bactrim.  Creatinine clearance is 54 so no dosage adjustment necessary.  Urine  culture pending.  Advised strict follow-up if any symptoms persist or worsen.  Patient verbalized understanding and was agreeable with plan. Final Clinical Impressions(s) / UC Diagnoses   Final diagnoses:  Acute cystitis with hematuria  Urinary frequency     Discharge Instructions      It appears that you may have a urinary tract infection so I have prescribed an antibiotic to help treat this.  Urine culture is pending.  Please follow-up if any symptoms persist or worsen.    ED Prescriptions     Medication Sig Dispense Auth. Provider   sulfamethoxazole-trimethoprim (BACTRIM DS) 800-160 MG tablet Take 1 tablet by mouth 2 (two) times daily for 7 days. 14 tablet [place], [place]ie [place], [place]      PDMP not reviewed this encounter.   Gustavus Bryant, [place] 05/29/23 1016

## 2023-05-29 NOTE — ED Triage Notes (Signed)
 Pt presents with UTI symptoms that onset about a month ago.

## 2023-05-30 LAB — URINE CULTURE: Culture: <10000 — AB

## 2023-06-14 ENCOUNTER — Ambulatory Visit
Admission: RE | Admit: 2023-06-14 | Discharge: 2023-06-14 | Disposition: A | Discharge status: Home / Self Care | Source: Ambulatory Visit | Attending: Physician Assistant | Admitting: Physician Assistant

## 2023-06-14 VITALS — BP 108/66 | HR 72 | Temp 97.9°F | Resp 14

## 2023-06-14 DIAGNOSIS — R590 Localized enlarged lymph nodes: Secondary | ICD-10-CM | POA: Diagnosis not present

## 2023-06-14 DIAGNOSIS — C8333 Diffuse large B-cell lymphoma, intra-abdominal lymph nodes: Secondary | ICD-10-CM | POA: Diagnosis not present

## 2023-06-14 DIAGNOSIS — R339 Retention of urine, unspecified: Secondary | ICD-10-CM | POA: Diagnosis not present

## 2023-06-14 LAB — POCT URINALYSIS DIP (MANUAL ENTRY)
Bilirubin, UA: NEGATIVE
Glucose, UA: NEGATIVE mg/dL
Ketones, POC UA: NEGATIVE mg/dL
Nitrite, UA: NEGATIVE
Protein Ur, POC: 30 mg/dL — AB
Spec Grav, UA: 1.02 (ref 1.010–1.025)
Urobilinogen, UA: 0.2 U/dL
pH, UA: 7.5 (ref 5.0–8.0)

## 2023-06-14 NOTE — ED Provider Notes (Signed)
 [facility]    CSN: [id number] Arrival date & time: 06/14/23  0843      History   Chief Complaint Chief Complaint  Patient presents with   Dysuria    HPI [name] is a 68 y.o. male.   Patient presents to urgent care today for evaluation of dysuria, urinary frequency and urgency with urinary retention that is been ongoing for the last month.  He was treated to cover UTI with Bactrim without improvement.  He has not had any flank pain or abdominal pain.  Urine culture was negative earlier this month.  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    The history is provided by the patient.  Dysuria Presenting symptoms: dysuria   Associated symptoms: urinary frequency   Associated symptoms: no abdominal pain, no fever, no nausea and no vomiting     Past Medical History:  Diagnosis Date   CAD (coronary artery disease)    a. 04/2008 Cath: LM nl, LAD 50p, 55m, LCX min irregs, RCA 50p/m, 40d, RPDA 60-70ost; b. 04/2008 CABG x 4: LIMA->LAD, VG->D2, VG->RPDA->RPL.   Cancer (HCC)    Cocaine abuse (HCC)    a. Quit early 2019.   Ischemic cardiomyopathy    a. LV gram: EF 30-35%, apical/periapical AK.   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   Acute on chronic HFrEF (heart failure with reduced ejection fraction) (HCC) 08/15/2022   Stroke (cerebrum) (HCC) 08/14/2022   Middle cerebral artery embolism, left 08/14/2022   Left leg weakness 03/02/2021   Chronic systolic CHF (congestive heart failure) (HCC) 03/02/2021   Syncope 03/01/2021   Port-A-Cath in place 09/09/2019   Cancer related pain 07/01/2019   Non-Hodgkin lymphoma (HCC) 05/13/2019    Goals of care, counseling/discussion 05/13/2019   Encounter for antineoplastic chemotherapy 05/13/2019   Mass of upper lobe of right lung 04/03/2019   Lymphadenopathy, inguinal 04/03/2019   Splenomegaly 04/03/2019   Lung mass 03/24/2019   Slow transit constipation    Inguinal mass    Chronic combined systolic and diastolic CHF (congestive heart failure) (HCC)    Mass of left inguinal region    Greater trochanteric bursitis of left hip    Hyponatremia    Acute blood loss anemia    Chronic combined systolic (congestive) and diastolic (congestive) heart failure (HCC)    Essential hypertension    Left leg pain    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   Left hemiparesis (HCC) 03/06/2019   Acute cerebrovascular accident (CVA) (HCC) 03/04/2019   Chest pain 10/16/2017   Hyperlipidemia 06/16/2008   TOBACCO ABUSE 06/16/2008   Cannabis abuse 06/16/2008   Cocaine abuse (HCC) 06/16/2008   CAD, ARTERY BYPASS GRAFT 04/30/2008    Past Surgical History:  Procedure Laterality Date   BYPASS GRAFT  2010   CARDIAC SURGERY  2010   IR ANGIO INTRA EXTRACRAN SEL COM CAROTID INNOMINATE UNI R MOD SED  08/14/2022   IR ANGIO VERTEBRAL SEL SUBCLAVIAN INNOMINATE UNI R MOD SED  08/14/2022   IR CT HEAD LTD  08/14/2022   IR IMAGING GUIDED PORT INSERTION  05/26/2019   IR PERCUTANEOUS ART THROMBECTOMY/INFUSION INTRACRANIAL INC DIAG  ANGIO  08/14/2022   IR REMOVAL TUN ACCESS W/ PORT W/O FL MOD SED  07/26/2022   RADIOLOGY WITH ANESTHESIA N/A 08/14/2022   Procedure: IR WITH ANESTHESIA;  Surgeon: Julieanne Cotton, MD;  Location: [facility] OR;  Service: Radiology;  Laterality: N/A;   SUPRACLAVICAL NODE BIOPSY Left 05/08/2019   Procedure: SUPRACLAVICAL NODE BIOPSY;  Surgeon: Loreli Slot, MD;  Location: [facility] OR;  Service: Thoracic;  Laterality: Left;       Home Medications    Prior to Admission medications   Medication Sig Start Date End Date Taking? Authorizing Provider  aspirin EC 81 MG tablet Take 1  tablet (81 mg total) by mouth daily. 03/02/21  Yes Almon Hercules, MD  atorvastatin (LIPITOR) 80 MG tablet Take 1 tablet (80 mg total) by mouth at bedtime. 08/17/22  Yes Kara Mead, NP  clopidogrel (PLAVIX) 75 MG tablet Take 1 tablet (75 mg total) by mouth daily. 08/17/22  Yes Kara Mead, NP  aspirin EC 81 MG tablet Take 1 tablet (81 mg total) by mouth daily. Swallow whole. 08/18/22   Kara Mead, NP  benzonatate (TESSALON) 100 MG capsule Take 1 capsule (100 mg total) by mouth every 8 (eight) hours. Patient not taking: Reported on 06/14/2023 03/04/22   Sherian Maroon A, PA  rosuvastatin (CRESTOR) 20 MG tablet Take 1 tablet (20 mg total) by mouth daily. Patient not taking: Reported on 06/14/2023 03/02/21   Almon Hercules, MD    Family History Family History  Family history unknown: Yes    Social History Social History   Tobacco Use   Smoking status: Some Days    Current packs/day: 0.30    Types: Cigarettes   Smokeless tobacco: Current   Tobacco comments:    has smoked for 40+ yrs - at most 1ppd, currently 1ppwk.  Vaping Use   Vaping status: Never Used  Substance Use Topics   Alcohol use: Not Currently    Comment: quit > 30 yrs ago.   Drug use: Not Currently    Types: Cocaine    Comment: prev used cocaine - quit early 2019.     Allergies   Patient has no known allergies.   Review of Systems Review of Systems  Constitutional:  Negative for chills and fever.  Eyes:  Negative for discharge and redness.  Respiratory:  Negative for shortness of breath.   Gastrointestinal:  Negative for abdominal pain, nausea and vomiting.  Genitourinary:  Positive for difficulty urinating, dysuria, frequency and urgency.     Physical Exam Triage Vital Signs ED Triage Vitals [06/14/23 0906]  Encounter Vitals Group     BP      Systolic BP Percentile      Diastolic BP Percentile      Pulse Rate 72     Resp 14     Temp 97.9 F (36.6 C)     Temp Source Oral     SpO2 95 %      Weight      Height      Head Circumference      Peak Flow      Pain Score 0     Pain Loc      Pain Education      Exclude from Growth Chart    No data found.  Updated Vital Signs Pulse 72   Temp 97.9 F (36.6 C) (Oral)   Resp 14   SpO2 95%   Visual Acuity Right Eye Distance:   Left  Eye Distance:   Bilateral Distance:    Right Eye Near:   Left Eye Near:    Bilateral Near:     Physical Exam Vitals and nursing note reviewed.  Constitutional:      General: He is not in acute distress.    Appearance: He is not ill-appearing.     Comments: Appears frail  HENT:     Head: Normocephalic and atraumatic.  Eyes:     Conjunctiva/sclera: Conjunctivae normal.  Cardiovascular:     Rate and Rhythm: Normal rate.  Pulmonary:     Effort: Pulmonary effort is normal. No respiratory distress.  Neurological:     Mental Status: He is alert.  Psychiatric:        Mood and Affect: Mood normal.      UC Treatments / Results  Labs (all labs ordered are listed, but only abnormal results are displayed) Labs Reviewed  POCT URINALYSIS DIP (MANUAL ENTRY) - Abnormal; Notable for the following components:      Result Value   Clarity, UA cloudy (*)    Blood, UA trace-intact (*)    Protein Ur, POC =30 (*)    Leukocytes, UA Moderate (2+) (*)    All other components within normal limits    EKG   Radiology No results found.  Procedures Procedures (including critical care time)  Medications Ordered in UC Medications - No data to display  Initial Impression / Assessment and Plan / UC Course  I have reviewed the triage vital signs and the nursing notes.  Pertinent labs & imaging results that were available during my care of the patient were reviewed by me and considered in my medical decision making (see chart for details).    Recommended further evaluation in the emergency room for stat imaging given urinary retention.  Patient is agreeable and niece who is with him will  transport via POV.  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   Tomi Bamberger, PA-C 06/14/23 [phone]

## 2023-06-14 NOTE — ED Triage Notes (Signed)
 Pt reports dysuria, frequency, urgency, and possible urinary retention for over a month. Previously seen at [facility] and treated with Bactrim but reports no relief of symptoms. Pt denies flank or abdominal pain. Reports he feels like he needs to go often still, but has problems trying to go now. +

## 2023-06-14 NOTE — ED Notes (Signed)
 Patient is being discharged from the Urgent Care and sent to the Emergency Department via Private Vehicle . Per Provider, patient is in need of higher level of care due to Urinary Retention-Imaging. Patient is aware and verbalizes understanding of plan of care.  Vitals:   06/14/23 0906  Pulse: 72  Resp: 14  Temp: 97.9 F (36.6 C)  SpO2: 95%

## 2023-09-22 ENCOUNTER — Encounter (HOSPITAL_COMMUNITY): Payer: Self-pay | Admitting: Interventional Radiology

## 2023-10-26 ENCOUNTER — Encounter: Payer: Self-pay | Admitting: Internal Medicine

## 2023-10-29 ENCOUNTER — Inpatient Hospital Stay: Payer: Medicare Other | Attending: Internal Medicine

## 2023-10-29 ENCOUNTER — Inpatient Hospital Stay

## 2023-10-29 ENCOUNTER — Ambulatory Visit (HOSPITAL_COMMUNITY): Admission: RE | Admit: 2023-10-29 | Source: Ambulatory Visit

## 2023-10-29 DIAGNOSIS — N401 Enlarged prostate with lower urinary tract symptoms: Secondary | ICD-10-CM | POA: Insufficient documentation

## 2023-10-29 DIAGNOSIS — I959 Hypotension, unspecified: Secondary | ICD-10-CM | POA: Insufficient documentation

## 2023-10-29 DIAGNOSIS — Z9221 Personal history of antineoplastic chemotherapy: Secondary | ICD-10-CM | POA: Insufficient documentation

## 2023-10-29 DIAGNOSIS — C833A Diffuse large b-cell lymphoma, in remission: Secondary | ICD-10-CM | POA: Insufficient documentation

## 2023-11-01 ENCOUNTER — Telehealth: Payer: Self-pay

## 2023-11-01 NOTE — Telephone Encounter (Signed)
 Spoke with patient this afternoon regarding his upcoming appointment on 11/05/23. Informed patient that he has not yet completed his CT scan. Advised him to call the cancer center to schedule a follow-up visit with Dr. Sherrod approximately one week after the scan is performed.  Provided the patient with Centralized Scheduling's phone number: [phone]. Patient voiced understanding.

## 2023-11-05 ENCOUNTER — Inpatient Hospital Stay: Payer: Medicare Other | Admitting: Internal Medicine

## 2023-11-05 ENCOUNTER — Inpatient Hospital Stay

## 2023-11-05 ENCOUNTER — Telehealth: Payer: Self-pay | Admitting: Internal Medicine

## 2023-11-05 NOTE — Telephone Encounter (Signed)
 Left the patient niece a voicemail with the rescheduled appointment details.

## 2023-11-13 ENCOUNTER — Inpatient Hospital Stay

## 2023-11-13 ENCOUNTER — Ambulatory Visit (HOSPITAL_COMMUNITY)
Admission: RE | Admit: 2023-11-13 | Discharge: 2023-11-13 | Disposition: A | Discharge status: Home / Self Care | Source: Ambulatory Visit | Attending: Internal Medicine | Admitting: Internal Medicine

## 2023-11-13 DIAGNOSIS — C8333 Diffuse large B-cell lymphoma, intra-abdominal lymph nodes: Secondary | ICD-10-CM | POA: Diagnosis present

## 2023-11-13 DIAGNOSIS — C833A Diffuse large b-cell lymphoma, in remission: Secondary | ICD-10-CM | POA: Diagnosis present

## 2023-11-13 DIAGNOSIS — Z9221 Personal history of antineoplastic chemotherapy: Secondary | ICD-10-CM | POA: Diagnosis not present

## 2023-11-13 DIAGNOSIS — I959 Hypotension, unspecified: Secondary | ICD-10-CM | POA: Diagnosis not present

## 2023-11-13 DIAGNOSIS — N401 Enlarged prostate with lower urinary tract symptoms: Secondary | ICD-10-CM | POA: Diagnosis not present

## 2023-11-13 LAB — CMP (CANCER CENTER ONLY)
ALT: 9 U/L (ref 0–44)
AST: 10 U/L — ABNORMAL LOW (ref 15–41)
Albumin: 4.1 g/dL (ref 3.5–5.0)
Alkaline Phosphatase: 58 U/L (ref 38–126)
Anion gap: 6 (ref 5–15)
BUN: 29 mg/dL — ABNORMAL HIGH (ref 8–23)
CO2: 27 mmol/L (ref 22–32)
Calcium: 9.3 mg/dL (ref 8.9–10.3)
Chloride: 105 mmol/L (ref 98–111)
Creatinine: 1.48 mg/dL — ABNORMAL HIGH (ref 0.61–1.24)
GFR, Estimated: 51 mL/min — ABNORMAL LOW (ref >60)
Glucose, Bld: 89 mg/dL (ref 70–99)
Potassium: 4.1 mmol/L (ref 3.5–5.1)
Sodium: 138 mmol/L (ref 135–145)
Total Bilirubin: 0.7 mg/dL (ref 0.0–1.2)
Total Protein: 7.4 g/dL (ref 6.5–8.1)

## 2023-11-13 LAB — CBC WITH DIFFERENTIAL (CANCER CENTER ONLY)
Abs Immature Granulocytes: 0.01 K/uL (ref 0.00–0.07)
Basophils Absolute: 0 K/uL (ref 0.0–0.1)
Basophils Relative: 1 %
Eosinophils Absolute: 0 K/uL (ref 0.0–0.5)
Eosinophils Relative: 1 %
HCT: 42.3 % (ref 39.0–52.0)
Hemoglobin: 14.5 g/dL (ref 13.0–17.0)
Immature Granulocytes: 0 %
Lymphocytes Relative: 26 %
Lymphs Abs: 1 K/uL (ref 0.7–4.0)
MCH: 32.7 pg (ref 26.0–34.0)
MCHC: 34.3 g/dL (ref 30.0–36.0)
MCV: 95.5 fL (ref 80.0–100.0)
Monocytes Absolute: 0.3 K/uL (ref 0.1–1.0)
Monocytes Relative: 7 %
Neutro Abs: 2.6 K/uL (ref 1.7–7.7)
Neutrophils Relative %: 65 %
Platelet Count: 239 K/uL (ref 150–400)
RBC: 4.43 MIL/uL (ref 4.22–5.81)
RDW: 13.2 % (ref 11.5–15.5)
WBC Count: 3.9 K/uL — ABNORMAL LOW (ref 4.0–10.5)
nRBC: 0 % (ref 0.0–0.2)

## 2023-11-13 LAB — LACTATE DEHYDROGENASE: LDH: 109 U/L (ref 98–192)

## 2023-11-13 MED ORDER — IOHEXOL 300 MG/ML  SOLN
100.0000 mL | Freq: Once | INTRAMUSCULAR | Status: AC | PRN
  Administered 2023-11-13: 100 mL via INTRAVENOUS

## 2023-11-21 ENCOUNTER — Telehealth: Payer: Self-pay | Admitting: Medical Oncology

## 2023-11-21 ENCOUNTER — Inpatient Hospital Stay (HOSPITAL_BASED_OUTPATIENT_CLINIC_OR_DEPARTMENT_OTHER): Admitting: Internal Medicine

## 2023-11-21 VITALS — BP 84/59 | HR 66 | Temp 97.7°F | Resp 17 | Ht 67.0 in | Wt 105.5 lb

## 2023-11-21 DIAGNOSIS — C833A Diffuse large b-cell lymphoma, in remission: Secondary | ICD-10-CM | POA: Diagnosis not present

## 2023-11-21 DIAGNOSIS — R590 Localized enlarged lymph nodes: Secondary | ICD-10-CM | POA: Diagnosis not present

## 2023-11-21 NOTE — Progress Notes (Signed)
  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    PRIOR THERAPY: Systemic chemotherapy with R-CHOP every 3 weeks.  First dose May 20, 2019.  Status post 8 cycles.  Last dose was given October 14, 2019.  CURRENT THERAPY: Observation.  INTERVAL HISTORY: [name] 68 y.o. male to the clinic today for 6 months follow-up visit. Discussed the use of AI scribe software for clinical note transcription with the patient, who gave verbal consent to proceed.  History of Present Illness [name] is a 68 year old male with stage four large B cell non-Hodgkin lymphoma who presents for evaluation with repeat blood work and CT scan for restaging of his disease.  He was diagnosed with stage four large B cell non-Hodgkin lymphoma in February 2021 and completed eight cycles of systemic chemotherapy with R-CHOP, with the last dose administered in July 2021. Since then, he has been under observation and is currently undergoing evaluation with repeat blood work and a CT scan of the chest, abdomen, and pelvis for restaging of his disease.  He experiences increased urinary frequency, stating 'I've been urinating a lot.' He has not yet consulted a urologist for this issue.  No recent weight loss, night sweats, or swelling of glands. He feels generally 'all right' aside from the urinary symptoms. No shortness of breath or coughing.     MEDICAL HISTORY: Past Medical History:  Diagnosis Date   CAD (coronary artery disease)    a.  04/2008 Cath: LM nl, LAD 50p, 25m, LCX min irregs, RCA 50p/m, 40d, RPDA 60-70ost; b. 04/2008 CABG x 4: LIMA->LAD, VG->D2, VG->RPDA->RPL.   Cancer (HCC)    Cocaine abuse (HCC)    a. Quit early 2019.   Ischemic cardiomyopathy    a. LV gram: EF 30-35%, apical/periapical AK.   Stroke [facility]) 02/2019   Stroke (HCC) 02/2019   Tobacco abuse     ALLERGIES:  has no known allergies.  MEDICATIONS:  Current Outpatient Medications  Medication Sig Dispense Refill   aspirin  EC 81 MG tablet Take 1 tablet (81 mg total) by mouth daily. 30 tablet 3   aspirin  EC 81 MG tablet Take 1 tablet (81 mg total) by mouth daily. Swallow whole. 30 tablet 12   atorvastatin  (LIPITOR ) 80 MG tablet Take 1 tablet (80 mg total) by mouth at bedtime. 30 tablet 1   benzonatate  (TESSALON ) 100 MG capsule Take 1 capsule (100 mg total) by mouth every 8 (eight) hours. (Patient not taking: Reported on 06/14/2023) 21 capsule 0   clopidogrel  (PLAVIX ) 75 MG tablet Take 1 tablet (75 mg total) by mouth daily. 90 tablet 0   rosuvastatin  (CRESTOR ) 20 MG tablet Take 1 tablet (20 mg total) by mouth daily. (Patient not taking: Reported on 06/14/2023) 90 tablet 3   No current facility-administered medications for this visit.   Facility-Administered Medications Ordered in Other Visits  Medication Dose Route Frequency Provider Last Rate Last Admin   heparin  lock flush 100 unit/mL  500 Units Intracatheter Once Sherrod Sherrod, MD  heparin  lock flush 100 unit/mL  500 Units Intracatheter Once Sherrod Sherrod, MD       sodium chloride  flush (NS) 0.9 % injection 10 mL  10 mL Intracatheter Once Sherrod Sherrod, MD       sodium chloride  flush (NS) 0.9 % injection 10 mL  10 mL Intracatheter Once Sherrod Sherrod, MD        SURGICAL HISTORY:  Past Surgical History:  Procedure Laterality Date   BYPASS GRAFT  2010   CARDIAC SURGERY  2010   IR ANGIO INTRA EXTRACRAN SEL COM CAROTID INNOMINATE UNI R MOD SED  08/14/2022   IR ANGIO VERTEBRAL SEL  SUBCLAVIAN INNOMINATE UNI R MOD SED  08/14/2022   IR CT HEAD LTD  08/14/2022   IR IMAGING GUIDED PORT INSERTION  05/26/2019   IR PERCUTANEOUS ART THROMBECTOMY/INFUSION INTRACRANIAL INC DIAG ANGIO  08/14/2022   IR REMOVAL TUN ACCESS W/ PORT W/O FL MOD SED  07/26/2022   RADIOLOGY WITH ANESTHESIA N/A 08/14/2022   Procedure: IR WITH ANESTHESIA;  Surgeon: Dolphus Carrion, MD;  Location: [facility] OR;  Service: Radiology;  Laterality: N/A;   SUPRACLAVICAL NODE BIOPSY Left 05/08/2019   Procedure: SUPRACLAVICAL NODE BIOPSY;  Surgeon: Kerrin Elspeth BROCKS, MD;  Location: [facility] OR;  Service: Thoracic;  Laterality: Left;    REVIEW OF SYSTEMS:  Constitutional: positive for fatigue and weight loss Eyes: negative Ears, nose, mouth, throat, and face: negative Respiratory: negative Cardiovascular: negative Gastrointestinal: negative Genitourinary:positive for frequency Integument/breast: negative Hematologic/lymphatic: negative Musculoskeletal:negative Neurological: negative Behavioral/Psych: negative Endocrine: negative Allergic/Immunologic: negative   PHYSICAL EXAMINATION: General appearance: alert, cooperative, fatigued, and no distress Head: Normocephalic, without obvious abnormality, atraumatic Neck: no adenopathy, no JVD, supple, symmetrical, trachea midline, and thyroid not enlarged, symmetric, no tenderness/mass/nodules Lymph nodes: Cervical, supraclavicular, and axillary nodes normal. Resp: clear to auscultation bilaterally Back: symmetric, no curvature. ROM normal. No CVA tenderness. Cardio: regular rate and rhythm, S1, S2 normal, no murmur, click, rub or gallop GI: soft, non-tender; bowel sounds normal; no masses,  no organomegaly Extremities: extremities normal, atraumatic, no cyanosis or edema Neurologic: Alert and oriented X 3, normal strength and tone. Normal symmetric reflexes. Normal coordination and gait  ECOG PERFORMANCE STATUS: 1 - Symptomatic but completely ambulatory  Blood pressure (!)  84/59, pulse 66, temperature 97.7 F (36.5 C), resp. rate 17, height 5' 7 (1.702 m), weight 105 lb 8 oz (47.9 kg), SpO2 100%.  LABORATORY DATA: Lab Results  Component Value Date   WBC 3.9 (L) 11/13/2023   HGB 14.5 11/13/2023   HCT 42.3 11/13/2023   MCV 95.5 11/13/2023   PLT 239 11/13/2023      Chemistry      Component Value Date/Time   NA 138 11/13/2023 1307   K 4.1 11/13/2023 1307   CL 105 11/13/2023 1307   CO2 27 11/13/2023 1307   BUN 29 (H) 11/13/2023 1307   CREATININE 1.48 (H) 11/13/2023 1307      Component Value Date/Time   CALCIUM  9.3 11/13/2023 1307   ALKPHOS 58 11/13/2023 1307   AST 10 (L) 11/13/2023 1307   ALT 9 11/13/2023 1307   BILITOT 0.7 11/13/2023 1307       RADIOGRAPHIC STUDIES: CT CHEST ABDOMEN PELVIS W CONTRAST Result Date: 11/20/2023 CLINICAL DATA:  Stage 4 non-Hodgkins lymphoma. * Tracking Code: BO * EXAM: CT CHEST, ABDOMEN, AND PELVIS WITH CONTRAST TECHNIQUE: Multidetector CT imaging of the chest, abdomen and pelvis was performed following the standard protocol during bolus administration of intravenous contrast. RADIATION DOSE REDUCTION: This exam  was performed according to the departmental dose-optimization program which includes automated exposure control, adjustment of the mA and/or kV according to patient size and/or use of iterative reconstruction technique. CONTRAST:  OMNIPAQUE  IOHEXOL  300 MG/ML  SOLN COMPARISON:  Multiple priors including CT June 06 2022. FINDINGS: CT CHEST FINDINGS Cardiovascular: Aortic atherosclerosis. Prior median sternotomy and CABG. Calcifications of the native coronary vessels. Normal size heart. Thinning of the anterolateral left ventricular free wall may reflects sequela sequela prior infarction. Mediastinum/Nodes: No suspicious thyroid nodules. No pathologically enlarged mediastinal, hilar or axillary lymph nodes. Mild diffuse esophageal wall thickening. Lungs/Pleura: Stable scattered tiny bilateral pulmonary nodules.  No new suspicious pulmonary nodules or masses. Selective indexed nodules are as follows: -left upper lobe pulmonary nodule measures 6 mm on image 89/7, unchanged. -subpleural left upper lobe pulmonary nodule measures 4 mm on image 78/7 previously 5 mm Mild paraseptal emphysema. Central airways are patent. Biapical pleuroparenchymal scarring. Scarring with volume loss in the right middle lobe. Musculoskeletal: No aggressive lytic or blastic lesion of bone. Prior median sternotomy. CT ABDOMEN PELVIS FINDINGS Hepatobiliary: No suspicious hepatic lesion. Gallbladder is unremarkable. No biliary ductal dilation. Pancreas: Similar prominence of the pancreatic duct. No evidence of acute pancreatic inflammation. Spleen: Similar lobular splenic contour.  No splenomegaly. Adrenals/Urinary Tract: No suspicious adrenal nodule/mass. No hydronephrosis. Kidneys demonstrate symmetric enhancement. Urinary bladder diverticulum. Stomach/Bowel: Stomach is minimally distended limiting evaluation. No pathologic dilation of small or large bowel. Vascular/Lymphatic: Normal caliber abdominal aorta. Smooth IVC contours. The portal, splenic and superior mesenteric veins are patent. Prominent left inguinal lymph node measures 11 mm on image 105/3, unchanged. No pathologically enlarged abdominal or pelvic lymph nodes. Reproductive: Enlarged prostate gland. Other: No significant abdominopelvic free fluid. Musculoskeletal: No aggressive lytic or blastic lesion of bone. Multilevel degenerative changes of the spine. Degenerative change of the left hip. Similar degenerative change and chronic irregularity of the right hip related to prior trauma. IMPRESSION: 1. No pathologically enlarged lymph nodes in the chest, abdomen or pelvis. 2. Stable scattered tiny bilateral pulmonary nodules. No new suspicious pulmonary nodules or masses. 3. Mild diffuse esophageal wall thickening, which may reflect esophagitis. 4. Enlarged prostate gland with urinary  bladder diverticulum, suggestive of chronic outflow impedance. Aortic Atherosclerosis (ICD10-I70.0) and Emphysema (ICD10-J43.9). Electronically Signed   By: Reyes Holder M.D.   On: 11/20/2023 16:58     ASSESSMENT AND PLAN: This is a 68 years old African-American male recently diagnosed with 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  The patient completed course of systemic chemotherapy with R-CHOP status post 8 cycles.  He tolerated his treatment fairly well with no significant adverse effects. The patient has been on observation since August 2021. He had repeat CT scan of the chest, abdomen and pelvis performed recently.  I personally independently reviewed the scan and discussed the result with the patient today.  His scan showed no concerning findings for recurrent lymphoma but there was enlarged prostate gland with urinary bladder diverticulum that could be responsible for his increased urine frequency. Assessment and Plan Assessment & Plan Diffuse large B-cell lymphoma, post-chemotherapy, under observation Stage IV diffuse large B-cell lymphoma, diagnosed February 2021. Completed eight cycles of R-CHOP chemotherapy, last dose July 2021. Currently asymptomatic with no weight loss, night sweats, or lymphadenopathy. Recent CT scan performed for restaging. - Continue observation  Benign prostatic hyperplasia with lower urinary tract symptoms Increased urinary frequency and imaging shows an enlarged prostate, consistent with benign prostatic hyperplasia. - Refer to urology for further evaluation and management  Hypotension Low blood pressure noted during the visit. - Provide hydration before discharge The patient was advised to call immediately if she has any concerning  symptoms in the interval. The total time spent in the appointment was 30 minutes including review of chart and various tests results, discussions about plan of care and coordination of care plan .  The patient voices understanding of current disease status and treatment options and is in agreement with the current care plan.  All questions were answered. The patient knows to call the clinic with any problems, questions or concerns. We can certainly see the patient much sooner if necessary.   Disclaimer: This note was dictated with voice recognition software. Similar sounding words can inadvertently be transcribed and may not be corrected upon review.

## 2023-11-21 NOTE — Telephone Encounter (Signed)
 Referral to [facility] .Demographics , progress note and scan faxed to [facility].

## 2023-11-22 ENCOUNTER — Telehealth: Payer: Self-pay | Admitting: Internal Medicine

## 2023-11-22 NOTE — Telephone Encounter (Signed)
 Scheduled appointments per 8/27 los. Talked with the patient and he is aware of the made appointments.

## 2023-12-25 ENCOUNTER — Telehealth: Payer: Self-pay | Admitting: *Deleted

## 2023-12-25 NOTE — Telephone Encounter (Signed)
 Lucienne (pt's niece - emergency contact) called and left following VM: Mr. Fick told her he was referred to a urologist by this office and had made an appt and remembered it was this Friday. He did not remember the name of the office or the time of the appt and she is providing transportation. She asked if the office would call her with the name of the office, their number and the time of the appt if this office knows.  Review of chart shows patient referred to [facility] 11/21/23. Attempted to contact [facility], however phone service was disrupted and only contact option at 1530 was for emergency services after hours.   Contacted [name] and gave her number for [facility]. She stated she would call them in the morning.

## 2024-02-23 ENCOUNTER — Other Ambulatory Visit: Payer: Self-pay

## 2024-02-23 ENCOUNTER — Encounter (HOSPITAL_COMMUNITY): Payer: Self-pay | Admitting: Emergency Medicine

## 2024-02-23 ENCOUNTER — Emergency Department (HOSPITAL_COMMUNITY)

## 2024-02-23 ENCOUNTER — Emergency Department (HOSPITAL_COMMUNITY)
Admission: EM | Admit: 2024-02-23 | Discharge: 2024-02-23 | Disposition: A | Discharge status: Home / Self Care | Attending: Emergency Medicine | Admitting: Emergency Medicine

## 2024-02-23 DIAGNOSIS — I2510 Atherosclerotic heart disease of native coronary artery without angina pectoris: Secondary | ICD-10-CM | POA: Insufficient documentation

## 2024-02-23 DIAGNOSIS — N400 Enlarged prostate without lower urinary tract symptoms: Secondary | ICD-10-CM

## 2024-02-23 DIAGNOSIS — N390 Urinary tract infection, site not specified: Secondary | ICD-10-CM | POA: Diagnosis not present

## 2024-02-23 DIAGNOSIS — Z8673 Personal history of transient ischemic attack (TIA), and cerebral infarction without residual deficits: Secondary | ICD-10-CM | POA: Diagnosis not present

## 2024-02-23 DIAGNOSIS — Z951 Presence of aortocoronary bypass graft: Secondary | ICD-10-CM | POA: Insufficient documentation

## 2024-02-23 DIAGNOSIS — Z7982 Long term (current) use of aspirin: Secondary | ICD-10-CM | POA: Diagnosis not present

## 2024-02-23 DIAGNOSIS — N401 Enlarged prostate with lower urinary tract symptoms: Secondary | ICD-10-CM | POA: Insufficient documentation

## 2024-02-23 DIAGNOSIS — Z859 Personal history of malignant neoplasm, unspecified: Secondary | ICD-10-CM | POA: Diagnosis not present

## 2024-02-23 DIAGNOSIS — F17200 Nicotine dependence, unspecified, uncomplicated: Secondary | ICD-10-CM | POA: Insufficient documentation

## 2024-02-23 DIAGNOSIS — Z7902 Long term (current) use of antithrombotics/antiplatelets: Secondary | ICD-10-CM | POA: Diagnosis not present

## 2024-02-23 DIAGNOSIS — R1084 Generalized abdominal pain: Secondary | ICD-10-CM | POA: Diagnosis present

## 2024-02-23 LAB — URINALYSIS, ROUTINE W REFLEX MICROSCOPIC
Bilirubin Urine: NEGATIVE
Glucose, UA: NEGATIVE mg/dL
Ketones, ur: NEGATIVE mg/dL
Nitrite: NEGATIVE
Protein, ur: 30 mg/dL — AB
Specific Gravity, Urine: 1.015 (ref 1.005–1.030)
WBC, UA: >50 WBC/hpf (ref 0–5)
pH: 8 (ref 5.0–8.0)

## 2024-02-23 LAB — COMPREHENSIVE METABOLIC PANEL WITH GFR
ALT: 18 U/L (ref 0–44)
AST: 24 U/L (ref 15–41)
Albumin: 4.9 g/dL (ref 3.5–5.0)
Alkaline Phosphatase: 79 U/L (ref 38–126)
Anion gap: 8 (ref 5–15)
BUN: 20 mg/dL (ref 8–23)
CO2: 29 mmol/L (ref 22–32)
Calcium: 10.3 mg/dL (ref 8.9–10.3)
Chloride: 105 mmol/L (ref 98–111)
Creatinine, Ser: 1.08 mg/dL (ref 0.61–1.24)
GFR, Estimated: >60 mL/min (ref >60)
Glucose, Bld: 102 mg/dL — ABNORMAL HIGH (ref 70–99)
Potassium: 4.1 mmol/L (ref 3.5–5.1)
Sodium: 142 mmol/L (ref 135–145)
Total Bilirubin: 0.5 mg/dL (ref 0.0–1.2)
Total Protein: 8.8 g/dL — ABNORMAL HIGH (ref 6.5–8.1)

## 2024-02-23 LAB — CBC WITH DIFFERENTIAL/PLATELET
Abs Immature Granulocytes: 0 K/uL (ref 0.00–0.07)
Basophils Absolute: 0 K/uL (ref 0.0–0.1)
Basophils Relative: 1 %
Eosinophils Absolute: 0 K/uL (ref 0.0–0.5)
Eosinophils Relative: 1 %
HCT: 46.8 % (ref 39.0–52.0)
Hemoglobin: 15.4 g/dL (ref 13.0–17.0)
Immature Granulocytes: 0 %
Lymphocytes Relative: 20 %
Lymphs Abs: 0.9 K/uL (ref 0.7–4.0)
MCH: 32.8 pg (ref 26.0–34.0)
MCHC: 32.9 g/dL (ref 30.0–36.0)
MCV: 99.8 fL (ref 80.0–100.0)
Monocytes Absolute: 0.3 K/uL (ref 0.1–1.0)
Monocytes Relative: 6 %
Neutro Abs: 3.3 K/uL (ref 1.7–7.7)
Neutrophils Relative %: 72 %
Platelets: 243 K/uL (ref 150–400)
RBC: 4.69 MIL/uL (ref 4.22–5.81)
RDW: 13 % (ref 11.5–15.5)
WBC: 4.6 K/uL (ref 4.0–10.5)
nRBC: 0 % (ref 0.0–0.2)

## 2024-02-23 LAB — LIPASE, BLOOD: Lipase: 12 U/L (ref 11–51)

## 2024-02-23 MED ORDER — SODIUM CHLORIDE 0.9 % IV SOLN: INTRAVENOUS | Status: DC

## 2024-02-23 MED ORDER — FENTANYL CITRATE (PF) 50 MCG/ML IJ SOSY
50.0000 ug | PREFILLED_SYRINGE | Freq: Once | INTRAMUSCULAR | Status: AC
  Administered 2024-02-23: 50 ug via INTRAVENOUS
  Filled 2024-02-23: qty 1

## 2024-02-23 MED ORDER — ONDANSETRON HCL 4 MG/2ML IJ SOLN
4.0000 mg | Freq: Once | INTRAMUSCULAR | Status: AC
  Administered 2024-02-23: 4 mg via INTRAVENOUS
  Filled 2024-02-23: qty 2

## 2024-02-23 MED ORDER — IOHEXOL 300 MG/ML  SOLN
100.0000 mL | Freq: Once | INTRAMUSCULAR | Status: AC | PRN
  Administered 2024-02-23: 100 mL via INTRAVENOUS

## 2024-02-23 MED ORDER — TAMSULOSIN HCL 0.4 MG PO CAPS
0.4000 mg | ORAL_CAPSULE | ORAL | Status: AC
  Administered 2024-02-23: 0.4 mg via ORAL
  Filled 2024-02-23: qty 1

## 2024-02-23 MED ORDER — CEPHALEXIN 500 MG PO CAPS: 500.0000 mg | ORAL_CAPSULE | Freq: Four times a day (QID) | ORAL | 0 refills | Status: AC

## 2024-02-23 MED ORDER — SODIUM CHLORIDE 0.9 % IV SOLN
1.0000 g | Freq: Once | INTRAVENOUS | Status: AC
  Administered 2024-02-23: 1 g via INTRAVENOUS
  Filled 2024-02-23: qty 10

## 2024-02-23 MED ORDER — TAMSULOSIN HCL 0.4 MG PO CAPS: 0.4000 mg | ORAL_CAPSULE | Freq: Every day | ORAL | 0 refills | Status: AC

## 2024-02-23 MED ORDER — ONDANSETRON 8 MG PO TBDP: ORAL_TABLET | ORAL | 0 refills | Status: AC

## 2024-02-23 MED ORDER — KETOROLAC TROMETHAMINE 30 MG/ML IJ SOLN
15.0000 mg | Freq: Once | INTRAMUSCULAR | Status: AC
  Administered 2024-02-23: 15 mg via INTRAVENOUS
  Filled 2024-02-23: qty 1

## 2024-02-23 NOTE — ED Provider Notes (Signed)
 [place] EMERGENCY DEPARTMENT AT [facility] Provider Note   CSN: [id number] Arrival date & time: 02/23/24  9658     Patient presents with: Abdominal Pain and Emesis   [name] is a 68 y.o. male.   The history is provided by the patient.  Abdominal Pain Pain location:  Generalized Pain quality: aching   Pain radiates to:  Does not radiate Pain severity:  Moderate Onset quality:  Gradual Duration:  3 days Timing:  Constant Progression:  Unchanged Chronicity:  New Context: suspicious food intake   Context: not trauma   Context comment:  Drank what he thought was expired milk Relieved by:  Nothing Worsened by:  Nothing Ineffective treatments:  None tried Associated symptoms: nausea and vomiting   Associated symptoms: no constipation, no diarrhea, no dysuria and no fever   Risk factors: no recent hospitalization   Patient with history of cancer presents with abdominal pain and emesis x 3 days.      Past Medical History:  Diagnosis Date   CAD (coronary artery disease)    a. 04/2008 Cath: LM nl, LAD 50p, 37m, LCX min irregs, RCA 50p/m, 40d, RPDA 60-70ost; b. 04/2008 CABG x 4: LIMA->LAD, VG->D2, VG->RPDA->RPL.   Cancer (HCC)    Cocaine abuse (HCC)    a. Quit early 2019.   Ischemic cardiomyopathy    a. LV gram: EF 30-35%, apical/periapical AK.   Stroke [facility]) 02/2019   Stroke (HCC) 02/2019   Tobacco abuse      Prior to Admission medications   Medication Sig Start Date End Date Taking? Authorizing Provider  cephALEXin  (KEFLEX ) 500 MG capsule Take 1 capsule (500 mg total) by mouth 4 (four) times daily. 02/23/24  Yes Gazella Anglin, MD  ondansetron  (ZOFRAN -ODT) 8 MG disintegrating tablet 8mg  ODT q4 hours prn nausea 02/23/24  Yes Tarus Briski, MD  aspirin  EC 81 MG tablet Take 1 tablet (81 mg total) by mouth daily. 03/02/21   Gonfa, Taye T, MD  aspirin  EC 81 MG tablet Take 1 tablet (81 mg total) by mouth daily. Swallow whole. 08/18/22   Toberman, Stevi W,  NP  atorvastatin  (LIPITOR ) 80 MG tablet Take 1 tablet (80 mg total) by mouth at bedtime. 08/17/22   Toberman, Stevi W, NP  benzonatate  (TESSALON ) 100 MG capsule Take 1 capsule (100 mg total) by mouth every 8 (eight) hours. Patient not taking: Reported on 06/14/2023 03/04/22   Silver Wonda LABOR, PA  clopidogrel  (PLAVIX ) 75 MG tablet Take 1 tablet (75 mg total) by mouth daily. 08/17/22   Toberman, Stevi W, NP  rosuvastatin  (CRESTOR ) 20 MG tablet Take 1 tablet (20 mg total) by mouth daily. Patient not taking: Reported on 06/14/2023 03/02/21   Gonfa, Taye T, MD    Allergies: Patient has no known allergies.    Review of Systems  Constitutional:  Negative for fever.  Gastrointestinal:  Positive for abdominal pain, nausea and vomiting. Negative for constipation and diarrhea.  Genitourinary:  Negative for dysuria and flank pain.  All other systems reviewed and are negative.   Updated Vital Signs BP (!) 145/87 (BP Location: Left Arm)   Pulse 70   Temp 98 F (36.7 C) (Oral)   Resp 20   SpO2 100%   Physical Exam Vitals and nursing note reviewed. Exam conducted with a chaperone present.  Constitutional:      General: He is not in acute distress.    Appearance: Normal appearance. He is well-developed. He is not diaphoretic.  HENT:  Head: Normocephalic and atraumatic.     Nose: Nose normal.  Eyes:     Conjunctiva/sclera: Conjunctivae normal.     Pupils: Pupils are equal, round, and reactive to light.  Cardiovascular:     Rate and Rhythm: Normal rate and regular rhythm.     Pulses: Normal pulses.     Heart sounds: Normal heart sounds.  Pulmonary:     Effort: Pulmonary effort is normal.     Breath sounds: Normal breath sounds. No wheezing or rales.  Abdominal:     General: Bowel sounds are normal.     Palpations: Abdomen is soft.     Tenderness: There is no abdominal tenderness. There is no guarding or rebound.  Musculoskeletal:        General: Normal range of motion.     Cervical  back: Normal range of motion and neck supple.  Skin:    General: Skin is warm and dry.     Capillary Refill: Capillary refill takes less than 2 seconds.  Neurological:     General: No focal deficit present.     Mental Status: He is alert and oriented to person, place, and time.     Deep Tendon Reflexes: Reflexes normal.  Psychiatric:        Mood and Affect: Mood normal.        Behavior: Behavior normal.     (all labs ordered are listed, but only abnormal results are displayed) Results for orders placed or performed during the hospital encounter of 02/23/24  CBC with Differential   Collection Time: 02/23/24  4:26 AM  Result Value Ref Range   WBC 4.6 4.0 - 10.5 K/uL   RBC 4.69 4.22 - 5.81 MIL/uL   Hemoglobin 15.4 13.0 - 17.0 g/dL   HCT 53.1 60.9 - 47.9 %   MCV 99.8 80.0 - 100.0 fL   MCH 32.8 26.0 - 34.0 pg   MCHC 32.9 30.0 - 36.0 g/dL   RDW 86.9 88.4 - 84.4 %   Platelets 243 150 - 400 K/uL   nRBC 0.0 0.0 - 0.2 %   Neutrophils Relative % 72 %   Neutro Abs 3.3 1.7 - 7.7 K/uL   Lymphocytes Relative 20 %   Lymphs Abs 0.9 0.7 - 4.0 K/uL   Monocytes Relative 6 %   Monocytes Absolute 0.3 0.1 - 1.0 K/uL   Eosinophils Relative 1 %   Eosinophils Absolute 0.0 0.0 - 0.5 K/uL   Basophils Relative 1 %   Basophils Absolute 0.0 0.0 - 0.1 K/uL   Immature Granulocytes 0 %   Abs Immature Granulocytes 0.00 0.00 - 0.07 K/uL  Comprehensive metabolic panel   Collection Time: 02/23/24  4:26 AM  Result Value Ref Range   Sodium 142 135 - 145 mmol/L   Potassium 4.1 3.5 - 5.1 mmol/L   Chloride 105 98 - 111 mmol/L   CO2 29 22 - 32 mmol/L   Glucose, Bld 102 (H) 70 - 99 mg/dL   BUN 20 8 - 23 mg/dL   Creatinine, Ser 8.91 0.61 - 1.24 mg/dL   Calcium  10.3 8.9 - 10.3 mg/dL   Total Protein 8.8 (H) 6.5 - 8.1 g/dL   Albumin 4.9 3.5 - 5.0 g/dL   AST 24 15 - 41 U/L   ALT 18 0 - 44 U/L   Alkaline Phosphatase 79 38 - 126 U/L   Total Bilirubin 0.5 0.0 - 1.2 mg/dL   GFR, Estimated >39 >39 mL/min   Anion  gap 8 5 -  15  Lipase, blood   Collection Time: 02/23/24  4:26 AM  Result Value Ref Range   Lipase 12 11 - 51 U/L  Urinalysis, Routine w reflex microscopic -Urine, Clean Catch   Collection Time: 02/23/24  5:10 AM  Result Value Ref Range   Color, Urine YELLOW YELLOW   APPearance CLOUDY (A) CLEAR   Specific Gravity, Urine 1.015 1.005 - 1.030   pH 8.0 5.0 - 8.0   Glucose, UA NEGATIVE NEGATIVE mg/dL   Hgb urine dipstick SMALL (A) NEGATIVE   Bilirubin Urine NEGATIVE NEGATIVE   Ketones, ur NEGATIVE NEGATIVE mg/dL   Protein, ur 30 (A) NEGATIVE mg/dL   Nitrite NEGATIVE NEGATIVE   Leukocytes,Ua LARGE (A) NEGATIVE   RBC / HPF 21-50 0 - 5 RBC/hpf   WBC, UA >50 0 - 5 WBC/hpf   Bacteria, UA RARE (A) NONE SEEN   Squamous Epithelial / HPF 0-5 0 - 5 /HPF   WBC Clumps PRESENT    Mucus PRESENT    Triple Phosphate Crystal PRESENT    Non Squamous Epithelial 0-5 (A) NONE SEEN   CT ABDOMEN PELVIS W CONTRAST Result Date: 02/23/2024 CLINICAL DATA:  Abdominal pain and vomiting for several days. EXAM: CT ABDOMEN AND PELVIS WITH CONTRAST TECHNIQUE: Multidetector CT imaging of the abdomen and pelvis was performed using the standard protocol following bolus administration of intravenous contrast. RADIATION DOSE REDUCTION: This exam was performed according to the departmental dose-optimization program which includes automated exposure control, adjustment of the mA and/or kV according to patient size and/or use of iterative reconstruction technique. CONTRAST:  OMNIPAQUE  IOHEXOL  300 MG/ML  SOLN COMPARISON:  11/13/2023 FINDINGS: Lower Chest: No acute findings. Hepatobiliary: No suspicious hepatic masses identified. Gallbladder is unremarkable. No evidence of biliary ductal dilatation. Pancreas: No mass or inflammatory changes. Mild diffuse pancreatic ductal dilatation shows no significant change. Spleen: Within normal limits in size. Multilobulated contour noted, but no splenic masses identified. Adrenals/Urinary  Tract: No suspicious masses identified. Mild bilateral renal parenchymal scarring is again seen. No evidence of ureteral calculi or hydronephrosis. Mild diffuse bladder wall thickening is unchanged and may be due to chronic cystitis or chronic bladder outlet obstruction. Stomach/Bowel: No evidence of obstruction, inflammatory process or abnormal fluid collections. Vascular/Lymphatic: No pathologically enlarged lymph nodes. Stable 11 mm left inguinal lymph node again noted. No acute vascular findings. Reproductive:  Stable mildly enlarged prostate. Other:  None. Musculoskeletal:  No suspicious bone lesions identified. IMPRESSION: No acute findings. Stable mildly enlarged prostate. Stable mild diffuse bladder wall thickening, which may be due to chronic cystitis or chronic bladder outlet obstruction. Suggest correlation with urinalysis. Electronically Signed   By: Norleen DELENA Kil M.D.   On: 02/23/2024 05:53    None  Radiology: CT ABDOMEN PELVIS W CONTRAST Result Date: 02/23/2024 CLINICAL DATA:  Abdominal pain and vomiting for several days. EXAM: CT ABDOMEN AND PELVIS WITH CONTRAST TECHNIQUE: Multidetector CT imaging of the abdomen and pelvis was performed using the standard protocol following bolus administration of intravenous contrast. RADIATION DOSE REDUCTION: This exam was performed according to the departmental dose-optimization program which includes automated exposure control, adjustment of the mA and/or kV according to patient size and/or use of iterative reconstruction technique. CONTRAST:  OMNIPAQUE  IOHEXOL  300 MG/ML  SOLN COMPARISON:  11/13/2023 FINDINGS: Lower Chest: No acute findings. Hepatobiliary: No suspicious hepatic masses identified. Gallbladder is unremarkable. No evidence of biliary ductal dilatation. Pancreas: No mass or inflammatory changes. Mild diffuse pancreatic ductal dilatation shows no significant change. Spleen: Within normal limits  in size. Multilobulated contour noted, but no  splenic masses identified. Adrenals/Urinary Tract: No suspicious masses identified. Mild bilateral renal parenchymal scarring is again seen. No evidence of ureteral calculi or hydronephrosis. Mild diffuse bladder wall thickening is unchanged and may be due to chronic cystitis or chronic bladder outlet obstruction. Stomach/Bowel: No evidence of obstruction, inflammatory process or abnormal fluid collections. Vascular/Lymphatic: No pathologically enlarged lymph nodes. Stable 11 mm left inguinal lymph node again noted. No acute vascular findings. Reproductive:  Stable mildly enlarged prostate. Other:  None. Musculoskeletal:  No suspicious bone lesions identified. IMPRESSION: No acute findings. Stable mildly enlarged prostate. Stable mild diffuse bladder wall thickening, which may be due to chronic cystitis or chronic bladder outlet obstruction. Suggest correlation with urinalysis. Electronically Signed   By: Norleen DELENA Kil M.D.   On: 02/23/2024 05:53     Procedures   Medications Ordered in the ED  0.9 %  sodium chloride  infusion (has no administration in time range)  cefTRIAXone  (ROCEPHIN ) 1 g in sodium chloride  0.9 % 100 mL IVPB (has no administration in time range)  tamsulosin (FLOMAX) capsule 0.4 mg (has no administration in time range)  ketorolac (TORADOL) 30 MG/ML injection 15 mg (has no administration in time range)  fentaNYL  (SUBLIMAZE ) injection 50 mcg (50 mcg Intravenous Given 02/23/24 0503)  ondansetron  (ZOFRAN ) injection 4 mg (4 mg Intravenous Given 02/23/24 0502)  iohexol  (OMNIPAQUE ) 300 MG/ML solution 100 mL (100 mLs Intravenous Contrast Given 02/23/24 0538)                                    Medical Decision Making Patient BIB EMS for emesis and abdominal pain x 4 days since drinking old milk   Amount and/or Complexity of Data Reviewed Independent Historian: EMS    Details: See above  External Data Reviewed: notes.    Details: Previous notes reviewed  Labs: ordered.    Details:  Urine is consistent with UTI.  Normal white count 4.6, normal hemoglobin 15.4, normal platelets. Normal lipase 12.  Normal sodium 142, normal potassium 4.1, normal creatine.   Radiology: ordered and independent interpretation performed.    Details: No SBO   Risk Prescription drug management. Risk Details: Well appearing.  No emesis in the ED.  Will treat for UTI and prostatic hypertrophy.  No signs of sepsis.  Stable for discharge with close follow up.  Strict returns.       Final diagnoses:  Lower urinary tract infectious disease  Enlarged prostate   No signs of systemic illness or infection. The patient is nontoxic-appearing on exam and vital signs are within normal limits.  I have reviewed the triage vital signs and the nursing notes. Pertinent labs & imaging results that were available during my care of the patient were reviewed by me and considered in my medical decision making (see chart for details). After history, exam, and medical workup I feel the patient has been appropriately medically screened and is safe for discharge home. Pertinent diagnoses were discussed with the patient. Patient was given return precautions.    ED Discharge Orders          Ordered    ondansetron  (ZOFRAN -ODT) 8 MG disintegrating tablet        02/23/24 0620    cephALEXin  (KEFLEX ) 500 MG capsule  4 times daily        02/23/24 0620  Braylinn Gulden, MD 02/23/24 [phone]

## 2024-02-23 NOTE — ED Triage Notes (Signed)
  Patient BIB EMS for abdominal pain and emesis that has been going on for 3 days after drinking expired milk.  Patient states he has had 3 episodes of emesis with last one yesterday afternoon.  Denies any diarrhea or fevers at home.  No OTC meds taken at home.  Pain 10/10, sharp.

## 2024-04-17 ENCOUNTER — Encounter: Payer: Self-pay | Admitting: Internal Medicine

## 2024-05-22 ENCOUNTER — Inpatient Hospital Stay: Admitting: Internal Medicine

## 2024-05-22 ENCOUNTER — Inpatient Hospital Stay
